# Patient Record
Sex: Female | Born: 1935 | Race: White | Hispanic: No | Marital: Married | Smoking: Never smoker
Health system: Southern US, Community
[De-identification: ages and names within clinical notes are randomized; demographics above are authoritative.]

## PROBLEM LIST (undated history)

## (undated) DIAGNOSIS — I340 Nonrheumatic mitral (valve) insufficiency: Secondary | ICD-10-CM

## (undated) DIAGNOSIS — I272 Pulmonary hypertension, unspecified: Secondary | ICD-10-CM

## (undated) DIAGNOSIS — Z9981 Dependence on supplemental oxygen: Secondary | ICD-10-CM

## (undated) DIAGNOSIS — K449 Diaphragmatic hernia without obstruction or gangrene: Secondary | ICD-10-CM

## (undated) DIAGNOSIS — K922 Gastrointestinal hemorrhage, unspecified: Secondary | ICD-10-CM

## (undated) DIAGNOSIS — I10 Essential (primary) hypertension: Secondary | ICD-10-CM

## (undated) DIAGNOSIS — B029 Zoster without complications: Secondary | ICD-10-CM

## (undated) DIAGNOSIS — J309 Allergic rhinitis, unspecified: Secondary | ICD-10-CM

## (undated) DIAGNOSIS — R011 Cardiac murmur, unspecified: Secondary | ICD-10-CM

## (undated) DIAGNOSIS — G8929 Other chronic pain: Secondary | ICD-10-CM

## (undated) DIAGNOSIS — M545 Low back pain, unspecified: Secondary | ICD-10-CM

## (undated) DIAGNOSIS — R809 Proteinuria, unspecified: Secondary | ICD-10-CM

## (undated) DIAGNOSIS — I739 Peripheral vascular disease, unspecified: Secondary | ICD-10-CM

## (undated) DIAGNOSIS — R200 Anesthesia of skin: Secondary | ICD-10-CM

## (undated) DIAGNOSIS — H04559 Acquired stenosis of unspecified nasolacrimal duct: Secondary | ICD-10-CM

## (undated) DIAGNOSIS — E785 Hyperlipidemia, unspecified: Secondary | ICD-10-CM

## (undated) DIAGNOSIS — M1990 Unspecified osteoarthritis, unspecified site: Secondary | ICD-10-CM

## (undated) DIAGNOSIS — I480 Paroxysmal atrial fibrillation: Secondary | ICD-10-CM

## (undated) DIAGNOSIS — Z8739 Personal history of other diseases of the musculoskeletal system and connective tissue: Secondary | ICD-10-CM

## (undated) DIAGNOSIS — R0602 Shortness of breath: Secondary | ICD-10-CM

## (undated) DIAGNOSIS — K529 Noninfective gastroenteritis and colitis, unspecified: Secondary | ICD-10-CM

## (undated) DIAGNOSIS — J189 Pneumonia, unspecified organism: Secondary | ICD-10-CM

## (undated) DIAGNOSIS — J449 Chronic obstructive pulmonary disease, unspecified: Secondary | ICD-10-CM

## (undated) DIAGNOSIS — I2510 Atherosclerotic heart disease of native coronary artery without angina pectoris: Secondary | ICD-10-CM

## (undated) DIAGNOSIS — Z8489 Family history of other specified conditions: Secondary | ICD-10-CM

## (undated) HISTORY — DX: Diaphragmatic hernia without obstruction or gangrene: K44.9

## (undated) HISTORY — DX: Anesthesia of skin: R20.0

## (undated) HISTORY — DX: Gastrointestinal hemorrhage, unspecified: K92.2

## (undated) HISTORY — PX: BACK SURGERY: SHX140

## (undated) HISTORY — PX: ABDOMINAL HYSTERECTOMY: SHX81

## (undated) HISTORY — DX: Nonrheumatic mitral (valve) insufficiency: I34.0

## (undated) HISTORY — DX: Paroxysmal atrial fibrillation: I48.0

## (undated) HISTORY — DX: Personal history of other diseases of the musculoskeletal system and connective tissue: Z87.39

## (undated) HISTORY — PX: TONSILLECTOMY AND ADENOIDECTOMY: SUR1326

## (undated) HISTORY — PX: EYE SURGERY: SHX253

## (undated) HISTORY — PX: CARDIAC CATHETERIZATION: SHX172

## (undated) HISTORY — DX: Zoster without complications: B02.9

## (undated) HISTORY — DX: Pulmonary hypertension, unspecified: I27.20

## (undated) HISTORY — PX: APPENDECTOMY: SHX54

## (undated) HISTORY — DX: Peripheral vascular disease, unspecified: I73.9

## (undated) HISTORY — DX: Allergic rhinitis, unspecified: J30.9

## (undated) HISTORY — DX: Proteinuria, unspecified: R80.9

## (undated) HISTORY — DX: Acquired stenosis of unspecified nasolacrimal duct: H04.559

## (undated) HISTORY — PX: LAPAROSCOPIC CHOLECYSTECTOMY: SUR755

## (undated) HISTORY — PX: LUMBAR DISC SURGERY: SHX700

## (undated) HISTORY — PX: CATARACT EXTRACTION, BILATERAL: SHX1313

---

## 1999-04-27 ENCOUNTER — Other Ambulatory Visit: Admission: RE | Admit: 1999-04-27 | Discharge: 1999-04-27 | Payer: Self-pay | Admitting: *Deleted

## 2000-06-03 ENCOUNTER — Other Ambulatory Visit: Admission: RE | Admit: 2000-06-03 | Discharge: 2000-06-03 | Payer: Self-pay | Admitting: *Deleted

## 2001-03-28 ENCOUNTER — Encounter: Admission: RE | Admit: 2001-03-28 | Discharge: 2001-03-28 | Payer: Self-pay | Admitting: Family Medicine

## 2001-03-28 ENCOUNTER — Encounter: Payer: Self-pay | Admitting: Family Medicine

## 2001-04-03 ENCOUNTER — Encounter: Payer: Self-pay | Admitting: Family Medicine

## 2001-04-03 ENCOUNTER — Encounter: Admission: RE | Admit: 2001-04-03 | Discharge: 2001-04-03 | Payer: Self-pay | Admitting: Family Medicine

## 2001-04-07 ENCOUNTER — Encounter: Admission: RE | Admit: 2001-04-07 | Discharge: 2001-04-07 | Payer: Self-pay | Admitting: Family Medicine

## 2001-04-07 ENCOUNTER — Encounter: Payer: Self-pay | Admitting: Family Medicine

## 2001-07-11 ENCOUNTER — Other Ambulatory Visit: Admission: RE | Admit: 2001-07-11 | Discharge: 2001-07-11 | Payer: Self-pay | Admitting: *Deleted

## 2001-11-01 ENCOUNTER — Encounter: Payer: Self-pay | Admitting: Family Medicine

## 2001-11-01 ENCOUNTER — Encounter: Admission: RE | Admit: 2001-11-01 | Discharge: 2001-11-01 | Payer: Self-pay | Admitting: Family Medicine

## 2001-11-15 ENCOUNTER — Encounter: Admission: RE | Admit: 2001-11-15 | Discharge: 2001-11-15 | Payer: Self-pay | Admitting: Family Medicine

## 2001-11-15 ENCOUNTER — Encounter: Payer: Self-pay | Admitting: Family Medicine

## 2002-07-31 ENCOUNTER — Encounter: Payer: Self-pay | Admitting: Family Medicine

## 2002-07-31 ENCOUNTER — Encounter: Admission: RE | Admit: 2002-07-31 | Discharge: 2002-07-31 | Payer: Self-pay | Admitting: Family Medicine

## 2003-04-24 ENCOUNTER — Ambulatory Visit (HOSPITAL_COMMUNITY): Admission: RE | Admit: 2003-04-24 | Discharge: 2003-04-24 | Payer: Self-pay | Admitting: Gastroenterology

## 2003-05-15 ENCOUNTER — Encounter: Payer: Self-pay | Admitting: Family Medicine

## 2003-05-15 ENCOUNTER — Encounter: Admission: RE | Admit: 2003-05-15 | Discharge: 2003-05-15 | Payer: Self-pay | Admitting: Family Medicine

## 2003-08-13 ENCOUNTER — Encounter: Payer: Self-pay | Admitting: Family Medicine

## 2003-08-13 ENCOUNTER — Encounter: Admission: RE | Admit: 2003-08-13 | Discharge: 2003-08-13 | Payer: Self-pay | Admitting: Family Medicine

## 2003-11-05 ENCOUNTER — Other Ambulatory Visit: Admission: RE | Admit: 2003-11-05 | Discharge: 2003-11-05 | Payer: Self-pay | Admitting: Obstetrics and Gynecology

## 2004-09-11 ENCOUNTER — Encounter: Admission: RE | Admit: 2004-09-11 | Discharge: 2004-09-11 | Payer: Self-pay | Admitting: Family Medicine

## 2005-04-29 ENCOUNTER — Encounter: Admission: RE | Admit: 2005-04-29 | Discharge: 2005-04-29 | Payer: Self-pay | Admitting: Family Medicine

## 2005-11-23 ENCOUNTER — Other Ambulatory Visit: Admission: RE | Admit: 2005-11-23 | Discharge: 2005-11-23 | Payer: Self-pay | Admitting: Obstetrics and Gynecology

## 2005-11-25 ENCOUNTER — Encounter: Admission: RE | Admit: 2005-11-25 | Discharge: 2005-11-25 | Payer: Self-pay | Admitting: Family Medicine

## 2006-05-05 ENCOUNTER — Encounter: Admission: RE | Admit: 2006-05-05 | Discharge: 2006-05-05 | Payer: Self-pay | Admitting: Family Medicine

## 2006-11-22 ENCOUNTER — Encounter: Admission: RE | Admit: 2006-11-22 | Discharge: 2006-11-22 | Payer: Self-pay | Admitting: Family Medicine

## 2006-12-01 ENCOUNTER — Encounter: Admission: RE | Admit: 2006-12-01 | Discharge: 2006-12-01 | Payer: Self-pay | Admitting: Obstetrics and Gynecology

## 2007-04-07 ENCOUNTER — Encounter: Admission: RE | Admit: 2007-04-07 | Discharge: 2007-04-07 | Payer: Self-pay | Admitting: Family Medicine

## 2008-01-04 ENCOUNTER — Encounter: Admission: RE | Admit: 2008-01-04 | Discharge: 2008-01-04 | Payer: Self-pay | Admitting: Obstetrics and Gynecology

## 2008-09-25 ENCOUNTER — Emergency Department (HOSPITAL_COMMUNITY): Admission: EM | Admit: 2008-09-25 | Discharge: 2008-09-25 | Payer: Self-pay | Admitting: Emergency Medicine

## 2008-09-30 ENCOUNTER — Encounter: Admission: RE | Admit: 2008-09-30 | Discharge: 2008-09-30 | Payer: Self-pay | Admitting: Family Medicine

## 2008-12-14 ENCOUNTER — Encounter: Admission: RE | Admit: 2008-12-14 | Discharge: 2008-12-14 | Payer: Self-pay | Admitting: Family Medicine

## 2009-01-19 ENCOUNTER — Inpatient Hospital Stay (HOSPITAL_COMMUNITY): Admission: EM | Admit: 2009-01-19 | Discharge: 2009-01-21 | Payer: Self-pay | Admitting: Emergency Medicine

## 2009-10-29 ENCOUNTER — Encounter: Admission: RE | Admit: 2009-10-29 | Discharge: 2009-10-29 | Payer: Self-pay | Admitting: Obstetrics and Gynecology

## 2010-12-27 ENCOUNTER — Encounter: Payer: Self-pay | Admitting: Obstetrics and Gynecology

## 2011-03-23 LAB — COMPREHENSIVE METABOLIC PANEL
ALT: 15 U/L (ref 0–35)
AST: 21 U/L (ref 0–37)
Albumin: 3.3 g/dL — ABNORMAL LOW (ref 3.5–5.2)
Alkaline Phosphatase: 57 U/L (ref 39–117)
BUN: 11 mg/dL (ref 6–23)
CO2: 27 mEq/L (ref 19–32)
Calcium: 8.8 mg/dL (ref 8.4–10.5)
Chloride: 104 mEq/L (ref 96–112)
Creatinine, Ser: 0.67 mg/dL (ref 0.4–1.2)
GFR calc Af Amer: >60 mL/min (ref >60)
GFR calc non Af Amer: >60 mL/min (ref >60)
Glucose, Bld: 100 mg/dL — ABNORMAL HIGH (ref 70–99)
Potassium: 3.8 mEq/L (ref 3.5–5.1)
Sodium: 139 mEq/L (ref 135–145)
Total Bilirubin: 0.9 mg/dL (ref 0.3–1.2)
Total Protein: 5.7 g/dL — ABNORMAL LOW (ref 6.0–8.3)

## 2011-03-23 LAB — URINALYSIS, ROUTINE W REFLEX MICROSCOPIC
Bilirubin Urine: NEGATIVE
Glucose, UA: NEGATIVE mg/dL
Hgb urine dipstick: NEGATIVE
Ketones, ur: NEGATIVE mg/dL
Nitrite: NEGATIVE
Protein, ur: NEGATIVE mg/dL
Specific Gravity, Urine: 1.013 (ref 1.005–1.030)
Urobilinogen, UA: 1 mg/dL (ref 0.0–1.0)
pH: 6.5 (ref 5.0–8.0)

## 2011-03-23 LAB — D-DIMER, QUANTITATIVE (NOT AT ARMC): D-Dimer, Quant: 0.3 ug/mL-FEU (ref 0.00–0.48)

## 2011-03-23 LAB — CBC
HCT: 39.8 % (ref 36.0–46.0)
Hemoglobin: 13.9 g/dL (ref 12.0–15.0)
MCHC: 34.9 g/dL (ref 30.0–36.0)
MCV: 97.1 fL (ref 78.0–100.0)
Platelets: 193 10*3/uL (ref 150–400)
RBC: 4.1 MIL/uL (ref 3.87–5.11)
RDW: 13.9 % (ref 11.5–15.5)
WBC: 10.3 10*3/uL (ref 4.0–10.5)

## 2011-03-23 LAB — DIFFERENTIAL
Basophils Absolute: 0 10*3/uL (ref 0.0–0.1)
Basophils Relative: 0 % (ref 0–1)
Eosinophils Absolute: 0.2 10*3/uL (ref 0.0–0.7)
Eosinophils Relative: 2 % (ref 0–5)
Lymphocytes Relative: 22 % (ref 12–46)
Lymphs Abs: 2.3 10*3/uL (ref 0.7–4.0)
Monocytes Absolute: 0.5 10*3/uL (ref 0.1–1.0)
Monocytes Relative: 5 % (ref 3–12)
Neutro Abs: 7.3 10*3/uL (ref 1.7–7.7)
Neutrophils Relative %: 71 % (ref 43–77)

## 2011-03-23 LAB — POCT I-STAT, CHEM 8
BUN: 21 mg/dL (ref 6–23)
Calcium, Ion: 1.13 mmol/L (ref 1.12–1.32)
Chloride: 104 mEq/L (ref 96–112)
Creatinine, Ser: 0.8 mg/dL (ref 0.4–1.2)
Glucose, Bld: 118 mg/dL — ABNORMAL HIGH (ref 70–99)
HCT: 43 % (ref 36.0–46.0)
Hemoglobin: 14.6 g/dL (ref 12.0–15.0)
Potassium: 3.8 mEq/L (ref 3.5–5.1)
Sodium: 138 mEq/L (ref 135–145)
TCO2: 27 mmol/L (ref 0–100)

## 2011-03-23 LAB — MAGNESIUM: Magnesium: 2 mg/dL (ref 1.5–2.5)

## 2011-03-23 LAB — BASIC METABOLIC PANEL
BUN: 16 mg/dL (ref 6–23)
CO2: 25 mEq/L (ref 19–32)
Calcium: 9.6 mg/dL (ref 8.4–10.5)
Chloride: 101 mEq/L (ref 96–112)
Creatinine, Ser: 0.7 mg/dL (ref 0.4–1.2)
GFR calc Af Amer: >60 mL/min (ref >60)
GFR calc non Af Amer: >60 mL/min (ref >60)
Glucose, Bld: 124 mg/dL — ABNORMAL HIGH (ref 70–99)
Potassium: 3.8 mEq/L (ref 3.5–5.1)
Sodium: 136 mEq/L (ref 135–145)

## 2011-03-23 LAB — CK TOTAL AND CKMB (NOT AT ARMC)
CK, MB: 1.1 ng/mL (ref 0.3–4.0)
Relative Index: INVALID (ref 0.0–2.5)
Total CK: 49 U/L (ref 7–177)

## 2011-03-23 LAB — CARDIAC PANEL(CRET KIN+CKTOT+MB+TROPI)
CK, MB: 1.1 ng/mL (ref 0.3–4.0)
CK, MB: 1.2 ng/mL (ref 0.3–4.0)
CK, MB: 1.1 ng/mL (ref 0.3–4.0)
Relative Index: INVALID (ref 0.0–2.5)
Relative Index: INVALID (ref 0.0–2.5)
Relative Index: INVALID (ref 0.0–2.5)
Total CK: 43 U/L (ref 7–177)
Total CK: 54 U/L (ref 7–177)
Total CK: 52 U/L (ref 7–177)
Troponin I: <0.01 ng/mL (ref 0.00–0.06)
Troponin I: <0.01 ng/mL (ref 0.00–0.06)
Troponin I: <0.01 ng/mL (ref 0.00–0.06)

## 2011-03-23 LAB — POCT CARDIAC MARKERS
CKMB, poc: <1 ng/mL — ABNORMAL LOW (ref 1.0–8.0)
CKMB, poc: <1 ng/mL — ABNORMAL LOW (ref 1.0–8.0)
Myoglobin, poc: 43.5 ng/mL (ref 12–200)
Myoglobin, poc: 45.4 ng/mL (ref 12–200)
Troponin i, poc: <0.05 ng/mL (ref 0.00–0.09)
Troponin i, poc: <0.05 ng/mL (ref 0.00–0.09)

## 2011-03-23 LAB — URINE CULTURE
Colony Count: 15000
Special Requests: NEGATIVE

## 2011-03-23 LAB — TROPONIN I: Troponin I: 0.01 ng/mL (ref 0.00–0.06)

## 2011-04-20 NOTE — H&P (Signed)
Sydney Gross, Sydney Gross             ACCOUNT NO.:  [id number]   MEDICAL RECORD NO.:  [id number]          PATIENT TYPE:  INP   LOCATION:  1823                         FACILITY:  [facility]   PHYSICIAN:  [name], M.D.DATE OF BIRTH:  24-Jul-1930   DATE OF ADMISSION:  01/19/2009  DATE OF DISCHARGE:                              HISTORY & PHYSICAL   PRIMARY CARE PHYSICIAN:  Dr. Donia Gross.   CHIEF COMPLAINT:  Chest pain and dizziness.   HISTORY OF PRESENT ILLNESS:  The patient is a 77 year old white female  with past medical history significant for hypertension, hyperlipidemia  who presents with above complaints.  She states that on the day prior to  admission she began having dizziness - described as a feeling of  unsteadiness that it is precipitated by turning her head.  She had  multiple episodes, about 10 episodes of nausea and vomiting, nonbloody,  throughout the day.  She had one episode of loose stool.  She denies  abdominal pain, dysuria, fevers, cough, melena and no hematochezia.  She  denies focal weakness, tinnitus, headaches, and no blurry vision.  She  also had palpitations, feeling like her heart was fluttering and  shortness of breath with exertion on the day prior to admission.  This  morning she states that she had three bites of oatmeal, and after  swallowing it felt like it just got stuck somewhere in her mid chest  area, and she had associated chest pain lasting about 15-20 minutes.  She was given nitroglycerin and following this the pain completely  resolved.  She also vomited following this episode.  She denies  radiation, and no paresthesias.  She states that she had a stress test  done likely by an [facility] cardiologist about 10-15 years ago and she was  told that it was normal.   Upon arrival in the ED she had an EKG which did not show any ischemic  changes, her point of care markers negative x1, and a chest x-ray was  done which shows no acute  cardiopulmonary disease, changes compatible  with COPD, and also scoliosis noted.  She is admitted for further  evaluation and management.   PAST MEDICAL HISTORY:  1. As above.  2. Arthritis.   MEDICATIONS:  1. Lipitor 40 mg p.o. daily.  2. Zetia 10 mg p.o. daily.  3. Metoprolol 25 mg daily.  4. Vitamin D 1000 units.  5. Skelaxin 800 mg t.i.d.  6. Welchol.   ALLERGIES:  CODEINE.   FAMILY HISTORY:  She denies tobacco, occasional cocktails.   FAMILY HISTORY:  Her mother had a stroke and her brother is deceased,  had liver and kidney cancer.  No family history of heart disease.   REVIEW OF SYSTEMS:  As per HPI, other review of systems negative.   PHYSICAL EXAM:  GENERAL:  The patient is a pleasant elderly white  female.  She is alert and oriented, in no apparent distress.  VITAL SIGNS: Her temperature is 98.3, blood pressure is 166/72,  initially 171/90, pulse is 73, respiratory rate is 22, O2 sat 99%.  HEENT: PERRL, EOMI, sclerae  anicteric, moist mucous membranes and no  oral exudates.  NECK:  Supple, no adenopathy and no thyromegaly, no JVD and no carotid  bruits appreciated.  LUNGS:  Clear to auscultation bilaterally.  No crackles or wheezes.  CARDIOVASCULAR: Regular rate and rhythm.  Normal S1-S2.  ABDOMEN:  Soft, bowel sounds present, nontender, nondistended.  No  organomegaly and no masses palpable.  EXTREMITIES:  No cyanosis and no edema.  NEURO:  She is alert and oriented x3.  Cranial nerves II-XII are grossly  intact.  Her strength is 4+/5 and symmetric, nonfocal exam.   LABORATORY DATA:  As per HPI.  Also her sodium is 138 with a potassium  of 3.8, chloride 104, BUN 21, creatinine 0.8, glucose is 118, ionized  calcium is 1.13. White cell count is 10.3 with a hemoglobin of 13.90,  hematocrit of 39.8.  The MCV is 97.1, platelet count 193, magnesium is  2.   ASSESSMENT AND PLAN:  1. Chest pain - as discussed above, gastrointestinal versus cardiac.      Will obtain  serial cardiac enzymes, also a barium swallow. We will      place on aspirin, nitroglycerin and a proton pump inhibitor.      Continue beta blocker.  Follow and consider cardiology consult      pending enzymes.  We will also monitor on telemetry for      arrhythmias.  2. ? Dysphagia/odynophagia - as discussed above.  Obtain a barium      swallow.  Consider a GI consult pending results.  3. Dizziness - described as unsteadiness with turning of her head.      Will obtain orthostatic vital signs, check CT scan of her head also      serial cardiac enzymes as above.  Antivert p.r.n.  For now, follow      and consult physical therapy for possible vestibular PT.  4. Hypertension - continue beta blocker.      [name], M.D.  Electronically Signed     ACV/MEDQ  D:  01/19/2009  T:  01/19/2009  Job:  [id number]   cc:   Sydney Gross, M.D.  Sydney Gross, M.D.

## 2011-04-23 NOTE — Op Note (Signed)
NAMECITLALLY, CAPTAIN                       ACCOUNT NO.:  [id number]   MEDICAL RECORD NO.:  [id number]                   PATIENT TYPE:  AMB   LOCATION:  ENDO                                 FACILITY:  [facility]   PHYSICIAN:  Anselmo Rod, M.D.               DATE OF BIRTH:  1930-03-26   DATE OF PROCEDURE:  04/24/2003  DATE OF DISCHARGE:                                 OPERATIVE REPORT   PROCEDURE PERFORMED:  Screening colonoscopy.   ENDOSCOPIST:  Anselmo Rod, M.D.   INSTRUMENT USED:  Pediatric adjustable Olympus colonoscope.   INDICATIONS FOR PROCEDURE:  A 77 year old white female undergoing screening  colonoscopy.  Rule out colonic polyps, masses, etc.   PREPROCEDURE PREPARATION:  Informed consent was procured from the patient.  The patient was fasted for eight hours prior to the procedure and prepped  with a bottle of MiraLax and Gatorade the night prior to the procedure.   PREPROCEDURE PHYSICAL:  VITAL SIGNS:  The patient had stable vital signs.  NECK:  Supple.  CHEST:  Clear to auscultation.  S1 and S2 regular.  ABDOMEN:  Soft with normal bowel sounds.   DESCRIPTION OF PROCEDURE:  The patient was placed in the left lateral  decubitus position and sedated with 80 mg of Demerol and 8 mg of Versed  intravenously.  Once the patient was adequately sedated and maintained on low flow oxygen  and continuous cardiac monitoring, the Olympus video colonoscope was  advanced from the rectum to the cecum without difficulty.  The entire  colonic mucosa appeared healthy with normal vascular pattern.  No masses,  polyps, erosions, ulcerations or diverticular were noted.  The appendiceal  orifice and ileocecal valve appeared healthy and were photographed.  The  terminal ileum was viewed and seemed normal.  Retroflexion in the rectum  revealed small nonbleeding internal hemorrhoids.   The patient dropped her oxygen saturation after the procedure was completed  and therefore her O2  was raised to 5 liters by nasal cannula and she was  given Romazicon 0.4 mg to reverse her sedation.  She responded well to the  Romazicon and was awakened appropriately responsive before she was brought  to the recovery area.  The patient tolerated the procedure well without  complications.   IMPRESSION:  Normal colonoscopy up to the terminal ileum except for small  nonbleeding internal hemorrhoids.   RECOMMENDATIONS:  1. Repeat colorectal cancer screening is recommended in next ten years     unless the patient develops any abnormal     symptoms in the interim.  2. High fiber diet with liberal fluid intake has been advised.  3. Outpatient follow up on a p.r.n. basis.  Anselmo Rod, M.D.    JNM/MEDQ  D:  04/24/2003  T:  04/24/2003  Job:  [id number]   cc:   Marcelino Duster L. Vincente Poli, M.D.  [address] [place]  [place] [address]  Fax: [phone]   Donia Guiles, M.D.  [address] [place]  [place] [address]  Fax: [phone]

## 2011-09-07 LAB — POCT I-STAT, CHEM 8
BUN: 9
Calcium, Ion: 1.19
Chloride: 103
Creatinine, Ser: 1
Glucose, Bld: 99
HCT: 39
Hemoglobin: 13.3
Potassium: 3.5
Sodium: 136
TCO2: 25

## 2011-09-07 LAB — URINALYSIS, ROUTINE W REFLEX MICROSCOPIC
Glucose, UA: NEGATIVE
Hgb urine dipstick: NEGATIVE
Ketones, ur: 40 — AB
Leukocytes, UA: NEGATIVE
Nitrite: NEGATIVE
Protein, ur: NEGATIVE
Specific Gravity, Urine: 1.029
Urobilinogen, UA: 0.2
pH: 6

## 2012-06-23 ENCOUNTER — Other Ambulatory Visit: Payer: Self-pay | Admitting: Family Medicine

## 2012-06-23 ENCOUNTER — Ambulatory Visit
Admission: RE | Admit: 2012-06-23 | Discharge: 2012-06-23 | Disposition: A | Discharge status: Home / Self Care | Payer: Medicare Other | Source: Ambulatory Visit | Attending: Family Medicine | Admitting: Family Medicine

## 2012-06-23 DIAGNOSIS — R519 Headache, unspecified: Secondary | ICD-10-CM

## 2012-06-23 DIAGNOSIS — M542 Cervicalgia: Secondary | ICD-10-CM

## 2012-06-23 MED ORDER — IOHEXOL 300 MG/ML  SOLN
75.0000 mL | Freq: Once | INTRAMUSCULAR | Status: AC | PRN
  Administered 2012-06-23: 75 mL via INTRAVENOUS

## 2012-09-13 ENCOUNTER — Other Ambulatory Visit (HOSPITAL_COMMUNITY): Payer: Self-pay | Admitting: Family Medicine

## 2012-09-13 DIAGNOSIS — R0602 Shortness of breath: Secondary | ICD-10-CM

## 2012-09-19 ENCOUNTER — Ambulatory Visit (HOSPITAL_COMMUNITY)
Admission: RE | Admit: 2012-09-19 | Discharge: 2012-09-19 | Disposition: A | Discharge status: Home / Self Care | Payer: Medicare Other | Source: Ambulatory Visit | Attending: Family Medicine | Admitting: Family Medicine

## 2012-09-19 DIAGNOSIS — R0602 Shortness of breath: Secondary | ICD-10-CM | POA: Insufficient documentation

## 2012-09-19 MED ORDER — ALBUTEROL SULFATE (5 MG/ML) 0.5% IN NEBU
2.5000 mg | INHALATION_SOLUTION | Freq: Once | RESPIRATORY_TRACT | Status: AC
  Administered 2012-09-19: 2.5 mg via RESPIRATORY_TRACT

## 2013-09-07 ENCOUNTER — Telehealth: Payer: Self-pay | Admitting: Interventional Cardiology

## 2013-09-07 DIAGNOSIS — E785 Hyperlipidemia, unspecified: Secondary | ICD-10-CM

## 2013-09-07 NOTE — Telephone Encounter (Signed)
New problem   Calling regarding lab test results.

## 2013-09-16 NOTE — Telephone Encounter (Signed)
LDL particle number >3500; small LDL particles >2300; particle size <20. Therefore recommended therapy with Crestor 5 mg daily. Repeat Lipomed analysis/Lipid NMR in 6-8weeks

## 2013-09-18 MED ORDER — ROSUVASTATIN CALCIUM 5 MG PO TABS: 5.0000 mg | ORAL_TABLET | Freq: Every day | ORAL | Status: DC

## 2013-09-18 NOTE — Telephone Encounter (Signed)
Pt given labs results and Dr.Smith instructions to start Crestor 5g by Orlene Plum, CMA. Pt sts that she is worried about leg cramps but she will try

## 2013-12-05 DIAGNOSIS — A0472 Enterocolitis due to Clostridium difficile, not specified as recurrent: Secondary | ICD-10-CM | POA: Insufficient documentation

## 2013-12-06 DIAGNOSIS — K529 Noninfective gastroenteritis and colitis, unspecified: Secondary | ICD-10-CM

## 2013-12-06 HISTORY — DX: Noninfective gastroenteritis and colitis, unspecified: K52.9

## 2013-12-09 ENCOUNTER — Encounter (HOSPITAL_COMMUNITY): Payer: Self-pay | Admitting: Emergency Medicine

## 2013-12-09 ENCOUNTER — Inpatient Hospital Stay (HOSPITAL_COMMUNITY)
Admission: EM | Admit: 2013-12-09 | Discharge: 2013-12-17 | DRG: 372 | Disposition: A | Discharge status: Home / Self Care | Payer: Medicare Other | Attending: Internal Medicine | Admitting: Internal Medicine

## 2013-12-09 ENCOUNTER — Emergency Department (HOSPITAL_COMMUNITY)
Admission: EM | Admit: 2013-12-09 | Discharge: 2013-12-09 | Disposition: A | Discharge status: Nursing Home (Includes ICF) | Payer: Medicare Other | Source: Home / Self Care | Attending: Family Medicine | Admitting: Family Medicine

## 2013-12-09 ENCOUNTER — Emergency Department (HOSPITAL_COMMUNITY): Payer: Medicare Other

## 2013-12-09 DIAGNOSIS — M5380 Other specified dorsopathies, site unspecified: Secondary | ICD-10-CM | POA: Diagnosis present

## 2013-12-09 DIAGNOSIS — K5289 Other specified noninfective gastroenteritis and colitis: Secondary | ICD-10-CM

## 2013-12-09 DIAGNOSIS — Z889 Allergy status to unspecified drugs, medicaments and biological substances status: Secondary | ICD-10-CM

## 2013-12-09 DIAGNOSIS — R059 Cough, unspecified: Secondary | ICD-10-CM

## 2013-12-09 DIAGNOSIS — M19019 Primary osteoarthritis, unspecified shoulder: Secondary | ICD-10-CM | POA: Diagnosis present

## 2013-12-09 DIAGNOSIS — Z7982 Long term (current) use of aspirin: Secondary | ICD-10-CM

## 2013-12-09 DIAGNOSIS — K529 Noninfective gastroenteritis and colitis, unspecified: Secondary | ICD-10-CM | POA: Diagnosis present

## 2013-12-09 DIAGNOSIS — Z79899 Other long term (current) drug therapy: Secondary | ICD-10-CM

## 2013-12-09 DIAGNOSIS — E8779 Other fluid overload: Secondary | ICD-10-CM | POA: Diagnosis not present

## 2013-12-09 DIAGNOSIS — R109 Unspecified abdominal pain: Secondary | ICD-10-CM

## 2013-12-09 DIAGNOSIS — R609 Edema, unspecified: Secondary | ICD-10-CM

## 2013-12-09 DIAGNOSIS — R600 Localized edema: Secondary | ICD-10-CM

## 2013-12-09 DIAGNOSIS — E876 Hypokalemia: Secondary | ICD-10-CM

## 2013-12-09 DIAGNOSIS — I10 Essential (primary) hypertension: Secondary | ICD-10-CM

## 2013-12-09 DIAGNOSIS — IMO0002 Reserved for concepts with insufficient information to code with codable children: Secondary | ICD-10-CM | POA: Diagnosis present

## 2013-12-09 DIAGNOSIS — E440 Moderate protein-calorie malnutrition: Secondary | ICD-10-CM

## 2013-12-09 DIAGNOSIS — R05 Cough: Secondary | ICD-10-CM

## 2013-12-09 DIAGNOSIS — B961 Klebsiella pneumoniae [K. pneumoniae] as the cause of diseases classified elsewhere: Secondary | ICD-10-CM | POA: Diagnosis present

## 2013-12-09 DIAGNOSIS — A0472 Enterocolitis due to Clostridium difficile, not specified as recurrent: Secondary | ICD-10-CM

## 2013-12-09 DIAGNOSIS — E785 Hyperlipidemia, unspecified: Secondary | ICD-10-CM

## 2013-12-09 DIAGNOSIS — N390 Urinary tract infection, site not specified: Secondary | ICD-10-CM

## 2013-12-09 DIAGNOSIS — R188 Other ascites: Secondary | ICD-10-CM | POA: Diagnosis not present

## 2013-12-09 HISTORY — DX: Hyperlipidemia, unspecified: E78.5

## 2013-12-09 HISTORY — DX: Shortness of breath: R06.02

## 2013-12-09 HISTORY — DX: Noninfective gastroenteritis and colitis, unspecified: K52.9

## 2013-12-09 HISTORY — DX: Unspecified osteoarthritis, unspecified site: M19.90

## 2013-12-09 HISTORY — DX: Family history of other specified conditions: Z84.89

## 2013-12-09 HISTORY — DX: Essential (primary) hypertension: I10

## 2013-12-09 HISTORY — DX: Chronic obstructive pulmonary disease, unspecified: J44.9

## 2013-12-09 LAB — CBC WITH DIFFERENTIAL/PLATELET
Basophils Absolute: 0 10*3/uL (ref 0.0–0.1)
Basophils Relative: 0 % (ref 0–1)
Eosinophils Absolute: 0.1 10*3/uL (ref 0.0–0.7)
Eosinophils Relative: 1 % (ref 0–5)
HCT: 43.3 % (ref 36.0–46.0)
Hemoglobin: 14.5 g/dL (ref 12.0–15.0)
Lymphocytes Relative: 15 % (ref 12–46)
Lymphs Abs: 2.7 10*3/uL (ref 0.7–4.0)
MCH: 32.4 pg (ref 26.0–34.0)
MCHC: 33.5 g/dL (ref 30.0–36.0)
MCV: 96.9 fL (ref 78.0–100.0)
Monocytes Absolute: 1.3 10*3/uL — ABNORMAL HIGH (ref 0.1–1.0)
Monocytes Relative: 7 % (ref 3–12)
Neutro Abs: 13.6 10*3/uL — ABNORMAL HIGH (ref 1.7–7.7)
Neutrophils Relative %: 77 % (ref 43–77)
Platelets: 313 10*3/uL (ref 150–400)
RBC: 4.47 MIL/uL (ref 3.87–5.11)
RDW: 12.8 % (ref 11.5–15.5)
WBC: 17.7 10*3/uL — ABNORMAL HIGH (ref 4.0–10.5)

## 2013-12-09 LAB — COMPREHENSIVE METABOLIC PANEL
ALT: 9 U/L (ref 0–35)
AST: 12 U/L (ref 0–37)
Albumin: 3.3 g/dL — ABNORMAL LOW (ref 3.5–5.2)
Alkaline Phosphatase: 88 U/L (ref 39–117)
BUN: 15 mg/dL (ref 6–23)
CO2: 26 mEq/L (ref 19–32)
Calcium: 9 mg/dL (ref 8.4–10.5)
Chloride: 96 mEq/L (ref 96–112)
Creatinine, Ser: 0.72 mg/dL (ref 0.50–1.10)
GFR calc Af Amer: >90 mL/min (ref >90)
GFR calc non Af Amer: 78 mL/min — ABNORMAL LOW (ref >90)
Glucose, Bld: 111 mg/dL — ABNORMAL HIGH (ref 70–99)
Potassium: 4.2 mEq/L (ref 3.7–5.3)
Sodium: 135 mEq/L — ABNORMAL LOW (ref 137–147)
Total Bilirubin: 0.3 mg/dL (ref 0.3–1.2)
Total Protein: 6.9 g/dL (ref 6.0–8.3)

## 2013-12-09 LAB — URINALYSIS, ROUTINE W REFLEX MICROSCOPIC
Glucose, UA: NEGATIVE mg/dL
Ketones, ur: 15 mg/dL — AB
Nitrite: POSITIVE — AB
Protein, ur: 30 mg/dL — AB
Specific Gravity, Urine: 1.029 (ref 1.005–1.030)
Urobilinogen, UA: 0.2 mg/dL (ref 0.0–1.0)
pH: 5.5 (ref 5.0–8.0)

## 2013-12-09 LAB — URINE MICROSCOPIC-ADD ON

## 2013-12-09 MED ORDER — IOHEXOL 300 MG/ML  SOLN
20.0000 mL | INTRAMUSCULAR | Status: AC
  Administered 2013-12-09: 25 mL via ORAL

## 2013-12-09 MED ORDER — MORPHINE SULFATE 2 MG/ML IJ SOLN
1.0000 mg | INTRAMUSCULAR | Status: DC | PRN
  Administered 2013-12-10 (×2): 1 mg via INTRAVENOUS
  Administered 2013-12-10 – 2013-12-13 (×8): 2 mg via INTRAVENOUS
  Administered 2013-12-14: 1 mg via INTRAVENOUS
  Administered 2013-12-14: 2 mg via INTRAVENOUS
  Administered 2013-12-15 (×2): 1 mg via INTRAVENOUS
  Administered 2013-12-15 – 2013-12-16 (×2): 2 mg via INTRAVENOUS
  Administered 2013-12-16: 1 mg via INTRAVENOUS
  Filled 2013-12-09 (×17): qty 1

## 2013-12-09 MED ORDER — CIPROFLOXACIN IN D5W 400 MG/200ML IV SOLN
400.0000 mg | Freq: Once | INTRAVENOUS | Status: AC
  Administered 2013-12-09: 400 mg via INTRAVENOUS
  Filled 2013-12-09: qty 200

## 2013-12-09 MED ORDER — SODIUM CHLORIDE 0.9 % IV BOLUS (SEPSIS)
1000.0000 mL | Freq: Once | INTRAVENOUS | Status: AC
  Administered 2013-12-09: 1000 mL via INTRAVENOUS

## 2013-12-09 MED ORDER — ONDANSETRON HCL 4 MG PO TABS: 4.0000 mg | ORAL_TABLET | Freq: Four times a day (QID) | ORAL | Status: DC | PRN

## 2013-12-09 MED ORDER — PANTOPRAZOLE SODIUM 40 MG IV SOLR
40.0000 mg | INTRAVENOUS | Status: DC
  Administered 2013-12-09 – 2013-12-10 (×2): 40 mg via INTRAVENOUS
  Filled 2013-12-09 (×4): qty 40

## 2013-12-09 MED ORDER — IOHEXOL 300 MG/ML  SOLN
100.0000 mL | Freq: Once | INTRAMUSCULAR | Status: AC | PRN
  Administered 2013-12-09: 100 mL via INTRAVENOUS

## 2013-12-09 MED ORDER — SODIUM CHLORIDE 0.9 % IV SOLN
INTRAVENOUS | Status: DC
Start: 2013-12-09 — End: 2013-12-10
  Administered 2013-12-09 – 2013-12-10 (×2): via INTRAVENOUS

## 2013-12-09 MED ORDER — DIPHENHYDRAMINE HCL 50 MG/ML IJ SOLN
25.0000 mg | Freq: Once | INTRAMUSCULAR | Status: AC
  Administered 2013-12-09: 25 mg via INTRAVENOUS
  Filled 2013-12-09: qty 1

## 2013-12-09 MED ORDER — METRONIDAZOLE IN NACL 5-0.79 MG/ML-% IV SOLN
500.0000 mg | Freq: Once | INTRAVENOUS | Status: AC
  Administered 2013-12-09: 500 mg via INTRAVENOUS
  Filled 2013-12-09: qty 100

## 2013-12-09 MED ORDER — ONDANSETRON HCL 4 MG/2ML IJ SOLN
4.0000 mg | Freq: Four times a day (QID) | INTRAMUSCULAR | Status: DC | PRN
  Administered 2013-12-10 – 2013-12-12 (×7): 4 mg via INTRAVENOUS
  Filled 2013-12-09 (×7): qty 2

## 2013-12-09 NOTE — ED Notes (Addendum)
Pt has redness localized to area above IV site. No infiltration noted, pt sts it is itchy as well. Not warm to touch. Dr. Ethelda ChickJacubowitz and pharmacy made aware. Per pharmacy slow rate after benadryl given. Rate changed to 12800ml/hr. Pt. Informed to let RN know if worsening symptoms.    Patient made aware of need for stool sample, importance of hand washing and Contact Precautions.

## 2013-12-09 NOTE — ED Notes (Signed)
Pt asked could obtain a stool sample. Pt stated that she couldn't at this time. Will follow up shortly.

## 2013-12-09 NOTE — ED Notes (Signed)
Harrie ForemanCheri George (daughter)  [phone]  Call with updates.

## 2013-12-09 NOTE — ED Provider Notes (Signed)
CSN: [id number]     Arrival date & time 12/09/13  1643 History   First MD Initiated Contact with Patient 12/09/13 1803     Chief Complaint  Patient presents with  . Abdominal Pain   (Consider location/radiation/quality/duration/timing/severity/associated sxs/prior Treatment) HPI Patient complains of abdominal pain gradual onset lower quadrants onset 1.5 weeks ago. She's had several episodes nonbloody diarrhea and dry heaves, treated with Pepto-Bismol and Imodium. [facility] to coming here. In here for further evaluation. Accompanies his lightheadedness and diminished appetite. No fever. She did have a cough and sore throat. approximately a which is steadily improved and I shortness of breath Past Medical History  Diagnosis Date  . Hyperlipidemia   . Hypertension    Past Surgical History  Procedure Laterality Date  . Cholecystectomy     cesarean section History reviewed. No pertinent family history. History  Substance Use Topics  . Smoking status: Never Smoker   . Smokeless tobacco: Not on file  . Alcohol Use: Yes     Comment: occasional   OB History   Grav Para Term Preterm Abortions TAB SAB Ect Mult Living                 Review of Systems  Constitutional: Positive for appetite change.  HENT: Positive for sore throat.   Respiratory: Positive for cough.        Almost gone  Gastrointestinal: Positive for nausea, vomiting, abdominal pain and diarrhea. Negative for blood in stool.  All other systems reviewed and are negative.    Allergies  Codeine  Home Medications   Current Outpatient Rx  Name  Route  Sig  Dispense  Refill  . aspirin EC 81 MG tablet   Oral   Take 81 mg by mouth daily.         . colesevelam (WELCHOL) 625 MG tablet   Oral   Take 1,875 mg by mouth 2 (two) times daily with a meal.         . ezetimibe (ZETIA) 10 MG tablet   Oral   Take 10 mg by mouth at bedtime.         [name]. lisinopril-hydrochlorothiazide  (PRINZIDE,ZESTORETIC) 20-25 MG per tablet   Oral   Take 1 tablet by mouth daily.         . rosuvastatin (CRESTOR) 5 MG tablet   Oral   Take 5 mg by mouth at bedtime.          BP 150/76  Pulse 93  Temp(Src) 97.9 F (36.6 C) (Oral)  Resp 16  SpO2 99% Physical Exam  Nursing note and vitals reviewed. Constitutional: She appears well-developed and well-nourished.  HENT:  Head: Normocephalic and atraumatic.  Eyes: Conjunctivae are normal. Pupils are equal, round, and reactive to light.  Neck: Neck supple. No tracheal deviation present. No thyromegaly present.  Cardiovascular: Normal rate and regular rhythm.   No murmur heard. Pulmonary/Chest: Effort normal and breath sounds normal.  Abdominal: Soft. Bowel sounds are normal. She exhibits distension. There is tenderness.  Mildly distended tender right lower quadrant and left lower quadrant  Musculoskeletal: Normal range of motion. She exhibits no edema and no tenderness.  Neurological: She is alert. Coordination normal.  Skin: Skin is warm and dry. No rash noted.  Psychiatric: She has a normal mood and affect.    ED Course  Procedures (including critical care time) Labs Review Labs Reviewed  CBC WITH DIFFERENTIAL - Abnormal; Notable for the following:  WBC 17.7 (*)    Neutro Abs 13.6 (*)    Monocytes Absolute 1.3 (*)    All other components within normal limits  COMPREHENSIVE METABOLIC PANEL - Abnormal; Notable for the following:    Sodium 135 (*)    Glucose, Bld 111 (*)    Albumin 3.3 (*)    GFR calc non Af Amer 78 (*)    All other components within normal limits  URINALYSIS, ROUTINE W REFLEX MICROSCOPIC   Imaging Review No results found.  EKG Interpretation   None       Results for orders placed during the hospital encounter of 12/09/13  CBC WITH DIFFERENTIAL      Result Value Range   WBC 17.7 (*) 4.0 - 10.5 K/uL   RBC 4.47  3.87 - 5.11 MIL/uL   Hemoglobin 14.5  12.0 - 15.0 g/dL   HCT 16.1  09.6 - 04.5  %   MCV 96.9  78.0 - 100.0 fL   MCH 32.4  26.0 - 34.0 pg   MCHC 33.5  30.0 - 36.0 g/dL   RDW 40.9  81.1 - 91.4 %   Platelets 313  150 - 400 K/uL   Neutrophils Relative % 77  43 - 77 %   Neutro Abs 13.6 (*) 1.7 - 7.7 K/uL   Lymphocytes Relative 15  12 - 46 %   Lymphs Abs 2.7  0.7 - 4.0 K/uL   Monocytes Relative 7  3 - 12 %   Monocytes Absolute 1.3 (*) 0.1 - 1.0 K/uL   Eosinophils Relative 1  0 - 5 %   Eosinophils Absolute 0.1  0.0 - 0.7 K/uL   Basophils Relative 0  0 - 1 %   Basophils Absolute 0.0  0.0 - 0.1 K/uL  COMPREHENSIVE METABOLIC PANEL      Result Value Range   Sodium 135 (*) 137 - 147 mEq/L   Potassium 4.2  3.7 - 5.3 mEq/L   Chloride 96  96 - 112 mEq/L   CO2 26  19 - 32 mEq/L   Glucose, Bld 111 (*) 70 - 99 mg/dL   BUN 15  6 - 23 mg/dL   Creatinine, Ser 7.82  0.50 - 1.10 mg/dL   Calcium 9.0  8.4 - 95.6 mg/dL   Total Protein 6.9  6.0 - 8.3 g/dL   Albumin 3.3 (*) 3.5 - 5.2 g/dL   AST 12  0 - 37 U/L   ALT 9  0 - 35 U/L   Alkaline Phosphatase 88  39 - 117 U/L   Total Bilirubin 0.3  0.3 - 1.2 mg/dL   GFR calc non Af Amer 78 (*) >90 mL/min   GFR calc Af Amer >90  >90 mL/min  URINALYSIS, ROUTINE W REFLEX MICROSCOPIC      Result Value Range   Color, Urine YELLOW  YELLOW   APPearance CLOUDY (*) CLEAR   Specific Gravity, Urine 1.029  1.005 - 1.030   pH 5.5  5.0 - 8.0   Glucose, UA NEGATIVE  NEGATIVE mg/dL   Hgb urine dipstick SMALL (*) NEGATIVE   Bilirubin Urine SMALL (*) NEGATIVE   Ketones, ur 15 (*) NEGATIVE mg/dL   Protein, ur 30 (*) NEGATIVE mg/dL   Urobilinogen, UA 0.2  0.0 - 1.0 mg/dL   Nitrite POSITIVE (*) NEGATIVE   Leukocytes, UA MODERATE (*) NEGATIVE  URINE MICROSCOPIC-ADD ON      Result Value Range   Squamous Epithelial / LPF RARE  RARE   WBC, UA  11-20  <3 WBC/hpf   RBC / HPF 3-6  <3 RBC/hpf   Bacteria, UA MANY (*) RARE   Ct Abdomen Pelvis W Contrast  12/09/2013   CLINICAL DATA:  Abdominal pain, nausea and vomiting for 8 days with diarrhea  EXAM: CT  ABDOMEN AND PELVIS WITH CONTRAST  TECHNIQUE: Multidetector CT imaging of the abdomen and pelvis was performed using the standard protocol following bolus administration of intravenous contrast.  CONTRAST:  OMNIPAQUE IOHEXOL 300 MG/ML  SOLN  COMPARISON:  None.  FINDINGS: Visualized lung bases clear.  Bone windows negative.  Liver normal. Gallbladder is surgically absent. Spleen, pancreas, and adrenal glands normal. 2 cm cyst lower pole right kidney. Mild to moderate left renal atrophy.  Calcification of the aorta with no dilatation. Bladder normal. Reproductive organs absent. Diffuse colon wall thickening. Moderate to severe wall thickening of the cecum and ascending colon. Moderate wall thickening of the transverse and proximal descending colon. Mild wall thickening of distal descending and sigmoid colon. Diverticulosis of the sigmoid colon seen incidentally. Small volume free fluid in the pelvis.  There is no evidence of pneumatosis. There is no free air or abscess.  IMPRESSION: Findings consistent with moderate to severe diffuse colitis. Although there is sigmoid colon diverticulosis, this is an incidental finding, and is not account for the diffuse colitis involving the entire large bowel.   Electronically Signed   By: Esperanza Heir M.D.   On: 12/09/2013 19:52      8:30 PM patient resting comfortably. Declined pain medicine.  9 PM patient developed slight redness and itching at IV site while intravenous Cipro infusing. I consult the pharmacist who suggested Benadryl 25 mg intravenously and infused Cipro at a slower rate. If she develops subsequent reactions , antibiotic will need to be changed. Results for orders placed during the hospital encounter of 12/09/13  CBC WITH DIFFERENTIAL      Result Value Range   WBC 17.7 (*) 4.0 - 10.5 K/uL   RBC 4.47  3.87 - 5.11 MIL/uL   Hemoglobin 14.5  12.0 - 15.0 g/dL   HCT 82.4  23.5 - 36.1 %   MCV 96.9  78.0 - 100.0 fL   MCH 32.4  26.0 - 34.0 pg   MCHC  33.5  30.0 - 36.0 g/dL   RDW 44.3  15.4 - 00.8 %   Platelets 313  150 - 400 K/uL   Neutrophils Relative % 77  43 - 77 %   Neutro Abs 13.6 (*) 1.7 - 7.7 K/uL   Lymphocytes Relative 15  12 - 46 %   Lymphs Abs 2.7  0.7 - 4.0 K/uL   Monocytes Relative 7  3 - 12 %   Monocytes Absolute 1.3 (*) 0.1 - 1.0 K/uL   Eosinophils Relative 1  0 - 5 %   Eosinophils Absolute 0.1  0.0 - 0.7 K/uL   Basophils Relative 0  0 - 1 %   Basophils Absolute 0.0  0.0 - 0.1 K/uL  COMPREHENSIVE METABOLIC PANEL      Result Value Range   Sodium 135 (*) 137 - 147 mEq/L   Potassium 4.2  3.7 - 5.3 mEq/L   Chloride 96  96 - 112 mEq/L   CO2 26  19 - 32 mEq/L   Glucose, Bld 111 (*) 70 - 99 mg/dL   BUN 15  6 - 23 mg/dL   Creatinine, Ser 6.76  0.50 - 1.10 mg/dL   Calcium 9.0  8.4 - 19.5 mg/dL  Total Protein 6.9  6.0 - 8.3 g/dL   Albumin 3.3 (*) 3.5 - 5.2 g/dL   AST 12  0 - 37 U/L   ALT 9  0 - 35 U/L   Alkaline Phosphatase 88  39 - 117 U/L   Total Bilirubin 0.3  0.3 - 1.2 mg/dL   GFR calc non Af Amer 78 (*) >90 mL/min   GFR calc Af Amer >90  >90 mL/min  URINALYSIS, ROUTINE W REFLEX MICROSCOPIC      Result Value Range   Color, Urine YELLOW  YELLOW   APPearance CLOUDY (*) CLEAR   Specific Gravity, Urine 1.029  1.005 - 1.030   pH 5.5  5.0 - 8.0   Glucose, UA NEGATIVE  NEGATIVE mg/dL   Hgb urine dipstick SMALL (*) NEGATIVE   Bilirubin Urine SMALL (*) NEGATIVE   Ketones, ur 15 (*) NEGATIVE mg/dL   Protein, ur 30 (*) NEGATIVE mg/dL   Urobilinogen, UA 0.2  0.0 - 1.0 mg/dL   Nitrite POSITIVE (*) NEGATIVE   Leukocytes, UA MODERATE (*) NEGATIVE  URINE MICROSCOPIC-ADD ON      Result Value Range   Squamous Epithelial / LPF RARE  RARE   WBC, UA 11-20  <3 WBC/hpf   RBC / HPF 3-6  <3 RBC/hpf   Bacteria, UA MANY (*) RARE   Ct Abdomen Pelvis W Contrast  12/09/2013   CLINICAL DATA:  Abdominal pain, nausea and vomiting for 8 days with diarrhea  EXAM: CT ABDOMEN AND PELVIS WITH CONTRAST  TECHNIQUE: Multidetector CT imaging  of the abdomen and pelvis was performed using the standard protocol following bolus administration of intravenous contrast.  CONTRAST:  OMNIPAQUE IOHEXOL 300 MG/ML  SOLN  COMPARISON:  None.  FINDINGS: Visualized lung bases clear.  Bone windows negative.  Liver normal. Gallbladder is surgically absent. Spleen, pancreas, and adrenal glands normal. 2 cm cyst lower pole right kidney. Mild to moderate left renal atrophy.  Calcification of the aorta with no dilatation. Bladder normal. Reproductive organs absent. Diffuse colon wall thickening. Moderate to severe wall thickening of the cecum and ascending colon. Moderate wall thickening of the transverse and proximal descending colon. Mild wall thickening of distal descending and sigmoid colon. Diverticulosis of the sigmoid colon seen incidentally. Small volume free fluid in the pelvis.  There is no evidence of pneumatosis. There is no free air or abscess.  IMPRESSION: Findings consistent with moderate to severe diffuse colitis. Although there is sigmoid colon diverticulosis, this is an incidental finding, and is not account for the diffuse colitis involving the entire large bowel.   Electronically Signed   By: Esperanza Heir M.D.   On: 12/09/2013 19:52    MDM  No diagnosis found. Case discussed with Dr. Allena Katz. Plan admit to medical surgical floor Plan intravenous antibiotics. Urine culture. Symptomatic control. Diagnosis #1 colitis #2 urinary tract infection   Doug Sou, MD 12/09/13 2112

## 2013-12-09 NOTE — ED Notes (Signed)
Pt c/o chills, nausea, diarrhea, and lower diffuse abdominal pain x8 days. Pt thought symptoms would go away but has been progressively getting worse. Pt has has increase in abdominal pain and sts last few days diarrhea has been "the color of mustard" pt has started vomiting yesterday and has had 6 episodes of dry heaves and has had small episodes of clear emesis.

## 2013-12-09 NOTE — ED Notes (Signed)
Pt back in room from CT. rehooked up to pulse ox and BP monitor.

## 2013-12-09 NOTE — ED Notes (Signed)
Assessment per Dr. Corey. 

## 2013-12-09 NOTE — ED Provider Notes (Signed)
Sydney Gross is a 80 y.o. female who presents to Urgent Care today for 8 days and significant nausea vomiting and diarrhea. Patient denies any blood in the diarrhea or vomiting. She additionally notes abdominal pain which has slowly been worsening. She reports multiple stools per day. She's tried Pepto-Bismol which has not helped. She denies any recent antibiotic exposure no medications.  She does note some weakness and dizziness. No chest pains palpitations or trouble breathing. No new medication changes.   No past medical history on file. History  Substance Use Topics  . Smoking status: Not on file  . Smokeless tobacco: Not on file  . Alcohol Use: Not on file   ROS as above Medications reviewed. No current facility-administered medications for this encounter.   Current Outpatient Prescriptions  Medication Sig Dispense Refill  . rosuvastatin (CRESTOR) 5 MG tablet Take 1 tablet (5 mg total) by mouth daily.        Exam:  BP 147/77  Pulse 100  Temp(Src) 99.3 F (37.4 C) (Oral)  Resp 18  SpO2 100% Gen: Ill-appearing HEENT: EOMI,  MMM Lungs: Normal work of breathing. CTABL Heart: RRR no MRG Abd: Hypoactive bowel sounds. bloated tender to palpation diffusely. Exts: Non edematous BL  LE, warm and well perfused.    Assessment and Plan: 80 y.o. female with gastroenteritis versus colitis. I am concerned for bacterial cause such as Clostridium difficile. Patient appears to be ill and I am worried about her ability to go home following workup and evaluation here in the urgent care.  I believe she needs an extensive workup IV fluids monitoring and possible admission. Plan this or the emergency room for evaluation and management via shuttle. Discussed warning signs or symptoms. Please see discharge instructions. Patient expresses understanding.      Rodolph BongEvan S Altamese Deguire, MD 12/09/13 [phone]

## 2013-12-09 NOTE — H&P (Signed)
[facility] History and Physical  Patient: Sydney Gross  LKG:[id number]  DOB: 1930-09-21  DOS: the patient was seen and examined on 12/09/2013 PCP: Lupita RaiderSHAW,KIMBERLEE, MD  Chief Complaint: Diarrhea vomiting and abdominal pain  HPI: Sydney Gross is a 80 y.o. female with Past medical history of hypertension and dyslipidemia. The patient is coming from home. The patient presents with complaints of abdominal pain that has been ongoing since last 10 days. She mentions that it started with complain of abdominal pain which was crampy in nature and later on started having episodes of diarrhea with 4-5 bowel movements initially and later on slowed down to 2-3 loose wall motions without any blood. She mentions that her abdominal pain with build up slowly and get better with a bowel movement. She has tried Pepto-Bismol but has not had. She denies any recent travel recent hospitalization recent antibiotic use or any other change in her medication. She mentions that she started having episode of flu around Christmas was resolving 3 days at that time she had sore throat fever chills cough. Yesterday she had vomiting which was bilious without any blood and today she had 4-5 episode of vomiting and dry heaving.Therefore she came to the ER   Review of Systems: as mentioned in the history of present illness.  A Comprehensive review of the other systems is negative.  Past Medical History  Diagnosis Date  . Hyperlipidemia   . Hypertension    Past Surgical History  Procedure Laterality Date  . Cholecystectomy     Social History:  reports that she has never smoked. She does not have any smokeless tobacco history on file. She reports that she drinks alcohol. She reports that she does not use illicit drugs. Independent for most of her  ADL.  Allergies  Allergen Reactions  . Codeine     GI upset    History reviewed. No pertinent family history.  Prior to Admission medications   Medication  Sig Start Date End Date Taking? Authorizing Provider  aspirin EC 81 MG tablet Take 81 mg by mouth daily.   Yes Historical Provider, MD  colesevelam (WELCHOL) 625 MG tablet Take 1,875 mg by mouth 2 (two) times daily with a meal.   Yes Historical Provider, MD  ezetimibe (ZETIA) 10 MG tablet Take 10 mg by mouth at bedtime.   Yes Historical Provider, MD  lisinopril-hydrochlorothiazide (PRINZIDE,ZESTORETIC) 20-25 MG per tablet Take 1 tablet by mouth daily.   Yes Historical Provider, MD  rosuvastatin (CRESTOR) 5 MG tablet Take 5 mg by mouth at bedtime.   Yes Historical Provider, MD    Physical Exam: Filed Vitals:   12/09/13 1947 12/09/13 2000 12/09/13 2031 12/09/13 2100  BP: 144/71 124/82 140/80 112/62  Pulse: 91 92 93 93  Temp:      TempSrc:      Resp: 16     SpO2: 99% 98% 100% 97%    General: Alert, Awake and Oriented to Time, Place and Person. Appear in moderate distress Eyes: PERRL ENT: Oral Mucosa clear dry. Neck: No JVD Cardiovascular: S1 and S2 Present, aortic systolic Murmur, Peripheral Pulses Present Respiratory: Bilateral Air entry equal and Decreased, Clear to Auscultation,  No Crackles, no wheezes Abdomen: Bowel Sound Present, Soft and diffusely tender, voluntry guarding no rigidity Skin: No Rash Extremities: No Pedal edema, no calf tenderness Neurologic: Grossly Unremarkable.  Labs on Admission:  CBC:  Recent Labs Lab 12/09/13 1653  WBC 17.7*  NEUTROABS 13.6*  HGB 14.5  HCT 43.3  MCV 96.9  PLT 313    CMP     Component Value Date/Time   NA 135* 12/09/2013 1653   K 4.2 12/09/2013 1653   CL 96 12/09/2013 1653   CO2 26 12/09/2013 1653   GLUCOSE 111* 12/09/2013 1653   BUN 15 12/09/2013 1653   CREATININE 0.72 12/09/2013 1653   CALCIUM 9.0 12/09/2013 1653   PROT 6.9 12/09/2013 1653   ALBUMIN 3.3* 12/09/2013 1653   AST 12 12/09/2013 1653   ALT 9 12/09/2013 1653   ALKPHOS 88 12/09/2013 1653   BILITOT 0.3 12/09/2013 1653   GFRNONAA 78* 12/09/2013 1653   GFRAA >90 12/09/2013 1653    No  results found for this basename: LIPASE, AMYLASE,  in the last 168 hours No results found for this basename: AMMONIA,  in the last 168 hours  No results found for this basename: CKTOTAL, CKMB, CKMBINDEX, TROPONINI,  in the last 168 hours BNP (last 3 results) No results found for this basename: PROBNP,  in the last 8760 hours  Radiological Exams on Admission: Ct Abdomen Pelvis W Contrast  12/09/2013   CLINICAL DATA:  Abdominal pain, nausea and vomiting for 8 days with diarrhea  EXAM: CT ABDOMEN AND PELVIS WITH CONTRAST  TECHNIQUE: Multidetector CT imaging of the abdomen and pelvis was performed using the standard protocol following bolus administration of intravenous contrast.  CONTRAST:  OMNIPAQUE IOHEXOL 300 MG/ML  SOLN  COMPARISON:  None.  FINDINGS: Visualized lung bases clear.  Bone windows negative.  Liver normal. Gallbladder is surgically absent. Spleen, pancreas, and adrenal glands normal. 2 cm cyst lower pole right kidney. Mild to moderate left renal atrophy.  Calcification of the aorta with no dilatation. Bladder normal. Reproductive organs absent. Diffuse colon wall thickening. Moderate to severe wall thickening of the cecum and ascending colon. Moderate wall thickening of the transverse and proximal descending colon. Mild wall thickening of distal descending and sigmoid colon. Diverticulosis of the sigmoid colon seen incidentally. Small volume free fluid in the pelvis.  There is no evidence of pneumatosis. There is no free air or abscess.  IMPRESSION: Findings consistent with moderate to severe diffuse colitis. Although there is sigmoid colon diverticulosis, this is an incidental finding, and is not account for the diffuse colitis involving the entire large bowel.   Electronically Signed   By: Esperanza Heir M.D.   On: 12/09/2013 19:52    Assessment/Plan Principal Problem:   Colitis Active Problems:   Hypertension   Dyslipidemia   Cough   1. Colitis The patient is presenting  with complaints of abdominal pain that has been ongoing since last 10 days worsening with diarrhea and vomiting. She denies any recent risk factors for C. Difficile. A CT scan of the abdomen is suggestive of extensive colitis without any diverticulitis. At present I will keep her n.p.o.. I would continue her on IV Cipro and Flagyl. I will check C. difficile and stool culture for further workup. There is a possibility that this could be a viral since she had flulike symptoms in the beginning. I will keep her on IV Zofran as needed, IV Protonix and IV morphine as needed for pain. No signs of obstruction or bowel ischemia noted on CT scan. Cipro) will also cover possible UTI I would continue her on gentle hydration with IV fluids  2. Hypertension At present I would hold her antihypertensive medications  3. Cough Her symptoms have resolved and lung examination is clear and she is not hypoxic I would continue  to monitor her  DVT Prophylaxis: subcutaneous Heparin Nutrition: N.p.o.  Code Status: Full  Family Communication: Husband was present at bedside, opportunity was given to ask question and all questions were answered satisfactorily at the time of interview. Disposition: Admitted t inpatient in med surge unit.  Author: Lynden Oxford, MD [facility] Pager: [phone] 12/09/2013, 9:39 PM    If 7PM-7AM, please contact night-coverage [email or web address] Password [email or web address]

## 2013-12-09 NOTE — ED Notes (Signed)
Redness at IV site has decreased but still some present. Pt denies itching sts feels "normal". Pt in NAD. Resting prior to RN arrival. Denies pain, SOB, n/v/d at present time.

## 2013-12-09 NOTE — ED Notes (Signed)
Pt unable to provide urine sample sts has not had very much to drink

## 2013-12-09 NOTE — ED Notes (Signed)
Pt sts no longer itching. Admitting dr. At bedside.

## 2013-12-09 NOTE — ED Notes (Signed)
Dr. Allena Katzpatel made aware of pt pain & request for PRN pain medications.

## 2013-12-09 NOTE — ED Notes (Signed)
Pt reports flu like symptoms for several days, started has sore throat, fever/chills, headache. And now also having abd pain, n/v/d. Mask on pt at triage.

## 2013-12-10 ENCOUNTER — Encounter (HOSPITAL_COMMUNITY): Payer: Self-pay | Admitting: General Practice

## 2013-12-10 ENCOUNTER — Inpatient Hospital Stay (HOSPITAL_COMMUNITY): Payer: Medicare Other

## 2013-12-10 DIAGNOSIS — N390 Urinary tract infection, site not specified: Secondary | ICD-10-CM

## 2013-12-10 DIAGNOSIS — E785 Hyperlipidemia, unspecified: Secondary | ICD-10-CM

## 2013-12-10 LAB — COMPREHENSIVE METABOLIC PANEL
ALT: 7 U/L (ref 0–35)
AST: 10 U/L (ref 0–37)
Albumin: 2.6 g/dL — ABNORMAL LOW (ref 3.5–5.2)
Alkaline Phosphatase: 69 U/L (ref 39–117)
BUN: 10 mg/dL (ref 6–23)
CO2: 25 mEq/L (ref 19–32)
Calcium: 7.9 mg/dL — ABNORMAL LOW (ref 8.4–10.5)
Chloride: 104 mEq/L (ref 96–112)
Creatinine, Ser: 0.66 mg/dL (ref 0.50–1.10)
GFR calc Af Amer: >90 mL/min (ref >90)
GFR calc non Af Amer: 81 mL/min — ABNORMAL LOW (ref >90)
Glucose, Bld: 103 mg/dL — ABNORMAL HIGH (ref 70–99)
Potassium: 3.9 mEq/L (ref 3.7–5.3)
Sodium: 139 mEq/L (ref 137–147)
Total Bilirubin: 0.3 mg/dL (ref 0.3–1.2)
Total Protein: 5.3 g/dL — ABNORMAL LOW (ref 6.0–8.3)

## 2013-12-10 LAB — PROTIME-INR
INR: 1 (ref 0.00–1.49)
Prothrombin Time: 13 seconds (ref 11.6–15.2)

## 2013-12-10 LAB — CBC
HCT: 36.8 % (ref 36.0–46.0)
Hemoglobin: 12.1 g/dL (ref 12.0–15.0)
MCH: 31.8 pg (ref 26.0–34.0)
MCHC: 32.9 g/dL (ref 30.0–36.0)
MCV: 96.8 fL (ref 78.0–100.0)
Platelets: 252 10*3/uL (ref 150–400)
RBC: 3.8 MIL/uL — ABNORMAL LOW (ref 3.87–5.11)
RDW: 12.9 % (ref 11.5–15.5)
WBC: 15.5 10*3/uL — ABNORMAL HIGH (ref 4.0–10.5)

## 2013-12-10 LAB — CLOSTRIDIUM DIFFICILE BY PCR: Toxigenic C. Difficile by PCR: POSITIVE — AB

## 2013-12-10 MED ORDER — ENOXAPARIN SODIUM 40 MG/0.4ML ~~LOC~~ SOLN
40.0000 mg | SUBCUTANEOUS | Status: DC
  Administered 2013-12-10 – 2013-12-17 (×7): 40 mg via SUBCUTANEOUS
  Filled 2013-12-10 (×11): qty 0.4

## 2013-12-10 MED ORDER — METRONIDAZOLE IN NACL 5-0.79 MG/ML-% IV SOLN
500.0000 mg | Freq: Three times a day (TID) | INTRAVENOUS | Status: DC
  Administered 2013-12-10 – 2013-12-14 (×12): 500 mg via INTRAVENOUS
  Filled 2013-12-10 (×15): qty 100

## 2013-12-10 MED ORDER — ASPIRIN EC 81 MG PO TBEC
81.0000 mg | DELAYED_RELEASE_TABLET | Freq: Every day | ORAL | Status: DC
  Administered 2013-12-10 – 2013-12-17 (×8): 81 mg via ORAL
  Filled 2013-12-10 (×9): qty 1

## 2013-12-10 MED ORDER — CIPROFLOXACIN IN D5W 400 MG/200ML IV SOLN
400.0000 mg | Freq: Two times a day (BID) | INTRAVENOUS | Status: DC
  Administered 2013-12-10 (×2): 400 mg via INTRAVENOUS
  Filled 2013-12-10 (×4): qty 200

## 2013-12-10 MED ORDER — POTASSIUM CHLORIDE IN NACL 20-0.9 MEQ/L-% IV SOLN
INTRAVENOUS | Status: DC
  Administered 2013-12-10 – 2013-12-11 (×2): via INTRAVENOUS
  Filled 2013-12-10 (×3): qty 1000

## 2013-12-10 MED ORDER — INFLUENZA VAC SPLIT QUAD 0.5 ML IM SUSP
0.5000 mL | INTRAMUSCULAR | Status: AC
  Administered 2013-12-11: 0.5 mL via INTRAMUSCULAR
  Filled 2013-12-10: qty 0.5

## 2013-12-10 NOTE — Progress Notes (Signed)
[facility] PROGRESS NOTE  Sydney Gross ZOX:[id number] DOB: 1930-06-29 DOA: 12/09/2013 PCP: Lupita Raider, MD  Assessment/Plan: 80 y.o. female with Past medical history of hypertension and dyslipidemia presents with complaints of abdominal pain that has been ongoing since last 10 days  1. Colitis r/o infectious enterocolitis; pend c diff; CT: Findings consistent with moderate to severe diffuse colitis -cont, IVF, antiemetics, IV atx; obtain GI panel;   2. UTI cont IV atx, pend c/s;  3. Leukocytosis likely UTI+GI related; cont IV atx;   4. HTN hold' home regimen; resume in AM  Code Status: full Family Communication: d/w husband, daughter at thebedside (indicate person spoken with, relationship, and if by phone, the number) Disposition Plan: home when improved    Consultants:  None   Procedures:  None   Antibiotics:  cipro 1/5<<<<  Flagyl 1/5<<<<   (indicate start date, and stop date if known)  HPI/Subjective: alert  Objective: Filed Vitals:   12/10/13 1000  BP: 115/62  Pulse: 80  Temp:   Resp:    No intake or output data in the 24 hours ending 12/10/13 1118 There were no vitals filed for this visit.  Exam:   General:  alert  Cardiovascular: s1,s2 rrr  Respiratory: CTA BL  Abdomen: soft, mild tender, no rebound   Musculoskeletal: no LE edema   Data Reviewed: Basic Metabolic Panel:  Recent Labs Lab 12/09/13 1653 12/10/13 0727  NA 135* 139  K 4.2 3.9  CL 96 104  CO2 26 25  GLUCOSE 111* 103*  BUN 15 10  CREATININE 0.72 0.66  CALCIUM 9.0 7.9*   Liver Function Tests:  Recent Labs Lab 12/09/13 1653 12/10/13 0727  AST 12 10  ALT 9 7  ALKPHOS 88 69  BILITOT 0.3 0.3  PROT 6.9 5.3*  ALBUMIN 3.3* 2.6*   No results found for this basename: LIPASE, AMYLASE,  in the last 168 hours No results found for this basename: AMMONIA,  in the last 168 hours CBC:  Recent Labs Lab 12/09/13 1653 12/10/13 0727  WBC 17.7* 15.5*   NEUTROABS 13.6*  --   HGB 14.5 12.1  HCT 43.3 36.8  MCV 96.9 96.8  PLT 313 252   Cardiac Enzymes: No results found for this basename: CKTOTAL, CKMB, CKMBINDEX, TROPONINI,  in the last 168 hours BNP (last 3 results) No results found for this basename: PROBNP,  in the last 8760 hours CBG: No results found for this basename: GLUCAP,  in the last 168 hours  No results found for this or any previous visit (from the past 240 hour(s)).   Studies: Ct Abdomen Pelvis W Contrast  12/09/2013   CLINICAL DATA:  Abdominal pain, nausea and vomiting for 8 days with diarrhea  EXAM: CT ABDOMEN AND PELVIS WITH CONTRAST  TECHNIQUE: Multidetector CT imaging of the abdomen and pelvis was performed using the standard protocol following bolus administration of intravenous contrast.  CONTRAST:  OMNIPAQUE IOHEXOL 300 MG/ML  SOLN  COMPARISON:  None.  FINDINGS: Visualized lung bases clear.  Bone windows negative.  Liver normal. Gallbladder is surgically absent. Spleen, pancreas, and adrenal glands normal. 2 cm cyst lower pole right kidney. Mild to moderate left renal atrophy.  Calcification of the aorta with no dilatation. Bladder normal. Reproductive organs absent. Diffuse colon wall thickening. Moderate to severe wall thickening of the cecum and ascending colon. Moderate wall thickening of the transverse and proximal descending colon. Mild wall thickening of distal descending and sigmoid colon. Diverticulosis of the sigmoid colon seen  incidentally. Small volume free fluid in the pelvis.  There is no evidence of pneumatosis. There is no free air or abscess.  IMPRESSION: Findings consistent with moderate to severe diffuse colitis. Although there is sigmoid colon diverticulosis, this is an incidental finding, and is not account for the diffuse colitis involving the entire large bowel.   Electronically Signed   By: Esperanza Heiraymond  Rubner M.D.   On: 12/09/2013 19:52    Scheduled Meds: . aspirin EC  81 mg Oral Daily  .  ciprofloxacin  400 mg Intravenous Q12H  . enoxaparin (LOVENOX) injection  40 mg Subcutaneous Q24H  . metronidazole  500 mg Intravenous Q8H  . pantoprazole (PROTONIX) IV  40 mg Intravenous Q24H   Continuous Infusions: . sodium chloride 75 mL/hr at 12/10/13 1106    Principal Problem:   Colitis Active Problems:   Hypertension   Dyslipidemia   Cough    Time spent: >35 minutes     Esperanza SheetsBURIEV, Santo Zahradnik N  [facility] Pager [phone]. If 7PM-7AM, please contact night-coverage at [email or web address], password [email or web address] 12/10/2013, 11:18 AM  LOS: 1 day

## 2013-12-10 NOTE — Progress Notes (Signed)
80yo female c/o flu-like Sx for a few days and now w/ abdominal pain and N/V/D, to begin IV ABX for colitis as seen on CT.  Will start Cipro 400mg  IV Q12H and monitor CBC and Cx.  Vernard GamblesVeronda Xavier Munger, PharmD, BCPS 12/10/2013 7:31 AM

## 2013-12-11 ENCOUNTER — Inpatient Hospital Stay (HOSPITAL_COMMUNITY): Payer: Medicare Other

## 2013-12-11 LAB — BASIC METABOLIC PANEL
BUN: 6 mg/dL (ref 6–23)
CO2: 24 mEq/L (ref 19–32)
Calcium: 8.3 mg/dL — ABNORMAL LOW (ref 8.4–10.5)
Chloride: 101 mEq/L (ref 96–112)
Creatinine, Ser: 0.62 mg/dL (ref 0.50–1.10)
GFR calc Af Amer: >90 mL/min (ref >90)
GFR calc non Af Amer: 82 mL/min — ABNORMAL LOW (ref >90)
Glucose, Bld: 94 mg/dL (ref 70–99)
Potassium: 3.9 mEq/L (ref 3.7–5.3)
Sodium: 136 mEq/L — ABNORMAL LOW (ref 137–147)

## 2013-12-11 LAB — GIARDIA/CRYPTOSPORIDIUM SCREEN(EIA)
Cryptosporidium Screen (EIA): NEGATIVE
Giardia Screen - EIA: NEGATIVE

## 2013-12-11 LAB — CBC
HCT: 37 % (ref 36.0–46.0)
Hemoglobin: 12.1 g/dL (ref 12.0–15.0)
MCH: 31.8 pg (ref 26.0–34.0)
MCHC: 32.7 g/dL (ref 30.0–36.0)
MCV: 97.1 fL (ref 78.0–100.0)
Platelets: 290 10*3/uL (ref 150–400)
RBC: 3.81 MIL/uL — ABNORMAL LOW (ref 3.87–5.11)
RDW: 13 % (ref 11.5–15.5)
WBC: 17 10*3/uL — ABNORMAL HIGH (ref 4.0–10.5)

## 2013-12-11 LAB — GI PATHOGEN PANEL BY PCR, STOOL
C difficile toxin A/B: NEGATIVE
Campylobacter by PCR: NEGATIVE
Cryptosporidium by PCR: NEGATIVE
E coli (ETEC) LT/ST: NEGATIVE
E coli (STEC): NEGATIVE
E coli 0157 by PCR: NEGATIVE
G lamblia by PCR: NEGATIVE
Norovirus GI/GII: NEGATIVE
Rotavirus A by PCR: NEGATIVE
Salmonella by PCR: NEGATIVE
Shigella by PCR: NEGATIVE

## 2013-12-11 LAB — URINE CULTURE: Colony Count: >=100000

## 2013-12-11 MED ORDER — KCL IN DEXTROSE-NACL 20-5-0.9 MEQ/L-%-% IV SOLN
INTRAVENOUS | Status: DC
  Administered 2013-12-11 – 2013-12-13 (×3): via INTRAVENOUS
  Administered 2013-12-13: 50 mL/h via INTRAVENOUS
  Administered 2013-12-15: 12:00:00 via INTRAVENOUS
  Filled 2013-12-11 (×7): qty 1000

## 2013-12-11 MED ORDER — VANCOMYCIN 50 MG/ML ORAL SOLUTION
125.0000 mg | Freq: Four times a day (QID) | ORAL | Status: DC
  Administered 2013-12-11 – 2013-12-17 (×26): 125 mg via ORAL
  Filled 2013-12-11 (×28): qty 2.5

## 2013-12-11 MED ORDER — PROMETHAZINE HCL 25 MG/ML IJ SOLN
12.5000 mg | Freq: Once | INTRAMUSCULAR | Status: AC
  Administered 2013-12-11: 12.5 mg via INTRAVENOUS
  Filled 2013-12-11: qty 1

## 2013-12-11 MED ORDER — CIPROFLOXACIN IN D5W 400 MG/200ML IV SOLN
400.0000 mg | Freq: Two times a day (BID) | INTRAVENOUS | Status: DC
  Administered 2013-12-11 – 2013-12-14 (×7): 400 mg via INTRAVENOUS
  Filled 2013-12-11 (×8): qty 200

## 2013-12-11 MED ORDER — BOOST / RESOURCE BREEZE PO LIQD
1.0000 | Freq: Three times a day (TID) | ORAL | Status: DC
  Administered 2013-12-11 – 2013-12-17 (×13): 1 via ORAL

## 2013-12-11 NOTE — Progress Notes (Addendum)
[facility] PROGRESS NOTE  Sydney Gross ZOX:[id number] DOB: 07/22/1930 DOA: 12/09/2013 PCP: Lupita Raider, MD  Assessment/Plan: 80 y.o. female with Past medical history of hypertension and dyslipidemia presents with complaints of abdominal pain that has been ongoing since last 10 days found to have c diff  1. C diff colitis; CT: Findings consistent with moderate to severe diffuse colitis; afebrile  -still diarrhea; cont flagyl IV; add PO vanc; cont, IVF, antiemetics, GI panel; d/c IV flagyl if able to take PO  2. UTI cont IV atx, pend c/s; blood c/s NGT  3. Leukocytosis likely UTI+GI related; cont IV atx;   4. HTN hold' home regimen; resume when stable   Code Status: full Family Communication: d/w husband, daughter at thebedside (indicate person spoken with, relationship, and if by phone, the number) Disposition Plan: home when improved    Consultants:  None   Procedures:  None   Antibiotics:  cipro 1/5<<<<  Flagyl 1/5<<<<   (indicate start date, and stop date if known)  HPI/Subjective: alert  Objective: Filed Vitals:   12/11/13 0646  BP: 113/56  Pulse: 85  Temp: 98.1 F (36.7 C)  Resp: 18    Intake/Output Summary (Last 24 hours) at 12/11/13 1034 Last data filed at 12/10/13 1847  Gross per 24 hour  Intake    790 ml  Output    200 ml  Net    590 ml   There were no vitals filed for this visit.  Exam:   General:  alert  Cardiovascular: s1,s2 rrr  Respiratory: CTA BL  Abdomen: soft, mild tender, no rebound   Musculoskeletal: no LE edema   Data Reviewed: Basic Metabolic Panel:  Recent Labs Lab 12/09/13 1653 12/10/13 0727 12/11/13 0545  NA 135* 139 136*  K 4.2 3.9 3.9  CL 96 104 101  CO2 26 25 24   GLUCOSE 111* 103* 94  BUN 15 10 6   CREATININE 0.72 0.66 0.62  CALCIUM 9.0 7.9* 8.3*   Liver Function Tests:  Recent Labs Lab 12/09/13 1653 12/10/13 0727  AST 12 10  ALT 9 7  ALKPHOS 88 69  BILITOT 0.3 0.3  PROT 6.9 5.3*   ALBUMIN 3.3* 2.6*   No results found for this basename: LIPASE, AMYLASE,  in the last 168 hours No results found for this basename: AMMONIA,  in the last 168 hours CBC:  Recent Labs Lab 12/09/13 1653 12/10/13 0727 12/11/13 0545  WBC 17.7* 15.5* 17.0*  NEUTROABS 13.6*  --   --   HGB 14.5 12.1 12.1  HCT 43.3 36.8 37.0  MCV 96.9 96.8 97.1  PLT 313 252 290   Cardiac Enzymes: No results found for this basename: CKTOTAL, CKMB, CKMBINDEX, TROPONINI,  in the last 168 hours BNP (last 3 results) No results found for this basename: PROBNP,  in the last 8760 hours CBG: No results found for this basename: GLUCAP,  in the last 168 hours  Recent Results (from the past 240 hour(s))  URINE CULTURE     Status: None   Collection Time    12/09/13  7:02 PM      Result Value Range Status   Specimen Description URINE, CLEAN CATCH   Final   Special Requests NONE   Final   Culture  Setup Time     Final   Value: 12/09/2013 19:41     Performed at [facility] Count     Final   Value: >=100,000 COLONIES/ML     Performed at  [facility]     Final   Value: GRAM NEGATIVE RODS     Performed at [facility]   Report Status PENDING   Incomplete  CLOSTRIDIUM DIFFICILE BY PCR     Status: Abnormal   Collection Time    12/09/13 11:58 PM      Result Value Range Status   C difficile by pcr POSITIVE (*) NEGATIVE Final   Comment: CRITICAL RESULT CALLED TO, READ BACK BY AND VERIFIED WITH:     BRANDON RN 11:55 12/10/13 ([email or web address])  STOOL CULTURE     Status: None   Collection Time    12/09/13 11:58 PM      Result Value Range Status   Specimen Description STOOL   Final   Special Requests NONE   Final   Culture     Final   Value: NO SUSPICIOUS COLONIES, CONTINUING TO HOLD     Note: REDUCED NORMAL FLORA PRESENT     Performed at [facility]   Report Status PENDING   Incomplete  CULTURE, BLOOD (ROUTINE X 2)     Status: None   Collection Time    12/10/13   1:10 PM      Result Value Range Status   Specimen Description BLOOD RIGHT HAND   Final   Special Requests BOTTLES DRAWN AEROBIC ONLY 5CC   Final   Culture  Setup Time     Final   Value: 12/10/2013 20:18     Performed at [facility]   Culture     Final   Value:        BLOOD CULTURE RECEIVED NO GROWTH TO DATE CULTURE WILL BE HELD FOR 5 DAYS BEFORE ISSUING A FINAL NEGATIVE REPORT     Performed at [facility]   Report Status PENDING   Incomplete  CULTURE, BLOOD (ROUTINE X 2)     Status: None   Collection Time    12/10/13  1:20 PM      Result Value Range Status   Specimen Description BLOOD LEFT ARM   Final   Special Requests BOTTLES DRAWN AEROBIC AND ANAEROBIC 10CC   Final   Culture  Setup Time     Final   Value: 12/10/2013 20:11     Performed at [facility]   Culture     Final   Value:        BLOOD CULTURE RECEIVED NO GROWTH TO DATE CULTURE WILL BE HELD FOR 5 DAYS BEFORE ISSUING A FINAL NEGATIVE REPORT     Performed at [facility]   Report Status PENDING   Incomplete     Studies: Ct Abdomen Pelvis W Contrast  12/09/2013   CLINICAL DATA:  Abdominal pain, nausea and vomiting for 8 days with diarrhea  EXAM: CT ABDOMEN AND PELVIS WITH CONTRAST  TECHNIQUE: Multidetector CT imaging of the abdomen and pelvis was performed using the standard protocol following bolus administration of intravenous contrast.  CONTRAST:  OMNIPAQUE IOHEXOL 300 MG/ML  SOLN  COMPARISON:  None.  FINDINGS: Visualized lung bases clear.  Bone windows negative.  Liver normal. Gallbladder is surgically absent. Spleen, pancreas, and adrenal glands normal. 2 cm cyst lower pole right kidney. Mild to moderate left renal atrophy.  Calcification of the aorta with no dilatation. Bladder normal. Reproductive organs absent. Diffuse colon wall thickening. Moderate to severe wall thickening of the cecum and ascending colon. Moderate wall thickening of the transverse and proximal descending colon.  Mild  wall thickening of distal descending and sigmoid colon. Diverticulosis of the sigmoid colon seen incidentally. Small volume free fluid in the pelvis.  There is no evidence of pneumatosis. There is no free air or abscess.  IMPRESSION: Findings consistent with moderate to severe diffuse colitis. Although there is sigmoid colon diverticulosis, this is an incidental finding, and is not account for the diffuse colitis involving the entire large bowel.   Electronically Signed   By: Esperanza Heiraymond  Rubner M.D.   On: 12/09/2013 19:52   Dg Chest Port 1 View  12/10/2013   CLINICAL DATA:  Cough and congestion.  EXAM: PORTABLE CHEST - 1 VIEW  COMPARISON:  09/13/2012.  FINDINGS: Scoliosis thoracic spine convex the left.  Heart size within normal limits.  Central pulmonary vascular prominence without pulmonary edema.  Scarring right lung base.  Apical pleural thickening without associated bony destruction is stable.  No segmental consolidation or pneumothorax.  Calcified mildly tortuous aorta.  IMPRESSION: Central pulmonary vascular prominence without pulmonary edema.  Scarring right lung base without segmental infiltrate.   Electronically Signed   By: Bridgett LarssonSteve  Olson M.D.   On: 12/10/2013 12:16    Scheduled Meds: . aspirin EC  81 mg Oral Daily  . enoxaparin (LOVENOX) injection  40 mg Subcutaneous Q24H  . metronidazole  500 mg Intravenous Q8H  . pantoprazole (PROTONIX) IV  40 mg Intravenous Q24H   Continuous Infusions: . 0.9 % NaCl with KCl 20 mEq / L 75 mL/hr at 12/11/13 [id number]    Principal Problem:   Colitis Active Problems:   Hypertension   Dyslipidemia   Cough    Time spent: >35 minutes     Esperanza SheetsBURIEV, Armine Rizzolo N  [facility] Pager [phone]. If 7PM-7AM, please contact night-coverage at [email or web address], password [email or web address] 12/11/2013, 10:34 AM  LOS: 2 days

## 2013-12-11 NOTE — Progress Notes (Signed)
INITIAL NUTRITION ASSESSMENT  DOCUMENTATION CODES Per approved criteria  -Non-severe (moderate) malnutrition in the context of acute illness or injury  Pt meets criteria for Moderate MALNUTRITION in the context of Acute Illness as evidenced by 4% weight loss in less than 2 weeks, estimated energy intake <75% of estimated energy needs for > 7 days and signs of mild fat and muscle wasting evidenced in physical exam.  INTERVENTION: Resource Breeze po TID until diet is advanced, each supplement provides 250 kcal and 9 grams of protein Diet advancement per MD discretion  NUTRITION DIAGNOSIS: Inadequate oral intake related to abdominal pain and decreased appetite as evidenced by pt's report of 4% weight loss in less than 2 weeks.   Goal: Pt to meet >/= 90% of their estimated nutrition needs   Monitor:  PO intake Diet advancement Weights Labs  Reason for Assessment: Malnutrition Screening Tool, score of 2  80 y.o. female  Admitting 27Dx: Colitis  ASSESSMENT: 80 y.o. female with Past medical history of hypertension and dyslipidemia presents with complaints of abdominal pain that has been ongoing since last 10 days found to have c diff.  Pt reports having abdominal pain and a poor appetite for 10 days PTA. She states she was eating either a few bites of rice or a half baked potato daily but, not much more, for the past 10 days. She reports weighing 125 lbs prior to getting sick and weighing 120 lbs just prior to admission. Pt had jello and a few sips of coffee for breakfast.   Nutrition Focused Physical Exam:  Subcutaneous Fat:  Orbital Region: wnl Upper Arm Region: mild wasting Thoracic and Lumbar Region: NA  Muscle:  Temple Region: mild wasting Clavicle Bone Region: mild wasting Clavicle and Acromion Bone Region: mild wasting Scapular Bone Region: NA Dorsal Hand: wnl Patellar Region: wnl Anterior Thigh Region: wnl Posterior Calf Region: wnl  Edema: none  Height: Ht  Readings from Last 1 Encounters:  No data found for Ht  Estimated height 60 inches  Weight: Wt Readings from Last 1 Encounters:  No data found for Wt  120 lbs per pt's report  Ideal Body Weight: 100 lbs  % Ideal Body Weight: 120% (based on pt report)  Wt Readings from Last 10 Encounters:  No data found for Wt    Usual Body Weight: 122-125 lbs  % Usual Body Weight: 96-98%  BMI:  There is no height or weight on file to calculate BMI.  Estimated Nutritional Needs: Kcal: 1300-1500 Protein: 60-70 grams Fluid: 1.5 L/day  Skin: WDL  Diet Order: Clear Liquid  EDUCATION NEEDS: -No education needs identified at this time   Intake/Output Summary (Last 24 hours) at 12/11/13 1256 Last data filed at 12/10/13 1847  Gross per 24 hour  Intake    790 ml  Output    200 ml  Net    590 ml    Last BM: 1/6   Labs:   Recent Labs Lab 12/09/13 1653 12/10/13 0727 12/11/13 0545  NA 135* 139 136*  K 4.2 3.9 3.9  CL 96 104 101  CO2 26 25 24   BUN 15 10 6   CREATININE 0.72 0.66 0.62  CALCIUM 9.0 7.9* 8.3*  GLUCOSE 111* 103* 94    CBG (last 3)  No results found for this basename: GLUCAP,  in the last 72 hours  Scheduled Meds: . aspirin EC  81 mg Oral Daily  . ciprofloxacin  400 mg Intravenous Q12H  . enoxaparin (LOVENOX) injection  40  mg Subcutaneous Q24H  . metronidazole  500 mg Intravenous Q8H    Continuous Infusions: . 0.9 % NaCl with KCl 20 mEq / L 75 mL/hr at 12/11/13 1610    Past Medical History  Diagnosis Date  . Hyperlipidemia   . Hypertension   . Colitis 12/2013    "never had this before"  . Family history of anesthesia complication     "mother; think they gave her too much; c/o pain after OR; gave her more RX; they had to bring her back real quick"  . COPD (chronic obstructive pulmonary disease)   . Exertional shortness of breath     "off and on" (12/10/2013)  . Arthritis     "maybe a little in my knees and back" (12/10/2013)    Past Surgical History   Procedure Laterality Date  . Tonsillectomy and adenoidectomy  ?1940's  . Cholecystectomy  ~ 2009  . Back surgery    . Lumbar disc surgery  ?2004  . Abdominal hysterectomy  1960's  . Cesarean section  1957; 1960    Ian Malkin RD, LDN Inpatient Clinical Dietitian Pager: [phone] After Hours Pager: [phone]

## 2013-12-11 NOTE — Progress Notes (Signed)
Pt. Not tolerating foods, complaining of pain in stomach which is causing nausea.  Pt. Complaining of distended abdomen.  MD notified.   Vanice Sarahhompson, Justise Ehmann L

## 2013-12-12 DIAGNOSIS — E440 Moderate protein-calorie malnutrition: Secondary | ICD-10-CM

## 2013-12-12 LAB — BASIC METABOLIC PANEL
BUN: 4 mg/dL — ABNORMAL LOW (ref 6–23)
CO2: 20 mEq/L (ref 19–32)
Calcium: 8.2 mg/dL — ABNORMAL LOW (ref 8.4–10.5)
Chloride: 104 mEq/L (ref 96–112)
Creatinine, Ser: 0.59 mg/dL (ref 0.50–1.10)
GFR calc Af Amer: >90 mL/min (ref >90)
GFR calc non Af Amer: 84 mL/min — ABNORMAL LOW (ref >90)
Glucose, Bld: 121 mg/dL — ABNORMAL HIGH (ref 70–99)
Potassium: 3.7 mEq/L (ref 3.7–5.3)
Sodium: 137 mEq/L (ref 137–147)

## 2013-12-12 LAB — CBC
HCT: 35.1 % — ABNORMAL LOW (ref 36.0–46.0)
Hemoglobin: 12.2 g/dL (ref 12.0–15.0)
MCH: 32.9 pg (ref 26.0–34.0)
MCHC: 34.8 g/dL (ref 30.0–36.0)
MCV: 94.6 fL (ref 78.0–100.0)
Platelets: 292 10*3/uL (ref 150–400)
RBC: 3.71 MIL/uL — ABNORMAL LOW (ref 3.87–5.11)
RDW: 12.8 % (ref 11.5–15.5)
WBC: 14.1 10*3/uL — ABNORMAL HIGH (ref 4.0–10.5)

## 2013-12-12 MED ORDER — ONDANSETRON HCL 4 MG PO TABS: 4.0000 mg | ORAL_TABLET | ORAL | Status: DC | PRN

## 2013-12-12 MED ORDER — HYDROCODONE-ACETAMINOPHEN 5-325 MG PO TABS: 1.0000 | ORAL_TABLET | ORAL | Status: DC | PRN

## 2013-12-12 MED ORDER — METHOCARBAMOL 500 MG PO TABS
500.0000 mg | ORAL_TABLET | Freq: Two times a day (BID) | ORAL | Status: DC | PRN
  Administered 2013-12-12 – 2013-12-16 (×5): 500 mg via ORAL
  Filled 2013-12-12 (×4): qty 1

## 2013-12-12 MED ORDER — ONDANSETRON HCL 4 MG/2ML IJ SOLN: 4.0000 mg | Freq: Four times a day (QID) | INTRAMUSCULAR | Status: DC | PRN

## 2013-12-12 MED ORDER — SACCHAROMYCES BOULARDII 250 MG PO CAPS
250.0000 mg | ORAL_CAPSULE | Freq: Two times a day (BID) | ORAL | Status: DC
  Administered 2013-12-12 – 2013-12-17 (×10): 250 mg via ORAL
  Filled 2013-12-12 (×11): qty 1

## 2013-12-12 NOTE — Progress Notes (Addendum)
[facility] PROGRESS NOTE  Sydney Gross ZOX:[id number] DOB: 02/08/1930 DOA: 12/09/2013 PCP: Lupita Raider, MD  Assessment/Plan: 80 y.o. female with Past medical history of hypertension and dyslipidemia presents with complaints of abdominal pain that has been ongoing since last 10 days found to have c diff  1. C diff colitis; CT: Findings consistent with moderate to severe diffuse colitis; afebrile  -Continue IV Flagyl and oral vancomycin -Abdominal x-rays from 1/6 with nonspecific gas pattern no evidence of ileus or obstruction or findings to suggest perforation. -Continue to monitor closely and if worsening distention of symptoms>> follow re image/reevaluate and further evaluate as appropriate. -She is tolerating sips of clears today, follow. 2 Kleb UTI- continue cipro  3. Leukocytosis likely UTI+GI related; cont IV atx;   4. HTN hold' home regimen; resume when stable   Code Status: full Family Communication: d/w husband, daughter at thebedside  Disposition Plan: home when improved    Consultants:  None   Procedures:  None   Antibiotics:  cipro 1/5<<<<  Flagyl 1/5<<<<   HPI/Subjective: Complaining of nausea, small amounts of mucousy diarrhea x3 so far today. C/o neck pain and states it feels like spasms  Objective: Filed Vitals:   12/12/13 1315  BP: 126/63  Pulse: 84  Temp: 98.9 F (37.2 C)  Resp: 19    Intake/Output Summary (Last 24 hours) at 12/12/13 1819 Last data filed at 12/12/13 8119  Gross per 24 hour  Intake   1100 ml  Output    500 ml  Net    600 ml   There were no vitals filed for this visit.  Exam:   General:  alert  Cardiovascular: s1,s2 rrr  Respiratory: CTA BL  Abdomen: soft, nontender, distended bowel sounds present, no rebound   Musculoskeletal: no LE edema   Data Reviewed: Basic Metabolic Panel:  Recent Labs Lab 12/09/13 1653 12/10/13 0727 12/11/13 0545 12/12/13 0530  NA 135* 139 136* 137  K 4.2 3.9 3.9  3.7  CL 96 104 101 104  CO2 26 25 24 20   GLUCOSE 111* 103* 94 121*  BUN 15 10 6  4*  CREATININE 0.72 0.66 0.62 0.59  CALCIUM 9.0 7.9* 8.3* 8.2*   Liver Function Tests:  Recent Labs Lab 12/09/13 1653 12/10/13 0727  AST 12 10  ALT 9 7  ALKPHOS 88 69  BILITOT 0.3 0.3  PROT 6.9 5.3*  ALBUMIN 3.3* 2.6*   No results found for this basename: LIPASE, AMYLASE,  in the last 168 hours No results found for this basename: AMMONIA,  in the last 168 hours CBC:  Recent Labs Lab 12/09/13 1653 12/10/13 0727 12/11/13 0545 12/12/13 0530  WBC 17.7* 15.5* 17.0* 14.1*  NEUTROABS 13.6*  --   --   --   HGB 14.5 12.1 12.1 12.2  HCT 43.3 36.8 37.0 35.1*  MCV 96.9 96.8 97.1 94.6  PLT 313 252 290 292   Cardiac Enzymes: No results found for this basename: CKTOTAL, CKMB, CKMBINDEX, TROPONINI,  in the last 168 hours BNP (last 3 results) No results found for this basename: PROBNP,  in the last 8760 hours CBG: No results found for this basename: GLUCAP,  in the last 168 hours  Recent Results (from the past 240 hour(s))  URINE CULTURE     Status: None   Collection Time    12/09/13  7:02 PM      Result Value Range Status   Specimen Description URINE, CLEAN CATCH   Final   Special Requests NONE  Final   Culture  Setup Time     Final   Value: 12/09/2013 19:41     Performed at [facility] Count     Final   Value: >=100,000 COLONIES/ML     Performed at [facility]   Culture     Final   Value: KLEBSIELLA PNEUMONIAE     Performed at [facility]   Report Status 12/11/2013 FINAL   Final   Organism ID, Bacteria KLEBSIELLA PNEUMONIAE   Final  CLOSTRIDIUM DIFFICILE BY PCR     Status: Abnormal   Collection Time    12/09/13 11:58 PM      Result Value Range Status   C difficile by pcr POSITIVE (*) NEGATIVE Final   Comment: CRITICAL RESULT CALLED TO, READ BACK BY AND VERIFIED WITH:     BRANDON RN 11:55 12/10/13 ([email or web address])  STOOL CULTURE     Status: None    Collection Time    12/09/13 11:58 PM      Result Value Range Status   Specimen Description STOOL   Final   Special Requests NONE   Final   Culture     Final   Value: NO SUSPICIOUS COLONIES, CONTINUING TO HOLD     Note: REDUCED NORMAL FLORA PRESENT     Performed at [facility]   Report Status PENDING   Incomplete  CULTURE, BLOOD (ROUTINE X 2)     Status: None   Collection Time    12/10/13  1:10 PM      Result Value Range Status   Specimen Description BLOOD RIGHT HAND   Final   Special Requests BOTTLES DRAWN AEROBIC ONLY 5CC   Final   Culture  Setup Time     Final   Value: 12/10/2013 20:18     Performed at [facility]   Culture     Final   Value:        BLOOD CULTURE RECEIVED NO GROWTH TO DATE CULTURE WILL BE HELD FOR 5 DAYS BEFORE ISSUING A FINAL NEGATIVE REPORT     Performed at [facility]   Report Status PENDING   Incomplete  CULTURE, BLOOD (ROUTINE X 2)     Status: None   Collection Time    12/10/13  1:20 PM      Result Value Range Status   Specimen Description BLOOD LEFT ARM   Final   Special Requests BOTTLES DRAWN AEROBIC AND ANAEROBIC 10CC   Final   Culture  Setup Time     Final   Value: 12/10/2013 20:11     Performed at [facility]   Culture     Final   Value:        BLOOD CULTURE RECEIVED NO GROWTH TO DATE CULTURE WILL BE HELD FOR 5 DAYS BEFORE ISSUING A FINAL NEGATIVE REPORT     Performed at [facility]   Report Status PENDING   Incomplete     Studies: Dg Abd 1 View  12/11/2013   CLINICAL DATA:  Ten day history of abdominal distention with diarrhea and nausea and vomiting  EXAM: ABDOMEN - 1 VIEW  COMPARISON:  CT scan of the abdomen and pelvis dated December 09, 2013  FINDINGS: The bowel gas pattern is nonspecific. There small amounts of small bowel gas throughout the abdomen and there is a small amount of gas and stool within the colon. There is gas in the rectum. No free extraluminal gas collections are  demonstrated. There is  curvature of the lumbar spine convex toward the right. There are surgical clips in the gallbladder fossa.  IMPRESSION: The bowel gas pattern is nonspecific. There is no evidence of ileus nor of obstruction nor findings to suggest perforation.   Electronically Signed   By: David  [place]   On: 12/11/2013 18:27    Scheduled Meds: . aspirin EC  81 mg Oral Daily  . ciprofloxacin  400 mg Intravenous Q12H  . enoxaparin (LOVENOX) injection  40 mg Subcutaneous Q24H  . feeding supplement (RESOURCE BREEZE)  1 Container Oral TID PC  . metronidazole  500 mg Intravenous Q8H  . vancomycin  125 mg Oral Q6H   Continuous Infusions: . dextrose 5 % and 0.9 % NaCl with KCl 20 mEq/L 75 mL/hr at 12/12/13 0607    Principal Problem:   Colitis Active Problems:   Hypertension   Dyslipidemia   Cough   Malnutrition of moderate degree    Time spent: 35 minutes     Shavon Zenz C  [facility] Pager [phone]. If 7PM-7AM, please contact night-coverage at [email or web address], password [facility] 12/12/2013, 6:19 PM  LOS: 3 days

## 2013-12-13 ENCOUNTER — Inpatient Hospital Stay (HOSPITAL_COMMUNITY): Payer: Medicare Other

## 2013-12-13 ENCOUNTER — Encounter (HOSPITAL_COMMUNITY): Payer: Self-pay | Admitting: Radiology

## 2013-12-13 LAB — URIC ACID: Uric Acid, Serum: 3.6 mg/dL (ref 2.4–7.0)

## 2013-12-13 MED ORDER — KETOROLAC TROMETHAMINE 30 MG/ML IJ SOLN
30.0000 mg | Freq: Once | INTRAMUSCULAR | Status: AC
  Administered 2013-12-13: 30 mg via INTRAVENOUS
  Filled 2013-12-13: qty 1

## 2013-12-13 MED ORDER — IOHEXOL 300 MG/ML  SOLN
100.0000 mL | Freq: Once | INTRAMUSCULAR | Status: AC | PRN
  Administered 2013-12-13: 100 mL via INTRAVENOUS

## 2013-12-13 MED ORDER — IOHEXOL 300 MG/ML  SOLN
25.0000 mL | INTRAMUSCULAR | Status: AC
  Administered 2013-12-13 (×2): 25 mL via ORAL

## 2013-12-13 NOTE — Progress Notes (Signed)
[facility] PROGRESS NOTE  Sydney Gross ZOX:[id number] DOB: [birth year] DOA: 12/09/2013 PCP: Lupita RaiderSHAW,KIMBERLEE, MD  Assessment/Plan: 80 y.o. female with Past medical history of hypertension and dyslipidemia presents with complaints of abdominal pain that has been ongoing since last 10 days found to have c diff  1. C diff colitis; CT: Findings consistent with moderate to severe diffuse colitis; afebrile  -Continue IV Flagyl and oral vancomycin -Abdominal x-rays from 1/6 with nonspecific gas pattern no evidence of ileus or obstruction or findings to suggest perforation. -Abdomen appears more distended today though she reports large amount of stool this am, and is only able to tolerate small amounts of clears -Will repeat CT scan of the abdomen to further evaluate>> follow and further manage accordingly  2 Kleb UTI- on cipro and given she already has C. difficile will DC Cipro after completing 5 days-1/9 and follow  3. Leukocytosis likely UTI+GI related; cont IV atx;  -Follow and recheck in a.m. 4. HTN hold' home regimen; resume when stable  5. Neck and left upper extremity pain -Obtain cervical MRI to further evaluate -Continue pain management -Left shoulder x-ray shows arthritis Code Status: full Family Communication: d/w daughter at thebedside  Disposition Plan: home when improved    Consultants:  None   Procedures:  None   Antibiotics:  cipro 1/5>>1/9  Oral vanc 1/6>>  Flagyl 1/5>>   HPI/Subjective: Complaining of nausea, small amounts of mucousy diarrhea x3 so far today. C/o neck pain(feels like spasms) and states going down to her left arm today. She denies paresthesias and also denies any arm weakness  Objective: Filed Vitals:   12/13/13 0606  BP: 132/68  Pulse: 85  Temp: 98.8 F (37.1 C)  Resp: 20    Intake/Output Summary (Last 24 hours) at 12/13/13 1601 Last data filed at 12/12/13 2202  Gross per 24 hour  Intake    650 ml  Output      0 ml  Net     650 ml   Filed Weights   12/13/13 1159  Weight: 63.3 kg (139 lb 8.8 oz)    Exam:   General:  alert and oriented x3  HEENT: Tender over cervical spine, range of movement limited by pain  Cardiovascular: s1,s2 RRR  Respiratory: CTA BL  Abdomen: soft, nontender, distended bowel sounds present, no rebound   Extremities: Tender over her left shoulder,LUE range of movement limited by pain. No focal weakness-no LE edema   Data Reviewed: Basic Metabolic Panel:  Recent Labs Lab 12/09/13 1653 12/10/13 0727 12/11/13 0545 12/12/13 0530  NA 135* 139 136* 137  K 4.2 3.9 3.9 3.7  CL 96 104 101 104  CO2 26 25 24 20   GLUCOSE 111* 103* 94 121*  BUN 15 10 6  4*  CREATININE 0.72 0.66 0.62 0.59  CALCIUM 9.0 7.9* 8.3* 8.2*   Liver Function Tests:  Recent Labs Lab 12/09/13 1653 12/10/13 0727  AST 12 10  ALT 9 7  ALKPHOS 88 69  BILITOT 0.3 0.3  PROT 6.9 5.3*  ALBUMIN 3.3* 2.6*   No results found for this basename: LIPASE, AMYLASE,  in the last 168 hours No results found for this basename: AMMONIA,  in the last 168 hours CBC:  Recent Labs Lab 12/09/13 1653 12/10/13 0727 12/11/13 0545 12/12/13 0530  WBC 17.7* 15.5* 17.0* 14.1*  NEUTROABS 13.6*  --   --   --   HGB 14.5 12.1 12.1 12.2  HCT 43.3 36.8 37.0 35.1*  MCV 96.9 96.8 97.1 94.6  PLT 313 252 290 292   Cardiac Enzymes: No results found for this basename: CKTOTAL, CKMB, CKMBINDEX, TROPONINI,  in the last 168 hours BNP (last 3 results) No results found for this basename: PROBNP,  in the last 8760 hours CBG: No results found for this basename: GLUCAP,  in the last 168 hours  Recent Results (from the past 240 hour(s))  URINE CULTURE     Status: None   Collection Time    12/09/13  7:02 PM      Result Value Range Status   Specimen Description URINE, CLEAN CATCH   Final   Special Requests NONE   Final   Culture  Setup Time     Final   Value: 12/09/2013 19:41     Performed at [facility]  Count     Final   Value: >=100,000 COLONIES/ML     Performed at [facility]   Culture     Final   Value: KLEBSIELLA PNEUMONIAE     Performed at [facility]   Report Status 12/11/2013 FINAL   Final   Organism ID, Bacteria KLEBSIELLA PNEUMONIAE   Final  CLOSTRIDIUM DIFFICILE BY PCR     Status: Abnormal   Collection Time    12/09/13 11:58 PM      Result Value Range Status   C difficile by pcr POSITIVE (*) NEGATIVE Final   Comment: CRITICAL RESULT CALLED TO, READ BACK BY AND VERIFIED WITH:     BRANDON RN 11:55 12/10/13 ([email or web address])  STOOL CULTURE     Status: None   Collection Time    12/09/13 11:58 PM      Result Value Range Status   Specimen Description STOOL   Final   Special Requests NONE   Final   Culture     Final   Value: NO SUSPICIOUS COLONIES, CONTINUING TO HOLD     Note: REDUCED NORMAL FLORA PRESENT     Performed at [facility]   Report Status PENDING   Incomplete  CULTURE, BLOOD (ROUTINE X 2)     Status: None   Collection Time    12/10/13  1:10 PM      Result Value Range Status   Specimen Description BLOOD RIGHT HAND   Final   Special Requests BOTTLES DRAWN AEROBIC ONLY 5CC   Final   Culture  Setup Time     Final   Value: 12/10/2013 20:18     Performed at [facility]   Culture     Final   Value:        BLOOD CULTURE RECEIVED NO GROWTH TO DATE CULTURE WILL BE HELD FOR 5 DAYS BEFORE ISSUING A FINAL NEGATIVE REPORT     Performed at [facility]   Report Status PENDING   Incomplete  CULTURE, BLOOD (ROUTINE X 2)     Status: None   Collection Time    12/10/13  1:20 PM      Result Value Range Status   Specimen Description BLOOD LEFT ARM   Final   Special Requests BOTTLES DRAWN AEROBIC AND ANAEROBIC 10CC   Final   Culture  Setup Time     Final   Value: 12/10/2013 20:11     Performed at [facility]   Culture     Final   Value:        BLOOD CULTURE RECEIVED NO GROWTH TO DATE CULTURE WILL BE HELD FOR 5 DAYS BEFORE ISSUING A  FINAL NEGATIVE REPORT     Performed at [facility]   Report Status PENDING   Incomplete     Studies: Dg Abd 1 View  12/11/2013   CLINICAL DATA:  Ten day history of abdominal distention with diarrhea and nausea and vomiting  EXAM: ABDOMEN - 1 VIEW  COMPARISON:  CT scan of the abdomen and pelvis dated December 09, 2013  FINDINGS: The bowel gas pattern is nonspecific. There small amounts of small bowel gas throughout the abdomen and there is a small amount of gas and stool within the colon. There is gas in the rectum. No free extraluminal gas collections are demonstrated. There is curvature of the lumbar spine convex toward the right. There are surgical clips in the gallbladder fossa.  IMPRESSION: The bowel gas pattern is nonspecific. There is no evidence of ileus nor of obstruction nor findings to suggest perforation.   Electronically Signed   By: David  [place]   On: 12/11/2013 18:27   Dg Shoulder Left Port  12/13/2013   CLINICAL DATA:  Left shoulder pain  EXAM: PORTABLE LEFT SHOULDER - 2+ VIEW  COMPARISON:  None.  FINDINGS: There is no evidence of fracture or dislocation. Mild osteoarthritis involves the AC joint. Soft tissues are unremarkable.  IMPRESSION: 1. No acute findings. 2. Mild AC joint osteoarthritis.   Electronically Signed   By: Signa Kell M.D.   On: 12/13/2013 11:04    Scheduled Meds: . aspirin EC  81 mg Oral Daily  . ciprofloxacin  400 mg Intravenous Q12H  . enoxaparin (LOVENOX) injection  40 mg Subcutaneous Q24H  . feeding supplement (RESOURCE BREEZE)  1 Container Oral TID PC  . metronidazole  500 mg Intravenous Q8H  . saccharomyces boulardii  250 mg Oral BID  . vancomycin  125 mg Oral Q6H   Continuous Infusions: . dextrose 5 % and 0.9 % NaCl with KCl 20 mEq/L 75 mL/hr at 12/13/13 0401    Principal Problem:   Colitis Active Problems:   Hypertension   Dyslipidemia   Cough   Malnutrition of moderate degree    Time spent: 35 minutes     Lielle Vandervort  C  [facility] Pager [phone]. If 7PM-7AM, please contact night-coverage at [email or web address], password [facility] 12/13/2013, 4:01 PM  LOS: 4 days

## 2013-12-13 NOTE — Progress Notes (Signed)
ANTIBIOTIC CONSULT NOTE - FOLLOW UP  Pharmacy Consult for Ciprofloxacin Indication: Klebsiella UTI  Allergies  Allergen Reactions  . Codeine     GI upset    Patient Measurements:    Vital Signs: Temp: 98.8 F (37.1 C) (01/08 0606) Temp src: Oral (01/08 0606) BP: 132/68 mmHg (01/08 0606) Pulse Rate: 85 (01/08 0606) Intake/Output from previous day: 01/07 0701 - 01/08 0700 In: 650 [I.V.:650] Out: -  Intake/Output from this shift:    Labs:  Recent Labs  12/11/13 0545 12/12/13 0530  WBC 17.0* 14.1*  HGB 12.1 12.2  PLT 290 292  CREATININE 0.62 0.59   CrCl is unknown because there is no height on file for the current visit. No results found for this basename: VANCOTROUGH, Leodis BinetVANCOPEAK, VANCORANDOM, GENTTROUGH, GENTPEAK, GENTRANDOM, TOBRATROUGH, TOBRAPEAK, TOBRARND, AMIKACINPEAK, AMIKACINTROU, AMIKACIN,  in the last 72 hours   Microbiology: Recent Results (from the past 720 hour(s))  URINE CULTURE     Status: None   Collection Time    12/09/13  7:02 PM      Result Value Range Status   Specimen Description URINE, CLEAN CATCH   Final   Special Requests NONE   Final   Culture  Setup Time     Final   Value: 12/09/2013 19:41     [facility]   Colony Count     Final   Value: >=100,000 COLONIES/ML     [facility]   Culture     Final   Value: KLEBSIELLA PNEUMONIAE     [facility]   Report Status 12/11/2013 FINAL   Final   Organism ID, Bacteria KLEBSIELLA PNEUMONIAE   Final  CLOSTRIDIUM DIFFICILE BY PCR     Status: Abnormal   Collection Time    12/09/13 11:58 PM      Result Value Range Status   C difficile by pcr POSITIVE (*) NEGATIVE Final   Comment: CRITICAL RESULT CALLED TO, READ BACK BY AND VERIFIED WITH:     BRANDON RN 11:55 12/10/13 ([email or web address])  STOOL CULTURE     Status: None   Collection Time    12/09/13 11:58 PM      Result Value Range Status   Specimen Description STOOL   Final   Special  Requests NONE   Final   Culture     Final   Value: NO SUSPICIOUS COLONIES, CONTINUING TO HOLD     Note: REDUCED NORMAL FLORA PRESENT     [facility]   Report Status PENDING   Incomplete  CULTURE, BLOOD (ROUTINE X 2)     Status: None   Collection Time    12/10/13  1:10 PM      Result Value Range Status   Specimen Description BLOOD RIGHT HAND   Final   Special Requests BOTTLES DRAWN AEROBIC ONLY 5CC   Final   Culture  Setup Time     Final   Value: 12/10/2013 20:18     [facility]   Culture     Final   Value:        BLOOD CULTURE RECEIVED NO GROWTH TO DATE CULTURE WILL BE HELD FOR 5 DAYS BEFORE ISSUING A FINAL NEGATIVE REPORT     [facility]   Report Status PENDING   Incomplete  CULTURE, BLOOD (ROUTINE X 2)     Status: None   Collection Time    12/10/13  1:20 PM  Result Value Range Status   Specimen Description BLOOD LEFT ARM   Final   Special Requests BOTTLES DRAWN AEROBIC AND ANAEROBIC 10CC   Final   Culture  Setup Time     Final   Value: 12/10/2013 20:11     Performed at [facility]   Culture     Final   Value:        BLOOD CULTURE RECEIVED NO GROWTH TO DATE CULTURE WILL BE HELD FOR 5 DAYS BEFORE ISSUING A FINAL NEGATIVE REPORT     Performed at [facility]   Report Status PENDING   Incomplete    Anti-infectives   Start     Dose/Rate Route Frequency Ordered Stop   12/11/13 1400  vancomycin (VANCOCIN) 50 mg/mL oral solution 125 mg     125 mg Oral 4 times per day 12/11/13 1336     12/11/13 1045  ciprofloxacin (CIPRO) IVPB 400 mg     400 mg 200 mL/hr over 60 Minutes Intravenous Every 12 hours 12/11/13 1036     12/10/13 1000  ciprofloxacin (CIPRO) IVPB 400 mg  Status:  Discontinued     400 mg 200 mL/hr over 60 Minutes Intravenous Every 12 hours 12/10/13 0729 12/11/13 1010   12/10/13 0652  metroNIDAZOLE (FLAGYL) IVPB 500 mg     500 mg 100 mL/hr over 60 Minutes Intravenous Every 8 hours  12/10/13 0652     12/09/13 2045  ciprofloxacin (CIPRO) IVPB 400 mg     400 mg 200 mL/hr over 60 Minutes Intravenous  Once 12/09/13 2031 12/09/13 2218   12/09/13 2045  metroNIDAZOLE (FLAGYL) IVPB 500 mg     500 mg 100 mL/hr over 60 Minutes Intravenous  Once 12/09/13 2031 12/10/13 0310      Assessment: 80 y.o. Female continues on Cipro (Day #4) for Klebsiella UTI. Pt also on po Vanc (Day #2) and Flagyl IV (Day #4) for cdiff. WBC 14.1- trending down. Afeb.   Goal of Therapy:  Eradication of infection  Plan:  1) Continue Ciprofloxacin 400mg  IV q12h 2) Consider stopping cipro (has had 4 days for K.pneumo UTI) to promote healing of CDiff colitis.   Christoper Fabian, PharmD, BCPS Clinical pharmacist, pager [phone] 12/13/2013,11:10 AM

## 2013-12-14 ENCOUNTER — Inpatient Hospital Stay (HOSPITAL_COMMUNITY): Payer: Medicare Other

## 2013-12-14 DIAGNOSIS — R609 Edema, unspecified: Secondary | ICD-10-CM

## 2013-12-14 LAB — HEPATIC FUNCTION PANEL
ALT: 9 U/L (ref 0–35)
AST: 19 U/L (ref 0–37)
Albumin: 2.1 g/dL — ABNORMAL LOW (ref 3.5–5.2)
Alkaline Phosphatase: 55 U/L (ref 39–117)
Bilirubin, Direct: <0.2 mg/dL (ref 0.0–0.3)
Total Bilirubin: 0.2 mg/dL — ABNORMAL LOW (ref 0.3–1.2)
Total Protein: 4.8 g/dL — ABNORMAL LOW (ref 6.0–8.3)

## 2013-12-14 LAB — BASIC METABOLIC PANEL
BUN: 4 mg/dL — ABNORMAL LOW (ref 6–23)
CO2: 23 mEq/L (ref 19–32)
Calcium: 7.9 mg/dL — ABNORMAL LOW (ref 8.4–10.5)
Chloride: 105 mEq/L (ref 96–112)
Creatinine, Ser: 0.58 mg/dL (ref 0.50–1.10)
GFR calc Af Amer: >90 mL/min (ref >90)
GFR calc non Af Amer: 84 mL/min — ABNORMAL LOW (ref >90)
Glucose, Bld: 112 mg/dL — ABNORMAL HIGH (ref 70–99)
Potassium: 3.5 mEq/L — ABNORMAL LOW (ref 3.7–5.3)
Sodium: 138 mEq/L (ref 137–147)

## 2013-12-14 LAB — CBC
HCT: 32.6 % — ABNORMAL LOW (ref 36.0–46.0)
Hemoglobin: 10.7 g/dL — ABNORMAL LOW (ref 12.0–15.0)
MCH: 31.6 pg (ref 26.0–34.0)
MCHC: 32.8 g/dL (ref 30.0–36.0)
MCV: 96.2 fL (ref 78.0–100.0)
Platelets: 285 10*3/uL (ref 150–400)
RBC: 3.39 MIL/uL — ABNORMAL LOW (ref 3.87–5.11)
RDW: 13.5 % (ref 11.5–15.5)
WBC: 14.4 10*3/uL — ABNORMAL HIGH (ref 4.0–10.5)

## 2013-12-14 LAB — STOOL CULTURE

## 2013-12-14 LAB — LACTATE DEHYDROGENASE: LDH: 171 U/L (ref 94–250)

## 2013-12-14 MED ORDER — IPRATROPIUM BROMIDE 0.02 % IN SOLN
RESPIRATORY_TRACT | Status: AC
  Administered 2013-12-14: 0.5 mg via RESPIRATORY_TRACT
  Filled 2013-12-14: qty 2.5

## 2013-12-14 MED ORDER — CIPROFLOXACIN HCL 500 MG PO TABS
500.0000 mg | ORAL_TABLET | Freq: Once | ORAL | Status: AC
  Administered 2013-12-14: 500 mg via ORAL
  Filled 2013-12-14: qty 1

## 2013-12-14 MED ORDER — LEVALBUTEROL HCL 0.63 MG/3ML IN NEBU
0.6300 mg | INHALATION_SOLUTION | RESPIRATORY_TRACT | Status: DC | PRN
  Filled 2013-12-14: qty 3

## 2013-12-14 MED ORDER — FUROSEMIDE 10 MG/ML IJ SOLN
40.0000 mg | Freq: Once | INTRAMUSCULAR | Status: DC
Start: 2013-12-14 — End: 2013-12-14
  Filled 2013-12-14: qty 4

## 2013-12-14 MED ORDER — IPRATROPIUM BROMIDE 0.02 % IN SOLN
0.5000 mg | RESPIRATORY_TRACT | Status: AC
  Administered 2013-12-14: 0.5 mg via RESPIRATORY_TRACT

## 2013-12-14 MED ORDER — KETOROLAC TROMETHAMINE 30 MG/ML IJ SOLN
30.0000 mg | Freq: Once | INTRAMUSCULAR | Status: AC
  Administered 2013-12-14: 30 mg via INTRAVENOUS
  Filled 2013-12-14: qty 1

## 2013-12-14 MED ORDER — FUROSEMIDE 10 MG/ML IJ SOLN
60.0000 mg | Freq: Once | INTRAMUSCULAR | Status: AC
  Administered 2013-12-14: 60 mg via INTRAVENOUS

## 2013-12-14 MED ORDER — TRAMADOL HCL 50 MG PO TABS
25.0000 mg | ORAL_TABLET | Freq: Four times a day (QID) | ORAL | Status: DC | PRN
  Administered 2013-12-15 – 2013-12-17 (×5): 50 mg via ORAL
  Filled 2013-12-14 (×5): qty 1

## 2013-12-14 MED ORDER — ALBUTEROL SULFATE (2.5 MG/3ML) 0.083% IN NEBU
2.5000 mg | INHALATION_SOLUTION | RESPIRATORY_TRACT | Status: AC
  Administered 2013-12-14: 2.5 mg via RESPIRATORY_TRACT

## 2013-12-14 MED ORDER — ALBUTEROL SULFATE (2.5 MG/3ML) 0.083% IN NEBU
INHALATION_SOLUTION | RESPIRATORY_TRACT | Status: AC
  Administered 2013-12-14: 2.5 mg via RESPIRATORY_TRACT
  Filled 2013-12-14: qty 3

## 2013-12-14 NOTE — Progress Notes (Addendum)
[facility] PROGRESS NOTE  Sydney Gross ZOX:[id number] DOB: Feb 04, 1930 DOA: 12/09/2013 PCP: Lupita Raider, MD  Assessment/Plan: 80 y.o. female with Past medical history of hypertension and dyslipidemia presents with complaints of abdominal pain that has been ongoing since last 10 days found to have c diff  1. C diff colitis; CT: Findings consistent with moderate to severe diffuse colitis; afebrile  -Continue IV Flagyl and oral vancomycin -Abdominal x-rays from 1/6 with nonspecific gas pattern no evidence of ileus or obstruction or findings to suggest perforation. -repeat CT scan of the abdomen with decreased colonic thickening particularly in transverse colon Though still thickening in the right colon -Continue clears, gradually improving>>follow 2 Kleb UTI- on cipro and given she already has C. difficile will DC Cipro after completing 5 days-1/9 and follow 3. Leukocytosis likely UTI+GI related; cont IV atx;  -Follow and recheck in a.m. 4. HTN holding home regimen; resume when stable  5. Neck and left upper extremity pain -MRI with Moderate multilevel degenerative disc  disease, worst at C5-6 where there is mild-to-moderate spinal canal  and neural foraminal stenosis. -Pain improving with when necessary Toradol, will start a trial of Ultram for pain management 6.? Ascites per CT -Ultrasound guided paracentesis per attempted but not enough fluid  7. Left greater than right lower extremity edema -Obtain Dopplers of lower extremities to evaluate for dvt 8. Episode of shortness of breath -Given peripheral edema and small amount of ascites>> this is likely secondary to volume overload -We'll decrease IV fluids given IV Lasix follow  Code Status: full Family Communication: d/w daughter at thebedside  Disposition Plan: home when improved    Consultants:  None   Procedures:  None   Antibiotics:  cipro 1/5>>1/9  Oral vanc 1/6>>  Flagyl  1/5>>   HPI/Subjective: Sitting up in chair, still with diarrhea x4 today so far. Denies nausea vomiting. States her neck better. Complaining of swelling in legs Objective: Filed Vitals:   12/14/13 1509  BP: 159/83  Pulse: 104  Temp:   Resp: 22    Intake/Output Summary (Last 24 hours) at 12/14/13 1857 Last data filed at 12/14/13 1519  Gross per 24 hour  Intake      0 ml  Output    200 ml  Net   -200 ml   Filed Weights   12/13/13 1159  Weight: 63.3 kg (139 lb 8.8 oz)    Exam:   General:  alert and oriented x3  HEENT: Tender over cervical spine, range of movement limited by pain  Cardiovascular: s1,s2 RRR  Respiratory: CTA BL  Abdomen: soft, nontender, distended bowel sounds present, no rebound   Extremities: +1 LE edema right greater than left  Data Reviewed: Basic Metabolic Panel:  Recent Labs Lab 12/09/13 1653 12/10/13 0727 12/11/13 0545 12/12/13 0530 12/14/13 0637  NA 135* 139 136* 137 138  K 4.2 3.9 3.9 3.7 3.5*  CL 96 104 101 104 105  CO2 26 25 24 20 23   GLUCOSE 111* 103* 94 121* 112*  BUN 15 10 6  4* 4*  CREATININE 0.72 0.66 0.62 0.59 0.58  CALCIUM 9.0 7.9* 8.3* 8.2* 7.9*   Liver Function Tests:  Recent Labs Lab 12/09/13 1653 12/10/13 0727 12/14/13 0637  AST 12 10 19   ALT 9 7 9   ALKPHOS 88 69 55  BILITOT 0.3 0.3 0.2*  PROT 6.9 5.3* 4.8*  ALBUMIN 3.3* 2.6* 2.1*   No results found for this basename: LIPASE, AMYLASE,  in the last 168 hours No results found  for this basename: AMMONIA,  in the last 168 hours CBC:  Recent Labs Lab 12/09/13 1653 12/10/13 0727 12/11/13 0545 12/12/13 0530 12/14/13 0637  WBC 17.7* 15.5* 17.0* 14.1* 14.4*  NEUTROABS 13.6*  --   --   --   --   HGB 14.5 12.1 12.1 12.2 10.7*  HCT 43.3 36.8 37.0 35.1* 32.6*  MCV 96.9 96.8 97.1 94.6 96.2  PLT 313 252 290 292 285   Cardiac Enzymes: No results found for this basename: CKTOTAL, CKMB, CKMBINDEX, TROPONINI,  in the last 168 hours BNP (last 3 results) No  results found for this basename: PROBNP,  in the last 8760 hours CBG: No results found for this basename: GLUCAP,  in the last 168 hours  Recent Results (from the past 240 hour(s))  URINE CULTURE     Status: None   Collection Time    12/09/13  7:02 PM      Result Value Range Status   Specimen Description URINE, CLEAN CATCH   Final   Special Requests NONE   Final   Culture  Setup Time     Final   Value: 12/09/2013 19:41     Performed at [facility] Count     Final   Value: >=100,000 COLONIES/ML     Performed at [facility]   Culture     Final   Value: KLEBSIELLA PNEUMONIAE     Performed at [facility]   Report Status 12/11/2013 FINAL   Final   Organism ID, Bacteria KLEBSIELLA PNEUMONIAE   Final  CLOSTRIDIUM DIFFICILE BY PCR     Status: Abnormal   Collection Time    12/09/13 11:58 PM      Result Value Range Status   C difficile by pcr POSITIVE (*) NEGATIVE Final   Comment: CRITICAL RESULT CALLED TO, READ BACK BY AND VERIFIED WITH:     BRANDON RN 11:55 12/10/13 ([email or web address])  STOOL CULTURE     Status: None   Collection Time    12/09/13 11:58 PM      Result Value Range Status   Specimen Description STOOL   Final   Special Requests NONE   Final   Culture     Final   Value: NO SALMONELLA, SHIGELLA, CAMPYLOBACTER, YERSINIA, OR E.COLI 0157:H7 ISOLATED     Note: REDUCED NORMAL FLORA PRESENT     Performed at [facility]   Report Status 12/14/2013 FINAL   Final  CULTURE, BLOOD (ROUTINE X 2)     Status: None   Collection Time    12/10/13  1:10 PM      Result Value Range Status   Specimen Description BLOOD RIGHT HAND   Final   Special Requests BOTTLES DRAWN AEROBIC ONLY 5CC   Final   Culture  Setup Time     Final   Value: 12/10/2013 20:18     Performed at [facility]   Culture     Final   Value:        BLOOD CULTURE RECEIVED NO GROWTH TO DATE CULTURE WILL BE HELD FOR 5 DAYS BEFORE ISSUING A FINAL NEGATIVE REPORT     Performed at  [facility]   Report Status PENDING   Incomplete  CULTURE, BLOOD (ROUTINE X 2)     Status: None   Collection Time    12/10/13  1:20 PM      Result Value Range Status   Specimen Description BLOOD LEFT ARM  Final   Special Requests BOTTLES DRAWN AEROBIC AND ANAEROBIC 10CC   Final   Culture  Setup Time     Final   Value: 12/10/2013 20:11     Performed at [facility]   Culture     Final   Value:        BLOOD CULTURE RECEIVED NO GROWTH TO DATE CULTURE WILL BE HELD FOR 5 DAYS BEFORE ISSUING A FINAL NEGATIVE REPORT     Performed at [facility]   Report Status PENDING   Incomplete     Studies: Mr Cervical Spine Wo Contrast  12/13/2013   CLINICAL DATA:  Neck and left upper extremity pain.  EXAM: MRI CERVICAL SPINE WITHOUT CONTRAST  TECHNIQUE: Multiplanar, multisequence MR imaging was performed. No intravenous contrast was administered.  COMPARISON:  Cervical spine radiographs 06/1912  FINDINGS: Images are moderately degraded by motion artifact, particularly axial images.  There is is grade 1 retrolisthesis of C4 on C5. There is grade 1 anterolisthesis of C6 on C7. Moderate multilevel disc space narrowing is present. Multilevel degenerative endplate marrow changes are present, most prominently superiorly at C4. Vertebral body heights are preserved. Craniocervical junction is unremarkable. The spinal cord is normal in caliber without definite signal abnormality identified. The visualized paraspinal soft tissues are unremarkable.  C2-3:  Negative.  C3-4: Mild broad-based posterior disc osteophyte complex and uncovertebral hypertrophy result in mild left-greater-than-right neural foraminal stenosis. The right vertebral artery demonstrates a tortuous course at this level where it extends well into the neural foramen. No spinal canal stenosis.  C4-5: Disc osteophyte complex and uncovertebral hypertrophy result mild right neural foraminal stenosis and mild spinal canal narrowing.  C5-6:  Disc osteophyte complex and uncovertebral hypertrophy result in mild to moderate bilateral neural foraminal stenosis and mild-to-moderate spinal canal stenosis.  C6-7: Mild disc osteophyte complex without spinal canal or neural foraminal stenosis.  C7-T1:  Negative.  IMPRESSION: Moderate motion artifact. Moderate multilevel degenerative disc disease, worst at C5-6 where there is mild-to-moderate spinal canal and neural foraminal stenosis.   Electronically Signed   By: Sebastian Ache   On: 12/13/2013 19:22   Ct Abdomen Pelvis W Contrast  12/13/2013   CLINICAL DATA:  Abdominal distension with diarrhea.  EXAM: CT ABDOMEN AND PELVIS WITH CONTRAST  TECHNIQUE: Multidetector CT imaging of the abdomen and pelvis was performed using the standard protocol following bolus administration of intravenous contrast.  CONTRAST:  OMNIPAQUE IOHEXOL 300 MG/ML  SOLN  COMPARISON:  12/09/2013  FINDINGS: There has been development of a small right pleural effusion and there is increased fluid around the liver. There is no evidence for free air. Stable appearance of the liver and the portal venous system is patent. The gallbladder has been removed. There is no gross abnormality to the pancreas, spleen and adrenal glands. Again noted is a low-density cyst along the right kidney lower pole measuring up to 2.7 cm. This cyst may have a septation based on the coronal reformats. Left kidney is slightly atrophic and there is a 0.7 cm low-density structure in the posterior mid pole with fat attenuation. This could represent a small angiomyolipoma. There is rightward scoliosis of the lumbar spine. There is increased fluid in the abdomen but the amount of fluid in the pelvis has slightly decreased.  Again noted is wall thickening in the transverse colon but this has decreased compared to the prior examination. There is still significant wall thickening in the right colon and hepatic flexure. There is oral contrast  throughout the colon. No  evidence for an obstruction. There is increased subcutaneous edema. Again noted is soft tissue in the right lower abdomen probably associated with the right adnexa and poorly characterized. Uterus has been removed. No evidence for small bowel dilatation. Stable area of sclerosis involving the right iliac bone on sequence 2, image 51 is nonspecific.  IMPRESSION: The degree of colonic wall thickening has decreased, particularly in the transverse colon. There is still significant wall thickening along the right colon. Findings remain compatible with colitis.  There is increased ascites and development of a small right pleural effusion and subcutaneous edema.  Right renal cyst. Question a sub cm angiomyolipoma in the left kidney.   Electronically Signed   By: Richarda OverlieAdam  Henn M.D.   On: 12/13/2013 18:22   [place] Abdomen Limited  12/14/2013   CLINICAL DATA:  Ascites search  EXAM: LIMITED ABDOMEN ULTRASOUND FOR ASCITES  TECHNIQUE: Limited ultrasound survey for ascites was performed in all four abdominal quadrants.  COMPARISON:  None.  FINDINGS: There is no significant amounts of abdominal or pelvic free fluid to perform a paracentesis.  IMPRESSION: No significant amount of abdominal or pelvic free fluid to perform a paracentesis.   Electronically Signed   By: Elige KoHetal  Patel   On: 12/14/2013 12:40   Dg Shoulder Left Port  12/13/2013   CLINICAL DATA:  Left shoulder pain  EXAM: PORTABLE LEFT SHOULDER - 2+ VIEW  COMPARISON:  None.  FINDINGS: There is no evidence of fracture or dislocation. Mild osteoarthritis involves the AC joint. Soft tissues are unremarkable.  IMPRESSION: 1. No acute findings. 2. Mild AC joint osteoarthritis.   Electronically Signed   By: Signa Kellaylor  Stroud M.D.   On: 12/13/2013 11:04    Scheduled Meds: . aspirin EC  81 mg Oral Daily  . ciprofloxacin  400 mg Intravenous Q12H  . enoxaparin (LOVENOX) injection  40 mg Subcutaneous Q24H  . feeding supplement (RESOURCE BREEZE)  1 Container Oral TID PC  .  saccharomyces boulardii  250 mg Oral BID  . vancomycin  125 mg Oral Q6H   Continuous Infusions: . dextrose 5 % and 0.9 % NaCl with KCl 20 mEq/L 10 mL/hr at 12/14/13 1305    Principal Problem:   Colitis Active Problems:   Hypertension   Dyslipidemia   Cough   Malnutrition of moderate degree    Time spent: 35 minutes     Journi Moffa C  [facility] Pager [phone]. If 7PM-7AM, please contact night-coverage at [email or web address], password [email or web address] 12/14/2013, 6:57 PM  LOS: 5 days

## 2013-12-14 NOTE — Progress Notes (Signed)
Patient was scheduled today for a diagnostic and therapeutic US guided paracentesis. Upon reviewing images there is minimal amount of ascites present which is not safely approachable to drain percutaneously, the procedure was cancelled and Dr. Suanne MarkerViyuoh was contacted.   Pattricia BossKoreen Hubert Raatz PA-C Interventional Radiology  12/14/13  12:05 PM

## 2013-12-15 DIAGNOSIS — E876 Hypokalemia: Secondary | ICD-10-CM

## 2013-12-15 DIAGNOSIS — R609 Edema, unspecified: Secondary | ICD-10-CM

## 2013-12-15 LAB — CBC
HCT: 32.2 % — ABNORMAL LOW (ref 36.0–46.0)
Hemoglobin: 10.7 g/dL — ABNORMAL LOW (ref 12.0–15.0)
MCH: 31.4 pg (ref 26.0–34.0)
MCHC: 33.2 g/dL (ref 30.0–36.0)
MCV: 94.4 fL (ref 78.0–100.0)
Platelets: 286 10*3/uL (ref 150–400)
RBC: 3.41 MIL/uL — ABNORMAL LOW (ref 3.87–5.11)
RDW: 13.4 % (ref 11.5–15.5)
WBC: 11.4 10*3/uL — ABNORMAL HIGH (ref 4.0–10.5)

## 2013-12-15 LAB — BASIC METABOLIC PANEL
BUN: 6 mg/dL (ref 6–23)
CO2: 24 mEq/L (ref 19–32)
Calcium: 8.3 mg/dL — ABNORMAL LOW (ref 8.4–10.5)
Chloride: 103 mEq/L (ref 96–112)
Creatinine, Ser: 0.58 mg/dL (ref 0.50–1.10)
GFR calc Af Amer: >90 mL/min (ref >90)
GFR calc non Af Amer: 84 mL/min — ABNORMAL LOW (ref >90)
Glucose, Bld: 106 mg/dL — ABNORMAL HIGH (ref 70–99)
Potassium: 3.2 mEq/L — ABNORMAL LOW (ref 3.7–5.3)
Sodium: 139 mEq/L (ref 137–147)

## 2013-12-15 MED ORDER — FUROSEMIDE 10 MG/ML IJ SOLN
40.0000 mg | Freq: Every day | INTRAMUSCULAR | Status: DC
  Administered 2013-12-15 – 2013-12-16 (×2): 40 mg via INTRAVENOUS
  Filled 2013-12-15 (×2): qty 4

## 2013-12-15 MED ORDER — POTASSIUM CHLORIDE CRYS ER 20 MEQ PO TBCR
40.0000 meq | EXTENDED_RELEASE_TABLET | ORAL | Status: AC
  Administered 2013-12-15 (×2): 40 meq via ORAL
  Filled 2013-12-15 (×2): qty 2

## 2013-12-15 MED ORDER — FUROSEMIDE 40 MG PO TABS: 40.0000 mg | ORAL_TABLET | Freq: Every day | ORAL | Status: DC

## 2013-12-15 NOTE — Progress Notes (Signed)
VASCULAR LAB PRELIMINARY  PRELIMINARY  PRELIMINARY  PRELIMINARY  Bilateral lower extremity venous Dopplers completed.    Preliminary report:  There is no DVT or SVT noted in the bilateral lower extremities.  Jaelah Hauth, RVT 12/15/2013, 4:03 PM

## 2013-12-15 NOTE — Progress Notes (Addendum)
[facility] PROGRESS NOTE  Sydney Gross ZOX:[id number] DOB: 07-05-1930 DOA: 12/09/2013 PCP: Lupita RaiderSHAW,KIMBERLEE, MD  Assessment/Plan: 80 y.o. female with Past medical history of hypertension and dyslipidemia presents with complaints of abdominal pain that has been ongoing since last 10 days found to have c diff  1. C diff colitis; CT: Findings consistent with moderate to severe diffuse colitis; afebrile  -Continue IV Flagyl and oral vancomycin -Abdominal x-rays from 1/6 with nonspecific gas pattern no evidence of ileus or obstruction or findings to suggest perforation. -repeat CT scan of the abdomen with decreased colonic thickening particularly in transverse colon Though still thickening in the right colon -improving clinically, will advance diet 2 Kleb UTI- on cipro and given she already has C. difficile will DC Cipro after completing 5 days-1/9 and follow 3. Leukocytosis likely UTI+GI related; cont IV atx;  -Follow and recheck in a.m. 4. HTN holding home regimen; resume when stable  5. Neck and left upper extremity pain -MRI with Moderate multilevel degenerative disc  disease, worst at C5-6 where there is mild-to-moderate spinal canal  and neural foraminal stenosis. -Pain improving with when necessary Toradol, will start a trial of Ultram for pain management 6.? Ascites per CT -Ultrasound guided paracentesis per attempted but not enough fluid  7. Left greater than right lower extremity edema -Dopplers of lower extremities pending>> call ed and they'll be done today>>follow 8. Episode of shortness of breath -Given peripheral edema and small amount of ascites>> this is likely secondary to volume overload -resolved. -d/c IV fluids continue gently diuresising with IV Lasix follow 9.Hypokalemia -replace k  Code Status: full Family Communication: d/w daughter at the bedside  Disposition Plan: home when medically ready   Consultants:  None   Procedures:  None    Antibiotics:  cipro 1/5>>1/9  Oral vanc 1/6>>  Flagyl 1/5>>   HPI/Subjective: Better today, no further diarrhea. Better po intake and less leg swelling. Objective: Filed Vitals:   12/15/13 1455  BP: 134/84  Pulse: 87  Temp:   Resp: 20    Intake/Output Summary (Last 24 hours) at 12/15/13 1503 Last data filed at 12/14/13 2100  Gross per 24 hour  Intake      0 ml  Output    200 ml  Net   -200 ml   Filed Weights   12/13/13 1159  Weight: 63.3 kg (139 lb 8.8 oz)    Exam:   General:  alert and oriented x3  HEENT: Tender over cervical spine, range of movement limited by pain  Cardiovascular: s1,s2 RRR  Respiratory: CTA BL  Abdomen: soft, nontender, distended bowel sounds present, no rebound   Extremities: decreased edema  Data Reviewed: Basic Metabolic Panel:  Recent Labs Lab 12/10/13 0727 12/11/13 0545 12/12/13 0530 12/14/13 0637 12/15/13 0430  NA 139 136* 137 138 139  K 3.9 3.9 3.7 3.5* 3.2*  CL 104 101 104 105 103  CO2 25 24 20 23 24   GLUCOSE 103* 94 121* 112* 106*  BUN 10 6 4* 4* 6  CREATININE 0.66 0.62 0.59 0.58 0.58  CALCIUM 7.9* 8.3* 8.2* 7.9* 8.3*   Liver Function Tests:  Recent Labs Lab 12/09/13 1653 12/10/13 0727 12/14/13 0637  AST 12 10 19   ALT 9 7 9   ALKPHOS 88 69 55  BILITOT 0.3 0.3 0.2*  PROT 6.9 5.3* 4.8*  ALBUMIN 3.3* 2.6* 2.1*   No results found for this basename: LIPASE, AMYLASE,  in the last 168 hours No results found for this basename: AMMONIA,  in the last 168 hours CBC:  Recent Labs Lab 12/09/13 1653 12/10/13 0727 12/11/13 0545 12/12/13 0530 12/14/13 0637 12/15/13 0430  WBC 17.7* 15.5* 17.0* 14.1* 14.4* 11.4*  NEUTROABS 13.6*  --   --   --   --   --   HGB 14.5 12.1 12.1 12.2 10.7* 10.7*  HCT 43.3 36.8 37.0 35.1* 32.6* 32.2*  MCV 96.9 96.8 97.1 94.6 96.2 94.4  PLT 313 252 290 292 285 286   Cardiac Enzymes: No results found for this basename: CKTOTAL, CKMB, CKMBINDEX, TROPONINI,  in the last 168  hours BNP (last 3 results) No results found for this basename: PROBNP,  in the last 8760 hours CBG: No results found for this basename: GLUCAP,  in the last 168 hours  Recent Results (from the past 240 hour(s))  URINE CULTURE     Status: None   Collection Time    12/09/13  7:02 PM      Result Value Range Status   Specimen Description URINE, CLEAN CATCH   Final   Special Requests NONE   Final   Culture  Setup Time     Final   Value: 12/09/2013 19:41     Performed at [facility] Count     Final   Value: >=100,000 COLONIES/ML     Performed at [facility]   Culture     Final   Value: KLEBSIELLA PNEUMONIAE     Performed at [facility]   Report Status 12/11/2013 FINAL   Final   Organism ID, Bacteria KLEBSIELLA PNEUMONIAE   Final  CLOSTRIDIUM DIFFICILE BY PCR     Status: Abnormal   Collection Time    12/09/13 11:58 PM      Result Value Range Status   C difficile by pcr POSITIVE (*) NEGATIVE Final   Comment: CRITICAL RESULT CALLED TO, READ BACK BY AND VERIFIED WITH:     BRANDON RN 11:55 12/10/13 ([email or web address])  STOOL CULTURE     Status: None   Collection Time    12/09/13 11:58 PM      Result Value Range Status   Specimen Description STOOL   Final   Special Requests NONE   Final   Culture     Final   Value: NO SALMONELLA, SHIGELLA, CAMPYLOBACTER, YERSINIA, OR E.COLI 0157:H7 ISOLATED     Note: REDUCED NORMAL FLORA PRESENT     Performed at [facility]   Report Status 12/14/2013 FINAL   Final  CULTURE, BLOOD (ROUTINE X 2)     Status: None   Collection Time    12/10/13  1:10 PM      Result Value Range Status   Specimen Description BLOOD RIGHT HAND   Final   Special Requests BOTTLES DRAWN AEROBIC ONLY 5CC   Final   Culture  Setup Time     Final   Value: 12/10/2013 20:18     Performed at [facility]   Culture     Final   Value:        BLOOD CULTURE RECEIVED NO GROWTH TO DATE CULTURE WILL BE HELD FOR 5 DAYS BEFORE ISSUING A FINAL  NEGATIVE REPORT     Performed at [facility]   Report Status PENDING   Incomplete  CULTURE, BLOOD (ROUTINE X 2)     Status: None   Collection Time    12/10/13  1:20 PM      Result Value Range Status   Specimen Description  BLOOD LEFT ARM   Final   Special Requests BOTTLES DRAWN AEROBIC AND ANAEROBIC 10CC   Final   Culture  Setup Time     Final   Value: 12/10/2013 20:11     Performed at [facility]   Culture     Final   Value:        BLOOD CULTURE RECEIVED NO GROWTH TO DATE CULTURE WILL BE HELD FOR 5 DAYS BEFORE ISSUING A FINAL NEGATIVE REPORT     Performed at [facility]   Report Status PENDING   Incomplete     Studies: Mr Cervical Spine Wo Contrast  12/13/2013   CLINICAL DATA:  Neck and left upper extremity pain.  EXAM: MRI CERVICAL SPINE WITHOUT CONTRAST  TECHNIQUE: Multiplanar, multisequence MR imaging was performed. No intravenous contrast was administered.  COMPARISON:  Cervical spine radiographs 06/1912  FINDINGS: Images are moderately degraded by motion artifact, particularly axial images.  There is is grade 1 retrolisthesis of C4 on C5. There is grade 1 anterolisthesis of C6 on C7. Moderate multilevel disc space narrowing is present. Multilevel degenerative endplate marrow changes are present, most prominently superiorly at C4. Vertebral body heights are preserved. Craniocervical junction is unremarkable. The spinal cord is normal in caliber without definite signal abnormality identified. The visualized paraspinal soft tissues are unremarkable.  C2-3:  Negative.  C3-4: Mild broad-based posterior disc osteophyte complex and uncovertebral hypertrophy result in mild left-greater-than-right neural foraminal stenosis. The right vertebral artery demonstrates a tortuous course at this level where it extends well into the neural foramen. No spinal canal stenosis.  C4-5: Disc osteophyte complex and uncovertebral hypertrophy result mild right neural foraminal stenosis and  mild spinal canal narrowing.  C5-6: Disc osteophyte complex and uncovertebral hypertrophy result in mild to moderate bilateral neural foraminal stenosis and mild-to-moderate spinal canal stenosis.  C6-7: Mild disc osteophyte complex without spinal canal or neural foraminal stenosis.  C7-T1:  Negative.  IMPRESSION: Moderate motion artifact. Moderate multilevel degenerative disc disease, worst at C5-6 where there is mild-to-moderate spinal canal and neural foraminal stenosis.   Electronically Signed   By: Sebastian Ache   On: 12/13/2013 19:22   Ct Abdomen Pelvis W Contrast  12/13/2013   CLINICAL DATA:  Abdominal distension with diarrhea.  EXAM: CT ABDOMEN AND PELVIS WITH CONTRAST  TECHNIQUE: Multidetector CT imaging of the abdomen and pelvis was performed using the standard protocol following bolus administration of intravenous contrast.  CONTRAST:  OMNIPAQUE IOHEXOL 300 MG/ML  SOLN  COMPARISON:  12/09/2013  FINDINGS: There has been development of a small right pleural effusion and there is increased fluid around the liver. There is no evidence for free air. Stable appearance of the liver and the portal venous system is patent. The gallbladder has been removed. There is no gross abnormality to the pancreas, spleen and adrenal glands. Again noted is a low-density cyst along the right kidney lower pole measuring up to 2.7 cm. This cyst may have a septation based on the coronal reformats. Left kidney is slightly atrophic and there is a 0.7 cm low-density structure in the posterior mid pole with fat attenuation. This could represent a small angiomyolipoma. There is rightward scoliosis of the lumbar spine. There is increased fluid in the abdomen but the amount of fluid in the pelvis has slightly decreased.  Again noted is wall thickening in the transverse colon but this has decreased compared to the prior examination. There is still significant wall thickening in the right colon and hepatic  flexure. There is oral  contrast throughout the colon. No evidence for an obstruction. There is increased subcutaneous edema. Again noted is soft tissue in the right lower abdomen probably associated with the right adnexa and poorly characterized. Uterus has been removed. No evidence for small bowel dilatation. Stable area of sclerosis involving the right iliac bone on sequence 2, image 51 is nonspecific.  IMPRESSION: The degree of colonic wall thickening has decreased, particularly in the transverse colon. There is still significant wall thickening along the right colon. Findings remain compatible with colitis.  There is increased ascites and development of a small right pleural effusion and subcutaneous edema.  Right renal cyst. Question a sub cm angiomyolipoma in the left kidney.   Electronically Signed   By: Richarda Overlie M.D.   On: 12/13/2013 18:22   US Abdomen Limited  12/14/2013   CLINICAL DATA:  Ascites search  EXAM: LIMITED ABDOMEN ULTRASOUND FOR ASCITES  TECHNIQUE: Limited ultrasound survey for ascites was performed in all four abdominal quadrants.  COMPARISON:  None.  FINDINGS: There is no significant amounts of abdominal or pelvic free fluid to perform a paracentesis.  IMPRESSION: No significant amount of abdominal or pelvic free fluid to perform a paracentesis.   Electronically Signed   By: Elige Ko   On: 12/14/2013 12:40    Scheduled Meds: . aspirin EC  81 mg Oral Daily  . enoxaparin (LOVENOX) injection  40 mg Subcutaneous Q24H  . feeding supplement (RESOURCE BREEZE)  1 Container Oral TID PC  . furosemide  40 mg Intravenous Daily  . potassium chloride  40 mEq Oral Q4H  . saccharomyces boulardii  250 mg Oral BID  . vancomycin  125 mg Oral Q6H   Continuous Infusions: . dextrose 5 % and 0.9 % NaCl with KCl 20 mEq/L 20 mL/hr at 12/15/13 1203    Principal Problem:   Colitis Active Problems:   Hypertension   Dyslipidemia   Cough   Malnutrition of moderate degree    Time spent: 35 minutes      Aritzel Krusemark C  [facility] Pager [phone]. If 7PM-7AM, please contact night-coverage at [email or web address], password [facility] 12/15/2013, 3:03 PM  LOS: 6 days

## 2013-12-16 DIAGNOSIS — A0472 Enterocolitis due to Clostridium difficile, not specified as recurrent: Principal | ICD-10-CM

## 2013-12-16 LAB — CULTURE, BLOOD (ROUTINE X 2)
Culture: NO GROWTH
Culture: NO GROWTH

## 2013-12-16 LAB — CBC
HCT: 31 % — ABNORMAL LOW (ref 36.0–46.0)
Hemoglobin: 10.5 g/dL — ABNORMAL LOW (ref 12.0–15.0)
MCH: 32.3 pg (ref 26.0–34.0)
MCHC: 33.9 g/dL (ref 30.0–36.0)
MCV: 95.4 fL (ref 78.0–100.0)
Platelets: 323 10*3/uL (ref 150–400)
RBC: 3.25 MIL/uL — ABNORMAL LOW (ref 3.87–5.11)
RDW: 13.5 % (ref 11.5–15.5)
WBC: 9.6 10*3/uL (ref 4.0–10.5)

## 2013-12-16 LAB — BASIC METABOLIC PANEL
BUN: 6 mg/dL (ref 6–23)
CO2: 29 mEq/L (ref 19–32)
Calcium: 8.7 mg/dL (ref 8.4–10.5)
Chloride: 100 mEq/L (ref 96–112)
Creatinine, Ser: 0.52 mg/dL (ref 0.50–1.10)
GFR calc Af Amer: >90 mL/min (ref >90)
GFR calc non Af Amer: 87 mL/min — ABNORMAL LOW (ref >90)
Glucose, Bld: 105 mg/dL — ABNORMAL HIGH (ref 70–99)
Potassium: 3.9 mEq/L (ref 3.7–5.3)
Sodium: 140 mEq/L (ref 137–147)

## 2013-12-16 MED ORDER — NAPROXEN 500 MG PO TABS
500.0000 mg | ORAL_TABLET | Freq: Two times a day (BID) | ORAL | Status: DC
  Administered 2013-12-16 – 2013-12-17 (×2): 500 mg via ORAL
  Filled 2013-12-16 (×4): qty 1

## 2013-12-16 MED ORDER — KETOROLAC TROMETHAMINE 30 MG/ML IJ SOLN
INTRAMUSCULAR | Status: AC
  Filled 2013-12-16: qty 1

## 2013-12-16 MED ORDER — METHOCARBAMOL 500 MG PO TABS
500.0000 mg | ORAL_TABLET | Freq: Three times a day (TID) | ORAL | Status: DC | PRN
  Administered 2013-12-16 – 2013-12-17 (×2): 500 mg via ORAL
  Filled 2013-12-16 (×2): qty 1

## 2013-12-16 MED ORDER — KETOROLAC TROMETHAMINE 30 MG/ML IJ SOLN
30.0000 mg | Freq: Once | INTRAMUSCULAR | Status: AC
  Administered 2013-12-16: 30 mg via INTRAVENOUS
  Filled 2013-12-16: qty 1

## 2013-12-16 NOTE — Progress Notes (Signed)
[facility] PROGRESS NOTE  Sydney Gross [id number] DOB: 29-Dec-1929 DOA: 12/09/2013 PCP: Sydney Gross,KIMBERLEE, MD  Assessment/Plan: 80 y.o. female with Past medical history of hypertension and dyslipidemia presents with complaints of abdominal pain that has been ongoing since last 10 days found to have c diff  1. C diff colitis; CT: Findings consistent with moderate to severe diffuse colitis; afebrile  -Continue oral vancomycin complete 14 days -Abdominal x-rays from 1/6 with nonspecific gas pattern no evidence of ileus or obstruction or findings to suggest perforation. -repeat CT scan of the abdomen with decreased colonic thickening particularly in transverse colon Though still thickening in the right colon -improving clinically, tolerating solids 2 Kleb UTI- on cipro and given she already has C. difficile will DC Cipro after completing 5 days-1/9 and follow 3. Leukocytosis likely UTI+GI related; cont IV atx;  -Resolved 4. HTN holding home regimen; resume on discharge 5. Neck and left upper extremity pain -MRI with Moderate multilevel degenerative disc  disease, worst at C5-6 where there is mild-to-moderate spinal canal  and neural foraminal stenosis. -Pain improving with when necessary Toradol, will start a trial of Ultram for pain management 6.? Ascites per CT -Ultrasound guided paracentesis attempted per IR but not enough fluid  7. Left greater than right lower extremity edema -Dopplers of lower extremities neg -Resolved with diuresis 8. Episode of shortness of breath -Given peripheral edema and small amount of ascites>> this is likely secondary to volume overload -resolved with when necessary lasix, will DC Lasix 9.Hypokalemia -Resolved  Code Status: full Family Communication: d/w daughter at the bedside  Disposition Plan: home when medically ready   Consultants:  None   Procedures:  None   Antibiotics:  cipro 1/5>>1/9  Oral vanc 1/6>>  Flagyl  1/5>>   HPI/Subjective: no further diarrhea. Complaining of some right shoulder pain. States no more leg swelling Objective: Filed Vitals:   12/16/13 1409  BP: 120/65  Pulse: 86  Temp: 98.2 F (36.8 C)  Resp: 17    Intake/Output Summary (Last 24 hours) at 12/16/13 1412 Last data filed at 12/16/13 0537  Gross per 24 hour  Intake      0 ml  Output    750 ml  Net   -750 ml   Filed Weights   12/13/13 1159  Weight: 63.3 kg (139 lb 8.8 oz)    Exam:   General:  alert and oriented x3  HEENT: Tender over cervical spine, range of movement limited by pain  Cardiovascular: s1,s2 RRR  Respiratory: CTA BL  Abdomen: soft, nontender, distended bowel sounds present, no rebound   Extremities: decreased edema  Data Reviewed: Basic Metabolic Panel:  Recent Labs Lab 12/11/13 0545 12/12/13 0530 12/14/13 0637 12/15/13 0430 12/16/13 0535  NA 136* 137 138 139 140  K 3.9 3.7 3.5* 3.2* 3.9  CL 101 104 105 103 100  CO2 24 20 23 24 29   GLUCOSE 94 121* 112* 106* 105*  BUN 6 4* 4* 6 6  CREATININE 0.62 0.59 0.58 0.58 0.52  CALCIUM 8.3* 8.2* 7.9* 8.3* 8.7   Liver Function Tests:  Recent Labs Lab 12/09/13 1653 12/10/13 0727 12/14/13 0637  AST 12 10 19   ALT 9 7 9   ALKPHOS 88 69 55  BILITOT 0.3 0.3 0.2*  PROT 6.9 5.3* 4.8*  ALBUMIN 3.3* 2.6* 2.1*   No results found for this basename: LIPASE, AMYLASE,  in the last 168 hours No results found for this basename: AMMONIA,  in the last 168 hours CBC:  Recent  Labs Lab 12/09/13 1653  12/11/13 0545 12/12/13 0530 12/14/13 0637 12/15/13 0430 12/16/13 0535  WBC 17.7*  < > 17.0* 14.1* 14.4* 11.4* 9.6  NEUTROABS 13.6*  --   --   --   --   --   --   HGB 14.5  < > 12.1 12.2 10.7* 10.7* 10.5*  HCT 43.3  < > 37.0 35.1* 32.6* 32.2* 31.0*  MCV 96.9  < > 97.1 94.6 96.2 94.4 95.4  PLT 313  < > 290 292 285 286 323  < > = values in this interval not displayed. Cardiac Enzymes: No results found for this basename: CKTOTAL, CKMB,  CKMBINDEX, TROPONINI,  in the last 168 hours BNP (last 3 results) No results found for this basename: PROBNP,  in the last 8760 hours CBG: No results found for this basename: GLUCAP,  in the last 168 hours  Recent Results (from the past 240 hour(s))  URINE CULTURE     Status: None   Collection Time    12/09/13  7:02 PM      Result Value Range Status   Specimen Description URINE, CLEAN CATCH   Final   Special Requests NONE   Final   Culture  Setup Time     Final   Value: 12/09/2013 19:41     Performed at [facility] Count     Final   Value: >=100,000 COLONIES/ML     Performed at [facility]   Culture     Final   Value: KLEBSIELLA PNEUMONIAE     Performed at [facility]   Report Status 12/11/2013 FINAL   Final   Organism ID, Bacteria KLEBSIELLA PNEUMONIAE   Final  CLOSTRIDIUM DIFFICILE BY PCR     Status: Abnormal   Collection Time    12/09/13 11:58 PM      Result Value Range Status   C difficile by pcr POSITIVE (*) NEGATIVE Final   Comment: CRITICAL RESULT CALLED TO, READ BACK BY AND VERIFIED WITH:     BRANDON RN 11:55 12/10/13 ([email or web address])  STOOL CULTURE     Status: None   Collection Time    12/09/13 11:58 PM      Result Value Range Status   Specimen Description STOOL   Final   Special Requests NONE   Final   Culture     Final   Value: NO SALMONELLA, SHIGELLA, CAMPYLOBACTER, YERSINIA, OR E.COLI 0157:H7 ISOLATED     Note: REDUCED NORMAL FLORA PRESENT     Performed at [facility]   Report Status 12/14/2013 FINAL   Final  CULTURE, BLOOD (ROUTINE X 2)     Status: None   Collection Time    12/10/13  1:10 PM      Result Value Range Status   Specimen Description BLOOD RIGHT HAND   Final   Special Requests BOTTLES DRAWN AEROBIC ONLY 5CC   Final   Culture  Setup Time     Final   Value: 12/10/2013 20:18     Performed at [facility]   Culture     Final   Value: NO GROWTH 5 DAYS     Performed at [facility]   Report  Status 12/16/2013 FINAL   Final  CULTURE, BLOOD (ROUTINE X 2)     Status: None   Collection Time    12/10/13  1:20 PM      Result Value Range Status   Specimen Description BLOOD LEFT ARM  Final   Special Requests BOTTLES DRAWN AEROBIC AND ANAEROBIC 10CC   Final   Culture  Setup Time     Final   Value: 12/10/2013 20:11     Performed at [facility]   Culture     Final   Value: NO GROWTH 5 DAYS     Performed at [facility]   Report Status 12/16/2013 FINAL   Final     Studies: No results found.  Scheduled Meds: . aspirin EC  81 mg Oral Daily  . enoxaparin (LOVENOX) injection  40 mg Subcutaneous Q24H  . feeding supplement (RESOURCE BREEZE)  1 Container Oral TID PC  . ketorolac  30 mg Intravenous Once  . naproxen  500 mg Oral BID WC  . saccharomyces boulardii  250 mg Oral BID  . vancomycin  125 mg Oral Q6H   Continuous Infusions:    Principal Problem:   Colitis Active Problems:   Hypertension   Dyslipidemia   Cough   Malnutrition of moderate degree    Time spent: 25 minutes     Jullisa Grigoryan C  [facility] Pager [phone]. If 7PM-7AM, please contact night-coverage at [email or web address], password [facility] 12/16/2013, 2:12 PM  LOS: 7 days

## 2013-12-17 LAB — BASIC METABOLIC PANEL
BUN: 12 mg/dL (ref 6–23)
CO2: 34 mEq/L — ABNORMAL HIGH (ref 19–32)
Calcium: 8.9 mg/dL (ref 8.4–10.5)
Chloride: 97 mEq/L (ref 96–112)
Creatinine, Ser: 0.57 mg/dL (ref 0.50–1.10)
GFR calc Af Amer: >90 mL/min (ref >90)
GFR calc non Af Amer: 85 mL/min — ABNORMAL LOW (ref >90)
Glucose, Bld: 123 mg/dL — ABNORMAL HIGH (ref 70–99)
Potassium: 4.2 mEq/L (ref 3.7–5.3)
Sodium: 139 mEq/L (ref 137–147)

## 2013-12-17 MED ORDER — METHOCARBAMOL 500 MG PO TABS: 500.0000 mg | ORAL_TABLET | Freq: Three times a day (TID) | ORAL | Status: DC | PRN

## 2013-12-17 MED ORDER — SACCHAROMYCES BOULARDII 250 MG PO CAPS: 250.0000 mg | ORAL_CAPSULE | Freq: Two times a day (BID) | ORAL | Status: DC

## 2013-12-17 MED ORDER — TRAMADOL HCL 50 MG PO TABS: 25.0000 mg | ORAL_TABLET | Freq: Four times a day (QID) | ORAL | Status: DC | PRN

## 2013-12-17 MED ORDER — KETOROLAC TROMETHAMINE 30 MG/ML IJ SOLN
30.0000 mg | Freq: Once | INTRAMUSCULAR | Status: AC
  Administered 2013-12-17: 30 mg via INTRAVENOUS
  Filled 2013-12-17: qty 1

## 2013-12-17 MED ORDER — VANCOMYCIN 50 MG/ML ORAL SOLUTION: 125.0000 mg | Freq: Four times a day (QID) | ORAL | Status: DC

## 2013-12-17 NOTE — Discharge Summary (Signed)
Physician Discharge Summary  CAIT LOCUST JXB:[id number] DOB: Dec 04, 1930 DOA: 12/09/2013  PCP: Lupita Raider, MD  Admit date: 12/09/2013 Discharge date: 12/17/2013  Time spent: >30 minutes  Recommendations for Outpatient Follow-up:  Follow-up Information   Follow up with Lupita Raider, MD. (in 1-2wks, call for appt upon discharge)    Specialty:  Family Medicine   Contact information:   [address] [place] [address] [phone]        Discharge Diagnoses:  Principal Problem:   Colitis Active Problems:   Hypertension   Dyslipidemia   Cough   Malnutrition of moderate degree   Discharge Condition: Improved/stable  Diet recommendation: Low sodium heart healthy  Filed Weights   12/13/13 1159  Weight: 63.3 kg (139 lb 8.8 oz)    History of present illness:  GARA KINCADE is a 80 y.o. female with Past medical history of hypertension and dyslipidemia.  The patient is coming from home. The patient presents with complaints of abdominal pain that has been ongoing since last 10 days. She mentions that it started with complain of abdominal pain which was crampy in nature and later on started having episodes of diarrhea with 4-5 bowel movements initially and later on slowed down to 2-3 loose wall motions without any blood. She mentions that her abdominal pain with build up slowly and get better with a bowel movement. She has tried Pepto-Bismol but has not had.  She denies any recent travel recent hospitalization recent antibiotic use or any other change in her medication.  She mentions that she started having episode of flu around Christmas was resolving 3 days at that time she had sore throat fever chills cough.  On the day prior to admission she had vomiting which was bilious without any blood and on presentation she had 4-5 episode of vomiting and dry heaving.Therefore she came to the ER. She was admitted for further evaluation and  management.   Hospital Course:  1. C diff colitis;  As discussed above upon admission patient had a CT of the abdomen and pelvis which showed Findings consistent with moderate to severe diffuse colitis -Stool studies were obtained- and she was started on empiric abx. The C. difficile came back positive and she was maintained on Flagyl -florastor was also added -On followup on 1/6 patient had developed some abdominal distention and abdomen x-ray on 1/6 showed nonspecific gas pattern no evidence of ileus or obstruction or findings to suggest perforation -Oral vancomycin was then added to her treatment regimen - on follow up on 1/8 distension was persisting even though she continued to have diarrhea >> a repeat CT scan of the abdomen was done and showed decreased colonic thickening particularly in transverse colon Though still thickening in the right colon,also showed some ascites. She was sent for ultrasound guided paracentesis but not enough fluid to perform a paracentesis. -pt improved clinically and was able to tolerate by mouth's better and so was maintained on the oral Vanc and the flagyl was dc'ed -As she continued to improvedher diet was advanced to solids and the diarrhea has slowed down to about 1BM/day. -This is her first episode of C. difficile , she is to Continue oral vancomycin complete 14 days  -She has remained afebrile hemodynamically stable and her leukocytosis has resolved. 2 Kleb UTI- urine cultures grew Klebsiella and she was treated with a five-day course of antibiotics. 3. Leukocytosis likely UTI+GI related; cont IV atx;  -Resolved  4. HTN holding home regimen; -to continue her  outpatient medications on discharge 5. Neck and left upper extremity pain  -MRI with Moderate multilevel degenerative disc  disease, worst at C5-6 where there is mild-to-moderate spinal canal  and neural foraminal stenosis.  -Pain improving with when necessary Toradol, started on a trial of Ultram  for pain management which provided her some relief. Her Naprosyn which she was on outpatient has been resumed and she is to continue her outpt med son d/c an follow up with her PCP. 6.? Ascites per CT  -Ultrasound guided paracentesis attempted per IR but not enough fluid  7. Left greater than right lower extremity edema  -Dopplers of lower extremities neg. Following neg doppler she was diurseed with lasix as needed.  -Resolved with diuresis  8. Episode of shortness of breath  -Given peripheral edema and small amount of ascites>> this is likely secondary to volume overload d/t IVF she was receiving since admission (since she was having diarrhea). -resolved with when necessary lasix,  9.Hypokalemia  -d/t GI losses, her potassium was replaced in the hospital the      Procedures:  lower extremity Doppler ultrasound-negative for DVT -Paracentesis was attempted on 1/8 with no significant amount of fluid present to perform a  Consultations:  none  Discharge Exam: Filed Vitals:   12/17/13 0503  BP: 136/67  Pulse: 73  Temp: 98 F (36.7 C)  Resp: 20   Exam:  General: alert and oriented x3  HEENT: Tender over cervical spine, range of movement limited by pain  Cardiovascular: s1,s2 RRR  Respiratory: CTA BL  Abdomen: soft, nontender, distended bowel sounds present, no rebound  Extremities: decreased edema    Discharge Instructions  Discharge Orders   Future Orders Complete By Expires   Diet - low sodium heart healthy  As directed    Increase activity slowly  As directed        Medication List         aspirin EC 81 MG tablet  Take 81 mg by mouth daily.     colesevelam 625 MG tablet  Commonly known as:  WELCHOL  Take 1,875 mg by mouth 2 (two) times daily with a meal.     ezetimibe 10 MG tablet  Commonly known as:  ZETIA  Take 10 mg by mouth at bedtime.     lisinopril-hydrochlorothiazide 20-25 MG per tablet  Commonly known as:  PRINZIDE,ZESTORETIC  Take 1 tablet by  mouth daily.     methocarbamol 500 MG tablet  Commonly known as:  ROBAXIN  Take 1 tablet (500 mg total) by mouth 3 (three) times daily as needed for muscle spasms.     rosuvastatin 5 MG tablet  Commonly known as:  CRESTOR  Take 5 mg by mouth at bedtime.     saccharomyces boulardii 250 MG capsule  Commonly known as:  FLORASTOR  Take 1 capsule (250 mg total) by mouth 2 (two) times daily.     traMADol 50 MG tablet  Commonly known as:  ULTRAM  Take 0.5-1 tablets (25-50 mg total) by mouth every 6 (six) hours as needed for moderate pain.     vancomycin 50 mg/mL oral solution  Commonly known as:  VANCOCIN  Take 2.5 mLs (125 mg total) by mouth every 6 (six) hours.       Allergies  Allergen Reactions  . Codeine     GI upset      The results of significant diagnostics from this hospitalization (including imaging, microbiology, ancillary and laboratory) are listed below for reference.  Significant Diagnostic Studies: Dg Abd 1 View  12/11/2013   CLINICAL DATA:  Ten day history of abdominal distention with diarrhea and nausea and vomiting  EXAM: ABDOMEN - 1 VIEW  COMPARISON:  CT scan of the abdomen and pelvis dated December 09, 2013  FINDINGS: The bowel gas pattern is nonspecific. There small amounts of small bowel gas throughout the abdomen and there is a small amount of gas and stool within the colon. There is gas in the rectum. No free extraluminal gas collections are demonstrated. There is curvature of the lumbar spine convex toward the right. There are surgical clips in the gallbladder fossa.  IMPRESSION: The bowel gas pattern is nonspecific. There is no evidence of ileus nor of obstruction nor findings to suggest perforation.   Electronically Signed   By: David  SwazilandJordan   On: 12/11/2013 18:27   Mr Cervical Spine Wo Contrast  12/13/2013   CLINICAL DATA:  Neck and left upper extremity pain.  EXAM: MRI CERVICAL SPINE WITHOUT CONTRAST  TECHNIQUE: Multiplanar, multisequence MR imaging was  performed. No intravenous contrast was administered.  COMPARISON:  Cervical spine radiographs 06/1912  FINDINGS: Images are moderately degraded by motion artifact, particularly axial images.  There is is grade 1 retrolisthesis of C4 on C5. There is grade 1 anterolisthesis of C6 on C7. Moderate multilevel disc space narrowing is present. Multilevel degenerative endplate marrow changes are present, most prominently superiorly at C4. Vertebral body heights are preserved. Craniocervical junction is unremarkable. The spinal cord is normal in caliber without definite signal abnormality identified. The visualized paraspinal soft tissues are unremarkable.  C2-3:  Negative.  C3-4: Mild broad-based posterior disc osteophyte complex and uncovertebral hypertrophy result in mild left-greater-than-right neural foraminal stenosis. The right vertebral artery demonstrates a tortuous course at this level where it extends well into the neural foramen. No spinal canal stenosis.  C4-5: Disc osteophyte complex and uncovertebral hypertrophy result mild right neural foraminal stenosis and mild spinal canal narrowing.  C5-6: Disc osteophyte complex and uncovertebral hypertrophy result in mild to moderate bilateral neural foraminal stenosis and mild-to-moderate spinal canal stenosis.  C6-7: Mild disc osteophyte complex without spinal canal or neural foraminal stenosis.  C7-T1:  Negative.  IMPRESSION: Moderate motion artifact. Moderate multilevel degenerative disc disease, worst at C5-6 where there is mild-to-moderate spinal canal and neural foraminal stenosis.   Electronically Signed   By: Sebastian AcheAllen  Grady   On: 12/13/2013 19:22   Ct Abdomen Pelvis W Contrast  12/13/2013   CLINICAL DATA:  Abdominal distension with diarrhea.  EXAM: CT ABDOMEN AND PELVIS WITH CONTRAST  TECHNIQUE: Multidetector CT imaging of the abdomen and pelvis was performed using the standard protocol following bolus administration of intravenous contrast.  CONTRAST:  100mL  OMNIPAQUE IOHEXOL 300 MG/ML  SOLN  COMPARISON:  12/09/2013  FINDINGS: There has been development of a small right pleural effusion and there is increased fluid around the liver. There is no evidence for free air. Stable appearance of the liver and the portal venous system is patent. The gallbladder has been removed. There is no gross abnormality to the pancreas, spleen and adrenal glands. Again noted is a low-density cyst along the right kidney lower pole measuring up to 2.7 cm. This cyst may have a septation based on the coronal reformats. Left kidney is slightly atrophic and there is a 0.7 cm low-density structure in the posterior mid pole with fat attenuation. This could represent a small angiomyolipoma. There is rightward scoliosis of the lumbar spine. There is increased fluid  in the abdomen but the amount of fluid in the pelvis has slightly decreased.  Again noted is wall thickening in the transverse colon but this has decreased compared to the prior examination. There is still significant wall thickening in the right colon and hepatic flexure. There is oral contrast throughout the colon. No evidence for an obstruction. There is increased subcutaneous edema. Again noted is soft tissue in the right lower abdomen probably associated with the right adnexa and poorly characterized. Uterus has been removed. No evidence for small bowel dilatation. Stable area of sclerosis involving the right iliac bone on sequence 2, image 51 is nonspecific.  IMPRESSION: The degree of colonic wall thickening has decreased, particularly in the transverse colon. There is still significant wall thickening along the right colon. Findings remain compatible with colitis.  There is increased ascites and development of a small right pleural effusion and subcutaneous edema.  Right renal cyst. Question a sub cm angiomyolipoma in the left kidney.   Electronically Signed   By: Richarda Overlie M.D.   On: 12/13/2013 18:22   Ct Abdomen Pelvis W  Contrast  12/09/2013   CLINICAL DATA:  Abdominal pain, nausea and vomiting for 8 days with diarrhea  EXAM: CT ABDOMEN AND PELVIS WITH CONTRAST  TECHNIQUE: Multidetector CT imaging of the abdomen and pelvis was performed using the standard protocol following bolus administration of intravenous contrast.  CONTRAST:  OMNIPAQUE IOHEXOL 300 MG/ML  SOLN  COMPARISON:  None.  FINDINGS: Visualized lung bases clear.  Bone windows negative.  Liver normal. Gallbladder is surgically absent. Spleen, pancreas, and adrenal glands normal. 2 cm cyst lower pole right kidney. Mild to moderate left renal atrophy.  Calcification of the aorta with no dilatation. Bladder normal. Reproductive organs absent. Diffuse colon wall thickening. Moderate to severe wall thickening of the cecum and ascending colon. Moderate wall thickening of the transverse and proximal descending colon. Mild wall thickening of distal descending and sigmoid colon. Diverticulosis of the sigmoid colon seen incidentally. Small volume free fluid in the pelvis.  There is no evidence of pneumatosis. There is no free air or abscess.  IMPRESSION: Findings consistent with moderate to severe diffuse colitis. Although there is sigmoid colon diverticulosis, this is an incidental finding, and is not account for the diffuse colitis involving the entire large bowel.   Electronically Signed   By: Esperanza Heir M.D.   On: 12/09/2013 19:52   US Abdomen Limited  12/14/2013   CLINICAL DATA:  Ascites search  EXAM: LIMITED ABDOMEN ULTRASOUND FOR ASCITES  TECHNIQUE: Limited ultrasound survey for ascites was performed in all four abdominal quadrants.  COMPARISON:  None.  FINDINGS: There is no significant amounts of abdominal or pelvic free fluid to perform a paracentesis.  IMPRESSION: No significant amount of abdominal or pelvic free fluid to perform a paracentesis.   Electronically Signed   By: Elige Ko   On: 12/14/2013 12:40   Dg Chest Port 1 View  12/10/2013   CLINICAL  DATA:  Cough and congestion.  EXAM: PORTABLE CHEST - 1 VIEW  COMPARISON:  09/13/2012.  FINDINGS: Scoliosis thoracic spine convex the left.  Heart size within normal limits.  Central pulmonary vascular prominence without pulmonary edema.  Scarring right lung base.  Apical pleural thickening without associated bony destruction is stable.  No segmental consolidation or pneumothorax.  Calcified mildly tortuous aorta.  IMPRESSION: Central pulmonary vascular prominence without pulmonary edema.  Scarring right lung base without segmental infiltrate.   Electronically Signed   By:  [name] M.D.   On: 12/10/2013 12:16   Dg Shoulder Left Port  12/13/2013   CLINICAL DATA:  Left shoulder pain  EXAM: PORTABLE LEFT SHOULDER - 2+ VIEW  COMPARISON:  None.  FINDINGS: There is no evidence of fracture or dislocation. Mild osteoarthritis involves the AC joint. Soft tissues are unremarkable.  IMPRESSION: 1. No acute findings. 2. Mild AC joint osteoarthritis.   Electronically Signed   By: Signa Kell M.D.   On: 12/13/2013 11:04    Microbiology: Recent Results (from the past 240 hour(s))  URINE CULTURE     Status: None   Collection Time    12/09/13  7:02 PM      Result Value Range Status   Specimen Description URINE, CLEAN CATCH   Final   Special Requests NONE   Final   Culture  Setup Time     Final   Value: 12/09/2013 19:41     Performed at [facility] Count     Final   Value: >=100,000 COLONIES/ML     Performed at [facility]   Culture     Final   Value: KLEBSIELLA PNEUMONIAE     Performed at [facility]   Report Status 12/11/2013 FINAL   Final   Organism ID, Bacteria KLEBSIELLA PNEUMONIAE   Final  CLOSTRIDIUM DIFFICILE BY PCR     Status: Abnormal   Collection Time    12/09/13 11:58 PM      Result Value Range Status   C difficile by pcr POSITIVE (*) NEGATIVE Final   Comment: CRITICAL RESULT CALLED TO, READ BACK BY AND VERIFIED WITH:     BRANDON RN 11:55 12/10/13  ([email or web address])  STOOL CULTURE     Status: None   Collection Time    12/09/13 11:58 PM      Result Value Range Status   Specimen Description STOOL   Final   Special Requests NONE   Final   Culture     Final   Value: NO SALMONELLA, SHIGELLA, CAMPYLOBACTER, YERSINIA, OR E.COLI 0157:H7 ISOLATED     Note: REDUCED NORMAL FLORA PRESENT     Performed at [facility]   Report Status 12/14/2013 FINAL   Final  CULTURE, BLOOD (ROUTINE X 2)     Status: None   Collection Time    12/10/13  1:10 PM      Result Value Range Status   Specimen Description BLOOD RIGHT HAND   Final   Special Requests BOTTLES DRAWN AEROBIC ONLY 5CC   Final   Culture  Setup Time     Final   Value: 12/10/2013 20:18     Performed at [facility]   Culture     Final   Value: NO GROWTH 5 DAYS     Performed at [facility]   Report Status 12/16/2013 FINAL   Final  CULTURE, BLOOD (ROUTINE X 2)     Status: None   Collection Time    12/10/13  1:20 PM      Result Value Range Status   Specimen Description BLOOD LEFT ARM   Final   Special Requests BOTTLES DRAWN AEROBIC AND ANAEROBIC 10CC   Final   Culture  Setup Time     Final   Value: 12/10/2013 20:11     Performed at [facility]   Culture     Final   Value: NO GROWTH 5 DAYS     Performed at [facility]   Report Status 12/16/2013 FINAL   Final     Labs: Basic Metabolic Panel:  Recent Labs Lab 12/12/13 0530 12/14/13 0637 12/15/13 0430 12/16/13 0535 12/17/13 0709  NA 137 138 139 140 139  K 3.7 3.5* 3.2* 3.9 4.2  CL 104 105 103 100 97  CO2 20 23 24 29  34*  GLUCOSE 121* 112* 106* 105* 123*  BUN 4* 4* 6 6 12   CREATININE 0.59 0.58 0.58 0.52 0.57  CALCIUM 8.2* 7.9* 8.3* 8.7 8.9   Liver Function Tests:  Recent Labs Lab 12/14/13 0637  AST 19  ALT 9  ALKPHOS 55  BILITOT 0.2*  PROT 4.8*  ALBUMIN 2.1*   No results found for this basename: LIPASE, AMYLASE,  in the last 168 hours No results found for this basename:  AMMONIA,  in the last 168 hours CBC:  Recent Labs Lab 12/11/13 0545 12/12/13 0530 12/14/13 0637 12/15/13 0430 12/16/13 0535  WBC 17.0* 14.1* 14.4* 11.4* 9.6  HGB 12.1 12.2 10.7* 10.7* 10.5*  HCT 37.0 35.1* 32.6* 32.2* 31.0*  MCV 97.1 94.6 96.2 94.4 95.4  PLT 290 292 285 286 323   Cardiac Enzymes: No results found for this basename: CKTOTAL, CKMB, CKMBINDEX, TROPONINI,  in the last 168 hours BNP: BNP (last 3 results) No results found for this basename: PROBNP,  in the last 8760 hours CBG: No results found for this basename: GLUCAP,  in the last 168 hours     Signed:  [name]  [facility] 12/17/2013, 3:31 PM

## 2013-12-17 NOTE — Progress Notes (Signed)
Discharge teaching was done with patient. She was given prescriptions for tramadol, vancomycin, florastor, and robaxin to take to her pharmacy. Patient stated that she did not have any questions. Staff assisted patient and her husband to their transportation.

## 2013-12-25 ENCOUNTER — Encounter: Payer: Self-pay | Admitting: Infectious Diseases

## 2013-12-25 ENCOUNTER — Ambulatory Visit (INDEPENDENT_AMBULATORY_CARE_PROVIDER_SITE_OTHER): Payer: Medicare Other | Admitting: Infectious Diseases

## 2013-12-25 VITALS — BP 184/98 | HR 85 | Temp 97.6°F | Ht 60.0 in | Wt 124.0 lb

## 2013-12-25 DIAGNOSIS — A0472 Enterocolitis due to Clostridium difficile, not specified as recurrent: Secondary | ICD-10-CM

## 2013-12-25 DIAGNOSIS — I10 Essential (primary) hypertension: Secondary | ICD-10-CM

## 2013-12-25 NOTE — Assessment & Plan Note (Signed)
She will call her PCP and resume her anti-htn rx. I asked her to refrain from exercise til this is addressed.

## 2013-12-25 NOTE — Progress Notes (Signed)
   Subjective:    Patient ID: Sydney Gross, female    DOB: 04/28/1930, 80 y.o.   MRN: [id number]  HPI 80 yo F with hx of HTN, increased lipids, comes to hospital 1-4 to 12-17-13 with first episode of C diff. She had sx 1 week prior, she thought she had tht flu. She was initially started on flagyl however vancomycin PO added after she had abd distension. and she was ultimately d/c home on this for 2 weeks. She had fewer BM in hospital. She was also started on florastor. Had 5 BM yesterday, only 1 so far today. No f/c. No nausea, has been eating well. No dizziness, lightheadedness. Feels weak. 5# wt loss this week.  She denies previous anbx, or hospital visits.  Has been off anti-htn and lipid rx.   Review of Systems  Constitutional: Positive for unexpected weight change. Negative for fever, chills and appetite change.  Respiratory: Positive for shortness of breath.   Cardiovascular: Positive for leg swelling. Negative for chest pain.  Gastrointestinal: Positive for diarrhea. Negative for nausea, vomiting, abdominal pain, constipation and abdominal distention.  Genitourinary: Negative for difficulty urinating.  Neurological: Negative for headaches.       Objective:   Physical Exam  Constitutional: She appears well-developed and well-nourished.  HENT:  Mouth/Throat: No oropharyngeal exudate.  Eyes: EOM are normal. Pupils are equal, round, and reactive to light.  Neck: Neck supple.  Cardiovascular: Normal rate and regular rhythm.   Murmur heard. Pulmonary/Chest: Effort normal and breath sounds normal.  Abdominal: Soft. Bowel sounds are normal. She exhibits distension. There is no tenderness. There is no rebound.  Musculoskeletal: She exhibits edema.  2-3+ edema at lower calf.   Lymphadenopathy:    She has no cervical adenopathy.          Assessment & Plan:

## 2013-12-25 NOTE — Assessment & Plan Note (Addendum)
She appears to be improving. She will complete her current rx for c diff. I asked her to call if she has any evidence of worsening- n/v, fever, increasing loose BM, abd distention. If she does have worsening of sx, will start her on 6 week taper of po vanco (via [facility]). She is not a fecal transplant candidate at this point, would wait til she is at least on her 3rd recurrence. I suggested that she can stop the florastor, that the medical literature for this is very soft at best. She does not need to be isolated but suggested instead that everyone around her practices good handwashing with soap and water.  Will see her back prn.

## 2014-07-26 ENCOUNTER — Other Ambulatory Visit: Payer: Self-pay | Admitting: Family Medicine

## 2014-07-26 DIAGNOSIS — I739 Peripheral vascular disease, unspecified: Secondary | ICD-10-CM

## 2014-07-30 ENCOUNTER — Ambulatory Visit
Admission: RE | Admit: 2014-07-30 | Discharge: 2014-07-30 | Disposition: A | Discharge status: Home / Self Care | Payer: Medicare Other | Source: Ambulatory Visit | Attending: Family Medicine | Admitting: Family Medicine

## 2014-07-30 DIAGNOSIS — I739 Peripheral vascular disease, unspecified: Secondary | ICD-10-CM

## 2014-08-13 ENCOUNTER — Other Ambulatory Visit: Payer: Self-pay | Admitting: *Deleted

## 2014-08-13 ENCOUNTER — Encounter: Payer: Self-pay | Admitting: Vascular Surgery

## 2014-08-13 DIAGNOSIS — I70219 Atherosclerosis of native arteries of extremities with intermittent claudication, unspecified extremity: Secondary | ICD-10-CM

## 2014-08-13 DIAGNOSIS — I739 Peripheral vascular disease, unspecified: Secondary | ICD-10-CM

## 2014-08-29 ENCOUNTER — Encounter: Payer: Self-pay | Admitting: Vascular Surgery

## 2014-08-30 ENCOUNTER — Ambulatory Visit (INDEPENDENT_AMBULATORY_CARE_PROVIDER_SITE_OTHER): Payer: Medicare Other | Admitting: Vascular Surgery

## 2014-08-30 ENCOUNTER — Ambulatory Visit (HOSPITAL_COMMUNITY)
Admission: RE | Admit: 2014-08-30 | Discharge: 2014-08-30 | Disposition: A | Discharge status: Home / Self Care | Payer: Medicare Other | Source: Ambulatory Visit | Attending: Vascular Surgery | Admitting: Vascular Surgery

## 2014-08-30 ENCOUNTER — Encounter: Payer: Self-pay | Admitting: Vascular Surgery

## 2014-08-30 VITALS — BP 155/89 | HR 82 | Resp 16 | Ht 59.0 in | Wt 126.0 lb

## 2014-08-30 DIAGNOSIS — I739 Peripheral vascular disease, unspecified: Secondary | ICD-10-CM

## 2014-08-30 DIAGNOSIS — I70219 Atherosclerosis of native arteries of extremities with intermittent claudication, unspecified extremity: Secondary | ICD-10-CM | POA: Diagnosis not present

## 2014-08-30 DIAGNOSIS — M79604 Pain in right leg: Secondary | ICD-10-CM | POA: Insufficient documentation

## 2014-08-30 DIAGNOSIS — M79605 Pain in left leg: Secondary | ICD-10-CM | POA: Insufficient documentation

## 2014-08-30 NOTE — Addendum Note (Signed)
Addended by: Sharee Pimple on: 08/30/2014 05:14 PM   Modules accepted: Orders

## 2014-08-30 NOTE — Progress Notes (Signed)
Referred by:  Lupita Raider, MD [address] [place], [place] [address]  Reason for referral: bilateral leg pain  History of Present Illness  Sydney Gross is a 80 y.o. ([birth year]) female who presents with chief complaint: bilateral leg pain.  Onset of symptom occurred one year ago, consistent with intermittent claudication.  Pain is described as cramping in feet and calf, severity 3-6/10, and associated with exercise.  She has also had increased SOB with exertion recently.  She has not been able to complete her exercise class over the last month.  She also has intermittent waking up at night with cramping in feet.  Patient has attempted to treat this pain with rest.  The patient has no rest pain symptoms also and no leg wounds/ulcers.  Atherosclerotic risk factors include: HTN, HLD, prior smoking history.  Past Medical History  Diagnosis Date  . Hyperlipidemia   . Hypertension   . Colitis 12/2013    "never had this before"  . Family history of anesthesia complication     "mother; think they gave her too much; c/o pain after OR; gave her more RX; they had to bring her back real quick"  . COPD (chronic obstructive pulmonary disease)   . Exertional shortness of breath     "off and on" (12/10/2013)  . Arthritis     "maybe a little in my knees and back" (12/10/2013)    Past Surgical History  Procedure Laterality Date  . Tonsillectomy and adenoidectomy  ?1940's  . Cholecystectomy  ~ 2009  . Back surgery    . Lumbar disc surgery  ?2004  . Abdominal hysterectomy  1960's  . Cesarean section  6045; 1960    History   Social History  . Marital Status: Married    Spouse Name: N/A    Number of Children: N/A  . Years of Education: N/A   Occupational History  . Not on file.   Social History Main Topics  . Smoking status: Never Smoker   . Smokeless tobacco: Never Used  . Alcohol Use: 2.4 oz/week    4 Glasses of wine per week  . Drug Use: No  . Sexual Activity:  No   Other Topics Concern  . Not on file   Social History Narrative  . No narrative on file    Family History  Problem Relation Age of Onset  . Emphysema Father 16  . CVA Mother   . Cancer Brother     ? type    Current Outpatient Prescriptions on File Prior to Visit  Medication Sig Dispense Refill  . aspirin EC 81 MG tablet Take 81 mg by mouth daily.      . colesevelam (WELCHOL) 625 MG tablet Take 1,875 mg by mouth 2 (two) times daily with a meal.      . ezetimibe (ZETIA) 10 MG tablet Take 10 mg by mouth at bedtime.      [name] lisinopril-hydrochlorothiazide (PRINZIDE,ZESTORETIC) 20-25 MG per tablet Take 1 tablet by mouth daily.      . methocarbamol (ROBAXIN) 500 MG tablet Take 1 tablet (500 mg total) by mouth 3 (three) times daily as needed for muscle spasms.  30 tablet  0  . rosuvastatin (CRESTOR) 5 MG tablet Take 5 mg by mouth at bedtime.      . saccharomyces boulardii (FLORASTOR) 250 MG capsule Take 1 capsule (250 mg total) by mouth 2 (two) times daily.  30 capsule  0  . vancomycin (VANCOCIN) 50  mg/mL oral solution Take 2.5 mLs (125 mg total) by mouth every 6 (six) hours.  100 mL  0   No current facility-administered medications on file prior to visit.    Allergies  Allergen Reactions  . Codeine     GI upset    REVIEW OF SYSTEMS:  (Positives checked otherwise negative)  CARDIOVASCULAR:   chest pain,  chest pressure,  palpitations,  shortness of breath when laying flat,  shortness of breath with exertion,    pain in feet when walking,  pain in feet when laying flat,  history of blood clot in veins (DVT),  history of phlebitis,  swelling in legs,  varicose veins  PULMONARY:   productive cough,  asthma,  wheezing  NEUROLOGIC:   weakness in arms or legs,  numbness in arms or legs,  difficulty speaking or slurred speech,  temporary loss of vision in one eye,  dizziness  HEMATOLOGIC:   bleeding problems,  problems  with blood clotting too easily  MUSCULOSKEL:   joint pain,  joint swelling  GASTROINTEST:    Vomiting blood,   Blood in stool     GENITOURINARY:    Burning with urination,   Blood in urine  PSYCHIATRIC:   history of major depression  INTEGUMENTARY:   rashes,  ulcers  CONSTITUTIONAL:   fever,  chills  For VQI Use Only  PRE-ADM LIVING: Home  AMB STATUS: Ambulatory  CAD Sx: None  PRIOR CHF: None  STRESS TEST:  No,  Normal,  + ischemia,  + MI,  Both   Physical Examination Filed Vitals:   08/30/14 1229  BP: 155/89  Pulse: 82  Resp: 16  Height:  (1.499 m)  Weight: 126 lb (57.153 kg)   Body mass index is 25.44 kg/(m^2).  General: A&O x 3, WDWN  Head: [place]/AT  Ear/Nose/Throat: Hearing grossly intact, nares w/o erythema or drainage, oropharynx w/o Erythema/Exudate  Eyes: PERRLA, EOMI  Neck: Supple, no nuchal rigidity, no palpable LAD  Pulmonary: Sym exp, good air movt, CTAB, no rales, rhonchi, & wheezing  Cardiac: RRR, Nl S1, S2, no Murmurs, rubs or gallops  Vascular: Vessel Right Left  Radial Palpable Palpable  Brachial  Palpable Palpable  Carotid Palpable, without bruit Palpable, without bruit  Aorta Not palpable N/A  Femoral Palpable Palpable  Popliteal Not palpable Not palpable  PT Not Palpable Not Palpable  DP Not Palpable Faintly Palpable   Gastrointestinal: soft, NTND, -G/R, - HSM, - masses, - CVAT B  Musculoskeletal: M/S 5/5 throughout , Extremities without ischemic changes , cyanotic feet, spider veins and varicosities B, mild LDS  Neurologic: CN 2-12 intact , Pain and light touch intact in extremities , Motor exam as listed above  Psychiatric: Judgment intact, Mood & affect appropriatefor pt's clinical situation  Dermatologic: See M/S exam for extremity exam, no rashes otherwise noted  Lymph : No Cervical, Axillary, or Inguinal lymphadenopathy   Non-Invasive Vascular Imaging  Outside ABI  (Date: 07/30/14)  R: 0.7, DP: mono, PT: mono, TBI: 0.45  L: 0.78, DP: tri, PT: tri, TBI: 0.55  BLE arterial duplex (08/30/2014)  RLE: diffusely diseased SFA, with monophasic flow throughout tibials and popliteal artery  LLE: diffusely diseased SFA, with mixed monophasic and biphasic flow throughout tibials and popliteal artery  Outside Studies/Documentation 10 pages of  outside documents were reviewed including: outside ABI and outpatient chart.  Medical Decision Making  Sydney Gross is a 80 y.o. female who presents with: BLE intermittent claudication (L>R)   I discussed with the patient the natural history of intermittent claudication: 75% of patients have stable or improved symptoms in a year an only 2% require amputation. Eventually 20% may require intervention in a year.  I discussed in depth with the patient the nature of atherosclerosis, and emphasized the importance of maximal medical management including strict control of blood pressure, blood glucose, and lipid levels, antiplatelet agent, obtaining regular exercise, and cessation of smoking.    The patient is aware that without maximal medical management the underlying atherosclerotic disease process will progress, limiting the benefit of any interventions.  I discussed in depth with the patient a walking plan and how to execute such. The patient is currently not on a statin rather on Wellchol.  Patient isn't interest in switching. The patient is currently on an anti-platelet: ASA. The patient will follow up in 3 months for continued surveillance and re-evaluation. We discussed the signs of critical limb ischemia and she will follow up with [name] if she develops any of these problems.  Thank you for allowing [name] to participate in this patient's care.  Leonides Sake, MD [facility] Office: [phone] Pager: [phone]  08/30/2014, 1:20 PM

## 2014-11-15 ENCOUNTER — Ambulatory Visit: Payer: Medicare Other | Admitting: Interventional Cardiology

## 2014-12-13 ENCOUNTER — Ambulatory Visit: Payer: Medicare Other | Admitting: Vascular Surgery

## 2014-12-13 ENCOUNTER — Encounter (HOSPITAL_COMMUNITY): Payer: Medicare Other

## 2014-12-16 ENCOUNTER — Encounter: Payer: Self-pay | Admitting: Interventional Cardiology

## 2014-12-16 ENCOUNTER — Ambulatory Visit (INDEPENDENT_AMBULATORY_CARE_PROVIDER_SITE_OTHER): Payer: Medicare Other | Admitting: Interventional Cardiology

## 2014-12-16 ENCOUNTER — Other Ambulatory Visit: Payer: Self-pay | Admitting: *Deleted

## 2014-12-16 VITALS — BP 120/90 | HR 74 | Ht 59.0 in | Wt 124.0 lb

## 2014-12-16 DIAGNOSIS — Q253 Supravalvular aortic stenosis: Secondary | ICD-10-CM | POA: Insufficient documentation

## 2014-12-16 DIAGNOSIS — I10 Essential (primary) hypertension: Secondary | ICD-10-CM

## 2014-12-16 DIAGNOSIS — R0600 Dyspnea, unspecified: Secondary | ICD-10-CM

## 2014-12-16 DIAGNOSIS — R0609 Other forms of dyspnea: Secondary | ICD-10-CM

## 2014-12-16 DIAGNOSIS — I70219 Atherosclerosis of native arteries of extremities with intermittent claudication, unspecified extremity: Secondary | ICD-10-CM

## 2014-12-16 DIAGNOSIS — R011 Cardiac murmur, unspecified: Secondary | ICD-10-CM

## 2014-12-16 DIAGNOSIS — E785 Hyperlipidemia, unspecified: Secondary | ICD-10-CM

## 2014-12-16 NOTE — Patient Instructions (Signed)
Your physician recommends that you continue on your current medications as directed. Please refer to the Current Medication list given to you today.  Your physician has requested that you have an echocardiogram. Echocardiography is a painless test that uses sound waves to create images of your heart. It provides your doctor with information about the size and shape of your heart and how well your heart's chambers and valves are working. This procedure takes approximately one hour. There are no restrictions for this procedure.   Your physician has requested that you have a lexiscan myoview. For further information please visit [email or web address]. Please follow instruction sheet, as given.  Follow up pending results

## 2014-12-16 NOTE — Progress Notes (Signed)
Patient ID: Sydney Gross, female   DOB: 1930/01/06, 81 y.o.   MRN: [id number]   Date: 12/16/2014 ID: Sydney Gross, DOB [birth year], MRN [id number] PCP: Lupita Raider, MD  Reason: Dyspnea  ASSESSMENT;  1. Exertional dyspnea of uncertain etiology 2. COPD 3. Peripheral arterial disease with claudication 4. Apical systolic murmur 5. Hyperlipidemia with LDL cholesterols in the 200 range on multiple blood tests done in the past between 2000 and 2013. Statin intolerances been noted.  PLAN:  1. 2-D Doppler echocardiogram to assess the apical systolic murmur and rule out aortic stenosis, hypertrophic cardiomyopathy, and mitral regurgitation 2. Myocardial perfusion imaging with pharmacologic stress   SUBJECTIVE: Sydney Gross is a 81 y.o. female who is referred for exertional dyspnea. She denies chest discomfort. A diagnosis of COPD has been made but there is no prior history of smoking. She does have peripheral arterial disease documented by Doppler imaging within the past 6 months. Claudication has improved on Pletal/cilostazol. She denies orthopnea. Is no peripheral edema. No history of heart failure or rheumatic fever as a child. She has never had any pathologic arrhythmia, i.e. atrial fib or PAT. He is accompanied by her husband who has a history of CAD and prior LAD stent. Her family history is significant for lung cancer, stroke, and hypertension.   Allergies  Allergen Reactions  . Codeine     GI upset  . Crestor [Rosuvastatin] Other (See Comments)    MYALGIAS  . Darvon [Propoxyphene] Nausea Only  . Lipitor [Atorvastatin] Other (See Comments)    MYALGIAS  . Zocor [Simvastatin] Other (See Comments)    MYALGIAS    Current Outpatient Prescriptions on File Prior to Visit  Medication Sig Dispense Refill  . aspirin EC 81 MG tablet Take 81 mg by mouth daily.    [name] lisinopril-hydrochlorothiazide (PRINZIDE,ZESTORETIC) 20-25 MG per tablet Take 1 tablet by mouth daily.     No  current facility-administered medications on file prior to visit.    Past Medical History  Diagnosis Date  . Hyperlipidemia   . Hypertension   . Colitis 12/2013    "never had this before"  . Family history of anesthesia complication     "mother; think they gave her too much; c/o pain after OR; gave her more RX; they had to bring her back real quick"  . COPD (chronic obstructive pulmonary disease)   . Exertional shortness of breath     "off and on" (12/10/2013)  . Arthritis     "maybe a little in my knees and back" (12/10/2013)  . Hiatal hernia   . Stenosis of tear duct   . Shingles   . H/O calcium pyrophosphate deposition disease (CPPD)   . DOE (dyspnea on exertion)   . PAD (peripheral artery disease)   . Allergic rhinitis   . Proteinuria     Past Surgical History  Procedure Laterality Date  . Tonsillectomy and adenoidectomy  ?1940's  . Cholecystectomy  ~ 2009  . Back surgery    . Lumbar disc surgery  ?2004  . Abdominal hysterectomy  1960's  . Cesarean section  7829; 1960    History   Social History  . Marital Status: Married    Spouse Name: N/A    Number of Children: N/A  . Years of Education: N/A   Occupational History  . Not on file.   Social History Main Topics  . Smoking status: Never Smoker   . Smokeless tobacco: Never Used  . Alcohol Use: 2.4 oz/week  4 Glasses of wine per week  . Drug Use: No  . Sexual Activity: No   Other Topics Concern  . Not on file   Social History Narrative    Family History  Problem Relation Age of Onset  . Emphysema Father 6969  . CVA Mother   . Cancer Brother     ? type    ROS: Stable appetite. Denies weight loss. No history of stroke. No difficulty with lower extremity ulcers or nonhealing skin lesions. Denies chest pain.. Other systems negative for complaints.  OBJECTIVE: BP 120/90 mmHg  Pulse 74  Ht 4\' 11"  (1.499 m)  Wt 124 lb (56.246 kg)  BMI 25.03 kg/m2,  General: No acute distress, elderly but appearing  younger than stated age HEENT: normal without jaundice or pallor Neck: JVD flat. Carotids absent Chest: Clear Cardiac: Murmur: 2/6 apical systolic murmur. Gallop: S4 gallop. Rhythm: Normal. Other: Normal Abdomen: Bruit: Absent. Pulsation: Absent Extremities: Edema: None. Pulses: 2+ and symmetric in upper extremities. Pedal pulses are trace to absent Neuro: Normal Psych: Normal  ECG: Normal sinus rhythm

## 2014-12-25 ENCOUNTER — Ambulatory Visit (HOSPITAL_BASED_OUTPATIENT_CLINIC_OR_DEPARTMENT_OTHER): Payer: Medicare Other | Admitting: Radiology

## 2014-12-25 ENCOUNTER — Encounter: Payer: Self-pay | Admitting: Interventional Cardiology

## 2014-12-25 ENCOUNTER — Ambulatory Visit (HOSPITAL_COMMUNITY): Payer: Medicare Other | Attending: Family Medicine | Admitting: Radiology

## 2014-12-25 DIAGNOSIS — E785 Hyperlipidemia, unspecified: Secondary | ICD-10-CM | POA: Insufficient documentation

## 2014-12-25 DIAGNOSIS — R0600 Dyspnea, unspecified: Secondary | ICD-10-CM

## 2014-12-25 DIAGNOSIS — J449 Chronic obstructive pulmonary disease, unspecified: Secondary | ICD-10-CM | POA: Insufficient documentation

## 2014-12-25 DIAGNOSIS — I10 Essential (primary) hypertension: Secondary | ICD-10-CM | POA: Diagnosis not present

## 2014-12-25 DIAGNOSIS — R0609 Other forms of dyspnea: Secondary | ICD-10-CM | POA: Diagnosis not present

## 2014-12-25 DIAGNOSIS — I70219 Atherosclerosis of native arteries of extremities with intermittent claudication, unspecified extremity: Secondary | ICD-10-CM

## 2014-12-25 DIAGNOSIS — R011 Cardiac murmur, unspecified: Secondary | ICD-10-CM

## 2014-12-25 MED ORDER — TECHNETIUM TC 99M SESTAMIBI GENERIC - CARDIOLITE
11.0000 | Freq: Once | INTRAVENOUS | Status: AC | PRN
  Administered 2014-12-25: 11 via INTRAVENOUS

## 2014-12-25 MED ORDER — REGADENOSON 0.4 MG/5ML IV SOLN
0.4000 mg | Freq: Once | INTRAVENOUS | Status: AC
  Administered 2014-12-25: 0.4 mg via INTRAVENOUS

## 2014-12-25 MED ORDER — TECHNETIUM TC 99M SESTAMIBI GENERIC - CARDIOLITE
33.0000 | Freq: Once | INTRAVENOUS | Status: AC | PRN
Start: 2014-12-25 — End: 2014-12-25
  Administered 2014-12-25: 33 via INTRAVENOUS

## 2014-12-25 NOTE — Progress Notes (Signed)
[facility] SITE 3 NUCLEAR MED [address] [place], [place] [address] [phone]    Cardiology Nuclear Med Study  Sydney Gross is a 81 y.o. female     MRN : [id number]     DOB: February 02, 1930  Procedure Date: 12/25/2014  Nuclear Med Background Indication for Stress Test:  Evaluation for Ischemia History:  COPD and n/a Cardiac Risk Factors: Hypertension and Lipids  Symptoms:  DOE   Nuclear Pre-Procedure Caffeine/Decaff Intake:  None> 12 hrs NPO After: 8:00pm   Lungs:  clear O2 Sat: 97% on room air. IV 0.9% NS with Angio Cath:  22g  IV Site: R Antecubital x 1, tolerated well IV Started by:  Irean HongPatsy Edwards, RN  Chest Size (in):  34 Cup Size: C  Height: 4\' 11"  (1.499 m)  Weight:  122 lb (55.339 kg)  BMI:  Body mass index is 24.63 kg/(m^2). Tech Comments:  N/A    Nuclear Med Study 1 or 2 day study: 1 day  Stress Test Type:  Lexiscan  Reading MD: N/A  Order Authorizing Provider:  Verdis PrimeHenry Smith, III, MD  Resting Radionuclide: Technetium 7142m Sestamibi  Resting Radionuclide Dose: 11.0 mCi   Stress Radionuclide:  Technetium 7842m Sestamibi  Stress Radionuclide Dose: 33.0 mCi           Stress Protocol Rest HR: 86 Stress HR: 111  Rest BP: 155/77 Stress BP: 154/77  Exercise Time (min): n/a METS: n/a   Predicted Max HR: 138 bpm % Max HR: 80.43 bpm Rate Pressure Product: [id number]   Dose of Adenosine (mg):  n/a Dose of Lexiscan: 0.4 mg  Dose of Atropine (mg): n/a Dose of Dobutamine: n/a mcg/kg/min (at max HR)  Stress Test Technologist: Milana NaSabrina Williams, EMT-P  Nuclear Technologist:  Kerby NoraElzbieta Kubak, CNMT     Rest Procedure:  Myocardial perfusion imaging was performed at rest 45 minutes following the intravenous administration of Technetium 5342m Sestamibi. Rest ECG: NSR - Normal EKG  Stress Procedure:  The patient received IV Lexiscan 0.4 mg over 15-seconds.  Technetium 7442m Sestamibi injected at 30-seconds. This patient had nausea and abdominal burning with the Lexiscan  injection. Quantitative spect images were obtained after a 45 minute delay. Stress ECG: No significant change from baseline ECG  QPS Raw Data Images:  Normal; no motion artifact; normal heart/lung ratio. Stress Images:  Normal homogeneous uptake in all areas of the myocardium. Rest Images:  Normal homogeneous uptake in all areas of the myocardium. Subtraction (SDS):  There is no evidence of scar or ischemia. Transient Ischemic Dilatation (Normal <1.22):  0.97 Lung/Heart Ratio (Normal <0.45):  0.26  Quantitative Gated Spect Images QGS EDV:  47 ml QGS ESV:  9 ml  Impression Exercise Capacity:  Lexiscan with no exercise. BP Response:  Hypotensive blood pressure response. Clinical Symptoms:  Nausea, abdominal discomfort.  ECG Impression:  No significant ST segment change suggestive of ischemia. Rare PVCs.  Comparison with Prior Nuclear Study: No images to compare  Overall Impression:  Normal stress nuclear study.  LV Ejection Fraction: 81%.  LV Wall Motion:  NL LV Function; NL Wall Motion   Marca AnconaDalton Rhetta Cleek 12/25/2014

## 2014-12-25 NOTE — Progress Notes (Signed)
Echocardiogram performed.  

## 2014-12-30 ENCOUNTER — Telehealth: Payer: Self-pay | Admitting: Interventional Cardiology

## 2014-12-30 MED ORDER — METOPROLOL TARTRATE 25 MG PO TABS: 12.5000 mg | ORAL_TABLET | Freq: Two times a day (BID) | ORAL | Status: DC

## 2014-12-30 NOTE — Telephone Encounter (Signed)
Pt aware of echos ad myoview results. The nuclear study is normal Overall, based on echo results, dyspnea is partially related to the heart murmur and increased muscle thickness. Start metoprolol tartrate 12.5 mg twice a day. If dyspnea gets worse, stop the medication. If not already arranged, she needs an office visit in approximately one month  Rx sent to pt pharmacy. F/u appt schedule on 2/22 @ 10:45am with Dr.Smith

## 2014-12-30 NOTE — Telephone Encounter (Signed)
New message ° ° ° ° °Want test results °

## 2015-01-02 ENCOUNTER — Encounter: Payer: Self-pay | Admitting: Vascular Surgery

## 2015-01-03 ENCOUNTER — Ambulatory Visit (HOSPITAL_COMMUNITY)
Admission: RE | Admit: 2015-01-03 | Discharge: 2015-01-03 | Disposition: A | Discharge status: Home / Self Care | Payer: Medicare Other | Source: Ambulatory Visit | Attending: Vascular Surgery | Admitting: Vascular Surgery

## 2015-01-03 ENCOUNTER — Ambulatory Visit (INDEPENDENT_AMBULATORY_CARE_PROVIDER_SITE_OTHER): Payer: Medicare Other | Admitting: Vascular Surgery

## 2015-01-03 ENCOUNTER — Encounter: Payer: Self-pay | Admitting: Vascular Surgery

## 2015-01-03 VITALS — BP 160/85 | HR 78 | Ht 59.0 in | Wt 125.9 lb

## 2015-01-03 DIAGNOSIS — I70219 Atherosclerosis of native arteries of extremities with intermittent claudication, unspecified extremity: Secondary | ICD-10-CM

## 2015-01-03 NOTE — Addendum Note (Signed)
Addended by: Sharee PimpleMCCHESNEY, MARILYN K on: 01/03/2015 04:51 PM   Modules accepted: Orders

## 2015-01-03 NOTE — Progress Notes (Signed)
    Established Intermittent Claudication  History of Present Illness  Sydney Gross is a 81 y.o. (06/07/1930) female who presents with chief complaint: continued calf cramping.  This patient continues with her extensive exercise regimen with stable calf claudication unchanged.  The patient's treatment regimen currently included: maximal medical management and walking plan.  The patient's PMH, PSH, SH, FamHx, Med, and Allergies are unchanged from 08/30/14.  On ROS today: no rest pain, stable intermittent claudication, no wounds  Physical Examination  Filed Vitals:   01/03/15 1520  BP: 160/85  Pulse: 78  Height: 4\' 11"  (1.499 m)  Weight: 125 lb 14.4 oz (57.108 kg)  SpO2: 99%   Body mass index is 25.42 kg/(m^2).  General: A&O x 3, WDWN  Pulmonary: Sym exp, good air movt, CTAB, no rales, rhonchi, & wheezing  Cardiac: RRR, Nl S1, S2, no Murmurs, rubs or gallops  Vascular: Vessel Right Left  Radial Palpable Palpable  Brachial Palpable Palpable  Carotid Palpable, without bruit Palpable, without bruit  Aorta Not palpable N/A  Femoral Palpable Palpable  Popliteal Not palpable Not palpable  PT Palpable Palpable  DP Palpable Palpable   Gastrointestinal: soft, NTND, -G/R, - HSM, - masses, - CVAT B  Musculoskeletal: M/S 5/5 throughout , Extremities without ischemic changes   Neurologic: Pain and light touch intact in extremities , Motor exam as listed above  Non-Invasive Vascular Imaging ABI (Date: 01/03/2015)  R: 0.77 (0.70), DP: mono, PT: mono, TBI: 0.45  L: 0.84 (0.78), DP: bi, PT: bi, TBI: 0.57  Medical Decision Making  Sydney Gross is a 81 y.o. female who presents with:  right leg intermittent claudication without evidence of critical limb ischemia.  Based on the patient's vascular studies and examination, I have offered the patient: q6 month ABI.  I discussed in depth with the patient the nature of atherosclerosis, and emphasized the importance of maximal  medical management including strict control of blood pressure, blood glucose, and lipid levels, antiplatelet agents, obtaining regular exercise, and cessation of smoking.    The patient is aware that without maximal medical management the underlying atherosclerotic disease process will progress, limiting the benefit of any interventions. The patient is currently not on a statin as pt on Wellchol. The patient is currently on an anti-platelet: ASA. Patient is also on Pletal.  Thank you for allowing us to participate in this patient's care.  Leonides SakeBrian Nikko Quast, MD [facility] Office: [phone] Pager: [phone]  01/03/2015, 3:41 PM

## 2015-01-27 ENCOUNTER — Encounter: Payer: Self-pay | Admitting: Interventional Cardiology

## 2015-01-27 ENCOUNTER — Ambulatory Visit (INDEPENDENT_AMBULATORY_CARE_PROVIDER_SITE_OTHER): Payer: Medicare Other | Admitting: Interventional Cardiology

## 2015-01-27 VITALS — BP 150/86 | HR 60 | Ht 59.0 in | Wt 125.0 lb

## 2015-01-27 DIAGNOSIS — I10 Essential (primary) hypertension: Secondary | ICD-10-CM

## 2015-01-27 DIAGNOSIS — I7789 Other specified disorders of arteries and arterioles: Secondary | ICD-10-CM

## 2015-01-27 DIAGNOSIS — I70219 Atherosclerosis of native arteries of extremities with intermittent claudication, unspecified extremity: Secondary | ICD-10-CM

## 2015-01-27 DIAGNOSIS — I77810 Thoracic aortic ectasia: Secondary | ICD-10-CM

## 2015-01-27 DIAGNOSIS — I77819 Aortic ectasia, unspecified site: Secondary | ICD-10-CM

## 2015-01-27 DIAGNOSIS — Q253 Supravalvular aortic stenosis: Secondary | ICD-10-CM

## 2015-01-27 NOTE — Patient Instructions (Addendum)
Your physician recommends that you continue on your current medications as directed. Please refer to the Current Medication list given to you today.   Your physician has requested that you have an echocardiogram. Echocardiography is a painless test that uses sound waves to create images of your heart. It provides your doctor with information about the size and shape of your heart and how well your heart's chambers and valves are working. This procedure takes approximately one hour. There are no restrictions for this procedure.( TO BE SCHEDULED IN JAN 2017)   Your physician wants you to follow-up in: 1 year with Dr.Smith You will receive a reminder letter in the mail two months in advance. If you don't receive a letter, please call our office to schedule the follow-up appointment.

## 2015-01-27 NOTE — Progress Notes (Signed)
Cardiology Office Note   Date:  01/27/2015   ID:  Sydney Gross, DOB 02/20/1930, MRN [id number]  PCP:  Lupita Raider, MD  Cardiologist:   Lesleigh Noe, MD   No chief complaint on file.     History of Present Illness: Sydney Gross is a 81 y.o. female who presents for dyspnea. Recent workup was negative for CAD and left ventricular systolic dysfunction by nuclear and echocardiography. Coincidentally, we identified aortic root enlargement at 3.9 cm. Beta blocker therapy was started. She tolerated 25 mg twice a day without difficulty. The dose that was prescribed however was 12.5 mg twice a day. Beta blocker therapy did not help the dyspnea. He did not make dyspnea worse. She does have COPD.    Past Medical History  Diagnosis Date  . Hyperlipidemia   . Hypertension   . Colitis 12/2013    "never had this before"  . Family history of anesthesia complication     "mother; think they gave her too much; c/o pain after OR; gave her more RX; they had to bring her back real quick"  . COPD (chronic obstructive pulmonary disease)   . Exertional shortness of breath     "off and on" (12/10/2013)  . Arthritis     "maybe a little in my knees and back" (12/10/2013)  . Hiatal hernia   . Stenosis of tear duct   . Shingles   . H/O calcium pyrophosphate deposition disease (CPPD)   . DOE (dyspnea on exertion)   . PAD (peripheral artery disease)   . Allergic rhinitis   . Proteinuria     Past Surgical History  Procedure Laterality Date  . Tonsillectomy and adenoidectomy  ?1940's  . Cholecystectomy  ~ 2009  . Back surgery    . Lumbar disc surgery  ?2004  . Abdominal hysterectomy  1960's  . Cesarean section  [id number]; 1960     Current Outpatient Prescriptions  Medication Sig Dispense Refill  . aspirin EC 81 MG tablet Take 81 mg by mouth daily.    . budesonide-formoterol (SYMBICORT) 160-4.5 MCG/ACT inhaler Inhale 2 puffs into the lungs 2 (two) times daily.    . cilostazol (PLETAL)  50 MG tablet Take 50 mg by mouth 2 (two) times daily.   6  . diphenhydramine-acetaminophen (TYLENOL PM) 25-500 MG TABS Take 1 tablet by mouth at bedtime and may repeat dose one time if needed.    [name] lisinopril-hydrochlorothiazide (PRINZIDE,ZESTORETIC) 20-25 MG per tablet Take 1 tablet by mouth daily.    . metoprolol tartrate (LOPRESSOR) 25 MG tablet Take 0.5 tablets (12.5 mg total) by mouth 2 (two) times daily. 30 tablet 5  . naproxen (NAPROSYN) 500 MG tablet Take 500 mg by mouth as needed.     No current facility-administered medications for this visit.    Allergies:   Codeine; Crestor; Darvon; Lipitor; and Zocor    Social History:  The patient  reports that she has never smoked. She has never used smokeless tobacco. She reports that she drinks about 2.4 oz of alcohol per week. She reports that she does not use illicit drugs.   Family History:  The patient's family history includes CVA in her mother; Cancer in her brother; Emphysema (age of onset: 68) in her father.    ROS:  Please see the history of present illness.   Otherwise, review of systems are positive for none.   All other systems are reviewed and negative.    PHYSICAL EXAM: VS:  BP  150/86 mmHg  Pulse 60  Ht 4\' 11"  (1.499 m)  Wt 125 lb (56.7 kg)  BMI 25.23 kg/m2 , BMI Body mass index is 25.23 kg/(m^2). 138/78 GEN: Well nourished, well developed, in no acute distress HEENT: normal Neck: no JVD, carotid bruits, or masses Cardiac: RRR; no murmurs, rubs, or gallops,no edema  Respiratory:  clear to auscultation bilaterally, normal work of breathing GI: soft, nontender, nondistended, + BS MS: no deformity or atrophy Skin: warm and dry, no rash Neuro:  Strength and sensation are intact Psych: euthymic mood, full affect   EKG:  EKG is not ordered today.    Recent Labs: No results found for requested labs within last 365 days.    Lipid Panel No results found for: CHOL, TRIG, HDL, CHOLHDL, VLDL, LDLCALC, LDLDIRECT     Wt Readings from Last 3 Encounters:  01/27/15 125 lb (56.7 kg)  01/03/15 125 lb 14.4 oz (57.108 kg)  12/25/14 122 lb (55.339 kg)      Other studies Reviewed: Additional studies/ records that were reviewed today include: . Review of the above records demonstrates: Aortic root enlargement   ASSESSMENT AND PLAN:  1. Ascending aortic enlargement and evidence of mild supravalvular aortic stenosis. Low-dose beta blocker therapy started to prevent progressive expansion of the aorta. 2. Dyspnea of undetermined cause. Probably related to COPD 3. Hypertension, with poor systolic control. Will need tighter blood pressure control 4. Supravalvular aortic stenosis, mild by recent echo   Current medicines are reviewed at length with the patient today.  The patient does not have concerns regarding medicines.  The following changes have been made:  no change  Labs/ tests ordered today include: Echocardiogram on return in one year   Orders Placed This Encounter  Procedures  . 2D Echocardiogram without contrast     Disposition:   FU with Mendel RyderH. Shonita Rinck in 12 months   Signed, Lesleigh NoeSMITH III,Navy Rothschild W, MD  01/27/2015 11:35 AM    [facility] [address], [place], [place]  [address] Phone: [phone]) [phone]; Fax: [phone]

## 2015-05-29 NOTE — Addendum Note (Signed)
Addended by: Reesa Chew on: 05/29/2015 10:28 AM   Modules accepted: Orders

## 2015-06-15 ENCOUNTER — Other Ambulatory Visit: Payer: Self-pay | Admitting: Interventional Cardiology

## 2015-06-16 ENCOUNTER — Other Ambulatory Visit: Payer: Self-pay

## 2015-06-16 MED ORDER — METOPROLOL TARTRATE 25 MG PO TABS: 12.5000 mg | ORAL_TABLET | Freq: Two times a day (BID) | ORAL | Status: DC

## 2015-07-04 ENCOUNTER — Ambulatory Visit: Payer: Medicare Other | Admitting: Family

## 2015-07-04 ENCOUNTER — Encounter (HOSPITAL_COMMUNITY): Payer: Medicare Other

## 2015-07-25 ENCOUNTER — Ambulatory Visit: Payer: Medicare Other | Admitting: Family

## 2015-07-25 ENCOUNTER — Encounter (HOSPITAL_COMMUNITY): Payer: Medicare Other

## 2015-07-28 ENCOUNTER — Encounter: Payer: Self-pay | Admitting: Family

## 2015-07-29 ENCOUNTER — Ambulatory Visit (INDEPENDENT_AMBULATORY_CARE_PROVIDER_SITE_OTHER): Payer: Medicare Other | Admitting: Family

## 2015-07-29 ENCOUNTER — Ambulatory Visit (HOSPITAL_COMMUNITY)
Admission: RE | Admit: 2015-07-29 | Discharge: 2015-07-29 | Disposition: A | Discharge status: Home / Self Care | Payer: Medicare Other | Source: Ambulatory Visit | Attending: Vascular Surgery | Admitting: Vascular Surgery

## 2015-07-29 ENCOUNTER — Encounter: Payer: Self-pay | Admitting: Family

## 2015-07-29 VITALS — BP 131/77 | HR 58 | Temp 98.2°F | Resp 14 | Ht 59.0 in | Wt 127.0 lb

## 2015-07-29 DIAGNOSIS — Z8349 Family history of other endocrine, nutritional and metabolic diseases: Secondary | ICD-10-CM | POA: Diagnosis not present

## 2015-07-29 DIAGNOSIS — Z83438 Family history of other disorder of lipoprotein metabolism and other lipidemia: Secondary | ICD-10-CM

## 2015-07-29 DIAGNOSIS — I70219 Atherosclerosis of native arteries of extremities with intermittent claudication, unspecified extremity: Secondary | ICD-10-CM

## 2015-07-29 NOTE — Progress Notes (Signed)
[facility] [place] HISTORY AND PHYSICAL -PAD  History of Present Illness Sydney Gross is a 81 y.o. female patient of Dr. Imogene Burn who presents with chief complaint: continued calf cramping. This patient continues with her extensive exercise regimen with stable calf claudication unchanged. The patient's treatment regimen currently included: maximal medical management and walking plan. After walking about 10 minutes she develops bilateral calf pain which resolves with rest. She walks about 15 minutes most days of the week on a track, also works out in a gym. She denies non healing wounds. Pt denies any history of stroke or TIA, denies any cardiac problems or MI.  Pt has not had previous peripheral vascular intervention .  The patient denies New Medical or Surgical History.  Pt Diabetic: No Pt smoker: non-smoker  Pt meds include: Statin :No, statins cause myalgias Betablocker: Yes ASA: Yes Other anticoagulants/antiplatelets: Pletal  Past Medical History  Diagnosis Date  . Hyperlipidemia   . Hypertension   . Colitis 12/2013    "never had this before"  . Family history of anesthesia complication     "mother; think they gave her too much; c/o pain after OR; gave her more RX; they had to bring her back real quick"  . COPD (chronic obstructive pulmonary disease)   . Exertional shortness of breath     "off and on" (12/10/2013)  . Arthritis     "maybe a little in my knees and back" (12/10/2013)  . Hiatal hernia   . Stenosis of tear duct   . Shingles   . H/O calcium pyrophosphate deposition disease (CPPD)   . DOE (dyspnea on exertion)   . PAD (peripheral artery disease)   . Allergic rhinitis   . Proteinuria     Social History Social History  Substance Use Topics  . Smoking status: Never Smoker   . Smokeless tobacco: Never Used  . Alcohol Use: 2.4 oz/week    4 Glasses of wine per week    Family History Family History  Problem Relation Age of Onset  .  Emphysema Father 65  . CVA Mother   . Cancer Brother     ? type    Past Surgical History  Procedure Laterality Date  . Tonsillectomy and adenoidectomy  ?1940's  . Cholecystectomy  ~ 2009  . Back surgery    . Lumbar disc surgery  ?2004  . Abdominal hysterectomy  1960's  . Cesarean section  6213; 1960    Allergies  Allergen Reactions  . Codeine     GI upset  . Crestor [Rosuvastatin] Other (See Comments)    MYALGIAS  . Darvon [Propoxyphene] Nausea Only  . Lipitor [Atorvastatin] Other (See Comments)    MYALGIAS  . Zocor [Simvastatin] Other (See Comments)    MYALGIAS    Current Outpatient Prescriptions  Medication Sig Dispense Refill  . aspirin EC 81 MG tablet Take 81 mg by mouth daily.    . budesonide-formoterol (SYMBICORT) 160-4.5 MCG/ACT inhaler Inhale 2 puffs into the lungs 2 (two) times daily.    . cilostazol (PLETAL) 50 MG tablet Take 50 mg by mouth 2 (two) times daily.   6  . diphenhydramine-acetaminophen (TYLENOL PM) 25-500 MG TABS Take 1 tablet by mouth at bedtime and may repeat dose one time if needed.    [name] lisinopril-hydrochlorothiazide (PRINZIDE,ZESTORETIC) 20-25 MG per tablet Take 1 tablet by mouth daily.    . metoprolol tartrate (LOPRESSOR) 25 MG tablet Take 0.5 tablets (12.5 mg total) by mouth 2 (two)  times daily. 30 tablet 5  . naproxen (NAPROSYN) 500 MG tablet Take 500 mg by mouth as needed.     No current facility-administered medications for this visit.    ROS: See HPI for pertinent positives and negatives.   Physical Examination  Filed Vitals:   07/29/15 1056  BP: 131/77  Pulse: 58  Temp: 98.2 F (36.8 C)  TempSrc: Oral  Resp: 14  Height: 4\' 11"  (1.499 m)  Weight: 127 lb (57.607 kg)  SpO2: 100%   Body mass index is 25.64 kg/(m^2).  General: A&O x 3, WDWN. Gait: normal Eyes: PERRLA. Pulmonary: CTAB, without wheezes , rales or rhonchi. Cardiac: regular Rhythm, rare premature and missed contractions, no detected murmur.         Carotid  Bruits Right Left   Negative Negative  Aorta is not palpable. Radial pulses: 2+ palpable and =                           VASCULAR EXAM: Extremities without ischemic changes , without Gangrene; without open wounds.                                                                                                          LE Pulses Right Left       FEMORAL  2+ palpable  2+ palpable        POPLITEAL  not palpable   not palpable       POSTERIOR TIBIAL  biphasic by Doppler and not palpable   biphasic by Doppler and not palpable        DORSALIS PEDIS      ANTERIOR TIBIAL monophasic by Doppler and not palpable  triphasic by Doppler and not palpable    Abdomen: soft, NT, no palpable masses. Skin: no rashes, no ulcers. Musculoskeletal: no muscle wasting or atrophy.  Neurologic: A&O X 3; Appropriate Affect; MOTOR FUNCTION:  moving all extremities equally, motor strength 5/5 throughout. Speech is fluent/normal. CN 2-12 intact.    Non-Invasive Vascular Imaging: DATE: 07/29/2015 ABI: RIGHT: 0.84 (0.77, 01/03/15), Waveforms: bi and monophasic, TBI: 0.75;  LEFT: 0.88 (0.84), Waveforms: bi and triphasic, TBI: 0.64   ASSESSMENT: Sydney Gross is a 81 y.o. female who presents with improving mild intermittent claudication of both calves, no signs of ischemia in her lower extremities. Today's ABI's are slightly improved from 6 months prior. She is continuing her walking and exercising. Her atherosclerotic risk factors include dyslipidemia, statin intolerance, and extensive family history of dyslipidemia. Fortunately she does not have DM and never used tobacco.    PLAN:  Continue graduated walking program.  Based on the patient's vascular studies and examination, pt will return to clinic in 1 year with ABI's.  I discussed in depth with the patient the nature of atherosclerosis, and emphasized the importance of maximal medical management including strict control of blood pressure, blood  glucose, and lipid levels, obtaining regular exercise, and continued cessation of smoking.  The patient is aware that without maximal medical management the underlying atherosclerotic  disease process will progress, limiting the benefit of any interventions.  The patient was given information about PAD including signs, symptoms, treatment, what symptoms should prompt the patient to seek immediate medical care, and risk reduction measures to take.  Charisse March, RN, MSN, FNP-C [facility] Phone: [phone]  Clinic MD: Hart Rochester  07/29/2015 11:11 AM

## 2015-07-29 NOTE — Addendum Note (Signed)
Addended by: Adria Dill L on: 07/29/2015 01:43 PM   Modules accepted: Orders

## 2015-07-29 NOTE — Patient Instructions (Signed)
Intermittent Claudication Blockage of leg arteries results from poor circulation of blood in the leg arteries. This produces an aching, tired, and sometimes burning pain in the legs that is brought on by exercise and made better by rest. Claudication refers to the limping that happens from leg cramps. It is also referred to as Vaso-occlusive disease of the legs, arterial insufficiency of the legs, recurrent leg pain, recurrent leg cramping and calf pain with exercise.  CAUSES  This condition is due to narrowing or blockage of the arteries (muscular vessels which carry blood away from the heart and around the body). Blockage of arteries can occur anywhere in the body. If they occur in the heart, a person may experience angina (chest pain) or even a heart attack. If they occur in the neck or the brain, a person may have a stroke. Intermittent claudication is when the blockage occurs in the legs, most commonly in the calf or the foot.  Atherosclerosis, or blockage of arteries, can occur for many reasons. Some of these are smoking, diabetes, and high cholesterol. SYMPTOMS  Intermittent claudication may occur in both legs, and it often continues to get worse over time. However, some people complain only of weakness in the legs when walking, or a feeling of "tiredness" in the buttocks. Impotence (not able to have an erection) is an occasional complaint in men. Pain while resting is uncommon.  WHAT TO EXPECT AT YOUR HEALTH CARE PROVIDER'S OFFICE: Your medical history will be asked for and a physical examination will be performed. Medical history questions documenting claudication in detail may include:   Time pattern  Do you have leg cramps at night (nocturnal cramps)?  How often does leg pain with cramping occur?  Is it getting worse?  What is the quality of the pain?  Is the pain sharp?  Is there an aching pain with the cramps?  Aggravating factors  Is it worse after you exercise?  Is it  worse after you are standing for a while?  Do you smoke? How much?  Do you drink alcohol? How much?  Are you diabetic? How well is your blood sugar controlled?  Other  What other symptoms are also present?  Has there been impotence (men)?  Is there pain in the back?  Is there a darkening of the skin of the legs, feet or toes?  Is there weakness or paralysis of the legs? The physical examination may include evaluation of the femoral pulse (in the groin) and the other areas where the pulse can be felt in the legs. DIAGNOSIS  Diagnostic tests that may be performed include:  Blood pressure measured in arms and legs for comparison.  Doppler ultrasonography on the legs and the heart.  Duplex Doppler/ultrasound exam of extremity to visualize arterial blood flow.  ECG- to evaluate the activity of your heart.  Aortography- to visualize blockages in your arteries. TREATMENT Surgical treatment may be suggested if claudication interferes with the patient's activities or work, and if the diseased arteries do not seem to be improving after treatment. Be aware that this condition can worsen over time and you should carefully monitor your condition. HOME CARE INSTRUCTIONS  Talk to your caregiver about the cause of your leg cramping and about what to do at home to relieve it.  A healthy diet is important to lessen the likeliness of atherosclerosis.  A program of daily walking for short periods, and stopping for pain or cramping, may help improve function.  It is important to   stop smoking.  Avoid putting hot or cold items on legs.  Avoid tight shoes. SEEK MEDICAL CARE IF: There are many other causes of leg pain such as arthritis or low blood potassium. However, some causes of leg pain may be life threatening such as a blood clot in the legs. Seek medical attention if you have:  Leg pain that does not go away.  Legs that may be red, hot or swollen.  Ulcers or sores appear on your  ankle or foot.  Any chest pain or shortness of breath accompanying leg pain.  Diabetes.  You are pregnant. SEEK IMMEDIATE MEDICAL CARE IF:   Your leg pain becomes severe or will not go away.  Your foot turns blue or a dark color.  Your leg becomes red, hot or swollen or you develop a fever over 102F.  Any chest pain or shortness of breath accompanying leg pain. MAKE SURE YOU:   Understand these instructions.  Will watch your condition.  Will get help right away if you are not doing well or get worse. Document Released: 09/24/2004 Document Revised: 02/14/2012 Document Reviewed: 02/28/2014 Exit[employer] Patient Information 2015 [employer]. This information is not intended to replace advice given to you by your health care provider. Make sure you discuss any questions you have with your health care provider.  

## 2015-10-27 ENCOUNTER — Emergency Department (HOSPITAL_COMMUNITY)
Admission: EM | Admit: 2015-10-27 | Discharge: 2015-10-27 | Disposition: A | Discharge status: Home / Self Care | Payer: Medicare Other | Attending: Emergency Medicine | Admitting: Emergency Medicine

## 2015-10-27 ENCOUNTER — Emergency Department (HOSPITAL_COMMUNITY): Payer: Medicare Other

## 2015-10-27 ENCOUNTER — Encounter (HOSPITAL_COMMUNITY): Payer: Self-pay

## 2015-10-27 DIAGNOSIS — J449 Chronic obstructive pulmonary disease, unspecified: Secondary | ICD-10-CM | POA: Diagnosis not present

## 2015-10-27 DIAGNOSIS — Z9049 Acquired absence of other specified parts of digestive tract: Secondary | ICD-10-CM | POA: Insufficient documentation

## 2015-10-27 DIAGNOSIS — Z8619 Personal history of other infectious and parasitic diseases: Secondary | ICD-10-CM | POA: Insufficient documentation

## 2015-10-27 DIAGNOSIS — N390 Urinary tract infection, site not specified: Secondary | ICD-10-CM

## 2015-10-27 DIAGNOSIS — R1032 Left lower quadrant pain: Secondary | ICD-10-CM

## 2015-10-27 DIAGNOSIS — E785 Hyperlipidemia, unspecified: Secondary | ICD-10-CM | POA: Insufficient documentation

## 2015-10-27 DIAGNOSIS — Z8719 Personal history of other diseases of the digestive system: Secondary | ICD-10-CM | POA: Diagnosis not present

## 2015-10-27 DIAGNOSIS — Z90710 Acquired absence of both cervix and uterus: Secondary | ICD-10-CM | POA: Insufficient documentation

## 2015-10-27 DIAGNOSIS — Z79899 Other long term (current) drug therapy: Secondary | ICD-10-CM | POA: Diagnosis not present

## 2015-10-27 DIAGNOSIS — M1990 Unspecified osteoarthritis, unspecified site: Secondary | ICD-10-CM | POA: Insufficient documentation

## 2015-10-27 DIAGNOSIS — Z8669 Personal history of other diseases of the nervous system and sense organs: Secondary | ICD-10-CM | POA: Diagnosis not present

## 2015-10-27 DIAGNOSIS — I10 Essential (primary) hypertension: Secondary | ICD-10-CM | POA: Insufficient documentation

## 2015-10-27 DIAGNOSIS — Z7982 Long term (current) use of aspirin: Secondary | ICD-10-CM | POA: Diagnosis not present

## 2015-10-27 LAB — CBC WITH DIFFERENTIAL/PLATELET
Basophils Absolute: 0 10*3/uL (ref 0.0–0.1)
Basophils Relative: 0 %
Eosinophils Absolute: 0.2 10*3/uL (ref 0.0–0.7)
Eosinophils Relative: 2 %
HCT: 39.7 % (ref 36.0–46.0)
Hemoglobin: 12.8 g/dL (ref 12.0–15.0)
Lymphocytes Relative: 29 %
Lymphs Abs: 2.4 10*3/uL (ref 0.7–4.0)
MCH: 32.1 pg (ref 26.0–34.0)
MCHC: 32.2 g/dL (ref 30.0–36.0)
MCV: 99.5 fL (ref 78.0–100.0)
Monocytes Absolute: 0.6 10*3/uL (ref 0.1–1.0)
Monocytes Relative: 7 %
Neutro Abs: 5 10*3/uL (ref 1.7–7.7)
Neutrophils Relative %: 62 %
Platelets: 229 10*3/uL (ref 150–400)
RBC: 3.99 MIL/uL (ref 3.87–5.11)
RDW: 13.2 % (ref 11.5–15.5)
WBC: 8.3 10*3/uL (ref 4.0–10.5)

## 2015-10-27 LAB — URINALYSIS, ROUTINE W REFLEX MICROSCOPIC
Bilirubin Urine: NEGATIVE
Glucose, UA: NEGATIVE mg/dL
Hgb urine dipstick: NEGATIVE
Ketones, ur: NEGATIVE mg/dL
Nitrite: POSITIVE — AB
Protein, ur: NEGATIVE mg/dL
Specific Gravity, Urine: 1.02 (ref 1.005–1.030)
pH: 6 (ref 5.0–8.0)

## 2015-10-27 LAB — COMPREHENSIVE METABOLIC PANEL
ALT: 13 U/L — ABNORMAL LOW (ref 14–54)
AST: 19 U/L (ref 15–41)
Albumin: 4.3 g/dL (ref 3.5–5.0)
Alkaline Phosphatase: 66 U/L (ref 38–126)
Anion gap: 7 (ref 5–15)
BUN: 22 mg/dL — ABNORMAL HIGH (ref 6–20)
CO2: 28 mmol/L (ref 22–32)
Calcium: 9.6 mg/dL (ref 8.9–10.3)
Chloride: 104 mmol/L (ref 101–111)
Creatinine, Ser: 0.84 mg/dL (ref 0.44–1.00)
GFR calc Af Amer: >60 mL/min (ref >60)
GFR calc non Af Amer: >60 mL/min (ref >60)
Glucose, Bld: 99 mg/dL (ref 65–99)
Potassium: 4.1 mmol/L (ref 3.5–5.1)
Sodium: 139 mmol/L (ref 135–145)
Total Bilirubin: 0.7 mg/dL (ref 0.3–1.2)
Total Protein: 6.9 g/dL (ref 6.5–8.1)

## 2015-10-27 LAB — URINE MICROSCOPIC-ADD ON

## 2015-10-27 LAB — LIPASE, BLOOD: Lipase: 39 U/L (ref 11–51)

## 2015-10-27 MED ORDER — METRONIDAZOLE 500 MG PO TABS: 500.0000 mg | ORAL_TABLET | Freq: Three times a day (TID) | ORAL | Status: DC

## 2015-10-27 MED ORDER — MORPHINE SULFATE (PF) 4 MG/ML IV SOLN: 4.0000 mg | INTRAVENOUS | Status: DC | PRN

## 2015-10-27 MED ORDER — ONDANSETRON HCL 4 MG/2ML IJ SOLN: 4.0000 mg | Freq: Three times a day (TID) | INTRAMUSCULAR | Status: DC | PRN

## 2015-10-27 MED ORDER — HYDROCODONE-ACETAMINOPHEN 5-325 MG PO TABS
1.0000 | ORAL_TABLET | Freq: Once | ORAL | Status: AC
  Administered 2015-10-27: 1 via ORAL
  Filled 2015-10-27: qty 1

## 2015-10-27 MED ORDER — CIPROFLOXACIN HCL 500 MG PO TABS: 500.0000 mg | ORAL_TABLET | Freq: Two times a day (BID) | ORAL | Status: DC

## 2015-10-27 MED ORDER — HYDROCODONE-ACETAMINOPHEN 5-325 MG PO TABS: 1.0000 | ORAL_TABLET | ORAL | Status: DC | PRN

## 2015-10-27 MED ORDER — IOHEXOL 300 MG/ML  SOLN
100.0000 mL | Freq: Once | INTRAMUSCULAR | Status: AC | PRN
  Administered 2015-10-27: 100 mL via INTRAVENOUS

## 2015-10-27 NOTE — Discharge Instructions (Signed)
Take your medications as prescribed. Follow-up with your primary care provider in 3-4 days. Return to the emergency department if symptoms worsen or new onset of fever, vomiting, diarrhea, blood in stool.

## 2015-10-27 NOTE — ED Provider Notes (Signed)
CSN: [id number]     Arrival date & time 10/27/15  7829 History   First MD Initiated Contact with Patient 10/27/15 [phone]     Chief Complaint  Patient presents with  . Abdominal Pain  . Back Pain     (Consider location/radiation/quality/duration/timing/severity/associated sxs/prior Treatment) HPI   Patient is a 81 year old female with past medical history of COPD who presents to the ED with complaint of abdominal pain, onset 2 days. Patient reports when she was getting out of bed 2 days ago she began having sharp left lower quadrant abdominal pain radiating to her left lower back. She states the pain as a dull ache or almost completely relieved when laying still but reports having a sharp pain with sitting up or walking. Patient reports taking naproxen without relief. Denies fever, chills, SOB, CP, N/V/D, urinary sxs, constipation, numbness, tingling, weakness. Pt denies saddle anesthesia, loss of bowel or bladder, weakness, IVDU, cancer or recent spinal manipulation. Abdominal surgical history includes cholecystectomy. She notes her last colonoscopy was 4 years ago, unremarkable.  Past Medical History  Diagnosis Date  . Hyperlipidemia   . Hypertension   . Colitis 12/2013    "never had this before"  . Family history of anesthesia complication     "mother; think they gave her too much; c/o pain after OR; gave her more RX; they had to bring her back real quick"  . COPD (chronic obstructive pulmonary disease) (HCC)   . Exertional shortness of breath     "off and on" (12/10/2013)  . Arthritis     "maybe a little in my knees and back" (12/10/2013)  . Hiatal hernia   . Stenosis of tear duct   . Shingles   . H/O calcium pyrophosphate deposition disease (CPPD)   . DOE (dyspnea on exertion)   . PAD (peripheral artery disease) (HCC)   . Allergic rhinitis   . Proteinuria    Past Surgical History  Procedure Laterality Date  . Tonsillectomy and adenoidectomy  ?1940's  . Cholecystectomy  ~ 2009   . Back surgery    . Lumbar disc surgery  ?2004  . Abdominal hysterectomy  1960's  . Cesarean section  5; 1960   Family History  Problem Relation Age of Onset  . Emphysema Father 72  . CVA Mother   . Cancer Brother     ? type   Social History  Substance Use Topics  . Smoking status: Never Smoker   . Smokeless tobacco: Never Used  . Alcohol Use: 2.4 oz/week    4 Glasses of wine per week   OB History    No data available     Review of Systems  Gastrointestinal: Positive for abdominal pain.  Musculoskeletal: Positive for back pain.  All other systems reviewed and are negative.     Allergies  Codeine; Crestor; Darvon; Lipitor; and Zocor  Home Medications   Prior to Admission medications   Medication Sig Start Date End Date Taking? Authorizing Provider  acetaminophen (TYLENOL) 500 MG tablet Take 500 mg by mouth every 6 (six) hours as needed for moderate pain.   Yes Historical Provider, MD  Ascorbic Acid (VITAMIN C PO) Take 1 tablet by mouth daily.   Yes Historical Provider, MD  aspirin EC 81 MG tablet Take 81 mg by mouth every evening.    Yes Historical Provider, MD  budesonide-formoterol (SYMBICORT) 160-4.5 MCG/ACT inhaler Inhale 2 puffs into the lungs 2 (two) times daily.   Yes Historical Provider, MD  Cholecalciferol (VITAMIN  D PO) Take 1 capsule by mouth every evening.   Yes Historical Provider, MD  cilostazol (PLETAL) 50 MG tablet Take 50 mg by mouth 2 (two) times daily.  11/30/14  Yes Historical Provider, MD  diphenhydramine-acetaminophen (TYLENOL PM) 25-500 MG TABS Take 1 tablet by mouth at bedtime as needed (pain/sleep).    Yes Historical Provider, MD  ezetimibe (ZETIA) 10 MG tablet Take 10 mg by mouth every evening.   Yes Historical Provider, MD  lisinopril-hydrochlorothiazide (PRINZIDE,ZESTORETIC) 20-25 MG per tablet Take 1 tablet by mouth every evening.    Yes Historical Provider, MD  metoprolol tartrate (LOPRESSOR) 25 MG tablet Take 0.5 tablets (12.5 mg  total) by mouth 2 (two) times daily. 06/16/15  Yes Lyn RecordsHenry W Smith, MD  naproxen (NAPROSYN) 500 MG tablet Take 500 mg by mouth as needed for moderate pain.    Yes Historical Provider, MD  ciprofloxacin (CIPRO) 500 MG tablet Take 1 tablet (500 mg total) by mouth 2 (two) times daily. 10/27/15   Barrett HenleNicole Elizabeth Torben Soloway, PA-C  HYDROcodone-acetaminophen (NORCO/VICODIN) 5-325 MG tablet Take 1 tablet by mouth every 4 (four) hours as needed. 10/27/15   Barrett HenleNicole Elizabeth Ernesto Zukowski, PA-C  metroNIDAZOLE (FLAGYL) 500 MG tablet Take 1 tablet (500 mg total) by mouth 3 (three) times daily. 10/27/15   Satira SarkNicole Elizabeth Catelynn Sparger, PA-C   BP 182/81 mmHg  Pulse 66  Temp(Src) 98.3 F (36.8 C)  Resp 20  SpO2 99% Physical Exam  Constitutional: She is oriented to person, place, and time. She appears well-developed and well-nourished. No distress.  HENT:  Head: Normocephalic and atraumatic.  Mouth/Throat: Oropharynx is clear and moist. No oropharyngeal exudate.  Eyes: Conjunctivae and EOM are normal. Right eye exhibits no discharge. Left eye exhibits no discharge. No scleral icterus.  Neck: Normal range of motion. Neck supple.  Cardiovascular: Normal rate, regular rhythm, normal heart sounds and intact distal pulses.   Pulmonary/Chest: Effort normal and breath sounds normal. No respiratory distress. She has no wheezes. She has no rales. She exhibits no tenderness.  Abdominal: Soft. Bowel sounds are normal. She exhibits no distension and no mass. There is tenderness (mildly tender at LLQ). There is no rigidity, no rebound, no guarding and no CVA tenderness.  Musculoskeletal: Normal range of motion. She exhibits no edema.       Lumbar back: She exhibits tenderness (left paraspinal muscles). She exhibits normal range of motion, no bony tenderness, no swelling, no edema, no deformity, no laceration, no pain and no spasm.  Lymphadenopathy:    She has no cervical adenopathy.  Neurological: She is alert and oriented to person,  place, and time.  Skin: Skin is warm and dry. She is not diaphoretic.  Nursing note and vitals reviewed.   ED Course  Procedures (including critical care time) Labs Review Labs Reviewed  COMPREHENSIVE METABOLIC PANEL - Abnormal; Notable for the following:    BUN 22 (*)    ALT 13 (*)    All other components within normal limits  URINALYSIS, ROUTINE W REFLEX MICROSCOPIC (NOT [facility]) - Abnormal; Notable for the following:    APPearance CLOUDY (*)    Nitrite POSITIVE (*)    Leukocytes, UA SMALL (*)    All other components within normal limits  URINE MICROSCOPIC-ADD ON - Abnormal; Notable for the following:    Squamous Epithelial / LPF 6-30 (*)    Bacteria, UA MANY (*)    All other components within normal limits  CBC WITH DIFFERENTIAL/PLATELET  LIPASE, BLOOD    Imaging Review  Ct Abdomen Pelvis W Contrast  10/27/2015  CLINICAL DATA:  Left lower quadrant pain for 2 days. History of C difficile colitis. EXAM: CT ABDOMEN AND PELVIS WITH CONTRAST TECHNIQUE: Multidetector CT imaging of the abdomen and pelvis was performed using the standard protocol following bolus administration of intravenous contrast. CONTRAST:  OMNIPAQUE IOHEXOL 300 MG/ML  SOLN COMPARISON:  12/13/2013 FINDINGS: There is mild cardiomegaly.  Lung bases are clear.  No effusions. Prior cholecystectomy. Liver, spleen, pancreas, adrenals are unremarkable. Left kidney is mildly atrophic. 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 Benign appearing cyst off the lower pole the right kidney. No hydronephrosis. There is sigmoid diverticulosis. No active diverticulitis. Stomach and small bowel are decompressed and unremarkable. Prior hysterectomy. No adnexal masses. Urinary bladder is unremarkable. No free fluid, free air or adenopathy. Small umbilical hernia containing fat. No acute bony abnormality or focal bone lesion. Scoliosis and  degenerative changes in the lumbar spine. IMPRESSION: Mild cardiomegaly. Sigmoid diverticulosis.  No active diverticulitis. No acute findings in the abdomen or pelvis. Electronically Signed   By: Charlett Nose M.D.   On: 10/27/2015 12:08   I have personally reviewed and evaluated these images and lab results as part of my medical decision-making.  Filed Vitals:   10/27/15 0930 10/27/15 1213  BP: 179/89 182/81  Pulse: 74 66  Temp: 98.1 F (36.7 C) 98.3 F (36.8 C)  Resp: 19 20     MDM   Final diagnoses:  Left lower quadrant pain  UTI (lower urinary tract infection)    Patient presents with left lower quadrant pain for the past 2 days. Denies fever, vomiting, diarrhea. No relief with naproxen at home. VSS. Exam revealed mild tenderness in left lower quadrant and left lumbar paraspinal muscles. No peritoneal signs. No neuro deficits.  Patient given pain meds in the ED. UA revealed UTI. Labs unremarkable. CT showed sigmoid diverticulosis. Due to patient's pain coinciding with new diverticulosis and UTI will plan to discharge patient home with antibiotics and pain meds. Advised patient to follow up with her primary care provider this week.  Evaluation does not show pathology requring ongoing emergent intervention or admission. Pt is hemodynamically stable and mentating appropriately. Discussed findings/results and plan with patient/guardian, who agrees with plan. All questions answered. Return precautions discussed and outpatient follow up given.      Satira Sark [place], [place] 10/27/15 1325  Azalia Bilis, MD 10/28/15 [phone]

## 2015-10-27 NOTE — ED Notes (Signed)
Patient denies needing pain or nausea med at this time.

## 2015-10-27 NOTE — ED Notes (Signed)
Pt made aware that urine sample is needed.

## 2015-10-27 NOTE — ED Notes (Signed)
Patient transported to CT 

## 2015-10-27 NOTE — ED Notes (Signed)
Pt aware of urine sample info me she still does not have to use restroom. Will info staff

## 2015-10-27 NOTE — ED Notes (Signed)
Pt c/o LLQ pain radiating into L lower back x 2 days.  Pain score 10/10 w/ ambulation.  Pt reports pain started when she got out of bed x 2 days ago.  Denies GU complaints.  Denies n/v/d.

## 2015-10-28 ENCOUNTER — Emergency Department (HOSPITAL_COMMUNITY)
Admission: EM | Admit: 2015-10-28 | Discharge: 2015-10-28 | Disposition: A | Discharge status: Home / Self Care | Payer: Medicare Other | Attending: Physician Assistant | Admitting: Physician Assistant

## 2015-10-28 ENCOUNTER — Encounter (HOSPITAL_COMMUNITY): Payer: Self-pay

## 2015-10-28 DIAGNOSIS — R39198 Other difficulties with micturition: Secondary | ICD-10-CM | POA: Diagnosis not present

## 2015-10-28 DIAGNOSIS — M545 Low back pain, unspecified: Secondary | ICD-10-CM

## 2015-10-28 DIAGNOSIS — M549 Dorsalgia, unspecified: Secondary | ICD-10-CM | POA: Diagnosis present

## 2015-10-28 DIAGNOSIS — Z7982 Long term (current) use of aspirin: Secondary | ICD-10-CM | POA: Diagnosis not present

## 2015-10-28 DIAGNOSIS — Z79899 Other long term (current) drug therapy: Secondary | ICD-10-CM | POA: Insufficient documentation

## 2015-10-28 DIAGNOSIS — M1990 Unspecified osteoarthritis, unspecified site: Secondary | ICD-10-CM | POA: Insufficient documentation

## 2015-10-28 DIAGNOSIS — Z8719 Personal history of other diseases of the digestive system: Secondary | ICD-10-CM | POA: Diagnosis not present

## 2015-10-28 DIAGNOSIS — R200 Anesthesia of skin: Secondary | ICD-10-CM | POA: Diagnosis not present

## 2015-10-28 DIAGNOSIS — R55 Syncope and collapse: Secondary | ICD-10-CM | POA: Diagnosis not present

## 2015-10-28 DIAGNOSIS — Z8619 Personal history of other infectious and parasitic diseases: Secondary | ICD-10-CM | POA: Insufficient documentation

## 2015-10-28 DIAGNOSIS — Z8669 Personal history of other diseases of the nervous system and sense organs: Secondary | ICD-10-CM | POA: Diagnosis not present

## 2015-10-28 DIAGNOSIS — E785 Hyperlipidemia, unspecified: Secondary | ICD-10-CM | POA: Diagnosis not present

## 2015-10-28 DIAGNOSIS — Z7951 Long term (current) use of inhaled steroids: Secondary | ICD-10-CM | POA: Insufficient documentation

## 2015-10-28 DIAGNOSIS — R110 Nausea: Secondary | ICD-10-CM | POA: Diagnosis not present

## 2015-10-28 DIAGNOSIS — I10 Essential (primary) hypertension: Secondary | ICD-10-CM | POA: Insufficient documentation

## 2015-10-28 DIAGNOSIS — J449 Chronic obstructive pulmonary disease, unspecified: Secondary | ICD-10-CM | POA: Diagnosis not present

## 2015-10-28 LAB — CBC WITH DIFFERENTIAL/PLATELET
Basophils Absolute: 0 10*3/uL (ref 0.0–0.1)
Basophils Relative: 0 %
Eosinophils Absolute: 0.2 10*3/uL (ref 0.0–0.7)
Eosinophils Relative: 2 %
HCT: 39.4 % (ref 36.0–46.0)
Hemoglobin: 12.9 g/dL (ref 12.0–15.0)
Lymphocytes Relative: 21 %
Lymphs Abs: 2.1 10*3/uL (ref 0.7–4.0)
MCH: 32.1 pg (ref 26.0–34.0)
MCHC: 32.7 g/dL (ref 30.0–36.0)
MCV: 98 fL (ref 78.0–100.0)
Monocytes Absolute: 0.5 10*3/uL (ref 0.1–1.0)
Monocytes Relative: 6 %
Neutro Abs: 6.8 10*3/uL (ref 1.7–7.7)
Neutrophils Relative %: 71 %
Platelets: 231 10*3/uL (ref 150–400)
RBC: 4.02 MIL/uL (ref 3.87–5.11)
RDW: 13.1 % (ref 11.5–15.5)
WBC: 9.6 10*3/uL (ref 4.0–10.5)

## 2015-10-28 LAB — COMPREHENSIVE METABOLIC PANEL
ALT: 13 U/L — ABNORMAL LOW (ref 14–54)
AST: 15 U/L (ref 15–41)
Albumin: 4.1 g/dL (ref 3.5–5.0)
Alkaline Phosphatase: 66 U/L (ref 38–126)
Anion gap: 10 (ref 5–15)
BUN: 27 mg/dL — ABNORMAL HIGH (ref 6–20)
CO2: 27 mmol/L (ref 22–32)
Calcium: 9.7 mg/dL (ref 8.9–10.3)
Chloride: 103 mmol/L (ref 101–111)
Creatinine, Ser: 0.97 mg/dL (ref 0.44–1.00)
GFR calc Af Amer: >60 mL/min (ref >60)
GFR calc non Af Amer: 53 mL/min — ABNORMAL LOW (ref >60)
Glucose, Bld: 105 mg/dL — ABNORMAL HIGH (ref 65–99)
Potassium: 4.5 mmol/L (ref 3.5–5.1)
Sodium: 140 mmol/L (ref 135–145)
Total Bilirubin: 0.9 mg/dL (ref 0.3–1.2)
Total Protein: 6.7 g/dL (ref 6.5–8.1)

## 2015-10-28 MED ORDER — DIAZEPAM 2 MG PO TABS
2.0000 mg | ORAL_TABLET | Freq: Once | ORAL | Status: AC
  Administered 2015-10-28: 2 mg via ORAL
  Filled 2015-10-28: qty 1

## 2015-10-28 MED ORDER — DIAZEPAM 5 MG PO TABS: 2.5000 mg | ORAL_TABLET | Freq: Four times a day (QID) | ORAL | Status: DC | PRN

## 2015-10-28 MED ORDER — IBUPROFEN 800 MG PO TABS: 800.0000 mg | ORAL_TABLET | Freq: Three times a day (TID) | ORAL | Status: DC

## 2015-10-28 MED ORDER — IBUPROFEN 800 MG PO TABS
800.0000 mg | ORAL_TABLET | Freq: Once | ORAL | Status: AC
  Administered 2015-10-28: 800 mg via ORAL
  Filled 2015-10-28: qty 1

## 2015-10-28 MED ORDER — ONDANSETRON HCL 4 MG PO TABS: 4.0000 mg | ORAL_TABLET | Freq: Three times a day (TID) | ORAL | Status: DC | PRN

## 2015-10-28 MED ORDER — ONDANSETRON HCL 4 MG PO TABS
4.0000 mg | ORAL_TABLET | Freq: Once | ORAL | Status: AC
  Administered 2015-10-28: 4 mg via ORAL
  Filled 2015-10-28: qty 1

## 2015-10-28 NOTE — ED Notes (Signed)
Patient ambulated to the bathroom without difficulty or assistance. Patient states she feels much better.

## 2015-10-28 NOTE — ED Notes (Signed)
Per EMS- Patient was in the ED yesterday for similar complaints of back pain, but was diagnosed with diverticulosis. Patient started vomiting after taking po pain meds at home. Patient continues to c/o back pain.

## 2015-10-28 NOTE — Discharge Instructions (Signed)
You were seen today for back pain. We think this has to do with the muscles in your back. Please take Valium and ibuprofen as needed. Please return immediately if you have any trouble urinating, fevers, weakness in your legs.   Back Pain, Adult Back pain is very common in adults.The cause of back pain is rarely dangerous and the pain often gets better over time.The cause of your back pain may not be known. Some common causes of back pain include: 1. Strain of the muscles or ligaments supporting the spine. 2. Wear and tear (degeneration) of the spinal disks. 3. Arthritis. 4. Direct injury to the back. For many people, back pain may return. Since back pain is rarely dangerous, most people can learn to manage this condition on their own. HOME CARE INSTRUCTIONS Watch your back pain for any changes. The following actions may help to lessen any discomfort you are feeling: 1. Remain active. It is stressful on your back to sit or stand in one place for long periods of time. Do not sit, drive, or stand in one place for more than 30 minutes at a time. Take short walks on even surfaces as soon as you are able.Try to increase the length of time you walk each day. 2. Exercise regularly as directed by your health care provider. Exercise helps your back heal faster. It also helps avoid future injury by keeping your muscles strong and flexible. 3. Do not stay in bed.Resting more than 1-2 days can delay your recovery. 4. Pay attention to your body when you bend and lift. The most comfortable positions are those that put less stress on your recovering back. Always use proper lifting techniques, including: 1. Bending your knees. 2. Keeping the load close to your body. 3. Avoiding twisting. 5. Find a comfortable position to sleep. Use a firm mattress and lie on your side with your knees slightly bent. If you lie on your back, put a pillow under your knees. 6. Avoid feeling anxious or stressed.Stress increases  muscle tension and can worsen back pain.It is important to recognize when you are anxious or stressed and learn ways to manage it, such as with exercise. 7. Take medicines only as directed by your health care provider. Over-the-counter medicines to reduce pain and inflammation are often the most helpful.Your health care provider may prescribe muscle relaxant drugs.These medicines help dull your pain so you can more quickly return to your normal activities and healthy exercise. 8. Apply ice to the injured area: 1. Put ice in a plastic bag. 2. Place a towel between your skin and the bag. 3. Leave the ice on for 20 minutes, 2-3 times a day for the first 2-3 days. After that, ice and heat may be alternated to reduce pain and spasms. 9. Maintain a healthy weight. Excess weight puts extra stress on your back and makes it difficult to maintain good posture. SEEK MEDICAL CARE IF: 1. You have pain that is not relieved with rest or medicine. 2. You have increasing pain going down into the legs or buttocks. 3. You have pain that does not improve in one week. 4. You have night pain. 5. You lose weight. 6. You have a fever or chills. SEEK IMMEDIATE MEDICAL CARE IF:  1. You develop new bowel or bladder control problems. 2. You have unusual weakness or numbness in your arms or legs. 3. You develop nausea or vomiting. 4. You develop abdominal pain. 5. You feel faint.   This information is not  intended to replace advice given to you by your health care provider. Make sure you discuss any questions you have with your health care provider.   Document Released: 11/22/2005 Document Revised: 12/13/2014 Document Reviewed: 03/26/2014 Elsevier Interactive Patient Education 2016 [employer]  Back Exercises If you have pain in your back, do these exercises 2-3 times each day or as told by your doctor. When the pain goes away, do the exercises once each day, but repeat the steps more times for each exercise (do  more repetitions). If you do not have pain in your back, do these exercises once each day or as told by your doctor. EXERCISES Single Knee to Chest Do these steps 3-5 times in a row for each leg: 5. Lie on your back on a firm bed or the floor with your legs stretched out. 6. Bring one knee to your chest. 7. Hold your knee to your chest by grabbing your knee or thigh. 8. Pull on your knee until you feel a gentle stretch in your lower back. 9. Keep doing the stretch for 10-30 seconds. 10. Slowly let go of your leg and straighten it. Pelvic Tilt Do these steps 5-10 times in a row: 10. Lie on your back on a firm bed or the floor with your legs stretched out. 11. Bend your knees so they point up to the ceiling. Your feet should be flat on the floor. 12. Tighten your lower belly (abdomen) muscles to press your lower back against the floor. This will make your tailbone point up to the ceiling instead of pointing down to your feet or the floor. 13. Stay in this position for 5-10 seconds while you gently tighten your muscles and breathe evenly. Cat-Cow Do these steps until your lower back bends more easily: 7. Get on your hands and knees on a firm surface. Keep your hands under your shoulders, and keep your knees under your hips. You may put padding under your knees. 8. Let your head hang down, and make your tailbone point down to the floor so your lower back is round like the back of a cat. 9. Stay in this position for 5 seconds. 10. Slowly lift your head and make your tailbone point up to the ceiling so your back hangs low (sags) like the back of a cow. 11. Stay in this position for 5 seconds. Press-Ups Do these steps 5-10 times in a row: 6. Lie on your belly (face-down) on the floor. 7. Place your hands near your head, about shoulder-width apart. 8. While you keep your back relaxed and keep your hips on the floor, slowly straighten your arms to raise the top half of your body and lift your  shoulders. Do not use your back muscles. To make yourself more comfortable, you may change where you place your hands. 9. Stay in this position for 5 seconds. 10. Slowly return to lying flat on the floor. Bridges Do these steps 10 times in a row: 1. Lie on your back on a firm surface. 2. Bend your knees so they point up to the ceiling. Your feet should be flat on the floor. 3. Tighten your butt muscles and lift your butt off of the floor until your waist is almost as high as your knees. If you do not feel the muscles working in your butt and the back of your thighs, slide your feet 1-2 inches farther away from your butt. 4. Stay in this position for 3-5 seconds. 5. Slowly lower your  butt to the floor, and let your butt muscles relax. If this exercise is too easy, try doing it with your arms crossed over your chest. Belly Crunches Do these steps 5-10 times in a row: 1. Lie on your back on a firm bed or the floor with your legs stretched out. 2. Bend your knees so they point up to the ceiling. Your feet should be flat on the floor. 3. Cross your arms over your chest. 4. Tip your chin a little bit toward your chest but do not bend your neck. 5. Tighten your belly muscles and slowly raise your chest just enough to lift your shoulder blades a tiny bit off of the floor. 6. Slowly lower your chest and your head to the floor. Back Lifts Do these steps 5-10 times in a row: 1. Lie on your belly (face-down) with your arms at your sides, and rest your forehead on the floor. 2. Tighten the muscles in your legs and your butt. 3. Slowly lift your chest off of the floor while you keep your hips on the floor. Keep the back of your head in line with the curve in your back. Look at the floor while you do this. 4. Stay in this position for 3-5 seconds. 5. Slowly lower your chest and your face to the floor. GET HELP IF:  Your back pain gets a lot worse when you do an exercise.  Your back pain does not  lessen 2 hours after you exercise. If you have any of these problems, stop doing the exercises. Do not do them again unless your doctor says it is okay. GET HELP RIGHT AWAY IF:  You have sudden, very bad back pain. If this happens, stop doing the exercises. Do not do them again unless your doctor says it is okay.   This information is not intended to replace advice given to you by your health care provider. Make sure you discuss any questions you have with your health care provider.   Document Released: 12/25/2010 Document Revised: 08/13/2015 Document Reviewed: 01/16/2015 Elsevier Interactive Patient Education [employer]

## 2015-10-28 NOTE — ED Notes (Signed)
Bed: WA02 Expected date:  Expected time:  Means of arrival:  Comments: Ems- back pain/ elderly

## 2015-10-28 NOTE — ED Provider Notes (Addendum)
CSN: [id number]     Arrival date & time 10/28/15  4098 History   First MD Initiated Contact with Patient 10/28/15 0744     Chief Complaint  Patient presents with  . Back Pain     (Consider location/radiation/quality/duration/timing/severity/associated sxs/prior Treatment) HPI  Patient is a 81 year old female with past medical history of COPD presenting to the ED for the second time in 2 days. Yesterday she was seen with abdominal pain made her back. Patient can't have a UTI and diverticulosis on CAT scan. Patient started on antibiotics.  Patient has been unable tolerate the pain at home. She says the pain is mostly located in the back now there is no abdominal component. She reports that she just has trouble getting up and out of bed moving due to the pain. She's got no numbness no tingling no weakness. She's had no recent falls or spinal manipulation. She had no loss of bowel or bladder.  Patient went home yesterday but continued to have back pain.    Past Medical History  Diagnosis Date  . Hyperlipidemia   . Hypertension   . Colitis 12/2013    "never had this before"  . Family history of anesthesia complication     "mother; think they gave her too much; c/o pain after OR; gave her more RX; they had to bring her back real quick"  . COPD (chronic obstructive pulmonary disease) (HCC)   . Exertional shortness of breath     "off and on" (12/10/2013)  . Arthritis     "maybe a little in my knees and back" (12/10/2013)  . Hiatal hernia   . Stenosis of tear duct   . Shingles   . H/O calcium pyrophosphate deposition disease (CPPD)   . DOE (dyspnea on exertion)   . PAD (peripheral artery disease) (HCC)   . Allergic rhinitis   . Proteinuria    Past Surgical History  Procedure Laterality Date  . Tonsillectomy and adenoidectomy  ?1940's  . Cholecystectomy  ~ 2009  . Back surgery    . Lumbar disc surgery  ?2004  . Abdominal hysterectomy  1960's  . Cesarean section  34; 1960    Family History  Problem Relation Age of Onset  . Emphysema Father 55  . CVA Mother   . Cancer Brother     ? type   Social History  Substance Use Topics  . Smoking status: Never Smoker   . Smokeless tobacco: Never Used  . Alcohol Use: 2.4 oz/week    4 Glasses of wine per week   OB History    No data available     Review of Systems  Constitutional: Negative for fever, chills, activity change and fatigue.  HENT: Negative for ear pain.   Respiratory: Negative for shortness of breath.   Cardiovascular: Negative for chest pain.  Gastrointestinal: Positive for nausea. Negative for abdominal pain and diarrhea.  Genitourinary: Positive for difficulty urinating.  Musculoskeletal: Positive for back pain. Negative for arthralgias.  Neurological: Positive for syncope and numbness.  All other systems reviewed and are negative.     Allergies  Codeine; Crestor; Darvon; Lipitor; and Zocor  Home Medications   Prior to Admission medications   Medication Sig Start Date End Date Taking? Authorizing Provider  acetaminophen (TYLENOL) 500 MG tablet Take 500 mg by mouth every 6 (six) hours as needed for moderate pain.   Yes Historical Provider, MD  Ascorbic Acid (VITAMIN C PO) Take 1 tablet by mouth daily.   Yes  Historical Provider, MD  aspirin EC 81 MG tablet Take 81 mg by mouth every evening.    Yes Historical Provider, MD  budesonide-formoterol (SYMBICORT) 160-4.5 MCG/ACT inhaler Inhale 2 puffs into the lungs 2 (two) times daily.   Yes Historical Provider, MD  Cholecalciferol (VITAMIN D PO) Take 1 capsule by mouth every evening.   Yes Historical Provider, MD  cilostazol (PLETAL) 50 MG tablet Take 50 mg by mouth 2 (two) times daily.  11/30/14  Yes Historical Provider, MD  ciprofloxacin (CIPRO) 500 MG tablet Take 1 tablet (500 mg total) by mouth 2 (two) times daily. Patient taking differently: Take 500 mg by mouth 2 (two) times daily. For 10 days 11-21 to 12-1 10/27/15  Yes Barrett Henle, PA-C  diphenhydramine-acetaminophen (TYLENOL PM) 25-500 MG TABS Take 1 tablet by mouth at bedtime as needed (pain/sleep).    Yes Historical Provider, MD  ezetimibe (ZETIA) 10 MG tablet Take 10 mg by mouth every evening.   Yes Historical Provider, MD  lisinopril-hydrochlorothiazide (PRINZIDE,ZESTORETIC) 20-25 MG per tablet Take 1 tablet by mouth every evening.    Yes Historical Provider, MD  metoprolol tartrate (LOPRESSOR) 25 MG tablet Take 0.5 tablets (12.5 mg total) by mouth 2 (two) times daily. 06/16/15  Yes Lyn Records, MD  metroNIDAZOLE (FLAGYL) 500 MG tablet Take 1 tablet (500 mg total) by mouth 3 (three) times daily. Patient taking differently: Take 500 mg by mouth 3 (three) times daily. For 10 days 11-21 to 12-1 10/27/15  Yes Barrett Henle, PA-C  naproxen (NAPROSYN) 500 MG tablet Take 500 mg by mouth as needed for moderate pain.    Yes Historical Provider, MD  HYDROcodone-acetaminophen (NORCO/VICODIN) 5-325 MG tablet Take 1 tablet by mouth every 4 (four) hours as needed. Patient not taking: Reported on 10/28/2015 10/27/15   Satira Sark Nadeau, PA-C   BP 172/80 mmHg  Pulse 77  Temp(Src) 98.3 F (36.8 C) (Oral)  Resp 16  SpO2 97% Physical Exam  Constitutional: She is oriented to person, place, and time. She appears well-developed and well-nourished.  HENT:  Head: Normocephalic and atraumatic.  Eyes: Conjunctivae are normal. Right eye exhibits no discharge.  Neck: Neck supple.  Cardiovascular: Normal rate, regular rhythm and normal heart sounds.   No murmur heard. Pulmonary/Chest: Effort normal and breath sounds normal. She has no wheezes. She has no rales.  Abdominal: Soft. She exhibits no distension. There is no tenderness.  Musculoskeletal: Normal range of motion. She exhibits no edema.  Straight leg test positive on the left.  Neurological: She is oriented to person, place, and time. No cranial nerve deficit.  Skin: Skin is warm and dry. No rash noted.  She is not diaphoretic.  Psychiatric: She has a normal mood and affect. Her behavior is normal.  Nursing note and vitals reviewed.   ED Course  Procedures (including critical care time) Labs Review Labs Reviewed  COMPREHENSIVE METABOLIC PANEL  CBC WITH DIFFERENTIAL/PLATELET    Imaging Review Ct Abdomen Pelvis W Contrast  10/27/2015  CLINICAL DATA:  Left lower quadrant pain for 2 days. History of C difficile colitis. EXAM: CT ABDOMEN AND PELVIS WITH CONTRAST TECHNIQUE: Multidetector CT imaging of the abdomen and pelvis was performed using the standard protocol following bolus administration of intravenous contrast. CONTRAST:  OMNIPAQUE IOHEXOL 300 MG/ML  SOLN COMPARISON:  12/13/2013 FINDINGS: There is mild cardiomegaly.  Lung bases are clear.  No effusions. Prior cholecystectomy. Liver, spleen, pancreas, adrenals are unremarkable. Left kidney is mildly atrophic. 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 Benign appearing cyst off the lower pole the right kidney. No hydronephrosis. There is sigmoid diverticulosis. No active diverticulitis. Stomach and small bowel are decompressed and unremarkable. Prior hysterectomy. No adnexal masses. Urinary bladder is unremarkable. No free fluid, free air or adenopathy. Small umbilical hernia containing fat. No acute bony abnormality or focal bone lesion. Scoliosis and degenerative changes in the lumbar spine. IMPRESSION: Mild cardiomegaly. Sigmoid diverticulosis.  No active diverticulitis. No acute findings in the abdomen or pelvis. Electronically Signed   By: Charlett NoseKevin  Dover M.D.   On: 10/27/2015 12:08   I have personally reviewed and evaluated these images and lab results as part of my medical decision-making.   EKG Interpretation None      MDM   Final diagnoses:  None   patient is an incredibly sweet 81 year old female presenting today with back pain.  Patient seen yesterday for similar complaint. Patient had normal CT and labs at that time. Patient discharged on medications for urinary tract infection. Patient has not had any fever. She had had some nausea overnight but mostly complains of back pain.  Patient did not take any pain medications given to her because she did not like the way that they make her feel. She did take her antibiotics. She tolerated them well. Patient thinks that she may benefit from a muscle relaxant. Patient has difficulty with her range of motion of her back but with no bony tenderness. Patient has normal strength bilateral lower extremities as well as sensation. Patient has no red flags that would require advanced imaging such as MRI. She had normal CAT scan I reviewed the bony window did not see any metastases or lesions that could be exacerbating her pain. Did not see any evidence of aortic dissection either.   We will try a different approach to symptomatically care today.   9:34 AM Patient feels much better after Valium and ibuprofen. She is able to get up and walk to the bathroom. She states she feels "like a new woman". Will give patient Valium and Diprivan for the next couple days. We warned her not to take ibuprofen for longer than that. We also warned her to be careful when taking Valium because it can make people of her age dizzy and unsteady. Both she and husband agree to be careful with the medication.  Arrie Zuercher Randall AnLyn Anel Purohit, MD 10/28/15 [id number]  Duffy Dantonio Randall AnLyn Tatyanna Cronk, MD 10/28/15 1028

## 2015-10-29 ENCOUNTER — Encounter (HOSPITAL_COMMUNITY): Payer: Self-pay | Admitting: Emergency Medicine

## 2015-10-29 ENCOUNTER — Observation Stay (HOSPITAL_COMMUNITY): Payer: Medicare Other

## 2015-10-29 ENCOUNTER — Observation Stay (HOSPITAL_COMMUNITY)
Admission: EM | Admit: 2015-10-29 | Discharge: 2015-10-31 | Disposition: A | Discharge status: Home / Self Care | Payer: Medicare Other | Attending: Family Medicine | Admitting: Family Medicine

## 2015-10-29 DIAGNOSIS — I739 Peripheral vascular disease, unspecified: Secondary | ICD-10-CM | POA: Diagnosis not present

## 2015-10-29 DIAGNOSIS — R197 Diarrhea, unspecified: Secondary | ICD-10-CM | POA: Diagnosis not present

## 2015-10-29 DIAGNOSIS — N390 Urinary tract infection, site not specified: Secondary | ICD-10-CM | POA: Diagnosis not present

## 2015-10-29 DIAGNOSIS — M1990 Unspecified osteoarthritis, unspecified site: Secondary | ICD-10-CM | POA: Diagnosis not present

## 2015-10-29 DIAGNOSIS — I10 Essential (primary) hypertension: Secondary | ICD-10-CM | POA: Insufficient documentation

## 2015-10-29 DIAGNOSIS — R05 Cough: Secondary | ICD-10-CM | POA: Diagnosis not present

## 2015-10-29 DIAGNOSIS — N1831 Chronic kidney disease, stage 3a: Secondary | ICD-10-CM | POA: Diagnosis present

## 2015-10-29 DIAGNOSIS — Z791 Long term (current) use of non-steroidal anti-inflammatories (NSAID): Secondary | ICD-10-CM | POA: Insufficient documentation

## 2015-10-29 DIAGNOSIS — E785 Hyperlipidemia, unspecified: Secondary | ICD-10-CM | POA: Insufficient documentation

## 2015-10-29 DIAGNOSIS — J449 Chronic obstructive pulmonary disease, unspecified: Secondary | ICD-10-CM | POA: Diagnosis not present

## 2015-10-29 DIAGNOSIS — K449 Diaphragmatic hernia without obstruction or gangrene: Secondary | ICD-10-CM | POA: Insufficient documentation

## 2015-10-29 DIAGNOSIS — Z7982 Long term (current) use of aspirin: Secondary | ICD-10-CM | POA: Diagnosis not present

## 2015-10-29 DIAGNOSIS — Z7951 Long term (current) use of inhaled steroids: Secondary | ICD-10-CM | POA: Diagnosis not present

## 2015-10-29 DIAGNOSIS — R001 Bradycardia, unspecified: Secondary | ICD-10-CM | POA: Diagnosis not present

## 2015-10-29 DIAGNOSIS — Z79899 Other long term (current) drug therapy: Secondary | ICD-10-CM | POA: Diagnosis not present

## 2015-10-29 DIAGNOSIS — E860 Dehydration: Secondary | ICD-10-CM | POA: Insufficient documentation

## 2015-10-29 DIAGNOSIS — N179 Acute kidney failure, unspecified: Principal | ICD-10-CM | POA: Insufficient documentation

## 2015-10-29 DIAGNOSIS — R112 Nausea with vomiting, unspecified: Secondary | ICD-10-CM | POA: Insufficient documentation

## 2015-10-29 DIAGNOSIS — R059 Cough, unspecified: Secondary | ICD-10-CM

## 2015-10-29 LAB — URINALYSIS, ROUTINE W REFLEX MICROSCOPIC
Bilirubin Urine: NEGATIVE
Glucose, UA: NEGATIVE mg/dL
Hgb urine dipstick: NEGATIVE
Ketones, ur: NEGATIVE mg/dL
Nitrite: NEGATIVE
Protein, ur: 30 mg/dL — AB
Specific Gravity, Urine: 1.012 (ref 1.005–1.030)
pH: 6 (ref 5.0–8.0)

## 2015-10-29 LAB — BASIC METABOLIC PANEL
Anion gap: 11 (ref 5–15)
BUN: 33 mg/dL — ABNORMAL HIGH (ref 6–20)
CO2: 23 mmol/L (ref 22–32)
Calcium: 10.1 mg/dL (ref 8.9–10.3)
Chloride: 106 mmol/L (ref 101–111)
Creatinine, Ser: 1.63 mg/dL — ABNORMAL HIGH (ref 0.44–1.00)
GFR calc Af Amer: 33 mL/min — ABNORMAL LOW (ref >60)
GFR calc non Af Amer: 28 mL/min — ABNORMAL LOW (ref >60)
Glucose, Bld: 99 mg/dL (ref 65–99)
Potassium: 4.1 mmol/L (ref 3.5–5.1)
Sodium: 140 mmol/L (ref 135–145)

## 2015-10-29 LAB — CBC WITH DIFFERENTIAL/PLATELET
Basophils Absolute: 0 10*3/uL (ref 0.0–0.1)
Basophils Relative: 0 %
Eosinophils Absolute: 0.3 10*3/uL (ref 0.0–0.7)
Eosinophils Relative: 2 %
HCT: 39.1 % (ref 36.0–46.0)
Hemoglobin: 12.8 g/dL (ref 12.0–15.0)
Lymphocytes Relative: 22 %
Lymphs Abs: 2.5 10*3/uL (ref 0.7–4.0)
MCH: 32.3 pg (ref 26.0–34.0)
MCHC: 32.7 g/dL (ref 30.0–36.0)
MCV: 98.7 fL (ref 78.0–100.0)
Monocytes Absolute: 0.8 10*3/uL (ref 0.1–1.0)
Monocytes Relative: 7 %
Neutro Abs: 7.9 10*3/uL — ABNORMAL HIGH (ref 1.7–7.7)
Neutrophils Relative %: 69 %
Platelets: 234 10*3/uL (ref 150–400)
RBC: 3.96 MIL/uL (ref 3.87–5.11)
RDW: 13.4 % (ref 11.5–15.5)
WBC: 11.6 10*3/uL — ABNORMAL HIGH (ref 4.0–10.5)

## 2015-10-29 LAB — URINE MICROSCOPIC-ADD ON

## 2015-10-29 MED ORDER — HYDROCODONE-ACETAMINOPHEN 5-325 MG PO TABS
1.0000 | ORAL_TABLET | ORAL | Status: DC | PRN
  Filled 2015-10-29: qty 2

## 2015-10-29 MED ORDER — DIAZEPAM 5 MG PO TABS
2.5000 mg | ORAL_TABLET | Freq: Four times a day (QID) | ORAL | Status: DC | PRN
  Administered 2015-10-30 – 2015-10-31 (×3): 2.5 mg via ORAL
  Filled 2015-10-29 (×3): qty 1

## 2015-10-29 MED ORDER — BUDESONIDE-FORMOTEROL FUMARATE 160-4.5 MCG/ACT IN AERO
2.0000 | INHALATION_SPRAY | Freq: Two times a day (BID) | RESPIRATORY_TRACT | Status: DC
  Administered 2015-10-29 – 2015-10-31 (×4): 2 via RESPIRATORY_TRACT
  Filled 2015-10-29: qty 6

## 2015-10-29 MED ORDER — ONDANSETRON HCL 4 MG/2ML IJ SOLN
4.0000 mg | Freq: Four times a day (QID) | INTRAMUSCULAR | Status: DC | PRN
  Administered 2015-10-29: 4 mg via INTRAVENOUS
  Filled 2015-10-29: qty 2

## 2015-10-29 MED ORDER — ASPIRIN EC 81 MG PO TBEC
81.0000 mg | DELAYED_RELEASE_TABLET | Freq: Every evening | ORAL | Status: DC
  Administered 2015-10-29 – 2015-10-30 (×2): 81 mg via ORAL
  Filled 2015-10-29 (×3): qty 1

## 2015-10-29 MED ORDER — ENOXAPARIN SODIUM 30 MG/0.3ML ~~LOC~~ SOLN
30.0000 mg | Freq: Every day | SUBCUTANEOUS | Status: DC
  Administered 2015-10-30 (×2): 30 mg via SUBCUTANEOUS
  Filled 2015-10-29 (×3): qty 0.3

## 2015-10-29 MED ORDER — SODIUM CHLORIDE 0.9 % IV SOLN: INTRAVENOUS | Status: DC

## 2015-10-29 MED ORDER — ALBUTEROL SULFATE (2.5 MG/3ML) 0.083% IN NEBU: 2.5000 mg | INHALATION_SOLUTION | RESPIRATORY_TRACT | Status: DC | PRN

## 2015-10-29 MED ORDER — ONDANSETRON HCL 4 MG/2ML IJ SOLN: 4.0000 mg | Freq: Three times a day (TID) | INTRAMUSCULAR | Status: DC | PRN

## 2015-10-29 MED ORDER — LOPERAMIDE HCL 2 MG PO CAPS
4.0000 mg | ORAL_CAPSULE | Freq: Once | ORAL | Status: AC
  Administered 2015-10-29: 4 mg via ORAL
  Filled 2015-10-29: qty 2

## 2015-10-29 MED ORDER — ACETAMINOPHEN 650 MG RE SUPP: 650.0000 mg | Freq: Four times a day (QID) | RECTAL | Status: DC | PRN

## 2015-10-29 MED ORDER — CILOSTAZOL 50 MG PO TABS
50.0000 mg | ORAL_TABLET | Freq: Two times a day (BID) | ORAL | Status: DC
  Administered 2015-10-29 – 2015-10-31 (×4): 50 mg via ORAL
  Filled 2015-10-29 (×6): qty 1

## 2015-10-29 MED ORDER — ONDANSETRON HCL 4 MG PO TABS: 4.0000 mg | ORAL_TABLET | Freq: Four times a day (QID) | ORAL | Status: DC | PRN

## 2015-10-29 MED ORDER — EZETIMIBE 10 MG PO TABS
10.0000 mg | ORAL_TABLET | Freq: Every evening | ORAL | Status: DC
  Administered 2015-10-29 – 2015-10-30 (×2): 10 mg via ORAL
  Filled 2015-10-29 (×3): qty 1

## 2015-10-29 MED ORDER — ACETAMINOPHEN 325 MG PO TABS: 650.0000 mg | ORAL_TABLET | Freq: Four times a day (QID) | ORAL | Status: DC | PRN

## 2015-10-29 MED ORDER — SODIUM CHLORIDE 0.9 % IV SOLN
1000.0000 mL | Freq: Once | INTRAVENOUS | Status: AC
  Administered 2015-10-29: 1000 mL via INTRAVENOUS

## 2015-10-29 MED ORDER — SODIUM CHLORIDE 0.9 % IJ SOLN
3.0000 mL | Freq: Two times a day (BID) | INTRAMUSCULAR | Status: DC
  Administered 2015-10-30: 3 mL via INTRAVENOUS

## 2015-10-29 MED ORDER — DIPHENHYDRAMINE HCL 25 MG PO CAPS
25.0000 mg | ORAL_CAPSULE | Freq: Every evening | ORAL | Status: DC | PRN
  Administered 2015-10-30 (×2): 25 mg via ORAL
  Filled 2015-10-29 (×2): qty 1

## 2015-10-29 MED ORDER — SODIUM CHLORIDE 0.9 % IV SOLN
1000.0000 mL | INTRAVENOUS | Status: DC
  Administered 2015-10-29: 1000 mL via INTRAVENOUS

## 2015-10-29 MED ORDER — SODIUM CHLORIDE 0.9 % IV SOLN
INTRAVENOUS | Status: AC
  Administered 2015-10-29: 75 mL via INTRAVENOUS

## 2015-10-29 NOTE — ED Notes (Signed)
Pt states was recently seen here for low back pain. States she was told if she began having diarrhea to report back to the ED immediately. Pt began having a lot of watery stool around 17:30 this afternoon, states it comes very suddenly without warning. Denies abdominal pain, nausea, vomiting.

## 2015-10-29 NOTE — ED Notes (Signed)
Nurse drawing labs. 

## 2015-10-29 NOTE — ED Provider Notes (Signed)
CSN: [id number]     Arrival date & time 10/29/15  1821 History   First MD Initiated Contact with Patient 10/29/15 1850     Chief Complaint  Patient presents with  . Diarrhea     (Consider location/radiation/quality/duration/timing/severity/associated sxs/prior Treatment) Patient is a 81 y.o. female presenting with diarrhea. The history is provided by the patient.  Diarrhea She presented with sudden onset of diarrhea tonight. Should been seen in emergency department yesterday for back pain and 2 days ago for urinary tract infection. She was told to return to the ED if she develops vomiting or diarrhea. She had been prescribed ciprofloxacin and metronidazole when she was in the ED 2 days ago. Yesterday, she presented with back pain which was felt to be due to muscle spasm. She was prescribed diazepam and hydrocodone-acetaminophen. She states she has not taken hydrocodone-acetaminophen but has taken the diazepam. Her initial presentation was for left side pain which has improved. She denies fever, chills, sweats. She denies any dysuria or urinary frequency. She denies any weakness, numbness, tingling.  Past Medical History  Diagnosis Date  . Hyperlipidemia   . Hypertension   . Colitis 12/2013    "never had this before"  . Family history of anesthesia complication     "mother; think they gave her too much; c/o pain after OR; gave her more RX; they had to bring her back real quick"  . COPD (chronic obstructive pulmonary disease) (HCC)   . Exertional shortness of breath     "off and on" (12/10/2013)  . Arthritis     "maybe a little in my knees and back" (12/10/2013)  . Hiatal hernia   . Stenosis of tear duct   . Shingles   . H/O calcium pyrophosphate deposition disease (CPPD)   . DOE (dyspnea on exertion)   . PAD (peripheral artery disease) (HCC)   . Allergic rhinitis   . Proteinuria    Past Surgical History  Procedure Laterality Date  . Tonsillectomy and adenoidectomy  ?1940's  .  Cholecystectomy  ~ 2009  . Back surgery    . Lumbar disc surgery  ?2004  . Abdominal hysterectomy  1960's  . Cesarean section  89; 1960   Family History  Problem Relation Age of Onset  . Emphysema Father 82  . CVA Mother   . Cancer Brother     ? type   Social History  Substance Use Topics  . Smoking status: Never Smoker   . Smokeless tobacco: Never Used  . Alcohol Use: 2.4 oz/week    4 Glasses of wine per week   OB History    No data available     Review of Systems  Gastrointestinal: Positive for diarrhea.  All other systems reviewed and are negative.     Allergies  Codeine; Crestor; Darvon; Lipitor; and Zocor  Home Medications   Prior to Admission medications   Medication Sig Start Date End Date Taking? Authorizing Provider  acetaminophen (TYLENOL) 500 MG tablet Take 500 mg by mouth every 6 (six) hours as needed for moderate pain.    Historical Provider, MD  Ascorbic Acid (VITAMIN C PO) Take 1 tablet by mouth daily.    Historical Provider, MD  aspirin EC 81 MG tablet Take 81 mg by mouth every evening.     Historical Provider, MD  budesonide-formoterol (SYMBICORT) 160-4.5 MCG/ACT inhaler Inhale 2 puffs into the lungs 2 (two) times daily.    Historical Provider, MD  Cholecalciferol (VITAMIN D PO) Take 1 capsule  by mouth every evening.    Historical Provider, MD  cilostazol (PLETAL) 50 MG tablet Take 50 mg by mouth 2 (two) times daily.  11/30/14   Historical Provider, MD  ciprofloxacin (CIPRO) 500 MG tablet Take 1 tablet (500 mg total) by mouth 2 (two) times daily. Patient taking differently: Take 500 mg by mouth 2 (two) times daily. For 10 days 11-21 to 12-1 10/27/15   Barrett Henle, PA-C  diazepam (VALIUM) 5 MG tablet Take 0.5 tablets (2.5 mg total) by mouth every 6 (six) hours as needed for muscle spasms. 10/28/15   Courteney Lyn Mackuen, MD  diphenhydramine-acetaminophen (TYLENOL PM) 25-500 MG TABS Take 1 tablet by mouth at bedtime as needed (pain/sleep).      Historical Provider, MD  ezetimibe (ZETIA) 10 MG tablet Take 10 mg by mouth every evening.    Historical Provider, MD  HYDROcodone-acetaminophen (NORCO/VICODIN) 5-325 MG tablet Take 1 tablet by mouth every 4 (four) hours as needed. Patient not taking: Reported on 10/28/2015 10/27/15   Barrett Henle, PA-C  ibuprofen (ADVIL,MOTRIN) 800 MG tablet Take 1 tablet (800 mg total) by mouth 3 (three) times daily. 10/28/15   Courteney Lyn Mackuen, MD  lisinopril-hydrochlorothiazide (PRINZIDE,ZESTORETIC) 20-25 MG per tablet Take 1 tablet by mouth every evening.     Historical Provider, MD  metoprolol tartrate (LOPRESSOR) 25 MG tablet Take 0.5 tablets (12.5 mg total) by mouth 2 (two) times daily. 06/16/15   Lyn Records, MD  metroNIDAZOLE (FLAGYL) 500 MG tablet Take 1 tablet (500 mg total) by mouth 3 (three) times daily. Patient taking differently: Take 500 mg by mouth 3 (three) times daily. For 10 days 11-21 to 12-1 10/27/15   Barrett Henle, PA-C  naproxen (NAPROSYN) 500 MG tablet Take 500 mg by mouth as needed for moderate pain.     Historical Provider, MD  ondansetron (ZOFRAN) 4 MG tablet Take 1 tablet (4 mg total) by mouth every 8 (eight) hours as needed for nausea or vomiting. 10/28/15   Courteney Lyn Mackuen, MD   BP 178/56 mmHg  Pulse 48  Temp(Src) 97.6 F (36.4 C) (Oral)  Resp 18  SpO2 98% Physical Exam  Nursing note and vitals reviewed.  81 year old female, resting comfortably and in no acute distress. Vital signs are significant for bradycardia and hypertension. Oxygen saturation is 98%, which is normal. Head is normocephalic and atraumatic. PERRLA, EOMI. Oropharynx is clear. Neck is nontender and supple without adenopathy or JVD. Back is nontender and there is no CVA tenderness. Lungs are clear without rales, wheezes, or rhonchi. Chest is nontender. Heart has regular rate and rhythm without murmur. Abdomen is soft, flat, nontender without masses or hepatosplenomegaly  and peristalsis is normoactive. Extremities have no cyanosis or edema, full range of motion is present. Skin is warm and dry without rash. Neurologic: Mental status is normal, cranial nerves are intact, there are no motor or sensory deficits.  ED Course  Procedures (including critical care time) Labs Review Results for orders placed or performed during the hospital encounter of 10/29/15  CBC with Differential  Result Value Ref Range   WBC 11.6 (H) 4.0 - 10.5 K/uL   RBC 3.96 3.87 - 5.11 MIL/uL   Hemoglobin 12.8 12.0 - 15.0 g/dL   HCT 96.0 45.4 - 09.8 %   MCV 98.7 78.0 - 100.0 fL   MCH 32.3 26.0 - 34.0 pg   MCHC 32.7 30.0 - 36.0 g/dL   RDW 11.9 14.7 - 82.9 %   Platelets  234 150 - 400 K/uL   Neutrophils Relative % 69 %   Neutro Abs 7.9 (H) 1.7 - 7.7 K/uL   Lymphocytes Relative 22 %   Lymphs Abs 2.5 0.7 - 4.0 K/uL   Monocytes Relative 7 %   Monocytes Absolute 0.8 0.1 - 1.0 K/uL   Eosinophils Relative 2 %   Eosinophils Absolute 0.3 0.0 - 0.7 K/uL   Basophils Relative 0 %   Basophils Absolute 0.0 0.0 - 0.1 K/uL  Basic metabolic panel  Result Value Ref Range   Sodium 140 135 - 145 mmol/L   Potassium 4.1 3.5 - 5.1 mmol/L   Chloride 106 101 - 111 mmol/L   CO2 23 22 - 32 mmol/L   Glucose, Bld 99 65 - 99 mg/dL   BUN 33 (H) 6 - 20 mg/dL   Creatinine, Ser 4.541.63 (H) 0.44 - 1.00 mg/dL   Calcium 09.810.1 8.9 - 11.910.3 mg/dL   GFR calc non Af Amer 28 (L) >60 mL/min   GFR calc Af Amer 33 (L) >60 mL/min   Anion gap 11 5 - 15  Urinalysis, Routine w reflex microscopic  Result Value Ref Range   Color, Urine ORANGE (A) YELLOW   APPearance CLOUDY (A) CLEAR   Specific Gravity, Urine 1.012 1.005 - 1.030   pH 6.0 5.0 - 8.0   Glucose, UA NEGATIVE NEGATIVE mg/dL   Hgb urine dipstick NEGATIVE NEGATIVE   Bilirubin Urine NEGATIVE NEGATIVE   Ketones, ur NEGATIVE NEGATIVE mg/dL   Protein, ur 30 (A) NEGATIVE mg/dL   Nitrite NEGATIVE NEGATIVE   Leukocytes, UA TRACE (A) NEGATIVE  Urine microscopic-add on   Result Value Ref Range   Squamous Epithelial / LPF TOO NUMEROUS TO COUNT (A) NONE SEEN   WBC, UA 0-5 0 - 5 WBC/hpf   RBC / HPF 0-5 0 - 5 RBC/hpf   Bacteria, UA FEW (A) NONE SEEN   I have personally reviewed and evaluated these lab results as part of my medical decision-making.   MDM   Final diagnoses:  Acute kidney injury (nontraumatic) (HCC)  Diarrhea, unspecified type    Diarrhea in patient who has been on antibiotics for 2 days. Old records reviewed and a urinalysis did show positive nitrite 1 check 2 days ago. Unfortunately, urine culture was not sent. She states she has a history of Clostridium difficile, but diarrhea today was not significantly malodorous. I will check her urinalysis today. If it looks clean, will try her off of antibiotics. She is given loperamide for diarrhea.  Urinalysis shows no evidence of ongoing infection, but creatinine has shown a significant increase over a 24-hour period. Therefore, she will need to be admitted for ongoing hydration and rechecking metabolic panel. Case is discussed with Dr. Adela Glimpseoutova of [facility] who agrees to admit the patient under observation status.  Dione Boozeavid Dontavius Keim, MD 10/29/15 2120

## 2015-10-29 NOTE — H&P (Addendum)
PCP:  Lupita Raider, MD    Referring provider Preston Fleeting   Chief Complaint:  Back pain HPI: Sydney Gross is a 81 y.o. female   has a past medical history of Hyperlipidemia; Hypertension; Colitis (12/2013); Family history of anesthesia complication; COPD (chronic obstructive pulmonary disease) (HCC); Exertional shortness of breath; Arthritis; Hiatal hernia; Stenosis of tear duct; Shingles; H/O calcium pyrophosphate deposition disease (CPPD); DOE (dyspnea on exertion); PAD (peripheral artery disease) (HCC); Allergic rhinitis; and Proteinuria.   Presented with diarrhea 3 BM since this afternoon. Patient has hx of c.diff 2 years ago and was concerned so today she presented to emergency department.  She was seen just 2 days ago in ER with back and Left lower quadrant pain was diagnosed with UTI and treated with Cipro Ct abd/pelvis with consraat was done showing no diverticulitis but given her symptoms Flagyl was added for empiric coverage.  Toady in ER Cr ws noted to be up to 1.68 from baseline of 0.97. Patient makes urine  She deneis any fever no chills. NO nausea, no vomiting she is not lightheaded with sitting. Her back pain and abdominal pain has resolved now. Patient endorses some abdominal distention but otherwise unremarkable. Of note repeat UA shown no evidence of UTI. She didn't endorse having mild URI symptoms for the past 1 week with occasional coughing and mild wheezing. She never smoked has no history of COPD or asthma In emergency department patient transiently was prominent episode of bradycardia down to 48 but currently has improved  Hospitalist was called for admission for acute renal failure  Review of Systems:    Pertinent positives include: abdominal pain back pain  Constitutional:  No weight loss, night sweats, Fevers, chills, fatigue, weight loss  HEENT:  No headaches, Difficulty swallowing,Tooth/dental problems,Sore throat,  No sneezing, itching, ear ache, nasal  congestion, post nasal drip,  Cardio-vascular:  No chest pain, Orthopnea, PND, anasarca, dizziness, palpitations.no Bilateral lower extremity swelling  GI:  No heartburn, indigestion,  nausea, vomiting, diarrhea, change in bowel habits, loss of appetite, melena, blood in stool, hematemesis Resp:  no shortness of breath at rest. No dyspnea on exertion, No excess mucus, no productive cough, No non-productive cough, No coughing up of blood.No change in color of mucus.No wheezing. Skin:  no rash or lesions. No jaundice GU:  no dysuria, change in color of urine, no urgency or frequency. No straining to urinate.  No flank pain.  Musculoskeletal:  No joint pain or no joint swelling. No decreased range of motion. No back pain.  Psych:  No change in mood or affect. No depression or anxiety. No memory loss.  Neuro: no localizing neurological complaints, no tingling, no weakness, no double vision, no gait abnormality, no slurred speech, no confusion  Otherwise ROS are negative except for above, 10 systems were reviewed  Past Medical History: Past Medical History  Diagnosis Date  . Hyperlipidemia   . Hypertension   . Colitis 12/2013    "never had this before"  . Family history of anesthesia complication     "mother; think they gave her too much; c/o pain after OR; gave her more RX; they had to bring her back real quick"  . COPD (chronic obstructive pulmonary disease) (HCC)   . Exertional shortness of breath     "off and on" (12/10/2013)  . Arthritis     "maybe a little in my knees and back" (12/10/2013)  . Hiatal hernia   . Stenosis of tear duct   . Shingles   .  H/O calcium pyrophosphate deposition disease (CPPD)   . DOE (dyspnea on exertion)   . PAD (peripheral artery disease) (HCC)   . Allergic rhinitis   . Proteinuria    Past Surgical History  Procedure Laterality Date  . Tonsillectomy and adenoidectomy  ?1940's  . Cholecystectomy  ~ 2009  . Back surgery    . Lumbar disc surgery   ?2004  . Abdominal hysterectomy  1960's  . Cesarean section  1957; 1960     Medications: Prior to Admission medications   Medication Sig Start Date End Date Taking? Authorizing Provider  acetaminophen (TYLENOL) 500 MG tablet Take 500 mg by mouth every 6 (six) hours as needed for moderate pain.   Yes Historical Provider, MD  Ascorbic Acid (VITAMIN C PO) Take 1 tablet by mouth daily.   Yes Historical Provider, MD  aspirin EC 81 MG tablet Take 81 mg by mouth every evening.    Yes Historical Provider, MD  budesonide-formoterol (SYMBICORT) 160-4.5 MCG/ACT inhaler Inhale 2 puffs into the lungs 2 (two) times daily.   Yes Historical Provider, MD  Cholecalciferol (VITAMIN D PO) Take 1 capsule by mouth every evening.   Yes Historical Provider, MD  cilostazol (PLETAL) 50 MG tablet Take 50 mg by mouth 2 (two) times daily.  11/30/14  Yes Historical Provider, MD  ciprofloxacin (CIPRO) 500 MG tablet Take 1 tablet (500 mg total) by mouth 2 (two) times daily. Patient taking differently: Take 500 mg by mouth 2 (two) times daily. For 10 days 11-21 to 12-1 10/27/15  Yes Barrett Henle, PA-C  diazepam (VALIUM) 5 MG tablet Take 0.5 tablets (2.5 mg total) by mouth every 6 (six) hours as needed for muscle spasms. 10/28/15  Yes Courteney Lyn Mackuen, MD  diphenhydramine-acetaminophen (TYLENOL PM) 25-500 MG TABS Take 1 tablet by mouth at bedtime as needed (pain/sleep).    Yes Historical Provider, MD  ezetimibe (ZETIA) 10 MG tablet Take 10 mg by mouth every evening.   Yes Historical Provider, MD  ibuprofen (ADVIL,MOTRIN) 800 MG tablet Take 1 tablet (800 mg total) by mouth 3 (three) times daily. 10/28/15  Yes Courteney Lyn Mackuen, MD  lisinopril-hydrochlorothiazide (PRINZIDE,ZESTORETIC) 20-25 MG per tablet Take 1 tablet by mouth every evening.    Yes Historical Provider, MD  metoprolol tartrate (LOPRESSOR) 25 MG tablet Take 0.5 tablets (12.5 mg total) by mouth 2 (two) times daily. 06/16/15  Yes Lyn Records, MD    metroNIDAZOLE (FLAGYL) 500 MG tablet Take 1 tablet (500 mg total) by mouth 3 (three) times daily. Patient taking differently: Take 500 mg by mouth 3 (three) times daily. For 10 days 11-21 to 12-1 10/27/15  Yes Barrett Henle, PA-C  naproxen (NAPROSYN) 500 MG tablet Take 500 mg by mouth as needed for moderate pain.    Yes Historical Provider, MD  HYDROcodone-acetaminophen (NORCO/VICODIN) 5-325 MG tablet Take 1 tablet by mouth every 4 (four) hours as needed. Patient not taking: Reported on 10/28/2015 10/27/15   Barrett Henle, PA-C  ondansetron (ZOFRAN) 4 MG tablet Take 1 tablet (4 mg total) by mouth every 8 (eight) hours as needed for nausea or vomiting. 10/28/15   Courteney Lyn Mackuen, MD    Allergies:   Allergies  Allergen Reactions  . Codeine     GI upset  . Crestor [Rosuvastatin] Other (See Comments)    MYALGIAS  . Darvon [Propoxyphene] Nausea Only  . Lipitor [Atorvastatin] Other (See Comments)    MYALGIAS  . Zocor [Simvastatin] Other (See Comments)  MYALGIAS    Social History:  Ambulatory  walker  Since back pain symptoms for 3 days ususaly independent Lives at home With family     reports that she has never smoked. She has never used smokeless tobacco. She reports that she drinks about 2.4 oz of alcohol per week. She reports that she does not use illicit drugs.    Family History: family history includes CVA in her mother; Cancer in her brother; Emphysema (age of onset: 3369) in her father.    Physical Exam: Patient Vitals for the past 24 hrs:  BP Temp Temp src Pulse Resp SpO2  10/29/15 1830 178/56 mmHg 97.6 F (36.4 C) Oral (!) 48 18 98 %    1. General:  in No Acute distress 2. Psychological: Alert and  Oriented 3. Head/ENT:     Dry Mucous Membranes                          Head Non traumatic, neck supple                          Normal   Dentition 4. SKIN:   decreased Skin turgor,  Skin clean Dry and intact no rash 5. Heart: Regular rate and  rhythm no Murmur, Rub or gallop 6. Lungs: mild occasional wheezes some crackles   7. Abdomen: Soft, non-tender, slightly distended 8. Lower extremities: no clubbing, cyanosis, or edema 9. Neurologically Grossly intact, moving all 4 extremities equally 10. MSK: Normal range of motion  body mass index is unknown because there is no weight on file.   Labs on Admission:   Results for orders placed or performed during the hospital encounter of 10/29/15 (from the past 24 hour(s))  CBC with Differential     Status: Abnormal   Collection Time: 10/29/15  7:36 PM  Result Value Ref Range   WBC 11.6 (H) 4.0 - 10.5 K/uL   RBC 3.96 3.87 - 5.11 MIL/uL   Hemoglobin 12.8 12.0 - 15.0 g/dL   HCT 40.939.1 81.136.0 - 91.446.0 %   MCV 98.7 78.0 - 100.0 fL   MCH 32.3 26.0 - 34.0 pg   MCHC 32.7 30.0 - 36.0 g/dL   RDW 78.213.4 95.611.5 - 21.315.5 %   Platelets 234 150 - 400 K/uL   Neutrophils Relative % 69 %   Neutro Abs 7.9 (H) 1.7 - 7.7 K/uL   Lymphocytes Relative 22 %   Lymphs Abs 2.5 0.7 - 4.0 K/uL   Monocytes Relative 7 %   Monocytes Absolute 0.8 0.1 - 1.0 K/uL   Eosinophils Relative 2 %   Eosinophils Absolute 0.3 0.0 - 0.7 K/uL   Basophils Relative 0 %   Basophils Absolute 0.0 0.0 - 0.1 K/uL  Basic metabolic panel     Status: Abnormal   Collection Time: 10/29/15  7:36 PM  Result Value Ref Range   Sodium 140 135 - 145 mmol/L   Potassium 4.1 3.5 - 5.1 mmol/L   Chloride 106 101 - 111 mmol/L   CO2 23 22 - 32 mmol/L   Glucose, Bld 99 65 - 99 mg/dL   BUN 33 (H) 6 - 20 mg/dL   Creatinine, Ser 0.861.63 (H) 0.44 - 1.00 mg/dL   Calcium 57.810.1 8.9 - 46.910.3 mg/dL   GFR calc non Af Amer 28 (L) >60 mL/min   GFR calc Af Amer 33 (L) >60 mL/min   Anion gap 11 5 - 15  Urinalysis, Routine w reflex microscopic     Status: Abnormal   Collection Time: 10/29/15  7:55 PM  Result Value Ref Range   Color, Urine ORANGE (A) YELLOW   APPearance CLOUDY (A) CLEAR   Specific Gravity, Urine 1.012 1.005 - 1.030   pH 6.0 5.0 - 8.0   Glucose, UA  NEGATIVE NEGATIVE mg/dL   Hgb urine dipstick NEGATIVE NEGATIVE   Bilirubin Urine NEGATIVE NEGATIVE   Ketones, ur NEGATIVE NEGATIVE mg/dL   Protein, ur 30 (A) NEGATIVE mg/dL   Nitrite NEGATIVE NEGATIVE   Leukocytes, UA TRACE (A) NEGATIVE  Urine microscopic-add on     Status: Abnormal   Collection Time: 10/29/15  7:55 PM  Result Value Ref Range   Squamous Epithelial / LPF TOO NUMEROUS TO COUNT (A) NONE SEEN   WBC, UA 0-5 0 - 5 WBC/hpf   RBC / HPF 0-5 0 - 5 RBC/hpf   Bacteria, UA FEW (A) NONE SEEN    UA too numerous   No results found for: HGBA1C  CrCl cannot be calculated (Unknown ideal weight.).  BNP (last 3 results) No results for input(s): PROBNP in the last 8760 hours.  Other results:  I have pearsonaly reviewed this: ECG REPORT Not obtained  There were no vitals filed for this visit.   Cultures:    Component Value Date/Time   SDES BLOOD LEFT ARM 12/10/2013 1320   SPECREQUEST BOTTLES DRAWN AEROBIC AND ANAEROBIC 10CC 12/10/2013 1320   CULT  12/10/2013 1320    NO GROWTH 5 DAYS Performed at [facility]   REPTSTATUS 12/16/2013 FINAL 12/10/2013 1320     Radiological Exams on Admission: No results found.  Chart has been reviewed  Family  at  Bedside  plan of care was discussed with   Husband Meyah Corle,  [phone]  Assessment/Plan  81 year old female with history of hypertension presents with diarrhea was found to have acute renal failure evidence of dehydration in a setting of recent contrasted CT study, and NSAID use, being on lisinopril.   Present on Admission:  . Hypertension for tonight tonight hold  toprolol given bradycardia. Hold lisinopril given acute renal failure . Acute renal failure (HCC) - likely secondary to dehydration, check FeNA and if not improved with IVF   . Diarrhea obtain stool culture and check for C. difficile given prior history and antibiotic exposure   history of UTI for now will discontinue Cipro and Flagyl given  diarrhea no evidence of UTI currently no urine culture has been obtained in the past patient is asymptomatic  . Dehydration will rehydrate Bradycardia transient hold Toprol for now resume metoprolol once able check TSH Cough - given abnormal lung exam recent history of cough and URI symptoms will obtain chest x-ray and administer albuterol when necessary and monitor closely Prophylaxis: SCD  CODE STATUS: FULL CODE  as per patient    Disposition:  To home once workup is complete and patient is stable  Other plan as per orders.  I have spent a total of 55 min on this admission  Shamar Engelmann 10/29/2015, 9:03 PM  [facility]  Pager [phone]   after 2 AM please page floor coverage PA If 7AM-7PM, please contact the day team taking care of the patient  [email or web address]  Password [email or web address]

## 2015-10-30 DIAGNOSIS — R112 Nausea with vomiting, unspecified: Secondary | ICD-10-CM | POA: Diagnosis not present

## 2015-10-30 DIAGNOSIS — N390 Urinary tract infection, site not specified: Secondary | ICD-10-CM | POA: Diagnosis not present

## 2015-10-30 DIAGNOSIS — N179 Acute kidney failure, unspecified: Secondary | ICD-10-CM | POA: Diagnosis not present

## 2015-10-30 DIAGNOSIS — E860 Dehydration: Secondary | ICD-10-CM | POA: Diagnosis not present

## 2015-10-30 DIAGNOSIS — R197 Diarrhea, unspecified: Secondary | ICD-10-CM | POA: Diagnosis not present

## 2015-10-30 DIAGNOSIS — I10 Essential (primary) hypertension: Secondary | ICD-10-CM | POA: Diagnosis not present

## 2015-10-30 DIAGNOSIS — R001 Bradycardia, unspecified: Secondary | ICD-10-CM | POA: Diagnosis not present

## 2015-10-30 LAB — COMPREHENSIVE METABOLIC PANEL
ALT: 12 U/L — ABNORMAL LOW (ref 14–54)
AST: 15 U/L (ref 15–41)
Albumin: 3.5 g/dL (ref 3.5–5.0)
Alkaline Phosphatase: 51 U/L (ref 38–126)
Anion gap: 7 (ref 5–15)
BUN: 29 mg/dL — ABNORMAL HIGH (ref 6–20)
CO2: 23 mmol/L (ref 22–32)
Calcium: 9.1 mg/dL (ref 8.9–10.3)
Chloride: 113 mmol/L — ABNORMAL HIGH (ref 101–111)
Creatinine, Ser: 1.42 mg/dL — ABNORMAL HIGH (ref 0.44–1.00)
GFR calc Af Amer: 38 mL/min — ABNORMAL LOW (ref >60)
GFR calc non Af Amer: 33 mL/min — ABNORMAL LOW (ref >60)
Glucose, Bld: 94 mg/dL (ref 65–99)
Potassium: 3.9 mmol/L (ref 3.5–5.1)
Sodium: 143 mmol/L (ref 135–145)
Total Bilirubin: 0.8 mg/dL (ref 0.3–1.2)
Total Protein: 5.6 g/dL — ABNORMAL LOW (ref 6.5–8.1)

## 2015-10-30 LAB — C DIFFICILE QUICK SCREEN W PCR REFLEX
C Diff antigen: NEGATIVE
C Diff interpretation: NEGATIVE
C Diff toxin: NEGATIVE

## 2015-10-30 LAB — CBC
HCT: 35.1 % — ABNORMAL LOW (ref 36.0–46.0)
Hemoglobin: 11.4 g/dL — ABNORMAL LOW (ref 12.0–15.0)
MCH: 32 pg (ref 26.0–34.0)
MCHC: 32.5 g/dL (ref 30.0–36.0)
MCV: 98.6 fL (ref 78.0–100.0)
Platelets: 201 10*3/uL (ref 150–400)
RBC: 3.56 MIL/uL — ABNORMAL LOW (ref 3.87–5.11)
RDW: 13.2 % (ref 11.5–15.5)
WBC: 8.3 10*3/uL (ref 4.0–10.5)

## 2015-10-30 LAB — PHOSPHORUS: Phosphorus: 4.3 mg/dL (ref 2.5–4.6)

## 2015-10-30 LAB — SODIUM, URINE, RANDOM: Sodium, Ur: 64 mmol/L

## 2015-10-30 LAB — TSH: TSH: 1.371 u[IU]/mL (ref 0.350–4.500)

## 2015-10-30 LAB — MAGNESIUM: Magnesium: 2 mg/dL (ref 1.7–2.4)

## 2015-10-30 LAB — CREATININE, URINE, RANDOM: Creatinine, Urine: 61.91 mg/dL

## 2015-10-30 NOTE — Progress Notes (Signed)
[facility] PROGRESS NOTE  Sydney Gross:[id number] DOB: Sep 25, 1930 DOA: 10/29/2015 PCP: Lupita Raider, MD  Assessment/Plan: Active Problems:   Hypertension - Currently holding antihypertensive regimen secondary to bradycardia and a KI.    Acute renal failure (HCC) - In context of patient on ibuprofen and ACE inhibitor at home. Improving off of nephrotoxic agents.    Diarrhea - Resolved    Dehydration -Resolving after improved oral intake and initial IV fluids.    Bradycardia - We'll continue to hold metoprolol and reassess next a.m.  Code Status: Full code Family Communication: Discussed with patient and family member/spouse Disposition Plan: Pending improvement in condition and resolution of bradycardia   Consultants:  None  Procedures:  None  Antibiotics:  None  HPI/Subjective: Pt has no new complaints. No acute issues overnight.  Objective: Filed Vitals:   10/30/15 0500 10/30/15 1354  BP: 125/86 117/37  Pulse: 32 87  Temp: 98.2 F (36.8 C) 98.9 F (37.2 C)  Resp: 18 18    Intake/Output Summary (Last 24 hours) at 10/30/15 1527 Last data filed at 10/30/15 0715  Gross per 24 hour  Intake      0 ml  Output    430 ml  Net   -430 ml   Filed Weights   10/29/15 2322  Weight: 57.6 kg (126 lb 15.8 oz)    Exam:   General:  Pt in nad, alert and awake  Cardiovascular: rrr, no mrg  Respiratory: cta bl, no wheezes  Abdomen: soft, NT, ND  Musculoskeletal: no cyanosis or clubbing   Data Reviewed: Basic Metabolic Panel:  Recent Labs Lab 10/27/15 1013 10/28/15 0842 10/29/15 1936 10/30/15 0525  NA 139 140 140 143  K 4.1 4.5 4.1 3.9  CL 104 103 106 113*  CO2 GLUCOSE 99 105* 99 94  BUN 22* 27* 33* 29*  CREATININE 0.84 0.97 1.63* 1.42*  CALCIUM 9.6 9.7 10.1 9.1  MG  --   --   --  2.0  PHOS  --   --   --  4.3   Liver Function Tests:  Recent Labs Lab 10/27/15 1013 10/28/15 0842 10/30/15 0525  AST ALT 13* 13* 12*  ALKPHOS 66 66 51  BILITOT 0.7 0.9 0.8  PROT 6.9 6.7 5.6*  ALBUMIN 4.3 4.1 3.5    Recent Labs Lab 10/27/15 1013  LIPASE 39   No results for input(s): AMMONIA in the last 168 hours. CBC:  Recent Labs Lab 10/27/15 1013 10/28/15 0842 10/29/15 1936 10/30/15 0525  WBC 8.3 9.6 11.6* 8.3  NEUTROABS 5.0 6.8 7.9*  --   HGB 12.8 12.9 12.8 11.4*  HCT 39.7 39.4 39.1 35.1*  MCV 99.5 98.0 98.7 98.6  PLT 229 231 234 201   Cardiac Enzymes: No results for input(s): CKTOTAL, CKMB, CKMBINDEX, TROPONINI in the last 168 hours. BNP (last 3 results) No results for input(s): BNP in the last 8760 hours.  ProBNP (last 3 results) No results for input(s): PROBNP in the last 8760 hours.  CBG: No results for input(s): GLUCAP in the last 168 hours.  No results found for this or any previous visit (from the past 240 hour(s)).   Studies: Dg Chest 2 View  10/29/2015  CLINICAL DATA:  Dry cough for 2 weeks.  COPD. EXAM: CHEST  2 VIEW COMPARISON:  12/10/2013 FINDINGS: Levoconvex thoracic scoliosis. Mild enlargement of the cardiopericardial silhouette with faint Kerley B-lines bilaterally. No airspace opacity observed. No pleural effusion  identified. Flattened hemidiaphragms. Bony demineralization. IMPRESSION: 1. Mild cardiomegaly with faint Kerley B-lines suggesting subtle interstitial edema. No airspace opacity identified. 2. Levoconvex thoracic scoliosis. 3. Bony demineralization. Electronically Signed   By: Gaylyn RongWalter  Liebkemann M.D.   On: 10/29/2015 22:48    Scheduled Meds: . aspirin EC  81 mg Oral QPM  . budesonide-formoterol  2 puff Inhalation BID  . cilostazol  50 mg Oral BID  . enoxaparin (LOVENOX) injection  30 mg Subcutaneous QHS  . ezetimibe  10 mg Oral QPM  . sodium chloride  3 mL Intravenous Q12H   Continuous Infusions:   Time spent: > 35 minutes    Penny PiaVEGA, Karlos Scadden  [facility] Pager [phone]. If 7PM-7AM, please contact night-coverage at [email or web address],  password [email or web address] 10/30/2015, 3:27 PM

## 2015-10-31 DIAGNOSIS — R001 Bradycardia, unspecified: Secondary | ICD-10-CM | POA: Diagnosis not present

## 2015-10-31 DIAGNOSIS — N179 Acute kidney failure, unspecified: Secondary | ICD-10-CM | POA: Diagnosis not present

## 2015-10-31 MED ORDER — AMLODIPINE BESYLATE 5 MG PO TABS: 5.0000 mg | ORAL_TABLET | Freq: Every day | ORAL | Status: DC

## 2015-10-31 MED ORDER — CEPHALEXIN 250 MG PO CAPS: 250.0000 mg | ORAL_CAPSULE | Freq: Two times a day (BID) | ORAL | Status: DC

## 2015-10-31 MED ORDER — AMLODIPINE BESYLATE 5 MG PO TABS
5.0000 mg | ORAL_TABLET | Freq: Every day | ORAL | Status: DC
  Administered 2015-10-31: 5 mg via ORAL
  Filled 2015-10-31: qty 1

## 2015-10-31 NOTE — Discharge Summary (Addendum)
Physician Discharge Summary  Sydney Gross ZOX:[id number] DOB: [birth year] DOA: 10/29/2015  PCP: Lupita Raider, MD  Admit date: 10/29/2015 Discharge date: 10/31/2015  Time spent: > 35 minutes  Recommendations for Outpatient Follow-up:  1. Pt has no new complaints. No acute issues overnight. 2. Will d/c on Keflex for recent UTI   Discharge Diagnoses:  Active Problems:   Acute renal failure (HCC)   Hypertension   Bradycardia   Diarrhea   Acute kidney injury (nontraumatic) (HCC)   Dehydration   Discharge Condition: stable  Diet recommendation: Heart healthy/Low sodium  Filed Weights   10/29/15 2322  Weight: 57.6 kg (126 lb 15.8 oz)    History of present illness:  From original HPI: 81 y.o. female   has a past medical history of Hyperlipidemia; Hypertension; Colitis (12/2013); Family history of anesthesia complication; COPD (chronic obstructive pulmonary disease) (HCC); Exertional shortness of breath; Arthritis; Hiatal hernia; Stenosis of tear duct; Shingles; H/O calcium pyrophosphate deposition disease (CPPD); DOE (dyspnea on exertion); PAD (peripheral artery disease) (HCC); Allergic rhinitis; and Proteinuria.   Presented with diarrhea 3 BM since this afternoon. Patient has hx of c.diff 2 years ago and was concerned so today she presented to emergency department.  She was seen just 2 days ago in ER with back and Left lower quadrant pain was diagnosed with UTI and treated with Cipro Ct abd/pelvis with consraat was done showing no diverticulitis but given her symptoms Flagyl was added for empiric coverage.   Patient was admitted with dehydration and acute renal injury.  Hospital Course:  Acute renal injury - Resolved after holding nephrotoxic agents NSAIDs and lisinopril. - Also resolved with improvement in oral intake and IV fluids initially.  UTI -Will discharge on low-dose Keflex and treat for 3 days  Nausea vomiting and diarrhea - Improving currently no  reports of emesis. - Suspect viral gastroenteritis  Addendum: Bradycardia  -most likely side effect of low-dose beta blocker. Will discontinue on discharge  Procedures:  None  Consultations:  None  Discharge Exam: Filed Vitals:   10/31/15 0611 10/31/15 1033  BP: 179/63 171/69  Pulse: 89 87  Temp: 98.4 F (36.9 C)   Resp: 18 18    General: Patient in no acute distress, alert and awake Cardiovascular: Regular rate and rhythm, no murmurs or rubs Respiratory: Clear to auscultation bilaterally, no wheezes  Discharge Instructions    Current Discharge Medication List    START taking these medications   Details  amLODipine (NORVASC) 5 MG tablet Take 1 tablet (5 mg total) by mouth daily. Qty: 30 tablet, Refills: 0    cephALEXin (KEFLEX) 250 MG capsule Take 1 capsule (250 mg total) by mouth 2 (two) times daily. Qty: 6 capsule, Refills: 0      CONTINUE these medications which have NOT CHANGED   Details  acetaminophen (TYLENOL) 500 MG tablet Take 500 mg by mouth every 6 (six) hours as needed for moderate pain.    Ascorbic Acid (VITAMIN C PO) Take 1 tablet by mouth daily.    aspirin EC 81 MG tablet Take 81 mg by mouth every evening.     budesonide-formoterol (SYMBICORT) 160-4.5 MCG/ACT inhaler Inhale 2 puffs into the lungs 2 (two) times daily.    Cholecalciferol (VITAMIN D PO) Take 1 capsule by mouth every evening.    cilostazol (PLETAL) 50 MG tablet Take 50 mg by mouth 2 (two) times daily.  Refills: 6    diazepam (VALIUM) 5 MG tablet Take 0.5 tablets (2.5 mg total) by mouth every  6 (six) hours as needed for muscle spasms. Qty: 10 tablet, Refills: 0    diphenhydramine-acetaminophen (TYLENOL PM) 25-500 MG TABS Take 1 tablet by mouth at bedtime as needed (pain/sleep).     ezetimibe (ZETIA) 10 MG tablet Take 10 mg by mouth every evening.    HYDROcodone-acetaminophen (NORCO/VICODIN) 5-325 MG tablet Take 1 tablet by mouth every 4 (four) hours as needed. Qty: 10  tablet, Refills: 0    ondansetron (ZOFRAN) 4 MG tablet Take 1 tablet (4 mg total) by mouth every 8 (eight) hours as needed for nausea or vomiting. Qty: 11 tablet, Refills: 0      STOP taking these medications     ciprofloxacin (CIPRO) 500 MG tablet      ibuprofen (ADVIL,MOTRIN) 800 MG tablet      lisinopril-hydrochlorothiazide (PRINZIDE,ZESTORETIC) 20-25 MG per tablet      metoprolol tartrate (LOPRESSOR) 25 MG tablet      metroNIDAZOLE (FLAGYL) 500 MG tablet      naproxen (NAPROSYN) 500 MG tablet        Allergies  Allergen Reactions  . Codeine     GI upset  . Crestor [Rosuvastatin] Other (See Comments)    MYALGIAS  . Darvon [Propoxyphene] Nausea Only  . Lipitor [Atorvastatin] Other (See Comments)    MYALGIAS  . Zocor [Simvastatin] Other (See Comments)    MYALGIAS      The results of significant diagnostics from this hospitalization (including imaging, microbiology, ancillary and laboratory) are listed below for reference.    Significant Diagnostic Studies: Dg Chest 2 View  10/29/2015  CLINICAL DATA:  Dry cough for 2 weeks.  COPD. EXAM: CHEST  2 VIEW COMPARISON:  12/10/2013 FINDINGS: Levoconvex thoracic scoliosis. Mild enlargement of the cardiopericardial silhouette with faint Kerley B-lines bilaterally. No airspace opacity observed. No pleural effusion identified. Flattened hemidiaphragms. Bony demineralization. IMPRESSION: 1. Mild cardiomegaly with faint Kerley B-lines suggesting subtle interstitial edema. No airspace opacity identified. 2. Levoconvex thoracic scoliosis. 3. Bony demineralization. Electronically Signed   By: Gaylyn RongWalter  Liebkemann M.D.   On: 10/29/2015 22:48   Ct Abdomen Pelvis W Contrast  10/27/2015  CLINICAL DATA:  Left lower quadrant pain for 2 days. History of C difficile colitis. EXAM: CT ABDOMEN AND PELVIS WITH CONTRAST TECHNIQUE: Multidetector CT imaging of the abdomen and pelvis was performed using the standard protocol following bolus administration  of intravenous contrast. CONTRAST:  100mL OMNIPAQUE IOHEXOL 300 MG/ML  SOLN COMPARISON:  12/13/2013 FINDINGS: There is mild cardiomegaly.  Lung bases are clear.  No effusions. Prior cholecystectomy. Liver, spleen, pancreas, adrenals are unremarkable. Left kidney is mildly atrophic. 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 Benign appearing cyst off the lower pole the right kidney. No hydronephrosis. There is sigmoid diverticulosis. No active diverticulitis. Stomach and small bowel are decompressed and unremarkable. Prior hysterectomy. No adnexal masses. Urinary bladder is unremarkable. No free fluid, free air or adenopathy. Small umbilical hernia containing fat. No acute bony abnormality or focal bone lesion. Scoliosis and degenerative changes in the lumbar spine. IMPRESSION: Mild cardiomegaly. Sigmoid diverticulosis.  No active diverticulitis. No acute findings in the abdomen or pelvis. Electronically Signed   By: Charlett NoseKevin  Dover M.D.   On: 10/27/2015 12:08    Microbiology: Recent Results (from the past 240 hour(s))  C difficile quick scan w PCR reflex     Status: None   Collection Time: 10/30/15  8:18 PM  Result Value Ref Range Status  C Diff antigen NEGATIVE NEGATIVE Final   C Diff toxin NEGATIVE NEGATIVE Final   C Diff interpretation Negative for toxigenic C. difficile  Final     Labs: Basic Metabolic Panel:  Recent Labs Lab 10/27/15 1013 10/28/15 0842 10/29/15 1936 10/30/15 0525  NA 139 140 140 143  K 4.1 4.5 4.1 3.9  CL 104 103 106 113*  CO2 GLUCOSE 99 105* 99 94  BUN 22* 27* 33* 29*  CREATININE 0.84 0.97 1.63* 1.42*  CALCIUM 9.6 9.7 10.1 9.1  MG  --   --   --  2.0  PHOS  --   --   --  4.3   Liver Function Tests:  Recent Labs Lab 10/27/15 1013 10/28/15 0842 10/30/15 0525  AST ALT 13* 13* 12*  ALKPHOS 66 66 51  BILITOT 0.7 0.9 0.8  PROT 6.9 6.7 5.6*   ALBUMIN 4.3 4.1 3.5    Recent Labs Lab 10/27/15 1013  LIPASE 39   No results for input(s): AMMONIA in the last 168 hours. CBC:  Recent Labs Lab 10/27/15 1013 10/28/15 0842 10/29/15 1936 10/30/15 0525  WBC 8.3 9.6 11.6* 8.3  NEUTROABS 5.0 6.8 7.9*  --   HGB 12.8 12.9 12.8 11.4*  HCT 39.7 39.4 39.1 35.1*  MCV 99.5 98.0 98.7 98.6  PLT 229 231 234 201   Cardiac Enzymes: No results for input(s): CKTOTAL, CKMB, CKMBINDEX, TROPONINI in the last 168 hours. BNP: BNP (last 3 results) No results for input(s): BNP in the last 8760 hours.  ProBNP (last 3 results) No results for input(s): PROBNP in the last 8760 hours.  CBG: No results for input(s): GLUCAP in the last 168 hours.     Signed:  Penny Pia  [facility] 10/31/2015, 11:57 AM

## 2015-10-31 NOTE — Care Management Note (Signed)
Case Management Note  Patient Details  Name: Theresia Loatricia D Briere MRN: [id number] Date of Birth: 11-Sep-1930  Subjective/Objective:  81 y/o f admitted w/HTN. From home.                  Action/Plan:d/c home no needs or orders.   Expected Discharge Date:   (unknown)               Expected Discharge Plan:  Home/Self Care  In-House Referral:     Discharge planning Services  CM Consult  Post Acute Care Choice:    Choice offered to:     DME Arranged:    DME Agency:     HH Arranged:    HH Agency:     Status of Service:  Completed, signed off  Medicare Important Message Given:    Date Medicare IM Given:    Medicare IM give by:    Date Additional Medicare IM Given:    Additional Medicare Important Message give by:     If discussed at Long Length of Stay Meetings, dates discussed:    Additional Comments:  Lanier ClamMahabir, Brayant Dorr, RN 10/31/2015, 12:19 PM

## 2015-10-31 NOTE — Progress Notes (Signed)
Discharge instructions given to pt, verbalized understanding. Left the unit in stable condition. 

## 2015-11-01 LAB — FECAL LACTOFERRIN, QUANT: Fecal Lactoferrin: NEGATIVE

## 2015-11-03 ENCOUNTER — Other Ambulatory Visit: Payer: Self-pay | Admitting: Family Medicine

## 2015-11-03 ENCOUNTER — Ambulatory Visit
Admission: RE | Admit: 2015-11-03 | Discharge: 2015-11-03 | Disposition: A | Discharge status: Home / Self Care | Payer: Medicare Other | Source: Ambulatory Visit | Attending: Family Medicine | Admitting: Family Medicine

## 2015-11-03 DIAGNOSIS — R0600 Dyspnea, unspecified: Secondary | ICD-10-CM

## 2015-11-03 LAB — STOOL CULTURE

## 2015-11-04 ENCOUNTER — Emergency Department (EMERGENCY_DEPARTMENT_HOSPITAL): Payer: Medicare Other

## 2015-11-04 ENCOUNTER — Encounter (HOSPITAL_COMMUNITY): Payer: Self-pay | Admitting: Emergency Medicine

## 2015-11-04 ENCOUNTER — Emergency Department (HOSPITAL_COMMUNITY): Payer: Medicare Other

## 2015-11-04 ENCOUNTER — Emergency Department (HOSPITAL_COMMUNITY)
Admission: EM | Admit: 2015-11-04 | Discharge: 2015-11-04 | Disposition: A | Discharge status: Home / Self Care | Payer: Medicare Other | Attending: Emergency Medicine | Admitting: Emergency Medicine

## 2015-11-04 DIAGNOSIS — Z8619 Personal history of other infectious and parasitic diseases: Secondary | ICD-10-CM | POA: Diagnosis not present

## 2015-11-04 DIAGNOSIS — L03113 Cellulitis of right upper limb: Secondary | ICD-10-CM | POA: Insufficient documentation

## 2015-11-04 DIAGNOSIS — M1990 Unspecified osteoarthritis, unspecified site: Secondary | ICD-10-CM | POA: Insufficient documentation

## 2015-11-04 DIAGNOSIS — Z792 Long term (current) use of antibiotics: Secondary | ICD-10-CM | POA: Diagnosis not present

## 2015-11-04 DIAGNOSIS — I739 Peripheral vascular disease, unspecified: Secondary | ICD-10-CM | POA: Insufficient documentation

## 2015-11-04 DIAGNOSIS — Z7982 Long term (current) use of aspirin: Secondary | ICD-10-CM | POA: Insufficient documentation

## 2015-11-04 DIAGNOSIS — E785 Hyperlipidemia, unspecified: Secondary | ICD-10-CM | POA: Diagnosis not present

## 2015-11-04 DIAGNOSIS — Z8719 Personal history of other diseases of the digestive system: Secondary | ICD-10-CM | POA: Insufficient documentation

## 2015-11-04 DIAGNOSIS — M79643 Pain in unspecified hand: Secondary | ICD-10-CM

## 2015-11-04 DIAGNOSIS — M7989 Other specified soft tissue disorders: Secondary | ICD-10-CM | POA: Diagnosis present

## 2015-11-04 DIAGNOSIS — J449 Chronic obstructive pulmonary disease, unspecified: Secondary | ICD-10-CM | POA: Insufficient documentation

## 2015-11-04 DIAGNOSIS — I10 Essential (primary) hypertension: Secondary | ICD-10-CM | POA: Insufficient documentation

## 2015-11-04 DIAGNOSIS — Z79899 Other long term (current) drug therapy: Secondary | ICD-10-CM | POA: Diagnosis not present

## 2015-11-04 LAB — COMPREHENSIVE METABOLIC PANEL
ALT: 11 U/L — ABNORMAL LOW (ref 14–54)
AST: 14 U/L — ABNORMAL LOW (ref 15–41)
Albumin: 3.6 g/dL (ref 3.5–5.0)
Alkaline Phosphatase: 57 U/L (ref 38–126)
Anion gap: 6 (ref 5–15)
BUN: 21 mg/dL — ABNORMAL HIGH (ref 6–20)
CO2: 29 mmol/L (ref 22–32)
Calcium: 8.8 mg/dL — ABNORMAL LOW (ref 8.9–10.3)
Chloride: 105 mmol/L (ref 101–111)
Creatinine, Ser: 0.74 mg/dL (ref 0.44–1.00)
GFR calc Af Amer: >60 mL/min (ref >60)
GFR calc non Af Amer: >60 mL/min (ref >60)
Glucose, Bld: 105 mg/dL — ABNORMAL HIGH (ref 65–99)
Potassium: 3.4 mmol/L — ABNORMAL LOW (ref 3.5–5.1)
Sodium: 140 mmol/L (ref 135–145)
Total Bilirubin: 0.8 mg/dL (ref 0.3–1.2)
Total Protein: 6.4 g/dL — ABNORMAL LOW (ref 6.5–8.1)

## 2015-11-04 LAB — CBC WITH DIFFERENTIAL/PLATELET
Basophils Absolute: 0 10*3/uL (ref 0.0–0.1)
Basophils Relative: 0 %
Eosinophils Absolute: 0.2 10*3/uL (ref 0.0–0.7)
Eosinophils Relative: 2 %
HCT: 34.3 % — ABNORMAL LOW (ref 36.0–46.0)
Hemoglobin: 11.3 g/dL — ABNORMAL LOW (ref 12.0–15.0)
Lymphocytes Relative: 19 %
Lymphs Abs: 2.2 10*3/uL (ref 0.7–4.0)
MCH: 32.3 pg (ref 26.0–34.0)
MCHC: 32.9 g/dL (ref 30.0–36.0)
MCV: 98 fL (ref 78.0–100.0)
Monocytes Absolute: 1 10*3/uL (ref 0.1–1.0)
Monocytes Relative: 9 %
Neutro Abs: 8 10*3/uL — ABNORMAL HIGH (ref 1.7–7.7)
Neutrophils Relative %: 70 %
Platelets: 211 10*3/uL (ref 150–400)
RBC: 3.5 MIL/uL — ABNORMAL LOW (ref 3.87–5.11)
RDW: 13.1 % (ref 11.5–15.5)
WBC: 11.4 10*3/uL — ABNORMAL HIGH (ref 4.0–10.5)

## 2015-11-04 LAB — I-STAT TROPONIN, ED: Troponin i, poc: 0 ng/mL (ref 0.00–0.08)

## 2015-11-04 LAB — BRAIN NATRIURETIC PEPTIDE: B Natriuretic Peptide: 307.7 pg/mL — ABNORMAL HIGH (ref 0.0–100.0)

## 2015-11-04 MED ORDER — SULFAMETHOXAZOLE-TRIMETHOPRIM 800-160 MG PO TABS: 1.0000 | ORAL_TABLET | Freq: Two times a day (BID) | ORAL | Status: AC

## 2015-11-04 MED ORDER — VANCOMYCIN HCL IN DEXTROSE 1-5 GM/200ML-% IV SOLN: 1000.0000 mg | INTRAVENOUS | Status: DC

## 2015-11-04 MED ORDER — VANCOMYCIN HCL IN DEXTROSE 1-5 GM/200ML-% IV SOLN
1000.0000 mg | INTRAVENOUS | Status: AC
  Administered 2015-11-04: 1000 mg via INTRAVENOUS
  Filled 2015-11-04 (×2): qty 200

## 2015-11-04 NOTE — Progress Notes (Signed)
VASCULAR LAB PRELIMINARY  PRELIMINARY  PRELIMINARY  PRELIMINARY  Right upper extremity venous duplex completed.    Preliminary report:  Right:  No evidence of DVT or superficial thrombosis.    Sydney Gross, RVT 11/04/2015, 8:58 PM

## 2015-11-04 NOTE — Progress Notes (Signed)
ANTIBIOTIC CONSULT NOTE - INITIAL  Pharmacy Consult for Vancomycin Indication: Cellulitis  Allergies  Allergen Reactions  . Codeine     GI upset  . Crestor [Rosuvastatin] Other (See Comments)    MYALGIAS  . Darvon [Propoxyphene] Nausea Only  . Lipitor [Atorvastatin] Other (See Comments)    MYALGIAS  . Zocor [Simvastatin] Other (See Comments)    MYALGIAS    Patient Measurements:     Vital Signs: Temp: 98.1 F (36.7 C) (11/29 1741) Temp Source: Oral (11/29 1741) BP: 125/56 mmHg (11/29 1741) Pulse Rate: 99 (11/29 1741) Intake/Output from previous day:   Intake/Output from this shift:    Labs:  Recent Labs  11/04/15 1943  WBC 11.4*  HGB 11.3*  PLT 211  CREATININE 0.74   Estimated Creatinine Clearance: 46 mL/min (by C-G formula based on Cr of 0.74). No results for input(s): VANCOTROUGH, VANCOPEAK, VANCORANDOM, GENTTROUGH, GENTPEAK, GENTRANDOM, TOBRATROUGH, TOBRAPEAK, TOBRARND, AMIKACINPEAK, AMIKACINTROU, AMIKACIN in the last 72 hours.   Microbiology: Recent Results (from the past 720 hour(s))  C difficile quick scan w PCR reflex     Status: None   Collection Time: 10/30/15  8:18 PM  Result Value Ref Range Status   C Diff antigen NEGATIVE NEGATIVE Final   C Diff toxin NEGATIVE NEGATIVE Final   C Diff interpretation Negative for toxigenic C. difficile  Final  Stool culture     Status: None   Collection Time: 10/31/15  7:20 AM  Result Value Ref Range Status   Specimen Description STOOL  Final   Special Requests NONE  Final   Culture   Final    NO SALMONELLA, SHIGELLA, CAMPYLOBACTER, YERSINIA, OR E.COLI [id number] ISOLATED Note: REDUCED NORMAL FLORA PRESENT Performed at [facility]    Report Status 11/03/2015 FINAL  Final    Medical History: Past Medical History  Diagnosis Date  . Hyperlipidemia   . Hypertension   . Colitis 12/2013    "never had this before"  . Family history of anesthesia complication     "mother; think they gave her too much;  c/o pain after OR; gave her more RX; they had to bring her back real quick"  . COPD (chronic obstructive pulmonary disease) (HCC)   . Exertional shortness of breath     "off and on" (12/10/2013)  . Arthritis     "maybe a little in my knees and back" (12/10/2013)  . Hiatal hernia   . Stenosis of tear duct   . Shingles   . H/O calcium pyrophosphate deposition disease (CPPD)   . DOE (dyspnea on exertion)   . PAD (peripheral artery disease) (HCC)   . Allergic rhinitis   . Proteinuria     Medications:  Scheduled:   Infusions:  . vancomycin     Assessment:  81 yr female with c/o right hand swelling.  Received a shot for gout yesterday for swelling in knees  Pharmacy consulted to dose Vancomycin for cellulitis  CrCl ~ 46 ml/min  Goal of Therapy:  Vancomycin trough level 10-15 mcg/ml  Plan:  Measure antibiotic drug levels at steady state  Vancomycin 1gm IV q24h  Sheetal Lyall, Joselyn GlassmanLeann Trefz, PharmD 11/04/2015,8:37 PM

## 2015-11-04 NOTE — ED Notes (Signed)
Per pt/family-right hand swelling since yesterday-radiating up arm states she had swelling in both knees as well-was giving shot for possible gout yesterday

## 2015-11-04 NOTE — ED Provider Notes (Signed)
CSN: [id number]     Arrival date & time 11/04/15  1729 History  By signing my name below, I, Gonzella LexKimberly Bianca Gray, attest that this documentation has been prepared under the direction and in the presence of Roxy Horsemanobert Joachim Carton, PA-C. Electronically Signed: Gonzella LexKimberly Bianca Gray, Scribe. 11/04/2015. 7:23 PM.    Chief Complaint  Patient presents with  . hand swollen     The history is provided by the patient. No language interpreter was used.    HPI Comments: Sydney Gross is a 81 y.o. female who presents to the Emergency Department complaining of edema of her right hand with radiation into her right shoulder, legs, and abdomen onset yesterday. Pt was in the ER 8 days ago with back muscle spasms but was just treated with antibiotics for her UTI. Pt reports that she was unable to ambulate the next day due to her muscle spasms so returned to the ER where she was then administered valium in the ER. She reports the valium was helping until she then began suffering from chronic diarrhea. She then came back to the ER and was admitted and treated for C. Diff until four days go. Pt reports she has been taking half a tablet of valium during the day and a whole tablet at night to help her sleep.  Past Medical History  Diagnosis Date  . Hyperlipidemia   . Hypertension   . Colitis 12/2013    "never had this before"  . Family history of anesthesia complication     "mother; think they gave her too much; c/o pain after OR; gave her more RX; they had to bring her back real quick"  . COPD (chronic obstructive pulmonary disease) (HCC)   . Exertional shortness of breath     "off and on" (12/10/2013)  . Arthritis     "maybe a little in my knees and back" (12/10/2013)  . Hiatal hernia   . Stenosis of tear duct   . Shingles   . H/O calcium pyrophosphate deposition disease (CPPD)   . DOE (dyspnea on exertion)   . PAD (peripheral artery disease) (HCC)   . Allergic rhinitis   . Proteinuria    Past Surgical  History  Procedure Laterality Date  . Tonsillectomy and adenoidectomy  ?1940's  . Cholecystectomy  ~ 2009  . Back surgery    . Lumbar disc surgery  ?2004  . Abdominal hysterectomy  1960's  . Cesarean section  [id number]; 1960   Family History  Problem Relation Age of Onset  . Emphysema Father 2069  . CVA Mother   . Cancer Brother     ? type   Social History  Substance Use Topics  . Smoking status: Never Smoker   . Smokeless tobacco: Never Used  . Alcohol Use: 2.4 oz/week    4 Glasses of wine per week     Comment: glass of wine every other night   OB History    No data available     Review of Systems  Constitutional: Negative for fever and chills.  Respiratory: Negative for shortness of breath.   Cardiovascular: Negative for chest pain.  Gastrointestinal: Negative for nausea, vomiting, diarrhea and constipation.  Genitourinary: Negative for dysuria.  Musculoskeletal: Positive for joint swelling and arthralgias.  All other systems reviewed and are negative.     Allergies  Codeine; Crestor; Darvon; Lipitor; and Zocor  Home Medications   Prior to Admission medications   Medication Sig Start Date End Date Taking? Authorizing Provider  acetaminophen (TYLENOL) 500 MG tablet Take 500 mg by mouth every 6 (six) hours as needed for moderate pain.    Historical Provider, MD  amLODipine (NORVASC) 5 MG tablet Take 1 tablet (5 mg total) by mouth daily. 10/31/15   Penny Pia, MD  Ascorbic Acid (VITAMIN C PO) Take 1 tablet by mouth daily.    Historical Provider, MD  aspirin EC 81 MG tablet Take 81 mg by mouth every evening.     Historical Provider, MD  budesonide-formoterol (SYMBICORT) 160-4.5 MCG/ACT inhaler Inhale 2 puffs into the lungs 2 (two) times daily.    Historical Provider, MD  cephALEXin (KEFLEX) 250 MG capsule Take 1 capsule (250 mg total) by mouth 2 (two) times daily. 10/31/15   Penny Pia, MD  Cholecalciferol (VITAMIN D PO) Take 1 capsule by mouth every evening.     Historical Provider, MD  cilostazol (PLETAL) 50 MG tablet Take 50 mg by mouth 2 (two) times daily.  11/30/14   Historical Provider, MD  diazepam (VALIUM) 5 MG tablet Take 0.5 tablets (2.5 mg total) by mouth every 6 (six) hours as needed for muscle spasms. 10/28/15   Courteney Lyn Mackuen, MD  diphenhydramine-acetaminophen (TYLENOL PM) 25-500 MG TABS Take 1 tablet by mouth at bedtime as needed (pain/sleep).     Historical Provider, MD  ezetimibe (ZETIA) 10 MG tablet Take 10 mg by mouth every evening.    Historical Provider, MD  HYDROcodone-acetaminophen (NORCO/VICODIN) 5-325 MG tablet Take 1 tablet by mouth every 4 (four) hours as needed. Patient not taking: Reported on 10/28/2015 10/27/15   Barrett Henle, PA-C  ondansetron (ZOFRAN) 4 MG tablet Take 1 tablet (4 mg total) by mouth every 8 (eight) hours as needed for nausea or vomiting. 10/28/15   Courteney Lyn Mackuen, MD   BP 125/56 mmHg  Pulse 99  Temp(Src) 98.1 F (36.7 C) (Oral)  Resp 16  SpO2 94% Physical Exam  Constitutional: She is oriented to person, place, and time. She appears well-developed and well-nourished. No distress.  HENT:  Head: Normocephalic and atraumatic.  Eyes: Conjunctivae and EOM are normal. Pupils are equal, round, and reactive to light.  Neck: Normal range of motion. Neck supple.  Cardiovascular: Normal rate, regular rhythm and intact distal pulses.  Exam reveals no gallop and no friction rub.   No murmur heard. Intact distal pulses with brisk cap refill  Pulmonary/Chest: Effort normal and breath sounds normal. No respiratory distress. She has no wheezes. She has no rales. She exhibits no tenderness.  Abdominal: Soft. Bowel sounds are normal. She exhibits no distension and no mass. There is no tenderness. There is no rebound and no guarding.  Musculoskeletal: Normal range of motion. She exhibits no edema or tenderness.  Moderate swelling or right hand, no bony abnormality or deformity, ROM and strength  intact, doubt septic arthritis  Neurological: She is alert and oriented to person, place, and time.  Sensation intact  Skin: Skin is warm and dry.  Mildly erythematous, possible cellulitis, no abscess  Psychiatric: She has a normal mood and affect. Her behavior is normal. Judgment and thought content normal.  Nursing note and vitals reviewed.   ED Course  Procedures  DIAGNOSTIC STUDIES:    Oxygen Saturation is 94% on RA, adequate by my interpretation.   COORDINATION OF CARE:      Labs Review Labs Reviewed - No data to display  Imaging Review Dg Chest 2 View  11/03/2015  CLINICAL DATA:  Shortness of breath. EXAM: CHEST  2 VIEW  COMPARISON:  10/29/2015 .  12/10/2013.  New FINDINGS: In Hilar structures are normal. Right base subsegmental atelectasis and/or pleural parenchymal scarring. Left base subsegmental atelectasis and/or pleural parenchymal scarring. Cardiomegaly with normal pulmonary vascularity. Thoracic spine scoliosis. IMPRESSION: Bibasilar subsegmental atelectasis and/or pleural parenchymal scarring. No acute infiltrate. Electronically Signed   By: Maisie Fus  Register   On: 11/03/2015 15:06   Dg Hand Complete Right  11/04/2015  CLINICAL DATA:  Right hand swelling for 1 day EXAM: RIGHT HAND - COMPLETE 3+ VIEW COMPARISON:  None. FINDINGS: No acute fracture or dislocation. Chronic bony density dorsal to the DIP joint of the index finger. Moderate degenerative changes at the base of the first metacarpal. Lytic lesion at the base of the third metacarpal has a sclerotic border. This has a nonaggressive appearance and is likely a benign entity. Mild osteopenia. IMPRESSION: No acute bony pathology. There is a lytic lesion at the base of the third metacarpal which has a nonaggressive appearance. If this is in the location of the patient's pain, bone scan can be performed to further characterize. Electronically Signed   By: Jolaine Click M.D.   On: 11/04/2015 18:19   I have personally  reviewed and evaluated these images and lab results as part of my medical decision-making.   EKG Interpretation None      MDM   Final diagnoses:  Hand pain  Cellulitis of right upper extremity    Patient with swelling and pain of right hand.  No moi.  Patient seen by PCP today, but had worsening of symptoms after leaving and came to the ED.  Will check labs.  Recent admission for AKI and diarrhea.  Possible renal insufficiency vs cellulitis vs DVT.  Patient seen by and discussed with Dr. Clayborne Dana.  Recommends treating for cellulitis with vanc.  Recommends checking DVT study if possible.    Patient signed out to Manus Rudd, NP.  Plan:  Follow-up on labs and DVT study.  If labs are normal and DVT study unable to be obtained give lovenox and get DVT study in AM.  DC with abx.  I personally performed the services described in this documentation, which was scribed in my presence. The recorded information has been reviewed and is accurate.      Roxy Horseman, PA-C 11/05/15 1114  Marily Memos, MD 11/05/15 1230

## 2015-11-04 NOTE — Discharge Instructions (Signed)
Cellulitis Cellulitis is an infection of the skin and the tissue under the skin. The infected area is usually red and tender. This happens most often in the arms and lower legs. HOME CARE   Take your antibiotic medicine as told. Finish the medicine even if you start to feel better.  Keep the infected arm or leg raised (elevated).  Put a warm cloth on the area up to 4 times per day.  Only take medicines as told by your doctor.  Keep all doctor visits as told. GET HELP IF:  You see red streaks on the skin coming from the infected area.  Your red area gets bigger or turns a dark color.  Your bone or joint under the infected area is painful after the skin heals.  Your infection comes back in the same area or different area.  You have a puffy (swollen) bump in the infected area.  You have new symptoms.  You have a fever. GET HELP RIGHT AWAY IF:   You feel very sleepy.  You throw up (vomit) or have watery poop (diarrhea).  You feel sick and have muscle aches and pains.   This information is not intended to replace advice given to you by your health care provider. Make sure you discuss any questions you have with your health care provider.   Document Released: 05/10/2008 Document Revised: 08/13/2015 Document Reviewed: 02/07/2012 Elsevier Interactive Patient Education [employer] Your ultrasound tonight is negative for a blood clot You are being treated for a skin infection Please continue taking the Keflex and I have added Bactrim  Please take this ans well until all tablets have been taken  Please call your PCP and set an appointment in the next 1-2 days for reevaluation.  If the swelling and redness get worse please return

## 2015-11-04 NOTE — ED Provider Notes (Signed)
Ultrasound reviewed negative for DVT  Vancomycin infusion completed and will DC home with Rx for Bactrim as well as Keflex  To make appointment with PCP to be reevaluated in next 1-2 days given return parameters   Earley FavorGail Jakaila Norment, NP 11/04/15 2209  Marily MemosJason Mesner, MD 11/04/15 2355

## 2015-11-04 NOTE — ED Provider Notes (Signed)
Medical screening examination/treatment/procedure(s) were conducted as a shared visit with non-physician practitioner(s) and myself.  I personally evaluated the patient during the encounter.  Erythema and swelling over her right wrist. No pain with range of motion of wrist. She has normal pulses. Edema in her dorsal hand as well. Plan is to treat for cellulitis and get an ultrasound. Ultrasound then we will also treat for DVT and get an ultrasound in the morning. If ultrasound is negative she will likely need a prescription for Bactrim in addition to her Keflex. She will need to follow with her primary doctor closely to ensure improvement.   Marily MemosJason Mayara Paulson, MD 11/04/15 [phone]

## 2015-11-25 ENCOUNTER — Ambulatory Visit
Admission: RE | Admit: 2015-11-25 | Discharge: 2015-11-25 | Disposition: A | Discharge status: Home / Self Care | Payer: Medicare Other | Source: Ambulatory Visit | Attending: Family Medicine | Admitting: Family Medicine

## 2015-11-25 ENCOUNTER — Other Ambulatory Visit: Payer: Self-pay | Admitting: Family Medicine

## 2015-11-25 DIAGNOSIS — M25531 Pain in right wrist: Secondary | ICD-10-CM

## 2015-11-30 ENCOUNTER — Other Ambulatory Visit: Payer: Self-pay | Admitting: Interventional Cardiology

## 2015-12-10 ENCOUNTER — Telehealth: Payer: Self-pay | Admitting: Interventional Cardiology

## 2015-12-10 NOTE — Telephone Encounter (Signed)
Called pt to verify that she d/c Metoprolol, lmtcb

## 2015-12-10 NOTE — Telephone Encounter (Signed)
After receiving a refill for Metoprolol called pt to verify if she is still taking. Pt was instructed to d/c Metoprolol because of bradycardia when she was d/c from [facility] 10/31/15. Pt sts that she resumed Metoprolol after her discharge from the hospital and has been taking Metoprolol 25mg  bid. Pt sts that she feel ok, has no complaints. Pt is unable to provide any VS. She has not measured her bp or heartrate since returning home. Pt sts that her supply is down to 5 days. Adv her that I will need to fwd an update to Dr.Smith for his approval to renew the Rx. Adv pt I will call back with Dr.Smith's response. Pt verbalized understanding.

## 2015-12-10 NOTE — Telephone Encounter (Signed)
Pt calling requesting a refill on Metoprolol 25 mg tablet. Medication is not on pt's med list. Medication was D/C on 10/31/15. Please advise

## 2015-12-15 MED ORDER — METOPROLOL TARTRATE 25 MG PO TABS: 12.5000 mg | ORAL_TABLET | Freq: Two times a day (BID) | ORAL | Status: DC

## 2015-12-15 NOTE — Telephone Encounter (Signed)
Yes,  refill 

## 2015-12-15 NOTE — Telephone Encounter (Signed)
Pt aware of Dr.Smith's response for her to continue Metoprolol 12.5mg  bid. Rx sent to pt pharmacy. F/u appt with Dr.Smith scheduled for 2/8 @10 :15am. Pt voiced appreciation and verbalized understanding.

## 2015-12-15 NOTE — Addendum Note (Signed)
Addended by: Jarvis NewcomerPARRIS-GODLEY, LISA S on: 12/15/2015 11:35 AM   Modules accepted: Orders

## 2016-01-06 ENCOUNTER — Other Ambulatory Visit: Payer: Self-pay | Admitting: Interventional Cardiology

## 2016-01-06 ENCOUNTER — Ambulatory Visit (HOSPITAL_COMMUNITY): Payer: Medicare Other | Attending: Cardiology

## 2016-01-06 ENCOUNTER — Other Ambulatory Visit: Payer: Self-pay

## 2016-01-06 DIAGNOSIS — I358 Other nonrheumatic aortic valve disorders: Secondary | ICD-10-CM | POA: Insufficient documentation

## 2016-01-06 DIAGNOSIS — I10 Essential (primary) hypertension: Secondary | ICD-10-CM | POA: Insufficient documentation

## 2016-01-06 DIAGNOSIS — I517 Cardiomegaly: Secondary | ICD-10-CM | POA: Insufficient documentation

## 2016-01-06 DIAGNOSIS — I7789 Other specified disorders of arteries and arterioles: Secondary | ICD-10-CM | POA: Diagnosis not present

## 2016-01-06 DIAGNOSIS — I340 Nonrheumatic mitral (valve) insufficiency: Secondary | ICD-10-CM | POA: Insufficient documentation

## 2016-01-06 DIAGNOSIS — I272 Other secondary pulmonary hypertension: Secondary | ICD-10-CM | POA: Diagnosis not present

## 2016-01-06 DIAGNOSIS — I5189 Other ill-defined heart diseases: Secondary | ICD-10-CM | POA: Diagnosis not present

## 2016-01-06 DIAGNOSIS — I351 Nonrheumatic aortic (valve) insufficiency: Secondary | ICD-10-CM | POA: Insufficient documentation

## 2016-01-06 DIAGNOSIS — E785 Hyperlipidemia, unspecified: Secondary | ICD-10-CM | POA: Insufficient documentation

## 2016-01-13 DIAGNOSIS — J342 Deviated nasal septum: Secondary | ICD-10-CM | POA: Diagnosis not present

## 2016-01-13 DIAGNOSIS — Z961 Presence of intraocular lens: Secondary | ICD-10-CM | POA: Diagnosis not present

## 2016-01-13 DIAGNOSIS — Z9841 Cataract extraction status, right eye: Secondary | ICD-10-CM | POA: Diagnosis not present

## 2016-01-13 DIAGNOSIS — H0289 Other specified disorders of eyelid: Secondary | ICD-10-CM | POA: Diagnosis not present

## 2016-01-13 DIAGNOSIS — Z885 Allergy status to narcotic agent status: Secondary | ICD-10-CM | POA: Diagnosis not present

## 2016-01-13 DIAGNOSIS — Z9842 Cataract extraction status, left eye: Secondary | ICD-10-CM | POA: Diagnosis not present

## 2016-01-13 DIAGNOSIS — H04223 Epiphora due to insufficient drainage, bilateral lacrimal glands: Secondary | ICD-10-CM | POA: Diagnosis not present

## 2016-01-13 DIAGNOSIS — H04543 Stenosis of bilateral lacrimal canaliculi: Secondary | ICD-10-CM | POA: Diagnosis not present

## 2016-01-13 DIAGNOSIS — Z9889 Other specified postprocedural states: Secondary | ICD-10-CM | POA: Diagnosis not present

## 2016-01-14 ENCOUNTER — Encounter: Payer: Self-pay | Admitting: Interventional Cardiology

## 2016-01-14 ENCOUNTER — Ambulatory Visit (INDEPENDENT_AMBULATORY_CARE_PROVIDER_SITE_OTHER): Payer: Medicare Other | Admitting: Interventional Cardiology

## 2016-01-14 VITALS — BP 146/88 | HR 62 | Ht 59.0 in | Wt 123.2 lb

## 2016-01-14 DIAGNOSIS — R0609 Other forms of dyspnea: Secondary | ICD-10-CM | POA: Diagnosis not present

## 2016-01-14 DIAGNOSIS — I7789 Other specified disorders of arteries and arterioles: Secondary | ICD-10-CM | POA: Diagnosis not present

## 2016-01-14 DIAGNOSIS — R0600 Dyspnea, unspecified: Secondary | ICD-10-CM

## 2016-01-14 DIAGNOSIS — I10 Essential (primary) hypertension: Secondary | ICD-10-CM

## 2016-01-14 DIAGNOSIS — Q253 Supravalvular aortic stenosis: Secondary | ICD-10-CM

## 2016-01-14 NOTE — Progress Notes (Signed)
Cardiology Office Note   Date:  01/14/2016   ID:  Sydney Gross, DOB [birth year], MRN [id number]  PCP:  Lupita Raider, MD  Cardiologist:  Lesleigh Noe, MD   Chief Complaint  Patient presents with  . Shortness of Breath      History of Present Illness: Sydney Gross is a 82 y.o. female who presents for  Supravalvular aortic stenosis, hypertension, diastolic heart failure (stable) , an chronic dyspnea.   Overall doing well. Still has dyspnea on exertion. Is worse now than it was a year ago although better now than 6 months ago. She denies chest discomfort. She has not had syncope. Overall she feels things are stable.    Past Medical History  Diagnosis Date  . Hyperlipidemia   . Hypertension   . Colitis 12/2013    "never had this before"  . Family history of anesthesia complication     "mother; think they gave her too much; c/o pain after OR; gave her more RX; they had to bring her back real quick"  . COPD (chronic obstructive pulmonary disease) (HCC)   . Exertional shortness of breath     "off and on" (12/10/2013)  . Arthritis     "maybe a little in my knees and back" (12/10/2013)  . Hiatal hernia   . Stenosis of tear duct   . Shingles   . H/O calcium pyrophosphate deposition disease (CPPD)   . DOE (dyspnea on exertion)   . PAD (peripheral artery disease) (HCC)   . Allergic rhinitis   . Proteinuria     Past Surgical History  Procedure Laterality Date  . Tonsillectomy and adenoidectomy  ?1940's  . Cholecystectomy  ~ 2009  . Back surgery    . Lumbar disc surgery  ?2004  . Abdominal hysterectomy  1960's  . Cesarean section  1324; 1960     Current Outpatient Prescriptions  Medication Sig Dispense Refill  . acetaminophen (TYLENOL) 500 MG tablet Take 500 mg by mouth every 6 (six) hours as needed for moderate pain.    [name] amLODipine (NORVASC) 5 MG tablet Take 1 tablet (5 mg total) by mouth daily. 30 tablet 0  . aspirin EC 81 MG tablet Take 81 mg by mouth  every evening.     . budesonide-formoterol (SYMBICORT) 160-4.5 MCG/ACT inhaler Inhale 2 puffs into the lungs 2 (two) times daily.    . Cholecalciferol (VITAMIN D PO) Take 1 capsule by mouth every evening.    . cilostazol (PLETAL) 50 MG tablet Take 50 mg by mouth 2 (two) times daily.   6  . diazepam (VALIUM) 5 MG tablet Take 0.5 tablets (2.5 mg total) by mouth every 6 (six) hours as needed for muscle spasms. 10 tablet 0  . diphenhydramine-acetaminophen (TYLENOL PM) 25-500 MG TABS Take 1 tablet by mouth at bedtime as needed (pain/sleep).     [name] lisinopril-hydrochlorothiazide (PRINZIDE,ZESTORETIC) 20-25 MG tablet Take half (1/2) tablet by mouth daily every evening.    . metoprolol tartrate (LOPRESSOR) 25 MG tablet Take 0.5 tablets (12.5 mg total) by mouth 2 (two) times daily. 30 tablet 5  . ondansetron (ZOFRAN) 4 MG tablet Take 1 tablet (4 mg total) by mouth every 8 (eight) hours as needed for nausea or vomiting. 11 tablet 0   No current facility-administered medications for this visit.    Allergies:   Codeine; Crestor; Darvon; Lipitor; and Zocor    Social History:  The patient  reports that she has never smoked. She has never  used smokeless tobacco. She reports that she drinks about 2.4 oz of alcohol per week. She reports that she does not use illicit drugs.   Family History:  The patient's family history includes CVA in her mother; Cancer in her brother; Emphysema (age of onset: 60) in her father.    ROS:  Please see the history of present illness.   Otherwise, review of systems are positive for  Upcoming ophthalmologic surgery,, vision disturbance, and back discomfort. .   All other systems are reviewed and negative.    PHYSICAL EXAM: VS:  BP 146/88 mmHg  Pulse 62  Ht  (1.499 m)  Wt 123 lb 3.2 oz (55.883 kg)  BMI 24.87 kg/m2 , BMI Body mass index is 24.87 kg/(m^2). GEN: Well nourished, well developed, in no acute distress HEENT: normal Neck: no JVD, carotid bruits, or  masses Cardiac: R RR.  There  is no murmur, rub, or gallop. There is  no edema. Respiratory:  clear to auscultation bilaterally, normal work of breathing. GI: soft, nontender, nondistended, + BS MS: no deformity or atrophy Skin: warm and dry, no rash Neuro:  Strength and sensation are intact Psych: euthymic mood, full affect   EKG:  EKG  He has not ordered today.    Recent Labs: 10/30/2015: Magnesium 2.0; TSH 1.371 11/04/2015: ALT 11*; B Natriuretic Peptide 307.7*; BUN 21*; Creatinine, Ser 0.74; Hemoglobin 11.3*; Platelets 211; Potassium 3.4*; Sodium 140    Lipid Panel No results found for: CHOL, TRIG, HDL, CHOLHDL, VLDL, LDLCALC, LDLDIRECT    Wt Readings from Last 3 Encounters:  01/14/16 123 lb 3.2 oz (55.883 kg)  10/29/15 126 lb 15.8 oz (57.6 kg)  07/29/15 127 lb (57.607 kg)      Other studies Reviewed: Additional studies/ records that were reviewed today include:  Recent hospitalization for acute kidney injury and UTI.[name] The findings include  reviewed and updated echocardiogram.    ASSESSMENT AND PLAN:  1. Aortic stenosis, supravalvular  mild supravalvular aortic stenosis without significant gradient. Aortic root size is normal beyond the region of obstruction  2. Aortic root enlargement (HCC)  minimal measuring 3.7 cm  3. Essential hypertension  controlled  4. Dyspnea on exertion  stable   5. Bradycardia  6. Pre-operative CV evaluation for Ophthalmologic surgery Clare Charon, MD  Current medicines are reviewed at length with the patient today.  The patient has the following concerns regarding medicines:  none.  The following changes/actions have been instituted:     cleared for upcoming ophthalmologic surgery   We'll follow aortic root size but not very dilated at this point  Labs/ tests ordered today include:  No orders of the defined types were placed in this encounter.     Disposition:   FU with HS in 1 year  Signed, Lesleigh Noe, MD   01/14/2016 10:19 AM    [facility] [address] [place], [place], [place]  [address] Phone: [phone]; Fax: [phone]

## 2016-01-14 NOTE — Patient Instructions (Addendum)
Medication Instructions:  Your physician recommends that you continue on your current medications as directed. Please refer to the Current Medication list given to you today.   Labwork: none  Testing/Procedures: none  Follow-Up: Your physician wants you to follow-up in: 12 months with Dr. Katrinka Blazing. You will receive a reminder letter in the mail two months in advance. If you don't receive a letter, please call our office to schedule the follow-up appointment.   Any Other Special Instructions Will Be Listed Below (If Applicable).  Dr. Katrinka Blazing recommends that you increase your activity to [facility] conditioning.    If you need a refill on your cardiac medications before your next appointment, please call your pharmacy.

## 2016-02-12 DIAGNOSIS — I10 Essential (primary) hypertension: Secondary | ICD-10-CM | POA: Diagnosis not present

## 2016-02-12 DIAGNOSIS — Z7982 Long term (current) use of aspirin: Secondary | ICD-10-CM | POA: Diagnosis not present

## 2016-02-12 DIAGNOSIS — Z888 Allergy status to other drugs, medicaments and biological substances status: Secondary | ICD-10-CM | POA: Diagnosis not present

## 2016-02-12 DIAGNOSIS — H04203 Unspecified epiphora, bilateral lacrimal glands: Secondary | ICD-10-CM | POA: Diagnosis not present

## 2016-02-12 DIAGNOSIS — J343 Hypertrophy of nasal turbinates: Secondary | ICD-10-CM | POA: Diagnosis not present

## 2016-02-12 DIAGNOSIS — H04223 Epiphora due to insufficient drainage, bilateral lacrimal glands: Secondary | ICD-10-CM | POA: Diagnosis not present

## 2016-02-12 DIAGNOSIS — Z9889 Other specified postprocedural states: Secondary | ICD-10-CM | POA: Diagnosis not present

## 2016-02-12 DIAGNOSIS — J342 Deviated nasal septum: Secondary | ICD-10-CM | POA: Diagnosis not present

## 2016-02-12 DIAGNOSIS — H04543 Stenosis of bilateral lacrimal canaliculi: Secondary | ICD-10-CM | POA: Diagnosis not present

## 2016-02-12 DIAGNOSIS — H169 Unspecified keratitis: Secondary | ICD-10-CM | POA: Diagnosis not present

## 2016-02-12 DIAGNOSIS — J449 Chronic obstructive pulmonary disease, unspecified: Secondary | ICD-10-CM | POA: Diagnosis not present

## 2016-02-12 DIAGNOSIS — Z885 Allergy status to narcotic agent status: Secondary | ICD-10-CM | POA: Diagnosis not present

## 2016-02-12 DIAGNOSIS — Z7951 Long term (current) use of inhaled steroids: Secondary | ICD-10-CM | POA: Diagnosis not present

## 2016-02-12 DIAGNOSIS — Z79899 Other long term (current) drug therapy: Secondary | ICD-10-CM | POA: Diagnosis not present

## 2016-02-24 DIAGNOSIS — H04223 Epiphora due to insufficient drainage, bilateral lacrimal glands: Secondary | ICD-10-CM | POA: Diagnosis not present

## 2016-02-24 DIAGNOSIS — Z7982 Long term (current) use of aspirin: Secondary | ICD-10-CM | POA: Diagnosis not present

## 2016-02-24 DIAGNOSIS — H10013 Acute follicular conjunctivitis, bilateral: Secondary | ICD-10-CM | POA: Diagnosis not present

## 2016-02-24 DIAGNOSIS — J449 Chronic obstructive pulmonary disease, unspecified: Secondary | ICD-10-CM | POA: Diagnosis not present

## 2016-03-01 ENCOUNTER — Other Ambulatory Visit: Payer: Self-pay | Admitting: Family Medicine

## 2016-03-01 ENCOUNTER — Ambulatory Visit
Admission: RE | Admit: 2016-03-01 | Discharge: 2016-03-01 | Disposition: A | Discharge status: Home / Self Care | Payer: Medicare Other | Source: Ambulatory Visit | Attending: Family Medicine | Admitting: Family Medicine

## 2016-03-01 DIAGNOSIS — J441 Chronic obstructive pulmonary disease with (acute) exacerbation: Secondary | ICD-10-CM | POA: Diagnosis not present

## 2016-03-01 DIAGNOSIS — R059 Cough, unspecified: Secondary | ICD-10-CM

## 2016-03-01 DIAGNOSIS — R05 Cough: Secondary | ICD-10-CM

## 2016-03-01 DIAGNOSIS — R197 Diarrhea, unspecified: Secondary | ICD-10-CM | POA: Diagnosis not present

## 2016-03-01 DIAGNOSIS — R0602 Shortness of breath: Secondary | ICD-10-CM | POA: Diagnosis not present

## 2016-03-10 DIAGNOSIS — R5383 Other fatigue: Secondary | ICD-10-CM | POA: Diagnosis not present

## 2016-03-10 DIAGNOSIS — R197 Diarrhea, unspecified: Secondary | ICD-10-CM | POA: Diagnosis not present

## 2016-05-14 DIAGNOSIS — I77819 Aortic ectasia, unspecified site: Secondary | ICD-10-CM | POA: Diagnosis not present

## 2016-05-14 DIAGNOSIS — J449 Chronic obstructive pulmonary disease, unspecified: Secondary | ICD-10-CM | POA: Diagnosis not present

## 2016-05-14 DIAGNOSIS — H04543 Stenosis of bilateral lacrimal canaliculi: Secondary | ICD-10-CM | POA: Diagnosis not present

## 2016-05-14 DIAGNOSIS — I272 Other secondary pulmonary hypertension: Secondary | ICD-10-CM | POA: Diagnosis not present

## 2016-05-14 DIAGNOSIS — I2510 Atherosclerotic heart disease of native coronary artery without angina pectoris: Secondary | ICD-10-CM | POA: Diagnosis not present

## 2016-05-14 DIAGNOSIS — I350 Nonrheumatic aortic (valve) stenosis: Secondary | ICD-10-CM | POA: Diagnosis not present

## 2016-05-14 DIAGNOSIS — H04209 Unspecified epiphora, unspecified lacrimal gland: Secondary | ICD-10-CM | POA: Diagnosis not present

## 2016-05-14 DIAGNOSIS — J343 Hypertrophy of nasal turbinates: Secondary | ICD-10-CM | POA: Diagnosis not present

## 2016-05-14 DIAGNOSIS — I493 Ventricular premature depolarization: Secondary | ICD-10-CM | POA: Diagnosis not present

## 2016-05-14 DIAGNOSIS — J342 Deviated nasal septum: Secondary | ICD-10-CM | POA: Diagnosis not present

## 2016-05-14 DIAGNOSIS — I10 Essential (primary) hypertension: Secondary | ICD-10-CM | POA: Diagnosis not present

## 2016-05-20 DIAGNOSIS — Z79899 Other long term (current) drug therapy: Secondary | ICD-10-CM | POA: Diagnosis not present

## 2016-05-20 DIAGNOSIS — Z7951 Long term (current) use of inhaled steroids: Secondary | ICD-10-CM | POA: Diagnosis not present

## 2016-05-20 DIAGNOSIS — Z48810 Encounter for surgical aftercare following surgery on the sense organs: Secondary | ICD-10-CM | POA: Diagnosis not present

## 2016-05-20 DIAGNOSIS — Z7982 Long term (current) use of aspirin: Secondary | ICD-10-CM | POA: Diagnosis not present

## 2016-05-24 DIAGNOSIS — J449 Chronic obstructive pulmonary disease, unspecified: Secondary | ICD-10-CM | POA: Diagnosis not present

## 2016-05-24 DIAGNOSIS — I10 Essential (primary) hypertension: Secondary | ICD-10-CM | POA: Diagnosis not present

## 2016-05-24 DIAGNOSIS — Z885 Allergy status to narcotic agent status: Secondary | ICD-10-CM | POA: Diagnosis not present

## 2016-05-24 DIAGNOSIS — Z9889 Other specified postprocedural states: Secondary | ICD-10-CM | POA: Diagnosis not present

## 2016-05-24 DIAGNOSIS — Z79899 Other long term (current) drug therapy: Secondary | ICD-10-CM | POA: Diagnosis not present

## 2016-05-24 DIAGNOSIS — Z9841 Cataract extraction status, right eye: Secondary | ICD-10-CM | POA: Diagnosis not present

## 2016-05-24 DIAGNOSIS — Z888 Allergy status to other drugs, medicaments and biological substances status: Secondary | ICD-10-CM | POA: Diagnosis not present

## 2016-05-24 DIAGNOSIS — Z9842 Cataract extraction status, left eye: Secondary | ICD-10-CM | POA: Diagnosis not present

## 2016-05-24 DIAGNOSIS — Z48810 Encounter for surgical aftercare following surgery on the sense organs: Secondary | ICD-10-CM | POA: Diagnosis not present

## 2016-06-03 DIAGNOSIS — Z79899 Other long term (current) drug therapy: Secondary | ICD-10-CM | POA: Diagnosis not present

## 2016-06-03 DIAGNOSIS — Z7982 Long term (current) use of aspirin: Secondary | ICD-10-CM | POA: Diagnosis not present

## 2016-06-03 DIAGNOSIS — Z48810 Encounter for surgical aftercare following surgery on the sense organs: Secondary | ICD-10-CM | POA: Diagnosis not present

## 2016-06-10 ENCOUNTER — Other Ambulatory Visit: Payer: Self-pay | Admitting: Interventional Cardiology

## 2016-06-14 DIAGNOSIS — K219 Gastro-esophageal reflux disease without esophagitis: Secondary | ICD-10-CM | POA: Diagnosis not present

## 2016-06-14 DIAGNOSIS — J441 Chronic obstructive pulmonary disease with (acute) exacerbation: Secondary | ICD-10-CM | POA: Diagnosis not present

## 2016-06-14 DIAGNOSIS — Z0000 Encounter for general adult medical examination without abnormal findings: Secondary | ICD-10-CM | POA: Diagnosis not present

## 2016-06-14 DIAGNOSIS — E201 Pseudohypoparathyroidism: Secondary | ICD-10-CM | POA: Diagnosis not present

## 2016-06-14 DIAGNOSIS — J449 Chronic obstructive pulmonary disease, unspecified: Secondary | ICD-10-CM | POA: Diagnosis not present

## 2016-06-14 DIAGNOSIS — E559 Vitamin D deficiency, unspecified: Secondary | ICD-10-CM | POA: Diagnosis not present

## 2016-06-14 DIAGNOSIS — I10 Essential (primary) hypertension: Secondary | ICD-10-CM | POA: Diagnosis not present

## 2016-06-14 DIAGNOSIS — I739 Peripheral vascular disease, unspecified: Secondary | ICD-10-CM | POA: Diagnosis not present

## 2016-06-14 DIAGNOSIS — E782 Mixed hyperlipidemia: Secondary | ICD-10-CM | POA: Diagnosis not present

## 2016-06-14 DIAGNOSIS — I517 Cardiomegaly: Secondary | ICD-10-CM | POA: Diagnosis not present

## 2016-07-29 ENCOUNTER — Encounter (HOSPITAL_COMMUNITY): Payer: Medicare Other

## 2016-07-29 ENCOUNTER — Ambulatory Visit: Payer: Medicare Other | Admitting: Family

## 2016-08-05 ENCOUNTER — Encounter: Payer: Self-pay | Admitting: Family

## 2016-08-06 ENCOUNTER — Ambulatory Visit (HOSPITAL_COMMUNITY)
Admission: RE | Admit: 2016-08-06 | Discharge: 2016-08-06 | Disposition: A | Discharge status: Home / Self Care | Payer: Medicare Other | Source: Ambulatory Visit | Attending: Family | Admitting: Family

## 2016-08-06 ENCOUNTER — Other Ambulatory Visit: Payer: Self-pay | Admitting: Family

## 2016-08-06 ENCOUNTER — Encounter: Payer: Self-pay | Admitting: Family

## 2016-08-06 ENCOUNTER — Ambulatory Visit (INDEPENDENT_AMBULATORY_CARE_PROVIDER_SITE_OTHER): Payer: Medicare Other | Admitting: Family

## 2016-08-06 VITALS — Temp 97.3°F | Resp 16 | Ht 59.0 in | Wt 123.0 lb

## 2016-08-06 DIAGNOSIS — I77810 Thoracic aortic ectasia: Secondary | ICD-10-CM

## 2016-08-06 DIAGNOSIS — I10 Essential (primary) hypertension: Secondary | ICD-10-CM | POA: Diagnosis not present

## 2016-08-06 DIAGNOSIS — Z83438 Family history of other disorder of lipoprotein metabolism and other lipidemia: Secondary | ICD-10-CM

## 2016-08-06 DIAGNOSIS — I70213 Atherosclerosis of native arteries of extremities with intermittent claudication, bilateral legs: Secondary | ICD-10-CM

## 2016-08-06 DIAGNOSIS — I350 Nonrheumatic aortic (valve) stenosis: Secondary | ICD-10-CM

## 2016-08-06 DIAGNOSIS — Z8349 Family history of other endocrine, nutritional and metabolic diseases: Secondary | ICD-10-CM | POA: Diagnosis not present

## 2016-08-06 DIAGNOSIS — I70219 Atherosclerosis of native arteries of extremities with intermittent claudication, unspecified extremity: Secondary | ICD-10-CM | POA: Insufficient documentation

## 2016-08-06 NOTE — Progress Notes (Signed)
[facility] [place]   CC: Follow up peripheral artery occlusive disease  History of Present Illness Sydney Gross is a 82 y.o. female patient of Dr. Imogene Burn who presents with chief complaint: continued intermittent claudication. This patient continues with her extensive exercise regimen with stable calf claudication unchanged. The patient's treatment regimen currently included: maximal medical management and walking plan. After walking about 10 minutes she develops bilateral calf pain which resolves with rest. She walks about 15 minutes most days of the week on a track, also works out in a gym. She denies non healing wounds. Pt denies any history of stroke or TIA, denies any cardiac problems or MI.  Pt has not had previous peripheral vascular intervention .  The patient denies New Medical or Surgical History.  Pt Diabetic: No Pt smoker: non-smoker  Pt meds include: Statin :No, statins cause myalgias Betablocker: Yes ASA: Yes Other anticoagulants/antiplatelets: Pletal    Past Medical History:  Diagnosis Date  . Allergic rhinitis   . Arthritis    "maybe a little in my knees and back" (12/10/2013)  . Colitis 12/2013   "never had this before"  . COPD (chronic obstructive pulmonary disease) (HCC)   . DOE (dyspnea on exertion)   . Exertional shortness of breath    "off and on" (12/10/2013)  . Family history of anesthesia complication    "mother; think they gave her too much; c/o pain after OR; gave her more RX; they had to bring her back real quick"  . H/O calcium pyrophosphate deposition disease (CPPD)   . Hiatal hernia   . Hyperlipidemia   . Hypertension   . PAD (peripheral artery disease) (HCC)   . Proteinuria   . Shingles   . Stenosis of tear duct     Social History Social History  Substance Use Topics  . Smoking status: Never Smoker  . Smokeless tobacco: Never Used  . Alcohol use 2.4 oz/week    4 Glasses of wine per week     Comment:  glass of wine every other night    Family History Family History  Problem Relation Age of Onset  . Emphysema Father 62  . CVA Mother   . Cancer Brother     ? type    Past Surgical History:  Procedure Laterality Date  . ABDOMINAL HYSTERECTOMY  1960's  . BACK SURGERY    . CESAREAN SECTION  1957; 1960  . CHOLECYSTECTOMY  ~ 2009  . LUMBAR DISC SURGERY  ?2004  . TONSILLECTOMY AND ADENOIDECTOMY  ?1940's    Allergies  Allergen Reactions  . Codeine     GI upset  . Crestor [Rosuvastatin] Other (See Comments)    MYALGIAS  . Darvon [Propoxyphene] Nausea Only  . Lipitor [Atorvastatin] Other (See Comments)    MYALGIAS  . Zocor [Simvastatin] Other (See Comments)    MYALGIAS    Current Outpatient Prescriptions  Medication Sig Dispense Refill  . acetaminophen (TYLENOL) 500 MG tablet Take 500 mg by mouth every 6 (six) hours as needed for moderate pain.    [name] amLODipine (NORVASC) 5 MG tablet Take 1 tablet (5 mg total) by mouth daily. 30 tablet 0  . aspirin EC 81 MG tablet Take 81 mg by mouth every evening.     . budesonide-formoterol (SYMBICORT) 160-4.5 MCG/ACT inhaler Inhale 2 puffs into the lungs 2 (two) times daily.    . Cholecalciferol (VITAMIN D PO) Take 1 capsule by mouth every evening.    . cilostazol (PLETAL)  50 MG tablet Take 50 mg by mouth 2 (two) times daily.   6  . diazepam (VALIUM) 5 MG tablet Take 0.5 tablets (2.5 mg total) by mouth every 6 (six) hours as needed for muscle spasms. 10 tablet 0  . diphenhydramine-acetaminophen (TYLENOL PM) 25-500 MG TABS Take 1 tablet by mouth at bedtime as needed (pain/sleep).     [name] lisinopril-hydrochlorothiazide (PRINZIDE,ZESTORETIC) 20-25 MG tablet Take half (1/2) tablet by mouth daily every evening.    . metoprolol tartrate (LOPRESSOR) 25 MG tablet TAKE ONE-HALF TABLET BY MOUTH TWICE DAILY 30 tablet 6  . ondansetron (ZOFRAN) 4 MG tablet Take 1 tablet (4 mg total) by mouth every 8 (eight) hours as needed for nausea or vomiting. 11 tablet 0    No current facility-administered medications for this visit.     ROS: See HPI for pertinent positives and negatives.   Physical Examination  Vitals:   08/06/16 1336  Resp: 16  Temp: 97.3 F (36.3 C)  TempSrc: Oral  SpO2: 99%  Weight: 123 lb (55.8 kg)  Height: 4\' 11"  (1.499 m)   Body mass index is 24.84 kg/m.  General: A&O x 3, WDWN. Gait: normal Eyes: PERRLA. Pulmonary: Respirations are non labored, CTAB, good air movement Cardiac: regular rhythm, rare premature and missed contractions, no detected murmur.         Carotid Bruits Right Left   Negative Negative  Aorta is not palpable. Radial pulses: 2+ palpable and =                           VASCULAR EXAM: Extremities without ischemic changes , without Gangrene; without open wounds.                                                                                                                                                                                                         LE Pulses Right Left       FEMORAL  2+ palpable  2+ palpable       POPLITEAL  not palpable  not palpable       POSTERIOR TIBIAL  biphasic by Doppler and not palpable  biphasic by Doppler and not palpable       DORSALIS PEDIS      ANTERIOR TIBIAL monophasic by Doppler and not palpable monophasic by Doppler and not palpable   Abdomen: soft, NT, no palpable masses. Skin: no rashes, no ulcers. Musculoskeletal: no muscle wasting or atrophy.  Neurologic: A&O X 3; Appropriate Affect; MOTOR FUNCTION:  moving all extremities equally, motor strength 5/5 throughout. Speech is fluent/normal. CN 2-12 intact.    ASSESSMENT: Sydney Gross is a 82 y.o. female who presents with stable mild intermittent claudication of both calves, no signs of ischemia in her lower extremities.  Today's ABI's are stable compared to a year ago. She is continuing her walking and exercising.  Her atherosclerotic risk factors include dyslipidemia,  statin intolerance, and extensive family history of dyslipidemia. Fortunately she does not have DM and never used tobacco.   PLAN:  Continue graduated walking program.   Based on the patient's vascular studies and examination, pt will return to clinic in 1 year with ABI's.  I advised her to notify us if she develops concerns re the circulation in her legs.  I discussed in depth with the patient the nature of atherosclerosis, and emphasized the importance of maximal medical management including strict control of blood pressure, blood glucose, and lipid levels, obtaining regular exercise, and continued cessation of smoking.  The patient is aware that without maximal medical management the underlying atherosclerotic disease process will progress, limiting the benefit of any interventions.  The patient was given information about PAD including signs, symptoms, treatment, what symptoms should prompt the patient to seek immediate medical care, and risk reduction measures to take.  Charisse MarchSuzanne Debbrah Sampedro, RN, MSN, FNP-C [facility] [place] Office Phone: [phone]  Clinic MD: Randie HeinzCain  08/06/16 2:04 PM

## 2016-08-06 NOTE — Patient Instructions (Addendum)
Intermittent Claudication Intermittent claudication is pain in your leg that occurs when you walk or exercise and goes away when you rest. The pain can occur in one or both legs. CAUSES Intermittent claudication is caused by the buildup of plaque within the major arteries in the body (atherosclerosis). The plaque, which makes arteries stiff and narrow, prevents enough blood from reaching your leg muscles. The pain occurs when you walk or exercise because your muscles need more blood when you are moving and exercising. RISK FACTORS Risk factors include:  A family history of atherosclerosis.  A personal history of stroke or heart disease.  Older age.  Being inactive or overweight.  Smoking cigarettes.  Having another health condition such as:  Diabetes.  High blood pressure.  High cholesterol. SIGNS AND SYMPTOMS  Your hip or leg may:   Ache.  Cramp.  Feel tight.  Feel weak.  Feel heavy. Over time, you may feel pain in your calf, thigh, or hip. DIAGNOSIS  Your health care provider may diagnose intermittent claudication based on your symptoms and medical history. Your health care provider may also do tests to learn more about your condition. These may include:  Blood tests.  An ultrasound.  Imaging tests such as angiography, magnetic resonance angiography (MRA), and computed tomography angiography (CTA). TREATMENT You may be treated for problems such as:  High blood pressure.  High cholesterol.  Diabetes. Other treatments may include:  Lifestyle changes such as:  Starting an exercise program.  Losing weight.  Quitting smoking.  Medicines to help restore blood flow through your legs.  Blood vessel surgery (angioplasty) to restore blood flow if your intermittent claudication is caused by severe peripheral artery disease. HOME CARE INSTRUCTIONS  Manage any other health conditions you have.  Eat a diet low in saturated fats and calories to maintain a  healthy weight.  Quit smoking, if you smoke.  Take medicines only as directed by your health care provider.  If your health care provider recommended an exercise program for you, follow it as directed. Your exercise program may involve:  Walking three or more times a week.  Walking until you have certain symptoms of intermittent claudication.  Resting until symptoms go away.  Gradually increasing walking time to about 50 minutes a day. SEEK MEDICAL CARE IF: Your condition is not getting better or is getting worse. SEEK IMMEDIATE MEDICAL CARE IF:   You have chest pain.  You have difficulty breathing.  You develop arm weakness.  You have trouble speaking.  Your face begins to droop. MAKE SURE YOU:  Understand these instructions.  Will watch your condition.  Will get help if you are not doing well or get worse.   This information is not intended to replace advice given to you by your health care provider. Make sure you discuss any questions you have with your health care provider.   Document Released: 09/24/2004 Document Revised: 12/13/2014 Document Reviewed: 02/28/2014 Elsevier Interactive Patient Education 2016 [employer]  

## 2016-09-20 DIAGNOSIS — H9201 Otalgia, right ear: Secondary | ICD-10-CM | POA: Diagnosis not present

## 2016-11-17 ENCOUNTER — Emergency Department (HOSPITAL_COMMUNITY): Payer: Medicare Other

## 2016-11-17 ENCOUNTER — Inpatient Hospital Stay (HOSPITAL_COMMUNITY)
Admission: EM | Admit: 2016-11-17 | Discharge: 2016-11-21 | DRG: 190 | Disposition: A | Discharge status: Home / Self Care | Payer: Medicare Other | Attending: Family Medicine | Admitting: Family Medicine

## 2016-11-17 ENCOUNTER — Encounter (HOSPITAL_COMMUNITY): Payer: Self-pay | Admitting: *Deleted

## 2016-11-17 DIAGNOSIS — Z7982 Long term (current) use of aspirin: Secondary | ICD-10-CM

## 2016-11-17 DIAGNOSIS — J9601 Acute respiratory failure with hypoxia: Secondary | ICD-10-CM | POA: Diagnosis present

## 2016-11-17 DIAGNOSIS — I350 Nonrheumatic aortic (valve) stenosis: Secondary | ICD-10-CM | POA: Diagnosis not present

## 2016-11-17 DIAGNOSIS — R001 Bradycardia, unspecified: Secondary | ICD-10-CM | POA: Diagnosis present

## 2016-11-17 DIAGNOSIS — J111 Influenza due to unidentified influenza virus with other respiratory manifestations: Secondary | ICD-10-CM | POA: Diagnosis not present

## 2016-11-17 DIAGNOSIS — F419 Anxiety disorder, unspecified: Secondary | ICD-10-CM | POA: Diagnosis not present

## 2016-11-17 DIAGNOSIS — Z66 Do not resuscitate: Secondary | ICD-10-CM | POA: Diagnosis present

## 2016-11-17 DIAGNOSIS — I10 Essential (primary) hypertension: Secondary | ICD-10-CM | POA: Diagnosis not present

## 2016-11-17 DIAGNOSIS — R0602 Shortness of breath: Secondary | ICD-10-CM | POA: Diagnosis not present

## 2016-11-17 DIAGNOSIS — I739 Peripheral vascular disease, unspecified: Secondary | ICD-10-CM | POA: Diagnosis not present

## 2016-11-17 DIAGNOSIS — R008 Other abnormalities of heart beat: Secondary | ICD-10-CM | POA: Diagnosis present

## 2016-11-17 DIAGNOSIS — J9600 Acute respiratory failure, unspecified whether with hypoxia or hypercapnia: Secondary | ICD-10-CM | POA: Diagnosis not present

## 2016-11-17 DIAGNOSIS — R0902 Hypoxemia: Secondary | ICD-10-CM | POA: Diagnosis not present

## 2016-11-17 DIAGNOSIS — R069 Unspecified abnormalities of breathing: Secondary | ICD-10-CM | POA: Diagnosis not present

## 2016-11-17 DIAGNOSIS — E785 Hyperlipidemia, unspecified: Secondary | ICD-10-CM | POA: Diagnosis not present

## 2016-11-17 DIAGNOSIS — J449 Chronic obstructive pulmonary disease, unspecified: Secondary | ICD-10-CM | POA: Diagnosis present

## 2016-11-17 DIAGNOSIS — J441 Chronic obstructive pulmonary disease with (acute) exacerbation: Secondary | ICD-10-CM | POA: Diagnosis not present

## 2016-11-17 DIAGNOSIS — Z79899 Other long term (current) drug therapy: Secondary | ICD-10-CM

## 2016-11-17 LAB — CBC WITH DIFFERENTIAL/PLATELET
Basophils Absolute: 0 10*3/uL (ref 0.0–0.1)
Basophils Relative: 0 %
Eosinophils Absolute: 0 10*3/uL (ref 0.0–0.7)
Eosinophils Relative: 1 %
HCT: 35.8 % — ABNORMAL LOW (ref 36.0–46.0)
Hemoglobin: 12 g/dL (ref 12.0–15.0)
Lymphocytes Relative: 8 %
Lymphs Abs: 0.5 10*3/uL — ABNORMAL LOW (ref 0.7–4.0)
MCH: 32.6 pg (ref 26.0–34.0)
MCHC: 33.5 g/dL (ref 30.0–36.0)
MCV: 97.3 fL (ref 78.0–100.0)
Monocytes Absolute: 0.5 10*3/uL (ref 0.1–1.0)
Monocytes Relative: 8 %
Neutro Abs: 5.3 10*3/uL (ref 1.7–7.7)
Neutrophils Relative %: 83 %
Platelets: 169 10*3/uL (ref 150–400)
RBC: 3.68 MIL/uL — ABNORMAL LOW (ref 3.87–5.11)
RDW: 13.5 % (ref 11.5–15.5)
WBC: 6.5 10*3/uL (ref 4.0–10.5)

## 2016-11-17 LAB — COMPREHENSIVE METABOLIC PANEL
ALT: 16 U/L (ref 14–54)
AST: 23 U/L (ref 15–41)
Albumin: 4.3 g/dL (ref 3.5–5.0)
Alkaline Phosphatase: 58 U/L (ref 38–126)
Anion gap: 8 (ref 5–15)
BUN: 23 mg/dL — ABNORMAL HIGH (ref 6–20)
CO2: 25 mmol/L (ref 22–32)
Calcium: 9.6 mg/dL (ref 8.9–10.3)
Chloride: 104 mmol/L (ref 101–111)
Creatinine, Ser: 0.83 mg/dL (ref 0.44–1.00)
GFR calc Af Amer: >60 mL/min (ref >60)
GFR calc non Af Amer: >60 mL/min (ref >60)
Glucose, Bld: 138 mg/dL — ABNORMAL HIGH (ref 65–99)
Potassium: 3.7 mmol/L (ref 3.5–5.1)
Sodium: 137 mmol/L (ref 135–145)
Total Bilirubin: 0.9 mg/dL (ref 0.3–1.2)
Total Protein: 7 g/dL (ref 6.5–8.1)

## 2016-11-17 LAB — I-STAT TROPONIN, ED: Troponin i, poc: 0.03 ng/mL (ref 0.00–0.08)

## 2016-11-17 MED ORDER — IPRATROPIUM-ALBUTEROL 0.5-2.5 (3) MG/3ML IN SOLN
3.0000 mL | Freq: Once | RESPIRATORY_TRACT | Status: AC
  Administered 2016-11-18: 3 mL via RESPIRATORY_TRACT
  Filled 2016-11-17: qty 3

## 2016-11-17 MED ORDER — AZITHROMYCIN 250 MG PO TABS
500.0000 mg | ORAL_TABLET | Freq: Once | ORAL | Status: AC
  Administered 2016-11-18: 500 mg via ORAL
  Filled 2016-11-17: qty 2

## 2016-11-17 MED ORDER — PREDNISONE 20 MG PO TABS
60.0000 mg | ORAL_TABLET | Freq: Once | ORAL | Status: AC
  Administered 2016-11-18: 60 mg via ORAL
  Filled 2016-11-17: qty 3

## 2016-11-17 NOTE — ED Provider Notes (Signed)
[facility]-EMERGENCY DEPT Provider Note   CSN: [id number] Arrival date & time: 11/17/16  2127     History   Chief Complaint Chief Complaint  Patient presents with  . Shortness of Breath    HPI Sydney Gross is a 82 y.o. female.  HPI History of COPD, no home oxygen, HTN, HLD, and PAD. 2 days of cough, minimal sputum, with low grade fever 83F. One day of severe DOE, with daily activity and walking from room to room at home, which is abnormal. Seen at [facility] and had abnormal CXR and sent to ED. She had hypoxia 86% and placed on [place]. Sent via EMS. No chest pain, LE edema. Had orthopnea last night. No abdominal pain, n/v/d. No sick contacts.  Past Medical History:  Diagnosis Date  . Allergic rhinitis   . Arthritis    "maybe a little in my knees and back" (12/10/2013)  . Colitis 12/2013   "never had this before"  . COPD (chronic obstructive pulmonary disease) (HCC)   . DOE (dyspnea on exertion)   . Exertional shortness of breath    "off and on" (12/10/2013)  . Family history of anesthesia complication    "mother; think they gave her too much; c/o pain after OR; gave her more RX; they had to bring her back real quick"  . H/O calcium pyrophosphate deposition disease (CPPD)   . Hiatal hernia   . Hyperlipidemia   . Hypertension   . PAD (peripheral artery disease) (HCC)   . Proteinuria   . Shingles   . Stenosis of tear duct     Patient Active Problem List   Diagnosis Date Noted  . COPD exacerbation (HCC) 11/18/2016  . Acute renal failure (HCC) 10/29/2015  . Diarrhea 10/29/2015  . Acute kidney injury (nontraumatic) (HCC) 10/29/2015  . Dehydration 10/29/2015  . Bradycardia   . Dilated aortic root (HCC) 01/27/2015  . Dyspnea on exertion 12/16/2014  . Aortic stenosis, supravalvular 12/16/2014  . Bilateral leg pain 08/30/2014  . Atherosclerosis of native arteries of extremity with intermittent claudication (HCC) 08/30/2014  . Malnutrition of moderate degree (HCC) 12/12/2013  .  Hypertension 12/09/2013  . Dyslipidemia 12/09/2013  . Cough 12/09/2013  . Enteritis due to Clostridium difficile 12/05/2013    Past Surgical History:  Procedure Laterality Date  . ABDOMINAL HYSTERECTOMY  1960's  . BACK SURGERY    . CESAREAN SECTION  1957; 1960  . CHOLECYSTECTOMY  ~ 2009  . LUMBAR DISC SURGERY  ?2004  . TONSILLECTOMY AND ADENOIDECTOMY  ?1940's    OB History    No data available       Home Medications    Prior to Admission medications   Medication Sig Start Date End Date Taking? Authorizing Provider  acetaminophen (TYLENOL) 500 MG tablet Take 500 mg by mouth every 6 (six) hours as needed for moderate pain.   Yes Historical Provider, MD  amLODipine (NORVASC) 5 MG tablet Take 1 tablet (5 mg total) by mouth daily. 10/31/15  Yes Penny Pia, MD  aspirin EC 81 MG tablet Take 81 mg by mouth every evening.    Yes Historical Provider, MD  budesonide-formoterol (SYMBICORT) 160-4.5 MCG/ACT inhaler Inhale 2 puffs into the lungs 2 (two) times daily.   Yes Historical Provider, MD  Cholecalciferol (VITAMIN D PO) Take 1 capsule by mouth every evening.   Yes Historical Provider, MD  cilostazol (PLETAL) 50 MG tablet Take 50 mg by mouth 2 (two) times daily.  11/30/14  Yes Historical Provider, MD  diazepam (VALIUM) 5 MG tablet Take 0.5 tablets (2.5 mg total) by mouth every 6 (six) hours as needed for muscle spasms. 10/28/15  Yes Courteney Lyn Mackuen, MD  diphenhydramine-acetaminophen (TYLENOL PM) 25-500 MG TABS Take 1 tablet by mouth at bedtime as needed (pain/sleep).    Yes Historical Provider, MD  lisinopril-hydrochlorothiazide (PRINZIDE,ZESTORETIC) 20-25 MG tablet Take half (1/2) tablet by mouth daily every evening.   Yes Historical Provider, MD  metoprolol tartrate (LOPRESSOR) 25 MG tablet TAKE ONE-HALF TABLET BY MOUTH TWICE DAILY 06/10/16  Yes Lyn RecordsHenry W Smith, MD  ondansetron (ZOFRAN) 4 MG tablet Take 1 tablet (4 mg total) by mouth every 8 (eight) hours as needed for nausea or  vomiting. Patient not taking: Reported on 11/17/2016 10/28/15   Courteney Lyn Mackuen, MD    Family History Family History  Problem Relation Age of Onset  . Emphysema Father [phone]  . CVA Mother   . Cancer Brother     ? type    Social History Social History  Substance Use Topics  . Smoking status: Never Smoker  . Smokeless tobacco: Never Used  . Alcohol use 2.4 oz/week    4 Glasses of wine per week     Comment: glass of wine every other night     Allergies   Codeine; Crestor [rosuvastatin]; Darvon [propoxyphene]; Lipitor [atorvastatin]; and Zocor [simvastatin]   Review of Systems Review of Systems 10/14 systems reviewed and are negative other than those stated in the HPI   Physical Exam Updated Vital Signs BP 134/75 (BP Location: Right Arm)   Pulse 116   Temp 98.5 F (36.9 C) (Oral)   Resp 23   Ht 4\' 11"  (1.499 m)   Wt 120 lb (54.4 kg)   SpO2 96%   BMI 24.24 kg/m   Physical Exam Physical Exam  Nursing note and vitals reviewed. Constitutional: Well developed, well nourished, non-toxic, and in no acute distress Head: Normocephalic and atraumatic.  Mouth/Throat: Oropharynx is clear and moist.  Neck: Normal range of motion. Neck supple.  Cardiovascular: Tachycardia rate and regular rhythm.  No edema Pulmonary/Chest: Effort normal. Speaks in full sentences. Wheezing in the right lower lung. Abdominal: Soft. There is no tenderness. There is no rebound and no guarding.  Musculoskeletal: Normal range of motion.  Neurological: Alert, no facial droop, fluent speech, moves all extremities symmetrically Skin: Skin is warm and dry.  Psychiatric: Cooperative   ED Treatments / Results  Labs (all labs ordered are listed, but only abnormal results are displayed) Labs Reviewed  CBC WITH DIFFERENTIAL/PLATELET - Abnormal; Notable for the following:       Result Value   RBC 3.68 (*)    HCT 35.8 (*)    Lymphs Abs 0.5 (*)    All other components within normal limits    COMPREHENSIVE METABOLIC PANEL - Abnormal; Notable for the following:    Glucose, Bld 138 (*)    BUN 23 (*)    All other components within normal limits  I-STAT TROPOININ, ED    EKG  EKG Interpretation  Date/Time:  Wednesday November 17 2016 23:13:06 EST Ventricular Rate:  101 PR Interval:    QRS Duration: 76 QT Interval:  354 QTC Calculation: 424 R Axis:   52 Text Interpretation:  Sinus tachycardia Ventricular bigeminy Probable left atrial enlargement Confirmed by Ethelda ChickJACUBOWITZ  MD, SAM [phone]([id number]) on 11/17/2016 11:17:16 PM       Radiology Dg Chest 2 View  Result Date: 11/17/2016 CLINICAL DATA:  Shortness of breath for 1 day  EXAM: CHEST  2 VIEW COMPARISON:  11/17/2016, 03/01/2016 FINDINGS: The lungs are slightly hyperinflated. Probable nipple shadow at the left base. No acute consolidation or effusion. Stable borderline to mild cardiomegaly without overt failure. No pneumothorax. Bones appear osteopenic. Surgical clips in the upper abdomen. IMPRESSION: Hyperinflation without focal infiltrate. Stable borderline to mild cardiomegaly Electronically Signed   By: Jasmine PangKim  Fujinaga M.D.   On: 11/17/2016 23:19    Procedures Procedures (including critical care time)  Medications Ordered in ED Medications  ipratropium-albuterol (DUONEB) 0.5-2.5 (3) MG/3ML nebulizer solution 3 mL (3 mLs Nebulization Given 11/18/16 0019)  predniSONE (DELTASONE) tablet 60 mg (60 mg Oral Given 11/18/16 0018)  azithromycin (ZITHROMAX) tablet 500 mg (500 mg Oral Given 11/18/16 0018)  ipratropium-albuterol (DUONEB) 0.5-2.5 (3) MG/3ML nebulizer solution 3 mL (3 mLs Nebulization Given 11/18/16 0039)     Initial Impression / Assessment and Plan / ED Course  I have reviewed the triage vital signs and the nursing notes.  Pertinent labs & imaging results that were available during my care of the patient were reviewed by me and considered in my medical decision making (see chart for details).  Clinical Course      Presenting with shortness of breath and cough. He is nontoxic in no acute distress. Normal work of breathing and normal oxygenation on 2 L nasal cannula. With expiratory wheezing noted on exam. Presentation consistent with adequate COPD exacerbation. Chest x-ray obtained, visualized, and shows no acute cardiopulmonary processes including pneumonia or edema. She is given additional breathing treatments with steroids and azithromycin. With persistent poor air movement and wheezing on exam despite additional breathing treatments. I discussed patient with Dr. Clyde LundborgNiu who will admit her for observation for ongoing COPD treatment.  Final Clinical Impressions(s) / ED Diagnoses   Final diagnoses:  COPD exacerbation (HCC)    New Prescriptions New Prescriptions   No medications on file     Lavera Guiseana Duo Yoona Ishii, MD 11/18/16 0151

## 2016-11-17 NOTE — ED Notes (Signed)
Bed: WA20 Expected date:  Expected time:  Means of arrival:  Comments: 82 yo F/ Difficulty breathing

## 2016-11-17 NOTE — ED Triage Notes (Signed)
Pt arrives from Clinic (pt lives at home) via EMS. Has been having SOB/cold symptoms since yesterday. At PCP, xray was completed and saw mass on the xray, sent here for further evaluation. Room air saturations 86% on room air, breathing treatment x 1 administered. Pt in NAD on arrival to treatment room

## 2016-11-18 DIAGNOSIS — I10 Essential (primary) hypertension: Secondary | ICD-10-CM | POA: Diagnosis not present

## 2016-11-18 DIAGNOSIS — J9601 Acute respiratory failure with hypoxia: Secondary | ICD-10-CM

## 2016-11-18 DIAGNOSIS — J449 Chronic obstructive pulmonary disease, unspecified: Secondary | ICD-10-CM | POA: Diagnosis present

## 2016-11-18 DIAGNOSIS — I739 Peripheral vascular disease, unspecified: Secondary | ICD-10-CM

## 2016-11-18 DIAGNOSIS — J441 Chronic obstructive pulmonary disease with (acute) exacerbation: Secondary | ICD-10-CM | POA: Diagnosis not present

## 2016-11-18 LAB — RESPIRATORY PANEL BY PCR
Adenovirus: NOT DETECTED
Bordetella pertussis: NOT DETECTED
Chlamydophila pneumoniae: NOT DETECTED
Coronavirus 229E: NOT DETECTED
Coronavirus HKU1: NOT DETECTED
Coronavirus NL63: NOT DETECTED
Coronavirus OC43: NOT DETECTED
Influenza A H3: DETECTED — AB
Influenza B: NOT DETECTED
Metapneumovirus: NOT DETECTED
Mycoplasma pneumoniae: NOT DETECTED
Parainfluenza Virus 1: NOT DETECTED
Parainfluenza Virus 2: NOT DETECTED
Parainfluenza Virus 3: NOT DETECTED
Parainfluenza Virus 4: NOT DETECTED
Respiratory Syncytial Virus: NOT DETECTED
Rhinovirus / Enterovirus: NOT DETECTED

## 2016-11-18 LAB — STREP PNEUMONIAE URINARY ANTIGEN: Strep Pneumo Urinary Antigen: NEGATIVE

## 2016-11-18 LAB — INFLUENZA PANEL BY PCR (TYPE A & B)
Influenza A By PCR: POSITIVE — AB
Influenza B By PCR: NEGATIVE

## 2016-11-18 MED ORDER — ACETAMINOPHEN 500 MG PO TABS: 500.0000 mg | ORAL_TABLET | Freq: Four times a day (QID) | ORAL | Status: DC | PRN

## 2016-11-18 MED ORDER — DIPHENHYDRAMINE-APAP (SLEEP) 25-500 MG PO TABS: 1.0000 | ORAL_TABLET | Freq: Every evening | ORAL | Status: DC | PRN

## 2016-11-18 MED ORDER — LISINOPRIL 10 MG PO TABS
10.0000 mg | ORAL_TABLET | Freq: Every day | ORAL | Status: DC
  Administered 2016-11-18 – 2016-11-20 (×3): 10 mg via ORAL
  Filled 2016-11-18 (×3): qty 1

## 2016-11-18 MED ORDER — VITAMIN D3 25 MCG (1000 UNIT) PO TABS
1000.0000 [IU] | ORAL_TABLET | Freq: Every evening | ORAL | Status: DC
  Administered 2016-11-18 – 2016-11-20 (×3): 1000 [IU] via ORAL
  Filled 2016-11-18 (×3): qty 1

## 2016-11-18 MED ORDER — METHYLPREDNISOLONE SODIUM SUCC 125 MG IJ SOLR
60.0000 mg | Freq: Two times a day (BID) | INTRAMUSCULAR | Status: DC
  Administered 2016-11-18 – 2016-11-19 (×3): 60 mg via INTRAVENOUS
  Filled 2016-11-18 (×3): qty 2

## 2016-11-18 MED ORDER — IPRATROPIUM-ALBUTEROL 0.5-2.5 (3) MG/3ML IN SOLN
3.0000 mL | Freq: Once | RESPIRATORY_TRACT | Status: AC
  Administered 2016-11-18: 3 mL via RESPIRATORY_TRACT
  Filled 2016-11-18: qty 3

## 2016-11-18 MED ORDER — AMLODIPINE BESYLATE 5 MG PO TABS
5.0000 mg | ORAL_TABLET | Freq: Every day | ORAL | Status: DC
  Administered 2016-11-18 – 2016-11-21 (×4): 5 mg via ORAL
  Filled 2016-11-18 (×5): qty 1

## 2016-11-18 MED ORDER — IPRATROPIUM-ALBUTEROL 0.5-2.5 (3) MG/3ML IN SOLN
3.0000 mL | Freq: Four times a day (QID) | RESPIRATORY_TRACT | Status: DC
  Administered 2016-11-18 – 2016-11-19 (×6): 3 mL via RESPIRATORY_TRACT
  Filled 2016-11-18 (×6): qty 3

## 2016-11-18 MED ORDER — BOOST PLUS PO LIQD
237.0000 mL | ORAL | Status: DC
  Filled 2016-11-18 (×3): qty 237

## 2016-11-18 MED ORDER — IPRATROPIUM-ALBUTEROL 0.5-2.5 (3) MG/3ML IN SOLN
3.0000 mL | RESPIRATORY_TRACT | Status: DC
Start: 2016-11-18 — End: 2016-11-18
  Administered 2016-11-18: 3 mL via RESPIRATORY_TRACT
  Filled 2016-11-18: qty 3

## 2016-11-18 MED ORDER — INFLUENZA VAC SPLIT QUAD 0.5 ML IM SUSY
0.5000 mL | PREFILLED_SYRINGE | INTRAMUSCULAR | Status: DC
  Filled 2016-11-18: qty 0.5

## 2016-11-18 MED ORDER — DIAZEPAM 5 MG PO TABS: 2.5000 mg | ORAL_TABLET | Freq: Four times a day (QID) | ORAL | Status: DC | PRN

## 2016-11-18 MED ORDER — ACETAMINOPHEN 500 MG PO TABS: 500.0000 mg | ORAL_TABLET | Freq: Every evening | ORAL | Status: DC | PRN

## 2016-11-18 MED ORDER — HYDRALAZINE HCL 20 MG/ML IJ SOLN: 5.0000 mg | INTRAMUSCULAR | Status: DC | PRN

## 2016-11-18 MED ORDER — OSELTAMIVIR PHOSPHATE 30 MG PO CAPS
30.0000 mg | ORAL_CAPSULE | Freq: Two times a day (BID) | ORAL | Status: DC
  Administered 2016-11-18 – 2016-11-21 (×7): 30 mg via ORAL
  Filled 2016-11-18 (×7): qty 1

## 2016-11-18 MED ORDER — HYDROCHLOROTHIAZIDE 12.5 MG PO CAPS
12.5000 mg | ORAL_CAPSULE | Freq: Every day | ORAL | Status: DC
  Administered 2016-11-18 – 2016-11-20 (×3): 12.5 mg via ORAL
  Filled 2016-11-18 (×3): qty 1

## 2016-11-18 MED ORDER — DM-GUAIFENESIN ER 30-600 MG PO TB12
1.0000 | ORAL_TABLET | Freq: Two times a day (BID) | ORAL | Status: DC
  Administered 2016-11-18 – 2016-11-21 (×8): 1 via ORAL
  Filled 2016-11-18 (×8): qty 1

## 2016-11-18 MED ORDER — LISINOPRIL-HYDROCHLOROTHIAZIDE 20-25 MG PO TABS: 1.0000 | ORAL_TABLET | Freq: Every day | ORAL | Status: DC

## 2016-11-18 MED ORDER — ENOXAPARIN SODIUM 40 MG/0.4ML ~~LOC~~ SOLN
40.0000 mg | SUBCUTANEOUS | Status: DC
  Administered 2016-11-18 – 2016-11-20 (×3): 40 mg via SUBCUTANEOUS
  Filled 2016-11-18 (×3): qty 0.4

## 2016-11-18 MED ORDER — DIPHENHYDRAMINE HCL 25 MG PO CAPS: 25.0000 mg | ORAL_CAPSULE | Freq: Every evening | ORAL | Status: DC | PRN

## 2016-11-18 MED ORDER — AZITHROMYCIN 250 MG PO TABS
250.0000 mg | ORAL_TABLET | Freq: Every day | ORAL | Status: DC
  Administered 2016-11-18 – 2016-11-21 (×4): 250 mg via ORAL
  Filled 2016-11-18 (×4): qty 1

## 2016-11-18 MED ORDER — ASPIRIN EC 81 MG PO TBEC
81.0000 mg | DELAYED_RELEASE_TABLET | Freq: Every evening | ORAL | Status: DC
  Administered 2016-11-18 – 2016-11-20 (×3): 81 mg via ORAL
  Filled 2016-11-18 (×3): qty 1

## 2016-11-18 MED ORDER — ALBUTEROL SULFATE (2.5 MG/3ML) 0.083% IN NEBU
2.5000 mg | INHALATION_SOLUTION | RESPIRATORY_TRACT | Status: DC | PRN
Start: 2016-11-18 — End: 2016-11-21
  Administered 2016-11-20: 2.5 mg via RESPIRATORY_TRACT
  Filled 2016-11-18: qty 3

## 2016-11-18 MED ORDER — METOPROLOL TARTRATE 25 MG PO TABS
12.5000 mg | ORAL_TABLET | Freq: Two times a day (BID) | ORAL | Status: DC
  Administered 2016-11-18 – 2016-11-20 (×5): 12.5 mg via ORAL
  Filled 2016-11-18 (×8): qty 1

## 2016-11-18 MED ORDER — ZOLPIDEM TARTRATE 5 MG PO TABS
5.0000 mg | ORAL_TABLET | Freq: Every evening | ORAL | Status: DC | PRN
  Administered 2016-11-18 – 2016-11-20 (×3): 5 mg via ORAL
  Filled 2016-11-18 (×3): qty 1

## 2016-11-18 MED ORDER — CILOSTAZOL 50 MG PO TABS
50.0000 mg | ORAL_TABLET | Freq: Two times a day (BID) | ORAL | Status: DC
  Administered 2016-11-18 – 2016-11-21 (×7): 50 mg via ORAL
  Filled 2016-11-18 (×9): qty 1

## 2016-11-18 NOTE — ED Notes (Signed)
20 min timer started now

## 2016-11-18 NOTE — Care Management Note (Signed)
Case Management Note  Patient Details  Name: Sydney Gross MRN: [id number] Date of Birth: [birth year]  Subjective/Objective:  82 y/o f admitted w/COPD. From home. PT-no f/u. Referral for COPD screen-Has pcp. No needs identified.                  Action/Plan:d/c plan home.   Expected Discharge Date:                 Expected Discharge Plan:  Home/Self Care  In-House Referral:     Discharge planning Services  CM Consult  Post Acute Care Choice:    Choice offered to:     DME Arranged:    DME Agency:     HH Arranged:    HH Agency:     Status of Service:  In process, will continue to follow  If discussed at Long Length of Stay Meetings, dates discussed:    Additional Comments:  Lanier ClamMahabir, Sydney Afonso, RN 11/18/2016, 1:07 PM

## 2016-11-18 NOTE — H&P (Signed)
History and Physical    Sydney Gross ZOX:[id number] DOB: 17-Dec-1929 DOA: 11/17/2016  Referring MD/NP/PA:   PCP: Lupita RaiderSHAW,KIMBERLEE, MD   Patient coming from:  The patient is coming from home.  At baseline, pt is independent for most of ADL.   Chief Complaint: Cough, shortness of breath  HPI: Sydney Gross is a [age] y.o. female with medical history significant of COPD, hypertension, hyperlipidemia, anxiety, PVD, CPPD, colitis, presents with cough and shortness breath.  Patient states that she has been having cold-like symptoms in the past 2 days. She has runny nose, no sore throat. She has cough with yellow colored sputum production. No chest pain. She has difficult breathing, but can speak in full sentences. No fever or chills. Patient has nausea, no vomiting, abdominal pain, diarrhea, symptoms of UTI. No unilateral weakness. Patient was seen by her PCP, and had abnormal x-ray for possible mass and therefore sent her to ED.  ED Course: pt was found to have WBC 6.5, negative troponin, electrolytes and renal function okay, temperature normal, bradycardia, saturation 97% on room air, chest x-ray is negative for infiltration, but showed possible nipple shadow at left base per radiologist.  Review of Systems:   General: no fevers, chills, no changes in body weight, has poor appetite, has fatigue HEENT: no blurry vision, hearing changes or sore throat Respiratory: has dyspnea, coughing, wheezing CV: no chest pain, no palpitations GI: has nausea, no vomiting, abdominal pain, diarrhea, constipation GU: no dysuria, burning on urination, increased urinary frequency, hematuria  Ext: no leg edema Neuro: no unilateral weakness, numbness, or tingling, no vision change or hearing loss Skin: no rash, no skin tear. MSK: No muscle spasm, no deformity, no limitation of range of movement in spin Heme: No easy bruising.  Travel history: No recent long distant travel.  Allergy:  Allergies  Allergen  Reactions  . Codeine     GI upset  . Crestor [Rosuvastatin] Other (See Comments)    MYALGIAS  . Darvon [Propoxyphene] Nausea Only  . Lipitor [Atorvastatin] Other (See Comments)    MYALGIAS  . Zocor [Simvastatin] Other (See Comments)    MYALGIAS    Past Medical History:  Diagnosis Date  . Allergic rhinitis   . Arthritis    "maybe a little in my knees and back" (12/10/2013)  . Colitis 12/2013   "never had this before"  . COPD (chronic obstructive pulmonary disease) (HCC)   . DOE (dyspnea on exertion)   . Exertional shortness of breath    "off and on" (12/10/2013)  . Family history of anesthesia complication    "mother; think they gave her too much; c/o pain after OR; gave her more RX; they had to bring her back real quick"  . H/O calcium pyrophosphate deposition disease (CPPD)   . Hiatal hernia   . Hyperlipidemia   . Hypertension   . PAD (peripheral artery disease) (HCC)   . Proteinuria   . Shingles   . Stenosis of tear duct     Past Surgical History:  Procedure Laterality Date  . ABDOMINAL HYSTERECTOMY  1960's  . BACK SURGERY    . CESAREAN SECTION  1957; 1960  . CHOLECYSTECTOMY  ~ 2009  . LUMBAR DISC SURGERY  ?2004  . TONSILLECTOMY AND ADENOIDECTOMY  ?1940's    Social History:  reports that she has never smoked. She has never used smokeless tobacco. She reports that she drinks about 2.4 oz of alcohol per week . She reports that she does not use drugs.  Family History:  Family History  Problem Relation Age of Onset  . Emphysema Father [phone]  . CVA Mother   . Cancer Brother     ? type     Prior to Admission medications   Medication Sig Start Date End Date Taking? Authorizing Provider  acetaminophen (TYLENOL) 500 MG tablet Take 500 mg by mouth every 6 (six) hours as needed for moderate pain.   Yes Historical Provider, MD  amLODipine (NORVASC) 5 MG tablet Take 1 tablet (5 mg total) by mouth daily. 10/31/15  Yes Penny Piarlando Vega, MD  aspirin EC 81 MG tablet Take 81 mg by  mouth every evening.    Yes Historical Provider, MD  budesonide-formoterol (SYMBICORT) 160-4.5 MCG/ACT inhaler Inhale 2 puffs into the lungs 2 (two) times daily.   Yes Historical Provider, MD  Cholecalciferol (VITAMIN D PO) Take 1 capsule by mouth every evening.   Yes Historical Provider, MD  cilostazol (PLETAL) 50 MG tablet Take 50 mg by mouth 2 (two) times daily.  11/30/14  Yes Historical Provider, MD  diazepam (VALIUM) 5 MG tablet Take 0.5 tablets (2.5 mg total) by mouth every 6 (six) hours as needed for muscle spasms. 10/28/15  Yes Courteney Lyn Mackuen, MD  diphenhydramine-acetaminophen (TYLENOL PM) 25-500 MG TABS Take 1 tablet by mouth at bedtime as needed (pain/sleep).    Yes Historical Provider, MD  lisinopril-hydrochlorothiazide (PRINZIDE,ZESTORETIC) 20-25 MG tablet Take half (1/2) tablet by mouth daily every evening.   Yes Historical Provider, MD  metoprolol tartrate (LOPRESSOR) 25 MG tablet TAKE ONE-HALF TABLET BY MOUTH TWICE DAILY 06/10/16  Yes Lyn RecordsHenry W Smith, MD  ondansetron (ZOFRAN) 4 MG tablet Take 1 tablet (4 mg total) by mouth every 8 (eight) hours as needed for nausea or vomiting. Patient not taking: Reported on 11/17/2016 10/28/15   Courteney Lyn Mackuen, MD    Physical Exam: Vitals:   11/17/16 2145 11/17/16 2234 11/17/16 2346 11/18/16 0147  BP: 182/79 162/69 158/74 134/75  Pulse: 95 97 (!) 46 116  Resp: 22 16 23 23   Temp:      TempSrc:      SpO2: 99% 97% 97% 96%  Weight:      Height:       General: Not in acute distress HEENT:       Eyes: PERRL, EOMI, no scleral icterus.       ENT: No discharge from the ears and nose, no pharynx injection, no tonsillar enlargement.        Neck: No JVD, no bruit, no mass felt. Heme: No neck lymph node enlargement. Cardiac: S1/S2, RRR, No murmurs, No gallops or rubs. Respiratory: Decreased air movement bilaterally. His wheezing bilaterally. No rales or rubs. GI: Soft, nondistended, nontender, no rebound pain, no organomegaly, BS  present. GU: No hematuria Ext: No pitting leg edema bilaterally. 2+DP/PT pulse bilaterally. Musculoskeletal: No joint deformities, No joint redness or warmth, no limitation of ROM in spin. Skin: No rashes.  Neuro: Alert, oriented X3, cranial nerves II-XII grossly intact, moves all extremities normally. Psych: Patient is not psychotic, no suicidal or hemocidal ideation.  Labs on Admission: I have personally reviewed following labs and imaging studies  CBC:  Recent Labs Lab 11/17/16 2309  WBC 6.5  NEUTROABS 5.3  HGB 12.0  HCT 35.8*  MCV 97.3  PLT 169   Basic Metabolic Panel:  Recent Labs Lab 11/17/16 2309  NA 137  K 3.7  CL 104  CO2 25  GLUCOSE 138*  BUN 23*  CREATININE 0.83  CALCIUM 9.6  GFR: Estimated Creatinine Clearance: 38 mL/min (by C-G formula based on SCr of 0.83 mg/dL). Liver Function Tests:  Recent Labs Lab 11/17/16 2309  AST 23  ALT 16  ALKPHOS 58  BILITOT 0.9  PROT 7.0  ALBUMIN 4.3   No results for input(s): LIPASE, AMYLASE in the last 168 hours. No results for input(s): AMMONIA in the last 168 hours. Coagulation Profile: No results for input(s): INR, PROTIME in the last 168 hours. Cardiac Enzymes: No results for input(s): CKTOTAL, CKMB, CKMBINDEX, TROPONINI in the last 168 hours. BNP (last 3 results) No results for input(s): PROBNP in the last 8760 hours. HbA1C: No results for input(s): HGBA1C in the last 72 hours. CBG: No results for input(s): GLUCAP in the last 168 hours. Lipid Profile: No results for input(s): CHOL, HDL, LDLCALC, TRIG, CHOLHDL, LDLDIRECT in the last 72 hours. Thyroid Function Tests: No results for input(s): TSH, T4TOTAL, FREET4, T3FREE, THYROIDAB in the last 72 hours. Anemia Panel: No results for input(s): VITAMINB12, FOLATE, FERRITIN, TIBC, IRON, RETICCTPCT in the last 72 hours. Urine analysis:    Component Value Date/Time   COLORURINE ORANGE (A) 10/29/2015 1955   APPEARANCEUR CLOUDY (A) 10/29/2015 1955    LABSPEC 1.012 10/29/2015 1955   PHURINE 6.0 10/29/2015 1955   GLUCOSEU NEGATIVE 10/29/2015 1955   HGBUR NEGATIVE 10/29/2015 1955   BILIRUBINUR NEGATIVE 10/29/2015 1955   KETONESUR NEGATIVE 10/29/2015 1955   PROTEINUR 30 (A) 10/29/2015 1955   UROBILINOGEN 0.2 12/09/2013 1902   NITRITE NEGATIVE 10/29/2015 1955   LEUKOCYTESUR TRACE (A) 10/29/2015 1955   Sepsis Labs: @LABRCNTIP (procalcitonin:4,lacticidven:4) )No results found for this or any previous visit (from the past 240 hour(s)).   Radiological Exams on Admission: Dg Chest 2 View  Result Date: 11/17/2016 CLINICAL DATA:  Shortness of breath for 1 day EXAM: CHEST  2 VIEW COMPARISON:  11/17/2016, 03/01/2016 FINDINGS: The lungs are slightly hyperinflated. Probable nipple shadow at the left base. No acute consolidation or effusion. Stable borderline to mild cardiomegaly without overt failure. No pneumothorax. Bones appear osteopenic. Surgical clips in the upper abdomen. IMPRESSION: Hyperinflation without focal infiltrate. Stable borderline to mild cardiomegaly Electronically Signed   By: Jasmine Pang M.D.   On: 11/17/2016 23:19     EKG: Independently reviewed.  Sinus rhythm, QTC 444, LAE.     Assessment/Plan Principal Problem:   COPD exacerbation (HCC) Active Problems:   Hypertension   Acute respiratory failure with hypoxia (HCC)   PAD (peripheral artery disease) (HCC)   Anxiety   Acute respiratory failure with hypoxia due to COPD exacerbation: Patient has productive cough, shortness of breath and wheezing on auscultation, consistent with COPD exacerbation. X-ray in PCP's office showed possible mass, which is not seen on the repeated CXR in ED, but showed possible nipple shadow at left base per radiologist. This need to be followed up with PCP and probably needed to have a chest x-ray repeated later on.  -will place on telemetry bed for obs -Nebulizers: scheduled Duoneb and prn albuterol -Solu-Medrol 60 mg IV bid -Oral  azithromycin for 5 days.  -Mucinex for cough  -Urine S. pneumococcal antigen -Follow up sputum culture, respiratory virus panel, Flu pcr  Hypertension: Bp 182/79-->158/74 -continue metoprolol, amlodipine, Prinzide -Hydralazine IV when necessary  Hx of PAD (peripheral artery disease) -continue ASA and cilostazole   DVT ppx: SQ Lovenox Code Status: Full code Family Communication: None at bed side.   Disposition Plan:  Anticipate discharge back to previous home environment Consults called:  none Admission status: Obs / tele  Date of Service 11/18/2016    Lorretta Harp [facility] Pager [phone]  If 7PM-7AM, please contact night-coverage [email or web address] Password [email or web address] 11/18/2016, 2:13 AM

## 2016-11-18 NOTE — Evaluation (Signed)
Occupational Therapy Evaluation Patient Details Name: Sydney Gross MRN: [id number] DOB: 1930/01/13 Today's Date: 11/18/2016    History of Present Illness 82 yo female admitted with COPD exac. Hx of COPD with DOE, HTN, PAD, shingles, OA   Clinical Impression   Pt is safe and independent with ADLs and ADL mobility. No OT indicated at this time    Follow Up Recommendations  No OT follow up    Equipment Recommendations  None recommended by OT    Recommendations for Other Services       Precautions / Restrictions Precautions Precautions: None Precaution Comments: monitor O2 sats Restrictions Weight Bearing Restrictions: No      Mobility Bed Mobility Overal bed mobility: Independent                Transfers Overall transfer level: Independent                    Balance Overall balance assessment: No apparent balance deficits (not formally assessed)                                          ADL Overall ADL's : At baseline;Independent                                             Vision Vision Assessment?: No apparent visual deficits              Pertinent Vitals/Pain Pain Assessment: No/denies pain     Hand Dominance Right   Extremity/Trunk Assessment Upper Extremity Assessment Upper Extremity Assessment: Overall WFL for tasks assessed   Lower Extremity Assessment Lower Extremity Assessment: Defer to PT evaluation   Cervical / Trunk Assessment Cervical / Trunk Assessment: Normal   Communication Communication Communication: No difficulties   Cognition Arousal/Alertness: Awake/alert Behavior During Therapy: WFL for tasks assessed/performed Overall Cognitive Status: Within Functional Limits for tasks assessed                     General Comments   pt very pleasant and cooperative                 Home Living Family/patient expects to be discharged to:: Private residence Living  Arrangements: Spouse/significant other Available Help at Discharge: Family Type of Home: House Home Access: Stairs to enter   Entrance Stairs-Rails: Right Home Layout: One level     Bathroom Shower/Tub: Tub/shower unit;Walk-in shower   Bathroom Toilet: Standard     Home Equipment: None          Prior Functioning/Environment Level of Independence: Independent        Comments: exercises 3x/week        OT Problem List: Decreased activity tolerance   OT Treatment/Interventions:      OT Goals(Current goals can be found in the care plan section) Acute Rehab OT Goals Patient Stated Goal: home. get back to working out.  OT Goal Formulation: With patient  OT Frequency:     Barriers to D/C: Decreased caregiver support                        End of Session Equipment Utilized During Treatment: Oxygen  Activity Tolerance: Patient tolerated treatment well Patient left: in chair;with  call bell/phone within reach;with chair alarm set   Time: 1131-1144 OT Time Calculation (min): 13 min Charges:    G-Codes: OT G-codes **NOT FOR INPATIENT CLASS** Functional Assessment Tool Used: clinical judgement Functional Limitation: Self care Self Care Current Status (W0981(G8987): At least 1 percent but less than 20 percent impaired, limited or restricted Self Care Goal Status ([id number]([id number]): At least 1 percent but less than 20 percent impaired, limited or restricted Self Care Discharge Status [phone]([id number]): At least 1 percent but less than 20 percent impaired, limited or restricted  [name], [name] 11/18/2016, 1:16 PM

## 2016-11-18 NOTE — Care Management Obs Status (Signed)
MEDICARE OBSERVATION STATUS NOTIFICATION   Patient Details  Name: Sydney Gross MRN: [id number] Date of Birth: [birth year]   Medicare Observation Status Notification Given:  Yes    MahabirOlegario Messier, Inocencio Roy, RN 11/18/2016, 1:08 PM

## 2016-11-18 NOTE — Progress Notes (Signed)
CSW received consult for COPD Gold Protocol, though patient does not meet qualifications (only 1 admission within the past 6 months).   CSW signing off.   Novalee Horsfall, LCSW [facility] Clinical Social Worker cell #: [phone]    

## 2016-11-18 NOTE — Evaluation (Signed)
Physical Therapy Evaluation-1x Patient Details Name: Sydney Gross MRN: [id number] DOB: 1930-01-19 Today's Date: 11/18/2016   History of Present Illness  82 yo female admitted with COPD exac. Hx of COPD with DOE, HTN, PAD, shingles, OA  Clinical Impression  On eval, pt was supervision-mod ind level of mobility. She walked ~175 feet. O2 sats dropped to 83% on RA during ambulation. Sats rebounded to 91% on RA with seated rest break and cues for pursed lip/deep breathing. Pt does not currently have any PT needs-will sign off. Recommend daily ambulation in hallway. Recommend nursing reassess O2 sat levels prior to d/c.     Follow Up Recommendations No PT follow up    Equipment Recommendations  None recommended by PT    Recommendations for Other Services       Precautions / Restrictions Precautions Precautions: None Precaution Comments: monitor O2 sats Restrictions Weight Bearing Restrictions: No      Mobility  Bed Mobility Overal bed mobility: Independent                Transfers Overall transfer level: Independent                  Ambulation/Gait Ambulation/Gait assistance: Supervision Ambulation Distance (Feet): 175 Feet Assistive device: None Gait Pattern/deviations: WFL(Within Functional Limits)     General Gait Details: No LOB. Mildly unsteady intermittently but no LOB. O2 sats dropped to 83% on RA, dyspnea 2/4 with audible wheezing  Stairs            Wheelchair Mobility    Modified Rankin (Stroke Patients Only)       Balance Overall balance assessment: No apparent balance deficits (not formally assessed)                                           Pertinent Vitals/Pain Pain Assessment: No/denies pain    Home Living Family/patient expects to be discharged to:: Private residence Living Arrangements: Spouse/significant other   Type of Home: House       Home Layout: One level Home Equipment: None      Prior  Function Level of Independence: Independent         Comments: exercises 3x/week     Hand Dominance        Extremity/Trunk Assessment   Upper Extremity Assessment Upper Extremity Assessment: Overall WFL for tasks assessed    Lower Extremity Assessment Lower Extremity Assessment: Overall WFL for tasks assessed    Cervical / Trunk Assessment Cervical / Trunk Assessment: Normal  Communication   Communication: No difficulties  Cognition Arousal/Alertness: Awake/alert Behavior During Therapy: WFL for tasks assessed/performed Overall Cognitive Status: Within Functional Limits for tasks assessed                      General Comments      Exercises     Assessment/Plan    PT Assessment Patent does not need any further PT services (Recommend daily ambulation in hallways )  PT Problem List            PT Treatment Interventions      PT Goals (Current goals can be found in the Care Plan section)  Acute Rehab PT Goals Patient Stated Goal: home. get back to working out.  PT Goal Formulation: All assessment and education complete, DC therapy    Frequency     Barriers to  discharge        Co-evaluation               End of Session Equipment Utilized During Treatment: Gait belt Activity Tolerance: Patient tolerated treatment well Patient left: in chair;with call bell/phone within reach;with chair alarm set      Functional Assessment Tool Used: clinical judgement Functional Limitation: Mobility: Walking and moving around Mobility: Walking and Moving Around Current Status ([id number](G8978): At least 1 percent but less than 20 percent impaired, limited or restricted Mobility: Walking and Moving Around Goal Status [phone](G8979): At least 1 percent but less than 20 percent impaired, limited or restricted Mobility: Walking and Moving Around Discharge Status [phone](G8980): At least 1 percent but less than 20 percent impaired, limited or restricted    Time: [id number] PT Time  Calculation (min) (ACUTE ONLY): 15 min   Charges:   PT Evaluation $PT Eval Low Complexity: 1 Procedure     PT G Codes:   PT G-Codes **NOT FOR INPATIENT CLASS** Functional Assessment Tool Used: clinical judgement Functional Limitation: Mobility: Walking and moving around Mobility: Walking and Moving Around Current Status ([id number](G8978): At least 1 percent but less than 20 percent impaired, limited or restricted Mobility: Walking and Moving Around Goal Status [phone](G8979): At least 1 percent but less than 20 percent impaired, limited or restricted Mobility: Walking and Moving Around Discharge Status [phone](G8980): At least 1 percent but less than 20 percent impaired, limited or restricted    Rebeca AlertJannie Daymion Nazaire, MPT Pager: [phone]

## 2016-11-18 NOTE — Progress Notes (Signed)
Initial Nutrition Assessment  DOCUMENTATION CODES:   Not applicable  INTERVENTION:  - Will order Boost Plus once/day, this supplement provides 360 kcal and 14 grams of protein.  - Continue to encourage PO intakes of meals and supplement. - RD will continue to monitor for additional nutrition-related needs.   NUTRITION DIAGNOSIS:   Increased nutrient needs related to chronic illness (COPD) as evidenced by estimated needs (for protein).  GOAL:   Patient will meet greater than or equal to 90% of their needs  MONITOR:   PO intake, Weight trends, Labs, I & O's  REASON FOR ASSESSMENT:   Consult Assessment of nutrition requirement/status  ASSESSMENT:   82 y.o. female with medical history significant of COPD, hypertension, hyperlipidemia, anxiety, PVD, CPPD, colitis, presents with cough and shortness breath. Patient states that she has been having cold-like symptoms in the past 2 days. She has runny nose, no sore throat. She has cough with yellow colored sputum production. No chest pain. She has difficulty breathing, but can speak in full sentences. No fever or chills. Patient has nausea, no vomiting, abdominal pain, diarrhea, symptoms of UTI. No unilateral weakness. Patient was seen by her PCP, and had abnormal x-ray for possible mass and therefore sent her to ED.  Pt seen for consult. BMI indicates normal weight. No documented intakes since admission. Pt sleeping soundly (snoring) at time of visit and no family/visitors present at that time; did not want to awake pt. Spoke with RN who states that pt did eat lunch but she is unsure of how much of the meal pt consumed. For lunch pt received: grilled cheese sandwich on white bread, side salad with thousand island dressing, pineapple pieces, bag of baked potato chips, and a cup of sweet tea.  Unable to perform physical assessment at this time but will do so at follow-up. No visible muscle or fat wasting to upper body. Per chart review, pt has  lost 3 lbs (2.4% body weight) in the past 3 months which is not significant for time frame.  Will order Boost Plus once/day to supplement protein needs. Will continue to monitor for additional needs during admission. H&P indicates pt with hx of colitis. Will discuss this with pt at follow-up to determine if this is ongoing and, if so, any food irritants that she avoids.   Medications reviewed; 1000 units oral vitamin D/day, 60 mg IV Solu-medrol BID, 60 mg Deltasone x1 dose yesterday. Labs reviewed; BUN: 23 mg/dL.    Diet Order:  Diet Heart Room service appropriate? Yes; Fluid consistency: Thin  Skin:  Reviewed, no issues  Last BM:  12/13  Height:   Ht Readings from Last 1 Encounters:  11/18/16 4\' 11"  (1.499 m)    Weight:   Wt Readings from Last 1 Encounters:  11/18/16 120 lb 6.4 oz (54.6 kg)    Ideal Body Weight:  42.91 kg  BMI:  Body mass index is 24.32 kg/m.  Estimated Nutritional Needs:   Kcal:  1260-1475 (23-27 kcal/kg)  Protein:  60-70 grams (1.1-1.3 gram/kg)  Fluid:  >/= 1.4 L/day  EDUCATION NEEDS:   No education needs identified at this time    Trenton GammonJessica Adra Shepler, MS, RD, LDN, CNSC Inpatient Clinical Dietitian Pager # [phone] After hours/weekend pager # [phone]

## 2016-11-18 NOTE — ED Notes (Signed)
Will ambulate pt after breathing treatment.

## 2016-11-18 NOTE — Progress Notes (Signed)
PROGRESS NOTE  Sydney Gross  ZOX:[id number] DOB: 02-Apr-1930 DOA: 11/17/2016 PCP: Lupita RaiderSHAW,KIMBERLEE, MD  Outpatient Specialists: Rosine DoorJohn Clinger, ENT Lona Kettleobert Yeatts, othphalmology Verdis PrimeHenry Smith, cardiology Charisse MarchSuzanne Nickel, NP, vascular surgery   Brief Narrative: Sydney Loatricia D Ailes is a 82 y.o. female with medical history significant of COPD, hypertension, hyperlipidemia, anxiety, PVD, CPPD, colitis, presented with cough and dyspnea in setting of URI symptoms. She presented to PCP for evaluation of cough productive of yellow sputum without fever and had abnormal x-ray for possible mass and therefore sent her to ED. On arrival she was dyspneic, saturating in 80%'s on room air. Afebrile with wheezes, speaking full sentences. WBC 6.5, negative troponin, electrolytes and renal function okay, no infiltrate on CXR, suspected nipple shadow at left base per radiologist. She was brought in for IV solumedrol, azithromycin, breathing treatments, and oxygen supplementation and shown some improvement overnight.  Assessment & Plan: Principal Problem:   COPD exacerbation (HCC) Active Problems:   Hypertension   Acute respiratory failure with hypoxia (HCC)   PAD (peripheral artery disease) (HCC)  Acute hypoxemic respiratory failure due to COPD exacerbation: Patient has productive cough, shortness of breath and wheezing on auscultation, consistent with COPD exacerbation sparked by viral URI.No h/o tobacco use and reports COPD was diagnosed by PFTs remotely. No pulmonologist. - Continue scheduled Duoneb and prn albuterol - Continue solu-medrol 60 mg IV BID, plan to transition to po prednisone 12/15 if improvement continues - Continue azithromycin for 5 days (12/13 > 12/17)  - Mucinex prn cough  - Urine S. pneumococcal antigen pending - Follow up sputum culture, respiratory virus panel, Flu pcr (needs collected) - Consider outpatient PFT's and/orpulmonology referral  Hypertension: Chronic, stable. - Continue  metoprolol, amlodipine, Prinzide -Hydralazine IV when necessary  Hx of PAD (peripheral artery disease): no signs of ischemia in her lower extremities. Recent ABI's are stable compared to a year ago. She is continuing her walking and exercising. - Continue ASA and cilostazol - Continue graduated walking program.   Supravalvular aortic stenosis with subacute/chronic history of dyspnea on exertion followed by Dr. Katrinka BlazingSmith. Most recent echo shows valvular sclerosis without stenosis.  - No intervention planned.  Ventricular bigeminy: Suspect related to acute illness. No sustained ventricular beats.  - Monitor on telemetry   Abnormal CXR: X-ray in PCP's office showed possible mass, which is not seen on the repeated CXR in ED, but showed possible nipple shadow at left base per radiologist.  - This needs to be followed up with PCP and probably needed to have a chest x-ray repeated later on.  DVT prophylaxis: Lovenox Code Status: Full Family Communication: Husband and daughter at bedside Disposition Plan: Continue tele/obs, still with oxygen requirement.   Consultants:   None  Procedures:   None  Antimicrobials:  Azithromycin 12/13 >>    Subjective: Pt reports improved breathing from admission, still wheezing improved with breathing treatments. Winded with minimal exertion, normally participates in exercise classes three times weekly.   Objective: Vitals:   11/17/16 2346 11/18/16 0147 11/18/16 0311 11/18/16 0816  BP: 158/74 134/75 127/85   Pulse: (!) 46 116 (!) 104   Resp: 23 23 (!) 22   Temp:   98.8 F (37.1 C)   TempSrc:   Oral   SpO2: 97% 96% 98% 97%  Weight:   54.6 kg (120 lb 6.4 oz)   Height:   4\' 11"  (1.499 m)    No intake or output data in the 24 hours ending 11/18/16 0845 Filed Weights   11/17/16 2130  11/18/16 0311  Weight: 54.4 kg (120 lb) 54.6 kg (120 lb 6.4 oz)    Examination: General exam: Pleasant, elderly female in no distress  Respiratory system:  Non-labored breathing 2L by [place]. Diffuse rhonchi and end-expiratory wheezing 15 minutes after breathing treatment.  Cardiovascular system: Regular with intermittent bigeminy with rate ~100bpm. No murmur, rub, or gallop. No JVD, and no pedal edema. Gastrointestinal system: Abdomen soft, non-tender, non-distended, with normoactive bowel sounds. No organomegaly or masses felt. Central nervous system: Alert and oriented. No focal neurological deficits. Extremities: Warm, no deformities Skin: No rashes, lesions no ulcers Psychiatry: Judgement and insight appear normal. Mood & affect appropriate.   Data Reviewed: I have personally reviewed following labs and imaging studies  CBC:  Recent Labs Lab 11/17/16 2309  WBC 6.5  NEUTROABS 5.3  HGB 12.0  HCT 35.8*  MCV 97.3  PLT 169   Radiology Studies: Dg Chest 2 View  Result Date: 11/17/2016 CLINICAL DATA:  Shortness of breath for 1 day EXAM: CHEST  2 VIEW COMPARISON:  11/17/2016, 03/01/2016 FINDINGS: The lungs are slightly hyperinflated. Probable nipple shadow at the left base. No acute consolidation or effusion. Stable borderline to mild cardiomegaly without overt failure. No pneumothorax. Bones appear osteopenic. Surgical clips in the upper abdomen. IMPRESSION: Hyperinflation without focal infiltrate. Stable borderline to mild cardiomegaly Electronically Signed   By: Jasmine PangKim  Fujinaga M.D.   On: 11/17/2016 23:19    Hazeline Junkeryan Rhys Anchondo, MD [facility] Pager [phone]  If 7PM-7AM, please contact night-coverage [email or web address] Password [email or web address] 11/18/2016, 8:45 AM

## 2016-11-19 DIAGNOSIS — J441 Chronic obstructive pulmonary disease with (acute) exacerbation: Secondary | ICD-10-CM | POA: Diagnosis not present

## 2016-11-19 DIAGNOSIS — J9600 Acute respiratory failure, unspecified whether with hypoxia or hypercapnia: Secondary | ICD-10-CM | POA: Diagnosis present

## 2016-11-19 DIAGNOSIS — J111 Influenza due to unidentified influenza virus with other respiratory manifestations: Secondary | ICD-10-CM | POA: Diagnosis present

## 2016-11-19 LAB — TROPONIN I: Troponin I: <0.03 ng/mL (ref <0.03)

## 2016-11-19 MED ORDER — PREDNISONE 20 MG PO TABS: 40.0000 mg | ORAL_TABLET | Freq: Every day | ORAL | Status: DC

## 2016-11-19 MED ORDER — IPRATROPIUM-ALBUTEROL 0.5-2.5 (3) MG/3ML IN SOLN
3.0000 mL | Freq: Three times a day (TID) | RESPIRATORY_TRACT | Status: DC
  Administered 2016-11-20 – 2016-11-21 (×4): 3 mL via RESPIRATORY_TRACT
  Filled 2016-11-19 (×4): qty 3

## 2016-11-19 NOTE — Progress Notes (Addendum)
PROGRESS NOTE  Sydney Gross  ZOX:[id number] DOB: [birth year] DOA: 11/17/2016 PCP: Lupita RaiderSHAW,KIMBERLEE, MD  Outpatient Specialists: Rosine DoorJohn Clinger, ENT Lona Kettleobert Yeatts, othphalmology Verdis PrimeHenry Smith, cardiology Charisse MarchSuzanne Nickel, NP, vascular surgery  Brief Narrative: Sydney Gross is a 82 y.o. female with medical history significant of COPD, hypertension, hyperlipidemia, anxiety, PVD, CPPD, colitis, presented with cough and dyspnea in setting of URI symptoms. She presented to PCP for evaluation of cough productive of yellow sputum without fever and had abnormal x-ray for possible mass and therefore sent her to ED. On arrival she was dyspneic, saturating in 80%'s on room air. Afebrile with wheezes, speaking full sentences. WBC 6.5, negative troponin, electrolytes and renal function okay, no infiltrate on CXR, suspected nipple shadow at left base per radiologist. She was brought in for IV solumedrol, azithromycin, breathing treatments, and oxygen supplementation. Improvement has been slow, still having oxygen requirement. Influenza PCR positive.   Assessment & Plan: Principal Problem:   COPD exacerbation (HCC) Active Problems:   Hypertension   Acute respiratory failure with hypoxia (HCC)   PAD (peripheral artery disease) (HCC)  Acute hypoxemic respiratory failure due to COPD exacerbation due to influenza: Patient has productive cough, shortness of breath and wheezing on auscultation, consistent with COPD exacerbation sparked by flu.No h/o tobacco use and reports COPD was diagnosed by PFTs remotely. No pulmonologist. Flu PCR+.  - Continue scheduled Duoneb and prn albuterol - Transition to po prednisone 12/16, wheezing improving - Continue azithromycin for 5 days (12/13 > 12/17)  - Continue tamiflu 75 mg BID x5 days. (12/14 > 12/18) - Follow up sputum culture - Consider outpatient PFT's and/or pulmonology referral - Checked troponin x2 due to pleuritic chest pain with cough, both negative. No ACS.     Hypertension: Chronic, stable. - Continue metoprolol, amlodipine, Prinzide - Hydralazine IV when necessary  Hx of PAD (peripheral artery disease): no signs of ischemia in her lower extremities. Recent ABI's are stable compared to a year ago. She is continuing her walking and exercising. - Continue ASA and cilostazol - Continue graduated walking program.   Supravalvular aortic stenosis with subacute/chronic history of dyspnea on exertion followed by Dr. Katrinka BlazingSmith. Most recent echo shows valvular sclerosis without stenosis.  - No intervention planned.   Ventricular bigeminy: Suspect related to acute illness, asymptomatic. No sustained ventricular beats or pauses.  - Monitor on telemetry   Abnormal CXR: X-ray in PCP's office showed possible mass, which is not seen on the repeated CXR in ED, but showed possible nipple shadow at left base per radiologist.  - Recommend repeat CXR as outpatient following acute illness  DVT prophylaxis: Lovenox Code Status: Full Family Communication: None at bedside Disposition Plan: Continue telemetry, will require inpatient for acute respiratory failure refractory due to flu. Would be discharged home where there are multiple sick contacts and limited support, which would put at risk for reinfection/readmission.   Consultants:   None  Procedures:   None  Antimicrobials:  Azithromycin 12/13 >>   Tamiflu 12/14 >>   Subjective: Still requiring oxygen for dyspnea on exertion. Both husband and daughter "caught it too." and are at home with flu-like symptoms. Has chest pain with deep inspiration.  Objective: Vitals:   11/18/16 1829 11/18/16 2215 11/19/16 0157 11/19/16 0825  BP:  (!) 128/50    Pulse: 85 (!) 45    Resp: 18 16    Temp:  99.5 F (37.5 C)    TempSrc:  Oral    SpO2: 98% 97% 94% 90%  Weight:  Height:       No intake or output data in the 24 hours ending 11/19/16 0940 Filed Weights   11/17/16 2130 11/18/16 0311  Weight: 54.4  kg (120 lb) 54.6 kg (120 lb 6.4 oz)    Examination: General exam: Pleasant, elderly female in no distress, ambulating from bathroom without assistance. Respiratory system: Non-labored breathing 1L by [place]. Diffusely decreased with improved wheezing. Cardiovascular system: Regular with intermittent bigeminy with rate ~80bpm. II/VI early systolic murmur which was present on prior exams, no rub, or gallop. No JVD, and no pedal edema. Gastrointestinal system: Abdomen soft, non-tender, non-distended, with normoactive bowel sounds. No organomegaly or masses felt. Central nervous system: Alert and oriented. No focal neurological deficits. Extremities: Warm, no deformities Skin: No rashes, lesions no ulcers Psychiatry: Judgement and insight appear normal. Mood & affect appropriate.   Data Reviewed: I have personally reviewed following labs and imaging studies  CBC:  Recent Labs Lab 11/17/16 2309  WBC 6.5  NEUTROABS 5.3  HGB 12.0  HCT 35.8*  MCV 97.3  PLT 169   Radiology Studies: Dg Chest 2 View  Result Date: 11/17/2016 CLINICAL DATA:  Shortness of breath for 1 day EXAM: CHEST  2 VIEW COMPARISON:  11/17/2016, 03/01/2016 FINDINGS: The lungs are slightly hyperinflated. Probable nipple shadow at the left base. No acute consolidation or effusion. Stable borderline to mild cardiomegaly without overt failure. No pneumothorax. Bones appear osteopenic. Surgical clips in the upper abdomen. IMPRESSION: Hyperinflation without focal infiltrate. Stable borderline to mild cardiomegaly Electronically Signed   By: Jasmine PangKim  Fujinaga M.D.   On: 11/17/2016 23:19   Time spent: 25 minutes  Hazeline Junkeryan Roby Donaway, MD [facility] Pager [phone]  If 7PM-7AM, please contact night-coverage [email or web address] Password [email or web address] 11/19/2016, 9:40 AM

## 2016-11-20 ENCOUNTER — Inpatient Hospital Stay (HOSPITAL_COMMUNITY): Payer: Medicare Other

## 2016-11-20 DIAGNOSIS — I10 Essential (primary) hypertension: Secondary | ICD-10-CM | POA: Diagnosis present

## 2016-11-20 DIAGNOSIS — Z7982 Long term (current) use of aspirin: Secondary | ICD-10-CM | POA: Diagnosis not present

## 2016-11-20 DIAGNOSIS — J9600 Acute respiratory failure, unspecified whether with hypoxia or hypercapnia: Secondary | ICD-10-CM | POA: Diagnosis not present

## 2016-11-20 DIAGNOSIS — I350 Nonrheumatic aortic (valve) stenosis: Secondary | ICD-10-CM | POA: Diagnosis present

## 2016-11-20 DIAGNOSIS — E785 Hyperlipidemia, unspecified: Secondary | ICD-10-CM | POA: Diagnosis present

## 2016-11-20 DIAGNOSIS — R008 Other abnormalities of heart beat: Secondary | ICD-10-CM | POA: Diagnosis present

## 2016-11-20 DIAGNOSIS — J9601 Acute respiratory failure with hypoxia: Secondary | ICD-10-CM | POA: Diagnosis present

## 2016-11-20 DIAGNOSIS — Z79899 Other long term (current) drug therapy: Secondary | ICD-10-CM | POA: Diagnosis not present

## 2016-11-20 DIAGNOSIS — J111 Influenza due to unidentified influenza virus with other respiratory manifestations: Secondary | ICD-10-CM | POA: Diagnosis present

## 2016-11-20 DIAGNOSIS — F419 Anxiety disorder, unspecified: Secondary | ICD-10-CM | POA: Diagnosis present

## 2016-11-20 DIAGNOSIS — I739 Peripheral vascular disease, unspecified: Secondary | ICD-10-CM | POA: Diagnosis present

## 2016-11-20 DIAGNOSIS — R001 Bradycardia, unspecified: Secondary | ICD-10-CM | POA: Diagnosis present

## 2016-11-20 DIAGNOSIS — Z66 Do not resuscitate: Secondary | ICD-10-CM | POA: Diagnosis present

## 2016-11-20 DIAGNOSIS — J441 Chronic obstructive pulmonary disease with (acute) exacerbation: Secondary | ICD-10-CM | POA: Diagnosis present

## 2016-11-20 MED ORDER — METHYLPREDNISOLONE SODIUM SUCC 125 MG IJ SOLR
125.0000 mg | Freq: Two times a day (BID) | INTRAMUSCULAR | Status: DC
  Administered 2016-11-20 – 2016-11-21 (×3): 125 mg via INTRAVENOUS
  Filled 2016-11-20 (×3): qty 2

## 2016-11-20 NOTE — Progress Notes (Signed)
PROGRESS NOTE  Sydney Gross Soth  ZOX:[id number] DOB: 03/11/1930 DOA: 11/17/2016 PCP: Lupita RaiderSHAW,KIMBERLEE, MD  Outpatient Specialists: Rosine DoorJohn Clinger, ENT Lona Kettleobert Yeatts, othphalmology Verdis PrimeHenry Smith, cardiology Charisse MarchSuzanne Nickel, NP, vascular surgery  Brief Narrative: Sydney Gross Stoke is a [age] y.o. female with medical history significant of COPD, hypertension, hyperlipidemia, anxiety, PVD, CPPD, colitis, presented with cough and dyspnea in setting of URI symptoms. She presented to PCP for evaluation of cough productive of yellow sputum without fever and had abnormal x-ray for possible mass and therefore sent her to ED. On arrival she was dyspneic, saturating in 80%'s on room air. Afebrile with wheezes, speaking full sentences. WBC 6.5, negative troponin, electrolytes and renal function okay, no infiltrate on CXR, suspected nipple shadow at left base per radiologist. She was brought in for IV solumedrol, azithromycin, breathing treatments, and oxygen supplementation. Improvement has been slow, still having oxygen requirement. Influenza PCR positive. Attempt to wean to po steroids and wean oxygen has been unsuccessful with worsening of condition overnight 12/15. IV steroids were reordered and repeat CXR is performed.   Assessment & Plan: Principal Problem:   COPD exacerbation (HCC) Active Problems:   Hypertension   Acute respiratory failure with hypoxia (HCC)   PAD (peripheral artery disease) (HCC)   Influenza   Acute respiratory failure (HCC)  Acute hypoxemic respiratory failure due to COPD exacerbation due to influenza: Patient has productive cough, shortness of breath and wheezing on auscultation, consistent with COPD exacerbation sparked by flu.No h/o tobacco use and reports COPD was diagnosed by PFTs remotely. No pulmonologist. Flu PCR+.  - Continue scheduled Duoneb and prn albuterol - Transition to po prednisone 12/15 due to wheezing improving >> worsening overnight >> back to solumedrol OV  12/16. - Continue azithromycin for 5 days (12/13 > 12/17)  - Continue tamiflu 75 mg BID x5 days. (12/14 > 12/18) - Follow up sputum culture - Consider outpatient PFT's and/or pulmonology referral - Checked troponin x2 due to pleuritic chest pain with cough, both negative. No ACS.  - Repeat CXR  Hypertension: Chronic, stable. - Continue metoprolol, amlodipine, Prinzide - Hydralazine IV when necessary  Hx of PAD (peripheral artery disease): no signs of ischemia in her lower extremities. Recent ABI's are stable compared to a year ago. She is continuing her walking and exercising. - Continue ASA and cilostazol - Continue graduated walking program.   Supravalvular aortic stenosis with subacute/chronic history of dyspnea on exertion followed by Dr. Katrinka BlazingSmith. Most recent echo shows valvular sclerosis without stenosis.  - No intervention planned.   Ventricular bigeminy: Suspect related to acute illness, asymptomatic. No sustained ventricular beats or pauses.  - Monitor on telemetry   Abnormal CXR: X-ray in PCP's office showed possible mass, which is not seen on the repeated CXR in ED, but showed possible nipple shadow at left base per radiologist.  - Recommend repeat CXR as outpatient following acute illness  DVT prophylaxis: Lovenox Code Status: Full Family Communication: None at bedside Disposition Plan: Continue telemetry, will require inpatient care for acute respiratory failure refractory/COPD exacerbation due to flu requiring IV steroids. Would be discharged home where there are multiple sick contacts and limited support, which would put at risk for reinfection/readmission.   Consultants:   None  Procedures:   None  Antimicrobials:  Azithromycin 12/13 >>   Tamiflu 12/14 >>   Subjective: Worsened overnight with significant wheezing, hacking cough and dyspnea worsening. Has stable chest pain with cough, none with exertion. Husband's birthday today, he is home with nausea,  vomiting, diarrhea,  productive cough and fever.   Objective: Vitals:   11/19/16 2109 11/20/16 0514 11/20/16 0532 11/20/16 0738  BP: 132/73 (!) 141/74    Pulse: 62 92    Resp: 18 20    Temp: 98.6 F (37 C) 98.7 F (37.1 C)    TempSrc: Oral Oral    SpO2: 99% 92% 93% 100%  Weight:      Height:        Intake/Output Summary (Last 24 hours) at 11/20/16 0852 Last data filed at 11/20/16 0514  Gross per 24 hour  Intake              480 ml  Output                0 ml  Net              480 ml   Filed Weights   11/17/16 2130 11/18/16 0311  Weight: 54.4 kg (120 lb) 54.6 kg (120 lb 6.4 oz)    Examination: General exam: Pleasant, elderly female in no distress, ambulating from bathroom without assistance. Respiratory system: Non-labored breathing 1L by [place]. Diffusely decreased and L>R pan-expiratory wheezing with prolonged expiration. Some rhonchi that clear with cough. Cardiovascular system: Regular with intermittent bigeminy with rate ~80bpm. II/VI early systolic murmur stable from prior exams, no rub, or gallop. No JVD, and no pedal edema. Gastrointestinal system: Abdomen soft, non-tender, non-distended, with normoactive bowel sounds. No organomegaly or masses felt. Central nervous system: Alert and oriented. No focal neurological deficits. Extremities: Warm, no deformities Skin: No rashes, lesions no ulcers Psychiatry: Judgement and insight appear normal. Mood & affect appropriate.   Data Reviewed: I have personally reviewed following labs and imaging studies  CBC:  Recent Labs Lab 11/17/16 2309  WBC 6.5  NEUTROABS 5.3  HGB 12.0  HCT 35.8*  MCV 97.3  PLT 169   Radiology Studies: No results found. Time spent: 25 minutes  Hazeline Junkeryan Latorsha Curling, MD [facility] Pager [phone]  If 7PM-7AM, please contact night-coverage [email or web address] Password [email or web address] 11/20/2016, 8:52 AM

## 2016-11-21 LAB — CBC
HCT: 34 % — ABNORMAL LOW (ref 36.0–46.0)
Hemoglobin: 11.2 g/dL — ABNORMAL LOW (ref 12.0–15.0)
MCH: 32.5 pg (ref 26.0–34.0)
MCHC: 32.9 g/dL (ref 30.0–36.0)
MCV: 98.6 fL (ref 78.0–100.0)
Platelets: 222 10*3/uL (ref 150–400)
RBC: 3.45 MIL/uL — ABNORMAL LOW (ref 3.87–5.11)
RDW: 13.3 % (ref 11.5–15.5)
WBC: 10.9 10*3/uL — ABNORMAL HIGH (ref 4.0–10.5)

## 2016-11-21 LAB — BASIC METABOLIC PANEL WITH GFR
Anion gap: 7 (ref 5–15)
BUN: 33 mg/dL — ABNORMAL HIGH (ref 6–20)
CO2: 26 mmol/L (ref 22–32)
Calcium: 9.1 mg/dL (ref 8.9–10.3)
Chloride: 106 mmol/L (ref 101–111)
Creatinine, Ser: 0.77 mg/dL (ref 0.44–1.00)
GFR calc Af Amer: >60 mL/min
GFR calc non Af Amer: >60 mL/min
Glucose, Bld: 139 mg/dL — ABNORMAL HIGH (ref 65–99)
Potassium: 4.4 mmol/L (ref 3.5–5.1)
Sodium: 139 mmol/L (ref 135–145)

## 2016-11-21 MED ORDER — PREDNISONE 20 MG PO TABS: 40.0000 mg | ORAL_TABLET | Freq: Every day | ORAL | 0 refills | Status: AC

## 2016-11-21 MED ORDER — OSELTAMIVIR PHOSPHATE 75 MG PO CAPS: 75.0000 mg | ORAL_CAPSULE | Freq: Two times a day (BID) | ORAL | 0 refills | Status: DC

## 2016-11-21 MED ORDER — ALBUTEROL SULFATE (2.5 MG/3ML) 0.083% IN NEBU: 2.5000 mg | INHALATION_SOLUTION | RESPIRATORY_TRACT | 0 refills | Status: DC | PRN

## 2016-11-21 MED ORDER — OSELTAMIVIR PHOSPHATE 30 MG PO CAPS: 30.0000 mg | ORAL_CAPSULE | Freq: Two times a day (BID) | ORAL | 0 refills | Status: DC

## 2016-11-21 NOTE — Discharge Summary (Signed)
Physician Discharge Summary  Sydney Gross ZJQ:[id number] DOB: 1930-11-23 DOA: 11/17/2016  PCP: Lupita Raider, MD  Admit date: 11/17/2016 Discharge date: 11/21/2016  Admitted From: Home Disposition: Home   Recommendations for Outpatient Follow-up:  1. Follow up with PCP in 1 week 2. Consider repeat CXR to evaluate possible mass seen on CXR PTA, though this was not seen on subsequent chest radiography.  3. Consider referral to cardiology if ventricular bigeminy continues outside scope of current illness.  4. Consider formal PFTs if these have not been performed recently.   Home Health: None Equipment/Devices: Nebulizer, did not require or qualify for home oxygen.   Discharge Condition: Stable  CODE STATUS: DNR Diet recommendation: Heart healthy  Brief/Interim Summary: Rosine Door, ENT Lona Kettle, othphalmology [name], cardiology Charisse March, NP, vascular surgery  Brief Narrative: Alessa Mazur Gusleris a 82 y.o.femalewith medical history significant of COPD, hypertension, hyperlipidemia, anxiety, PVD, CPPD, colitis, presented with cough and dyspnea in setting of URI symptoms. She presented to PCP for evaluation of cough productive of yellow sputum without fever and had abnormal x-ray for possible mass and therefore sent her to ED. On arrival she was dyspneic, saturating in 80%'s on room air. Afebrile with wheezes, speaking full sentences. WBC 6.5, negative troponin, electrolytes and renal function okay, no infiltrate on CXR, suspected nipple shadow at left base per radiologist. She was brought in for IV solumedrol, azithromycin, breathing treatments, and oxygen supplementation. Improvement has been slow, still having oxygen requirement. Influenza PCR positive. Attempt to wean to po steroids and wean oxygen was unsuccessful with worsening of condition overnight 12/15. IV steroids were reordered and repeat CXR was stable. Her condition improved significantly 12/17 and she  is discharged to continue 5 further days of prednisone and to administer nebulizer treatments at home (arranged by care management at discharge).   Discharge Diagnoses:  Principal Problem:   COPD exacerbation (HCC) Active Problems:   Hypertension   Acute respiratory failure with hypoxia (HCC)   PAD (peripheral artery disease) (HCC)   Influenza   Acute respiratory failure (HCC)  Acute hypoxemic respiratory failure due to COPD exacerbation due to influenza:Patient has productive cough, shortness of breath and wheezing on auscultation, consistent with COPD exacerbation sparked by flu.No h/o tobacco use and reports COPD was diagnosed by PFTs remotely. No pulmonologist. Flu PCR+.  - Continue scheduled Duoneb and prn albuterol - Complete 10 days of steroids at discharge. - Continue azithromycin for 5 days (12/13 > 12/17)  - Continue tamiflu 75 mg BID x5 days. (12/14 > 12/18) - Consider outpatient PFT's and/or pulmonology referral - Checked troponin x2 due to pleuritic chest pain with cough, both negative. No ACS.   Hypertension:Chronic, stable. - Continue metoprolol, amlodipine, Prinzide - Hydralazine IV when necessary  Hx ofPAD (peripheral artery disease): no signs of ischemia in her lower extremities. Recent ABI's are stable compared to a year ago. She is continuing her walking and exercising. - Continue ASA and cilostazol - Continue graduated walking program.   Supravalvular aortic stenosis with subacute/chronic history of dyspnea on exertion followed by Dr. Katrinka Blazing. Most recent echo shows valvular sclerosis without stenosis.  - No intervention planned.   Ventricular bigeminy: Suspect related to acute illness, asymptomatic. No sustained ventricular beats or pauses.  - Monitor on telemetry  - Consider cardiology evaluation  Abnormal CXR: X-ray in PCP's office showed possible mass, which is not seen on the repeated CXR in ED, but showed possible nipple shadow at left base per  radiologist.  - Recommend repeat  CXR as outpatient following acute illness  Discharge Instructions Discharge Instructions    Call MD for:  difficulty breathing, headache or visual disturbances    Complete by:  As directed    Discharge instructions    Complete by:  As directed    You were admitted with a COPD exacerbation due to the flu. You were treated for the flu with tamiflu and will continue this for 1 more day, and treated for the COPD with azithromycin, which you have completed and do not need to take any more, and steroids which will still help you and will be prescribed for 4 more days.  - Take tamiflu starting this evening twice daily x 3 pills - Take a taper of prednisone as follows: 40mg  (2 tablets) every morning for 2 days starting tomorrow morning, then take 20mg  (1 tablet) every morning for 2 days.  - Use the nebulizer machine to take albuterol every 6 hours for the next 2-3 days. You can also take it as needed in addition to this every 3 hours.  - You are not contagious at this point, but should continue universal precautions such as washing your hands, avoiding immunocompromised contacts or very young babies, and covering cough.  - Please schedule a follow up appointment with your doctor sometime this week for reevaluation, or return for care earlier if symptoms return or worsen.   It was a pleasure getting to know you. Take care! - Dr. Jarvis Newcomer   Increase activity slowly    Complete by:  As directed      Allergies as of 11/21/2016      Reactions   Codeine    GI upset   Crestor [rosuvastatin] Other (See Comments)   MYALGIAS   Darvon [propoxyphene] Nausea Only   Lipitor [atorvastatin] Other (See Comments)   MYALGIAS   Zocor [simvastatin] Other (See Comments)   MYALGIAS      Medication List    TAKE these medications   acetaminophen 500 MG tablet Commonly known as:  TYLENOL Take 500 mg by mouth every 6 (six) hours as needed for moderate pain.   albuterol (2.5  MG/3ML) 0.083% nebulizer solution Commonly known as:  PROVENTIL Take 3 mLs (2.5 mg total) by nebulization every 4 (four) hours as needed for wheezing or shortness of breath.   amLODipine 5 MG tablet Commonly known as:  NORVASC Take 1 tablet (5 mg total) by mouth daily.   aspirin EC 81 MG tablet Take 81 mg by mouth every evening.   cilostazol 50 MG tablet Commonly known as:  PLETAL Take 50 mg by mouth 2 (two) times daily.   diazepam 5 MG tablet Commonly known as:  VALIUM Take 0.5 tablets (2.5 mg total) by mouth every 6 (six) hours as needed for muscle spasms.   diphenhydramine-acetaminophen 25-500 MG Tabs tablet Commonly known as:  TYLENOL PM Take 1 tablet by mouth at bedtime as needed (pain/sleep).   lisinopril-hydrochlorothiazide 20-25 MG tablet Commonly known as:  PRINZIDE,ZESTORETIC Take half (1/2) tablet by mouth daily every evening.   metoprolol tartrate 25 MG tablet Commonly known as:  LOPRESSOR TAKE ONE-HALF TABLET BY MOUTH TWICE DAILY   ondansetron 4 MG tablet Commonly known as:  ZOFRAN Take 1 tablet (4 mg total) by mouth every 8 (eight) hours as needed for nausea or vomiting.   oseltamivir 30 MG capsule Commonly known as:  TAMIFLU Take 1 capsule (30 mg total) by mouth 2 (two) times daily.   predniSONE 20 MG tablet Commonly known as:  DELTASONE Take 2 tablets (40 mg total) by mouth daily with breakfast.   SYMBICORT 160-4.5 MCG/ACT inhaler Generic drug:  budesonide-formoterol Inhale 2 puffs into the lungs 2 (two) times daily.   VITAMIN D PO Take 1 capsule by mouth every evening.            Durable Medical Equipment        Start     Ordered   11/21/16 [phone]  For home use only DME Nebulizer machine  Once    Question:  Patient needs a nebulizer to treat with the following condition  Answer:  COPD (chronic obstructive pulmonary disease) (HCC)   11/21/16 0906      Allergies  Allergen Reactions  . Codeine     GI upset  . Crestor [Rosuvastatin]  Other (See Comments)    MYALGIAS  . Darvon [Propoxyphene] Nausea Only  . Lipitor [Atorvastatin] Other (See Comments)    MYALGIAS  . Zocor [Simvastatin] Other (See Comments)    MYALGIAS    Consultations:  None  Procedures/Studies: Dg Chest 2 View  Result Date: 11/20/2016 CLINICAL DATA:  Acute respiratory failure, COPD EXAM: CHEST  2 VIEW COMPARISON:  11/17/2016 FINDINGS: There is hyperinflation of the lungs compatible with COPD. Mild cardiomegaly. No confluent airspace opacities or effusions. IMPRESSION: COPD.  Cardiomegaly.  No active disease. Electronically Signed   By: Charlett NoseKevin  Dover M.D.   On: 11/20/2016 09:47   Dg Chest 2 View  Result Date: 11/17/2016 CLINICAL DATA:  Shortness of breath for 1 day EXAM: CHEST  2 VIEW COMPARISON:  11/17/2016, 03/01/2016 FINDINGS: The lungs are slightly hyperinflated. Probable nipple shadow at the left base. No acute consolidation or effusion. Stable borderline to mild cardiomegaly without overt failure. No pneumothorax. Bones appear osteopenic. Surgical clips in the upper abdomen. IMPRESSION: Hyperinflation without focal infiltrate. Stable borderline to mild cardiomegaly Electronically Signed   By: Jasmine PangKim  Fujinaga M.D.   On: 11/17/2016 23:19     Subjective: Pt reports she feels much better. Ambulated in the halls without oxygen or dyspnea. Cough improving. Still wheezing intermittently, resolved after breathing treatments. Wants to go home. No chest pain.   Discharge Exam: Vitals:   11/20/16 2125 11/21/16 0625  BP: (!) 145/64 132/66  Pulse: (!) 50 (!) 47  Resp: 18 18  Temp: 98.8 F (37.1 C) 98.9 F (37.2 C)   Vitals:   11/20/16 1434 11/20/16 2125 11/21/16 0625 11/21/16 0747  BP:  (!) 145/64 132/66   Pulse:  (!) 50 (!) 47   Resp:  18 18   Temp:  98.8 F (37.1 C) 98.9 F (37.2 C)   TempSrc:  Oral Oral   SpO2: 98% 98% 99% 98%  Weight:      Height:       General: Pt is alert, awake, not in acute distress Cardiovascular: RRR, S1/S2 +, no  rubs, no gallops Respiratory: Nonlaobred on room air with diffuse end-expiratory wheezing without prolongation. Good air movement throughout.  Abdominal: Soft, NT, ND, bowel sounds + Extremities: no edema, no cyanosis  The results of significant diagnostics from this hospitalization (including imaging, microbiology, ancillary and laboratory) are listed below for reference.    Microbiology: Recent Results (from the past 240 hour(s))  Respiratory Panel by PCR     Status: Abnormal   Collection Time: 11/18/16 10:20 AM  Result Value Ref Range Status   Adenovirus NOT DETECTED NOT DETECTED Final   Coronavirus 229E NOT DETECTED NOT DETECTED Final   Coronavirus HKU1 NOT DETECTED NOT DETECTED  Final   Coronavirus NL63 NOT DETECTED NOT DETECTED Final   Coronavirus OC43 NOT DETECTED NOT DETECTED Final   Metapneumovirus NOT DETECTED NOT DETECTED Final   Rhinovirus / Enterovirus NOT DETECTED NOT DETECTED Final   Influenza A H3 DETECTED (A) NOT DETECTED Final   Influenza B NOT DETECTED NOT DETECTED Final   Parainfluenza Virus 1 NOT DETECTED NOT DETECTED Final   Parainfluenza Virus 2 NOT DETECTED NOT DETECTED Final   Parainfluenza Virus 3 NOT DETECTED NOT DETECTED Final   Parainfluenza Virus 4 NOT DETECTED NOT DETECTED Final   Respiratory Syncytial Virus NOT DETECTED NOT DETECTED Final   Bordetella pertussis NOT DETECTED NOT DETECTED Final   Chlamydophila pneumoniae NOT DETECTED NOT DETECTED Final   Mycoplasma pneumoniae NOT DETECTED NOT DETECTED Final    Comment: Performed at [facility]     Labs: BNP (last 3 results) No results for input(s): BNP in the last 8760 hours. Basic Metabolic Panel:  Recent Labs Lab 11/17/16 2309 11/21/16 0517  NA 137 139  K 3.7 4.4  CL 104 106  CO2 25 26  GLUCOSE 138* 139*  BUN 23* 33*  CREATININE 0.83 0.77  CALCIUM 9.6 9.1   Liver Function Tests:  Recent Labs Lab 11/17/16 2309  AST 23  ALT 16  ALKPHOS 58  BILITOT 0.9  PROT 7.0  ALBUMIN  4.3   No results for input(s): LIPASE, AMYLASE in the last 168 hours. No results for input(s): AMMONIA in the last 168 hours. CBC:  Recent Labs Lab 11/17/16 2309 11/21/16 0517  WBC 6.5 10.9*  NEUTROABS 5.3  --   HGB 12.0 11.2*  HCT 35.8* 34.0*  MCV 97.3 98.6  PLT 169 222   Cardiac Enzymes:  Recent Labs Lab 11/19/16 1003  TROPONINI <0.03   BNP: Invalid input(s): POCBNP CBG: No results for input(s): GLUCAP in the last 168 hours. D-Dimer No results for input(s): DDIMER in the last 72 hours. Hgb A1c No results for input(s): HGBA1C in the last 72 hours. Lipid Profile No results for input(s): CHOL, HDL, LDLCALC, TRIG, CHOLHDL, LDLDIRECT in the last 72 hours. Thyroid function studies No results for input(s): TSH, T4TOTAL, T3FREE, THYROIDAB in the last 72 hours.  Invalid input(s): FREET3 Anemia work up No results for input(s): VITAMINB12, FOLATE, FERRITIN, TIBC, IRON, RETICCTPCT in the last 72 hours. Urinalysis    Component Value Date/Time   COLORURINE ORANGE (A) 10/29/2015 1955   APPEARANCEUR CLOUDY (A) 10/29/2015 1955   LABSPEC 1.012 10/29/2015 1955   PHURINE 6.0 10/29/2015 1955   GLUCOSEU NEGATIVE 10/29/2015 1955   HGBUR NEGATIVE 10/29/2015 1955   BILIRUBINUR NEGATIVE 10/29/2015 1955   KETONESUR NEGATIVE 10/29/2015 1955   PROTEINUR 30 (A) 10/29/2015 1955   UROBILINOGEN 0.2 12/09/2013 1902   NITRITE NEGATIVE 10/29/2015 1955   LEUKOCYTESUR TRACE (A) 10/29/2015 1955   Sepsis Labs Invalid input(s): PROCALCITONIN,  WBC,  LACTICIDVEN Microbiology Recent Results (from the past 240 hour(s))  Respiratory Panel by PCR     Status: Abnormal   Collection Time: 11/18/16 10:20 AM  Result Value Ref Range Status   Adenovirus NOT DETECTED NOT DETECTED Final   Coronavirus 229E NOT DETECTED NOT DETECTED Final   Coronavirus HKU1 NOT DETECTED NOT DETECTED Final   Coronavirus NL63 NOT DETECTED NOT DETECTED Final   Coronavirus OC43 NOT DETECTED NOT DETECTED Final    Metapneumovirus NOT DETECTED NOT DETECTED Final   Rhinovirus / Enterovirus NOT DETECTED NOT DETECTED Final   Influenza A H3 DETECTED (A) NOT DETECTED Final  Influenza B NOT DETECTED NOT DETECTED Final   Parainfluenza Virus 1 NOT DETECTED NOT DETECTED Final   Parainfluenza Virus 2 NOT DETECTED NOT DETECTED Final   Parainfluenza Virus 3 NOT DETECTED NOT DETECTED Final   Parainfluenza Virus 4 NOT DETECTED NOT DETECTED Final   Respiratory Syncytial Virus NOT DETECTED NOT DETECTED Final   Bordetella pertussis NOT DETECTED NOT DETECTED Final   Chlamydophila pneumoniae NOT DETECTED NOT DETECTED Final   Mycoplasma pneumoniae NOT DETECTED NOT DETECTED Final    Comment[facility]    Time coordinating discharge: Over 30 minutes  Hazeline Junkeryan Constanza Mincy, MD  [facility] 11/21/2016, 9:17 AM Pager [phone]  If 7PM-7AM, please contact night-coverage [email or web address] Password [email or web address]

## 2016-11-21 NOTE — Progress Notes (Signed)
SATURATION QUALIFICATIONS: (This note is used to comply with regulatory documentation for home oxygen)  Patient Saturations on Room Air at Rest = 98%  Patient Saturations on Room Air while Ambulating = 90-93%  Please briefly explain why patient needs home oxygen: pt does not need oxygen at home

## 2016-11-21 NOTE — Progress Notes (Signed)
Reviewed discharge information with patient. Answered all questions. Patient able to teach back medications and reasons to contact MD/911. Patient verbalizes importance of PCP follow up appointment.  Arien Benincasa M. Rusty Glodowski, RN   

## 2016-11-24 DIAGNOSIS — J440 Chronic obstructive pulmonary disease with acute lower respiratory infection: Secondary | ICD-10-CM | POA: Diagnosis not present

## 2016-12-07 DIAGNOSIS — I10 Essential (primary) hypertension: Secondary | ICD-10-CM | POA: Diagnosis not present

## 2016-12-07 DIAGNOSIS — Z23 Encounter for immunization: Secondary | ICD-10-CM | POA: Diagnosis not present

## 2016-12-07 DIAGNOSIS — J111 Influenza due to unidentified influenza virus with other respiratory manifestations: Secondary | ICD-10-CM | POA: Diagnosis not present

## 2016-12-07 DIAGNOSIS — J9691 Respiratory failure, unspecified with hypoxia: Secondary | ICD-10-CM | POA: Diagnosis not present

## 2016-12-07 DIAGNOSIS — E782 Mixed hyperlipidemia: Secondary | ICD-10-CM | POA: Diagnosis not present

## 2016-12-07 DIAGNOSIS — I739 Peripheral vascular disease, unspecified: Secondary | ICD-10-CM | POA: Diagnosis not present

## 2016-12-07 DIAGNOSIS — J441 Chronic obstructive pulmonary disease with (acute) exacerbation: Secondary | ICD-10-CM | POA: Diagnosis not present

## 2016-12-07 DIAGNOSIS — J449 Chronic obstructive pulmonary disease, unspecified: Secondary | ICD-10-CM | POA: Diagnosis not present

## 2017-01-10 ENCOUNTER — Other Ambulatory Visit: Payer: Self-pay | Admitting: Interventional Cardiology

## 2017-01-11 DIAGNOSIS — I10 Essential (primary) hypertension: Secondary | ICD-10-CM | POA: Diagnosis not present

## 2017-01-11 DIAGNOSIS — Z9841 Cataract extraction status, right eye: Secondary | ICD-10-CM | POA: Diagnosis not present

## 2017-01-11 DIAGNOSIS — Z9842 Cataract extraction status, left eye: Secondary | ICD-10-CM | POA: Diagnosis not present

## 2017-01-11 DIAGNOSIS — Z888 Allergy status to other drugs, medicaments and biological substances status: Secondary | ICD-10-CM | POA: Diagnosis not present

## 2017-01-11 DIAGNOSIS — H04129 Dry eye syndrome of unspecified lacrimal gland: Secondary | ICD-10-CM | POA: Diagnosis not present

## 2017-01-11 DIAGNOSIS — J449 Chronic obstructive pulmonary disease, unspecified: Secondary | ICD-10-CM | POA: Diagnosis not present

## 2017-01-11 DIAGNOSIS — Z885 Allergy status to narcotic agent status: Secondary | ICD-10-CM | POA: Diagnosis not present

## 2017-01-11 DIAGNOSIS — Z79899 Other long term (current) drug therapy: Secondary | ICD-10-CM | POA: Diagnosis not present

## 2017-01-11 DIAGNOSIS — H04223 Epiphora due to insufficient drainage, bilateral lacrimal glands: Secondary | ICD-10-CM | POA: Diagnosis not present

## 2017-01-11 DIAGNOSIS — H04543 Stenosis of bilateral lacrimal canaliculi: Secondary | ICD-10-CM | POA: Diagnosis not present

## 2017-02-07 DIAGNOSIS — H539 Unspecified visual disturbance: Secondary | ICD-10-CM | POA: Diagnosis not present

## 2017-02-07 DIAGNOSIS — H04223 Epiphora due to insufficient drainage, bilateral lacrimal glands: Secondary | ICD-10-CM | POA: Diagnosis not present

## 2017-02-07 DIAGNOSIS — Z961 Presence of intraocular lens: Secondary | ICD-10-CM | POA: Diagnosis not present

## 2017-02-07 DIAGNOSIS — H02423 Myogenic ptosis of bilateral eyelids: Secondary | ICD-10-CM | POA: Diagnosis not present

## 2017-02-12 ENCOUNTER — Other Ambulatory Visit: Payer: Self-pay | Admitting: Interventional Cardiology

## 2017-02-16 ENCOUNTER — Other Ambulatory Visit: Payer: Self-pay | Admitting: Interventional Cardiology

## 2017-02-17 NOTE — Telephone Encounter (Signed)
Medication Detail    Disp Refills Start End   metoprolol tartrate (LOPRESSOR) 25 MG tablet 15 tablet 0 02/15/2017    Sig: TAKE ONE-HALF TABLET BY MOUTH TWICE DAILY   Notes to Pharmacy: PLEASE ASK PATIENT TO SCHEDULE AN APPT W/DR Katrinka BlazingSMITH [phone]   E-Prescribing Status: Receipt confirmed by pharmacy (02/15/2017 1:39 PM EDT)   [id number] [email or web address]

## 2017-02-18 ENCOUNTER — Telehealth: Payer: Self-pay | Admitting: Interventional Cardiology

## 2017-02-18 NOTE — Telephone Encounter (Signed)
Spoke with Lissa HoardSonia and made her aware that we did not receive form.  Verified fax number.  She will send again.

## 2017-02-18 NOTE — Telephone Encounter (Signed)
Follow Up  Following up on fax sent last week requesting to stop ASA 1 week prior to surgery and to stop Pletal 5 days prior to surgery. States surgery is scheduled for 02/28/17. Please call.

## 2017-02-21 ENCOUNTER — Telehealth: Payer: Self-pay | Admitting: Interventional Cardiology

## 2017-02-21 NOTE — Telephone Encounter (Signed)
Follow Up:   She wants to know if her fax was received that she faxed on 02-18-17 at 3:30? She needs this back asap,surgery is scheduled for Friday,She needs to know if pt can stop his aspirin Pletal today? She needs the fax back today asap please.

## 2017-02-21 NOTE — Telephone Encounter (Signed)
I placed call to call back number and received message call would not go through. I called number listed on fax [phone]) and was transferred to [employer] Corporation[employer]ona's voicemail.  I left message that fax was sent earlier and that I had refaxed it to [phone].  Left message to call office if not received.  I did receive confirmation that fax was received.

## 2017-02-28 ENCOUNTER — Other Ambulatory Visit: Payer: Self-pay | Admitting: Interventional Cardiology

## 2017-03-10 DIAGNOSIS — H02423 Myogenic ptosis of bilateral eyelids: Secondary | ICD-10-CM | POA: Diagnosis not present

## 2017-03-10 DIAGNOSIS — Z961 Presence of intraocular lens: Secondary | ICD-10-CM | POA: Diagnosis not present

## 2017-03-10 DIAGNOSIS — H52203 Unspecified astigmatism, bilateral: Secondary | ICD-10-CM | POA: Diagnosis not present

## 2017-03-10 DIAGNOSIS — H04223 Epiphora due to insufficient drainage, bilateral lacrimal glands: Secondary | ICD-10-CM | POA: Diagnosis not present

## 2017-04-07 DIAGNOSIS — J449 Chronic obstructive pulmonary disease, unspecified: Secondary | ICD-10-CM | POA: Diagnosis not present

## 2017-04-19 NOTE — Progress Notes (Deleted)
Cardiology Office Note    Date:  04/19/2017   ID:  Sydney Gross, DOB 1929-12-28, MRN [id number]  PCP:  Sydney Raider, MD  Cardiologist: Lesleigh Noe, MD   No chief complaint on file.   History of Present Illness:  Sydney Gross is a 83 y.o. female  who presents for  Supravalvular aortic stenosis, hypertension, diastolic heart failure (stable) , an chronic dyspnea.     Past Medical History:  Diagnosis Date  . Allergic rhinitis   . Arthritis    "maybe a little in my knees and back" (12/10/2013)  . Colitis 12/2013   "never had this before"  . COPD (chronic obstructive pulmonary disease) (HCC)   . DOE (dyspnea on exertion)   . Exertional shortness of breath    "off and on" (12/10/2013)  . Family history of anesthesia complication    "mother; think they gave her too much; c/o pain after OR; gave her more RX; they had to bring her back real quick"  . H/O calcium pyrophosphate deposition disease (CPPD)   . Hiatal hernia   . Hyperlipidemia   . Hypertension   . PAD (peripheral artery disease) (HCC)   . Proteinuria   . Shingles   . Stenosis of tear duct     Past Surgical History:  Procedure Laterality Date  . ABDOMINAL HYSTERECTOMY  1960's  . BACK SURGERY    . CESAREAN SECTION  1957; 1960  . CHOLECYSTECTOMY  ~ 2009  . LUMBAR DISC SURGERY  ?2004  . TONSILLECTOMY AND ADENOIDECTOMY  ?1940's    Current Medications: Outpatient Medications Prior to Visit  Medication Sig Dispense Refill  . acetaminophen (TYLENOL) 500 MG tablet Take 500 mg by mouth every 6 (six) hours as needed for moderate pain.    [name] albuterol (PROVENTIL) (2.5 MG/3ML) 0.083% nebulizer solution Take 3 mLs (2.5 mg total) by nebulization every 4 (four) hours as needed for wheezing or shortness of breath. 75 mL 0  . amLODipine (NORVASC) 5 MG tablet Take 1 tablet (5 mg total) by mouth daily. 30 tablet 0  . aspirin EC 81 MG tablet Take 81 mg by mouth every evening.     . budesonide-formoterol  (SYMBICORT) 160-4.5 MCG/ACT inhaler Inhale 2 puffs into the lungs 2 (two) times daily.    . Cholecalciferol (VITAMIN D PO) Take 1 capsule by mouth every evening.    . cilostazol (PLETAL) 50 MG tablet Take 50 mg by mouth 2 (two) times daily.   6  . diazepam (VALIUM) 5 MG tablet Take 0.5 tablets (2.5 mg total) by mouth every 6 (six) hours as needed for muscle spasms. 10 tablet 0  . diphenhydramine-acetaminophen (TYLENOL PM) 25-500 MG TABS Take 1 tablet by mouth at bedtime as needed (pain/sleep).     [name] lisinopril-hydrochlorothiazide (PRINZIDE,ZESTORETIC) 20-25 MG tablet Take half (1/2) tablet by mouth daily every evening.    . metoprolol tartrate (LOPRESSOR) 25 MG tablet Take 0.5 tablets (12.5 mg total) by mouth 2 (two) times daily. *Please keep May appointment for further refills* 30 tablet 1  . ondansetron (ZOFRAN) 4 MG tablet Take 1 tablet (4 mg total) by mouth every 8 (eight) hours as needed for nausea or vomiting. (Patient not taking: Reported on 11/17/2016) 11 tablet 0  . oseltamivir (TAMIFLU) 75 MG capsule Take 1 capsule (75 mg total) by mouth 2 (two) times daily. 2 capsule 0   No facility-administered medications prior to visit.      Allergies:   Codeine; Crestor [rosuvastatin];  Darvon [propoxyphene]; Lipitor [atorvastatin]; and Zocor [simvastatin]   Social History   Social History  . Marital status: Married    Spouse name: N/A  . Number of children: N/A  . Years of education: N/A   Social History Main Topics  . Smoking status: Never Smoker  . Smokeless tobacco: Never Used  . Alcohol use 2.4 oz/week    4 Glasses of wine per week     Comment: glass of wine every other night  . Drug use: No  . Sexual activity: No   Other Topics Concern  . Not on file   Social History Narrative  . No narrative on file     Family History:  The patient's ***family history includes CVA in her mother; Cancer in her brother; Emphysema (age of onset: 5369) in her father.   ROS:   Please see the  history of present illness.    ***  All other systems reviewed and are negative.   PHYSICAL EXAM:   VS:  There were no vitals taken for this visit.   GEN: Well nourished, well developed, in no acute distress  HEENT: normal  Neck: no JVD, carotid bruits, or masses Cardiac: ***RRR; no murmurs, rubs, or gallops,no edema  Respiratory:  clear to auscultation bilaterally, normal work of breathing GI: soft, nontender, nondistended, + BS MS: no deformity or atrophy  Skin: warm and dry, no rash Neuro:  Alert and Oriented x 3, Strength and sensation are intact Psych: euthymic mood, full affect  Wt Readings from Last 3 Encounters:  11/18/16 120 lb 6.4 oz (54.6 kg)  08/06/16 123 lb (55.8 kg)  01/14/16 123 lb 3.2 oz (55.9 kg)      Studies/Labs Reviewed:   EKG:  EKG  ***  Recent Labs: 11/17/2016: ALT 16 11/21/2016: BUN 33; Creatinine, Ser 0.77; Hemoglobin 11.2; Platelets 222; Potassium 4.4; Sodium 139   Lipid Panel No results found for: CHOL, TRIG, HDL, CHOLHDL, VLDL, LDLCALC, LDLDIRECT  Additional studies/ records that were reviewed today include:  ***    ASSESSMENT:    1. Aortic stenosis, supravalvular   2. Dilated aortic root (HCC)   3. Essential hypertension   4. PAD (peripheral artery disease) (HCC)   5. Atherosclerosis of native artery of right lower extremity with intermittent claudication (HCC)   6. COPD exacerbation (HCC)      PLAN:  In order of problems listed above:  1. ***    Medication Adjustments/Labs and Tests Ordered: Current medicines are reviewed at length with the patient today.  Concerns regarding medicines are outlined above.  Medication changes, Labs and Tests ordered today are listed in the Patient Instructions below. There are no Patient Instructions on file for this visit.   Signed, Lesleigh NoeHenry W Albaro Deviney III, MD  04/19/2017 7:04 PM    [facility] [address], [place], [place]  [address] Phone: [phone]; Fax: [phone]  [phone]

## 2017-04-20 ENCOUNTER — Ambulatory Visit: Payer: Medicare Other | Admitting: Interventional Cardiology

## 2017-04-26 ENCOUNTER — Telehealth: Payer: Self-pay | Admitting: Interventional Cardiology

## 2017-04-26 ENCOUNTER — Ambulatory Visit (INDEPENDENT_AMBULATORY_CARE_PROVIDER_SITE_OTHER): Payer: Medicare Other | Admitting: Nurse Practitioner

## 2017-04-26 ENCOUNTER — Encounter: Payer: Self-pay | Admitting: Nurse Practitioner

## 2017-04-26 VITALS — BP 136/80 | HR 68 | Ht <= 58 in | Wt 120.8 lb

## 2017-04-26 DIAGNOSIS — Q253 Supravalvular aortic stenosis: Secondary | ICD-10-CM

## 2017-04-26 NOTE — Telephone Encounter (Signed)
New message      Request for surgical clearance:  1. What type of surgery is being performed?  Eye lid surgery  When is this surgery scheduled?  05-11-17 Are there any medications that need to be held prior to surgery and how long? Hold aspirin 1 week and pletal 5 days prior 2. Name of physician performing surgery?  Dr Carilyn GoodpastureYeatts  What is your office phone and fax number?  Fax [phone]

## 2017-04-26 NOTE — Patient Instructions (Addendum)
We will be checking the following labs today - NONE   Medication Instructions:    Continue with your current medicines.     Testing/Procedures To Be Arranged:  Echocardiogram  Follow-Up:   See Dr. Katrinka BlazingSmith in one year    Other Special Instructions:   N/A    If you need a refill on your cardiac medications before your next appointment, please call your pharmacy.   Call the [facility] office at [phone] if you have any questions, problems or concerns.

## 2017-04-26 NOTE — Progress Notes (Signed)
CARDIOLOGY OFFICE NOTE  Date:  04/26/2017    Sydney Gross Date of Birth: 27-Mar-1930 Medical Record #[id number]  PCP:  Lupita RaiderShaw, Kimberlee, MD  Cardiologist:  Kyra Manges[facility]hardt & Smith   Chief Complaint  Patient presents with  . Cardiac Valve Problem    Follow up visit - seen for Dr. Katrinka BlazingSmith    History of Present Illness: Sydney Gross is a 83 y.o. female who presents today for a follow up visit. Seen for Dr. Katrinka BlazingSmith.   She has a history of supravalvular aortic stenosis, hypertension, diastolic heart failure (stable), and chronic dyspnea.  Last seen by Dr. Katrinka BlazingSmith back in February of 2017 - some shortness of breath noted but overall felt to be stable.   Comes in today. Here with her husband Sydney Gross. She is doing well. No chest pain. Does have shortness of breath if she hurries or walks very fast - it is unchanged - not getting any worse.  No syncope. She feels like she is doing well and is happy with how she is doing. Just celebrated [age] year anniversary. [facility]. She has no real concerns today. They both go and exercise 3 times a week without any issue.   Past Medical History:  Diagnosis Date  . Allergic rhinitis   . Arthritis    "maybe a little in my knees and back" (12/10/2013)  . Colitis 12/2013   "never had this before"  . COPD (chronic obstructive pulmonary disease) (HCC)   . DOE (dyspnea on exertion)   . Exertional shortness of breath    "off and on" (12/10/2013)  . Family history of anesthesia complication    "mother; think they gave her too much; c/o pain after OR; gave her more RX; they had to bring her back real quick"  . H/O calcium pyrophosphate deposition disease (CPPD)   . Hiatal hernia   . Hyperlipidemia   . Hypertension   . PAD (peripheral artery disease) (HCC)   . Proteinuria   . Shingles   . Stenosis of tear duct     Past Surgical History:  Procedure Laterality Date  . ABDOMINAL HYSTERECTOMY  1960's  . BACK  SURGERY    . CESAREAN SECTION  1957; 1960  . CHOLECYSTECTOMY  ~ 2009  . LUMBAR DISC SURGERY  ?2004  . TONSILLECTOMY AND ADENOIDECTOMY  ?1940's     Medications: Current Outpatient Prescriptions  Medication Sig Dispense Refill  . acetaminophen (TYLENOL) 500 MG tablet Take 500 mg by mouth every 6 (six) hours as needed for moderate pain.    [name]. albuterol (PROVENTIL) (2.5 MG/3ML) 0.083% nebulizer solution Take 3 mLs (2.5 mg total) by nebulization every 4 (four) hours as needed for wheezing or shortness of breath. 75 mL 0  . aspirin EC 81 MG tablet Take 81 mg by mouth every evening.     . budesonide-formoterol (SYMBICORT) 160-4.5 MCG/ACT inhaler Inhale 2 puffs into the lungs 2 (two) times daily.    . Cholecalciferol (VITAMIN D PO) Take 1 capsule by mouth every evening.    . cilostazol (PLETAL) 50 MG tablet Take 50 mg by mouth 2 (two) times daily.   6  . diphenhydramine-acetaminophen (TYLENOL PM) 25-500 MG TABS Take 1 tablet by mouth at bedtime as needed (pain/sleep).     [name]. lisinopril-hydrochlorothiazide (PRINZIDE,ZESTORETIC) 20-25 MG tablet Take 1 tablet by mouth daily. Take half (1/2) tablet by mouth daily every evening.    . metoprolol tartrate (LOPRESSOR) 25  MG tablet Take 25 mg by mouth 2 (two) times daily.    . naproxen (NAPROSYN) 500 MG tablet Take 500 mg by mouth as needed for moderate pain.     No current facility-administered medications for this visit.     Allergies: Allergies  Allergen Reactions  . Codeine     GI upset  . Crestor [Rosuvastatin] Other (See Comments)    MYALGIAS  . Darvon [Propoxyphene] Nausea Only  . Lipitor [Atorvastatin] Other (See Comments)    MYALGIAS  . Zocor [Simvastatin] Other (See Comments)    MYALGIAS    Social History: The patient  reports that she has never smoked. She has never used smokeless tobacco. She reports that she drinks about 2.4 oz of alcohol per week . She reports that she does not use drugs.   Family History: The patient's family  history includes CVA in her mother; Cancer in her brother; Emphysema (age of onset: 76) in her father.   Review of Systems: Please see the history of present illness.   Otherwise, the review of systems is positive for none.   All other systems are reviewed and negative.   Physical Exam: VS:  BP 136/80 (BP Location: Left Arm, Patient Position: Sitting, Cuff Size: Normal)   Pulse 68   Ht 4\' 9"  (1.448 m)   Wt 120 lb 12.8 oz (54.8 kg)   BMI 26.14 kg/m  .  BMI Body mass index is 26.14 kg/m.  Wt Readings from Last 3 Encounters:  04/26/17 120 lb 12.8 oz (54.8 kg)  11/18/16 120 lb 6.4 oz (54.6 kg)  08/06/16 123 lb (55.8 kg)    General: Pleasant. She looks younger than her stated age. She is alert and in no acute distress.   HEENT: Normal.  Neck: Supple, no JVD, carotid bruits, or masses noted.  Cardiac: Regular rate and rhythm. Soft murmur noted. No edema.  Respiratory:  Lungs are clear to auscultation bilaterally with normal work of breathing.  GI: Soft and nontender.  MS: No deformity or atrophy. Gait and ROM intact.  Skin: Warm and dry. Color is normal.  Neuro:  Strength and sensation are intact and no gross focal deficits noted.  Psych: Alert, appropriate and with normal affect.   LABORATORY DATA:  EKG:  EKG is not ordered today.   Lab Results  Component Value Date   WBC 10.9 (H) 11/21/2016   HGB 11.2 (L) 11/21/2016   HCT 34.0 (L) 11/21/2016   PLT 222 11/21/2016   GLUCOSE 139 (H) 11/21/2016   ALT 16 11/17/2016   AST 23 11/17/2016   NA 139 11/21/2016   K 4.4 11/21/2016   CL 106 11/21/2016   CREATININE 0.77 11/21/2016   BUN 33 (H) 11/21/2016   CO2 26 11/21/2016   TSH 1.371 10/30/2015   INR 1.00 12/10/2013    BNP (last 3 results) No results for input(s): BNP in the last 8760 hours.  ProBNP (last 3 results) No results for input(s): PROBNP in the last 8760 hours.   Other Studies Reviewed Today:  Echo Study Conclusions from 12/2015  - Left ventricle: The cavity  size was normal. There was mild focal   basal hypertrophy of the septum. Systolic function was normal.   The estimated ejection fraction was in the range of 60% to 65%.   Wall motion was normal; there were no regional wall motion   abnormalities. Features are consistent with a pseudonormal left   ventricular filling pattern, with concomitant abnormal relaxation   and  increased filling pressure (grade 2 diastolic dysfunction). - Aortic valve: Sclerosis without stenosis. There was trivial   regurgitation. Mean gradient of 11 mmHg across LV outflow may be   due to mild degree of supravalvular aortic stenosis. - Aorta: Normal aortic root dimension. The ascending aorta just   beyond the root narrows to about 13 mm. Aortic root dimension: 28   mm (ED). Ascending aortic diameter: 37 mm (S) beyond   supravalvular stenosis. - Mitral valve: Mildly calcified annulus. There was trivial   regurgitation. - Left atrium: The atrium was moderately dilated. - Right ventricle: The cavity size was normal. Systolic function   was normal. - Tricuspid valve: Peak RV-RA gradient (S): 38 mm Hg. - Pulmonary arteries: PA peak pressure: 46 mm Hg (S). - Systemic veins: IVC measured 2.3 cm with > 50% respirophasic   variation, suggesting RA pressure 8 mmHg.  Impressions:  - Normal LV size with mild focal basal septal hypertrophy. EF   60-65%. Moderate diastolic dysfunction. Aortic valve sclerosis   without significant stenosis. Significant narrowing at the   sinotubular junction may represent a degree of supravalvular   aortic stenosis and likely explains the elevated mean gradient of   11 mmHg across the LV ouflow. Normal RV size and systolic   function. Mild pulmonary hypertension.   Assessment/Plan:  1. Aortic stenosis, supravalvular  mild supravalvular aortic stenosis without significant gradient. Aortic root size is normal beyond the region of obstruction. No cardinal symptoms. Will get her echo  updated. She is doing well clinically. Her labs are followed by her PCP.   2. Aortic root enlargement (HCC)  minimal measuring 3.7 cm  3. Essential hypertension  BP looks great on her current regimen. No changes made today.  4. Dyspnea on exertion  This is stable and not progressing. Will continue to follow.  5. Bradycardia - HR is fine here today and on exam.    Current medicines are reviewed with the patient today.  The patient does not have concerns regarding medicines other than what has been noted above.  The following changes have been made:  See above.  Labs/ tests ordered today include:    Orders Placed This Encounter  Procedures  . ECHOCARDIOGRAM COMPLETE     Disposition:   FU with Dr. Katrinka Blazing in 6 months.   Patient is agreeable to this plan and will call if any problems develop in the interim.   SignedNorma Fredrickson, NP  04/26/2017 11:58 AM  [facility] [address] [place], [place]  [address] Phone: [phone] Fax: [phone]

## 2017-04-26 NOTE — Telephone Encounter (Signed)
Consider holding aspirin

## 2017-04-27 ENCOUNTER — Other Ambulatory Visit: Payer: Self-pay

## 2017-04-27 ENCOUNTER — Encounter: Payer: Self-pay | Admitting: Interventional Cardiology

## 2017-04-27 ENCOUNTER — Ambulatory Visit (HOSPITAL_COMMUNITY): Payer: Medicare Other | Attending: Cardiovascular Disease

## 2017-04-27 DIAGNOSIS — Q253 Supravalvular aortic stenosis: Secondary | ICD-10-CM

## 2017-04-27 NOTE — Telephone Encounter (Signed)
Clarified with Dr. Katrinka BlazingSmith- consider holding ASA.  Pletal not prescribed by Dr. Katrinka BlazingSmith.  Holding instructions for Pletal will need to come from provider who prescribed this medication.

## 2017-04-27 NOTE — Telephone Encounter (Signed)
Faxed to requesting office. 

## 2017-05-01 ENCOUNTER — Other Ambulatory Visit: Payer: Self-pay | Admitting: Interventional Cardiology

## 2017-05-06 HISTORY — PX: BLEPHAROPLASTY: SUR158

## 2017-05-06 HISTORY — PX: DACROCYSTORHINOSTOMY W/ JONES TUBE: SHX1436

## 2017-05-11 DIAGNOSIS — I350 Nonrheumatic aortic (valve) stenosis: Secondary | ICD-10-CM | POA: Diagnosis not present

## 2017-05-11 DIAGNOSIS — I272 Pulmonary hypertension, unspecified: Secondary | ICD-10-CM | POA: Diagnosis not present

## 2017-05-11 DIAGNOSIS — I10 Essential (primary) hypertension: Secondary | ICD-10-CM | POA: Diagnosis not present

## 2017-05-11 DIAGNOSIS — H0231 Blepharochalasis right upper eyelid: Secondary | ICD-10-CM | POA: Diagnosis not present

## 2017-05-11 DIAGNOSIS — I70209 Unspecified atherosclerosis of native arteries of extremities, unspecified extremity: Secondary | ICD-10-CM | POA: Diagnosis not present

## 2017-05-11 DIAGNOSIS — E785 Hyperlipidemia, unspecified: Secondary | ICD-10-CM | POA: Diagnosis not present

## 2017-05-11 DIAGNOSIS — H02423 Myogenic ptosis of bilateral eyelids: Secondary | ICD-10-CM | POA: Diagnosis not present

## 2017-05-11 DIAGNOSIS — Z9841 Cataract extraction status, right eye: Secondary | ICD-10-CM | POA: Diagnosis not present

## 2017-05-11 DIAGNOSIS — H04223 Epiphora due to insufficient drainage, bilateral lacrimal glands: Secondary | ICD-10-CM | POA: Diagnosis not present

## 2017-05-11 DIAGNOSIS — J449 Chronic obstructive pulmonary disease, unspecified: Secondary | ICD-10-CM | POA: Diagnosis not present

## 2017-05-11 DIAGNOSIS — M1990 Unspecified osteoarthritis, unspecified site: Secondary | ICD-10-CM | POA: Diagnosis not present

## 2017-05-11 DIAGNOSIS — H0234 Blepharochalasis left upper eyelid: Secondary | ICD-10-CM | POA: Diagnosis not present

## 2017-07-08 DIAGNOSIS — J449 Chronic obstructive pulmonary disease, unspecified: Secondary | ICD-10-CM | POA: Diagnosis not present

## 2017-07-08 DIAGNOSIS — I739 Peripheral vascular disease, unspecified: Secondary | ICD-10-CM | POA: Diagnosis not present

## 2017-07-08 DIAGNOSIS — E201 Pseudohypoparathyroidism: Secondary | ICD-10-CM | POA: Diagnosis not present

## 2017-07-08 DIAGNOSIS — Z0000 Encounter for general adult medical examination without abnormal findings: Secondary | ICD-10-CM | POA: Diagnosis not present

## 2017-07-22 DIAGNOSIS — J069 Acute upper respiratory infection, unspecified: Secondary | ICD-10-CM | POA: Diagnosis not present

## 2017-07-22 DIAGNOSIS — J449 Chronic obstructive pulmonary disease, unspecified: Secondary | ICD-10-CM | POA: Diagnosis not present

## 2017-08-12 ENCOUNTER — Ambulatory Visit: Payer: Medicare Other | Admitting: Family

## 2017-08-12 ENCOUNTER — Encounter (HOSPITAL_COMMUNITY): Payer: Medicare Other

## 2017-08-25 ENCOUNTER — Encounter: Payer: Self-pay | Admitting: Family

## 2017-08-25 ENCOUNTER — Ambulatory Visit (HOSPITAL_COMMUNITY)
Admission: RE | Admit: 2017-08-25 | Discharge: 2017-08-25 | Disposition: A | Discharge status: Home / Self Care | Payer: Medicare Other | Source: Ambulatory Visit | Attending: Family | Admitting: Family

## 2017-08-25 ENCOUNTER — Ambulatory Visit (INDEPENDENT_AMBULATORY_CARE_PROVIDER_SITE_OTHER): Payer: Medicare Other | Admitting: Family

## 2017-08-25 VITALS — BP 132/79 | HR 65 | Temp 97.3°F | Resp 18 | Ht 59.5 in | Wt 123.5 lb

## 2017-08-25 DIAGNOSIS — I70213 Atherosclerosis of native arteries of extremities with intermittent claudication, bilateral legs: Secondary | ICD-10-CM

## 2017-08-25 DIAGNOSIS — I77810 Thoracic aortic ectasia: Secondary | ICD-10-CM | POA: Insufficient documentation

## 2017-08-25 DIAGNOSIS — R9439 Abnormal result of other cardiovascular function study: Secondary | ICD-10-CM | POA: Diagnosis not present

## 2017-08-25 DIAGNOSIS — I350 Nonrheumatic aortic (valve) stenosis: Secondary | ICD-10-CM | POA: Insufficient documentation

## 2017-08-25 NOTE — Patient Instructions (Signed)
Intermittent Claudication Intermittent claudication is pain in your leg that occurs when you walk or exercise and goes away when you rest. The pain can occur in one or both legs. What are the causes? Intermittent claudication is caused by the buildup of plaque within the major arteries in the body (atherosclerosis). The plaque, which makes arteries stiff and narrow, prevents enough blood from reaching your leg muscles. The pain occurs when you walk or exercise because your muscles need more blood when you are moving and exercising. What increases the risk? Risk factors include:  A family history of atherosclerosis.  A personal history of stroke or heart disease.  Older age.  Being inactive or overweight.  Smoking cigarettes.  Having another health condition such as: ? Diabetes. ? High blood pressure. ? High cholesterol.  What are the signs or symptoms? Your hip or leg may:  Ache.  Cramp.  Feel tight.  Feel weak.  Feel heavy.  Over time, you may feel pain in your calf, thigh, or hip. How is this diagnosed? Your health care provider may diagnose intermittent claudication based on your symptoms and medical history. Your health care provider may also do tests to learn more about your condition. These may include:  Blood tests.  An ultrasound.  Imaging tests such as angiography, magnetic resonance angiography (MRA), and computed tomography angiography (CTA).  How is this treated? You may be treated for problems such as:  High blood pressure.  High cholesterol.  Diabetes.  Other treatments may include:  Lifestyle changes such as: ? Starting an exercise program. ? Losing weight. ? Quitting smoking.  Medicines to help restore blood flow through your legs.  Blood vessel surgery (angioplasty) to restore blood flow if your intermittent claudication is caused by severe peripheral artery disease.  Follow these instructions at home:  Manage any other health  conditions you have.  Eat a diet low in saturated fats and calories to maintain a healthy weight.  Quit smoking, if you smoke.  Take medicines only as directed by your health care provider.  If your health care provider recommended an exercise program for you, follow it as directed. Your exercise program may involve: ? Walking three or more times a week. ? Walking until you have certain symptoms of intermittent claudication. ? Resting until symptoms go away. ? Gradually increasing walking time to about 50 minutes a day. Contact a health care provider if: Your condition is not getting better or is getting worse. Get help right away if:  You have chest pain.  You have difficulty breathing.  You develop arm weakness.  You have trouble speaking.  Your face begins to droop. This information is not intended to replace advice given to you by your health care provider. Make sure you discuss any questions you have with your health care provider. Document Released: 09/24/2004 Document Revised: 04/29/2016 Document Reviewed: 02/28/2014 Elsevier Interactive Patient Education  2017 [employer]  

## 2017-08-25 NOTE — Progress Notes (Signed)
CC: Follow up Intermittent Claudication  History of Present Illness  Sydney Gross is a 83 y.o. (January 18, 1930) female patient of Dr. Imogene Burn who presents with chief complaint: continued intermittent claudication. This patient continues with her extensive exercise regimen with stable calf claudication unchanged. The patient's treatment regimen currently included: maximal medical management and walking plan. After walking about 1/2 mile she develops bilateral calf pain which resolves with rest. She continues to take cilostizol 100 mg bid to help with claudication.  She exercises about 45 minutes most days of the week in a gym, walks about 5 minutes on a track. She denies non healing wounds.  She states her daughters have Raynaud's Syndrome in hands and feet.  Pt denies any history of stroke or TIA, denies any cardiac problems or MI.  Pt has not had previous peripheral vascular intervention.  Pt Diabetic: No Pt smoker: non-smoker  Pt meds include: Statin :No, statins cause myalgias. Pt states she and her doctor are considering starting her on an injectable medication for her cholesterol.  Betablocker: Yes ASA: Yes Other anticoagulants/antiplatelets: no   Past Medical History:  Diagnosis Date  . Allergic rhinitis   . Arthritis    "maybe a little in my knees and back" (12/10/2013)  . Colitis 12/2013   "never had this before"  . COPD (chronic obstructive pulmonary disease) (HCC)   . DOE (dyspnea on exertion)   . Exertional shortness of breath    "off and on" (12/10/2013)  . Family history of anesthesia complication    "mother; think they gave her too much; c/o pain after OR; gave her more RX; they had to bring her back real quick"  . H/O calcium pyrophosphate deposition disease (CPPD)   . Hiatal hernia   . Hyperlipidemia   . Hypertension   . Numbness in feet   . PAD (peripheral artery disease) (HCC)   . Proteinuria   . Shingles   . Stenosis of tear duct     Social  History Social History  Substance Use Topics  . Smoking status: Never Smoker  . Smokeless tobacco: Never Used  . Alcohol use 2.4 oz/week    4 Glasses of wine per week     Comment: glass of wine every other night    Family History Family History  Problem Relation Age of Onset  . Emphysema Father 69  . CVA Mother   . Cancer Brother        ? type    Surgical History Past Surgical History:  Procedure Laterality Date  . ABDOMINAL HYSTERECTOMY  1960's  . BACK SURGERY    . CESAREAN SECTION  1957; 1960  . CHOLECYSTECTOMY  ~ 2009  . DACROCYSTORHINOSTOMY W/ JONES TUBE Bilateral 05/2017  . eyelid lift Bilateral 05/2017  . LUMBAR DISC SURGERY  ?2004  . TONSILLECTOMY AND ADENOIDECTOMY  ?1940's    Allergies  Allergen Reactions  . Codeine     GI upset  . Crestor [Rosuvastatin] Other (See Comments)    MYALGIAS  . Darvon [Propoxyphene] Nausea Only  . Lipitor [Atorvastatin] Other (See Comments)    MYALGIAS  . Zocor [Simvastatin] Other (See Comments)    MYALGIAS    Current Outpatient Prescriptions  Medication Sig Dispense Refill  . acetaminophen (TYLENOL) 500 MG tablet Take 500 mg by mouth every 6 (six) hours as needed for moderate pain.    [name] albuterol (PROVENTIL) (2.5 MG/3ML) 0.083% nebulizer solution Take 3 mLs (2.5 mg total) by nebulization every 4 (four) hours  as needed for wheezing or shortness of breath. 75 mL 0  . aspirin EC 81 MG tablet Take 81 mg by mouth every evening.     . budesonide-formoterol (SYMBICORT) 160-4.5 MCG/ACT inhaler Inhale 2 puffs into the lungs 2 (two) times daily.    . Cholecalciferol (VITAMIN D PO) Take 1 capsule by mouth every evening.    . cilostazol (PLETAL) 50 MG tablet Take 50 mg by mouth 2 (two) times daily.   6  . diphenhydramine-acetaminophen (TYLENOL PM) 25-500 MG TABS Take 1 tablet by mouth at bedtime as needed (pain/sleep).     [name] lisinopril-hydrochlorothiazide (PRINZIDE,ZESTORETIC) 20-25 MG tablet Take 1 tablet by mouth daily. Take half (1/2)  tablet by mouth daily every evening.    . metoprolol tartrate (LOPRESSOR) 25 MG tablet Take 0.5 tablets (12.5 mg total) by mouth 2 (two) times daily. 90 tablet 3  . naproxen (NAPROSYN) 500 MG tablet Take 500 mg by mouth as needed for moderate pain.     No current facility-administered medications for this visit.     REVIEW OF SYSTEMS: see HPI for pertinent positives and negatives    Physical Examination  Vitals:   08/25/17 1538  BP: 132/79  Pulse: 65  Resp: 18  Temp: (!) 97.3 F (36.3 C)  TempSrc: Oral  SpO2: 93%  Weight: 123 lb 8 oz (56 kg)  Height: 4' 11.5" (1.511 m)   Body mass index is 24.53 kg/m.  General: A&O x 3, WDWN. Gait: normal Eyes: PERRLA. Pulmonary: Respirations are non labored, CTAB, good air movement Cardiac: regular rhythm, rare premature and missed contractions, no detected murmur.    Carotid Bruits Right Left   Negative Negative   Abdominal aortic pulse is not palpable. Radial pulses: 2+ palpable and =   VASCULAR EXAM: Extremitieswithoutischemic changes, withoutGangrene; withoutopen wounds. Dorsal aspects of both great toes, and to a lesser extent the remaining toes, are cyanotic when dependent, are pink with elevation of feet.   LE Pulses Right Left  FEMORAL 2+ palpable 2+ palpable  POPLITEAL not palpable not palpable  POSTERIOR TIBIAL  not palpable  not palpable  DORSALIS PEDIS ANTERIOR TIBIAL  not palpable  not palpable   Abdomen: soft, NT, no palpable masses. Skin: no rashes, no ulcers. Musculoskeletal: no muscle wasting or atrophy. Neurologic: A&O X 3; Appropriate Affect; MOTOR FUNCTION: moving all extremities equally, motor strength 5/5 throughout. Speech is fluent/normal. CN 2-12 intact    Medical Decision Making  Sydney Gross is a 83 y.o. female who presents with stable mild intermittent claudication of both calves, no signs of  ischemia in her lower extremities.  She is continuing her walking and exercising, but has not been as active in the last few months as she had been. Mild decline in ABI and TBI reflect this.   Her atherosclerotic risk factors include dyslipidemia, statin intolerance, and extensive family history of dyslipidemia. Fortunately she does not have DM and never used tobacco.  DATA  ABI (Date: 08/25/2017):  R:   ABI: 0.77 (was 0.86 on 08-06-16),   PT: bi  DP: mono  TBI:  0.29 (was 0.71)  L:   ABI: 0.76 (was 0.82),   PT: bi  DP: bi  TBI: 0.39 (was 0.61) Decline in bilateral ABI and TBI.   PLAN:  Continue graduated walking program.   Based on the patient's vascular studies and examination, pt will return to clinic in 1 year with ABI's.   I advised her to notify us if she develops concerns  re the circulation in her legs.    I discussed in depth with the patient the nature of atherosclerosis, and emphasized the importance of maximal medical management including strict control of blood pressure, blood glucose, and lipid levels, antiplatelet agents, obtaining regular exercise, and cessation of smoking.         I discussed with the patient the natural history of intermittent claudication: 75% of patients have stable or improved symptoms in a year an only 2% require       amputation. Eventually 20% may require intervention in a year.  The patient is aware that without maximal medical management the underlying atherosclerotic disease process will progress, limiting the benefit of any interventions.   Thank you for allowing [name] to participate in this patient's care.  NICKEL, Carma Lair, RN, MSN, FNP-C [facility] Office: [phone]  08/25/2017, 3:49 PM  Clinic MD: Darrick Penna

## 2017-08-30 DIAGNOSIS — M810 Age-related osteoporosis without current pathological fracture: Secondary | ICD-10-CM | POA: Diagnosis not present

## 2017-08-30 DIAGNOSIS — Z780 Asymptomatic menopausal state: Secondary | ICD-10-CM | POA: Diagnosis not present

## 2017-08-30 DIAGNOSIS — Z23 Encounter for immunization: Secondary | ICD-10-CM | POA: Diagnosis not present

## 2017-09-02 NOTE — Addendum Note (Signed)
Addended by: Burton Apley A on: 09/02/2017 12:18 PM   Modules accepted: Orders

## 2017-12-19 DIAGNOSIS — M810 Age-related osteoporosis without current pathological fracture: Secondary | ICD-10-CM | POA: Diagnosis not present

## 2018-01-02 ENCOUNTER — Emergency Department (HOSPITAL_COMMUNITY)
Admission: EM | Admit: 2018-01-02 | Discharge: 2018-01-02 | Discharge status: Absent without leave | Payer: Medicare Other

## 2018-01-02 DIAGNOSIS — R1032 Left lower quadrant pain: Secondary | ICD-10-CM | POA: Diagnosis not present

## 2018-01-02 DIAGNOSIS — N390 Urinary tract infection, site not specified: Secondary | ICD-10-CM | POA: Diagnosis not present

## 2018-01-03 DIAGNOSIS — K5792 Diverticulitis of intestine, part unspecified, without perforation or abscess without bleeding: Secondary | ICD-10-CM | POA: Diagnosis not present

## 2018-01-26 DIAGNOSIS — D692 Other nonthrombocytopenic purpura: Secondary | ICD-10-CM | POA: Diagnosis not present

## 2018-01-26 DIAGNOSIS — L570 Actinic keratosis: Secondary | ICD-10-CM | POA: Diagnosis not present

## 2018-01-26 DIAGNOSIS — L720 Epidermal cyst: Secondary | ICD-10-CM | POA: Diagnosis not present

## 2018-03-01 ENCOUNTER — Other Ambulatory Visit: Payer: Self-pay | Admitting: Family Medicine

## 2018-03-01 DIAGNOSIS — R1032 Left lower quadrant pain: Secondary | ICD-10-CM

## 2018-03-06 ENCOUNTER — Ambulatory Visit
Admission: RE | Admit: 2018-03-06 | Discharge: 2018-03-06 | Disposition: A | Discharge status: Home / Self Care | Payer: Medicare Other | Source: Ambulatory Visit | Attending: Family Medicine | Admitting: Family Medicine

## 2018-03-06 DIAGNOSIS — R1032 Left lower quadrant pain: Secondary | ICD-10-CM

## 2018-03-06 DIAGNOSIS — K5730 Diverticulosis of large intestine without perforation or abscess without bleeding: Secondary | ICD-10-CM | POA: Diagnosis not present

## 2018-03-06 MED ORDER — IOPAMIDOL (ISOVUE-300) INJECTION 61%
100.0000 mL | Freq: Once | INTRAVENOUS | Status: AC | PRN
  Administered 2018-03-06: 100 mL via INTRAVENOUS

## 2018-05-11 DIAGNOSIS — R300 Dysuria: Secondary | ICD-10-CM | POA: Diagnosis not present

## 2018-05-14 ENCOUNTER — Other Ambulatory Visit: Payer: Self-pay | Admitting: Interventional Cardiology

## 2018-05-28 NOTE — Progress Notes (Signed)
Cardiology Office Note    Date:  05/29/2018   ID:  Sydney Gross, DOB 05/20/1930, MRN [id number]  PCP:  Sydney Raider, MD  Cardiologist: Sydney Noe, MD   Chief Complaint  Patient presents with  . Coronary Artery Disease  . Claudication    History of Present Illness:  Sydney Gross is a 84 y.o. female who presents for  Supravalvular aortic stenosis, hypertension, diastolic heart failure (stable) , an chronic dyspnea.  Since last being seen several things are happening with the patient.  She notices increasing limitation due to bilateral discomfort in her calves.  This is impairing her quality of life.  She is known to have mild to moderate PAD based on prior screening studies.  She also notices some increasing shortness of breath with activity.  She is known to have "supravalvular aortic stenosis".  She denies orthopnea and PND.  Shortness of breath is with moderate activity.  She does state that she feels not as well conditioned as previous because lower extremity pain prevents ambulation and activity completion.   Past Medical History:  Diagnosis Date  . Allergic rhinitis   . Arthritis    "maybe a little in my knees and back" (12/10/2013)  . Colitis 12/2013   "never had this before"  . COPD (chronic obstructive pulmonary disease) (HCC)   . DOE (dyspnea on exertion)   . Exertional shortness of breath    "off and on" (12/10/2013)  . Family history of anesthesia complication    "mother; think they gave her too much; c/o pain after OR; gave her more RX; they had to bring her back real quick"  . H/O calcium pyrophosphate deposition disease (CPPD)   . Hiatal hernia   . Hyperlipidemia   . Hypertension   . Numbness in feet   . PAD (peripheral artery disease) (HCC)   . Proteinuria   . Shingles   . Stenosis of tear duct     Past Surgical History:  Procedure Laterality Date  . ABDOMINAL HYSTERECTOMY  1960's  . BACK SURGERY    . CESAREAN SECTION  1957; 1960  .  CHOLECYSTECTOMY  ~ 2009  . DACROCYSTORHINOSTOMY W/ JONES TUBE Bilateral 05/2017  . eyelid lift Bilateral 05/2017  . LUMBAR DISC SURGERY  ?2004  . TONSILLECTOMY AND ADENOIDECTOMY  ?1940's    Current Medications: Outpatient Medications Prior to Visit  Medication Sig Dispense Refill  . acetaminophen (TYLENOL) 500 MG tablet Take 500 mg by mouth every 6 (six) hours as needed for moderate pain.    [name] albuterol (PROVENTIL) (2.5 MG/3ML) 0.083% nebulizer solution Take 3 mLs (2.5 mg total) by nebulization every 4 (four) hours as needed for wheezing or shortness of breath. 75 mL 0  . aspirin EC 81 MG tablet Take 81 mg by mouth every evening.     . budesonide-formoterol (SYMBICORT) 160-4.5 MCG/ACT inhaler Inhale 2 puffs into the lungs 2 (two) times daily.    . Cholecalciferol (VITAMIN D PO) Take 1 capsule by mouth every evening.    . cilostazol (PLETAL) 50 MG tablet Take 50 mg by mouth 2 (two) times daily.   6  . Denosumab (PROLIA [place]) Use as directed.    . diphenhydramine-acetaminophen (TYLENOL PM) 25-500 MG TABS Take 1 tablet by mouth at bedtime as needed (pain/sleep).     . Evolocumab (REPATHA) 140 MG/ML SOSY Inject 140 mg into the skin every 14 (fourteen) days.    [name] lisinopril-hydrochlorothiazide (PRINZIDE,ZESTORETIC) 20-25 MG tablet Take 1 tablet  by mouth daily. Take half (1/2) tablet by mouth daily every evening.    . metoprolol tartrate (LOPRESSOR) 25 MG tablet Take 0.5 tablets (12.5 mg total) by mouth 2 (two) times daily. Please keep upcoming appt with Dr. Katrinka Blazing in June for future refills.Thank you 90 tablet 0  . naproxen (NAPROSYN) 500 MG tablet Take 500 mg by mouth as needed for moderate pain.     No facility-administered medications prior to visit.      Allergies:   Codeine; Crestor [rosuvastatin]; Darvon [propoxyphene]; Lipitor [atorvastatin]; and Zocor [simvastatin]   Social History   Socioeconomic History  . Marital status: Married    Spouse name: Not on file  . Number of children:  Not on file  . Years of education: Not on file  . Highest education level: Not on file  Occupational History  . Not on file  Social Needs  . Financial resource strain: Not on file  . Food insecurity:    Worry: Not on file    Inability: Not on file  . Transportation needs:    Medical: Not on file    Non-medical: Not on file  Tobacco Use  . Smoking status: Never Smoker  . Smokeless tobacco: Never Used  Substance and Sexual Activity  . Alcohol use: Yes    Alcohol/week: 2.4 oz    Types: 4 Glasses of wine per week    Comment: glass of wine every other night  . Drug use: No  . Sexual activity: Never  Lifestyle  . Physical activity:    Days per week: Not on file    Minutes per session: Not on file  . Stress: Not on file  Relationships  . Social connections:    Talks on phone: Not on file    Gets together: Not on file    Attends religious service: Not on file    Active member of club or organization: Not on file    Attends meetings of clubs or organizations: Not on file    Relationship status: Not on file  Other Topics Concern  . Not on file  Social History Narrative  . Not on file     Family History:  The patient's family history includes CVA in her mother; Cancer in her brother; Emphysema (age of onset: 40) in her father.   ROS:   Please see the history of present illness.    Occasional wheezing, shortness of breath with activity, back pain, muscle pain, and easy bruising. All other systems reviewed and are negative.   PHYSICAL EXAM:   VS:  BP 116/70   Pulse 64   Ht 4\' 11"  (1.499 m)   Wt 119 lb 6.4 oz (54.2 kg)   BMI 24.12 kg/m    GEN: Well nourished, well developed, in no acute distress  HEENT: normal  Neck: no JVD, carotid bruits, or masses Cardiac: RRR; no murmurs, rubs, or gallops,no edema  Respiratory:  clear to auscultation bilaterally, normal work of breathing GI: soft, nontender, nondistended, + BS MS: no deformity or atrophy  Skin: warm and dry, no  rash Neuro:  Alert and Oriented x 3, Strength and sensation are intact Psych: euthymic mood, full affect  Wt Readings from Last 3 Encounters:  05/29/18 119 lb 6.4 oz (54.2 kg)  08/25/17 123 lb 8 oz (56 kg)  04/26/17 120 lb 12.8 oz (54.8 kg)      Studies/Labs Reviewed:   EKG:  EKG normal sinus rhythm with normal appearance.  No change compared to  prior.  Recent Labs: No results found for requested labs within last 8760 hours.   Lipid Panel No results found for: CHOL, TRIG, HDL, CHOLHDL, VLDL, LDLCALC, LDLDIRECT  Additional studies/ records that were reviewed today include:  2D Doppler echocardiogram May 2018:  ------------------------------------------------------------------- Study Conclusions   - Left ventricle: The cavity size was normal. Wall thickness was   increased in a pattern of mild LVH. Systolic function was   vigorous. The estimated ejection fraction was in the range of 65%   to 70%. Wall motion was normal; there were no regional wall   motion abnormalities. Features are consistent with a pseudonormal   left ventricular filling pattern, with concomitant abnormal   relaxation and increased filling pressure (grade 2 diastolic   dysfunction). - Left atrium: The atrium was moderately dilated. - Tricuspid valve: There was moderate regurgitation. - Pulmonary arteries: Systolic pressure was moderately increased.   PA peak pressure: 41 mm Hg (S).   ASSESSMENT:    1. Aortic stenosis, supravalvular   2. Atherosclerosis of native artery of right lower extremity with intermittent claudication (HCC)   3. Bradycardia   4. COPD exacerbation (HCC)   5. Dilated aortic root (HCC)   6. Dyslipidemia   7. Claudication (HCC)   8. SOB (shortness of breath)      PLAN:  In order of problems listed above:  1. No change in clinical exam.  Need to determine if shortness of breath is cardiac related.  Most recent echocardiogram performed a year ago as noted above.   Unfortunately, the aortic valve and LV outflow interrogation was not highlighted but appeared to demonstrate overall relatively low gradient.. 2. Lifestyle limiting claudication.  Repeat lower extremity Doppler study and refer to Dr. Allyson Sabal or Kirke Corin. 3. Not clinically significant currently 4. Shortness of breath could be related to lung disease. 5. Aortic size was felt to be normal on last evaluation in 2018. 6. 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  LDL target should be as low as possible..  Currently using Repatha as she has statin intolerance.  Most recent LDL is unknown and is being followed by Dr. Humberto Leep at [facility].  LDL was 410 in August 2018.  Total cholesterol 512.  If not already enrolled in a lipid clinic, she should be.  Consider adding EPA (icosapent) since triglycerides are mildly elevated and therapy has been noted to decrease ischemic events. 7. Check BNP to exclude pulmonary congestion as etiology for shortness of breath..  If BNP is not elevated, will need to reassess for evidence of myocardial ischemia given extremely elevated lipids.  We will need clinical follow-up in 1 year.  Claudication is becoming lifestyle disruptive and therefore will refer to 1 of my PV colleagues.  Medication Adjustments/Labs and Tests Ordered: Current medicines are reviewed at length with the patient today.  Concerns regarding medicines are outlined above.  Medication changes, Labs and Tests ordered today are listed in the Patient Instructions below. Patient Instructions  Medication Instructions:  Your physician recommends that you continue on your current medications as directed. Please refer to the Current Medication list given to you today.  Labwork: Pro BNP today  Testing/Procedures: Your physician has requested that you have a lower extremity arterial duplex. This test is an ultrasound of the arteries in the legs. It looks at arterial blood flow in the legs. Allow one hour for Lower  Arterial scans. There are no restrictions or special instructions   Follow-Up: You have been referred to Dr. Kirke Corin for Peripheral  Artery Disease.  Would like for you to see him sometime in the next month or so.  Your physician wants you to follow-up in: 1 year with Dr. Katrinka BlazingSmith.  You will receive a reminder letter in the mail two months in advance. If you don't receive a letter, please call our office to schedule the follow-up appointment.   Any Other Special Instructions Will Be Listed Below (If Applicable).     If you need a refill on your cardiac medications before your next appointment, please call your pharmacy.      Signed, Sydney NoeHenry W Loys Shugars III, MD  05/29/2018 5:53 PM    [facility] [address], [place], [place]  [address] Phone: [phone]; Fax: [phone]

## 2018-05-29 ENCOUNTER — Ambulatory Visit: Payer: Medicare Other | Admitting: Interventional Cardiology

## 2018-05-29 ENCOUNTER — Encounter: Payer: Self-pay | Admitting: Interventional Cardiology

## 2018-05-29 VITALS — BP 116/70 | HR 64 | Ht 59.0 in | Wt 119.4 lb

## 2018-05-29 DIAGNOSIS — E785 Hyperlipidemia, unspecified: Secondary | ICD-10-CM | POA: Diagnosis not present

## 2018-05-29 DIAGNOSIS — R001 Bradycardia, unspecified: Secondary | ICD-10-CM | POA: Diagnosis not present

## 2018-05-29 DIAGNOSIS — I739 Peripheral vascular disease, unspecified: Secondary | ICD-10-CM | POA: Diagnosis not present

## 2018-05-29 DIAGNOSIS — I77810 Thoracic aortic ectasia: Secondary | ICD-10-CM

## 2018-05-29 DIAGNOSIS — R0602 Shortness of breath: Secondary | ICD-10-CM | POA: Diagnosis not present

## 2018-05-29 DIAGNOSIS — J441 Chronic obstructive pulmonary disease with (acute) exacerbation: Secondary | ICD-10-CM

## 2018-05-29 DIAGNOSIS — Q253 Supravalvular aortic stenosis: Secondary | ICD-10-CM

## 2018-05-29 NOTE — Patient Instructions (Signed)
Medication Instructions:  Your physician recommends that you continue on your current medications as directed. Please refer to the Current Medication list given to you today.  Labwork: Pro BNP today  Testing/Procedures: Your physician has requested that you have a lower extremity arterial duplex. This test is an ultrasound of the arteries in the legs. It looks at arterial blood flow in the legs. Allow one hour for Lower Arterial scans. There are no restrictions or special instructions   Follow-Up: You have been referred to Dr. Kirke CorinArida for Peripheral Artery Disease.  Would like for you to see him sometime in the next month or so.  Your physician wants you to follow-up in: 1 year with Dr. Katrinka BlazingSmith.  You will receive a reminder letter in the mail two months in advance. If you don't receive a letter, please call our office to schedule the follow-up appointment.   Any Other Special Instructions Will Be Listed Below (If Applicable).     If you need a refill on your cardiac medications before your next appointment, please call your pharmacy.

## 2018-05-30 ENCOUNTER — Other Ambulatory Visit: Payer: Self-pay | Admitting: Interventional Cardiology

## 2018-05-30 DIAGNOSIS — I739 Peripheral vascular disease, unspecified: Secondary | ICD-10-CM

## 2018-05-30 LAB — PRO B NATRIURETIC PEPTIDE: NT-Pro BNP: 495 pg/mL (ref 0–738)

## 2018-05-31 ENCOUNTER — Telehealth: Payer: Self-pay | Admitting: *Deleted

## 2018-05-31 DIAGNOSIS — R0602 Shortness of breath: Secondary | ICD-10-CM

## 2018-05-31 DIAGNOSIS — Q253 Supravalvular aortic stenosis: Secondary | ICD-10-CM

## 2018-05-31 MED ORDER — SPIRONOLACTONE 25 MG PO TABS: 12.5000 mg | ORAL_TABLET | Freq: Every day | ORAL | 3 refills | Status: DC

## 2018-05-31 NOTE — Telephone Encounter (Signed)
Spoke with pt and went over results and recommendations per Dr. Katrinka BlazingSmith.  Pt agreeable to plan.  Advised I would send in prescription and would order test.  Pt verbalized understanding and was in agreement with this plan.

## 2018-05-31 NOTE — Telephone Encounter (Signed)
-----   Message from Lyn RecordsHenry W Smith, MD sent at 05/31/2018 12:43 PM EDT ----- Let the patient know she has mild fluid buildup.  Start Aldactone 12.5 mg/day.  Also needs to be set up for a Lexiscan Myoview myocardial perfusion study to rule out progression of CAD as a cause for shortness of breath. A copy will be sent to Lupita RaiderShaw, Kimberlee, MD

## 2018-06-02 ENCOUNTER — Telehealth: Payer: Self-pay | Admitting: Interventional Cardiology

## 2018-06-02 ENCOUNTER — Ambulatory Visit (HOSPITAL_COMMUNITY)
Admission: RE | Admit: 2018-06-02 | Discharge: 2018-06-02 | Disposition: A | Discharge status: Home / Self Care | Payer: Medicare Other | Source: Ambulatory Visit | Attending: Cardiovascular Disease | Admitting: Cardiovascular Disease

## 2018-06-02 ENCOUNTER — Other Ambulatory Visit: Payer: Medicare Other

## 2018-06-02 DIAGNOSIS — I771 Stricture of artery: Secondary | ICD-10-CM | POA: Diagnosis not present

## 2018-06-02 DIAGNOSIS — I739 Peripheral vascular disease, unspecified: Secondary | ICD-10-CM | POA: Insufficient documentation

## 2018-06-02 DIAGNOSIS — I10 Essential (primary) hypertension: Secondary | ICD-10-CM | POA: Diagnosis not present

## 2018-06-02 DIAGNOSIS — E785 Hyperlipidemia, unspecified: Secondary | ICD-10-CM | POA: Diagnosis not present

## 2018-06-02 DIAGNOSIS — R9389 Abnormal findings on diagnostic imaging of other specified body structures: Secondary | ICD-10-CM | POA: Diagnosis not present

## 2018-06-02 NOTE — Telephone Encounter (Signed)
New Message:       Pt c/o medication issue:  1. Name of Medication: spironolactone (ALDACTONE) 25 MG tablet  lisinopril-hydrochlorothiazide (PRINZIDE,ZESTORETIC) 20-25 MG tablet  2. How are you currently taking this medication (dosage and times per day)?   3. Are you having a reaction (difficulty breathing--STAT)?   4. What is your medication issue? Pt is unsure if she is supposed to take both of these medications or stop one and continue with another

## 2018-06-02 NOTE — Telephone Encounter (Signed)
Spoke with pharmacy and made them aware that we plan to check labs on pt to monitor K+.  Advised ok to fill both medications.

## 2018-06-05 NOTE — Telephone Encounter (Signed)
Spoke with pt and scheduled her to come in for labs on 7/8 as she did not start Aldactone until 6/30.  Pt in agreement with plan.

## 2018-06-12 ENCOUNTER — Other Ambulatory Visit: Payer: Medicare Other | Admitting: *Deleted

## 2018-06-12 DIAGNOSIS — R0602 Shortness of breath: Secondary | ICD-10-CM | POA: Diagnosis not present

## 2018-06-12 LAB — BASIC METABOLIC PANEL
BUN/Creatinine Ratio: 19 (ref 12–28)
BUN: 24 mg/dL (ref 8–27)
CO2: 21 mmol/L (ref 20–29)
Calcium: 10.3 mg/dL (ref 8.7–10.3)
Chloride: 106 mmol/L (ref 96–106)
Creatinine, Ser: 1.24 mg/dL — ABNORMAL HIGH (ref 0.57–1.00)
GFR calc Af Amer: 46 mL/min/{1.73_m2} — ABNORMAL LOW (ref >59)
GFR calc non Af Amer: 40 mL/min/{1.73_m2} — ABNORMAL LOW (ref >59)
Glucose: 103 mg/dL — ABNORMAL HIGH (ref 65–99)
Potassium: 4.4 mmol/L (ref 3.5–5.2)
Sodium: 142 mmol/L (ref 134–144)

## 2018-06-19 ENCOUNTER — Telehealth (HOSPITAL_COMMUNITY): Payer: Self-pay | Admitting: *Deleted

## 2018-06-19 DIAGNOSIS — M810 Age-related osteoporosis without current pathological fracture: Secondary | ICD-10-CM | POA: Diagnosis not present

## 2018-06-19 NOTE — Telephone Encounter (Signed)
Left message on voicemail in reference to upcoming appointment scheduled for 06/21/18. Phone number given for a call back so details instructions can be given.  Sydney Gross, [name] [name]

## 2018-06-20 ENCOUNTER — Encounter: Payer: Self-pay | Admitting: Cardiovascular Disease

## 2018-06-20 ENCOUNTER — Encounter (HOSPITAL_COMMUNITY): Payer: Medicare Other

## 2018-06-20 ENCOUNTER — Ambulatory Visit: Payer: Medicare Other | Admitting: Cardiovascular Disease

## 2018-06-20 VITALS — BP 117/75 | HR 63 | Ht 60.0 in | Wt 118.0 lb

## 2018-06-20 DIAGNOSIS — E785 Hyperlipidemia, unspecified: Secondary | ICD-10-CM

## 2018-06-20 DIAGNOSIS — I10 Essential (primary) hypertension: Secondary | ICD-10-CM | POA: Diagnosis not present

## 2018-06-20 DIAGNOSIS — I739 Peripheral vascular disease, unspecified: Secondary | ICD-10-CM | POA: Diagnosis not present

## 2018-06-20 MED ORDER — CILOSTAZOL 100 MG PO TABS: 100.0000 mg | ORAL_TABLET | Freq: Two times a day (BID) | ORAL | 1 refills | Status: DC

## 2018-06-20 NOTE — Progress Notes (Signed)
Cardiology Office Note   Date:  06/20/2018   ID:  Sydney Gross, DOB [birth year], MRN [id number]  PCP:  Lupita Raider, MD  Cardiologist:   Dr. Katrinka Blazing  No chief complaint on file.     History of Present Illness: Sydney Gross is a 84 y.o. female who was referred by Dr. Katrinka Blazing for evaluation and management of peripheral arterial disease.  She has known history of supravalvular aortic stenosis, hypertension and chronic diastolic heart failure. She is known to have history of peripheral arterial disease.  She had an ABI done in 2015 which was in the 0.7 range bilaterally.  She has been followed by Dr. Imogene Burn at [facility] for this and has been treated conservatively with cilostazol.  She reports some worsening of bilateral calf discomfort which is equal at both sides.  The pain happens after walking 1 block and forces her to stop for a few minutes before you she can resume.  No rest pain or lower extremity ulceration.  She is not diabetic and does not smoke.  She reports stable exertional dyspnea with no chest pain. She underwent repeat vascular studies in our office in June which showed an ABI of 0.83 on the right and 0.80 on the left. Duplex showed occluded right SFA and occluded left SFA and above-the-knee popliteal artery.  Past Medical History:  Diagnosis Date  . Allergic rhinitis   . Arthritis    "maybe a little in my knees and back" (12/10/2013)  . Colitis 12/2013   "never had this before"  . COPD (chronic obstructive pulmonary disease) (HCC)   . DOE (dyspnea on exertion)   . Exertional shortness of breath    "off and on" (12/10/2013)  . Family history of anesthesia complication    "mother; think they gave her too much; c/o pain after OR; gave her more RX; they had to bring her back real quick"  . H/O calcium pyrophosphate deposition disease (CPPD)   . Hiatal hernia   . Hyperlipidemia   . Hypertension   . Numbness in feet   . PAD (peripheral artery disease) (HCC)   . Proteinuria    . Shingles   . Stenosis of tear duct     Past Surgical History:  Procedure Laterality Date  . ABDOMINAL HYSTERECTOMY  1960's  . BACK SURGERY    . CESAREAN SECTION  1957; 1960  . CHOLECYSTECTOMY  ~ 2009  . DACROCYSTORHINOSTOMY W/ JONES TUBE Bilateral 05/2017  . eyelid lift Bilateral 05/2017  . LUMBAR DISC SURGERY  ?2004  . TONSILLECTOMY AND ADENOIDECTOMY  ?1940's     Current Outpatient Medications  Medication Sig Dispense Refill  . acetaminophen (TYLENOL) 500 MG tablet Take 500 mg by mouth every 6 (six) hours as needed for moderate pain.    [name] albuterol (PROVENTIL) (2.5 MG/3ML) 0.083% nebulizer solution Take 3 mLs (2.5 mg total) by nebulization every 4 (four) hours as needed for wheezing or shortness of breath. 75 mL 0  . aspirin EC 81 MG tablet Take 81 mg by mouth every evening.     . budesonide-formoterol (SYMBICORT) 160-4.5 MCG/ACT inhaler Inhale 2 puffs into the lungs 2 (two) times daily.    . Cholecalciferol (VITAMIN D PO) Take 1 capsule by mouth every evening.    . cilostazol (PLETAL) 100 MG tablet Take 1 tablet (100 mg total) by mouth 2 (two) times daily. 180 tablet 1  . Denosumab (PROLIA [place]) Use as directed.    . diphenhydramine-acetaminophen (TYLENOL PM) 25-500 MG  TABS Take 1 tablet by mouth at bedtime as needed (pain/sleep).     . Evolocumab (REPATHA) 140 MG/ML SOSY Inject 140 mg into the skin every 14 (fourteen) days.    [name]. lisinopril-hydrochlorothiazide (PRINZIDE,ZESTORETIC) 20-25 MG tablet Take 1 tablet by mouth daily. Take half (1/2) tablet by mouth daily every evening.    . metoprolol tartrate (LOPRESSOR) 25 MG tablet Take 0.5 tablets (12.5 mg total) by mouth 2 (two) times daily. Please keep upcoming appt with Dr. Katrinka BlazingSmith in June for future refills.Thank you 90 tablet 0  . naproxen (NAPROSYN) 500 MG tablet Take 500 mg by mouth as needed for moderate pain.    [name]. spironolactone (ALDACTONE) 25 MG tablet Take 0.5 tablets (12.5 mg total) by mouth daily. 45 tablet 3   No  current facility-administered medications for this visit.     Allergies:   Codeine; Crestor [rosuvastatin]; Darvon [propoxyphene]; Lipitor [atorvastatin]; and Zocor [simvastatin]    Social History:  The patient  reports that she has never smoked. She has never used smokeless tobacco. She reports that she drinks about 2.4 oz of alcohol per week. She reports that she does not use drugs.   Family History:  The patient's family history includes CVA in her mother; Cancer in her brother; Emphysema (age of onset: 3369) in her father.    ROS:  Please see the history of present illness.   Otherwise, review of systems are positive for none.   All other systems are reviewed and negative.    PHYSICAL EXAM: VS:  BP 117/75 (BP Location: Right Arm)   Pulse 63   Ht 5' (1.524 m)   Wt 118 lb (53.5 kg)   BMI 23.05 kg/m  , BMI Body mass index is 23.05 kg/m. GEN: Well nourished, well developed, in no acute distress  HEENT: normal  Neck: no JVD, carotid bruits, or masses Cardiac: RRR; no  rubs, or gallops,no edema .  1 out of 6 systolic murmur in the aortic area Respiratory:  clear to auscultation bilaterally, normal work of breathing GI: soft, nontender, nondistended, + BS MS: no deformity or atrophy  Skin: warm and dry, no rash Neuro:  Strength and sensation are intact Psych: euthymic mood, full affect Vascular: Femoral pulses normal bilaterally.  Distal pulses are not palpable.   EKG:  EKG is not ordered today.    Recent Labs: 05/29/2018: NT-Pro BNP 495 06/12/2018: BUN 24; Creatinine, Ser 1.24; Potassium 4.4; Sodium 142    Lipid Panel No results found for: CHOL, TRIG, HDL, CHOLHDL, VLDL, LDLCALC, LDLDIRECT    Wt Readings from Last 3 Encounters:  06/20/18 118 lb (53.5 kg)  05/29/18 119 lb 6.4 oz (54.2 kg)  08/25/17 123 lb 8 oz (56 kg)      No flowsheet data found.    ASSESSMENT AND PLAN:  1.  Peripheral arterial disease with moderate bilateral calf claudication due to known  chronically occluded SFA: Her ABIs have been stable over the last 4 to 5 years.  I discussed with her the natural history and management of intermittent claudication.  Given chronicity and long occlusion, the chance of endovascular success with good long-term patency is overall low and this should be reserved for critical limb ischemia.  Surgical revascularization is also not warranted. Due to that, I recommend continuing medical therapy, treatment with cilostazol and a walking program.  I discussed with her the importance of a regular walking regimen at least 30 to 45 minutes daily. I elected to increase cilostazol to 100 mg  twice daily. Given that she is not diabetic and does not smoke, the chance of progression is overall low.  2.  Essential hypertension: Blood pressures controlled on current medications.  3.  Hyperlipidemia: Intolerant to statins and currently on Evolocumab.    Disposition:   FU with me in 6 months  Signed,  Lorine Bears, MD  06/20/2018 9:17 AM    [facility]

## 2018-06-20 NOTE — Patient Instructions (Signed)
Medication Instructions: INCREASE the Cilostazol (Pletal) to 100 mg twice daily  If you need a refill on your cardiac medications before your next appointment, please call your pharmacy.   Follow-Up: Your physician wants you to follow-up in 6 months with Dr. Kirke CorinArida.   Thank you for choosing [facility] at [facility]!!

## 2018-06-21 ENCOUNTER — Ambulatory Visit (HOSPITAL_COMMUNITY): Payer: Medicare Other | Attending: Cardiology

## 2018-06-21 DIAGNOSIS — R9439 Abnormal result of other cardiovascular function study: Secondary | ICD-10-CM | POA: Insufficient documentation

## 2018-06-21 DIAGNOSIS — Q253 Supravalvular aortic stenosis: Secondary | ICD-10-CM

## 2018-06-21 DIAGNOSIS — R0602 Shortness of breath: Secondary | ICD-10-CM

## 2018-06-21 LAB — MYOCARDIAL PERFUSION IMAGING
LV dias vol: 52 mL (ref 46–106)
LV sys vol: 12 mL
Peak HR: 88 {beats}/min
RATE: 0.24
Rest HR: 61 {beats}/min
SDS: 11
SRS: 4
SSS: 15
TID: 1.06

## 2018-06-21 MED ORDER — TECHNETIUM TC 99M TETROFOSMIN IV KIT
33.0000 | PACK | Freq: Once | INTRAVENOUS | Status: AC | PRN
  Administered 2018-06-21: 33 via INTRAVENOUS
  Filled 2018-06-21: qty 33

## 2018-06-21 MED ORDER — REGADENOSON 0.4 MG/5ML IV SOLN
0.4000 mg | Freq: Once | INTRAVENOUS | Status: AC
  Administered 2018-06-21: 0.4 mg via INTRAVENOUS

## 2018-06-21 MED ORDER — TECHNETIUM TC 99M TETROFOSMIN IV KIT
10.0000 | PACK | Freq: Once | INTRAVENOUS | Status: AC | PRN
  Administered 2018-06-21: 10 via INTRAVENOUS
  Filled 2018-06-21: qty 10

## 2018-07-05 ENCOUNTER — Encounter: Payer: Self-pay | Admitting: Interventional Cardiology

## 2018-07-05 ENCOUNTER — Ambulatory Visit: Payer: Medicare Other | Admitting: Interventional Cardiology

## 2018-07-05 VITALS — BP 130/68 | HR 64 | Ht 59.0 in | Wt 120.0 lb

## 2018-07-05 DIAGNOSIS — R9439 Abnormal result of other cardiovascular function study: Secondary | ICD-10-CM

## 2018-07-05 DIAGNOSIS — I350 Nonrheumatic aortic (valve) stenosis: Secondary | ICD-10-CM | POA: Diagnosis not present

## 2018-07-05 DIAGNOSIS — I739 Peripheral vascular disease, unspecified: Secondary | ICD-10-CM

## 2018-07-05 DIAGNOSIS — R0609 Other forms of dyspnea: Secondary | ICD-10-CM

## 2018-07-05 DIAGNOSIS — J441 Chronic obstructive pulmonary disease with (acute) exacerbation: Secondary | ICD-10-CM

## 2018-07-05 DIAGNOSIS — I10 Essential (primary) hypertension: Secondary | ICD-10-CM

## 2018-07-05 DIAGNOSIS — R0600 Dyspnea, unspecified: Secondary | ICD-10-CM

## 2018-07-05 NOTE — Patient Instructions (Addendum)
Medication Instructions:  Your physician recommends that you continue on your current medications as directed. Please refer to the Current Medication list given to you today.  Labwork: BMET and CBC today  Testing/Procedures: Your physician has requested that you have a cardiac catheterization. Cardiac catheterization is used to diagnose and/or treat various heart conditions. Doctors may recommend this procedure for a number of different reasons. The most common reason is to evaluate chest pain. Chest pain can be a symptom of coronary artery disease (CAD), and cardiac catheterization can show whether plaque is narrowing or blocking your heart's arteries. This procedure is also used to evaluate the valves, as well as measure the blood flow and oxygen levels in different parts of your heart. For further information please visit [email or web address]. Please follow instruction sheet, as given.   Follow-Up: Your physician recommends that you schedule a follow-up appointment in: 2-3 weeks after heart catheterization with PA or NP. (Cath scheduled for 8/12)   Any Other Special Instructions Will Be Listed Below (If Applicable).     [facility] [address], [address] [place] [address] Dept: [phone] Loc: [phone]  Sydney Gross  07/05/2018  You are scheduled for a Cardiac Catheterization on Monday, August 12 with Dr. Verdis PrimeHenry Gross.  1. [facility] at [facility]: [address] [place], [place] [address] at 10:00 AM (This time is two hours before your procedure to ensure your preparation). Free valet parking service is available.   Special note: Every effort is made to have your procedure done on time. Please understand that emergencies sometimes delay scheduled procedures.  2. Diet: Do not eat solid foods after midnight.  The patient may have clear  liquids until 5am upon the day of the procedure.  3. Labs: You will have labs drawn today  4. Medication instructions in preparation for your procedure:   Contrast Allergy: No   Hold your Lisinopril/HCTZ, Spironolactone and Naproxen the morning of your procedure.   On the morning of your procedure, take your Aspirin and any morning medicines NOT listed above.  You may use sips of water.  5. Plan for one night stay--bring personal belongings. 6. Bring a current list of your medications and current insurance cards. 7. You MUST have a responsible person to drive you home. 8. Someone MUST be with you the first 24 hours after you arrive home or your discharge will be delayed. 9. Please wear clothes that are easy to get on and off and wear slip-on shoes.  Thank you for allowing us to care for you!   -- [facility]    If you need a refill on your cardiac medications before your next appointment, please call your pharmacy.

## 2018-07-05 NOTE — Progress Notes (Signed)
Cardiology Office Note:    Date:  07/05/2018   ID:  LARIN DEPAOLI, DOB 07-Apr-1930, MRN [id number]  PCP:  Lupita Raider, MD  Cardiologist:  No primary care provider on file.   Referring MD: Lupita Raider, MD   Chief Complaint  Patient presents with  . Shortness of Breath  . Chest Pain    History of Present Illness:    Sydney Gross is a 84 y.o. female with a hx of Supravalvular aortic stenosis, familial hypercholesterolemia with LDL cholesterol greater than 400, PAD, hypertension, diastolic heart failure (stable) , and chronic dyspnea.  She was in mid June and the major complaint dyspnea on exertion impacting quality of life.  She also has limiting claudication in both counts.  She is seeing Dr. Renato Gails for this.  Evaluation was performed including a BNP which was elevated at 495.  A myocardial perfusion study was done and demonstrated inferobasal defect possibly suggestive of ischemic heart disease.  The study was interpreted as intermediate risk.  Recently she had sent a staircase that had 30 stairs.  At the top of the staircase she states that was aching in her chest that lasted less than 5 minutes.  Shortness of breath was significant.  Dyspnea has been described as possibly related to COPD, but only smoke exposure has been to secondary smoke from her follow-up with me to be a years ago.   Past Medical History:  Diagnosis Date  . Allergic rhinitis   . Arthritis    "maybe a little in my knees and back" (12/10/2013)  . Colitis 12/2013   "never had this before"  . COPD (chronic obstructive pulmonary disease) (HCC)   . DOE (dyspnea on exertion)   . Exertional shortness of breath    "off and on" (12/10/2013)  . Family history of anesthesia complication    "mother; think they gave her too much; c/o pain after OR; gave her more RX; they had to bring her back real quick"  . H/O calcium pyrophosphate deposition disease (CPPD)   . Hiatal hernia   . Hyperlipidemia   .  Hypertension   . Numbness in feet   . PAD (peripheral artery disease) (HCC)   . Proteinuria   . Shingles   . Stenosis of tear duct     Past Surgical History:  Procedure Laterality Date  . ABDOMINAL HYSTERECTOMY  1960's  . BACK SURGERY    . CESAREAN SECTION  1957; 1960  . CHOLECYSTECTOMY  ~ 2009  . DACROCYSTORHINOSTOMY W/ JONES TUBE Bilateral 05/2017  . eyelid lift Bilateral 05/2017  . LUMBAR DISC SURGERY  ?2004  . TONSILLECTOMY AND ADENOIDECTOMY  ?1940's    Current Medications: Current Meds  Medication Sig  . acetaminophen (TYLENOL) 500 MG tablet Take 500 mg by mouth every 6 (six) hours as needed for moderate pain.  [name] albuterol (PROVENTIL) (2.5 MG/3ML) 0.083% nebulizer solution Take 3 mLs (2.5 mg total) by nebulization every 4 (four) hours as needed for wheezing or shortness of breath.  [name] aspirin EC 81 MG tablet Take 81 mg by mouth every evening.   . budesonide-formoterol (SYMBICORT) 160-4.5 MCG/ACT inhaler Inhale 2 puffs into the lungs 2 (two) times daily.  . Cholecalciferol (VITAMIN D PO) Take 1 capsule by mouth every evening.  . cilostazol (PLETAL) 100 MG tablet Take 1 tablet (100 mg total) by mouth 2 (two) times daily.  . Denosumab (PROLIA ) Use as directed.  . diphenhydramine-acetaminophen (TYLENOL PM) 25-500 MG TABS Take 1 tablet by mouth  at bedtime as needed (pain/sleep).   . Evolocumab (REPATHA) 140 MG/ML SOSY Inject 140 mg into the skin every 14 (fourteen) days.  [name]. lisinopril-hydrochlorothiazide (PRINZIDE,ZESTORETIC) 20-25 MG tablet Take 1 tablet by mouth daily. Take half (1/2) tablet by mouth daily every evening.  . metoprolol tartrate (LOPRESSOR) 25 MG tablet Take 0.5 tablets (12.5 mg total) by mouth 2 (two) times daily. Please keep upcoming appt with Dr. Katrinka BlazingSmith in June for future refills.Thank you  . naproxen (NAPROSYN) 500 MG tablet Take 500 mg by mouth as needed for moderate pain.  [name]. spironolactone (ALDACTONE) 25 MG tablet Take 0.5 tablets (12.5 mg total) by mouth  daily.     Allergies:   Codeine; Crestor [rosuvastatin]; Darvon [propoxyphene]; Lipitor [atorvastatin]; and Zocor [simvastatin]   Social History   Socioeconomic History  . Marital status: Married    Spouse name: Not on file  . Number of children: Not on file  . Years of education: Not on file  . Highest education level: Not on file  Occupational History  . Not on file  Social Needs  . Financial resource strain: Not on file  . Food insecurity:    Worry: Not on file    Inability: Not on file  . Transportation needs:    Medical: Not on file    Non-medical: Not on file  Tobacco Use  . Smoking status: Never Smoker  . Smokeless tobacco: Never Used  Substance and Sexual Activity  . Alcohol use: Yes    Alcohol/week: 2.4 oz    Types: 4 Glasses of wine per week    Comment: glass of wine every other night  . Drug use: No  . Sexual activity: Never  Lifestyle  . Physical activity:    Days per week: Not on file    Minutes per session: Not on file  . Stress: Not on file  Relationships  . Social connections:    Talks on phone: Not on file    Gets together: Not on file    Attends religious service: Not on file    Active member of club or organization: Not on file    Attends meetings of clubs or organizations: Not on file    Relationship status: Not on file  Other Topics Concern  . Not on file  Social History Narrative  . Not on file     Family History: The patient's family history includes CVA in her mother; Cancer in her brother; Emphysema (age of onset: 4069) in her father.  ROS:   Please see the history of present illness.    Muscle pain, and shortness of breath or any major complaints.  All other systems reviewed and are negative.  EKGs/Labs/Other Studies Reviewed:    The following studies were reviewed today: Myocardial perfusion study 17th 2019: Study Highlights      Nuclear stress EF: 76% with basal septal hypokinesis.  There was no ST segment deviation noted  during stress.  Defect 1: There is a medium defect of moderate severity present in the basal inferoseptal location. This defect was reversible and may be consistent with ischemia.  This is an intermediate risk study. Basal inferoseptal wall ischemia identified.   2D Doppler echocardiogram May 2018: Study Conclusions  - Left ventricle: The cavity size was normal. Wall thickness was   increased in a pattern of mild LVH. Systolic function was   vigorous. The estimated ejection fraction was in the range of 65%   to 70%. Wall motion was normal; there were  no regional wall   motion abnormalities. Features are consistent with a pseudonormal   left ventricular filling pattern, with concomitant abnormal   relaxation and increased filling pressure (grade 2 diastolic   dysfunction). - Left atrium: The atrium was moderately dilated. - Tricuspid valve: There was moderate regurgitation. - Pulmonary arteries: Systolic pressure was moderately increased.   PA peak pressure: 41 mm Hg (S).  BNP 495 May 29, 2018  Lower extremity vascular Doppler study 2019: Final Interpretation: Right: Occlusion of the superficial femoral artery. Reconstituted flow via collaterals in the distal superficial femoral artery.  Left: Occlusion of the superfical femoral artery. There appears to be brief reconstitution of flow in the distal superficial femoral artery via collateral, with a possible second short-segment occlusion at the above-knee popliteal artery (reconstituted  flow at the mid/distal popliteal).   See table(s) above for measurements and observations. See arterial doppler report.    Vascular consult recommended. Patient is scheduled to see Dr. Kirke Corin on 06/20/2018. Electronically signed by Lance Muss MD on 06/03/2018 at 11:07:04 AM.  EKG:  EKG is  ordered today.  Recent Labs: 05/29/2018: NT-Pro BNP 495 06/12/2018: BUN 24; Creatinine, Ser 1.24; Potassium 4.4; Sodium 142  Recent Lipid Panel No  results found for: CHOL, TRIG, HDL, CHOLHDL, VLDL, LDLCALC, LDLDIRECT  Physical Exam:    VS:  BP 130/68   Pulse 64   Ht 4\' 11"  (1.499 m)   Wt 120 lb (54.4 kg)   BMI 24.24 kg/m     Wt Readings from Last 3 Encounters:  07/05/18 120 lb (54.4 kg)  06/21/18 119 lb (54 kg)  06/20/18 118 lb (53.5 kg)     GEN: Appears younger than stated age.  Well nourished, well developed in no acute distress HEENT: Normal NECK: No JVD. LYMPHATICS: No lymphadenopathy CARDIAC: RRR, 2/6 crescendo decrescendo right upper sternal border systolic murmur, no gallop, no edema. VASCULAR: Diminished pedal  pulses.  No bruits. RESPIRATORY:  Clear to auscultation without rales, wheezing or rhonchi  ABDOMEN: Soft, non-tender, non-distended, No pulsatile mass, MUSCULOSKELETAL: No deformity  SKIN: Warm and dry NEUROLOGIC:  Alert and oriented x 3 PSYCHIATRIC:  Normal affect   ASSESSMENT:    1. Dyspnea on exertion   2. Abnormal nuclear stress test   3. Nonrheumatic aortic valve stenosis   4. Essential hypertension   5. COPD exacerbation (HCC)   6. PAD (peripheral artery disease) (HCC)    PLAN:    In order of problems listed above:  1. To further evaluate dyspnea, chest aching with exertion, and abnormal nuclear perfusion study, I have recommended left and right heart catheterization with coronary angiography.  Procedure and risks including stroke, death, myocardial infarction.  Were discussed in detail.  Risk of bleeding also discussed with patient.  Small stature may require that femoral approach be attempted but PVD we will hold for up avoiding femoral access. 2. Nuclear study suggests a defect. 3. Heart cath will help assess significance of aortic outflow obstruction. 4. Blood pressure is adequately controlled.  Target less than 140/90 mmHg. 5. Clinical diagnosis but I am uncertain if there is any objective evidence for COPD.  Multiple prior x-ray interpretations raise the question of COPD. 6. She is  limited by exertional claudication.  She is seeing Dr. Kirke Corin and a walking program has been prescribed.  She has been unable to make any progress because of the severe nature of claudication.  The patient was counseled to undergo left heart catheterization, coronary angiography, and possible percutaneous coronary  intervention with stent implantation. The procedural risks and benefits were discussed in detail. The risks discussed included death, stroke, myocardial infarction, life-threatening bleeding, limb ischemia, kidney injury, allergy, and possible emergency cardiac surgery. The risk of these significant complications were estimated to occur less than 1% of the time. After discussion, the patient has agreed to proceed.   Medication Adjustments/Labs and Tests Ordered: Current medicines are reviewed at length with the patient today.  Concerns regarding medicines are outlined above.  Orders Placed This Encounter  Procedures  . Basic metabolic panel  . CBC   No orders of the defined types were placed in this encounter.   Patient Instructions  Medication Instructions:  Your physician recommends that you continue on your current medications as directed. Please refer to the Current Medication list given to you today.  Labwork: BMET and CBC today  Testing/Procedures: Your physician has requested that you have a cardiac catheterization. Cardiac catheterization is used to diagnose and/or treat various heart conditions. Doctors may recommend this procedure for a number of different reasons. The most common reason is to evaluate chest pain. Chest pain can be a symptom of coronary artery disease (CAD), and cardiac catheterization can show whether plaque is narrowing or blocking your heart's arteries. This procedure is also used to evaluate the valves, as well as measure the blood flow and oxygen levels in different parts of your heart. For further information please visit [email or web address] Please  follow instruction sheet, as given.   Follow-Up: Your physician recommends that you schedule a follow-up appointment in: 2-3 weeks after heart catheterization with PA or NP. (Cath scheduled for 8/12)   Any Other Special Instructions Will Be Listed Below (If Applicable).     [facility] CARDIOVASCULAR DIVISION [facility] ST OFFICE [address] [place] [address] Dept: [phone] Loc: [phone]  BELMA DYCHES  07/05/2018  You are scheduled for a Cardiac Catheterization on Monday, August 12 with Dr. [name].  1. Please arrive at the [facility] (Main Entrance A) at [facility]: [address] [place], [place] [address] at 10:00 AM (This time is two hours before your procedure to ensure your preparation). Free valet parking service is available.   Special note: Every effort is made to have your procedure done on time. Please understand that emergencies sometimes delay scheduled procedures.  2. Diet: Do not eat solid foods after midnight.  The patient may have clear liquids until 5am upon the day of the procedure.  3. Labs: You will have labs drawn today  4. Medication instructions in preparation for your procedure:   Contrast Allergy: No   Hold your Lisinopril/HCTZ, Spironolactone and Naproxen the morning of your procedure.   On the morning of your procedure, take your Aspirin and any morning medicines NOT listed above.  You may use sips of water.  5. Plan for one night stay--bring personal belongings. 6. Bring a current list of your medications and current insurance cards. 7. You MUST have a responsible person to drive you home. 8. Someone MUST be with you the first 24 hours after you arrive home or your discharge will be delayed. 9. Please wear clothes that are easy to get on and off and wear slip-on shoes.  Thank you for allowing [name] to care for you!   -- [facility]  [facility]    If you need a refill on your cardiac medications before your next appointment, please call your pharmacy.  Signed, Lesleigh Noe, MD  07/05/2018 5:36 PM    [facility]

## 2018-07-05 NOTE — H&P (View-Only) (Signed)
Cardiology Office Note:    Date:  07/05/2018   ID:  Sydney Gross, DOB 1930/02/01, MRN [id number]  PCP:  Lupita Raider, MD  Cardiologist:  No primary care provider on file.   Referring MD: Lupita Raider, MD   Chief Complaint  Patient presents with  . Shortness of Breath  . Chest Pain    History of Present Illness:    Sydney Gross is a 84 y.o. female with a hx of Supravalvular aortic stenosis, familial hypercholesterolemia with LDL cholesterol greater than 400, PAD, hypertension, diastolic heart failure (stable) , and chronic dyspnea.  She was in mid June and the major complaint dyspnea on exertion impacting quality of life.  She also has limiting claudication in both counts.  She is seeing Dr. Renato Gails for this.  Evaluation was performed including a BNP which was elevated at 495.  A myocardial perfusion study was done and demonstrated inferobasal defect possibly suggestive of ischemic heart disease.  The study was interpreted as intermediate risk.  Recently she had sent a staircase that had 30 stairs.  At the top of the staircase she states that was aching in her chest that lasted less than 5 minutes.  Shortness of breath was significant.  Dyspnea has been described as possibly related to COPD, but only smoke exposure has been to secondary smoke from her follow-up with me to be a years ago.   Past Medical History:  Diagnosis Date  . Allergic rhinitis   . Arthritis    "maybe a little in my knees and back" (12/10/2013)  . Colitis 12/2013   "never had this before"  . COPD (chronic obstructive pulmonary disease) (HCC)   . DOE (dyspnea on exertion)   . Exertional shortness of breath    "off and on" (12/10/2013)  . Family history of anesthesia complication    "mother; think they gave her too much; c/o pain after OR; gave her more RX; they had to bring her back real quick"  . H/O calcium pyrophosphate deposition disease (CPPD)   . Hiatal hernia   . Hyperlipidemia   .  Hypertension   . Numbness in feet   . PAD (peripheral artery disease) (HCC)   . Proteinuria   . Shingles   . Stenosis of tear duct     Past Surgical History:  Procedure Laterality Date  . ABDOMINAL HYSTERECTOMY  1960's  . BACK SURGERY    . CESAREAN SECTION  1957; 1960  . CHOLECYSTECTOMY  ~ 2009  . DACROCYSTORHINOSTOMY W/ JONES TUBE Bilateral 05/2017  . eyelid lift Bilateral 05/2017  . LUMBAR DISC SURGERY  ?2004  . TONSILLECTOMY AND ADENOIDECTOMY  ?1940's    Current Medications: Current Meds  Medication Sig  . acetaminophen (TYLENOL) 500 MG tablet Take 500 mg by mouth every 6 (six) hours as needed for moderate pain.  [name] albuterol (PROVENTIL) (2.5 MG/3ML) 0.083% nebulizer solution Take 3 mLs (2.5 mg total) by nebulization every 4 (four) hours as needed for wheezing or shortness of breath.  [name] aspirin EC 81 MG tablet Take 81 mg by mouth every evening.   . budesonide-formoterol (SYMBICORT) 160-4.5 MCG/ACT inhaler Inhale 2 puffs into the lungs 2 (two) times daily.  . Cholecalciferol (VITAMIN D PO) Take 1 capsule by mouth every evening.  . cilostazol (PLETAL) 100 MG tablet Take 1 tablet (100 mg total) by mouth 2 (two) times daily.  . Denosumab (PROLIA [place]) Use as directed.  . diphenhydramine-acetaminophen (TYLENOL PM) 25-500 MG TABS Take 1 tablet by mouth  at bedtime as needed (pain/sleep).   . Evolocumab (REPATHA) 140 MG/ML SOSY Inject 140 mg into the skin every 14 (fourteen) days.  [name]. lisinopril-hydrochlorothiazide (PRINZIDE,ZESTORETIC) 20-25 MG tablet Take 1 tablet by mouth daily. Take half (1/2) tablet by mouth daily every evening.  . metoprolol tartrate (LOPRESSOR) 25 MG tablet Take 0.5 tablets (12.5 mg total) by mouth 2 (two) times daily. Please keep upcoming appt with Dr. Katrinka BlazingSmith in June for future refills.Thank you  . naproxen (NAPROSYN) 500 MG tablet Take 500 mg by mouth as needed for moderate pain.  [name]. spironolactone (ALDACTONE) 25 MG tablet Take 0.5 tablets (12.5 mg total) by mouth  daily.     Allergies:   Codeine; Crestor [rosuvastatin]; Darvon [propoxyphene]; Lipitor [atorvastatin]; and Zocor [simvastatin]   Social History   Socioeconomic History  . Marital status: Married    Spouse name: Not on file  . Number of children: Not on file  . Years of education: Not on file  . Highest education level: Not on file  Occupational History  . Not on file  Social Needs  . Financial resource strain: Not on file  . Food insecurity:    Worry: Not on file    Inability: Not on file  . Transportation needs:    Medical: Not on file    Non-medical: Not on file  Tobacco Use  . Smoking status: Never Smoker  . Smokeless tobacco: Never Used  Substance and Sexual Activity  . Alcohol use: Yes    Alcohol/week: 2.4 oz    Types: 4 Glasses of wine per week    Comment: glass of wine every other night  . Drug use: No  . Sexual activity: Never  Lifestyle  . Physical activity:    Days per week: Not on file    Minutes per session: Not on file  . Stress: Not on file  Relationships  . Social connections:    Talks on phone: Not on file    Gets together: Not on file    Attends religious service: Not on file    Active member of club or organization: Not on file    Attends meetings of clubs or organizations: Not on file    Relationship status: Not on file  Other Topics Concern  . Not on file  Social History Narrative  . Not on file     Family History: The patient's family history includes CVA in her mother; Cancer in her brother; Emphysema (age of onset: 4069) in her father.  ROS:   Please see the history of present illness.    Muscle pain, and shortness of breath or any major complaints.  All other systems reviewed and are negative.  EKGs/Labs/Other Studies Reviewed:    The following studies were reviewed today: Myocardial perfusion study 17th 2019: Study Highlights      Nuclear stress EF: 76% with basal septal hypokinesis.  There was no ST segment deviation noted  during stress.  Defect 1: There is a medium defect of moderate severity present in the basal inferoseptal location. This defect was reversible and may be consistent with ischemia.  This is an intermediate risk study. Basal inferoseptal wall ischemia identified.   2D Doppler echocardiogram May 2018: Study Conclusions  - Left ventricle: The cavity size was normal. Wall thickness was   increased in a pattern of mild LVH. Systolic function was   vigorous. The estimated ejection fraction was in the range of 65%   to 70%. Wall motion was normal; there were  no regional wall   motion abnormalities. Features are consistent with a pseudonormal   left ventricular filling pattern, with concomitant abnormal   relaxation and increased filling pressure (grade 2 diastolic   dysfunction). - Left atrium: The atrium was moderately dilated. - Tricuspid valve: There was moderate regurgitation. - Pulmonary arteries: Systolic pressure was moderately increased.   PA peak pressure: 41 mm Hg (S).  BNP 495 May 29, 2018  Lower extremity vascular Doppler study 2019: Final Interpretation: Right: Occlusion of the superficial femoral artery. Reconstituted flow via collaterals in the distal superficial femoral artery.  Left: Occlusion of the superfical femoral artery. There appears to be brief reconstitution of flow in the distal superficial femoral artery via collateral, with a possible second short-segment occlusion at the above-knee popliteal artery (reconstituted  flow at the mid/distal popliteal).   See table(s) above for measurements and observations. See arterial doppler report.    Vascular consult recommended. Patient is scheduled to see Dr. Kirke Corin on 06/20/2018. Electronically signed by Lance Muss MD on 06/03/2018 at 11:07:04 AM.  EKG:  EKG is  ordered today.  Recent Labs: 05/29/2018: NT-Pro BNP 495 06/12/2018: BUN 24; Creatinine, Ser 1.24; Potassium 4.4; Sodium 142  Recent Lipid Panel No  results found for: CHOL, TRIG, HDL, CHOLHDL, VLDL, LDLCALC, LDLDIRECT  Physical Exam:    VS:  BP 130/68   Pulse 64   Ht 4\' 11"  (1.499 m)   Wt 120 lb (54.4 kg)   BMI 24.24 kg/m     Wt Readings from Last 3 Encounters:  07/05/18 120 lb (54.4 kg)  06/21/18 119 lb (54 kg)  06/20/18 118 lb (53.5 kg)     GEN: Appears younger than stated age.  Well nourished, well developed in no acute distress HEENT: Normal NECK: No JVD. LYMPHATICS: No lymphadenopathy CARDIAC: RRR, 2/6 crescendo decrescendo right upper sternal border systolic murmur, no gallop, no edema. VASCULAR: Diminished pedal  pulses.  No bruits. RESPIRATORY:  Clear to auscultation without rales, wheezing or rhonchi  ABDOMEN: Soft, non-tender, non-distended, No pulsatile mass, MUSCULOSKELETAL: No deformity  SKIN: Warm and dry NEUROLOGIC:  Alert and oriented x 3 PSYCHIATRIC:  Normal affect   ASSESSMENT:    1. Dyspnea on exertion   2. Abnormal nuclear stress test   3. Nonrheumatic aortic valve stenosis   4. Essential hypertension   5. COPD exacerbation (HCC)   6. PAD (peripheral artery disease) (HCC)    PLAN:    In order of problems listed above:  1. To further evaluate dyspnea, chest aching with exertion, and abnormal nuclear perfusion study, I have recommended left and right heart catheterization with coronary angiography.  Procedure and risks including stroke, death, myocardial infarction.  Were discussed in detail.  Risk of bleeding also discussed with patient.  Small stature may require that femoral approach be attempted but PVD we will hold for up avoiding femoral access. 2. Nuclear study suggests a defect. 3. Heart cath will help assess significance of aortic outflow obstruction. 4. Blood pressure is adequately controlled.  Target less than 140/90 mmHg. 5. Clinical diagnosis but I am uncertain if there is any objective evidence for COPD.  Multiple prior x-ray interpretations raise the question of COPD. 6. She is  limited by exertional claudication.  She is seeing Dr. Kirke Corin and a walking program has been prescribed.  She has been unable to make any progress because of the severe nature of claudication.  The patient was counseled to undergo left heart catheterization, coronary angiography, and possible percutaneous coronary  intervention with stent implantation. The procedural risks and benefits were discussed in detail. The risks discussed included death, stroke, myocardial infarction, life-threatening bleeding, limb ischemia, kidney injury, allergy, and possible emergency cardiac surgery. The risk of these significant complications were estimated to occur less than 1% of the time. After discussion, the patient has agreed to proceed.   Medication Adjustments/Labs and Tests Ordered: Current medicines are reviewed at length with the patient today.  Concerns regarding medicines are outlined above.  Orders Placed This Encounter  Procedures  . Basic metabolic panel  . CBC   No orders of the defined types were placed in this encounter.   Patient Instructions  Medication Instructions:  Your physician recommends that you continue on your current medications as directed. Please refer to the Current Medication list given to you today.  Labwork: BMET and CBC today  Testing/Procedures: Your physician has requested that you have a cardiac catheterization. Cardiac catheterization is used to diagnose and/or treat various heart conditions. Doctors may recommend this procedure for a number of different reasons. The most common reason is to evaluate chest pain. Chest pain can be a symptom of coronary artery disease (CAD), and cardiac catheterization can show whether plaque is narrowing or blocking your heart's arteries. This procedure is also used to evaluate the valves, as well as measure the blood flow and oxygen levels in different parts of your heart. For further information please visit [email or web address] Please  follow instruction sheet, as given.   Follow-Up: Your physician recommends that you schedule a follow-up appointment in: 2-3 weeks after heart catheterization with PA or NP. (Cath scheduled for 8/12)   Any Other Special Instructions Will Be Listed Below (If Applicable).     [facility] CARDIOVASCULAR DIVISION [facility] ST OFFICE [address], [address] [place] [address] Dept: [phone] Loc: [phone]  Sydney Gross  07/05/2018  You are scheduled for a Cardiac Catheterization on Monday, August 12 with Dr. [name].  1. Please arrive at the [facility] (Main Entrance A) at [facility]: [address] [place], [place] [address] at 10:00 AM (This time is two hours before your procedure to ensure your preparation). Free valet parking service is available.   Special note: Every effort is made to have your procedure done on time. Please understand that emergencies sometimes delay scheduled procedures.  2. Diet: Do not eat solid foods after midnight.  The patient may have clear liquids until 5am upon the day of the procedure.  3. Labs: You will have labs drawn today  4. Medication instructions in preparation for your procedure:   Contrast Allergy: No   Hold your Lisinopril/HCTZ, Spironolactone and Naproxen the morning of your procedure.   On the morning of your procedure, take your Aspirin and any morning medicines NOT listed above.  You may use sips of water.  5. Plan for one night stay--bring personal belongings. 6. Bring a current list of your medications and current insurance cards. 7. You MUST have a responsible person to drive you home. 8. Someone MUST be with you the first 24 hours after you arrive home or your discharge will be delayed. 9. Please wear clothes that are easy to get on and off and wear slip-on shoes.  Thank you for allowing [name] to care for you!   -- [facility]  [facility]    If you need a refill on your cardiac medications before your next appointment, please call your pharmacy.  Signed, Lesleigh Noe, MD  07/05/2018 5:36 PM    [facility]

## 2018-07-06 LAB — BASIC METABOLIC PANEL
BUN/Creatinine Ratio: 34 — ABNORMAL HIGH (ref 12–28)
BUN: 29 mg/dL — ABNORMAL HIGH (ref 8–27)
CO2: 23 mmol/L (ref 20–29)
Calcium: 9.8 mg/dL (ref 8.7–10.3)
Chloride: 105 mmol/L (ref 96–106)
Creatinine, Ser: 0.85 mg/dL (ref 0.57–1.00)
GFR calc Af Amer: 72 mL/min/{1.73_m2} (ref >59)
GFR calc non Af Amer: 63 mL/min/{1.73_m2} (ref >59)
Glucose: 96 mg/dL (ref 65–99)
Potassium: 5 mmol/L (ref 3.5–5.2)
Sodium: 142 mmol/L (ref 134–144)

## 2018-07-06 LAB — CBC
Hematocrit: 35.3 % (ref 34.0–46.6)
Hemoglobin: 11.6 g/dL (ref 11.1–15.9)
MCH: 31.6 pg (ref 26.6–33.0)
MCHC: 32.9 g/dL (ref 31.5–35.7)
MCV: 96 fL (ref 79–97)
Platelets: 237 10*3/uL (ref 150–450)
RBC: 3.67 x10E6/uL — ABNORMAL LOW (ref 3.77–5.28)
RDW: 14.2 % (ref 12.3–15.4)
WBC: 9.6 10*3/uL (ref 3.4–10.8)

## 2018-07-13 ENCOUNTER — Telehealth: Payer: Self-pay | Admitting: *Deleted

## 2018-07-13 NOTE — Telephone Encounter (Signed)
Pt contacted [facility] for: Monday July 13, 2018 12 noon Ver[facility] and [facility] at: 10 AM  No solid food after midnight prior to cath, clear liquids until 5 AM day of procedure. Verified allergies in Epic   Hold: Spironolactone -PM prior to procedure. Lisinopril-HCTZ -PM prior to procedure  Except hold medications AM meds can be  taken pre-cath with sip of water including: ASA 81 mg  Confirmed patient has responsible person to drive home post procedure and for 24 hours after you arrive home.: yes

## 2018-07-17 ENCOUNTER — Other Ambulatory Visit: Payer: Self-pay

## 2018-07-17 ENCOUNTER — Encounter (HOSPITAL_COMMUNITY)
Admission: RE | Disposition: A | Discharge status: Home / Self Care | Payer: Self-pay | Source: Ambulatory Visit | Attending: Interventional Cardiology

## 2018-07-17 ENCOUNTER — Ambulatory Visit (HOSPITAL_COMMUNITY)
Admission: RE | Admit: 2018-07-17 | Discharge: 2018-07-18 | Disposition: A | Discharge status: Home / Self Care | Payer: Medicare Other | Source: Ambulatory Visit | Attending: Interventional Cardiology | Admitting: Interventional Cardiology

## 2018-07-17 ENCOUNTER — Encounter (HOSPITAL_COMMUNITY): Payer: Self-pay | Admitting: Interventional Cardiology

## 2018-07-17 DIAGNOSIS — Z885 Allergy status to narcotic agent status: Secondary | ICD-10-CM | POA: Diagnosis not present

## 2018-07-17 DIAGNOSIS — I350 Nonrheumatic aortic (valve) stenosis: Secondary | ICD-10-CM | POA: Diagnosis not present

## 2018-07-17 DIAGNOSIS — I25118 Atherosclerotic heart disease of native coronary artery with other forms of angina pectoris: Secondary | ICD-10-CM | POA: Diagnosis not present

## 2018-07-17 DIAGNOSIS — I110 Hypertensive heart disease with heart failure: Secondary | ICD-10-CM | POA: Diagnosis not present

## 2018-07-17 DIAGNOSIS — M1990 Unspecified osteoarthritis, unspecified site: Secondary | ICD-10-CM | POA: Diagnosis not present

## 2018-07-17 DIAGNOSIS — Z7982 Long term (current) use of aspirin: Secondary | ICD-10-CM | POA: Insufficient documentation

## 2018-07-17 DIAGNOSIS — I5032 Chronic diastolic (congestive) heart failure: Secondary | ICD-10-CM | POA: Diagnosis not present

## 2018-07-17 DIAGNOSIS — J441 Chronic obstructive pulmonary disease with (acute) exacerbation: Secondary | ICD-10-CM | POA: Diagnosis not present

## 2018-07-17 DIAGNOSIS — Z955 Presence of coronary angioplasty implant and graft: Secondary | ICD-10-CM

## 2018-07-17 DIAGNOSIS — J449 Chronic obstructive pulmonary disease, unspecified: Secondary | ICD-10-CM | POA: Diagnosis present

## 2018-07-17 DIAGNOSIS — I9581 Postprocedural hypotension: Secondary | ICD-10-CM

## 2018-07-17 DIAGNOSIS — R0609 Other forms of dyspnea: Secondary | ICD-10-CM | POA: Diagnosis present

## 2018-07-17 DIAGNOSIS — Z7951 Long term (current) use of inhaled steroids: Secondary | ICD-10-CM | POA: Insufficient documentation

## 2018-07-17 DIAGNOSIS — R0600 Dyspnea, unspecified: Secondary | ICD-10-CM | POA: Diagnosis present

## 2018-07-17 DIAGNOSIS — I77810 Thoracic aortic ectasia: Secondary | ICD-10-CM | POA: Diagnosis present

## 2018-07-17 DIAGNOSIS — E7801 Familial hypercholesterolemia: Secondary | ICD-10-CM | POA: Diagnosis not present

## 2018-07-17 DIAGNOSIS — I70219 Atherosclerosis of native arteries of extremities with intermittent claudication, unspecified extremity: Secondary | ICD-10-CM | POA: Diagnosis present

## 2018-07-17 DIAGNOSIS — I2510 Atherosclerotic heart disease of native coronary artery without angina pectoris: Secondary | ICD-10-CM | POA: Diagnosis present

## 2018-07-17 DIAGNOSIS — Q253 Supravalvular aortic stenosis: Secondary | ICD-10-CM

## 2018-07-17 HISTORY — DX: Pneumonia, unspecified organism: J18.9

## 2018-07-17 HISTORY — DX: Dependence on supplemental oxygen: Z99.81

## 2018-07-17 HISTORY — DX: Low back pain, unspecified: M54.50

## 2018-07-17 HISTORY — DX: Cardiac murmur, unspecified: R01.1

## 2018-07-17 HISTORY — DX: Low back pain: M54.5

## 2018-07-17 HISTORY — DX: Other chronic pain: G89.29

## 2018-07-17 HISTORY — PX: RIGHT/LEFT HEART CATH AND CORONARY ANGIOGRAPHY: CATH118266

## 2018-07-17 HISTORY — PX: CORONARY STENT INTERVENTION: CATH118234

## 2018-07-17 LAB — POCT ACTIVATED CLOTTING TIME
Activated Clotting Time: 252 seconds
Activated Clotting Time: 263 seconds
Activated Clotting Time: 356 seconds
Activated Clotting Time: 263 seconds

## 2018-07-17 LAB — POCT I-STAT 3, ART BLOOD GAS (G3+)
Acid-base deficit: 4 mmol/L — ABNORMAL HIGH (ref 0.0–2.0)
Bicarbonate: 21.9 mmol/L (ref 20.0–28.0)
O2 Saturation: 100 %
TCO2: 23 mmol/L (ref 22–32)
pCO2 arterial: 42.1 mmHg (ref 32.0–48.0)
pH, Arterial: 7.324 — ABNORMAL LOW (ref 7.350–7.450)
pO2, Arterial: 225 mmHg — ABNORMAL HIGH (ref 83.0–108.0)

## 2018-07-17 LAB — POCT I-STAT 3, VENOUS BLOOD GAS (G3P V)
Acid-base deficit: 5 mmol/L — ABNORMAL HIGH (ref 0.0–2.0)
Acid-base deficit: 5 mmol/L — ABNORMAL HIGH (ref 0.0–2.0)
Bicarbonate: 21.2 mmol/L (ref 20.0–28.0)
Bicarbonate: 22 mmol/L (ref 20.0–28.0)
O2 Saturation: 76 %
O2 Saturation: 77 %
TCO2: 22 mmol/L (ref 22–32)
TCO2: 23 mmol/L (ref 22–32)
pCO2, Ven: 42.7 mmHg — ABNORMAL LOW (ref 44.0–60.0)
pCO2, Ven: 46.4 mmHg (ref 44.0–60.0)
pH, Ven: 7.284 (ref 7.250–7.430)
pH, Ven: 7.304 (ref 7.250–7.430)
pO2, Ven: 46 mmHg — ABNORMAL HIGH (ref 32.0–45.0)
pO2, Ven: 46 mmHg — ABNORMAL HIGH (ref 32.0–45.0)

## 2018-07-17 LAB — CBC
HCT: 36.4 % (ref 36.0–46.0)
Hemoglobin: 11.4 g/dL — ABNORMAL LOW (ref 12.0–15.0)
MCH: 31.5 pg (ref 26.0–34.0)
MCHC: 31.3 g/dL (ref 30.0–36.0)
MCV: 100.6 fL — ABNORMAL HIGH (ref 78.0–100.0)
Platelets: 188 10*3/uL (ref 150–400)
RBC: 3.62 MIL/uL — ABNORMAL LOW (ref 3.87–5.11)
RDW: 13 % (ref 11.5–15.5)
WBC: 8.7 10*3/uL (ref 4.0–10.5)

## 2018-07-17 LAB — GLUCOSE, CAPILLARY: Glucose-Capillary: 73 mg/dL (ref 70–99)

## 2018-07-17 LAB — CREATININE, SERUM
Creatinine, Ser: 0.88 mg/dL (ref 0.44–1.00)
GFR calc Af Amer: >60 mL/min (ref >60)
GFR calc non Af Amer: 58 mL/min — ABNORMAL LOW (ref >60)

## 2018-07-17 SURGERY — RIGHT/LEFT HEART CATH AND CORONARY ANGIOGRAPHY
Anesthesia: LOCAL

## 2018-07-17 MED ORDER — HEPARIN (PORCINE) IN NACL 1000-0.9 UT/500ML-% IV SOLN
INTRAVENOUS | Status: DC | PRN
  Administered 2018-07-17 (×2): 500 mL

## 2018-07-17 MED ORDER — SODIUM CHLORIDE 0.9% FLUSH: 3.0000 mL | Freq: Two times a day (BID) | INTRAVENOUS | Status: DC

## 2018-07-17 MED ORDER — FENTANYL CITRATE (PF) 100 MCG/2ML IJ SOLN
INTRAMUSCULAR | Status: AC
  Filled 2018-07-17: qty 2

## 2018-07-17 MED ORDER — SODIUM CHLORIDE 0.9 % IV SOLN: 250.0000 mL | INTRAVENOUS | Status: DC | PRN

## 2018-07-17 MED ORDER — METOPROLOL TARTRATE 12.5 MG HALF TABLET
12.5000 mg | ORAL_TABLET | Freq: Two times a day (BID) | ORAL | Status: DC
  Administered 2018-07-17 – 2018-07-18 (×2): 12.5 mg via ORAL
  Filled 2018-07-17 (×3): qty 1

## 2018-07-17 MED ORDER — SODIUM CHLORIDE 0.9 % IV SOLN
INTRAVENOUS | Status: AC | PRN
  Administered 2018-07-17: 250 mL via INTRAVENOUS

## 2018-07-17 MED ORDER — ANGIOPLASTY BOOK
Freq: Once | Status: AC
  Administered 2018-07-17: 22:00:00 1
  Filled 2018-07-17: qty 1

## 2018-07-17 MED ORDER — HEPARIN (PORCINE) IN NACL 2-0.9 UNITS/ML
INTRAMUSCULAR | Status: DC | PRN
  Administered 2018-07-17: 10 mL via INTRA_ARTERIAL

## 2018-07-17 MED ORDER — SODIUM CHLORIDE 0.9% FLUSH: 3.0000 mL | INTRAVENOUS | Status: DC | PRN

## 2018-07-17 MED ORDER — HEPARIN (PORCINE) IN NACL 1000-0.9 UT/500ML-% IV SOLN
INTRAVENOUS | Status: AC
  Filled 2018-07-17: qty 1000

## 2018-07-17 MED ORDER — ACETAMINOPHEN 500 MG PO TABS: 500.0000 mg | ORAL_TABLET | Freq: Two times a day (BID) | ORAL | Status: DC | PRN

## 2018-07-17 MED ORDER — ENSURE ENLIVE PO LIQD
237.0000 mL | Freq: Two times a day (BID) | ORAL | Status: DC
  Administered 2018-07-18: 10:00:00 237 mL via ORAL
  Filled 2018-07-17 (×2): qty 237

## 2018-07-17 MED ORDER — NITROGLYCERIN 1 MG/10 ML FOR IR/CATH LAB
INTRA_ARTERIAL | Status: DC | PRN
  Administered 2018-07-17: 100 ug via INTRACORONARY

## 2018-07-17 MED ORDER — HYDROCHLOROTHIAZIDE 25 MG PO TABS
25.0000 mg | ORAL_TABLET | Freq: Every day | ORAL | Status: DC
  Filled 2018-07-17: qty 1

## 2018-07-17 MED ORDER — VERAPAMIL HCL 2.5 MG/ML IV SOLN
INTRAVENOUS | Status: AC
  Filled 2018-07-17: qty 2

## 2018-07-17 MED ORDER — CILOSTAZOL 100 MG PO TABS
100.0000 mg | ORAL_TABLET | Freq: Two times a day (BID) | ORAL | Status: DC
  Administered 2018-07-17 – 2018-07-18 (×2): 100 mg via ORAL
  Filled 2018-07-17 (×3): qty 1

## 2018-07-17 MED ORDER — HEPARIN SODIUM (PORCINE) 1000 UNIT/ML IJ SOLN
INTRAMUSCULAR | Status: AC
  Filled 2018-07-17: qty 1

## 2018-07-17 MED ORDER — IOPAMIDOL (ISOVUE-370) INJECTION 76%
INTRAVENOUS | Status: DC | PRN
  Administered 2018-07-17: 280 mL via INTRA_ARTERIAL

## 2018-07-17 MED ORDER — TICAGRELOR 90 MG PO TABS
90.0000 mg | ORAL_TABLET | Freq: Two times a day (BID) | ORAL | Status: DC
  Administered 2018-07-18 (×2): 90 mg via ORAL
  Filled 2018-07-17 (×2): qty 1

## 2018-07-17 MED ORDER — TICAGRELOR 90 MG PO TABS
ORAL_TABLET | ORAL | Status: DC | PRN
  Administered 2018-07-17: 180 mg via ORAL

## 2018-07-17 MED ORDER — ONDANSETRON HCL 4 MG/2ML IJ SOLN
4.0000 mg | Freq: Four times a day (QID) | INTRAMUSCULAR | Status: DC | PRN
  Administered 2018-07-17: 18:00:00 4 mg via INTRAVENOUS
  Filled 2018-07-17: qty 2

## 2018-07-17 MED ORDER — IOPAMIDOL (ISOVUE-370) INJECTION 76%
INTRAVENOUS | Status: AC
  Filled 2018-07-17: qty 100

## 2018-07-17 MED ORDER — MIDAZOLAM HCL 2 MG/2ML IJ SOLN
INTRAMUSCULAR | Status: AC
  Filled 2018-07-17: qty 2

## 2018-07-17 MED ORDER — ASPIRIN 81 MG PO CHEW: 81.0000 mg | CHEWABLE_TABLET | ORAL | Status: DC

## 2018-07-17 MED ORDER — SODIUM CHLORIDE 0.9 % WEIGHT BASED INFUSION
1.9000 mL/kg/h | INTRAVENOUS | Status: AC
  Administered 2018-07-17: 1.9 mL/kg/h via INTRAVENOUS

## 2018-07-17 MED ORDER — HYDRALAZINE HCL 20 MG/ML IJ SOLN
5.0000 mg | INTRAMUSCULAR | Status: AC | PRN
  Filled 2018-07-17: qty 1

## 2018-07-17 MED ORDER — HEPARIN SODIUM (PORCINE) 1000 UNIT/ML IJ SOLN
INTRAMUSCULAR | Status: DC | PRN
  Administered 2018-07-17: 2500 [IU] via INTRAVENOUS
  Administered 2018-07-17: 2000 [IU] via INTRAVENOUS
  Administered 2018-07-17: 1500 [IU] via INTRAVENOUS
  Administered 2018-07-17: 3000 [IU] via INTRAVENOUS
  Administered 2018-07-17: 6000 [IU] via INTRAVENOUS

## 2018-07-17 MED ORDER — ASPIRIN 81 MG PO CHEW
81.0000 mg | CHEWABLE_TABLET | Freq: Every day | ORAL | Status: DC
  Administered 2018-07-18: 09:00:00 81 mg via ORAL
  Filled 2018-07-17: qty 1

## 2018-07-17 MED ORDER — SODIUM CHLORIDE 0.9 % WEIGHT BASED INFUSION: 1.0000 mL/kg/h | INTRAVENOUS | Status: DC

## 2018-07-17 MED ORDER — MIDAZOLAM HCL 2 MG/2ML IJ SOLN
INTRAMUSCULAR | Status: DC | PRN
  Administered 2018-07-17 (×4): 0.5 mg via INTRAVENOUS

## 2018-07-17 MED ORDER — LABETALOL HCL 5 MG/ML IV SOLN
10.0000 mg | INTRAVENOUS | Status: AC | PRN
  Administered 2018-07-17: 10 mg via INTRAVENOUS
  Filled 2018-07-17: qty 4

## 2018-07-17 MED ORDER — MORPHINE SULFATE (PF) 2 MG/ML IV SOLN
2.0000 mg | INTRAVENOUS | Status: DC | PRN
  Administered 2018-07-17 (×2): 2 mg via INTRAVENOUS
  Filled 2018-07-17 (×2): qty 1

## 2018-07-17 MED ORDER — SODIUM CHLORIDE 0.9 % WEIGHT BASED INFUSION
3.0000 mL/kg/h | INTRAVENOUS | Status: DC
  Administered 2018-07-17: 3 mL/kg/h via INTRAVENOUS

## 2018-07-17 MED ORDER — EVOLOCUMAB 140 MG/ML ~~LOC~~ SOSY: 140.0000 mg | PREFILLED_SYRINGE | SUBCUTANEOUS | Status: DC

## 2018-07-17 MED ORDER — LISINOPRIL 10 MG PO TABS
20.0000 mg | ORAL_TABLET | Freq: Every day | ORAL | Status: DC
  Filled 2018-07-17: qty 1

## 2018-07-17 MED ORDER — FENTANYL CITRATE (PF) 100 MCG/2ML IJ SOLN
INTRAMUSCULAR | Status: DC | PRN
  Administered 2018-07-17 (×5): 25 ug via INTRAVENOUS

## 2018-07-17 MED ORDER — ACETAMINOPHEN 325 MG PO TABS: 650.0000 mg | ORAL_TABLET | ORAL | Status: DC | PRN

## 2018-07-17 MED ORDER — HEPARIN (PORCINE) IN NACL 2000-0.9 UNIT/L-% IV SOLN: INTRAVENOUS | Status: DC | PRN

## 2018-07-17 MED ORDER — LIDOCAINE HCL (PF) 1 % IJ SOLN
INTRAMUSCULAR | Status: AC
  Filled 2018-07-17: qty 30

## 2018-07-17 MED ORDER — LISINOPRIL-HYDROCHLOROTHIAZIDE 20-25 MG PO TABS: 1.0000 | ORAL_TABLET | Freq: Every day | ORAL | Status: DC

## 2018-07-17 MED ORDER — LIDOCAINE HCL (PF) 1 % IJ SOLN
INTRAMUSCULAR | Status: DC | PRN
  Administered 2018-07-17: 5 mL

## 2018-07-17 MED ORDER — HEPARIN SODIUM (PORCINE) 5000 UNIT/ML IJ SOLN
5000.0000 [IU] | Freq: Three times a day (TID) | INTRAMUSCULAR | Status: DC
  Administered 2018-07-18: 07:00:00 5000 [IU] via SUBCUTANEOUS
  Filled 2018-07-17: qty 1

## 2018-07-17 MED ORDER — DIPHENHYDRAMINE HCL 25 MG PO CAPS
25.0000 mg | ORAL_CAPSULE | Freq: Every evening | ORAL | Status: DC | PRN
  Administered 2018-07-17: 22:00:00 25 mg via ORAL
  Filled 2018-07-17: qty 1

## 2018-07-17 MED ORDER — TICAGRELOR 90 MG PO TABS
ORAL_TABLET | ORAL | Status: AC
  Filled 2018-07-17: qty 2

## 2018-07-17 SURGICAL SUPPLY — 22 items
BALLN SAPPHIRE 2.5X12 (BALLOONS) ×2
BALLN SAPPHIRE ~~LOC~~ 2.75X15 (BALLOONS) ×2 IMPLANT
BALLOON SAPPHIRE 2.5X12 (BALLOONS) ×1 IMPLANT
CATH BALLN WEDGE 5F 110CM (CATHETERS) ×2 IMPLANT
CATH INFINITI 5 FR JL3.5 (CATHETERS) ×2 IMPLANT
CATH INFINITI JR4 5F (CATHETERS) ×2 IMPLANT
CATH VISTA GUIDE 6FR XBLAD3.0 (CATHETERS) ×2 IMPLANT
DEVICE RAD COMP TR BAND LRG (VASCULAR PRODUCTS) ×2 IMPLANT
GLIDESHEATH SLEND A-KIT 6F 22G (SHEATH) ×2 IMPLANT
GUIDEWIRE INQWIRE 1.5J.035X260 (WIRE) ×1 IMPLANT
INQWIRE 1.5J .035X260CM (WIRE) ×2
KIT ENCORE 26 ADVANTAGE (KITS) ×2 IMPLANT
KIT HEART LEFT (KITS) ×2 IMPLANT
KIT HEMO VALVE WATCHDOG (MISCELLANEOUS) ×2 IMPLANT
PACK CARDIAC CATHETERIZATION (CUSTOM PROCEDURE TRAY) ×2 IMPLANT
SHEATH GLIDE SLENDER 4/5FR (SHEATH) ×2 IMPLANT
SHEATH PROBE COVER 6X72 (BAG) ×2 IMPLANT
STENT SYNERGY DES 2.25X20 (Permanent Stent) ×2 IMPLANT
STENT SYNERGY DES 2.5X28 (Permanent Stent) ×2 IMPLANT
TRANSDUCER W/STOPCOCK (MISCELLANEOUS) ×2 IMPLANT
TUBING CIL FLEX 10 FLL-RA (TUBING) ×2 IMPLANT
WIRE ASAHI PROWATER 180CM (WIRE) ×6 IMPLANT

## 2018-07-17 NOTE — Progress Notes (Addendum)
INTERVENTIONAL CARDIOLOGY   The team is had difficulty all afternoon achieving hemostasis in the right radial puncture site.  The pneumatic cuff is still on now 5 hours post procedure.  Approximately 1.5 hours ago the patient was up to the bathroom, developed nausea, and significant pain in the right forearm and antecubital area.  Blood pressure dropped to 89/49 mmHg.  Hypotension was treated with recumbency and IV fluid bolus.  Clinical observation demonstrated a "tennis ball "sized hematoma in the upper medial forearm. This apparently developed relatively acutely.  Patient did complain upon leaving the Cath Lab of upper right forearm throbbing discomfort near the elbow.  No evidence of hematoma was noted at that time.  Compressive therapy using a blood pressure cuff over the hematoma for approximately 40 minutes at 50 mmHg.  This measures related to softening of the forearm.  Right index index finger oximetry was noted to be pulsatile during the entire duration of inflation.  Fingers are pink but there is also evidence of hematoma in the dorsum of the right arm below the wrist band.  This area is also soft.  Cuff removed after 40 minutes. Forearm is softer, and no evidence of recurrent bleeding after 30 minutes of observation.  Encouraged the team to continue working on the wristband and get it off within the next 30 to 60 minutes if possible.  40 minutes spent evaluating the arm and managing care.

## 2018-07-17 NOTE — Interval H&P Note (Signed)
Cath Lab Visit (complete for each Cath Lab visit)  Clinical Evaluation Leading to the Procedure:   ACS: No.  Non-ACS:    Anginal Classification: CCS III  Anti-ischemic medical therapy: Maximal Therapy (2 or more classes of medications)  Non-Invasive Test Results: No non-invasive testing performed  Prior CABG: No previous CABG      History and Physical Interval Note:  07/17/2018 11:36 AM  Sydney Gross  has presented today for surgery, with the diagnosis of abnormal nuc  The various methods of treatment have been discussed with the patient and family. After consideration of risks, benefits and other options for treatment, the patient has consented to  Procedure(s): RIGHT/LEFT HEART CATH AND CORONARY ANGIOGRAPHY (N/A) as a surgical intervention .  The patient's history has been reviewed, patient examined, no change in status, stable for surgery.  I have reviewed the patient's chart and labs.  Questions were answered to the patient's satisfaction.     Lyn RecordsHenry W Smith III

## 2018-07-17 NOTE — Progress Notes (Signed)
  #   8.  S/W  KATHLEEN @ [facility] # [id number]    BRILINTA  90 MG BID  COVER- YES  CO-PAY- $ 370.67  TIER-0 3 DRUG  PRIOR APPROVAL- NO   NO DEDUCTIBLE  PATIENT IN INITIAL COVERAGE GAP   PREFERRED PHARMACY : YES   [facility]

## 2018-07-17 NOTE — Progress Notes (Signed)
Dr Katrinka BlazingSmith is here at this time assessing right arm and speaking with patient.

## 2018-07-17 NOTE — Progress Notes (Signed)
Manual Blood Pressure cuff applied to the right arm hematoma site, covering forearm and medial antecubital hematomas. Cuff inflated to 50mmHg. With waveform showing on the pleth (finger pleth put on the right thumb. Dr Katrinka BlazingSmith notified at this time.

## 2018-07-17 NOTE — Progress Notes (Signed)
Dr Katrinka BlazingSmith at bedside assessing the right arm. Blood Pressure cuff applied back to right arm at 40-7050mmHg (with a waveforem on the O2 pleth on the right thumb). Keep this on for 30 minutes per Dr. Katrinka BlazingSmith.

## 2018-07-17 NOTE — Research (Signed)
CADFEM Informed Consent   Subject Name: Sydney Gross  Subject met inclusion and exclusion criteria.  The informed consent form, study requirements and expectations were reviewed with the subject and questions and concerns were addressed prior to the signing of the consent form.  The subject verbalized understanding of the trail requirements.  The subject agreed to participate in the CADFEM trial and signed the informed consent.  The informed consent was obtained prior to performance of any protocol-specific procedures for the subject.  A copy of the signed informed consent was given to the subject and a copy was placed in the subject's medical record.  [name] 07/17/2018, 11:23 AM

## 2018-07-17 NOTE — Progress Notes (Addendum)
Site area: right brachial  Site Prior to Removal:  Level 1  Pressure Applied For 15  MINUTES    Minutes Beginning at 1745  Manual:   Yes.    Patient Status During Pull:  stable  Post Pull Brachial Site:  Level 2  Post Pull Instructions Given:  Yes.    Post Pull Pulses Present:  Yes.    Dressing Applied:  Yes.    Comments:

## 2018-07-17 NOTE — Progress Notes (Signed)
Blood Pressure cuff removed to assess the hematoma site. Hematoma is softer and reduced in size. Forearm is still firm but smaller and softer in size than prior to cuff placement.

## 2018-07-17 NOTE — Progress Notes (Signed)
TR BAND REMOVAL  LOCATION:    right radial  DEFLATED PER PROTOCOL:    Yes.    TIME BAND OFF / DRESSING APPLIED:    21:45   SITE UPON ARRIVAL:    Level 1   SITE AFTER BAND REMOVAL:    Level 1  CIRCULATION SENSATION AND MOVEMENT:    Within Normal Limits   Yes.    COMMENTS:    Post TR band instructions given. Pt tolerated well.

## 2018-07-17 NOTE — Interval H&P Note (Signed)
Cath Lab Visit (complete for each Cath Lab visit)  Clinical Evaluation Leading to the Procedure:   ACS: No.  Non-ACS:    Anginal Classification: CCS Gross  Anti-ischemic medical therapy: Maximal Therapy (2 or more classes of medications)  Non-Invasive Test Results: No non-invasive testing performed  Prior CABG: No previous CABG      History and Physical Interval Note:  07/17/2018 12:06 PM  Sydney Gross  has presented today for surgery, with the diagnosis of abnormal nuc  The various methods of treatment have been discussed with the patient and family. After consideration of risks, benefits and other options for treatment, the patient has consented to  Procedure(s): RIGHT/LEFT HEART CATH AND CORONARY ANGIOGRAPHY (N/A) as a surgical intervention .  The patient's history has been reviewed, patient examined, no change in status, stable for surgery.  I have reviewed the patient's chart and labs.  Questions were answered to the patient's satisfaction.     Sydney Gross

## 2018-07-17 NOTE — Care Management (Signed)
07-17-18  BENEFITS CHECK :    #  8.   S/W   KATHLEEN @ [facility] # [id number]   BRILINTA   90 MG BID COVER- YES CO-PAY- $ 370.67 TIER-0 3 DRUG PRIOR APPROVAL- NO  NO DEDUCTIBLE PATIENT IN INITIAL COVERAGE GAP  PREFERRED  PHARMACY :  YES      [facility]

## 2018-07-17 NOTE — Progress Notes (Addendum)
Patient arrived on unit from cath lab at 1445.  Right brachial sheath sutured in place, level 0.  Right radial site level 1 with bruising.  Right hand engorged.  Radial site had several episodes of bleeding, air added to band per protocol.  Band with 22cc at 1810, and has since had 6 cc removed without bleeding.  Next deflation scheduled for 1930.  Hematoma developed in forearm proximal to brachial sheath at 1700.  Manual pressure applied for 15 minutes X 4.  Hematoma then reappeared and at 1850  cath lab personnel Justice Rocher(Alicia Stewart) placed blood pressure cuff on right forearm at 40-60 psi for 30 minutes per Dr. Katrinka BlazingSmith.  Cath lab team on the unit since 1800 to help manage both sites.Dr. Katrinka BlazingSmith examined patient at 1845 and again at [phone].

## 2018-07-18 ENCOUNTER — Telehealth: Payer: Self-pay | Admitting: Physician Assistant

## 2018-07-18 DIAGNOSIS — E7801 Familial hypercholesterolemia: Secondary | ICD-10-CM | POA: Diagnosis not present

## 2018-07-18 DIAGNOSIS — J441 Chronic obstructive pulmonary disease with (acute) exacerbation: Secondary | ICD-10-CM | POA: Diagnosis not present

## 2018-07-18 DIAGNOSIS — M1990 Unspecified osteoarthritis, unspecified site: Secondary | ICD-10-CM | POA: Diagnosis not present

## 2018-07-18 DIAGNOSIS — I25118 Atherosclerotic heart disease of native coronary artery with other forms of angina pectoris: Secondary | ICD-10-CM | POA: Diagnosis not present

## 2018-07-18 DIAGNOSIS — I350 Nonrheumatic aortic (valve) stenosis: Secondary | ICD-10-CM | POA: Diagnosis not present

## 2018-07-18 DIAGNOSIS — R0609 Other forms of dyspnea: Secondary | ICD-10-CM | POA: Diagnosis not present

## 2018-07-18 DIAGNOSIS — I70219 Atherosclerosis of native arteries of extremities with intermittent claudication, unspecified extremity: Secondary | ICD-10-CM | POA: Diagnosis not present

## 2018-07-18 DIAGNOSIS — Z7982 Long term (current) use of aspirin: Secondary | ICD-10-CM | POA: Diagnosis not present

## 2018-07-18 DIAGNOSIS — I110 Hypertensive heart disease with heart failure: Secondary | ICD-10-CM | POA: Diagnosis not present

## 2018-07-18 DIAGNOSIS — Z7951 Long term (current) use of inhaled steroids: Secondary | ICD-10-CM | POA: Diagnosis not present

## 2018-07-18 DIAGNOSIS — Z885 Allergy status to narcotic agent status: Secondary | ICD-10-CM | POA: Diagnosis not present

## 2018-07-18 DIAGNOSIS — I5032 Chronic diastolic (congestive) heart failure: Secondary | ICD-10-CM | POA: Diagnosis not present

## 2018-07-18 LAB — CBC
HCT: 32.1 % — ABNORMAL LOW (ref 36.0–46.0)
Hemoglobin: 10.2 g/dL — ABNORMAL LOW (ref 12.0–15.0)
MCH: 32 pg (ref 26.0–34.0)
MCHC: 31.8 g/dL (ref 30.0–36.0)
MCV: 100.6 fL — ABNORMAL HIGH (ref 78.0–100.0)
Platelets: 188 10*3/uL (ref 150–400)
RBC: 3.19 MIL/uL — ABNORMAL LOW (ref 3.87–5.11)
RDW: 13.2 % (ref 11.5–15.5)
WBC: 11.7 10*3/uL — ABNORMAL HIGH (ref 4.0–10.5)

## 2018-07-18 LAB — BASIC METABOLIC PANEL
Anion gap: 10 (ref 5–15)
BUN: 14 mg/dL (ref 8–23)
CO2: 21 mmol/L — ABNORMAL LOW (ref 22–32)
Calcium: 8.6 mg/dL — ABNORMAL LOW (ref 8.9–10.3)
Chloride: 108 mmol/L (ref 98–111)
Creatinine, Ser: 0.86 mg/dL (ref 0.44–1.00)
GFR calc Af Amer: >60 mL/min (ref >60)
GFR calc non Af Amer: 60 mL/min — ABNORMAL LOW (ref >60)
Glucose, Bld: 118 mg/dL — ABNORMAL HIGH (ref 70–99)
Potassium: 3.9 mmol/L (ref 3.5–5.1)
Sodium: 139 mmol/L (ref 135–145)

## 2018-07-18 MED ORDER — ASPIRIN 81 MG PO CHEW: 81.0000 mg | CHEWABLE_TABLET | Freq: Every day | ORAL | 3 refills | Status: DC

## 2018-07-18 MED ORDER — NITROGLYCERIN 0.4 MG SL SUBL: 0.4000 mg | SUBLINGUAL_TABLET | SUBLINGUAL | 3 refills | Status: DC | PRN

## 2018-07-18 MED ORDER — TICAGRELOR 90 MG PO TABS: 90.0000 mg | ORAL_TABLET | Freq: Two times a day (BID) | ORAL | 0 refills | Status: DC

## 2018-07-18 NOTE — Discharge Instructions (Signed)
PLEASE REMEMBER TO BRING ALL OF YOUR MEDICATIONS TO EACH OF YOUR FOLLOW-UP OFFICE VISITS.  PLEASE ATTEND ALL SCHEDULED FOLLOW-UP APPOINTMENTS.   Activity: Increase activity slowly as tolerated. You may shower, but no soaking baths (or swimming) for 1 week. No driving for 24 hours. No lifting over 5 lbs for 1 week. No sexual activity for 1 week.   You May Return to Work: in 1 week (if applicable)  Wound Care: You may wash cath site gently with soap and water. Keep cath site clean and dry. If you notice pain, swelling, bleeding or pus at your cath site, please call [phone].  Please do not take Pletal anymore.   It is important that you take your Brilinta 2 times per day and aspirin daily for the next month without missing doses. These medication will keep your stent open. Missing doses can put you at risk of forming blockages in your stent and could result in a heart attack. You will transition to aspirin and plavix after 1 month. You will need to get a prescription for plavix at your next follow-up visit.

## 2018-07-18 NOTE — Telephone Encounter (Signed)
The pt is being discharged today. Will call her tomorrow.

## 2018-07-18 NOTE — Telephone Encounter (Signed)
New Message     TOC on 08/08/2018 @ 9:00 with Sydney CarsonMichelle Lenze per Dot LanesKrista

## 2018-07-18 NOTE — Care Management Note (Addendum)
`  Case Management Note  Patient Details  Name: Theresia Loatricia D Schlender MRN: [id number] Date of Birth: 1930-06-04  Subjective/Objective:  From home, s/p stent intervention, will be on brilinta, per benefit check co pay of 370.67 ( in coverage gap), will give patient the 30 day free coupon, MD will discuss with patient at follow up apt about changing medications to something affordable.  Patient's pharmacy,- [facility] on [address] has the brilinta in stock.                Action/Plan: DC home when ready.  Expected Discharge Date:                  Expected Discharge Plan:  Home/Self Care  In-House Referral:     Discharge planning Services  CM Consult, Medication Assistance  Post Acute Care Choice:    Choice offered to:     DME Arranged:    DME Agency:     HH Arranged:    HH Agency:     Status of Service:  Completed, signed off  If discussed at [employer] Length of Stay Meetings, dates discussed:    Additional Comments:  Leone Havenaylor, Kaliann Coryell Clinton, RN 07/18/2018, 7:36 AM

## 2018-07-18 NOTE — Progress Notes (Signed)
CARDIAC REHAB PHASE I   PRE:  Rate/Rhythm: 88 SR    BP: sitting 127/45    SaO2: 97 RA  MODE:  Ambulation: 220 ft   POST:  Rate/Rhythm: 104 ST    BP: sitting 136/48     SaO2: 98 RA  Pt moving well, steady. She did begin to get SOB and did not want to increase her distance. When asked if her SOB was decreased from PTA, she sts "maybe a little bit". Ed completed including Brilinta/ASA. Encouraged to increase walking/exercise daily. She normally rides bike at the gym and does a balance class. To wait on balance class until she see MD. Will refer to [place] [facility].  [id number]   Sydney MassonRandi Kristan Charlisha Gross CES, ACSM 07/18/2018 9:11 AM

## 2018-07-18 NOTE — Discharge Summary (Signed)
Discharge Summary    Patient ID: CORNELIOUS DIVEN,  MRN: [id number], DOB/AGE: Aug 30, 1930 84 y.o.  Admit date: 07/17/2018 Discharge date: 07/18/2018  Primary Care Provider: Mayra Neer Primary Cardiologist: Sinclair Grooms, MD  Discharge Diagnoses    Principal Problem:   Dyspnea on exertion Active Problems:   Atherosclerosis of native arteries of extremity with intermittent claudication [facility])   Aortic stenosis, supravalvular   Dilated aortic root (HCC)   COPD exacerbation ([place])   Coronary artery disease of native artery of native heart with stable angina pectoris ([place])   CAD (coronary artery disease), native coronary artery   Allergies Allergies  Allergen Reactions  . Codeine     GI upset  . Crestor [Rosuvastatin] Other (See Comments)    MYALGIAS  . Darvon [Propoxyphene] Nausea Only  . Lipitor [Atorvastatin] Other (See Comments)    MYALGIAS  . Zocor [Simvastatin] Other (See Comments)    MYALGIAS    Diagnostic Studies/Procedures    Right/Left heart catheterization 07/17/18:  Coronary artery disease with high-grade obstruction in the mid LAD and proximal to mid first diagonal.  Normal left main.  Normal circumflex coronary artery.  Dominant RCA with luminal irregularities up to 40% distally.  Right heart pressures  Ventricular systolic function and LVEDP (14 mmHg).  Successful PCI and stent implantation in both the mid LAD and proximal to mid first diagonal.    The LAD 90% stenosis was reduced to 0% with TIMI grade III flow using a 2.5 x 28 Synergy postdilated to 2.75 mm in diameter.  TIMI grade III flow pre-and post stenting.  The first diagonal 90% stenosis was reduced to 0% using by 2.25 mm Synergy deployed at 14 atm which is not effective stent size of 2.32 mm.  TIMI grade III flow noted pre-and post PCI.  RECOMMENDATIONS:   Continue aggressive risk factor modification: PCSK9 therapy (Repatha), moderate aerobic physical activity, blood  pressure control less than 140/90 mmHg, and surveillance of glycemic control.  Aspirin and Brilinta x1 month.  Switch to aspirin and Plavix thereafter.  Total duration of dual antiplatelet therapy 6 months at which time Plavix can be dropped.  Discharge in a.m.  Recommend uninterrupted dual antiplatelet therapy with Aspirin 7m daily and Ticagrelor 961mtwice daily for a minimum of 6 months (stable ischemic heart disease - Class I recommendation). _____________   History of Present Illness     [age].o. female with a hx of Supravalvular aortic stenosis, familial hypercholesterolemia with LDL cholesterol greater than 400, PAD, hypertension, diastolic heart failure (stable) , and chronic dyspnea.  She was seen outpatient in mid June and the major complaint was dyspnea on exertion impacting quality of life.  She also had limiting claudication in both counts for which she is seeing Dr. ReMariea Clonts Evaluation was performed including a BNP which was elevated at 495.  A myocardial perfusion study was done and demonstrated inferobasal defect possibly suggestive of ischemic heart disease.  The study was interpreted as intermediate risk.  Recently she climbed a staircase that had 30 stairs and at the top of the staircase she stated that there was aching in her chest that lasted less than 5 minutes.  Shortness of breath was significant. Given abnormal NST and complaints of CP and DOE, she was recommended for outpatient R/LHC.    Hospital Course     Consultants: None   1. CAD: [facility] and complaints of CP and DOE. She was  found to have 90% stenosis of the LAD and proximal/mid first diagonal, both managed with PCI; also with 40% distal RCA disease. She was recommended to continue ASA and brilinta x1 month and transition to ASA and plavix thereafter for a minimum of 6 months (change in therapy due to cost of brilinta). Post-cath course was complicated by hematoma and  hypotension, both improved in the morning following cardiac cath. H/H stable post cath.  - Continue ASA and brilinta x1 month - Will likely need plavix load (676m) at time of transition to ASA and plavix therapy.   2. Chronic diastolic CHF: [facility] 662[facility]; right heart pressures consistent with pulmonary hypertension.  Appeared euvolemic at the time of discharge - Continue metoprolol, lisinopril-HCTZ  3. HTN: BP stable - Continue metoprolol, lisinopril-HCTZ  4. PAD: Followed by Dr. RMariea Clonts Pletal discontinued given need for ASA and brilinta>plavix - Continue DAPT and statin  5. 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    _____________  Discharge Vitals Blood pressure (!) 119/58, pulse 79, temperature 98.2 F (36.8 C), temperature source Oral, resp. rate 20, height '4\' 11"'  (1.499 m), weight 52 kg, SpO2 99 %.  Filed Weights   07/17/18 1011 07/18/18 0607  Weight: 52.6 kg 52 kg   Physical exam on the day of discharge:  GMBT:DHRCBUin bed in no acute distress.   Neck:No JVD, no carotid bruits Cardiac: RRR, +murmur, no rubs or gallops. Right radial cath site with significant ecchymosis and mild swelling but hematoma has resolved; ecchymosis and mild swelling in the right antecubital area as well.  Respiratory:Clear to auscultation bilaterally, no wheezes/ rales/ rhonchi GLA:GTXM Soft, nontender, non-distended  MS:No edema; No deformity. Neuro:Nonfocal, moving all extremities spontaneously Psych: Normal affect     Labs & Radiologic Studies    CBC Recent Labs    07/17/18 1509 07/18/18 0218  WBC 8.7 11.7*  HGB 11.4* 10.2*  HCT 36.4 32.1*  MCV 100.6* 100.6*  PLT 188 1468  Basic Metabolic Panel Recent Labs    07/17/18 1509 07/18/18 0218  NA  --  139  K  --  3.9  CL  --  108  CO2  --  21*  GLUCOSE  --  118*  BUN  --  14  CREATININE 0.88 0.86  CALCIUM  --  8.6*   Liver Function Tests No results for  input(s): AST, ALT, ALKPHOS, BILITOT, PROT, ALBUMIN in the last 72 hours. No results for input(s): LIPASE, AMYLASE in the last 72 hours. Cardiac Enzymes No results for input(s): CKTOTAL, CKMB, CKMBINDEX, TROPONINI in the last 72 hours. BNP Invalid input(s): POCBNP D-Dimer No results for input(s): DDIMER in the last 72 hours. Hemoglobin A1C No results for input(s): HGBA1C in the last 72 hours. Fasting Lipid Panel No results for input(s): CHOL, HDL, LDLCALC, TRIG, CHOLHDL, LDLDIRECT in the last 72 hours. Thyroid Function Tests No results for input(s): TSH, T4TOTAL, T3FREE, THYROIDAB in the last 72 hours.  Invalid input(s): FREET3 _____________  No results found. Disposition   Patient was seen and examined by Dr. CSallyanne Kusterwho deemed patient as stable for discharge. Follow-up has been arranged. Discharge medications as listed below.   Follow-up Plans & Appointments    Follow-up Information    LImogene Burn PA-C Follow up on 08/08/2018.   Specialty:  Cardiology Why:  Please arrive 15 minutes early for your 9:00am appointment.  Contact information: [phone]  Discharge Medications   Allergies as of 07/18/2018      Reactions   Codeine    GI upset   Crestor [rosuvastatin] Other (See Comments)   MYALGIAS   Darvon [propoxyphene] Nausea Only   Lipitor [atorvastatin] Other (See Comments)   MYALGIAS   Zocor [simvastatin] Other (See Comments)   MYALGIAS      Medication List    STOP taking these medications   cilostazol 100 MG tablet Commonly known as:  PLETAL   naproxen 500 MG tablet Commonly known as:  NAPROSYN     TAKE these medications   acetaminophen 500 MG tablet Commonly known as:  TYLENOL Take 500 mg by mouth 2 (two) times daily as needed for moderate pain or headache.   albuterol (2.5 MG/3ML) 0.083% nebulizer solution Commonly known as:  PROVENTIL Take 3 mLs (2.5 mg total) by  nebulization every 4 (four) hours as needed for wheezing or shortness of breath.   aspirin 81 MG chewable tablet Chew 1 tablet (81 mg total) by mouth daily.   cholecalciferol 1000 units tablet Commonly known as:  VITAMIN D Take 1,000 Units by mouth at bedtime.   diphenhydramine-acetaminophen 25-500 MG Tabs tablet Commonly known as:  TYLENOL PM Take 1 tablet by mouth at bedtime as needed (pain/sleep).   lisinopril-hydrochlorothiazide 20-25 MG tablet Commonly known as:  PRINZIDE,ZESTORETIC Take 1 tablet by mouth at bedtime.   metoprolol tartrate 25 MG tablet Commonly known as:  LOPRESSOR Take 0.5 tablets (12.5 mg total) by mouth 2 (two) times daily. Please keep upcoming appt with Dr. Tamala Julian in June for future refills.Thank you   nitroGLYCERIN 0.4 MG SL tablet Commonly known as:  NITROSTAT Place 1 tablet (0.4 mg total) under the tongue every 5 (five) minutes as needed for chest pain.   PROLIA [place] Inject 1 Dose as directed every 6 (six) months. Use as directed.   REPATHA 140 MG/ML [place] Generic drug:  Evolocumab Inject 140 mg into the skin every 14 (fourteen) days.   SYMBICORT 160-4.5 MCG/ACT inhaler Generic drug:  budesonide-formoterol Inhale 2 puffs into the lungs 2 (two) times daily.   ticagrelor 90 MG Tabs tablet Commonly known as:  BRILINTA Take 1 tablet (90 mg total) by mouth 2 (two) times daily.        Aspirin prescribed at discharge?  Yes High Intensity Statin Prescribed? (Lipitor 40-28m or Crestor 20-42m: No: Intolerant to statins; on repatha Beta Blocker Prescribed? Yes For EF <40%, was ACEI/ARB Prescribed? Yes ADP Receptor Inhibitor Prescribed? (i.e. Plavix etc.-Includes Medically Managed Patients): Yes  For EF <40%, Aldosterone Inhibitor Prescribed? No: EF >40% Was EF assessed during THIS hospitalization? Yes Was Cardiac Rehab II ordered? (Included Medically managed Patients): Yes   Outstanding Labs/Studies   Plan to continue ASA and brilinta x1 month,  then transition to ASA and plavix. Will need plavix load at the time of transition.   Duration of Discharge Encounter   Greater than 30 minutes including physician time.  Signed, KrAbigail ButtsA-C 07/18/2018, 8:34 AM   I have seen and examined the patient along with KrAbigail ButtsA-C.  I have reviewed the chart, notes and new data.  I agree with PA/NP's note.  Key new complaints: tender R wrist, no chest symptoms Key examination changes: large ecchymosis, but no protruding hematoma, soft forearm/wrist, good pulse and capillary refill R hand   PLAN: Discussed mandatory DAPT. Keep R hand elevated. Report worsening R hand symptoms promptly. DC today.  MiSanda KleinMD, FAHatfield  [phone] 07/18/2018, 10:18 AM

## 2018-07-19 NOTE — Telephone Encounter (Signed)
**Note De-Identified [name] Obfuscation** [facility].  Patient understands to follow up with provider Jacolyn ReedyMichele Lenze, PA-c on 08/08/18 at 9 am [address] [place]. Patient understands discharge instructions? Yes Patient understands medications and regiment? Yes Patient understands to bring all medications to this visit? Yes

## 2018-07-21 ENCOUNTER — Telehealth (HOSPITAL_COMMUNITY): Payer: Self-pay

## 2018-07-21 NOTE — Telephone Encounter (Signed)
Pt insurance is active and benefits verified through [place]. Co-pay $0.00, DED $0.00/$0.00 met, out of pocket $5,800.00/$1,014.82 met, co-insurance 0%. No pre-authorization. Gen/[employer], 07/21/18 @ 2:29PM, KCL#[id number]

## 2018-07-21 NOTE — Telephone Encounter (Signed)
Called patient to see if she was interested in participating in the Cardiac Rehab Program. Patient stated yes. Patient will come in for orientation on 08/29/18 @ 8:30AM and will attend the 9:45AM exercise class.  Mailed homework package.  went over insurance, patient verbalized understanding.

## 2018-08-02 DIAGNOSIS — I5032 Chronic diastolic (congestive) heart failure: Secondary | ICD-10-CM | POA: Insufficient documentation

## 2018-08-02 NOTE — Progress Notes (Signed)
Cardiology Office Note    Date:  08/08/2018   ID:  Sydney Gross, DOB 1930-04-17, MRN [id number]  PCP:  Mayra Neer, MD  Cardiologist: Sinclair Grooms, MD  No chief complaint on file.   History of Present Illness:  Sydney Gross is a 84 y.o. female with history of supravalvular aortic stenosis, familial hypercholesterolemia with LDL greater than 665, PAD, diastolic CHF, hypertension.  She had worsening symptoms of dyspnea on exertion and chest tightness with abnormal nuclear stress test for inferior basal defect consistent with ischemia.  Patient underwent right and left heart cath 07/17/2018 and underwent DES to the LAD and first diagonal with residual 40% RCA.  Plan for Brilinta plus aspirin for 1 month then transition to aspirin and Plavix for a minimum of 6 months.  Will need Plavix load 600 mg at time of transition to aspirin and Plavix.  Post cath was complicated by hematoma and hypotension.  LVEF 65% right heart pressures consistent with pulmonary hypertension.  She was euvolemic at discharge.  Also has PAD followed by Dr. Mariea Clonts but Pletal was discontinued given need for aspirin and Brilinta.  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  Patient comes in today accompanied by her husband.  She complains of dyspnea on exertion with little activity.  She did have a good day yesterday and was able to go shopping.  She denies chest pain.  She is not taking Brilinta with food or caffeine.  Past Medical History:  Diagnosis Date  . Allergic rhinitis   . Arthritis    "maybe a little in my knees and back" (07/17/2018)  . Chronic lower back pain   . Colitis 12/2013   "never had this before"  . COPD (chronic obstructive pulmonary disease) ([place])    "questionable/Dr. Tamala Julian" (07/17/2018)  . Exertional shortness of breath    "off and on" (07/17/2018)  . Family history of anesthesia complication    "mother; think they gave her too much; c/o pain after OR; gave her more  RX; they had to bring her back real quick"  . H/O calcium pyrophosphate deposition disease (CPPD)   . Heart murmur   . Hiatal hernia   . Hyperlipidemia   . Hypertension   . Numbness in feet   . On home oxygen therapy    "I've used it 2 times in 2 yrs" (07/17/2018)  . PAD (peripheral artery disease) ([place])   . Pneumonia    "1st grade & 4th grade" (07/17/2018)  . Proteinuria   . Shingles   . Stenosis of tear duct     Past Surgical History:  Procedure Laterality Date  . ABDOMINAL HYSTERECTOMY  1960's  . APPENDECTOMY    . BACK SURGERY    . BLEPHAROPLASTY Bilateral 05/2017  . CATARACT EXTRACTION, BILATERAL Bilateral   . CESAREAN SECTION  1957; 1960  . CORONARY STENT INTERVENTION N/A 07/17/2018   Procedure: CORONARY STENT INTERVENTION;  Surgeon: Belva Crome, MD;  Location: [facility];  Service: Cardiovascular;  Laterality: N/A;  . DACROCYSTORHINOSTOMY W/ JONES TUBE Bilateral 05/2017   "put tear duct in my eyes"  . EYE SURGERY    . LAPAROSCOPIC CHOLECYSTECTOMY  ~ 2009  . LUMBAR DISC SURGERY  ?2004  . RIGHT/LEFT HEART CATH AND CORONARY ANGIOGRAPHY N/A 07/17/2018   Procedure: RIGHT/LEFT HEART CATH AND CORONARY ANGIOGRAPHY;  Surgeon: Belva Crome, MD;  Location: [facility];  Service: Cardiovascular;  Laterality: N/A;  . TONSILLECTOMY  AND ADENOIDECTOMY  ?1940's    Current Medications: Current Meds  Medication Sig  . acetaminophen (TYLENOL) 500 MG tablet Take 500 mg by mouth 2 (two) times daily as needed for moderate pain or headache.   . albuterol (PROVENTIL) (2.5 MG/3ML) 0.083% nebulizer solution Take 3 mLs (2.5 mg total) by nebulization every 4 (four) hours as needed for wheezing or shortness of breath.  [name] aspirin 81 MG chewable tablet Chew 1 tablet (81 mg total) by mouth daily.  . budesonide-formoterol (SYMBICORT) 160-4.5 MCG/ACT inhaler Inhale 2 puffs into the lungs 2 (two) times daily.  . cholecalciferol (VITAMIN D) 1000 units tablet Take 1,000 Units by mouth at  bedtime.  . Denosumab (PROLIA [place]) Inject 1 Dose as directed every 6 (six) months. Use as directed.   . diphenhydramine-acetaminophen (TYLENOL PM) 25-500 MG TABS Take 1 tablet by mouth at bedtime as needed (pain/sleep).   . Evolocumab (REPATHA) 140 MG/ML SOSY Inject 140 mg into the skin every 14 (fourteen) days.  [name] lisinopril-hydrochlorothiazide (PRINZIDE,ZESTORETIC) 20-25 MG tablet Take 1 tablet by mouth at bedtime.   . metoprolol tartrate (LOPRESSOR) 25 MG tablet Take 0.5 tablets (12.5 mg total) by mouth 2 (two) times daily. Please keep upcoming appt with Dr. Tamala Julian in June for future refills.Thank you  . nitroGLYCERIN (NITROSTAT) 0.4 MG SL tablet Place 1 tablet (0.4 mg total) under the tongue every 5 (five) minutes as needed for chest pain.  . [DISCONTINUED] ticagrelor (BRILINTA) 90 MG TABS tablet Take 1 tablet (90 mg total) by mouth 2 (two) times daily.     Allergies:   Codeine; Crestor [rosuvastatin]; Darvon [propoxyphene]; Lipitor [atorvastatin]; and Zocor [simvastatin]   Social History   Socioeconomic History  . Marital status: Married    Spouse name: Not on file  . Number of children: Not on file  . Years of education: Not on file  . Highest education level: Not on file  Occupational History  . Not on file  Social Needs  . Financial resource strain: Not on file  . Food insecurity:    Worry: Not on file    Inability: Not on file  . Transportation needs:    Medical: Not on file    Non-medical: Not on file  Tobacco Use  . Smoking status: Never Smoker  . Smokeless tobacco: Never Used  Substance and Sexual Activity  . Alcohol use: Yes    Alcohol/week: 4.0 standard drinks    Types: 4 Glasses of wine per week    Comment: glass of wine every other night  . Drug use: Never  . Sexual activity: Not Currently  Lifestyle  . Physical activity:    Days per week: Not on file    Minutes per session: Not on file  . Stress: Not on file  Relationships  . Social connections:    Talks  on phone: Not on file    Gets together: Not on file    Attends religious service: Not on file    Active member of club or organization: Not on file    Attends meetings of clubs or organizations: Not on file    Relationship status: Not on file  Other Topics Concern  . Not on file  Social History Narrative  . Not on file     Family History:  The patient's family history includes CVA in her mother; Cancer in her brother; Emphysema (age of onset: 58) in her father.   ROS:   Please see the history of present illness.  Review of Systems  Constitution: Negative.  HENT: Negative.   Eyes: Negative.   Cardiovascular: Negative.   Respiratory: Positive for shortness of breath.   Hematologic/Lymphatic: Negative.   Musculoskeletal: Positive for muscle weakness and myalgias. Negative for joint pain.  Gastrointestinal: Negative.   Genitourinary: Negative.   Neurological: Negative.    All other systems reviewed and are negative.   PHYSICAL EXAM:   VS:  BP 126/68   Pulse (!) 53   Ht _0  (1.499 m)   Wt 115 lb 12.8 oz (52.5 kg)   BMI 23.39 kg/m   Physical Exam  GEN: Well nourished, well developed, younger than stated age in no acute distress  Neck: no JVD, carotid bruits, or masses Cardiac:RRR; 1/6 systolic murmur at the left sternal border Respiratory:  clear to auscultation bilaterally, normal work of breathing GI: soft, nontender, nondistended, + BS Ext: Right arm without hematoma or hemorrhage at cath site, good radial brachial pulses, lower extremities without cyanosis, clubbing, or edema, Good distal pulses bilaterally Neuro:  Alert and Oriented x 3 Psych: euthymic mood, full affect  Wt Readings from Last 3 Encounters:  08/08/18 115 lb 12.8 oz (52.5 kg)  07/18/18 114 lb 10.2 oz (52 kg)  07/05/18 120 lb (54.4 kg)      Studies/Labs Reviewed:   EKG:  EKG is  ordered today.  The ekg ordered today demonstrates sinus bradycardia at 53 bpm nonspecific ST-T wave  changes  Recent Labs: 05/29/2018: NT-Pro BNP 495 07/18/2018: BUN 14; Creatinine, Ser 0.86; Hemoglobin 10.2; Platelets 188; Potassium 3.9; Sodium 139   Lipid Panel No results found for: CHOL, TRIG, HDL, CHOLHDL, VLDL, LDLCALC, LDLDIRECT  Additional studies/ records that were reviewed today include:   Right and left cardiac catheterization 07/2018  Coronary artery disease with high-grade obstruction in the mid LAD and proximal to mid first diagonal.  Normal left main.  Normal circumflex coronary artery.  Dominant RCA with luminal irregularities up to 40% distally.  Right heart pressures  Ventricular systolic function and LVEDP (14 mmHg).  Successful PCI and stent implantation in both the mid LAD and proximal to mid first diagonal.    The LAD 90% stenosis was reduced to 0% with TIMI grade III flow using a 2.5 x 28 Synergy postdilated to 2.75 mm in diameter.  TIMI grade III flow pre-and post stenting.  The first diagonal 90% stenosis was reduced to 0% using by 2.25 mm Synergy deployed at 14 atm which is not effective stent size of 2.32 mm.  TIMI grade III flow noted pre-and post PCI.   RECOMMENDATIONS:    Continue aggressive risk factor modification: PCSK9 therapy (Repatha), moderate aerobic physical activity, blood pressure control less than 140/90 mmHg, and surveillance of glycemic control.  Aspirin and Brilinta x1 month.  Switch to aspirin and Plavix thereafter.  Total duration of dual antiplatelet therapy 6 months at which time Plavix can be dropped.  Discharge in a.m.   Recommend uninterrupted dual antiplatelet therapy with Aspirin 69m daily and Ticagrelor 927mtwice daily for a minimum of 6 months (stable ischemic heart disease - Class I recommendation).   Left Ventricle The left ventricular size is normal. The left ventricular systolic function is normal. LV end diastolic pressure is normal. The left ventricular ejection fraction is greater than 65% by visual estimate. No  regional wall motion abnormalities.  Aortic Valve The aortic valve is calcified.  Aorta Aortic Root: "Porcelain aorta". Heavy calcification is noted within the sinuses of Valsalva and ascending aorta  Right Heart Pressures Hemodynamic findings consistent with pulmonary hypertension. LV EDP is normal.   2D echo 04/2017 Study Conclusions   - Left ventricle: The cavity size was normal. Wall thickness was   increased in a pattern of mild LVH. Systolic function was   vigorous. The estimated ejection fraction was in the range of 65%   to 70%. Wall motion was normal; there were no regional wall   motion abnormalities. Features are consistent with a pseudonormal   left ventricular filling pattern, with concomitant abnormal   relaxation and increased filling pressure (grade 2 diastolic   dysfunction). - Left atrium: The atrium was moderately dilated. - Tricuspid valve: There was moderate regurgitation. - Pulmonary arteries: Systolic pressure was moderately increased.   PA peak pressure: 41 mm Hg (S).      ASSESSMENT:    1. Coronary artery disease involving native coronary artery of native heart without angina pectoris   2. Aortic stenosis, supravalvular   3. Essential hypertension   4. PAD (peripheral artery disease) ([place])   5. Dyslipidemia   6. Chronic diastolic CHF (congestive heart failure) ([facility])      PLAN:  In order of problems listed above:  CAD status post DES to the LAD and first diagonal 03/2352 complicated by hematoma and hypotension.  Plan is for aspirin and Brilinta x1 month then transition to aspirin and Plavix for a minimum of 6 months.  Will check with Dr. Tamala Julian whether or not she needs a Plavix load.  Patient is having shortness of breath since she has been on the Brilinta.  I have asked her to take it with food and caffeine to see if this helps.  No heart failure on exam.  She will only be on it for 10 more days.  Follow-up with Dr. Tamala Julian in 2 months.  Aortic stenosis  with porcelain valve on  heart cath  Chronic diastolic CHF with pulmonary hypertension and right heart cath 07/17/2018 LVEF 65% on lisinopril HCTZ-no evidence of CHF on exam.  PAD followed by Dr. Wyatt Portela stopped because of DAPT  Dyslipidemia on Repatha managed by PCP.  She will call for refills as she gets her last dose this week.  Medication Adjustments/Labs and Tests Ordered: Current medicines are reviewed at length with the patient today.  Concerns regarding medicines are outlined above.  Medication changes, Labs and Tests ordered today are listed in the Patient Instructions below. Patient Instructions  Medication Instructions:  Your physician has recommended you make the following change in your medication:   1. On 08/18/18:   -STOP:    Labwork: None ordered  Testing/Procedures: None ordered  Follow-Up: Your physician recommends that you schedule a follow-up appointment in: 2 months with Dr. Tamala Julian. \   Any Other Special Instructions Will Be Listed Below (If Applicable).     If you need a refill on your cardiac medications before your next appointment, please call your pharmacy.      Sumner Boast, PA-C  08/08/2018 9:28 AM    [facility] [place], [place], [place]  [address] Phone: [phone]; Fax: [phone]

## 2018-08-04 ENCOUNTER — Encounter: Payer: Self-pay | Admitting: Physician Assistant

## 2018-08-08 ENCOUNTER — Ambulatory Visit (INDEPENDENT_AMBULATORY_CARE_PROVIDER_SITE_OTHER): Payer: Medicare Other | Admitting: Physician Assistant

## 2018-08-08 ENCOUNTER — Encounter: Payer: Self-pay | Admitting: Physician Assistant

## 2018-08-08 VITALS — BP 126/68 | HR 53 | Ht 59.0 in | Wt 115.8 lb

## 2018-08-08 DIAGNOSIS — I5032 Chronic diastolic (congestive) heart failure: Secondary | ICD-10-CM

## 2018-08-08 DIAGNOSIS — E785 Hyperlipidemia, unspecified: Secondary | ICD-10-CM

## 2018-08-08 DIAGNOSIS — I739 Peripheral vascular disease, unspecified: Secondary | ICD-10-CM

## 2018-08-08 DIAGNOSIS — I10 Essential (primary) hypertension: Secondary | ICD-10-CM

## 2018-08-08 DIAGNOSIS — Q253 Supravalvular aortic stenosis: Secondary | ICD-10-CM

## 2018-08-08 DIAGNOSIS — I2510 Atherosclerotic heart disease of native coronary artery without angina pectoris: Secondary | ICD-10-CM

## 2018-08-08 MED ORDER — CLOPIDOGREL BISULFATE 75 MG PO TABS: 75.0000 mg | ORAL_TABLET | Freq: Every day | ORAL | 3 refills | Status: DC

## 2018-08-08 NOTE — Patient Instructions (Addendum)
Medication Instructions:  Your physician has recommended you make the following change in your medication:   1. On 08/18/18:   -STOP: ticagrelor (Brilinta)  -START: clopidogrel (plavix) 75 mg tablet: Take 1 tablet by mouth once a day  (we will call you if Dr. Katrinka Blazing wants you to load your plavix)  You should take your brilinta with food and caffeine to help with your shortness of breath  Labwork: None ordered  Testing/Procedures: None ordered  Follow-Up: Your physician recommends that you schedule a follow-up appointment in: 2 months with Dr. Katrinka Blazing.    Any Other Special Instructions Will Be Listed Below (If Applicable).     If you need a refill on your cardiac medications before your next appointment, please call your pharmacy.

## 2018-08-10 ENCOUNTER — Telehealth: Payer: Self-pay

## 2018-08-10 NOTE — Telephone Encounter (Signed)
Called patient and gave her instructions for transitioning her Brilinta to Plavix. Patient understands to:  9/13- Take 1 of the 90 mg tablets of the Brilinta in the morning only (patient will not take anymore brilinta after morning dose)  9/14- Take 4 of the 75 mg tablets of plavix (300 mg total) once  9/15- Take 1 of the 75 mg tablets of plavix once daily and everyday thereafter   Patient understands to continue taking ASA 81 mg QD and to take her medicine with food. Instructed patient to call back with any questions. Patient verbalized understanding and thanked me for the call.

## 2018-08-10 NOTE — Telephone Encounter (Signed)
-----   Message from Dyann Kief, PA-C sent at 08/10/2018  8:01 AM EDT ----- Lowanda Foster, Can you let this patient know that Dr. Katrinka Blazing wants her to take her last dose of Brillinta the am of 08/18/18. Don't take the evening dose. On 08/19/18 She should take Plavix 300mg (Plavix load) then on 9/15 start Plavix 75 mg daily. Continue ASA 81 mg through all of this. Make sure she takes these meds with food. Thanks, Jacolyn Reedy ----- Message ----- From: Lyn Records, MD Sent: 08/09/2018  11:26 PM EDT To: Dyann Kief, PA-C  Load with 300 mg then 75 mg daily. ----- Message ----- From: Dyann Kief, PA-C Sent: 08/08/2018   9:31 AM EDT To: Lyn Records, MD  Dr. Katrinka Blazing, do you want this patient to have a Plavix load when transitioning her from Brilinta to Plavix?  If so we do want 300 mg?  Thanks [employer]

## 2018-08-18 ENCOUNTER — Other Ambulatory Visit: Payer: Self-pay | Admitting: Interventional Cardiology

## 2018-08-23 ENCOUNTER — Telehealth (HOSPITAL_COMMUNITY): Payer: Self-pay

## 2018-08-23 NOTE — Telephone Encounter (Signed)
Cardiac Rehab Medication Review by a Pharmacist  Does the patient feel that his/her medications are working for him/her?  Yes - she did state that she hoped Plavix would help her to breathe a bit better. I asked her if she knew what she was taking this medication for and she appropriately stated that it is for her stents. Educated her that this medication may not help her shortness of breath too much. She reports that she stopped taking Symbicort about 5 days ago because she ran out and it is too expensive for her to refill (~$300). She said while she was on Symbicort it sometimes helped with her breathing. She said she was getting samples from her doctor but they ran out of samples. I encouraged her to speak with the prescribing doctor about finding a cheaper alternative.  Has the patient been experiencing any side effects to the medications prescribed?  no  Does the patient measure his/her own blood pressure or blood glucose at home?  no   Does the patient have any problems obtaining medications due to transportation or finances?   No except her Symbicort is too expensive for her (~$300).  Understanding of regimen: good Understanding of indications: good Potential of compliance: good    Pharmacist comments: N/A   Sydney MarketMelissa Gross, PharmD PGY1 Pharmacy Resident Phone [phone] 08/23/2018     4:31 PM

## 2018-08-25 NOTE — Progress Notes (Signed)
Sydney Gross 84 y.o. female DOB 1930-01-29 MRN [id number]       Nutrition  No diagnosis found. Past Medical History:  Diagnosis Date  . Allergic rhinitis   . Arthritis    "maybe a little in my knees and back" (07/17/2018)  . Chronic lower back pain   . Colitis 12/2013   "never had this before"  . COPD (chronic obstructive pulmonary disease) ([facility])    "questionable/Dr. Katrinka BlazingSmith" (07/17/2018)  . Exertional shortness of breath    "off and on" (07/17/2018)  . Family history of anesthesia complication    "mother; think they gave her too much; c/o pain after OR; gave her more RX; they had to bring her back real quick"  . H/O calcium pyrophosphate deposition disease (CPPD)   . Heart murmur   . Hiatal hernia   . Hyperlipidemia   . Hypertension   . Numbness in feet   . On home oxygen therapy    "I've used it 2 times in 2 yrs" (07/17/2018)  . PAD (peripheral artery disease) ([facility])   . Pneumonia    "1st grade & 4th grade" (07/17/2018)  . Proteinuria   . Shingles   . Stenosis of tear duct    Meds reviewed.    Current Outpatient Medications (Endocrine & Metabolic):  .  Denosumab (PROLIA [place]), Inject 1 Dose as directed every 6 (six) months. Use as directed.   Current Outpatient Medications (Cardiovascular):  [name].  Evolocumab (REPATHA) 140 MG/ML SOSY, Inject 140 mg into the skin every 14 (fourteen) days. [name].  lisinopril-hydrochlorothiazide (PRINZIDE,ZESTORETIC) 20-25 MG tablet, Take 1 tablet by mouth at bedtime.  .  metoprolol tartrate (LOPRESSOR) 25 MG tablet, Take 0.5 tablets (12.5 mg total) by mouth 2 (two) times daily. .  nitroGLYCERIN (NITROSTAT) 0.4 MG SL tablet, Place 1 tablet (0.4 mg total) under the tongue every 5 (five) minutes as needed for chest pain.  Current Outpatient Medications (Respiratory):  .  albuterol (PROVENTIL) (2.5 MG/3ML) 0.083% nebulizer solution, Take 3 mLs (2.5 mg total) by nebulization every 4 (four) hours as needed for wheezing or shortness of breath. .   budesonide-formoterol (SYMBICORT) 160-4.5 MCG/ACT inhaler, Inhale 2 puffs into the lungs 2 (two) times daily.  Current Outpatient Medications (Analgesics):  .  acetaminophen (TYLENOL) 500 MG tablet, Take 500 mg by mouth 2 (two) times daily as needed for moderate pain or headache.  [name].  aspirin 81 MG chewable tablet, Chew 1 tablet (81 mg total) by mouth daily.  Current Outpatient Medications (Hematological):  .  clopidogrel (PLAVIX) 75 MG tablet, Take 1 tablet (75 mg total) by mouth daily.  Current Outpatient Medications (Other):  .  cholecalciferol (VITAMIN D) 1000 units tablet, Take 1,000 Units by mouth at bedtime. .  diphenhydramine-acetaminophen (TYLENOL PM) 25-500 MG TABS, Take 1 tablet by mouth at bedtime as needed (pain/sleep).    HT: Ht Readings from Last 1 Encounters:  08/08/18 4\' 11"  (1.499 m)    WT: Wt Readings from Last 5 Encounters:  08/08/18 115 lb 12.8 oz (52.5 kg)  07/18/18 114 lb 10.2 oz (52 kg)  07/05/18 120 lb (54.4 kg)  06/21/18 119 lb (54 kg)  06/20/18 118 lb (53.5 kg)     BMI 23.39  Current tobacco use? No       Labs:  Lipid Panel  No results found for: CHOL, TRIG, HDL, CHOLHDL, VLDL, LDLCALC, LDLDIRECT  No results found for: HGBA1C CBG (last 3)  No results for input(s): GLUCAP in the last 72 hours.  Nutrition Diagnosis ? Food-and nutrition-related knowledge deficit related to lack of exposure to information as related to diagnosis of: ? CVD ? CHF  Nutrition Goal(s):   ? Pt to identify and limit food sources of saturated fat, trans fat, and sodium ? Pt to describe the potential benefits of adopting Therapeutic Lifestyle Changes   Plan:  Pt to attend nutrition classes ? Nutrition I ? Nutrition II ? Portion Distortion  Will provide client-centered nutrition education as part of interdisciplinary care.   Monitor and evaluate progress toward nutrition goal with team.  Ross Marcus, MS, RD, LDN 08/25/2018 1:34 PM

## 2018-08-29 ENCOUNTER — Encounter (HOSPITAL_COMMUNITY)
Admission: RE | Admit: 2018-08-29 | Discharge: 2018-08-29 | Disposition: A | Discharge status: Home / Self Care | Payer: Medicare Other | Source: Ambulatory Visit | Attending: Interventional Cardiology | Admitting: Interventional Cardiology

## 2018-08-29 ENCOUNTER — Encounter (HOSPITAL_COMMUNITY): Payer: Self-pay

## 2018-08-29 VITALS — BP 112/79 | HR 53 | Ht 59.0 in | Wt 117.7 lb

## 2018-08-29 DIAGNOSIS — Z955 Presence of coronary angioplasty implant and graft: Secondary | ICD-10-CM

## 2018-08-29 HISTORY — DX: Atherosclerotic heart disease of native coronary artery without angina pectoris: I25.10

## 2018-08-29 NOTE — Progress Notes (Signed)
Sydney Gross 84 y.o. female DOB: 12-06-1930 MRN: [id number]      Nutrition Note  1. 07/17/18 DES x2 LAD & D1    Past Medical History:  Diagnosis Date  . Allergic rhinitis   . Arthritis    "maybe a little in my knees and back" (07/17/2018)  . Chronic lower back pain   . Colitis 12/2013   "never had this before"  . COPD (chronic obstructive pulmonary disease) (HCC)    "questionable/Dr. Katrinka BlazingSmith" (07/17/2018)  . Exertional shortness of breath    "off and on" (07/17/2018)  . Family history of anesthesia complication    "mother; think they gave her too much; c/o pain after OR; gave her more RX; they had to bring her back real quick"  . H/O calcium pyrophosphate deposition disease (CPPD)   . Heart murmur   . Hiatal hernia   . Hyperlipidemia   . Hypertension   . Numbness in feet   . On home oxygen therapy    "I've used it 2 times in 2 yrs" (07/17/2018)  . PAD (peripheral artery disease) (HCC)   . Pneumonia    "1st grade & 4th grade" (07/17/2018)  . Proteinuria   . Shingles   . Stenosis of tear duct    Meds reviewed.   Current Outpatient Medications (Endocrine & Metabolic):  .  Denosumab (PROLIA [place]), Inject 1 Dose as directed every 6 (six) months. Use as directed.   Current Outpatient Medications (Cardiovascular):  [name].  Evolocumab (REPATHA) 140 MG/ML SOSY, Inject 140 mg into the skin every 14 (fourteen) days. [name].  lisinopril-hydrochlorothiazide (PRINZIDE,ZESTORETIC) 20-25 MG tablet, Take 1 tablet by mouth at bedtime.  .  metoprolol tartrate (LOPRESSOR) 25 MG tablet, Take 0.5 tablets (12.5 mg total) by mouth 2 (two) times daily. .  nitroGLYCERIN (NITROSTAT) 0.4 MG SL tablet, Place 1 tablet (0.4 mg total) under the tongue every 5 (five) minutes as needed for chest pain.  Current Outpatient Medications (Respiratory):  .  albuterol (PROVENTIL) (2.5 MG/3ML) 0.083% nebulizer solution, Take 3 mLs (2.5 mg total) by nebulization every 4 (four) hours as needed for wheezing or shortness of  breath. .  budesonide-formoterol (SYMBICORT) 160-4.5 MCG/ACT inhaler, Inhale 2 puffs into the lungs 2 (two) times daily.  Current Outpatient Medications (Analgesics):  .  acetaminophen (TYLENOL) 500 MG tablet, Take 500 mg by mouth 2 (two) times daily as needed for moderate pain or headache.  [name].  aspirin 81 MG chewable tablet, Chew 1 tablet (81 mg total) by mouth daily.  Current Outpatient Medications (Hematological):  .  clopidogrel (PLAVIX) 75 MG tablet, Take 1 tablet (75 mg total) by mouth daily.  Current Outpatient Medications (Other):  .  cholecalciferol (VITAMIN D) 1000 units tablet, Take 1,000 Units by mouth at bedtime. .  diphenhydramine-acetaminophen (TYLENOL PM) 25-500 MG TABS, Take 1 tablet by mouth at bedtime as needed (pain/sleep).    HT: Ht Readings from Last 1 Encounters:  08/29/18 4' 11.25" (1.505 m)    WT: Wt Readings from Last 5 Encounters:  08/29/18 117 lb 11.6 oz (53.4 kg)  08/08/18 115 lb 12.8 oz (52.5 kg)  07/18/18 114 lb 10.2 oz (52 kg)  07/05/18 120 lb (54.4 kg)  06/21/18 119 lb (54 kg)     Body mass index is 23.58 kg/m.   Current tobacco use? No  Labs:  Lipid Panel  No results found for: CHOL, TRIG, HDL, CHOLHDL, VLDL, LDLCALC, LDLDIRECT  No results found for: HGBA1C CBG (last 3)  No results for  input(s): GLUCAP in the last 72 hours.  Nutrition Note Spoke with pt. Nutrition plan and goals reviewed with pt. Pt is following Step 2 of the Therapeutic Lifestyle Changes diet. Pt wants to maintain weight or gain ~5 lbs. Pt shared across the last year she has lost ~5 lbs, reports she has had a decreased appetite over the last few years. Pt reports that she eats ~2 meals a day. Wt maintenance/ gain tips reviewed (how to build a healthy plate, portion sizes, eating frequently across the day). Recommended pt increase number of meals and snacks daily, pt agreed to start eating breakfast, and to add in a snack daily. Per discussion, pt does not not use  canned/convenience foods often. Pt does not add salt to food. Pt does eat out frequently with her husband. Discussed limiting meals eaten away from home to help manage saturated fat, trans fat, and sodium. Pt expressed understanding of the information reviewed. Pt aware of nutrition education classes offered and would like to attend nutrition classes.  Nutrition Diagnosis ? Food-and nutrition-related knowledge deficit related to lack of exposure to information as related to diagnosis of: ? CVD    Nutrition Intervention ? Pt's individual nutrition plan and goals reviewed with pt. ? Pt given handouts for: ? Nutrition I class ? Nutrition II class   Nutrition Goal(s):   ? Pt to identify and limit food sources of saturated fat, trans fat, refined carbohydrates and sodium ? Pt to identify food quantities necessary to achieve weight maintenance or weight gain of 4-5 lbs lbs. at graduation from cardiac rehab.    Plan:  ? Pt to attend nutrition classes ? Nutrition I ? Nutrition II ? Portion Distortion  ? Diabetes Blitz ? Diabetes Q & Ae determined ? Will provide client-centered nutrition education as part of interdisciplinary care ? Monitor and evaluate progress toward nutrition goal with team.   Ross Marcus, MS, RD, LDN 08/29/2018 10:01 AM

## 2018-08-29 NOTE — Progress Notes (Signed)
Cardiac Individual Treatment Plan  Patient Details  Name: Sydney Gross MRN: [id number] Date of Birth: 30-May-1930 Referring Provider:     CARDIAC REHAB PHASE II ORIENTATION from 08/29/2018 in [facility] CARDIAC REHAB  Referring Provider  [name], MD      Initial Encounter Date:    CARDIAC REHAB PHASE II ORIENTATION from 08/29/2018 in [facility] CARDIAC REHAB  Date  08/29/18      Visit Diagnosis: 07/17/18 DES x2 LAD & D1  Patient's Home Medications on Admission:  Current Outpatient Medications:  .  acetaminophen (TYLENOL) 500 MG tablet, Take 500 mg by mouth 2 (two) times daily as needed for moderate pain or headache. , Disp: , Rfl:  .  albuterol (PROVENTIL) (2.5 MG/3ML) 0.083% nebulizer solution, Take 3 mLs (2.5 mg total) by nebulization every 4 (four) hours as needed for wheezing or shortness of breath., Disp: 75 mL, Rfl: 0 .  aspirin 81 MG chewable tablet, Chew 1 tablet (81 mg total) by mouth daily., Disp: 90 tablet, Rfl: 3 .  budesonide-formoterol (SYMBICORT) 160-4.5 MCG/ACT inhaler, Inhale 2 puffs into the lungs 2 (two) times daily., Disp: , Rfl:  .  cholecalciferol (VITAMIN D) 1000 units tablet, Take 1,000 Units by mouth at bedtime., Disp: , Rfl:  .  clopidogrel (PLAVIX) 75 MG tablet, Take 1 tablet (75 mg total) by mouth daily., Disp: 90 tablet, Rfl: 3 .  Denosumab (PROLIA [place]), Inject 1 Dose as directed every 6 (six) months. Use as directed. , Disp: , Rfl:  .  diphenhydramine-acetaminophen (TYLENOL PM) 25-500 MG TABS, Take 1 tablet by mouth at bedtime as needed (pain/sleep). , Disp: , Rfl:  .  Evolocumab (REPATHA) 140 MG/ML SOSY, Inject 140 mg into the skin every 14 (fourteen) days., Disp: , Rfl:  .  lisinopril-hydrochlorothiazide (PRINZIDE,ZESTORETIC) 20-25 MG tablet, Take 1 tablet by mouth at bedtime. , Disp: , Rfl:  .  metoprolol tartrate (LOPRESSOR) 25 MG tablet, Take 0.5 tablets (12.5 mg total) by mouth 2 (two) times daily., Disp: 90  tablet, Rfl: 3 .  nitroGLYCERIN (NITROSTAT) 0.4 MG SL tablet, Place 1 tablet (0.4 mg total) under the tongue every 5 (five) minutes as needed for chest pain., Disp: 25 tablet, Rfl: 3  Past Medical History: Past Medical History:  Diagnosis Date  . Allergic rhinitis   . Arthritis    "maybe a little in my knees and back" (07/17/2018)  . Chronic lower back pain   . Colitis 12/2013   "never had this before"  . COPD (chronic obstructive pulmonary disease) (HCC)    "questionable/Dr. Katrinka Blazing" (07/17/2018)  . Coronary artery disease   . Exertional shortness of breath    "off and on" (07/17/2018)  . Family history of anesthesia complication    "mother; think they gave her too much; c/o pain after OR; gave her more RX; they had to bring her back real quick"  . H/O calcium pyrophosphate deposition disease (CPPD)   . Heart murmur   . Hiatal hernia   . Hyperlipidemia   . Hypertension   . Numbness in feet   . On home oxygen therapy    "I've used it 2 times in 2 yrs" (07/17/2018)  . PAD (peripheral artery disease) (HCC)   . Pneumonia    "1st grade & 4th grade" (07/17/2018)  . Proteinuria   . Shingles   . Stenosis of tear duct     Tobacco Use: Social History   Tobacco Use  Smoking Status Never Smoker  Smokeless Tobacco Never Used    Labs: Recent Review Flowsheet Data    Labs for ITP Cardiac and Pulmonary Rehab Latest Ref Rng & Units 09/25/2008 01/19/2009 07/17/2018 07/17/2018 07/17/2018   PHART 7.350 - 7.450 - - - - 7.324(L)   PCO2ART 32.0 - 48.0 mmHg - - - - 42.1   HCO3 20.0 - 28.0 mmol/L - - 21.2 22.0 21.9   TCO2 22 - 32 mmol/L 25 27 22 23 23    ACIDBASEDEF 0.0 - 2.0 mmol/L - - 5.0(H) 5.0(H) 4.0(H)   O2SAT % - - 77.0 76.0 100.0      Capillary Blood Glucose: Lab Results  Component Value Date   GLUCAP 73 07/17/2018     Exercise Target Goals: Exercise Program Goal: Individual exercise prescription set using results from initial 6 min walk test and THRR while considering  patient's  activity barriers and safety.   Exercise Prescription Goal: Initial exercise prescription builds to 30-45 minutes a day of aerobic activity, 2-3 days per week.  Home exercise guidelines will be given to patient during program as part of exercise prescription that the participant will acknowledge.  Activity Barriers & Risk Stratification: Activity Barriers & Cardiac Risk Stratification - 08/29/18 0903      Activity Barriers & Cardiac Risk Stratification   Activity Barriers  Arthritis;Back Problems;Other (comment)    Comments  Claudication pain from PAD-bilateral. Prior back surgery-back pain.    Cardiac Risk Stratification  High       6 Minute Walk: 6 Minute Walk    Row Name 08/29/18 0856         6 Minute Walk   Phase  Initial     Distance  1000 feet     Walk Time  6 minutes     # of Rest Breaks  0     MPH  1.89     METS  1.25     RPE  15     Perceived Dyspnea   0     VO2 Peak  4.36     Symptoms  Yes (comment)     Comments  Patient c/o bilateral claudication pain, which she rated a "10/10" on the pain scale. Pt given cart at 3 minutes.     Resting HR  53 bpm     Resting BP  112/79     Resting Oxygen Saturation   98 %     Exercise Oxygen Saturation  during 6 min walk  99 %     Max Ex. HR  78 bpm     Max Ex. BP  120/80     2 Minute Post BP  104/62        Oxygen Initial Assessment:   Oxygen Re-Evaluation:   Oxygen Discharge (Final Oxygen Re-Evaluation):   Initial Exercise Prescription: Initial Exercise Prescription - 08/29/18 1200      Date of Initial Exercise RX and Referring Provider   Date  08/29/18    Referring Provider  [name], MD    Expected Discharge Date  12/04/18      Treadmill   MPH  1.5    Grade  0    Minutes  10    METs  2.15      Bike   Level  0.4    Minutes  10    METs  2.44      NuStep   Level  2    SPM  85    Minutes  10  METs  2.1      Prescription Details   Frequency (times per week)  3    Duration  Progress to 30  minutes of continuous aerobic without signs/symptoms of physical distress      Intensity   THRR 40-80% of Max Heartrate  54-107    Ratings of Perceived Exertion  11-13    Perceived Dyspnea  0-4      Progression   Progression  Continue to progress workloads to maintain intensity without signs/symptoms of physical distress.      Resistance Training   Training Prescription  Yes    Weight  2lbs    Reps  10-15       Perform Capillary Blood Glucose checks as needed.  Exercise Prescription Changes:   Exercise Comments:   Exercise Goals and Review:  Exercise Goals    Row Name 08/29/18 0903             Exercise Goals   Increase Physical Activity  Yes       Intervention  Provide advice, education, support and counseling about physical activity/exercise needs.;Develop an individualized exercise prescription for aerobic and resistive training based on initial evaluation findings, risk stratification, comorbidities and participant's personal goals.       Expected Outcomes  Short Term: Attend rehab on a regular basis to increase amount of physical activity.;Long Term: Exercising regularly at least 3-5 days a week.;Long Term: Add in home exercise to make exercise part of routine and to increase amount of physical activity.       Increase Strength and Stamina  Yes       Intervention  Provide advice, education, support and counseling about physical activity/exercise needs.;Develop an individualized exercise prescription for aerobic and resistive training based on initial evaluation findings, risk stratification, comorbidities and participant's personal goals.       Expected Outcomes  Short Term: Increase workloads from initial exercise prescription for resistance, speed, and METs.;Short Term: Perform resistance training exercises routinely during rehab and add in resistance training at home;Long Term: Improve cardiorespiratory fitness, muscular endurance and strength as measured by increased  METs and functional capacity ( )       Able to understand and use rate of perceived exertion (RPE) scale  Yes       Intervention  Provide education and explanation on how to use RPE scale       Expected Outcomes  Short Term: Able to use RPE daily in rehab to express subjective intensity level;Long Term:  Able to use RPE to guide intensity level when exercising independently       Knowledge and understanding of Target Heart Rate Range (THRR)  Yes       Intervention  Provide education and explanation of THRR including how the numbers were predicted and where they are located for reference       Expected Outcomes  Short Term: Able to state/look up THRR;Long Term: Able to use THRR to govern intensity when exercising independently;Short Term: Able to use daily as guideline for intensity in rehab       Able to check pulse independently  Yes       Intervention  Provide education and demonstration on how to check pulse in carotid and radial arteries.;Review the importance of being able to check your own pulse for safety during independent exercise       Expected Outcomes  Short Term: Able to explain why pulse checking is important during independent exercise;Long Term: Able to check pulse independently  and accurately       Understanding of Exercise Prescription  Yes       Intervention  Provide education, explanation, and written materials on patient's individual exercise prescription       Expected Outcomes  Short Term: Able to explain program exercise prescription;Long Term: Able to explain home exercise prescription to exercise independently       Improve claudication pain toleration; Improve walking ability  Yes       Intervention  Attend education sessions to aid in risk factor modification and understanding of disease process       Expected Outcomes  Short Term: Improve walking distance/time to onset of claudication pain;Long Term: Improve walking ability and toleration to claudication           Exercise Goals Re-Evaluation :   Discharge Exercise Prescription (Final Exercise Prescription Changes):   Nutrition:  Target Goals: Understanding of nutrition guidelines, daily intake of sodium 1500mg , cholesterol 200mg , calories 30% from fat and 7% or less from saturated fats, daily to have 5 or more servings of fruits and vegetables.  Biometrics: Pre Biometrics - 08/29/18 1249      Pre Biometrics   Height  4\' 11"  (1.499 m)    Weight  53.4 kg    Waist Circumference  31 inches    Hip Circumference  37.25 inches    Waist to Hip Ratio  0.83 %    BMI (Calculated)  23.77    Triceps Skinfold  23.5 mm    % Body Fat  36.5 %    Grip Strength  13.5 kg    Flexibility  11.25 in    Single Leg Stand  18.28 seconds        Nutrition Therapy Plan and Nutrition Goals: Nutrition Therapy & Goals - 08/29/18 1001      Nutrition Therapy   Diet  heart healthy      Personal Nutrition Goals   Nutrition Goal  Pt to identify food quantities necessary to achieve weight maintenance or weight gain of 4-5 lbs lbs. at graduation from cardiac rehab.     Personal Goal #2  Pt to identify and limit food sources of saturated fat, trans fat, refined carbohydrates and sodium      Intervention Plan   Intervention  Prescribe, educate and counsel regarding individualized specific dietary modifications aiming towards targeted core components such as weight, hypertension, lipid management, diabetes, heart failure and other comorbidities.    Expected Outcomes  Short Term Goal: Understand basic principles of dietary content, such as calories, fat, sodium, cholesterol and nutrients.;Long Term Goal: Adherence to prescribed nutrition plan.       Nutrition Assessments: Nutrition Assessments - 08/29/18 1002      MEDFICTS Scores   Pre Score  21       Nutrition Goals Re-Evaluation: Nutrition Goals Re-Evaluation    Row Name 08/29/18 1001             Goals   Current Weight  117 lb 11.6 oz (53.4 kg)           Nutrition Goals Re-Evaluation: Nutrition Goals Re-Evaluation    Row Name 08/29/18 1001             Goals   Current Weight  117 lb 11.6 oz (53.4 kg)          Nutrition Goals Discharge (Final Nutrition Goals Re-Evaluation): Nutrition Goals Re-Evaluation - 08/29/18 1001      Goals   Current Weight  117 lb 11.6  oz (53.4 kg)       Psychosocial: Target Goals: Acknowledge presence or absence of significant depression and/or stress, maximize coping skills, provide positive support system. Participant is able to verbalize types and ability to use techniques and skills needed for reducing stress and depression.  Initial Review & Psychosocial Screening: Initial Psych Review & Screening - 08/29/18 1026      Initial Review   Current issues with  None Identified      Family Dynamics   Good Support System?  Yes   husband and Dennard NipCheri, daughter      Barriers   Psychosocial barriers to participate in program  There are no identifiable barriers or psychosocial needs.      Screening Interventions   Interventions  Encouraged to exercise       Quality of Life Scores: Quality of Life - 08/29/18 1027      Quality of Life   Select  Quality of Life      Quality of Life Scores   Health/Function Pre  23.13 %    Socioeconomic Pre  27.75 %    Psych/Spiritual Pre  26.57 %    Family Pre  30 %    GLOBAL Pre  25.96 %      Scores of 19 and below usually indicate a poorer quality of life in these areas.  A difference of  2-3 points is a clinically meaningful difference.  A difference of 2-3 points in the total score of the Quality of Life Index has been associated with significant improvement in overall quality of life, self-image, physical symptoms, and general health in studies assessing change in quality of life.  PHQ-9: Recent Review Flowsheet Data    Depression screen [facility] 12/25/2013   Decreased Interest 0   Down, Depressed, Hopeless 0   PHQ - 2 Score 0     Interpretation  of Total Score  Total Score Depression Severity:  1-4 = Minimal depression, 5-9 = Mild depression, 10-14 = Moderate depression, 15-19 = Moderately severe depression, 20-27 = Severe depression   Psychosocial Evaluation and Intervention:   Psychosocial Re-Evaluation:   Psychosocial Discharge (Final Psychosocial Re-Evaluation):   Vocational Rehabilitation: Provide vocational rehab assistance to qualifying candidates.   Vocational Rehab Evaluation & Intervention: Vocational Rehab - 08/29/18 1027      Initial Vocational Rehab Evaluation & Intervention   Assessment shows need for Vocational Rehabilitation  No   retired       Education: Education Goals: Education classes will be provided on a weekly basis, covering required topics. Participant will state understanding/return demonstration of topics presented.  Learning Barriers/Preferences: Learning Barriers/Preferences - 08/29/18 1216      Learning Barriers/Preferences   Learning Barriers  Sight    Learning Preferences  Written Material;Video       Education Topics: Count Your Pulse:  -Group instruction provided by verbal instruction, demonstration, patient participation and written materials to support subject.  Instructors address importance of being able to find your pulse and how to count your pulse when at home without a heart monitor.  Patients get hands on experience counting their pulse with staff help and individually.   Heart Attack, Angina, and Risk Factor Modification:  -Group instruction provided by verbal instruction, video, and written materials to support subject.  Instructors address signs and symptoms of angina and heart attacks.    Also discuss risk factors for heart disease and how to make changes to improve heart health risk factors.   Functional Fitness:  -  Group instruction provided by verbal instruction, demonstration, patient participation, and written materials to support subject.  Instructors address  safety measures for doing things around the house.  Discuss how to get up and down off the floor, how to pick things up properly, how to safely get out of a chair without assistance, and balance training.   Meditation and Mindfulness:  -Group instruction provided by verbal instruction, patient participation, and written materials to support subject.  Instructor addresses importance of mindfulness and meditation practice to help reduce stress and improve awareness.  Instructor also leads participants through a meditation exercise.    Stretching for Flexibility and Mobility:  -Group instruction provided by verbal instruction, patient participation, and written materials to support subject.  Instructors lead participants through series of stretches that are designed to increase flexibility thus improving mobility.  These stretches are additional exercise for major muscle groups that are typically performed during regular warm up and cool down.   Hands Only CPR:  -Group verbal, video, and participation provides a basic overview of AHA guidelines for community CPR. Role-play of emergencies allow participants the opportunity to practice calling for help and chest compression technique with discussion of AED use.   Hypertension: -Group verbal and written instruction that provides a basic overview of hypertension including the most recent diagnostic guidelines, risk factor reduction with self-care instructions and medication management.    Nutrition I class: Heart Healthy Eating:  -Group instruction provided by PowerPoint slides, verbal discussion, and written materials to support subject matter. The instructor gives an explanation and review of the Therapeutic Lifestyle Changes diet recommendations, which includes a discussion on lipid goals, dietary fat, sodium, fiber, plant stanol/sterol esters, sugar, and the components of a well-balanced, healthy diet.   Nutrition II class: Lifestyle Skills:   -Group instruction provided by PowerPoint slides, verbal discussion, and written materials to support subject matter. The instructor gives an explanation and review of label reading, grocery shopping for heart health, heart healthy recipe modifications, and ways to make healthier choices when eating out.   Diabetes Question & Answer:  -Group instruction provided by PowerPoint slides, verbal discussion, and written materials to support subject matter. The instructor gives an explanation and review of diabetes co-morbidities, pre- and post-prandial blood glucose goals, pre-exercise blood glucose goals, signs, symptoms, and treatment of hypoglycemia and hyperglycemia, and foot care basics.   Diabetes Blitz:  -Group instruction provided by PowerPoint slides, verbal discussion, and written materials to support subject matter. The instructor gives an explanation and review of the physiology behind type 1 and type 2 diabetes, diabetes medications and rational behind using different medications, pre- and post-prandial blood glucose recommendations and Hemoglobin A1c goals, diabetes diet, and exercise including blood glucose guidelines for exercising safely.    Portion Distortion:  -Group instruction provided by PowerPoint slides, verbal discussion, written materials, and food models to support subject matter. The instructor gives an explanation of serving size versus portion size, changes in portions sizes over the last 20 years, and what consists of a serving from each food group.   Stress Management:  -Group instruction provided by verbal instruction, video, and written materials to support subject matter.  Instructors review role of stress in heart disease and how to cope with stress positively.     Exercising on Your Own:  -Group instruction provided by verbal instruction, power point, and written materials to support subject.  Instructors discuss benefits of exercise, components of exercise,  frequency and intensity of exercise, and end points for exercise.  Also discuss use of nitroglycerin and activating EMS.  Review options of places to exercise outside of rehab.  Review guidelines for sex with heart disease.   Cardiac Drugs I:  -Group instruction provided by verbal instruction and written materials to support subject.  Instructor reviews cardiac drug classes: antiplatelets, anticoagulants, beta blockers, and statins.  Instructor discusses reasons, side effects, and lifestyle considerations for each drug class.   Cardiac Drugs II:  -Group instruction provided by verbal instruction and written materials to support subject.  Instructor reviews cardiac drug classes: angiotensin converting enzyme inhibitors (ACE-I), angiotensin II receptor blockers (ARBs), nitrates, and calcium channel blockers.  Instructor discusses reasons, side effects, and lifestyle considerations for each drug class.   Anatomy and Physiology of the Circulatory System:  Group verbal and written instruction and models provide basic cardiac anatomy and physiology, with the coronary electrical and arterial systems. Review of: AMI, Angina, Valve disease, Heart Failure, Peripheral Artery Disease, Cardiac Arrhythmia, Pacemakers, and the ICD.   Other Education:  -Group or individual verbal, written, or video instructions that support the educational goals of the cardiac rehab program.   Holiday Eating Survival Tips:  -Group instruction provided by PowerPoint slides, verbal discussion, and written materials to support subject matter. The instructor gives patients tips, tricks, and techniques to help them not only survive but enjoy the holidays despite the onslaught of food that accompanies the holidays.   Knowledge Questionnaire Score: Knowledge Questionnaire Score - 08/29/18 1027      Knowledge Questionnaire Score   Pre Score  18/24       Core Components/Risk Factors/Patient Goals at Admission: Personal Goals  and Risk Factors at Admission - 08/29/18 1214      Core Components/Risk Factors/Patient Goals on Admission   Hypertension  Yes    Intervention  Provide education on lifestyle modifcations including regular physical activity/exercise, weight management, moderate sodium restriction and increased consumption of fresh fruit, vegetables, and low fat dairy, alcohol moderation, and smoking cessation.;Monitor prescription use compliance.    Expected Outcomes  Short Term: Continued assessment and intervention until BP is < 140/42mm HG in hypertensive participants. < 130/38mm HG in hypertensive participants with diabetes, heart failure or chronic kidney disease.;Long Term: Maintenance of blood pressure at goal levels.    Lipids  Yes    Intervention  Provide education and support for participant on nutrition & aerobic/resistive exercise along with prescribed medications to achieve LDL 70mg , HDL >40mg .    Expected Outcomes  Short Term: Participant states understanding of desired cholesterol values and is compliant with medications prescribed. Participant is following exercise prescription and nutrition guidelines.;Long Term: Cholesterol controlled with medications as prescribed, with individualized exercise RX and with personalized nutrition plan. Value goals: LDL < 70mg , HDL > 40 mg.       Core Components/Risk Factors/Patient Goals Review:    Core Components/Risk Factors/Patient Goals at Discharge (Final Review):    ITP Comments: ITP Comments    Row Name 08/29/18 0909           ITP Comments  Dr. Armanda Magic, Medical Director           Comments: Sydney Gross attended orientation from 0830 to 1009 to review rules and guidelines for program. Completed 6 minute walk test, Intitial ITP, and exercise prescription.  VSS. Telemetry-Sinus rhythm, Sinus brady with a tall t wave this has been previously documented. Sydney Gross's heart rate was noted at 48 briefly nonsustained. Sydney Gross's resting heart rate was noted in the  50's. Will fax exercise flow sheets  to Dr. Michaelle Copas  office for review with today's ECG's tracings. Sydney Gross did report experiencing claudication 3 minutes into her walk test. Sydney Gross was given a wheel chair to use for the remainder of her walk test. Sydney Gross's symptoms went away after rest.Maria Harlon Flor, RN,BSN 08/29/2018 12:56 PM

## 2018-09-01 ENCOUNTER — Telehealth: Payer: Self-pay | Admitting: Interventional Cardiology

## 2018-09-01 MED ORDER — METOPROLOL SUCCINATE ER 25 MG PO TB24: 12.5000 mg | ORAL_TABLET | Freq: Every day | ORAL | 3 refills | Status: DC

## 2018-09-01 NOTE — Telephone Encounter (Signed)
Tracings received from Cardiac Rehab.  Reviewed by Dr. Katrinka Blazing and he recommends d/c'ing Metoprolol Tartrate and starting Metoprolol Succinate 12.5mg  QD. Spoke with pt and went over recommendations per Dr. Katrinka Blazing. Pt verbalized understanding and was in agreement with this plan.  Medication sent to preferred pharmacy.  Pt appreciative for call.

## 2018-09-06 ENCOUNTER — Encounter (HOSPITAL_COMMUNITY): Payer: Medicare Other

## 2018-09-06 ENCOUNTER — Ambulatory Visit (HOSPITAL_COMMUNITY): Payer: Medicare Other

## 2018-09-06 ENCOUNTER — Encounter (HOSPITAL_COMMUNITY)
Admission: RE | Admit: 2018-09-06 | Discharge: 2018-09-06 | Disposition: A | Discharge status: Home / Self Care | Payer: Medicare Other | Source: Ambulatory Visit | Attending: Interventional Cardiology | Admitting: Interventional Cardiology

## 2018-09-06 DIAGNOSIS — I2510 Atherosclerotic heart disease of native coronary artery without angina pectoris: Secondary | ICD-10-CM | POA: Diagnosis not present

## 2018-09-06 DIAGNOSIS — I10 Essential (primary) hypertension: Secondary | ICD-10-CM | POA: Insufficient documentation

## 2018-09-06 DIAGNOSIS — J449 Chronic obstructive pulmonary disease, unspecified: Secondary | ICD-10-CM | POA: Insufficient documentation

## 2018-09-06 DIAGNOSIS — Z955 Presence of coronary angioplasty implant and graft: Secondary | ICD-10-CM | POA: Diagnosis not present

## 2018-09-06 NOTE — Progress Notes (Signed)
Cardiac Individual Treatment Plan  Patient Details  Name: Sydney Gross MRN: [id number] Date of Birth: 12/23/1929 Referring Provider:     CARDIAC REHAB PHASE II ORIENTATION from 08/29/2018 in [facility] CARDIAC REHAB  Referring Provider  [name], MD      Initial Encounter Date:    CARDIAC REHAB PHASE II ORIENTATION from 08/29/2018 in [facility] CARDIAC REHAB  Date  08/29/18      Visit Diagnosis: 07/17/18 DES x2 LAD & D1  Patient's Home Medications on Admission:  Current Outpatient Medications:  .  acetaminophen (TYLENOL) 500 MG tablet, Take 500 mg by mouth 2 (two) times daily as needed for moderate pain or headache. , Disp: , Rfl:  .  albuterol (PROVENTIL) (2.5 MG/3ML) 0.083% nebulizer solution, Take 3 mLs (2.5 mg total) by nebulization every 4 (four) hours as needed for wheezing or shortness of breath., Disp: 75 mL, Rfl: 0 .  aspirin 81 MG chewable tablet, Chew 1 tablet (81 mg total) by mouth daily., Disp: 90 tablet, Rfl: 3 .  budesonide-formoterol (SYMBICORT) 160-4.5 MCG/ACT inhaler, Inhale 2 puffs into the lungs 2 (two) times daily., Disp: , Rfl:  .  cholecalciferol (VITAMIN D) 1000 units tablet, Take 1,000 Units by mouth at bedtime., Disp: , Rfl:  .  clopidogrel (PLAVIX) 75 MG tablet, Take 1 tablet (75 mg total) by mouth daily., Disp: 90 tablet, Rfl: 3 .  Denosumab (PROLIA [place]), Inject 1 Dose as directed every 6 (six) months. Use as directed. , Disp: , Rfl:  .  diphenhydramine-acetaminophen (TYLENOL PM) 25-500 MG TABS, Take 1 tablet by mouth at bedtime as needed (pain/sleep). , Disp: , Rfl:  .  Evolocumab (REPATHA) 140 MG/ML SOSY, Inject 140 mg into the skin every 14 (fourteen) days., Disp: , Rfl:  .  lisinopril-hydrochlorothiazide (PRINZIDE,ZESTORETIC) 20-25 MG tablet, Take 1 tablet by mouth at bedtime. , Disp: , Rfl:  .  metoprolol succinate (TOPROL-XL) 25 MG 24 hr tablet, Take 0.5 tablets (12.5 mg total) by mouth daily., Disp: 45 tablet,  Rfl: 3 .  nitroGLYCERIN (NITROSTAT) 0.4 MG SL tablet, Place 1 tablet (0.4 mg total) under the tongue every 5 (five) minutes as needed for chest pain., Disp: 25 tablet, Rfl: 3  Past Medical History: Past Medical History:  Diagnosis Date  . Allergic rhinitis   . Arthritis    "maybe a little in my knees and back" (07/17/2018)  . Chronic lower back pain   . Colitis 12/2013   "never had this before"  . COPD (chronic obstructive pulmonary disease) (HCC)    "questionable/Dr. Katrinka Blazing" (07/17/2018)  . Coronary artery disease   . Exertional shortness of breath    "off and on" (07/17/2018)  . Family history of anesthesia complication    "mother; think they gave her too much; c/o pain after OR; gave her more RX; they had to bring her back real quick"  . H/O calcium pyrophosphate deposition disease (CPPD)   . Heart murmur   . Hiatal hernia   . Hyperlipidemia   . Hypertension   . Numbness in feet   . On home oxygen therapy    "I've used it 2 times in 2 yrs" (07/17/2018)  . PAD (peripheral artery disease) (HCC)   . Pneumonia    "1st grade & 4th grade" (07/17/2018)  . Proteinuria   . Shingles   . Stenosis of tear duct     Tobacco Use: Social History   Tobacco Use  Smoking Status Never Smoker  Smokeless  Tobacco Never Used    Labs: Recent Review Flowsheet Data    Labs for ITP Cardiac and Pulmonary Rehab Latest Ref Rng & Units 09/25/2008 01/19/2009 07/17/2018 07/17/2018 07/17/2018   PHART 7.350 - 7.450 - - - - 7.324(L)   PCO2ART 32.0 - 48.0 mmHg - - - - 42.1   HCO3 20.0 - 28.0 mmol/L - - 21.2 22.0 21.9   TCO2 22 - 32 mmol/L 25 27 22 23 23    ACIDBASEDEF 0.0 - 2.0 mmol/L - - 5.0(H) 5.0(H) 4.0(H)   O2SAT % - - 77.0 76.0 100.0      Capillary Blood Glucose: Lab Results  Component Value Date   GLUCAP 73 07/17/2018     Exercise Target Goals: Exercise Program Goal: Individual exercise prescription set using results from initial 6 min walk test and THRR while considering  patient's activity  barriers and safety.   Exercise Prescription Goal: Initial exercise prescription builds to 30-45 minutes a day of aerobic activity, 2-3 days per week.  Home exercise guidelines will be given to patient during program as part of exercise prescription that the participant will acknowledge.  Activity Barriers & Risk Stratification: Activity Barriers & Cardiac Risk Stratification - 08/29/18 0903      Activity Barriers & Cardiac Risk Stratification   Activity Barriers  Arthritis;Back Problems;Other (comment)    Comments  Claudication pain from PAD-bilateral. Prior back surgery-back pain.    Cardiac Risk Stratification  High       6 Minute Walk: 6 Minute Walk    Row Name 08/29/18 0856         6 Minute Walk   Phase  Initial     Distance  1000 feet     Walk Time  6 minutes     # of Rest Breaks  0     MPH  1.89     METS  1.25     RPE  15     Perceived Dyspnea   0     VO2 Peak  4.36     Symptoms  Yes (comment)     Comments  Patient c/o bilateral claudication pain, which she rated a "10/10" on the pain scale. Pt given cart at 3 minutes.     Resting HR  53 bpm     Resting BP  112/79     Resting Oxygen Saturation   98 %     Exercise Oxygen Saturation  during 6 min walk  99 %     Max Ex. HR  78 bpm     Max Ex. BP  120/80     2 Minute Post BP  104/62        Oxygen Initial Assessment:   Oxygen Re-Evaluation:   Oxygen Discharge (Final Oxygen Re-Evaluation):   Initial Exercise Prescription: Initial Exercise Prescription - 08/29/18 1200      Date of Initial Exercise RX and Referring Provider   Date  08/29/18    Referring Provider  [name], MD    Expected Discharge Date  12/04/18      Treadmill   MPH  1.5    Grade  0    Minutes  10    METs  2.15      Bike   Level  0.4    Minutes  10    METs  2.44      NuStep   Level  2    SPM  85    Minutes  10    METs  2.1      Prescription Details   Frequency (times per week)  3    Duration  Progress to 30 minutes of  continuous aerobic without signs/symptoms of physical distress      Intensity   THRR 40-80% of Max Heartrate  54-107    Ratings of Perceived Exertion  11-13    Perceived Dyspnea  0-4      Progression   Progression  Continue to progress workloads to maintain intensity without signs/symptoms of physical distress.      Resistance Training   Training Prescription  Yes    Weight  2lbs    Reps  10-15       Perform Capillary Blood Glucose checks as needed.  Exercise Prescription Changes:  Exercise Prescription Changes    Row Name 09/06/18 [phone]             Response to Exercise   Blood Pressure (Admit)  108/60       Blood Pressure (Exercise)  142/78       Blood Pressure (Exit)  114/72       Heart Rate (Admit)  74 bpm       Heart Rate (Exercise)  104 bpm       Heart Rate (Exit)  67 bpm       Rating of Perceived Exertion (Exercise)  13       Symptoms  claudication       Duration  Progress to 30 minutes of  aerobic without signs/symptoms of physical distress       Intensity  THRR unchanged         Progression   Progression  Continue to progress workloads to maintain intensity without signs/symptoms of physical distress.       Average METs  2.3         Resistance Training   Training Prescription  No Relaxation day, no weights         Interval Training   Interval Training  No         Treadmill   MPH  -       Grade  -       Minutes  -       METs  -         Bike   Level  0.4       Minutes  10       METs  2.41         NuStep   Level  2       SPM  85       Minutes  10       METs  2.4         Track   Laps  7       Minutes  10       METs  2.22          Exercise Comments:  Exercise Comments    Row Name 09/06/18 1100           Exercise Comments  Patient tolerated 1st session of exercise well with c/o claudication pain.          Exercise Goals and Review:  Exercise Goals    Row Name 08/29/18 0903             Exercise Goals   Increase Physical  Activity  Yes       Intervention  Provide advice, education, support and counseling about physical activity/exercise needs.;Develop an individualized exercise  prescription for aerobic and resistive training based on initial evaluation findings, risk stratification, comorbidities and participant's personal goals.       Expected Outcomes  Short Term: Attend rehab on a regular basis to increase amount of physical activity.;Long Term: Exercising regularly at least 3-5 days a week.;Long Term: Add in home exercise to make exercise part of routine and to increase amount of physical activity.       Increase Strength and Stamina  Yes       Intervention  Provide advice, education, support and counseling about physical activity/exercise needs.;Develop an individualized exercise prescription for aerobic and resistive training based on initial evaluation findings, risk stratification, comorbidities and participant's personal goals.       Expected Outcomes  Short Term: Increase workloads from initial exercise prescription for resistance, speed, and METs.;Short Term: Perform resistance training exercises routinely during rehab and add in resistance training at home;Long Term: Improve cardiorespiratory fitness, muscular endurance and strength as measured by increased METs and functional capacity ( )       Able to understand and use rate of perceived exertion (RPE) scale  Yes       Intervention  Provide education and explanation on how to use RPE scale       Expected Outcomes  Short Term: Able to use RPE daily in rehab to express subjective intensity level;Long Term:  Able to use RPE to guide intensity level when exercising independently       Knowledge and understanding of Target Heart Rate Range (THRR)  Yes       Intervention  Provide education and explanation of THRR including how the numbers were predicted and where they are located for reference       Expected Outcomes  Short Term: Able to state/look up THRR;Long  Term: Able to use THRR to govern intensity when exercising independently;Short Term: Able to use daily as guideline for intensity in rehab       Able to check pulse independently  Yes       Intervention  Provide education and demonstration on how to check pulse in carotid and radial arteries.;Review the importance of being able to check your own pulse for safety during independent exercise       Expected Outcomes  Short Term: Able to explain why pulse checking is important during independent exercise;Long Term: Able to check pulse independently and accurately       Understanding of Exercise Prescription  Yes       Intervention  Provide education, explanation, and written materials on patient's individual exercise prescription       Expected Outcomes  Short Term: Able to explain program exercise prescription;Long Term: Able to explain home exercise prescription to exercise independently       Improve claudication pain toleration; Improve walking ability  Yes       Intervention  Attend education sessions to aid in risk factor modification and understanding of disease process       Expected Outcomes  Short Term: Improve walking distance/time to onset of claudication pain;Long Term: Improve walking ability and toleration to claudication          Exercise Goals Re-Evaluation : Exercise Goals Re-Evaluation    Row Name 09/06/18 1100             Exercise Goal Re-Evaluation   Exercise Goals Review  Improve claudication pain tolerance and improve walking ability;Able to understand and use rate of perceived exertion (RPE) scale;Increase Physical Activity  Comments  Patient able to understand and use RPE scale appropriately. Pt c/o claudication pain during walking station.       Expected Outcomes  Increase workloads as tolerated to help improve cardiorespiratory fitness. Pt will have less pain with walking.          Discharge Exercise Prescription (Final Exercise Prescription Changes): Exercise  Prescription Changes - 09/06/18 0952      Response to Exercise   Blood Pressure (Admit)  108/60    Blood Pressure (Exercise)  142/78    Blood Pressure (Exit)  114/72    Heart Rate (Admit)  74 bpm    Heart Rate (Exercise)  104 bpm    Heart Rate (Exit)  67 bpm    Rating of Perceived Exertion (Exercise)  13    Symptoms  claudication    Duration  Progress to 30 minutes of  aerobic without signs/symptoms of physical distress    Intensity  THRR unchanged      Progression   Progression  Continue to progress workloads to maintain intensity without signs/symptoms of physical distress.    Average METs  2.3      Resistance Training   Training Prescription  No   Relaxation day, no weights     Interval Training   Interval Training  No      Treadmill   MPH  --    Grade  --    Minutes  --    METs  --      Bike   Level  0.4    Minutes  10    METs  2.41      NuStep   Level  2    SPM  85    Minutes  10    METs  2.4      Track   Laps  7    Minutes  10    METs  2.22       Nutrition:  Target Goals: Understanding of nutrition guidelines, daily intake of sodium 1500mg , cholesterol 200mg , calories 30% from fat and 7% or less from saturated fats, daily to have 5 or more servings of fruits and vegetables.  Biometrics: Pre Biometrics - 08/29/18 1249      Pre Biometrics   Height  4\' 11"  (1.499 m)    Weight  53.4 kg    Waist Circumference  31 inches    Hip Circumference  37.25 inches    Waist to Hip Ratio  0.83 %    BMI (Calculated)  23.77    Triceps Skinfold  23.5 mm    % Body Fat  36.5 %    Grip Strength  13.5 kg    Flexibility  11.25 in    Single Leg Stand  18.28 seconds        Nutrition Therapy Plan and Nutrition Goals: Nutrition Therapy & Goals - 08/29/18 1001      Nutrition Therapy   Diet  heart healthy      Personal Nutrition Goals   Nutrition Goal  Pt to identify food quantities necessary to achieve weight maintenance or weight gain of 4-5 lbs lbs. at  graduation from cardiac rehab.     Personal Goal #2  Pt to identify and limit food sources of saturated fat, trans fat, refined carbohydrates and sodium      Intervention Plan   Intervention  Prescribe, educate and counsel regarding individualized specific dietary modifications aiming towards targeted core components such as weight, hypertension, lipid management, diabetes, heart  failure and other comorbidities.    Expected Outcomes  Short Term Goal: Understand basic principles of dietary content, such as calories, fat, sodium, cholesterol and nutrients.;Long Term Goal: Adherence to prescribed nutrition plan.       Nutrition Assessments: Nutrition Assessments - 08/29/18 1002      MEDFICTS Scores   Pre Score  21       Nutrition Goals Re-Evaluation: Nutrition Goals Re-Evaluation    Row Name 08/29/18 1001             Goals   Current Weight  117 lb 11.6 oz (53.4 kg)          Nutrition Goals Re-Evaluation: Nutrition Goals Re-Evaluation    Row Name 08/29/18 1001             Goals   Current Weight  117 lb 11.6 oz (53.4 kg)          Nutrition Goals Discharge (Final Nutrition Goals Re-Evaluation): Nutrition Goals Re-Evaluation - 08/29/18 1001      Goals   Current Weight  117 lb 11.6 oz (53.4 kg)       Psychosocial: Target Goals: Acknowledge presence or absence of significant depression and/or stress, maximize coping skills, provide positive support system. Participant is able to verbalize types and ability to use techniques and skills needed for reducing stress and depression.  Initial Review & Psychosocial Screening: Initial Psych Review & Screening - 08/29/18 1026      Initial Review   Current issues with  None Identified      Family Dynamics   Good Support System?  Yes   husband and Dennard Nip, daughter      Barriers   Psychosocial barriers to participate in program  There are no identifiable barriers or psychosocial needs.      Screening Interventions    Interventions  Encouraged to exercise       Quality of Life Scores: Quality of Life - 08/29/18 1027      Quality of Life   Select  Quality of Life      Quality of Life Scores   Health/Function Pre  23.13 %    Socioeconomic Pre  27.75 %    Psych/Spiritual Pre  26.57 %    Family Pre  30 %    GLOBAL Pre  25.96 %      Scores of 19 and below usually indicate a poorer quality of life in these areas.  A difference of  2-3 points is a clinically meaningful difference.  A difference of 2-3 points in the total score of the Quality of Life Index has been associated with significant improvement in overall quality of life, self-image, physical symptoms, and general health in studies assessing change in quality of life.  PHQ-9: Recent Review Flowsheet Data    Depression screen [facility] 2/9 09/06/2018 12/25/2013   Decreased Interest 0 0   Down, Depressed, Hopeless 0 0   PHQ - 2 Score 0 0     Interpretation of Total Score  Total Score Depression Severity:  1-4 = Minimal depression, 5-9 = Mild depression, 10-14 = Moderate depression, 15-19 = Moderately severe depression, 20-27 = Severe depression   Psychosocial Evaluation and Intervention:   Psychosocial Re-Evaluation:   Psychosocial Discharge (Final Psychosocial Re-Evaluation):   Vocational Rehabilitation: Provide vocational rehab assistance to qualifying candidates.   Vocational Rehab Evaluation & Intervention: Vocational Rehab - 08/29/18 1027      Initial Vocational Rehab Evaluation & Intervention   Assessment shows need for Vocational  Rehabilitation  No   retired       Education: Education Goals: Education classes will be provided on a weekly basis, covering required topics. Participant will state understanding/return demonstration of topics presented.  Learning Barriers/Preferences: Learning Barriers/Preferences - 08/29/18 1216      Learning Barriers/Preferences   Learning Barriers  Sight    Learning Preferences  Written  Material;Video       Education Topics: Count Your Pulse:  -Group instruction provided by verbal instruction, demonstration, patient participation and written materials to support subject.  Instructors address importance of being able to find your pulse and how to count your pulse when at home without a heart monitor.  Patients get hands on experience counting their pulse with staff help and individually.   Heart Attack, Angina, and Risk Factor Modification:  -Group instruction provided by verbal instruction, video, and written materials to support subject.  Instructors address signs and symptoms of angina and heart attacks.    Also discuss risk factors for heart disease and how to make changes to improve heart health risk factors.   Functional Fitness:  -Group instruction provided by verbal instruction, demonstration, patient participation, and written materials to support subject.  Instructors address safety measures for doing things around the house.  Discuss how to get up and down off the floor, how to pick things up properly, how to safely get out of a chair without assistance, and balance training.   Meditation and Mindfulness:  -Group instruction provided by verbal instruction, patient participation, and written materials to support subject.  Instructor addresses importance of mindfulness and meditation practice to help reduce stress and improve awareness.  Instructor also leads participants through a meditation exercise.    Stretching for Flexibility and Mobility:  -Group instruction provided by verbal instruction, patient participation, and written materials to support subject.  Instructors lead participants through series of stretches that are designed to increase flexibility thus improving mobility.  These stretches are additional exercise for major muscle groups that are typically performed during regular warm up and cool down.   Hands Only CPR:  -Group verbal, video, and  participation provides a basic overview of AHA guidelines for community CPR. Role-play of emergencies allow participants the opportunity to practice calling for help and chest compression technique with discussion of AED use.   Hypertension: -Group verbal and written instruction that provides a basic overview of hypertension including the most recent diagnostic guidelines, risk factor reduction with self-care instructions and medication management.    Nutrition I class: Heart Healthy Eating:  -Group instruction provided by PowerPoint slides, verbal discussion, and written materials to support subject matter. The instructor gives an explanation and review of the Therapeutic Lifestyle Changes diet recommendations, which includes a discussion on lipid goals, dietary fat, sodium, fiber, plant stanol/sterol esters, sugar, and the components of a well-balanced, healthy diet.   Nutrition II class: Lifestyle Skills:  -Group instruction provided by PowerPoint slides, verbal discussion, and written materials to support subject matter. The instructor gives an explanation and review of label reading, grocery shopping for heart health, heart healthy recipe modifications, and ways to make healthier choices when eating out.   Diabetes Question & Answer:  -Group instruction provided by PowerPoint slides, verbal discussion, and written materials to support subject matter. The instructor gives an explanation and review of diabetes co-morbidities, pre- and post-prandial blood glucose goals, pre-exercise blood glucose goals, signs, symptoms, and treatment of hypoglycemia and hyperglycemia, and foot care basics.   Diabetes Blitz:  -Group instruction provided  by PowerPoint slides, verbal discussion, and written materials to support subject matter. The instructor gives an explanation and review of the physiology behind type 1 and type 2 diabetes, diabetes medications and rational behind using different medications,  pre- and post-prandial blood glucose recommendations and Hemoglobin A1c goals, diabetes diet, and exercise including blood glucose guidelines for exercising safely.    Portion Distortion:  -Group instruction provided by PowerPoint slides, verbal discussion, written materials, and food models to support subject matter. The instructor gives an explanation of serving size versus portion size, changes in portions sizes over the last 20 years, and what consists of a serving from each food group.   Stress Management:  -Group instruction provided by verbal instruction, video, and written materials to support subject matter.  Instructors review role of stress in heart disease and how to cope with stress positively.     Exercising on Your Own:  -Group instruction provided by verbal instruction, power point, and written materials to support subject.  Instructors discuss benefits of exercise, components of exercise, frequency and intensity of exercise, and end points for exercise.  Also discuss use of nitroglycerin and activating EMS.  Review options of places to exercise outside of rehab.  Review guidelines for sex with heart disease.   Cardiac Drugs I:  -Group instruction provided by verbal instruction and written materials to support subject.  Instructor reviews cardiac drug classes: antiplatelets, anticoagulants, beta blockers, and statins.  Instructor discusses reasons, side effects, and lifestyle considerations for each drug class.   Cardiac Drugs II:  -Group instruction provided by verbal instruction and written materials to support subject.  Instructor reviews cardiac drug classes: angiotensin converting enzyme inhibitors (ACE-I), angiotensin II receptor blockers (ARBs), nitrates, and calcium channel blockers.  Instructor discusses reasons, side effects, and lifestyle considerations for each drug class.   Anatomy and Physiology of the Circulatory System:  Group verbal and written instruction and  models provide basic cardiac anatomy and physiology, with the coronary electrical and arterial systems. Review of: AMI, Angina, Valve disease, Heart Failure, Peripheral Artery Disease, Cardiac Arrhythmia, Pacemakers, and the ICD.   Other Education:  -Group or individual verbal, written, or video instructions that support the educational goals of the cardiac rehab program.   Holiday Eating Survival Tips:  -Group instruction provided by PowerPoint slides, verbal discussion, and written materials to support subject matter. The instructor gives patients tips, tricks, and techniques to help them not only survive but enjoy the holidays despite the onslaught of food that accompanies the holidays.   Knowledge Questionnaire Score: Knowledge Questionnaire Score - 08/29/18 1027      Knowledge Questionnaire Score   Pre Score  18/24       Core Components/Risk Factors/Patient Goals at Admission: Personal Goals and Risk Factors at Admission - 08/29/18 1214      Core Components/Risk Factors/Patient Goals on Admission   Hypertension  Yes    Intervention  Provide education on lifestyle modifcations including regular physical activity/exercise, weight management, moderate sodium restriction and increased consumption of fresh fruit, vegetables, and low fat dairy, alcohol moderation, and smoking cessation.;Monitor prescription use compliance.    Expected Outcomes  Short Term: Continued assessment and intervention until BP is < 140/39mm HG in hypertensive participants. < 130/6mm HG in hypertensive participants with diabetes, heart failure or chronic kidney disease.;Long Term: Maintenance of blood pressure at goal levels.    Lipids  Yes    Intervention  Provide education and support for participant on nutrition & aerobic/resistive exercise along with prescribed  medications to achieve LDL 70mg , HDL >40mg .    Expected Outcomes  Short Term: Participant states understanding of desired cholesterol values and is  compliant with medications prescribed. Participant is following exercise prescription and nutrition guidelines.;Long Term: Cholesterol controlled with medications as prescribed, with individualized exercise RX and with personalized nutrition plan. Value goals: LDL < 70mg , HDL > 40 mg.       Core Components/Risk Factors/Patient Goals Review:  Goals and Risk Factor Review    Row Name 09/07/18 1054             Core Components/Risk Factors/Patient Goals Review   Personal Goals Review  Hypertension;Lipids       Review  09/06/18 was Pat's first days of exercise at cardiac rehab       Expected Outcomes  Patient will participate in phase 2 cardiac rehab. Follow exercise, nutrition and lifestyle modfication opportunities.          Core Components/Risk Factors/Patient Goals at Discharge (Final Review):  Goals and Risk Factor Review - 09/07/18 1054      Core Components/Risk Factors/Patient Goals Review   Personal Goals Review  Hypertension;Lipids    Review  09/06/18 was Pat's first days of exercise at cardiac rehab    Expected Outcomes  Patient will participate in phase 2 cardiac rehab. Follow exercise, nutrition and lifestyle modfication opportunities.       ITP Comments: ITP Comments    Row Name 08/29/18 0909 09/07/18 1056         ITP Comments  Dr. Armanda Magic, Medical Director   30 Day ITP review. 09/06/18 was Pat's first day of exercise         Comments: Dennie Bible started cardiac rehab today.  Pt tolerated light exercise without difficulty. VSS, telemetry-Sinus Rhythm, Pat did complain of claudication pain on the walking track and was encouraged to rest.  Medication list reconciled. Pt denies barriers to medicaiton compliance.  PSYCHOSOCIAL ASSESSMENT:  PHQ-0. Pt exhibits positive coping skills, hopeful outlook with supportive family. No psychosocial needs identified at this time, no psychosocial interventions necessary.    Pt enjoys shopping.   Pt oriented to exercise equipment and routine.     Understanding verbalized.Gladstone Lighter, RN,BSN 09/07/2018 10:59 AM

## 2018-09-08 ENCOUNTER — Encounter (HOSPITAL_COMMUNITY)
Admission: RE | Admit: 2018-09-08 | Discharge: 2018-09-08 | Disposition: A | Discharge status: Home / Self Care | Payer: Medicare Other | Source: Ambulatory Visit | Attending: Interventional Cardiology | Admitting: Interventional Cardiology

## 2018-09-08 ENCOUNTER — Encounter (HOSPITAL_COMMUNITY): Payer: Medicare Other

## 2018-09-08 ENCOUNTER — Ambulatory Visit (HOSPITAL_COMMUNITY): Payer: Medicare Other

## 2018-09-08 DIAGNOSIS — J449 Chronic obstructive pulmonary disease, unspecified: Secondary | ICD-10-CM | POA: Diagnosis not present

## 2018-09-08 DIAGNOSIS — Z955 Presence of coronary angioplasty implant and graft: Secondary | ICD-10-CM | POA: Diagnosis not present

## 2018-09-08 DIAGNOSIS — I10 Essential (primary) hypertension: Secondary | ICD-10-CM | POA: Diagnosis not present

## 2018-09-08 DIAGNOSIS — I2510 Atherosclerotic heart disease of native coronary artery without angina pectoris: Secondary | ICD-10-CM | POA: Diagnosis not present

## 2018-09-08 NOTE — Progress Notes (Signed)
Sydney Gross 84 y.o. female DOB: August 22, 1930 MRN: [id number]      Nutrition Note  1. 07/17/18 DES x2 LAD & D1    Past Medical History:  Diagnosis Date  . Allergic rhinitis   . Arthritis    "maybe a little in my knees and back" (07/17/2018)  . Chronic lower back pain   . Colitis 12/2013   "never had this before"  . COPD (chronic obstructive pulmonary disease) (HCC)    "questionable/Dr. Katrinka Blazing" (07/17/2018)  . Coronary artery disease   . Exertional shortness of breath    "off and on" (07/17/2018)  . Family history of anesthesia complication    "mother; think they gave her too much; c/o pain after OR; gave her more RX; they had to bring her back real quick"  . H/O calcium pyrophosphate deposition disease (CPPD)   . Heart murmur   . Hiatal hernia   . Hyperlipidemia   . Hypertension   . Numbness in feet   . On home oxygen therapy    "I've used it 2 times in 2 yrs" (07/17/2018)  . PAD (peripheral artery disease) (HCC)   . Pneumonia    "1st grade & 4th grade" (07/17/2018)  . Proteinuria   . Shingles   . Stenosis of tear duct    Meds reviewed.   Current Outpatient Medications (Endocrine & Metabolic):  .  Denosumab (PROLIA [place]), Inject 1 Dose as directed every 6 (six) months. Use as directed.   Current Outpatient Medications (Cardiovascular):  [name]  Evolocumab (REPATHA) 140 MG/ML SOSY, Inject 140 mg into the skin every 14 (fourteen) days. [name]  lisinopril-hydrochlorothiazide (PRINZIDE,ZESTORETIC) 20-25 MG tablet, Take 1 tablet by mouth at bedtime.  .  metoprolol succinate (TOPROL-XL) 25 MG 24 hr tablet, Take 0.5 tablets (12.5 mg total) by mouth daily. .  nitroGLYCERIN (NITROSTAT) 0.4 MG SL tablet, Place 1 tablet (0.4 mg total) under the tongue every 5 (five) minutes as needed for chest pain.  Current Outpatient Medications (Respiratory):  .  albuterol (PROVENTIL) (2.5 MG/3ML) 0.083% nebulizer solution, Take 3 mLs (2.5 mg total) by nebulization every 4 (four) hours as needed for wheezing or  shortness of breath. .  budesonide-formoterol (SYMBICORT) 160-4.5 MCG/ACT inhaler, Inhale 2 puffs into the lungs 2 (two) times daily.  Current Outpatient Medications (Analgesics):  .  acetaminophen (TYLENOL) 500 MG tablet, Take 500 mg by mouth 2 (two) times daily as needed for moderate pain or headache.  [name]  aspirin 81 MG chewable tablet, Chew 1 tablet (81 mg total) by mouth daily.  Current Outpatient Medications (Hematological):  .  clopidogrel (PLAVIX) 75 MG tablet, Take 1 tablet (75 mg total) by mouth daily.  Current Outpatient Medications (Other):  .  cholecalciferol (VITAMIN D) 1000 units tablet, Take 1,000 Units by mouth at bedtime. .  diphenhydramine-acetaminophen (TYLENOL PM) 25-500 MG TABS, Take 1 tablet by mouth at bedtime as needed (pain/sleep).    HT: Ht Readings from Last 1 Encounters:  08/29/18 4\' 11"  (1.499 m)    WT: Wt Readings from Last 5 Encounters:  08/29/18 117 lb 11.6 oz (53.4 kg)  08/08/18 115 lb 12.8 oz (52.5 kg)  07/18/18 114 lb 10.2 oz (52 kg)  07/05/18 120 lb (54.4 kg)  06/21/18 119 lb (54 kg)     There is no height or weight on file to calculate BMI.   Current tobacco use? No  Labs:  Lipid Panel  No results found for: CHOL, TRIG, HDL, CHOLHDL, VLDL, LDLCALC, LDLDIRECT  No results  found for: HGBA1C CBG (last 3)  No results for input(s): GLUCAP in the last 72 hours.  Nutrition Note Spoke with pt. Nutrition plan and goals reviewed with pt. Pt is following heart healthy diet. Pt wants to maintain weight or gain ~5 lbs. Pt noted again today that in the last year she has lost ~5 lbs. Attributes this to a decreased appetite. Pt reports that she has started to eat breakfast in addition to her regular ~2 meals a day. Reviewed wt maintenance/ gain tips discussed at orientation (how to build a healthy plate, portion sizes, eating frequently across the day). Recommended pt continue to consume 3 meals daily and when comfortable with that to add in a snack daily. Pt  expressed interest in some meal prep recipes and breakfast recipes. Distributed packet of recipes to patient and set goal to explore 1 new recipe every week. Pt expressed understanding of the information reviewed. Pt aware of nutrition education classes offered and would like to attend nutrition classes.  Nutrition Diagnosis  Food-and nutrition-related knowledge deficit related to lack of exposure to information as related to diagnosis of: ? CVD   Nutrition Intervention ? Pt's individual nutrition plan and goals reviewed with pt. ? Pt given handouts for: ? meal prep recipes and breakfast recipes  Nutrition Goal(s):   Pt to identify and limit food sources of saturated fat, trans fat, refined carbohydrates and sodium  Pt to identify food quantities necessary to achieve weight maintenance or weight gain of 4-5 lbs lbs. at graduation from cardiac rehab.    Plan:  ? Pt to attend nutrition classes ? Nutrition I ? Nutrition II ? Portion Distortion  ? Diabetes Blitz ? Diabetes Q & Ae determined ? Will provide client-centered nutrition education as part of interdisciplinary care ? Monitor and evaluate progress toward nutrition goal with team.   Ross Marcus, MS, RD, LDN 09/08/2018 11:55 AM

## 2018-09-11 ENCOUNTER — Encounter (HOSPITAL_COMMUNITY): Payer: Medicare Other

## 2018-09-11 ENCOUNTER — Ambulatory Visit (HOSPITAL_COMMUNITY): Payer: Medicare Other

## 2018-09-13 ENCOUNTER — Ambulatory Visit (HOSPITAL_COMMUNITY): Payer: Medicare Other

## 2018-09-13 ENCOUNTER — Encounter (HOSPITAL_COMMUNITY)
Admission: RE | Admit: 2018-09-13 | Discharge: 2018-09-13 | Disposition: A | Discharge status: Home / Self Care | Payer: Medicare Other | Source: Ambulatory Visit | Attending: Interventional Cardiology | Admitting: Interventional Cardiology

## 2018-09-13 ENCOUNTER — Encounter (HOSPITAL_COMMUNITY): Payer: Medicare Other

## 2018-09-13 DIAGNOSIS — I10 Essential (primary) hypertension: Secondary | ICD-10-CM | POA: Diagnosis not present

## 2018-09-13 DIAGNOSIS — I2510 Atherosclerotic heart disease of native coronary artery without angina pectoris: Secondary | ICD-10-CM | POA: Diagnosis not present

## 2018-09-13 DIAGNOSIS — J449 Chronic obstructive pulmonary disease, unspecified: Secondary | ICD-10-CM | POA: Diagnosis not present

## 2018-09-13 DIAGNOSIS — Z955 Presence of coronary angioplasty implant and graft: Secondary | ICD-10-CM | POA: Diagnosis not present

## 2018-09-15 ENCOUNTER — Encounter (HOSPITAL_COMMUNITY)
Admission: RE | Admit: 2018-09-15 | Discharge: 2018-09-15 | Disposition: A | Discharge status: Home / Self Care | Payer: Medicare Other | Source: Ambulatory Visit | Attending: Interventional Cardiology | Admitting: Interventional Cardiology

## 2018-09-15 ENCOUNTER — Encounter (HOSPITAL_COMMUNITY): Payer: Medicare Other

## 2018-09-15 ENCOUNTER — Ambulatory Visit (HOSPITAL_COMMUNITY): Payer: Medicare Other

## 2018-09-15 DIAGNOSIS — I2510 Atherosclerotic heart disease of native coronary artery without angina pectoris: Secondary | ICD-10-CM | POA: Diagnosis not present

## 2018-09-15 DIAGNOSIS — I10 Essential (primary) hypertension: Secondary | ICD-10-CM | POA: Diagnosis not present

## 2018-09-15 DIAGNOSIS — Z955 Presence of coronary angioplasty implant and graft: Secondary | ICD-10-CM | POA: Diagnosis not present

## 2018-09-15 DIAGNOSIS — J449 Chronic obstructive pulmonary disease, unspecified: Secondary | ICD-10-CM | POA: Diagnosis not present

## 2018-09-18 ENCOUNTER — Encounter (HOSPITAL_COMMUNITY): Payer: Medicare Other

## 2018-09-18 ENCOUNTER — Ambulatory Visit (HOSPITAL_COMMUNITY): Payer: Medicare Other

## 2018-09-18 ENCOUNTER — Encounter (HOSPITAL_COMMUNITY)
Admission: RE | Admit: 2018-09-18 | Discharge: 2018-09-18 | Disposition: A | Discharge status: Home / Self Care | Payer: Medicare Other | Source: Ambulatory Visit | Attending: Interventional Cardiology | Admitting: Interventional Cardiology

## 2018-09-18 DIAGNOSIS — Z955 Presence of coronary angioplasty implant and graft: Secondary | ICD-10-CM | POA: Diagnosis not present

## 2018-09-18 DIAGNOSIS — J449 Chronic obstructive pulmonary disease, unspecified: Secondary | ICD-10-CM | POA: Diagnosis not present

## 2018-09-18 DIAGNOSIS — I10 Essential (primary) hypertension: Secondary | ICD-10-CM | POA: Diagnosis not present

## 2018-09-18 DIAGNOSIS — I2510 Atherosclerotic heart disease of native coronary artery without angina pectoris: Secondary | ICD-10-CM | POA: Diagnosis not present

## 2018-09-18 NOTE — Progress Notes (Signed)
I have reviewed a Home Exercise Prescription with Theresia Lo . Sydney Gross is currently exercising at home.  The patient was advised to walk 5-7 days a week for 30 minutes.  Elease Hashimoto and I discussed how to progress their exercise prescription. The patient stated that they understand the exercise prescription.  We reviewed exercise guidelines, target heart rate during exercise, RPE Scale, weather conditions, NTG use, endpoints for exercise, warmup and cool down.  Patient is encouraged to come to me with any questions. I will continue to follow up with the patient to assist them with progression and safety.   09/18/2018  11:09 AM   Prentice Docker BS, ACSM CEP

## 2018-09-20 ENCOUNTER — Encounter (HOSPITAL_COMMUNITY): Payer: Medicare Other

## 2018-09-20 ENCOUNTER — Ambulatory Visit (HOSPITAL_COMMUNITY): Payer: Medicare Other

## 2018-09-20 ENCOUNTER — Encounter (HOSPITAL_COMMUNITY)
Admission: RE | Admit: 2018-09-20 | Discharge: 2018-09-20 | Disposition: A | Discharge status: Home / Self Care | Payer: Medicare Other | Source: Ambulatory Visit | Attending: Interventional Cardiology | Admitting: Interventional Cardiology

## 2018-09-20 DIAGNOSIS — I10 Essential (primary) hypertension: Secondary | ICD-10-CM | POA: Diagnosis not present

## 2018-09-20 DIAGNOSIS — Z955 Presence of coronary angioplasty implant and graft: Secondary | ICD-10-CM | POA: Diagnosis not present

## 2018-09-20 DIAGNOSIS — J449 Chronic obstructive pulmonary disease, unspecified: Secondary | ICD-10-CM | POA: Diagnosis not present

## 2018-09-20 DIAGNOSIS — I2510 Atherosclerotic heart disease of native coronary artery without angina pectoris: Secondary | ICD-10-CM | POA: Diagnosis not present

## 2018-09-22 ENCOUNTER — Ambulatory Visit (HOSPITAL_COMMUNITY): Payer: Medicare Other

## 2018-09-22 ENCOUNTER — Encounter (HOSPITAL_COMMUNITY): Payer: Medicare Other

## 2018-09-22 ENCOUNTER — Encounter (HOSPITAL_COMMUNITY)
Admission: RE | Admit: 2018-09-22 | Discharge: 2018-09-22 | Disposition: A | Discharge status: Home / Self Care | Payer: Medicare Other | Source: Ambulatory Visit | Attending: Interventional Cardiology | Admitting: Interventional Cardiology

## 2018-09-22 DIAGNOSIS — Z955 Presence of coronary angioplasty implant and graft: Secondary | ICD-10-CM

## 2018-09-22 DIAGNOSIS — J449 Chronic obstructive pulmonary disease, unspecified: Secondary | ICD-10-CM | POA: Diagnosis not present

## 2018-09-22 DIAGNOSIS — I2510 Atherosclerotic heart disease of native coronary artery without angina pectoris: Secondary | ICD-10-CM | POA: Diagnosis not present

## 2018-09-22 DIAGNOSIS — I10 Essential (primary) hypertension: Secondary | ICD-10-CM | POA: Diagnosis not present

## 2018-09-25 ENCOUNTER — Encounter (HOSPITAL_COMMUNITY): Payer: Medicare Other

## 2018-09-25 ENCOUNTER — Ambulatory Visit (HOSPITAL_COMMUNITY): Payer: Medicare Other

## 2018-09-27 ENCOUNTER — Encounter (HOSPITAL_COMMUNITY): Payer: Medicare Other

## 2018-09-27 ENCOUNTER — Ambulatory Visit (HOSPITAL_COMMUNITY): Payer: Medicare Other

## 2018-09-29 ENCOUNTER — Ambulatory Visit (HOSPITAL_COMMUNITY): Payer: Medicare Other

## 2018-09-29 ENCOUNTER — Encounter (HOSPITAL_COMMUNITY)
Admission: RE | Admit: 2018-09-29 | Discharge: 2018-09-29 | Disposition: A | Discharge status: Home / Self Care | Payer: Medicare Other | Source: Ambulatory Visit | Attending: Interventional Cardiology | Admitting: Interventional Cardiology

## 2018-09-29 ENCOUNTER — Encounter (HOSPITAL_COMMUNITY): Payer: Medicare Other

## 2018-09-29 DIAGNOSIS — I10 Essential (primary) hypertension: Secondary | ICD-10-CM | POA: Diagnosis not present

## 2018-09-29 DIAGNOSIS — Z955 Presence of coronary angioplasty implant and graft: Secondary | ICD-10-CM | POA: Diagnosis not present

## 2018-09-29 DIAGNOSIS — J449 Chronic obstructive pulmonary disease, unspecified: Secondary | ICD-10-CM | POA: Diagnosis not present

## 2018-09-29 DIAGNOSIS — I2510 Atherosclerotic heart disease of native coronary artery without angina pectoris: Secondary | ICD-10-CM | POA: Diagnosis not present

## 2018-10-02 ENCOUNTER — Encounter (HOSPITAL_COMMUNITY): Payer: Medicare Other

## 2018-10-02 ENCOUNTER — Encounter (HOSPITAL_COMMUNITY)
Admission: RE | Admit: 2018-10-02 | Discharge: 2018-10-02 | Disposition: A | Discharge status: Home / Self Care | Payer: Medicare Other | Source: Ambulatory Visit | Attending: Interventional Cardiology | Admitting: Interventional Cardiology

## 2018-10-02 ENCOUNTER — Ambulatory Visit (HOSPITAL_COMMUNITY): Payer: Medicare Other

## 2018-10-02 DIAGNOSIS — Z955 Presence of coronary angioplasty implant and graft: Secondary | ICD-10-CM

## 2018-10-02 NOTE — Progress Notes (Signed)
Incomplete Session Note  Patient Details  Name: Sydney Gross MRN: [id number] Date of Birth: 06-Jun-1930 Referring Provider:     CARDIAC REHAB PHASE II ORIENTATION from 08/29/2018 in [facility]  Referring Provider  [name], MD      Sydney Gross did not complete her rehab session.  Sydney Gross has to leave early due to her daughter having a medical crisis. Emotional support provided.Will continue to monitor the patient throughout  the program.Lynsee Wands Harlon Flor, RN,BSN 10/02/2018 12:01 PM

## 2018-10-04 ENCOUNTER — Ambulatory Visit (HOSPITAL_COMMUNITY): Payer: Medicare Other

## 2018-10-04 ENCOUNTER — Encounter (HOSPITAL_COMMUNITY): Payer: Medicare Other

## 2018-10-04 ENCOUNTER — Encounter (HOSPITAL_COMMUNITY)
Admission: RE | Admit: 2018-10-04 | Discharge: 2018-10-04 | Disposition: A | Discharge status: Home / Self Care | Payer: Medicare Other | Source: Ambulatory Visit | Attending: Interventional Cardiology | Admitting: Interventional Cardiology

## 2018-10-04 DIAGNOSIS — J449 Chronic obstructive pulmonary disease, unspecified: Secondary | ICD-10-CM | POA: Diagnosis not present

## 2018-10-04 DIAGNOSIS — I2510 Atherosclerotic heart disease of native coronary artery without angina pectoris: Secondary | ICD-10-CM | POA: Diagnosis not present

## 2018-10-04 DIAGNOSIS — Z955 Presence of coronary angioplasty implant and graft: Secondary | ICD-10-CM

## 2018-10-04 DIAGNOSIS — I10 Essential (primary) hypertension: Secondary | ICD-10-CM | POA: Diagnosis not present

## 2018-10-04 NOTE — Progress Notes (Signed)
Cardiac Individual Treatment Plan  Patient Details  Name: Sydney Gross MRN: [id number] Date of Birth: 10-23-1930 Referring Provider:     CARDIAC REHAB PHASE II ORIENTATION from 08/29/2018 in [facility] CARDIAC REHAB  Referring Provider  [name], MD      Initial Encounter Date:    CARDIAC REHAB PHASE II ORIENTATION from 08/29/2018 in [facility] CARDIAC REHAB  Date  08/29/18      Visit Diagnosis: 07/17/18 DES x2 LAD & D1  Patient's Home Medications on Admission:  Current Outpatient Medications:  .  acetaminophen (TYLENOL) 500 MG tablet, Take 500 mg by mouth 2 (two) times daily as needed for moderate pain or headache. , Disp: , Rfl:  .  albuterol (PROVENTIL) (2.5 MG/3ML) 0.083% nebulizer solution, Take 3 mLs (2.5 mg total) by nebulization every 4 (four) hours as needed for wheezing or shortness of breath., Disp: 75 mL, Rfl: 0 .  aspirin 81 MG chewable tablet, Chew 1 tablet (81 mg total) by mouth daily., Disp: 90 tablet, Rfl: 3 .  budesonide-formoterol (SYMBICORT) 160-4.5 MCG/ACT inhaler, Inhale 2 puffs into the lungs 2 (two) times daily., Disp: , Rfl:  .  cholecalciferol (VITAMIN D) 1000 units tablet, Take 1,000 Units by mouth at bedtime., Disp: , Rfl:  .  clopidogrel (PLAVIX) 75 MG tablet, Take 1 tablet (75 mg total) by mouth daily., Disp: 90 tablet, Rfl: 3 .  Denosumab (PROLIA Aguada), Inject 1 Dose as directed every 6 (six) months. Use as directed. , Disp: , Rfl:  .  diphenhydramine-acetaminophen (TYLENOL PM) 25-500 MG TABS, Take 1 tablet by mouth at bedtime as needed (pain/sleep). , Disp: , Rfl:  .  Evolocumab (REPATHA) 140 MG/ML SOSY, Inject 140 mg into the skin every 14 (fourteen) days., Disp: , Rfl:  .  lisinopril-hydrochlorothiazide (PRINZIDE,ZESTORETIC) 20-25 MG tablet, Take 1 tablet by mouth at bedtime. , Disp: , Rfl:  .  metoprolol succinate (TOPROL-XL) 25 MG 24 hr tablet, Take 0.5 tablets (12.5 mg total) by mouth daily., Disp: 45 tablet,  Rfl: 3 .  nitroGLYCERIN (NITROSTAT) 0.4 MG SL tablet, Place 1 tablet (0.4 mg total) under the tongue every 5 (five) minutes as needed for chest pain., Disp: 25 tablet, Rfl: 3  Past Medical History: Past Medical History:  Diagnosis Date  . Allergic rhinitis   . Arthritis    "maybe a little in my knees and back" (07/17/2018)  . Chronic lower back pain   . Colitis 12/2013   "never had this before"  . COPD (chronic obstructive pulmonary disease) (HCC)    "questionable/Dr. Katrinka Blazing" (07/17/2018)  . Coronary artery disease   . Exertional shortness of breath    "off and on" (07/17/2018)  . Family history of anesthesia complication    "mother; think they gave her too much; c/o pain after OR; gave her more RX; they had to bring her back real quick"  . H/O calcium pyrophosphate deposition disease (CPPD)   . Heart murmur   . Hiatal hernia   . Hyperlipidemia   . Hypertension   . Numbness in feet   . On home oxygen therapy    "I've used it 2 times in 2 yrs" (07/17/2018)  . PAD (peripheral artery disease) (HCC)   . Pneumonia    "1st grade & 4th grade" (07/17/2018)  . Proteinuria   . Shingles   . Stenosis of tear duct     Tobacco Use: Social History   Tobacco Use  Smoking Status Never Smoker  Smokeless  Tobacco Never Used    Labs: Recent Review Flowsheet Data    Labs for ITP Cardiac and Pulmonary Rehab Latest Ref Rng & Units 09/25/2008 01/19/2009 07/17/2018 07/17/2018 07/17/2018   PHART 7.350 - 7.450 - - - - 7.324(L)   PCO2ART 32.0 - 48.0 mmHg - - - - 42.1   HCO3 20.0 - 28.0 mmol/L - - 21.2 22.0 21.9   TCO2 22 - 32 mmol/L 25 27 22 23 23    ACIDBASEDEF 0.0 - 2.0 mmol/L - - 5.0(H) 5.0(H) 4.0(H)   O2SAT % - - 77.0 76.0 100.0      Capillary Blood Glucose: Lab Results  Component Value Date   GLUCAP 73 07/17/2018     Exercise Target Goals: Exercise Program Goal: Individual exercise prescription set using results from initial 6 min walk test and THRR while considering  patient's activity  barriers and safety.   Exercise Prescription Goal: Initial exercise prescription builds to 30-45 minutes a day of aerobic activity, 2-3 days per week.  Home exercise guidelines will be given to patient during program as part of exercise prescription that the participant will acknowledge.  Activity Barriers & Risk Stratification: Activity Barriers & Cardiac Risk Stratification - 08/29/18 0903      Activity Barriers & Cardiac Risk Stratification   Activity Barriers  Arthritis;Back Problems;Other (comment)    Comments  Claudication pain from PAD-bilateral. Prior back surgery-back pain.    Cardiac Risk Stratification  High       6 Minute Walk: 6 Minute Walk    Row Name 08/29/18 0856         6 Minute Walk   Phase  Initial     Distance  1000 feet     Walk Time  6 minutes     # of Rest Breaks  0     MPH  1.89     METS  1.25     RPE  15     Perceived Dyspnea   0     VO2 Peak  4.36     Symptoms  Yes (comment)     Comments  Patient c/o bilateral claudication pain, which she rated a "10/10" on the pain scale. Pt given cart at 3 minutes.     Resting HR  53 bpm     Resting BP  112/79     Resting Oxygen Saturation   98 %     Exercise Oxygen Saturation  during 6 min walk  99 %     Max Ex. HR  78 bpm     Max Ex. BP  120/80     2 Minute Post BP  104/62        Oxygen Initial Assessment:   Oxygen Re-Evaluation:   Oxygen Discharge (Final Oxygen Re-Evaluation):   Initial Exercise Prescription: Initial Exercise Prescription - 08/29/18 1200      Date of Initial Exercise RX and Referring Provider   Date  08/29/18    Referring Provider  [name], MD    Expected Discharge Date  12/04/18      Treadmill   MPH  1.5    Grade  0    Minutes  10    METs  2.15      Bike   Level  0.4    Minutes  10    METs  2.44      NuStep   Level  2    SPM  85    Minutes  10    METs  2.1      Prescription Details   Frequency (times per week)  3    Duration  Progress to 30 minutes of  continuous aerobic without signs/symptoms of physical distress      Intensity   THRR 40-80% of Max Heartrate  54-107    Ratings of Perceived Exertion  11-13    Perceived Dyspnea  0-4      Progression   Progression  Continue to progress workloads to maintain intensity without signs/symptoms of physical distress.      Resistance Training   Training Prescription  Yes    Weight  2lbs    Reps  10-15       Perform Capillary Blood Glucose checks as needed.  Exercise Prescription Changes: Exercise Prescription Changes    Row Name 09/06/18 [phone] 09/13/18 0954 09/29/18 0953         Response to Exercise   Blood Pressure (Admit)  108/60  110/60  100/60     Blood Pressure (Exercise)  142/78  124/72  104/60     Blood Pressure (Exit)  114/72  100/62  106/60     Heart Rate (Admit)  74 bpm  73 bpm  77 bpm     Heart Rate (Exercise)  104 bpm  97 bpm  111 bpm     Heart Rate (Exit)  67 bpm  64 bpm  77 bpm     Rating of Perceived Exertion (Exercise)  13  11  13      Symptoms  claudication  -  -     Duration  Progress to 30 minutes of  aerobic without signs/symptoms of physical distress  Progress to 30 minutes of  aerobic without signs/symptoms of physical distress  Progress to 30 minutes of  aerobic without signs/symptoms of physical distress     Intensity  THRR unchanged  THRR unchanged  THRR unchanged       Progression   Progression  Continue to progress workloads to maintain intensity without signs/symptoms of physical distress.  Continue to progress workloads to maintain intensity without signs/symptoms of physical distress.  Continue to progress workloads to maintain intensity without signs/symptoms of physical distress.     Average METs  2.3  2.3  2.4       Resistance Training   Training Prescription  No Relaxation day, no weights  No Relaxation day, no weights  Yes     Weight  -  -  3lbs     Reps  -  -  10-15     Time  -  -  10 Minutes       Interval Training   Interval Training  No   No  No       Treadmill   MPH  -  -  -     Grade  -  -  -     Minutes  -  -  -     METs  -  -  -       Bike   Level  0.4  0.4  0.4     Minutes  10  10  10      METs  2.41  2.42  2.44       NuStep   Level  2  2  4      SPM  85  85  85     Minutes  10  10  10      METs  2.4  2.8  2.6  Track   Laps  7  4  6      Minutes  10  10  10      METs  2.22  1.7  2.03       Home Exercise Plan   Plans to continue exercise at  -  -  Home (comment)     Frequency  -  -  Add 2 additional days to program exercise sessions.     Initial Home Exercises Provided  -  -  09/18/18        Exercise Comments: Exercise Comments    Row Name 09/06/18 1100 09/13/18 1025 09/18/18 1113 09/29/18 1019     Exercise Comments  Patient tolerated 1st session of exercise well with c/o claudication pain.  Reviewed METs with patient.  Reviewed HEP with patient. will continue to monitor and progress as tolerated.   Reviewed METs and goals with patient.       Exercise Goals and Review: Exercise Goals    Row Name 08/29/18 0903             Exercise Goals   Increase Physical Activity  Yes       Intervention  Provide advice, education, support and counseling about physical activity/exercise needs.;Develop an individualized exercise prescription for aerobic and resistive training based on initial evaluation findings, risk stratification, comorbidities and participant's personal goals.       Expected Outcomes  Short Term: Attend rehab on a regular basis to increase amount of physical activity.;Long Term: Exercising regularly at least 3-5 days a week.;Long Term: Add in home exercise to make exercise part of routine and to increase amount of physical activity.       Increase Strength and Stamina  Yes       Intervention  Provide advice, education, support and counseling about physical activity/exercise needs.;Develop an individualized exercise prescription for aerobic and resistive training based on initial evaluation  findings, risk stratification, comorbidities and participant's personal goals.       Expected Outcomes  Short Term: Increase workloads from initial exercise prescription for resistance, speed, and METs.;Short Term: Perform resistance training exercises routinely during rehab and add in resistance training at home;Long Term: Improve cardiorespiratory fitness, muscular endurance and strength as measured by increased METs and functional capacity ( )       Able to understand and use rate of perceived exertion (RPE) scale  Yes       Intervention  Provide education and explanation on how to use RPE scale       Expected Outcomes  Short Term: Able to use RPE daily in rehab to express subjective intensity level;Long Term:  Able to use RPE to guide intensity level when exercising independently       Knowledge and understanding of Target Heart Rate Range (THRR)  Yes       Intervention  Provide education and explanation of THRR including how the numbers were predicted and where they are located for reference       Expected Outcomes  Short Term: Able to state/look up THRR;Long Term: Able to use THRR to govern intensity when exercising independently;Short Term: Able to use daily as guideline for intensity in rehab       Able to check pulse independently  Yes       Intervention  Provide education and demonstration on how to check pulse in carotid and radial arteries.;Review the importance of being able to check your own pulse for safety during independent exercise  Expected Outcomes  Short Term: Able to explain why pulse checking is important during independent exercise;Long Term: Able to check pulse independently and accurately       Understanding of Exercise Prescription  Yes       Intervention  Provide education, explanation, and written materials on patient's individual exercise prescription       Expected Outcomes  Short Term: Able to explain program exercise prescription;Long Term: Able to explain home  exercise prescription to exercise independently       Improve claudication pain toleration; Improve walking ability  Yes       Intervention  Attend education sessions to aid in risk factor modification and understanding of disease process       Expected Outcomes  Short Term: Improve walking distance/time to onset of claudication pain;Long Term: Improve walking ability and toleration to claudication          Exercise Goals Re-Evaluation : Exercise Goals Re-Evaluation    Row Name 09/06/18 1100 09/18/18 1111 09/29/18 1019         Exercise Goal Re-Evaluation   Exercise Goals Review  Improve claudication pain tolerance and improve walking ability;Able to understand and use rate of perceived exertion (RPE) scale;Increase Physical Activity  Able to understand and use rate of perceived exertion (RPE) scale;Increase Physical Activity;Knowledge and understanding of Target Heart Rate Range (THRR);Understanding of Exercise Prescription;Increase Strength and Stamina  Able to understand and use rate of perceived exertion (RPE) scale;Increase Physical Activity;Knowledge and understanding of Target Heart Rate Range (THRR);Understanding of Exercise Prescription;Increase Strength and Stamina     Comments  Patient able to understand and use RPE scale appropriately. Pt c/o claudication pain during walking station.  Reviewed HEP with patient. Reviewed THHR, RPE, NTG use, weather conditions, and end points of exercise.   Patient states she tries to do some exercise daily. Pt does seated stretches and some walking at home.     Expected Outcomes  Increase workloads as tolerated to help improve cardiorespiratory fitness. Pt will have less pain with walking.  Patient will continue to walk everyday for 20 minutes. Will continue to monitor and progress as tolerated.   Progress workloads as tolerated to help increase strength and stamina.        Discharge Exercise Prescription (Final Exercise Prescription Changes): Exercise  Prescription Changes - 09/29/18 0953      Response to Exercise   Blood Pressure (Admit)  100/60    Blood Pressure (Exercise)  104/60    Blood Pressure (Exit)  106/60    Heart Rate (Admit)  77 bpm    Heart Rate (Exercise)  111 bpm    Heart Rate (Exit)  77 bpm    Rating of Perceived Exertion (Exercise)  13    Duration  Progress to 30 minutes of  aerobic without signs/symptoms of physical distress    Intensity  THRR unchanged      Progression   Progression  Continue to progress workloads to maintain intensity without signs/symptoms of physical distress.    Average METs  2.4      Resistance Training   Training Prescription  Yes    Weight  3lbs    Reps  10-15    Time  10 Minutes      Interval Training   Interval Training  No      Bike   Level  0.4    Minutes  10    METs  2.44      NuStep   Level  4  SPM  85    Minutes  10    METs  2.6      Track   Laps  6    Minutes  10    METs  2.03      Home Exercise Plan   Plans to continue exercise at  Home (comment)    Frequency  Add 2 additional days to program exercise sessions.    Initial Home Exercises Provided  09/18/18       Nutrition:  Target Goals: Understanding of nutrition guidelines, daily intake of sodium 1500mg , cholesterol 200mg , calories 30% from fat and 7% or less from saturated fats, daily to have 5 or more servings of fruits and vegetables.  Biometrics: Pre Biometrics - 08/29/18 1249      Pre Biometrics   Height  4\' 11"  (1.499 m)    Weight  53.4 kg    Waist Circumference  31 inches    Hip Circumference  37.25 inches    Waist to Hip Ratio  0.83 %    BMI (Calculated)  23.77    Triceps Skinfold  23.5 mm    % Body Fat  36.5 %    Grip Strength  13.5 kg    Flexibility  11.25 in    Single Leg Stand  18.28 seconds        Nutrition Therapy Plan and Nutrition Goals: Nutrition Therapy & Goals - 08/29/18 1001      Nutrition Therapy   Diet  heart healthy      Personal Nutrition Goals   Nutrition  Goal  Pt to identify food quantities necessary to achieve weight maintenance or weight gain of 4-5 lbs lbs. at graduation from cardiac rehab.     Personal Goal #2  Pt to identify and limit food sources of saturated fat, trans fat, refined carbohydrates and sodium      Intervention Plan   Intervention  Prescribe, educate and counsel regarding individualized specific dietary modifications aiming towards targeted core components such as weight, hypertension, lipid management, diabetes, heart failure and other comorbidities.    Expected Outcomes  Short Term Goal: Understand basic principles of dietary content, such as calories, fat, sodium, cholesterol and nutrients.;Long Term Goal: Adherence to prescribed nutrition plan.       Nutrition Assessments: Nutrition Assessments - 08/29/18 1002      MEDFICTS Scores   Pre Score  21       Nutrition Goals Re-Evaluation: Nutrition Goals Re-Evaluation    Row Name 08/29/18 1001             Goals   Current Weight  117 lb 11.6 oz (53.4 kg)          Nutrition Goals Re-Evaluation: Nutrition Goals Re-Evaluation    Row Name 08/29/18 1001             Goals   Current Weight  117 lb 11.6 oz (53.4 kg)          Nutrition Goals Discharge (Final Nutrition Goals Re-Evaluation): Nutrition Goals Re-Evaluation - 08/29/18 1001      Goals   Current Weight  117 lb 11.6 oz (53.4 kg)       Psychosocial: Target Goals: Acknowledge presence or absence of significant depression and/or stress, maximize coping skills, provide positive support system. Participant is able to verbalize types and ability to use techniques and skills needed for reducing stress and depression.  Initial Review & Psychosocial Screening: Initial Psych Review & Screening - 08/29/18 1026  Initial Review   Current issues with  None Identified      Family Dynamics   Good Support System?  Yes   husband and Dennard Nip, daughter      Barriers   Psychosocial barriers to  participate in program  There are no identifiable barriers or psychosocial needs.      Screening Interventions   Interventions  Encouraged to exercise       Quality of Life Scores: Quality of Life - 08/29/18 1027      Quality of Life   Select  Quality of Life      Quality of Life Scores   Health/Function Pre  23.13 %    Socioeconomic Pre  27.75 %    Psych/Spiritual Pre  26.57 %    Family Pre  30 %    GLOBAL Pre  25.96 %      Scores of 19 and below usually indicate a poorer quality of life in these areas.  A difference of  2-3 points is a clinically meaningful difference.  A difference of 2-3 points in the total score of the Quality of Life Index has been associated with significant improvement in overall quality of life, self-image, physical symptoms, and general health in studies assessing change in quality of life.  PHQ-9: Recent Review Flowsheet Data    Depression screen [facility] 2/9 09/06/2018 12/25/2013   Decreased Interest 0 0   Down, Depressed, Hopeless 0 0   PHQ - 2 Score 0 0     Interpretation of Total Score  Total Score Depression Severity:  1-4 = Minimal depression, 5-9 = Mild depression, 10-14 = Moderate depression, 15-19 = Moderately severe depression, 20-27 = Severe depression   Psychosocial Evaluation and Intervention:   Psychosocial Re-Evaluation: Psychosocial Re-Evaluation    Row Name 10/04/18 1636             Psychosocial Re-Evaluation   Current issues with  Current Stress Concerns       Comments  Pat's daughter has MS. Will continue to provide emotional support as needed       Interventions  Stress management education       Continue Psychosocial Services   Follow up required by staff         Initial Review   Source of Stress Concerns  Family          Psychosocial Discharge (Final Psychosocial Re-Evaluation): Psychosocial Re-Evaluation - 10/04/18 1636      Psychosocial Re-Evaluation   Current issues with  Current Stress Concerns    Comments   Pat's daughter has MS. Will continue to provide emotional support as needed    Interventions  Stress management education    Continue Psychosocial Services   Follow up required by staff      Initial Review   Source of Stress Concerns  Family       Vocational Rehabilitation: Provide vocational rehab assistance to qualifying candidates.   Vocational Rehab Evaluation & Intervention: Vocational Rehab - 08/29/18 1027      Initial Vocational Rehab Evaluation & Intervention   Assessment shows need for Vocational Rehabilitation  No   retired       Education: Education Goals: Education classes will be provided on a weekly basis, covering required topics. Participant will state understanding/return demonstration of topics presented.  Learning Barriers/Preferences: Learning Barriers/Preferences - 08/29/18 1216      Learning Barriers/Preferences   Learning Barriers  Sight    Learning Preferences  Written Material;Video  Education Topics: Count Your Pulse:  -Group instruction provided by verbal instruction, demonstration, patient participation and written materials to support subject.  Instructors address importance of being able to find your pulse and how to count your pulse when at home without a heart monitor.  Patients get hands on experience counting their pulse with staff help and individually.   Heart Attack, Angina, and Risk Factor Modification:  -Group instruction provided by verbal instruction, video, and written materials to support subject.  Instructors address signs and symptoms of angina and heart attacks.    Also discuss risk factors for heart disease and how to make changes to improve heart health risk factors.   Functional Fitness:  -Group instruction provided by verbal instruction, demonstration, patient participation, and written materials to support subject.  Instructors address safety measures for doing things around the house.  Discuss how to get up and down  off the floor, how to pick things up properly, how to safely get out of a chair without assistance, and balance training.   Meditation and Mindfulness:  -Group instruction provided by verbal instruction, patient participation, and written materials to support subject.  Instructor addresses importance of mindfulness and meditation practice to help reduce stress and improve awareness.  Instructor also leads participants through a meditation exercise.    Stretching for Flexibility and Mobility:  -Group instruction provided by verbal instruction, patient participation, and written materials to support subject.  Instructors lead participants through series of stretches that are designed to increase flexibility thus improving mobility.  These stretches are additional exercise for major muscle groups that are typically performed during regular warm up and cool down.   Hands Only CPR:  -Group verbal, video, and participation provides a basic overview of AHA guidelines for community CPR. Role-play of emergencies allow participants the opportunity to practice calling for help and chest compression technique with discussion of AED use.   Hypertension: -Group verbal and written instruction that provides a basic overview of hypertension including the most recent diagnostic guidelines, risk factor reduction with self-care instructions and medication management.    Nutrition I class: Heart Healthy Eating:  -Group instruction provided by PowerPoint slides, verbal discussion, and written materials to support subject matter. The instructor gives an explanation and review of the Therapeutic Lifestyle Changes diet recommendations, which includes a discussion on lipid goals, dietary fat, sodium, fiber, plant stanol/sterol esters, sugar, and the components of a well-balanced, healthy diet.   Nutrition II class: Lifestyle Skills:  -Group instruction provided by PowerPoint slides, verbal discussion, and written  materials to support subject matter. The instructor gives an explanation and review of label reading, grocery shopping for heart health, heart healthy recipe modifications, and ways to make healthier choices when eating out.   Diabetes Question & Answer:  -Group instruction provided by PowerPoint slides, verbal discussion, and written materials to support subject matter. The instructor gives an explanation and review of diabetes co-morbidities, pre- and post-prandial blood glucose goals, pre-exercise blood glucose goals, signs, symptoms, and treatment of hypoglycemia and hyperglycemia, and foot care basics.   Diabetes Blitz:  -Group instruction provided by PowerPoint slides, verbal discussion, and written materials to support subject matter. The instructor gives an explanation and review of the physiology behind type 1 and type 2 diabetes, diabetes medications and rational behind using different medications, pre- and post-prandial blood glucose recommendations and Hemoglobin A1c goals, diabetes diet, and exercise including blood glucose guidelines for exercising safely.    Portion Distortion:  -Group instruction provided by PowerPoint slides,  verbal discussion, written materials, and food models to support subject matter. The instructor gives an explanation of serving size versus portion size, changes in portions sizes over the last 20 years, and what consists of a serving from each food group.   Stress Management:  -Group instruction provided by verbal instruction, video, and written materials to support subject matter.  Instructors review role of stress in heart disease and how to cope with stress positively.     Exercising on Your Own:  -Group instruction provided by verbal instruction, power point, and written materials to support subject.  Instructors discuss benefits of exercise, components of exercise, frequency and intensity of exercise, and end points for exercise.  Also discuss use of  nitroglycerin and activating EMS.  Review options of places to exercise outside of rehab.  Review guidelines for sex with heart disease.   Cardiac Drugs I:  -Group instruction provided by verbal instruction and written materials to support subject.  Instructor reviews cardiac drug classes: antiplatelets, anticoagulants, beta blockers, and statins.  Instructor discusses reasons, side effects, and lifestyle considerations for each drug class.   Cardiac Drugs II:  -Group instruction provided by verbal instruction and written materials to support subject.  Instructor reviews cardiac drug classes: angiotensin converting enzyme inhibitors (ACE-I), angiotensin II receptor blockers (ARBs), nitrates, and calcium channel blockers.  Instructor discusses reasons, side effects, and lifestyle considerations for each drug class.   Anatomy and Physiology of the Circulatory System:  Group verbal and written instruction and models provide basic cardiac anatomy and physiology, with the coronary electrical and arterial systems. Review of: AMI, Angina, Valve disease, Heart Failure, Peripheral Artery Disease, Cardiac Arrhythmia, Pacemakers, and the ICD.   Other Education:  -Group or individual verbal, written, or video instructions that support the educational goals of the cardiac rehab program.   Holiday Eating Survival Tips:  -Group instruction provided by PowerPoint slides, verbal discussion, and written materials to support subject matter. The instructor gives patients tips, tricks, and techniques to help them not only survive but enjoy the holidays despite the onslaught of food that accompanies the holidays.   Knowledge Questionnaire Score: Knowledge Questionnaire Score - 08/29/18 1027      Knowledge Questionnaire Score   Pre Score  18/24       Core Components/Risk Factors/Patient Goals at Admission: Personal Goals and Risk Factors at Admission - 08/29/18 1214      Core Components/Risk  Factors/Patient Goals on Admission   Hypertension  Yes    Intervention  Provide education on lifestyle modifcations including regular physical activity/exercise, weight management, moderate sodium restriction and increased consumption of fresh fruit, vegetables, and low fat dairy, alcohol moderation, and smoking cessation.;Monitor prescription use compliance.    Expected Outcomes  Short Term: Continued assessment and intervention until BP is < 140/54mm HG in hypertensive participants. < 130/23mm HG in hypertensive participants with diabetes, heart failure or chronic kidney disease.;Long Term: Maintenance of blood pressure at goal levels.    Lipids  Yes    Intervention  Provide education and support for participant on nutrition & aerobic/resistive exercise along with prescribed medications to achieve LDL 70mg , HDL >40mg .    Expected Outcomes  Short Term: Participant states understanding of desired cholesterol values and is compliant with medications prescribed. Participant is following exercise prescription and nutrition guidelines.;Long Term: Cholesterol controlled with medications as prescribed, with individualized exercise RX and with personalized nutrition plan. Value goals: LDL < 70mg , HDL > 40 mg.       Core Components/Risk Factors/Patient  Goals Review:  Goals and Risk Factor Review    Row Name 09/07/18 1054 10/04/18 1637           Core Components/Risk Factors/Patient Goals Review   Personal Goals Review  Hypertension;Lipids  Hypertension;Lipids;Stress      Review  09/06/18 was Pat's first days of exercise at cardiac rehab  Pat's vital sign have been stable. Dennie Bible is concerned about her daughter health      Expected Outcomes  Patient will participate in phase 2 cardiac rehab. Follow exercise, nutrition and lifestyle modfication opportunities.  Patient will participate in phase 2 cardiac rehab. Follow exercise, nutrition and lifestyle modfication opportunities.         Core Components/Risk  Factors/Patient Goals at Discharge (Final Review):  Goals and Risk Factor Review - 10/04/18 1637      Core Components/Risk Factors/Patient Goals Review   Personal Goals Review  Hypertension;Lipids;Stress    Review  Pat's vital sign have been stable. Dennie Bible is concerned about her daughter health    Expected Outcomes  Patient will participate in phase 2 cardiac rehab. Follow exercise, nutrition and lifestyle modfication opportunities.       ITP Comments: ITP Comments    Row Name 08/29/18 0909 09/07/18 1056 10/04/18 1633       ITP Comments  Dr. Armanda Magic, Medical Director   30 Day ITP review. 09/06/18 was Pat's first day of exercise  30 Day ITP Review. Patient with good participation despite having PAD        Comments: See ITP comments.Gladstone Lighter, RN,BSN 10/04/2018 4:41 PM

## 2018-10-04 NOTE — Progress Notes (Signed)
Sydney Gross 84 y.o. female Nutrition Note Spoke with pt. Nutrition plan and goals reviewed with pt. Pt is following heart healthy diet. Pt wants to maintain weight or gain ~5 lbs.Pt noted again today that in the last year she has lost ~5 lbs. Attributes this to a decreased appetite. Pt reports that she has continued to eat breakfast in addition to her regular ~2 meals a day. Reviewedwt maintenance/ gaintips discussed at orientation (how to build a healthy plate, portion sizes, eating frequently across the day).Pt has now added in at least one snack daily between meals. Has explored several meal prep recipes and breakfast recipes.Pt expressed understanding of the information reviewed. Pt aware of nutrition education classes offered and wouldlike to attend nutrition classes.  No results found for: HGBA1C  Wt Readings from Last 3 Encounters:  08/29/18 117 lb 11.6 oz (53.4 kg)  08/08/18 115 lb 12.8 oz (52.5 kg)  07/18/18 114 lb 10.2 oz (52 kg)    Nutrition Diagnosis  Food-and nutrition-related knowledge deficit related to lack of exposure to information as related to diagnosis of: ?CVD   Nutrition Intervention ? Pt's individual nutrition plan reviewed with pt.   Goal(s)  Pt to identify and limit food sources of saturated fat, trans fat, refined carbohydrates and sodium  Pt to identify food quantities necessary to achieve weightmaintenance or weight gainof 4-5 lbslbs. at graduation from cardiac rehab.   Plan:   Pt to attend nutrition classes ? Nutrition I ? Nutrition II ? Portion Distortion   Will provide client-centered nutrition education as part of interdisciplinary care  Monitor and evaluate progress toward nutrition goal with team.    Ross Marcus, MS, RD, LDN 10/04/2018 10:43 AM

## 2018-10-06 ENCOUNTER — Ambulatory Visit (HOSPITAL_COMMUNITY): Payer: Medicare Other

## 2018-10-06 ENCOUNTER — Encounter (HOSPITAL_COMMUNITY)
Admission: RE | Admit: 2018-10-06 | Discharge: 2018-10-06 | Disposition: A | Discharge status: Home / Self Care | Payer: Medicare Other | Source: Ambulatory Visit | Attending: Interventional Cardiology | Admitting: Interventional Cardiology

## 2018-10-06 ENCOUNTER — Encounter (HOSPITAL_COMMUNITY): Payer: Medicare Other

## 2018-10-06 DIAGNOSIS — I2510 Atherosclerotic heart disease of native coronary artery without angina pectoris: Secondary | ICD-10-CM | POA: Diagnosis not present

## 2018-10-06 DIAGNOSIS — Z955 Presence of coronary angioplasty implant and graft: Secondary | ICD-10-CM

## 2018-10-06 DIAGNOSIS — I10 Essential (primary) hypertension: Secondary | ICD-10-CM | POA: Insufficient documentation

## 2018-10-06 DIAGNOSIS — J449 Chronic obstructive pulmonary disease, unspecified: Secondary | ICD-10-CM | POA: Diagnosis not present

## 2018-10-09 ENCOUNTER — Encounter (HOSPITAL_COMMUNITY): Payer: Medicare Other

## 2018-10-09 ENCOUNTER — Ambulatory Visit (HOSPITAL_COMMUNITY): Payer: Medicare Other

## 2018-10-09 ENCOUNTER — Encounter (HOSPITAL_COMMUNITY)
Admission: RE | Admit: 2018-10-09 | Discharge: 2018-10-09 | Disposition: A | Discharge status: Home / Self Care | Payer: Medicare Other | Source: Ambulatory Visit | Attending: Interventional Cardiology | Admitting: Interventional Cardiology

## 2018-10-09 DIAGNOSIS — Z955 Presence of coronary angioplasty implant and graft: Secondary | ICD-10-CM

## 2018-10-09 DIAGNOSIS — I2510 Atherosclerotic heart disease of native coronary artery without angina pectoris: Secondary | ICD-10-CM | POA: Diagnosis not present

## 2018-10-09 DIAGNOSIS — I10 Essential (primary) hypertension: Secondary | ICD-10-CM | POA: Diagnosis not present

## 2018-10-09 DIAGNOSIS — J449 Chronic obstructive pulmonary disease, unspecified: Secondary | ICD-10-CM | POA: Diagnosis not present

## 2018-10-11 ENCOUNTER — Encounter (HOSPITAL_COMMUNITY): Payer: Medicare Other

## 2018-10-11 ENCOUNTER — Ambulatory Visit (HOSPITAL_COMMUNITY): Payer: Medicare Other

## 2018-10-11 ENCOUNTER — Encounter (HOSPITAL_COMMUNITY)
Admission: RE | Admit: 2018-10-11 | Discharge: 2018-10-11 | Disposition: A | Discharge status: Home / Self Care | Payer: Medicare Other | Source: Ambulatory Visit | Attending: Interventional Cardiology | Admitting: Interventional Cardiology

## 2018-10-11 DIAGNOSIS — Z955 Presence of coronary angioplasty implant and graft: Secondary | ICD-10-CM

## 2018-10-11 DIAGNOSIS — I2510 Atherosclerotic heart disease of native coronary artery without angina pectoris: Secondary | ICD-10-CM | POA: Diagnosis not present

## 2018-10-11 DIAGNOSIS — J449 Chronic obstructive pulmonary disease, unspecified: Secondary | ICD-10-CM | POA: Diagnosis not present

## 2018-10-11 DIAGNOSIS — I10 Essential (primary) hypertension: Secondary | ICD-10-CM | POA: Diagnosis not present

## 2018-10-13 ENCOUNTER — Ambulatory Visit (HOSPITAL_COMMUNITY): Payer: Medicare Other

## 2018-10-13 ENCOUNTER — Encounter (HOSPITAL_COMMUNITY)
Admission: RE | Admit: 2018-10-13 | Discharge: 2018-10-13 | Disposition: A | Discharge status: Home / Self Care | Payer: Medicare Other | Source: Ambulatory Visit | Attending: Interventional Cardiology | Admitting: Interventional Cardiology

## 2018-10-13 ENCOUNTER — Encounter (HOSPITAL_COMMUNITY): Payer: Medicare Other

## 2018-10-13 DIAGNOSIS — Z955 Presence of coronary angioplasty implant and graft: Secondary | ICD-10-CM | POA: Diagnosis not present

## 2018-10-13 DIAGNOSIS — I10 Essential (primary) hypertension: Secondary | ICD-10-CM | POA: Diagnosis not present

## 2018-10-13 DIAGNOSIS — I2510 Atherosclerotic heart disease of native coronary artery without angina pectoris: Secondary | ICD-10-CM | POA: Diagnosis not present

## 2018-10-13 DIAGNOSIS — Z23 Encounter for immunization: Secondary | ICD-10-CM | POA: Diagnosis not present

## 2018-10-13 DIAGNOSIS — J449 Chronic obstructive pulmonary disease, unspecified: Secondary | ICD-10-CM | POA: Diagnosis not present

## 2018-10-15 NOTE — Progress Notes (Signed)
Cardiology Office Note:    Date:  10/17/2018   ID:  Sydney Gross, DOB October 12, 1930, MRN [id number]  PCP:  Lupita Raider, MD  Cardiologist:  Lesleigh Noe, MD   Referring MD: Lupita Raider, MD   Chief Complaint  Patient presents with  . Coronary Artery Disease    History of Present Illness:    Sydney Gross is a 84 y.o. female with a hx of Supravalvular aortic stenosis, familial hypercholesterolemia with LDL cholesterol greater than 400 (Rapatha started}, PAD, hypertension, diastolic heart failure (stable), chronic dyspnea, and CAD with LAD DES 07/17/2018.Brilinta caused dyspnea.  She is doing okay.  She still having shortness of breath.  Shortness of breath occurs with physical activity.  She is having no chest discomfort.  Dyspnea is improved since coronary intervention and switching Brilinta to Plavix.  She still has residual shortness of breath and carries a diagnosis of COPD.  Likely current because of shortness of breath is intrinsic lung disease plus minus a component of diastolic heart failure.  No medication side effects.  Denies orthopnea, PND, and peripheral edema.  Bilateral calf claudication.  Familial hypercholesterolemia.  She is on Repatha but there are no recent lipid values.  This is managed by her primary care physician, Dr. Cam Hai.  Last available LDL was 410 in August 2018.  Past Medical History:  Diagnosis Date  . Allergic rhinitis   . Arthritis    "maybe a little in my knees and back" (07/17/2018)  . Chronic lower back pain   . Colitis 12/2013   "never had this before"  . COPD (chronic obstructive pulmonary disease) (HCC)    "questionable/Dr. Katrinka Blazing" (07/17/2018)  . Coronary artery disease   . Exertional shortness of breath    "off and on" (07/17/2018)  . Family history of anesthesia complication    "mother; think they gave her too much; c/o pain after OR; gave her more RX; they had to bring her back real quick"  . H/O calcium  pyrophosphate deposition disease (CPPD)   . Heart murmur   . Hiatal hernia   . Hyperlipidemia   . Hypertension   . Numbness in feet   . On home oxygen therapy    "I've used it 2 times in 2 yrs" (07/17/2018)  . PAD (peripheral artery disease) (HCC)   . Pneumonia    "1st grade & 4th grade" (07/17/2018)  . Proteinuria   . Shingles   . Stenosis of tear duct     Past Surgical History:  Procedure Laterality Date  . ABDOMINAL HYSTERECTOMY  1960's  . APPENDECTOMY    . BACK SURGERY    . BLEPHAROPLASTY Bilateral 05/2017  . CARDIAC CATHETERIZATION    . CATARACT EXTRACTION, BILATERAL Bilateral   . CESAREAN SECTION  1957; 1960  . CORONARY STENT INTERVENTION N/A 07/17/2018   Procedure: CORONARY STENT INTERVENTION;  Surgeon: Lyn Records, MD;  Location: [facility] INVASIVE CV LAB;  Service: Cardiovascular;  Laterality: N/A;  . DACROCYSTORHINOSTOMY W/ JONES TUBE Bilateral 05/2017   "put tear duct in my eyes"  . EYE SURGERY    . LAPAROSCOPIC CHOLECYSTECTOMY  ~ 2009  . LUMBAR DISC SURGERY  ?2004  . RIGHT/LEFT HEART CATH AND CORONARY ANGIOGRAPHY N/A 07/17/2018   Procedure: RIGHT/LEFT HEART CATH AND CORONARY ANGIOGRAPHY;  Surgeon: Lyn Records, MD;  Location: [facility] INVASIVE CV LAB;  Service: Cardiovascular;  Laterality: N/A;  . TONSILLECTOMY AND ADENOIDECTOMY  ?1940's    Current Medications: Current Meds  Medication Sig  .  acetaminophen (TYLENOL) 500 MG tablet Take 500 mg by mouth 2 (two) times daily as needed for moderate pain or headache.   . albuterol (PROVENTIL) (2.5 MG/3ML) 0.083% nebulizer solution Take 3 mLs (2.5 mg total) by nebulization every 4 (four) hours as needed for wheezing or shortness of breath.  [name] aspirin 81 MG chewable tablet Chew 1 tablet (81 mg total) by mouth daily.  . budesonide-formoterol (SYMBICORT) 160-4.5 MCG/ACT inhaler Inhale 2 puffs into the lungs 2 (two) times daily.  . cholecalciferol (VITAMIN D) 1000 units tablet Take 1,000 Units by mouth at bedtime.  . clopidogrel  (PLAVIX) 75 MG tablet Take 1 tablet (75 mg total) by mouth daily.  . Denosumab (PROLIA [place]) Inject 1 Dose as directed every 6 (six) months. Use as directed.   . diphenhydramine-acetaminophen (TYLENOL PM) 25-500 MG TABS Take 1 tablet by mouth at bedtime as needed (pain/sleep).   . Evolocumab (REPATHA) 140 MG/ML SOSY Inject 140 mg into the skin every 14 (fourteen) days.  [name] lisinopril-hydrochlorothiazide (PRINZIDE,ZESTORETIC) 20-25 MG tablet Take 1 tablet by mouth at bedtime.   . metoprolol succinate (TOPROL-XL) 25 MG 24 hr tablet Take 0.5 tablets (12.5 mg total) by mouth daily.  . nitroGLYCERIN (NITROSTAT) 0.4 MG SL tablet Place 1 tablet (0.4 mg total) under the tongue every 5 (five) minutes as needed for chest pain.     Allergies:   Codeine; Crestor [rosuvastatin]; Darvon [propoxyphene]; Lipitor [atorvastatin]; and Zocor [simvastatin]   Social History   Socioeconomic History  . Marital status: Married    Spouse name: Delton See  . Number of children: Not on file  . Years of education: 2  . Highest education level: High school graduate  Occupational History  . Occupation: Retired  Engineer, production  . Financial resource strain: Not very hard  . Food insecurity:    Worry: Not on file    Inability: Not on file  . Transportation needs:    Medical: No    Non-medical: No  Tobacco Use  . Smoking status: Never Smoker  . Smokeless tobacco: Never Used  Substance and Sexual Activity  . Alcohol use: Yes    Alcohol/week: 4.0 standard drinks    Types: 4 Glasses of wine per week    Comment: glass of wine every other night  . Drug use: Never  . Sexual activity: Not Currently  Lifestyle  . Physical activity:    Days per week: 0 days    Minutes per session: 0 min  . Stress: Not at all  Relationships  . Social connections:    Talks on phone: Not on file    Gets together: Not on file    Attends religious service: Not on file    Active member of club or organization: Not on file    Attends  meetings of clubs or organizations: Not on file    Relationship status: Not on file  Other Topics Concern  . Not on file  Social History Narrative  . Not on file     Family History: The patient's family history includes CVA in her mother; Cancer in her brother; Emphysema (age of onset: 44) in her father.  ROS:   Please see the history of present illness.    Easy bruising, bilateral leg pain with activity, shortness of breath without orthopnea or edema.  All other systems reviewed and are negative.  EKGs/Labs/Other Studies Reviewed:    The following studies were reviewed today:  2 D Doppler Echocardiogram 04/27/2017:  Study Conclusions  - Left  ventricle: The cavity size was normal. Wall thickness was   increased in a pattern of mild LVH. Systolic function was   vigorous. The estimated ejection fraction was in the range of 65%   to 70%. Wall motion was normal; there were no regional wall   motion abnormalities. Features are consistent with a pseudonormal   left ventricular filling pattern, with concomitant abnormal   relaxation and increased filling pressure (grade 2 diastolic   dysfunction). - Left atrium: The atrium was moderately dilated. - Tricuspid valve: There was moderate regurgitation. - Pulmonary arteries: Systolic pressure was moderately increased.   PA peak pressure: 41 mm Hg (S).  EKG:  EKG is not ordered today.   Recent Labs: 05/29/2018: NT-Pro BNP 495 07/18/2018: BUN 14; Creatinine, Ser 0.86; Hemoglobin 10.2; Platelets 188; Potassium 3.9; Sodium 139  Recent Lipid Panel No results found for: CHOL, TRIG, HDL, CHOLHDL, VLDL, LDLCALC, LDLDIRECT  Physical Exam:    VS:  BP 112/60   Pulse 61   Ht 4\' 11"  (1.499 m)   Wt 117 lb 9.6 oz (53.3 kg)   SpO2 90%   BMI 23.75 kg/m     Wt Readings from Last 3 Encounters:  10/17/18 117 lb 9.6 oz (53.3 kg)  08/29/18 117 lb 11.6 oz (53.4 kg)  08/08/18 115 lb 12.8 oz (52.5 kg)     GEN: Patient is stated age well  nourished, well developed in no acute distress HEENT: Normal NECK: No JVD. LYMPHATICS: No lymphadenopathy CARDIAC: RRR, scratchy 1/6 systolic right upper sternal border murmur, no gallop, no edema. VASCULAR: 2+ bilateral carotid, radial, but absent posterior tibial bilateral pulses.  No bruits. RESPIRATORY:  Clear to auscultation without rales, wheezing or rhonchi  ABDOMEN: Soft, non-tender, non-distended, No pulsatile mass, MUSCULOSKELETAL: No deformity  SKIN: Warm and dry NEUROLOGIC:  Alert and oriented x 3 PSYCHIATRIC:  Normal affect   ASSESSMENT:    1. Coronary artery disease of native artery of native heart with stable angina pectoris (HCC)   2. Chronic diastolic CHF (congestive heart failure) (HCC)   3. Familial hypercholesterolemia   4. PAD (peripheral artery disease) (HCC)   5. Dilated aortic root (HCC)   6. Aortic stenosis, supravalvular   7. 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 Stable without angina.  Exertional dyspnea is improved and therefore I feel there was clearly a component of ischemia mediated dyspnea on the basis of diastolic dysfunction/diastolic heart failure.  She is now on adequate therapy for both the coronary disease and the diastolic dysfunction which includes beta-blocker and ACE therapy.   2. Without evidence of volume overload, diuretic therapy in the form of HCTZ appears to be keeping patient euvolemic. 3. The patient is currently on PCSK9 therapy.  No recent LDL data is available.  She is unable to tolerate statin therapy.  Lipids need to be reevaluated and have Zetia added if needed.  Target LDL is less than 70.  Because of her diagnosis hopefully we can get LDL less than 130.  May need to consider referral to specialized lipid clinic which may include adding additional therapies including. 4. Physical activity is encouraged in the form of walking  to help with control of claudication and improve exertional tolerance. 5. Will need follow-up. 6. Systolic murmur noted.  Will follow-up as required.  Consider referral to advanced lipid clinic.  I encourage aerobic activity.  Plan to  continue dual antiplatelet therapy for 12 months, becoming eligible for discontinuation of in August 2020.  Overall education and awareness concerning primary/secondary risk prevention was discussed in detail: LDL less than 70, hemoglobin A1c less than 7, blood pressure target less than 130/80 mmHg, >150 minutes of moderate aerobic activity per week, avoidance of smoking, weight control (via diet and exercise), and continued surveillance/management of/for obstructive sleep apnea.  Greater than 50% of the time during this office visit was spent in education, counseling, and coordination of care related to underlying disease process and testing as outlined.      Medication Adjustments/Labs and Tests Ordered: Current medicines are reviewed at length with the patient today.  Concerns regarding medicines are outlined above.  No orders of the defined types were placed in this encounter.  No orders of the defined types were placed in this encounter.   Patient Instructions  Medication Instructions:  Your physician recommends that you continue on your current medications as directed. Please refer to the Current Medication list given to you today.  If you need a refill on your cardiac medications before your next appointment, please call your pharmacy.   Lab work: None If you have labs (blood work) drawn today and your tests are completely normal, you will receive your results only by: [name] MyChart Message (if you have MyChart) OR . A paper copy in the mail If you have any lab test that is abnormal or we need to change your treatment, we will call you to review the results.  Testing/Procedures: None  Follow-Up: At [facility], you and your health needs are  our priority.  As part of our continuing mission to provide you with exceptional heart care, we have created designated Provider Care Teams.  These Care Teams include your primary Cardiologist (physician) and Advanced Practice Providers (APPs -  Physician Assistants and Nurse Practitioners) who all work together to provide you with the care you need, when you need it. You will need a follow up appointment in 7 months.  Please call our office 2 months in advance to schedule this appointment.  You may see Lesleigh Noe, MD or one of the following Advanced Practice Providers on your designated Care Team:   Norma Fredrickson, NP Nada Boozer, NP . Georgie Chard, NP  Any Other Special Instructions Will Be Listed Below (If Applicable).       Signed, Lesleigh Noe, MD  10/17/2018 11:58 AM    [facility]

## 2018-10-16 ENCOUNTER — Encounter (HOSPITAL_COMMUNITY)
Admission: RE | Admit: 2018-10-16 | Discharge: 2018-10-16 | Disposition: A | Discharge status: Home / Self Care | Payer: Medicare Other | Source: Ambulatory Visit | Attending: Interventional Cardiology | Admitting: Interventional Cardiology

## 2018-10-16 ENCOUNTER — Encounter (HOSPITAL_COMMUNITY): Payer: Medicare Other

## 2018-10-16 ENCOUNTER — Ambulatory Visit (HOSPITAL_COMMUNITY): Payer: Medicare Other

## 2018-10-16 DIAGNOSIS — I10 Essential (primary) hypertension: Secondary | ICD-10-CM | POA: Diagnosis not present

## 2018-10-16 DIAGNOSIS — Z955 Presence of coronary angioplasty implant and graft: Secondary | ICD-10-CM | POA: Diagnosis not present

## 2018-10-16 DIAGNOSIS — I2510 Atherosclerotic heart disease of native coronary artery without angina pectoris: Secondary | ICD-10-CM | POA: Diagnosis not present

## 2018-10-16 DIAGNOSIS — J449 Chronic obstructive pulmonary disease, unspecified: Secondary | ICD-10-CM | POA: Diagnosis not present

## 2018-10-17 ENCOUNTER — Ambulatory Visit (INDEPENDENT_AMBULATORY_CARE_PROVIDER_SITE_OTHER): Payer: Medicare Other | Admitting: Interventional Cardiology

## 2018-10-17 ENCOUNTER — Encounter: Payer: Self-pay | Admitting: Interventional Cardiology

## 2018-10-17 VITALS — BP 112/60 | HR 61 | Ht 59.0 in | Wt 117.6 lb

## 2018-10-17 DIAGNOSIS — E7801 Familial hypercholesterolemia: Secondary | ICD-10-CM

## 2018-10-17 DIAGNOSIS — Q253 Supravalvular aortic stenosis: Secondary | ICD-10-CM

## 2018-10-17 DIAGNOSIS — I25118 Atherosclerotic heart disease of native coronary artery with other forms of angina pectoris: Secondary | ICD-10-CM

## 2018-10-17 DIAGNOSIS — I739 Peripheral vascular disease, unspecified: Secondary | ICD-10-CM | POA: Diagnosis not present

## 2018-10-17 DIAGNOSIS — I5032 Chronic diastolic (congestive) heart failure: Secondary | ICD-10-CM | POA: Diagnosis not present

## 2018-10-17 DIAGNOSIS — I70211 Atherosclerosis of native arteries of extremities with intermittent claudication, right leg: Secondary | ICD-10-CM

## 2018-10-17 DIAGNOSIS — I77810 Thoracic aortic ectasia: Secondary | ICD-10-CM

## 2018-10-17 NOTE — Patient Instructions (Signed)
Medication Instructions:  Your physician recommends that you continue on your current medications as directed. Please refer to the Current Medication list given to you today.  If you need a refill on your cardiac medications before your next appointment, please call your pharmacy.   Lab work: None If you have labs (blood work) drawn today and your tests are completely normal, you will receive your results only by: [name]. MyChart Message (if you have MyChart) OR . A paper copy in the mail If you have any lab test that is abnormal or we need to change your treatment, we will call you to review the results.  Testing/Procedures: None  Follow-Up: At [facility], you and your health needs are our priority.  As part of our continuing mission to provide you with exceptional heart care, we have created designated Provider Care Teams.  These Care Teams include your primary Cardiologist (physician) and Advanced Practice Providers (APPs -  Physician Assistants and Nurse Practitioners) who all work together to provide you with the care you need, when you need it. You will need a follow up appointment in 7 months.  Please call our office 2 months in advance to schedule this appointment.  You may see Lesleigh NoeHenry W Smith III, MD or one of the following Advanced Practice Providers on your designated Care Team:   Norma FredricksonLori Gerhardt, NP Nada BoozerLaura Ingold, NP . Georgie ChardJill McDaniel, NP  Any Other Special Instructions Will Be Listed Below (If Applicable).

## 2018-10-18 ENCOUNTER — Encounter (HOSPITAL_COMMUNITY)
Admission: RE | Admit: 2018-10-18 | Discharge: 2018-10-18 | Disposition: A | Discharge status: Home / Self Care | Payer: Medicare Other | Source: Ambulatory Visit | Attending: Interventional Cardiology | Admitting: Interventional Cardiology

## 2018-10-18 ENCOUNTER — Ambulatory Visit (HOSPITAL_COMMUNITY): Payer: Medicare Other

## 2018-10-18 ENCOUNTER — Encounter (HOSPITAL_COMMUNITY): Payer: Medicare Other

## 2018-10-18 DIAGNOSIS — I2510 Atherosclerotic heart disease of native coronary artery without angina pectoris: Secondary | ICD-10-CM | POA: Diagnosis not present

## 2018-10-18 DIAGNOSIS — Z955 Presence of coronary angioplasty implant and graft: Secondary | ICD-10-CM

## 2018-10-18 DIAGNOSIS — J449 Chronic obstructive pulmonary disease, unspecified: Secondary | ICD-10-CM | POA: Diagnosis not present

## 2018-10-18 DIAGNOSIS — I10 Essential (primary) hypertension: Secondary | ICD-10-CM | POA: Diagnosis not present

## 2018-10-20 ENCOUNTER — Encounter (HOSPITAL_COMMUNITY)
Admission: RE | Admit: 2018-10-20 | Discharge: 2018-10-20 | Disposition: A | Discharge status: Home / Self Care | Payer: Medicare Other | Source: Ambulatory Visit | Attending: Interventional Cardiology | Admitting: Interventional Cardiology

## 2018-10-20 ENCOUNTER — Ambulatory Visit (HOSPITAL_COMMUNITY): Payer: Medicare Other

## 2018-10-20 ENCOUNTER — Encounter (HOSPITAL_COMMUNITY): Payer: Medicare Other

## 2018-10-20 DIAGNOSIS — J449 Chronic obstructive pulmonary disease, unspecified: Secondary | ICD-10-CM | POA: Diagnosis not present

## 2018-10-20 DIAGNOSIS — I10 Essential (primary) hypertension: Secondary | ICD-10-CM | POA: Diagnosis not present

## 2018-10-20 DIAGNOSIS — Z955 Presence of coronary angioplasty implant and graft: Secondary | ICD-10-CM | POA: Diagnosis not present

## 2018-10-20 DIAGNOSIS — I2510 Atherosclerotic heart disease of native coronary artery without angina pectoris: Secondary | ICD-10-CM | POA: Diagnosis not present

## 2018-10-23 ENCOUNTER — Encounter (HOSPITAL_COMMUNITY)
Admission: RE | Admit: 2018-10-23 | Discharge: 2018-10-23 | Disposition: A | Discharge status: Home / Self Care | Payer: Medicare Other | Source: Ambulatory Visit | Attending: Interventional Cardiology | Admitting: Interventional Cardiology

## 2018-10-23 ENCOUNTER — Encounter (HOSPITAL_COMMUNITY): Payer: Medicare Other

## 2018-10-23 ENCOUNTER — Ambulatory Visit (HOSPITAL_COMMUNITY): Payer: Medicare Other

## 2018-10-23 DIAGNOSIS — Z955 Presence of coronary angioplasty implant and graft: Secondary | ICD-10-CM

## 2018-10-23 DIAGNOSIS — I10 Essential (primary) hypertension: Secondary | ICD-10-CM | POA: Diagnosis not present

## 2018-10-23 DIAGNOSIS — I2510 Atherosclerotic heart disease of native coronary artery without angina pectoris: Secondary | ICD-10-CM | POA: Diagnosis not present

## 2018-10-23 DIAGNOSIS — J449 Chronic obstructive pulmonary disease, unspecified: Secondary | ICD-10-CM | POA: Diagnosis not present

## 2018-10-24 DIAGNOSIS — Z0000 Encounter for general adult medical examination without abnormal findings: Secondary | ICD-10-CM | POA: Diagnosis not present

## 2018-10-24 DIAGNOSIS — I739 Peripheral vascular disease, unspecified: Secondary | ICD-10-CM | POA: Diagnosis not present

## 2018-10-24 DIAGNOSIS — E201 Pseudohypoparathyroidism: Secondary | ICD-10-CM | POA: Diagnosis not present

## 2018-10-24 DIAGNOSIS — J449 Chronic obstructive pulmonary disease, unspecified: Secondary | ICD-10-CM | POA: Diagnosis not present

## 2018-10-25 ENCOUNTER — Ambulatory Visit (HOSPITAL_COMMUNITY): Payer: Medicare Other

## 2018-10-25 ENCOUNTER — Encounter (HOSPITAL_COMMUNITY): Payer: Medicare Other

## 2018-10-26 NOTE — Progress Notes (Signed)
Cardiac Individual Treatment Plan  Patient Details  Name: Sydney Gross MRN: [id number] Date of Birth: [birth year] Referring Provider:     CARDIAC REHAB PHASE II ORIENTATION from 08/29/2018 in [facility] CARDIAC REHAB  Referring Provider  [name], MD      Initial Encounter Date:    CARDIAC REHAB PHASE II ORIENTATION from 08/29/2018 in [facility] CARDIAC REHAB  Date  08/29/18      Visit Diagnosis: 07/17/18 DES x2 LAD & D1  Patient's Home Medications on Admission:  Current Outpatient Medications:  .  acetaminophen (TYLENOL) 500 MG tablet, Take 500 mg by mouth 2 (two) times daily as needed for moderate pain or headache. , Disp: , Rfl:  .  albuterol (PROVENTIL) (2.5 MG/3ML) 0.083% nebulizer solution, Take 3 mLs (2.5 mg total) by nebulization every 4 (four) hours as needed for wheezing or shortness of breath., Disp: 75 mL, Rfl: 0 .  aspirin 81 MG chewable tablet, Chew 1 tablet (81 mg total) by mouth daily., Disp: 90 tablet, Rfl: 3 .  budesonide-formoterol (SYMBICORT) 160-4.5 MCG/ACT inhaler, Inhale 2 puffs into the lungs 2 (two) times daily., Disp: , Rfl:  .  cholecalciferol (VITAMIN D) 1000 units tablet, Take 1,000 Units by mouth at bedtime., Disp: , Rfl:  .  clopidogrel (PLAVIX) 75 MG tablet, Take 1 tablet (75 mg total) by mouth daily., Disp: 90 tablet, Rfl: 3 .  Denosumab (PROLIA [place]), Inject 1 Dose as directed every 6 (six) months. Use as directed. , Disp: , Rfl:  .  diphenhydramine-acetaminophen (TYLENOL PM) 25-500 MG TABS, Take 1 tablet by mouth at bedtime as needed (pain/sleep). , Disp: , Rfl:  .  Evolocumab (REPATHA) 140 MG/ML SOSY, Inject 140 mg into the skin every 14 (fourteen) days., Disp: , Rfl:  .  lisinopril-hydrochlorothiazide (PRINZIDE,ZESTORETIC) 20-25 MG tablet, Take 1 tablet by mouth at bedtime. , Disp: , Rfl:  .  metoprolol succinate (TOPROL-XL) 25 MG 24 hr tablet, Take 0.5 tablets (12.5 mg total) by mouth daily., Disp: 45 tablet,  Rfl: 3 .  nitroGLYCERIN (NITROSTAT) 0.4 MG SL tablet, Place 1 tablet (0.4 mg total) under the tongue every 5 (five) minutes as needed for chest pain., Disp: 25 tablet, Rfl: 3  Past Medical History: Past Medical History:  Diagnosis Date  . Allergic rhinitis   . Arthritis    "maybe a little in my knees and back" (07/17/2018)  . Chronic lower back pain   . Colitis 12/2013   "never had this before"  . COPD (chronic obstructive pulmonary disease) (HCC)    "questionable/Dr. Katrinka Blazing" (07/17/2018)  . Coronary artery disease   . Exertional shortness of breath    "off and on" (07/17/2018)  . Family history of anesthesia complication    "mother; think they gave her too much; c/o pain after OR; gave her more RX; they had to bring her back real quick"  . H/O calcium pyrophosphate deposition disease (CPPD)   . Heart murmur   . Hiatal hernia   . Hyperlipidemia   . Hypertension   . Numbness in feet   . On home oxygen therapy    "I've used it 2 times in 2 yrs" (07/17/2018)  . PAD (peripheral artery disease) (HCC)   . Pneumonia    "1st grade & 4th grade" (07/17/2018)  . Proteinuria   . Shingles   . Stenosis of tear duct     Tobacco Use: Social History   Tobacco Use  Smoking Status Never Smoker  Smokeless  Tobacco Never Used    Labs: Recent Review Flowsheet Data    Labs for ITP Cardiac and Pulmonary Rehab Latest Ref Rng & Units 09/25/2008 01/19/2009 07/17/2018 07/17/2018 07/17/2018   PHART 7.350 - 7.450 - - - - 7.324(L)   PCO2ART 32.0 - 48.0 mmHg - - - - 42.1   HCO3 20.0 - 28.0 mmol/L - - 21.2 22.0 21.9   TCO2 22 - 32 mmol/L 25 27 22 23 23    ACIDBASEDEF 0.0 - 2.0 mmol/L - - 5.0(H) 5.0(H) 4.0(H)   O2SAT % - - 77.0 76.0 100.0      Capillary Blood Glucose: Lab Results  Component Value Date   GLUCAP 73 07/17/2018     Exercise Target Goals: Exercise Program Goal: Individual exercise prescription set using results from initial 6 min walk test and THRR while considering  patient's activity  barriers and safety.   Exercise Prescription Goal: Initial exercise prescription builds to 30-45 minutes a day of aerobic activity, 2-3 days per week.  Home exercise guidelines will be given to patient during program as part of exercise prescription that the participant will acknowledge.  Activity Barriers & Risk Stratification: Activity Barriers & Cardiac Risk Stratification - 08/29/18 0903      Activity Barriers & Cardiac Risk Stratification   Activity Barriers  Arthritis;Back Problems;Other (comment)    Comments  Claudication pain from PAD-bilateral. Prior back surgery-back pain.    Cardiac Risk Stratification  High       6 Minute Walk: 6 Minute Walk    Row Name 08/29/18 0856         6 Minute Walk   Phase  Initial     Distance  1000 feet     Walk Time  6 minutes     # of Rest Breaks  0     MPH  1.89     METS  1.25     RPE  15     Perceived Dyspnea   0     VO2 Peak  4.36     Symptoms  Yes (comment)     Comments  Patient c/o bilateral claudication pain, which she rated a "10/10" on the pain scale. Pt given cart at 3 minutes.     Resting HR  53 bpm     Resting BP  112/79     Resting Oxygen Saturation   98 %     Exercise Oxygen Saturation  during 6 min walk  99 %     Max Ex. HR  78 bpm     Max Ex. BP  120/80     2 Minute Post BP  104/62        Oxygen Initial Assessment:   Oxygen Re-Evaluation:   Oxygen Discharge (Final Oxygen Re-Evaluation):   Initial Exercise Prescription: Initial Exercise Prescription - 08/29/18 1200      Date of Initial Exercise RX and Referring Provider   Date  08/29/18    Referring Provider  [name], MD    Expected Discharge Date  12/04/18      Treadmill   MPH  1.5    Grade  0    Minutes  10    METs  2.15      Bike   Level  0.4    Minutes  10    METs  2.44      NuStep   Level  2    SPM  85    Minutes  10    METs  2.1      Prescription Details   Frequency (times per week)  3    Duration  Progress to 30 minutes of  continuous aerobic without signs/symptoms of physical distress      Intensity   THRR 40-80% of Max Heartrate  54-107    Ratings of Perceived Exertion  11-13    Perceived Dyspnea  0-4      Progression   Progression  Continue to progress workloads to maintain intensity without signs/symptoms of physical distress.      Resistance Training   Training Prescription  Yes    Weight  2lbs    Reps  10-15       Perform Capillary Blood Glucose checks as needed.  Exercise Prescription Changes: Exercise Prescription Changes    Row Name 09/06/18 [phone] 09/13/18 0954 09/29/18 0953 10/16/18 0952 10/23/18 0952     Response to Exercise   Blood Pressure (Admit)  108/60  110/60  100/60  110/62  110/62   Blood Pressure (Exercise)  142/78  124/72  104/60  120/64  108/78   Blood Pressure (Exit)  114/72  100/62  106/60  122/58  92/46 recheck 126/52   Heart Rate (Admit)  74 bpm  73 bpm  77 bpm  69 bpm  75 bpm   Heart Rate (Exercise)  104 bpm  97 bpm  111 bpm  109 bpm  111 bpm   Heart Rate (Exit)  67 bpm  64 bpm  77 bpm  73 bpm  78 bpm   Rating of Perceived Exertion (Exercise)  13  11  13  15  15    Symptoms  claudication  -  -  -  -   Duration  Progress to 30 minutes of  aerobic without signs/symptoms of physical distress  Progress to 30 minutes of  aerobic without signs/symptoms of physical distress  Progress to 30 minutes of  aerobic without signs/symptoms of physical distress  Progress to 30 minutes of  aerobic without signs/symptoms of physical distress  Progress to 30 minutes of  aerobic without signs/symptoms of physical distress   Intensity  THRR unchanged  THRR unchanged  THRR unchanged  THRR unchanged  THRR unchanged     Progression   Progression  Continue to progress workloads to maintain intensity without signs/symptoms of physical distress.  Continue to progress workloads to maintain intensity without signs/symptoms of physical distress.  Continue to progress workloads to maintain intensity  without signs/symptoms of physical distress.  Continue to progress workloads to maintain intensity without signs/symptoms of physical distress.  Continue to progress workloads to maintain intensity without signs/symptoms of physical distress.   Average METs  2.3  2.3  2.4  2.9  2.8     Resistance Training   Training Prescription  No Relaxation day, no weights  No Relaxation day, no weights  Yes  Yes  Yes   Weight  -  -  3lbs  3lbs  3lbs   Reps  -  -  10-15  10-15  10-15   Time  -  -  10 Minutes  10 Minutes  10 Minutes     Interval Training   Interval Training  No  No  No  No  No     Treadmill   MPH  -  -  -  -  -   Grade  -  -  -  -  -   Minutes  -  -  -  -  -  METs  -  -  -  -  -     Bike   Level  0.4  0.4  0.4  0.6  0.6   Minutes  10  10  10  10  10    METs  2.41  2.42  2.44  3.12  3.11     NuStep   Level  2  2  4  4  4    SPM  85  85  85  85  85   Minutes  10  10  10  10  10    METs  2.4  2.8  2.6  3.3  3.2     Track   Laps  7  4  6  8  7    Minutes  10  10  10  10  10    METs  2.22  1.7  2.03  2.39  2.23     Home Exercise Plan   Plans to continue exercise at  -  -  Home (comment)  Home (comment)  Home (comment)   Frequency  -  -  Add 2 additional days to program exercise sessions.  Add 2 additional days to program exercise sessions.  Add 2 additional days to program exercise sessions.   Initial Home Exercises Provided  -  -  09/18/18  09/18/18  09/18/18      Exercise Comments: Exercise Comments    Row Name 09/06/18 1100 09/13/18 1025 09/18/18 1113 09/29/18 1019 10/16/18 1010   Exercise Comments  Patient tolerated 1st session of exercise well with c/o claudication pain.  Reviewed METs with patient.  Reviewed HEP with patient. will continue to monitor and progress as tolerated.   Reviewed METs and goals with patient.  Reviewed METs with patient.   Row Name 10/23/18 1005           Exercise Comments  Reviewed goals with patient.          Exercise Goals and  Review: Exercise Goals    Row Name 08/29/18 0903             Exercise Goals   Increase Physical Activity  Yes       Intervention  Provide advice, education, support and counseling about physical activity/exercise needs.;Develop an individualized exercise prescription for aerobic and resistive training based on initial evaluation findings, risk stratification, comorbidities and participant's personal goals.       Expected Outcomes  Short Term: Attend rehab on a regular basis to increase amount of physical activity.;Long Term: Exercising regularly at least 3-5 days a week.;Long Term: Add in home exercise to make exercise part of routine and to increase amount of physical activity.       Increase Strength and Stamina  Yes       Intervention  Provide advice, education, support and counseling about physical activity/exercise needs.;Develop an individualized exercise prescription for aerobic and resistive training based on initial evaluation findings, risk stratification, comorbidities and participant's personal goals.       Expected Outcomes  Short Term: Increase workloads from initial exercise prescription for resistance, speed, and METs.;Short Term: Perform resistance training exercises routinely during rehab and add in resistance training at home;Long Term: Improve cardiorespiratory fitness, muscular endurance and strength as measured by increased METs and functional capacity ( )       Able to understand and use rate of perceived exertion (RPE) scale  Yes       Intervention  Provide education and explanation on how to use  RPE scale       Expected Outcomes  Short Term: Able to use RPE daily in rehab to express subjective intensity level;Long Term:  Able to use RPE to guide intensity level when exercising independently       Knowledge and understanding of Target Heart Rate Range (THRR)  Yes       Intervention  Provide education and explanation of THRR including how the numbers were predicted and  where they are located for reference       Expected Outcomes  Short Term: Able to state/look up THRR;Long Term: Able to use THRR to govern intensity when exercising independently;Short Term: Able to use daily as guideline for intensity in rehab       Able to check pulse independently  Yes       Intervention  Provide education and demonstration on how to check pulse in carotid and radial arteries.;Review the importance of being able to check your own pulse for safety during independent exercise       Expected Outcomes  Short Term: Able to explain why pulse checking is important during independent exercise;Long Term: Able to check pulse independently and accurately       Understanding of Exercise Prescription  Yes       Intervention  Provide education, explanation, and written materials on patient's individual exercise prescription       Expected Outcomes  Short Term: Able to explain program exercise prescription;Long Term: Able to explain home exercise prescription to exercise independently       Improve claudication pain toleration; Improve walking ability  Yes       Intervention  Attend education sessions to aid in risk factor modification and understanding of disease process       Expected Outcomes  Short Term: Improve walking distance/time to onset of claudication pain;Long Term: Improve walking ability and toleration to claudication          Exercise Goals Re-Evaluation : Exercise Goals Re-Evaluation    Row Name 09/06/18 1100 09/18/18 1111 09/29/18 1019 10/23/18 1005       Exercise Goal Re-Evaluation   Exercise Goals Review  Improve claudication pain tolerance and improve walking ability;Able to understand and use rate of perceived exertion (RPE) scale;Increase Physical Activity  Able to understand and use rate of perceived exertion (RPE) scale;Increase Physical Activity;Knowledge and understanding of Target Heart Rate Range (THRR);Understanding of Exercise Prescription;Increase Strength and  Stamina  Able to understand and use rate of perceived exertion (RPE) scale;Increase Physical Activity;Knowledge and understanding of Target Heart Rate Range (THRR);Understanding of Exercise Prescription;Increase Strength and Stamina  Able to understand and use rate of perceived exertion (RPE) scale;Increase Physical Activity;Knowledge and understanding of Target Heart Rate Range (THRR);Understanding of Exercise Prescription;Increase Strength and Stamina;Improve claudication pain tolerance and improve walking ability    Comments  Patient able to understand and use RPE scale appropriately. Pt c/o claudication pain during walking station.  Reviewed HEP with patient. Reviewed THHR, RPE, NTG use, weather conditions, and end points of exercise.   Patient states she tries to do some exercise daily. Pt does seated stretches and some walking at home.  Patient has achieved personal goal of returning to exercise at gym 3 days/week. Patient is still limited by claudication pain with walking. Pt's goal is to resume lifting 5lb weights that she was doing prior to her hospitalization.    Expected Outcomes  Increase workloads as tolerated to help improve cardiorespiratory fitness. Pt will have less pain with walking.  Patient  will continue to walk everyday for 20 minutes. Will continue to monitor and progress as tolerated.   Progress workloads as tolerated to help increase strength and stamina.  Patient will increase hand weights from 3 to 4lbs to help achieve goal of resuming lifting 5lb weights.       Discharge Exercise Prescription (Final Exercise Prescription Changes): Exercise Prescription Changes - 10/23/18 0952      Response to Exercise   Blood Pressure (Admit)  110/62    Blood Pressure (Exercise)  108/78    Blood Pressure (Exit)  92/46   recheck 126/52   Heart Rate (Admit)  75 bpm    Heart Rate (Exercise)  111 bpm    Heart Rate (Exit)  78 bpm    Rating of Perceived Exertion (Exercise)  15    Duration   Progress to 30 minutes of  aerobic without signs/symptoms of physical distress    Intensity  THRR unchanged      Progression   Progression  Continue to progress workloads to maintain intensity without signs/symptoms of physical distress.    Average METs  2.8      Resistance Training   Training Prescription  Yes    Weight  3lbs    Reps  10-15    Time  10 Minutes      Interval Training   Interval Training  No      Bike   Level  0.6    Minutes  10    METs  3.11      NuStep   Level  4    SPM  85    Minutes  10    METs  3.2      Track   Laps  7    Minutes  10    METs  2.23      Home Exercise Plan   Plans to continue exercise at  Home (comment)    Frequency  Add 2 additional days to program exercise sessions.    Initial Home Exercises Provided  09/18/18       Nutrition:  Target Goals: Understanding of nutrition guidelines, daily intake of sodium 1500mg , cholesterol 200mg , calories 30% from fat and 7% or less from saturated fats, daily to have 5 or more servings of fruits and vegetables.  Biometrics: Pre Biometrics - 08/29/18 1249      Pre Biometrics   Height  4\' 11"  (1.499 m)    Weight  53.4 kg    Waist Circumference  31 inches    Hip Circumference  37.25 inches    Waist to Hip Ratio  0.83 %    BMI (Calculated)  23.77    Triceps Skinfold  23.5 mm    % Body Fat  36.5 %    Grip Strength  13.5 kg    Flexibility  11.25 in    Single Leg Stand  18.28 seconds        Nutrition Therapy Plan and Nutrition Goals: Nutrition Therapy & Goals - 08/29/18 1001      Nutrition Therapy   Diet  heart healthy      Personal Nutrition Goals   Nutrition Goal  Pt to identify food quantities necessary to achieve weight maintenance or weight gain of 4-5 lbs lbs. at graduation from cardiac rehab.     Personal Goal #2  Pt to identify and limit food sources of saturated fat, trans fat, refined carbohydrates and sodium      Intervention Plan   Intervention  Prescribe, educate and  counsel regarding individualized specific dietary modifications aiming towards targeted core components such as weight, hypertension, lipid management, diabetes, heart failure and other comorbidities.    Expected Outcomes  Short Term Goal: Understand basic principles of dietary content, such as calories, fat, sodium, cholesterol and nutrients.;Long Term Goal: Adherence to prescribed nutrition plan.       Nutrition Assessments: Nutrition Assessments - 08/29/18 1002      MEDFICTS Scores   Pre Score  21       Nutrition Goals Re-Evaluation: Nutrition Goals Re-Evaluation    Row Name 08/29/18 1001             Goals   Current Weight  117 lb 11.6 oz (53.4 kg)          Nutrition Goals Re-Evaluation: Nutrition Goals Re-Evaluation    Row Name 08/29/18 1001             Goals   Current Weight  117 lb 11.6 oz (53.4 kg)          Nutrition Goals Discharge (Final Nutrition Goals Re-Evaluation): Nutrition Goals Re-Evaluation - 08/29/18 1001      Goals   Current Weight  117 lb 11.6 oz (53.4 kg)       Psychosocial: Target Goals: Acknowledge presence or absence of significant depression and/or stress, maximize coping skills, provide positive support system. Participant is able to verbalize types and ability to use techniques and skills needed for reducing stress and depression.  Initial Review & Psychosocial Screening: Initial Psych Review & Screening - 08/29/18 1026      Initial Review   Current issues with  None Identified      Family Dynamics   Good Support System?  Yes   husband and Dennard Nip, daughter      Barriers   Psychosocial barriers to participate in program  There are no identifiable barriers or psychosocial needs.      Screening Interventions   Interventions  Encouraged to exercise       Quality of Life Scores: Quality of Life - 08/29/18 1027      Quality of Life   Select  Quality of Life      Quality of Life Scores   Health/Function Pre  23.13 %     Socioeconomic Pre  27.75 %    Psych/Spiritual Pre  26.57 %    Family Pre  30 %    GLOBAL Pre  25.96 %      Scores of 19 and below usually indicate a poorer quality of life in these areas.  A difference of  2-3 points is a clinically meaningful difference.  A difference of 2-3 points in the total score of the Quality of Life Index has been associated with significant improvement in overall quality of life, self-image, physical symptoms, and general health in studies assessing change in quality of life.  PHQ-9: Recent Review Flowsheet Data    Depression screen [facility] 2/9 09/06/2018 12/25/2013   Decreased Interest 0 0   Down, Depressed, Hopeless 0 0   PHQ - 2 Score 0 0     Interpretation of Total Score  Total Score Depression Severity:  1-4 = Minimal depression, 5-9 = Mild depression, 10-14 = Moderate depression, 15-19 = Moderately severe depression, 20-27 = Severe depression   Psychosocial Evaluation and Intervention:   Psychosocial Re-Evaluation: Psychosocial Re-Evaluation    Row Name 10/04/18 1636 10/26/18 0836           Psychosocial Re-Evaluation  Current issues with  Current Stress Concerns  Current Stress Concerns      Comments  Pat's daughter has MS. Will continue to provide emotional support as needed  Dennie Bible says her daughter is in a better state of health and is getting around better      Interventions  Stress management education  Stress management education      Continue Psychosocial Services   Follow up required by staff  Follow up required by staff        Initial Review   Source of Stress Concerns  Family  Family         Psychosocial Discharge (Final Psychosocial Re-Evaluation): Psychosocial Re-Evaluation - 10/26/18 0836      Psychosocial Re-Evaluation   Current issues with  Current Stress Concerns    Comments  Dennie Bible says her daughter is in a better state of health and is getting around better    Interventions  Stress management education    Continue Psychosocial  Services   Follow up required by staff      Initial Review   Source of Stress Concerns  Family       Vocational Rehabilitation: Provide vocational rehab assistance to qualifying candidates.   Vocational Rehab Evaluation & Intervention: Vocational Rehab - 08/29/18 1027      Initial Vocational Rehab Evaluation & Intervention   Assessment shows need for Vocational Rehabilitation  No   retired       Education: Education Goals: Education classes will be provided on a weekly basis, covering required topics. Participant will state understanding/return demonstration of topics presented.  Learning Barriers/Preferences: Learning Barriers/Preferences - 08/29/18 1216      Learning Barriers/Preferences   Learning Barriers  Sight    Learning Preferences  Written Material;Video       Education Topics: Count Your Pulse:  -Group instruction provided by verbal instruction, demonstration, patient participation and written materials to support subject.  Instructors address importance of being able to find your pulse and how to count your pulse when at home without a heart monitor.  Patients get hands on experience counting their pulse with staff help and individually.   Heart Attack, Angina, and Risk Factor Modification:  -Group instruction provided by verbal instruction, video, and written materials to support subject.  Instructors address signs and symptoms of angina and heart attacks.    Also discuss risk factors for heart disease and how to make changes to improve heart health risk factors.   Functional Fitness:  -Group instruction provided by verbal instruction, demonstration, patient participation, and written materials to support subject.  Instructors address safety measures for doing things around the house.  Discuss how to get up and down off the floor, how to pick things up properly, how to safely get out of a chair without assistance, and balance training.   Meditation and  Mindfulness:  -Group instruction provided by verbal instruction, patient participation, and written materials to support subject.  Instructor addresses importance of mindfulness and meditation practice to help reduce stress and improve awareness.  Instructor also leads participants through a meditation exercise.    Stretching for Flexibility and Mobility:  -Group instruction provided by verbal instruction, patient participation, and written materials to support subject.  Instructors lead participants through series of stretches that are designed to increase flexibility thus improving mobility.  These stretches are additional exercise for major muscle groups that are typically performed during regular warm up and cool down.   Hands Only CPR:  -Group verbal, video, and  participation provides a basic overview of AHA guidelines for community CPR. Role-play of emergencies allow participants the opportunity to practice calling for help and chest compression technique with discussion of AED use.   Hypertension: -Group verbal and written instruction that provides a basic overview of hypertension including the most recent diagnostic guidelines, risk factor reduction with self-care instructions and medication management.    Nutrition I class: Heart Healthy Eating:  -Group instruction provided by PowerPoint slides, verbal discussion, and written materials to support subject matter. The instructor gives an explanation and review of the Therapeutic Lifestyle Changes diet recommendations, which includes a discussion on lipid goals, dietary fat, sodium, fiber, plant stanol/sterol esters, sugar, and the components of a well-balanced, healthy diet.   Nutrition II class: Lifestyle Skills:  -Group instruction provided by PowerPoint slides, verbal discussion, and written materials to support subject matter. The instructor gives an explanation and review of label reading, grocery shopping for heart health, heart  healthy recipe modifications, and ways to make healthier choices when eating out.   Diabetes Question & Answer:  -Group instruction provided by PowerPoint slides, verbal discussion, and written materials to support subject matter. The instructor gives an explanation and review of diabetes co-morbidities, pre- and post-prandial blood glucose goals, pre-exercise blood glucose goals, signs, symptoms, and treatment of hypoglycemia and hyperglycemia, and foot care basics.   Diabetes Blitz:  -Group instruction provided by PowerPoint slides, verbal discussion, and written materials to support subject matter. The instructor gives an explanation and review of the physiology behind type 1 and type 2 diabetes, diabetes medications and rational behind using different medications, pre- and post-prandial blood glucose recommendations and Hemoglobin A1c goals, diabetes diet, and exercise including blood glucose guidelines for exercising safely.    Portion Distortion:  -Group instruction provided by PowerPoint slides, verbal discussion, written materials, and food models to support subject matter. The instructor gives an explanation of serving size versus portion size, changes in portions sizes over the last 20 years, and what consists of a serving from each food group.   Stress Management:  -Group instruction provided by verbal instruction, video, and written materials to support subject matter.  Instructors review role of stress in heart disease and how to cope with stress positively.     Exercising on Your Own:  -Group instruction provided by verbal instruction, power point, and written materials to support subject.  Instructors discuss benefits of exercise, components of exercise, frequency and intensity of exercise, and end points for exercise.  Also discuss use of nitroglycerin and activating EMS.  Review options of places to exercise outside of rehab.  Review guidelines for sex with heart  disease.   Cardiac Drugs I:  -Group instruction provided by verbal instruction and written materials to support subject.  Instructor reviews cardiac drug classes: antiplatelets, anticoagulants, beta blockers, and statins.  Instructor discusses reasons, side effects, and lifestyle considerations for each drug class.   Cardiac Drugs II:  -Group instruction provided by verbal instruction and written materials to support subject.  Instructor reviews cardiac drug classes: angiotensin converting enzyme inhibitors (ACE-I), angiotensin II receptor blockers (ARBs), nitrates, and calcium channel blockers.  Instructor discusses reasons, side effects, and lifestyle considerations for each drug class.   Anatomy and Physiology of the Circulatory System:  Group verbal and written instruction and models provide basic cardiac anatomy and physiology, with the coronary electrical and arterial systems. Review of: AMI, Angina, Valve disease, Heart Failure, Peripheral Artery Disease, Cardiac Arrhythmia, Pacemakers, and the ICD.   Other Education:  -  Group or individual verbal, written, or video instructions that support the educational goals of the cardiac rehab program.   Holiday Eating Survival Tips:  -Group instruction provided by PowerPoint slides, verbal discussion, and written materials to support subject matter. The instructor gives patients tips, tricks, and techniques to help them not only survive but enjoy the holidays despite the onslaught of food that accompanies the holidays.   Knowledge Questionnaire Score: Knowledge Questionnaire Score - 08/29/18 1027      Knowledge Questionnaire Score   Pre Score  18/24       Core Components/Risk Factors/Patient Goals at Admission: Personal Goals and Risk Factors at Admission - 08/29/18 1214      Core Components/Risk Factors/Patient Goals on Admission   Hypertension  Yes    Intervention  Provide education on lifestyle modifcations including regular  physical activity/exercise, weight management, moderate sodium restriction and increased consumption of fresh fruit, vegetables, and low fat dairy, alcohol moderation, and smoking cessation.;Monitor prescription use compliance.    Expected Outcomes  Short Term: Continued assessment and intervention until BP is < 140/60mm HG in hypertensive participants. < 130/83mm HG in hypertensive participants with diabetes, heart failure or chronic kidney disease.;Long Term: Maintenance of blood pressure at goal levels.    Lipids  Yes    Intervention  Provide education and support for participant on nutrition & aerobic/resistive exercise along with prescribed medications to achieve LDL 70mg , HDL >40mg .    Expected Outcomes  Short Term: Participant states understanding of desired cholesterol values and is compliant with medications prescribed. Participant is following exercise prescription and nutrition guidelines.;Long Term: Cholesterol controlled with medications as prescribed, with individualized exercise RX and with personalized nutrition plan. Value goals: LDL < 70mg , HDL > 40 mg.       Core Components/Risk Factors/Patient Goals Review:  Goals and Risk Factor Review    Row Name 09/07/18 1054 10/04/18 1637 10/26/18 0839         Core Components/Risk Factors/Patient Goals Review   Personal Goals Review  Hypertension;Lipids  Hypertension;Lipids;Stress  Hypertension;Lipids;Stress     Review  09/06/18 was Pat's first days of exercise at cardiac rehab  Pat's vital sign have been stable. Dennie Bible is concerned about her daughter health  Pat's vital sign have been stable. Dennie Bible is doing okay with exercise and takes breaks on the walking track due to claudication     Expected Outcomes  Patient will participate in phase 2 cardiac rehab. Follow exercise, nutrition and lifestyle modfication opportunities.  Patient will participate in phase 2 cardiac rehab. Follow exercise, nutrition and lifestyle modfication opportunities.   Patient will participate in phase 2 cardiac rehab. Follow exercise, nutrition and lifestyle modfication opportunities.        Core Components/Risk Factors/Patient Goals at Discharge (Final Review):  Goals and Risk Factor Review - 10/26/18 0839      Core Components/Risk Factors/Patient Goals Review   Personal Goals Review  Hypertension;Lipids;Stress    Review  Pat's vital sign have been stable. Dennie Bible is doing okay with exercise and takes breaks on the walking track due to claudication    Expected Outcomes  Patient will participate in phase 2 cardiac rehab. Follow exercise, nutrition and lifestyle modfication opportunities.       ITP Comments: ITP Comments    Row Name 08/29/18 0909 09/07/18 1056 10/04/18 1633 10/26/18 0836     ITP Comments  Dr. Armanda Magic, Medical Director   30 Day ITP review. 09/06/18 was Pat's first day of exercise  30 Day ITP Review.  Patient with good participation despite having PAD  30 Day ITP Review. Patient with good participation despite having PAD       Comments: See ITP comments.Gladstone Lighter, RN,BSN 10/26/2018 8:43 AM

## 2018-10-27 ENCOUNTER — Ambulatory Visit (HOSPITAL_COMMUNITY): Payer: Medicare Other

## 2018-10-27 ENCOUNTER — Encounter (HOSPITAL_COMMUNITY)
Admission: RE | Admit: 2018-10-27 | Discharge: 2018-10-27 | Disposition: A | Discharge status: Home / Self Care | Payer: Medicare Other | Source: Ambulatory Visit | Attending: Interventional Cardiology | Admitting: Interventional Cardiology

## 2018-10-27 ENCOUNTER — Encounter (HOSPITAL_COMMUNITY): Payer: Medicare Other

## 2018-10-27 DIAGNOSIS — Z955 Presence of coronary angioplasty implant and graft: Secondary | ICD-10-CM

## 2018-10-27 DIAGNOSIS — I2510 Atherosclerotic heart disease of native coronary artery without angina pectoris: Secondary | ICD-10-CM | POA: Diagnosis not present

## 2018-10-27 DIAGNOSIS — I10 Essential (primary) hypertension: Secondary | ICD-10-CM | POA: Diagnosis not present

## 2018-10-27 DIAGNOSIS — J449 Chronic obstructive pulmonary disease, unspecified: Secondary | ICD-10-CM | POA: Diagnosis not present

## 2018-10-30 ENCOUNTER — Encounter (HOSPITAL_COMMUNITY): Payer: Medicare Other

## 2018-10-30 ENCOUNTER — Ambulatory Visit (HOSPITAL_COMMUNITY): Payer: Medicare Other

## 2018-10-30 ENCOUNTER — Encounter (HOSPITAL_COMMUNITY)
Admission: RE | Admit: 2018-10-30 | Discharge: 2018-10-30 | Disposition: A | Discharge status: Home / Self Care | Payer: Medicare Other | Source: Ambulatory Visit | Attending: Interventional Cardiology | Admitting: Interventional Cardiology

## 2018-10-30 DIAGNOSIS — J449 Chronic obstructive pulmonary disease, unspecified: Secondary | ICD-10-CM | POA: Diagnosis not present

## 2018-10-30 DIAGNOSIS — Z955 Presence of coronary angioplasty implant and graft: Secondary | ICD-10-CM

## 2018-10-30 DIAGNOSIS — I2510 Atherosclerotic heart disease of native coronary artery without angina pectoris: Secondary | ICD-10-CM | POA: Diagnosis not present

## 2018-10-30 DIAGNOSIS — I10 Essential (primary) hypertension: Secondary | ICD-10-CM | POA: Diagnosis not present

## 2018-11-01 ENCOUNTER — Encounter (HOSPITAL_COMMUNITY): Payer: Medicare Other

## 2018-11-01 ENCOUNTER — Ambulatory Visit (HOSPITAL_COMMUNITY): Payer: Medicare Other

## 2018-11-03 ENCOUNTER — Encounter (HOSPITAL_COMMUNITY): Payer: Medicare Other

## 2018-11-03 ENCOUNTER — Ambulatory Visit (HOSPITAL_COMMUNITY): Payer: Medicare Other

## 2018-11-06 ENCOUNTER — Ambulatory Visit (HOSPITAL_COMMUNITY): Payer: Medicare Other

## 2018-11-06 ENCOUNTER — Encounter (HOSPITAL_COMMUNITY)
Admission: RE | Admit: 2018-11-06 | Discharge: 2018-11-06 | Disposition: A | Discharge status: Home / Self Care | Payer: Medicare Other | Source: Ambulatory Visit | Attending: Interventional Cardiology | Admitting: Interventional Cardiology

## 2018-11-06 ENCOUNTER — Encounter (HOSPITAL_COMMUNITY): Payer: Medicare Other

## 2018-11-06 DIAGNOSIS — J449 Chronic obstructive pulmonary disease, unspecified: Secondary | ICD-10-CM | POA: Insufficient documentation

## 2018-11-06 DIAGNOSIS — Z955 Presence of coronary angioplasty implant and graft: Secondary | ICD-10-CM

## 2018-11-06 DIAGNOSIS — I10 Essential (primary) hypertension: Secondary | ICD-10-CM | POA: Diagnosis not present

## 2018-11-06 DIAGNOSIS — I2510 Atherosclerotic heart disease of native coronary artery without angina pectoris: Secondary | ICD-10-CM | POA: Insufficient documentation

## 2018-11-08 ENCOUNTER — Encounter (HOSPITAL_COMMUNITY): Payer: Medicare Other

## 2018-11-08 ENCOUNTER — Encounter (HOSPITAL_COMMUNITY)
Admission: RE | Admit: 2018-11-08 | Discharge: 2018-11-08 | Disposition: A | Discharge status: Home / Self Care | Payer: Medicare Other | Source: Ambulatory Visit | Attending: Interventional Cardiology | Admitting: Interventional Cardiology

## 2018-11-08 ENCOUNTER — Ambulatory Visit (HOSPITAL_COMMUNITY): Payer: Medicare Other

## 2018-11-08 DIAGNOSIS — Z955 Presence of coronary angioplasty implant and graft: Secondary | ICD-10-CM

## 2018-11-08 DIAGNOSIS — I2510 Atherosclerotic heart disease of native coronary artery without angina pectoris: Secondary | ICD-10-CM | POA: Diagnosis not present

## 2018-11-08 DIAGNOSIS — I10 Essential (primary) hypertension: Secondary | ICD-10-CM | POA: Diagnosis not present

## 2018-11-08 DIAGNOSIS — J449 Chronic obstructive pulmonary disease, unspecified: Secondary | ICD-10-CM | POA: Diagnosis not present

## 2018-11-10 ENCOUNTER — Ambulatory Visit (HOSPITAL_COMMUNITY): Payer: Medicare Other

## 2018-11-10 ENCOUNTER — Encounter (HOSPITAL_COMMUNITY): Payer: Medicare Other

## 2018-11-10 ENCOUNTER — Encounter (HOSPITAL_COMMUNITY)
Admission: RE | Admit: 2018-11-10 | Discharge: 2018-11-10 | Disposition: A | Discharge status: Home / Self Care | Payer: Medicare Other | Source: Ambulatory Visit | Attending: Interventional Cardiology | Admitting: Interventional Cardiology

## 2018-11-10 DIAGNOSIS — I2510 Atherosclerotic heart disease of native coronary artery without angina pectoris: Secondary | ICD-10-CM | POA: Diagnosis not present

## 2018-11-10 DIAGNOSIS — J449 Chronic obstructive pulmonary disease, unspecified: Secondary | ICD-10-CM | POA: Diagnosis not present

## 2018-11-10 DIAGNOSIS — Z955 Presence of coronary angioplasty implant and graft: Secondary | ICD-10-CM | POA: Diagnosis not present

## 2018-11-10 DIAGNOSIS — I10 Essential (primary) hypertension: Secondary | ICD-10-CM | POA: Diagnosis not present

## 2018-11-13 ENCOUNTER — Ambulatory Visit (HOSPITAL_COMMUNITY): Payer: Medicare Other

## 2018-11-13 ENCOUNTER — Encounter (HOSPITAL_COMMUNITY)
Admission: RE | Admit: 2018-11-13 | Discharge: 2018-11-13 | Disposition: A | Discharge status: Home / Self Care | Payer: Medicare Other | Source: Ambulatory Visit | Attending: Interventional Cardiology | Admitting: Interventional Cardiology

## 2018-11-13 ENCOUNTER — Encounter (HOSPITAL_COMMUNITY): Payer: Medicare Other

## 2018-11-13 DIAGNOSIS — Z955 Presence of coronary angioplasty implant and graft: Secondary | ICD-10-CM

## 2018-11-13 DIAGNOSIS — J449 Chronic obstructive pulmonary disease, unspecified: Secondary | ICD-10-CM | POA: Diagnosis not present

## 2018-11-13 DIAGNOSIS — I10 Essential (primary) hypertension: Secondary | ICD-10-CM | POA: Diagnosis not present

## 2018-11-13 DIAGNOSIS — I2510 Atherosclerotic heart disease of native coronary artery without angina pectoris: Secondary | ICD-10-CM | POA: Diagnosis not present

## 2018-11-14 NOTE — Progress Notes (Signed)
Cardiac Individual Treatment Plan  Patient Details  Name: Sydney Gross MRN: [id number] Date of Birth: Feb 08, 1930 Referring Provider:     CARDIAC REHAB PHASE II ORIENTATION from 08/29/2018 in [facility] CARDIAC REHAB  Referring Provider  [name], MD      Initial Encounter Date:    CARDIAC REHAB PHASE II ORIENTATION from 08/29/2018 in [facility] CARDIAC REHAB  Date  08/29/18      Visit Diagnosis: 07/17/18 DES x2 LAD & D1  Patient's Home Medications on Admission:  Current Outpatient Medications:  .  acetaminophen (TYLENOL) 500 MG tablet, Take 500 mg by mouth 2 (two) times daily as needed for moderate pain or headache. , Disp: , Rfl:  .  albuterol (PROVENTIL) (2.5 MG/3ML) 0.083% nebulizer solution, Take 3 mLs (2.5 mg total) by nebulization every 4 (four) hours as needed for wheezing or shortness of breath., Disp: 75 mL, Rfl: 0 .  aspirin 81 MG chewable tablet, Chew 1 tablet (81 mg total) by mouth daily., Disp: 90 tablet, Rfl: 3 .  budesonide-formoterol (SYMBICORT) 160-4.5 MCG/ACT inhaler, Inhale 2 puffs into the lungs 2 (two) times daily., Disp: , Rfl:  .  cholecalciferol (VITAMIN D) 1000 units tablet, Take 1,000 Units by mouth at bedtime., Disp: , Rfl:  .  clopidogrel (PLAVIX) 75 MG tablet, Take 1 tablet (75 mg total) by mouth daily., Disp: 90 tablet, Rfl: 3 .  Denosumab (PROLIA [place]), Inject 1 Dose as directed every 6 (six) months. Use as directed. , Disp: , Rfl:  .  diphenhydramine-acetaminophen (TYLENOL PM) 25-500 MG TABS, Take 1 tablet by mouth at bedtime as needed (pain/sleep). , Disp: , Rfl:  .  Evolocumab (REPATHA) 140 MG/ML SOSY, Inject 140 mg into the skin every 14 (fourteen) days., Disp: , Rfl:  .  lisinopril-hydrochlorothiazide (PRINZIDE,ZESTORETIC) 20-25 MG tablet, Take 1 tablet by mouth at bedtime. , Disp: , Rfl:  .  metoprolol succinate (TOPROL-XL) 25 MG 24 hr tablet, Take 0.5 tablets (12.5 mg total) by mouth daily., Disp: 45 tablet,  Rfl: 3 .  nitroGLYCERIN (NITROSTAT) 0.4 MG SL tablet, Place 1 tablet (0.4 mg total) under the tongue every 5 (five) minutes as needed for chest pain., Disp: 25 tablet, Rfl: 3  Past Medical History: Past Medical History:  Diagnosis Date  . Allergic rhinitis   . Arthritis    "maybe a little in my knees and back" (07/17/2018)  . Chronic lower back pain   . Colitis 12/2013   "never had this before"  . COPD (chronic obstructive pulmonary disease) (HCC)    "questionable/Dr. Katrinka Blazing" (07/17/2018)  . Coronary artery disease   . Exertional shortness of breath    "off and on" (07/17/2018)  . Family history of anesthesia complication    "mother; think they gave her too much; c/o pain after OR; gave her more RX; they had to bring her back real quick"  . H/O calcium pyrophosphate deposition disease (CPPD)   . Heart murmur   . Hiatal hernia   . Hyperlipidemia   . Hypertension   . Numbness in feet   . On home oxygen therapy    "I've used it 2 times in 2 yrs" (07/17/2018)  . PAD (peripheral artery disease) (HCC)   . Pneumonia    "1st grade & 4th grade" (07/17/2018)  . Proteinuria   . Shingles   . Stenosis of tear duct     Tobacco Use: Social History   Tobacco Use  Smoking Status Never Smoker  Smokeless  Tobacco Never Used    Labs: Recent Review Flowsheet Data    Labs for ITP Cardiac and Pulmonary Rehab Latest Ref Rng & Units 09/25/2008 01/19/2009 07/17/2018 07/17/2018 07/17/2018   PHART 7.350 - 7.450 - - - - 7.324(L)   PCO2ART 32.0 - 48.0 mmHg - - - - 42.1   HCO3 20.0 - 28.0 mmol/L - - 21.2 22.0 21.9   TCO2 22 - 32 mmol/L 25 27 22 23 23    ACIDBASEDEF 0.0 - 2.0 mmol/L - - 5.0(H) 5.0(H) 4.0(H)   O2SAT % - - 77.0 76.0 100.0      Capillary Blood Glucose: Lab Results  Component Value Date   GLUCAP 73 07/17/2018     Exercise Target Goals: Exercise Program Goal: Individual exercise prescription set using results from initial 6 min walk test and THRR while considering  patient's activity  barriers and safety.   Exercise Prescription Goal: Initial exercise prescription builds to 30-45 minutes a day of aerobic activity, 2-3 days per week.  Home exercise guidelines will be given to patient during program as part of exercise prescription that the participant will acknowledge.  Activity Barriers & Risk Stratification: Activity Barriers & Cardiac Risk Stratification - 08/29/18 0903      Activity Barriers & Cardiac Risk Stratification   Activity Barriers  Arthritis;Back Problems;Other (comment)    Comments  Claudication pain from PAD-bilateral. Prior back surgery-back pain.    Cardiac Risk Stratification  High       6 Minute Walk: 6 Minute Walk    Row Name 08/29/18 0856         6 Minute Walk   Phase  Initial     Distance  1000 feet     Walk Time  6 minutes     # of Rest Breaks  0     MPH  1.89     METS  1.25     RPE  15     Perceived Dyspnea   0     VO2 Peak  4.36     Symptoms  Yes (comment)     Comments  Patient c/o bilateral claudication pain, which she rated a "10/10" on the pain scale. Pt given cart at 3 minutes.     Resting HR  53 bpm     Resting BP  112/79     Resting Oxygen Saturation   98 %     Exercise Oxygen Saturation  during 6 min walk  99 %     Max Ex. HR  78 bpm     Max Ex. BP  120/80     2 Minute Post BP  104/62        Oxygen Initial Assessment:   Oxygen Re-Evaluation:   Oxygen Discharge (Final Oxygen Re-Evaluation):   Initial Exercise Prescription: Initial Exercise Prescription - 08/29/18 1200      Date of Initial Exercise RX and Referring Provider   Date  08/29/18    Referring Provider  [name], MD    Expected Discharge Date  12/04/18      Treadmill   MPH  1.5    Grade  0    Minutes  10    METs  2.15      Bike   Level  0.4    Minutes  10    METs  2.44      NuStep   Level  2    SPM  85    Minutes  10    METs  2.1      Prescription Details   Frequency (times per week)  3    Duration  Progress to 30 minutes of  continuous aerobic without signs/symptoms of physical distress      Intensity   THRR 40-80% of Max Heartrate  54-107    Ratings of Perceived Exertion  11-13    Perceived Dyspnea  0-4      Progression   Progression  Continue to progress workloads to maintain intensity without signs/symptoms of physical distress.      Resistance Training   Training Prescription  Yes    Weight  2lbs    Reps  10-15       Perform Capillary Blood Glucose checks as needed.  Exercise Prescription Changes:  Exercise Prescription Changes    Row Name 09/06/18 [phone] 09/13/18 0954 09/29/18 0953 10/16/18 0952 10/23/18 0952     Response to Exercise   Blood Pressure (Admit)  108/60  110/60  100/60  110/62  110/62   Blood Pressure (Exercise)  142/78  124/72  104/60  120/64  108/78   Blood Pressure (Exit)  114/72  100/62  106/60  122/58  92/46 recheck 126/52   Heart Rate (Admit)  74 bpm  73 bpm  77 bpm  69 bpm  75 bpm   Heart Rate (Exercise)  104 bpm  97 bpm  111 bpm  109 bpm  111 bpm   Heart Rate (Exit)  67 bpm  64 bpm  77 bpm  73 bpm  78 bpm   Rating of Perceived Exertion (Exercise)  13  11  13  15  15    Symptoms  claudication  -  -  -  -   Duration  Progress to 30 minutes of  aerobic without signs/symptoms of physical distress  Progress to 30 minutes of  aerobic without signs/symptoms of physical distress  Progress to 30 minutes of  aerobic without signs/symptoms of physical distress  Progress to 30 minutes of  aerobic without signs/symptoms of physical distress  Progress to 30 minutes of  aerobic without signs/symptoms of physical distress   Intensity  THRR unchanged  THRR unchanged  THRR unchanged  THRR unchanged  THRR unchanged     Progression   Progression  Continue to progress workloads to maintain intensity without signs/symptoms of physical distress.  Continue to progress workloads to maintain intensity without signs/symptoms of physical distress.  Continue to progress workloads to maintain intensity  without signs/symptoms of physical distress.  Continue to progress workloads to maintain intensity without signs/symptoms of physical distress.  Continue to progress workloads to maintain intensity without signs/symptoms of physical distress.   Average METs  2.3  2.3  2.4  2.9  2.8     Resistance Training   Training Prescription  No Relaxation day, no weights  No Relaxation day, no weights  Yes  Yes  Yes   Weight  -  -  3lbs  3lbs  3lbs   Reps  -  -  10-15  10-15  10-15   Time  -  -  10 Minutes  10 Minutes  10 Minutes     Interval Training   Interval Training  No  No  No  No  No     Treadmill   MPH  -  -  -  -  -   Grade  -  -  -  -  -   Minutes  -  -  -  -  -  METs  -  -  -  -  -     Bike   Level  0.4  0.4  0.4  0.6  0.6   Minutes  10  10  10  10  10    METs  2.41  2.42  2.44  3.12  3.11     NuStep   Level  2  2  4  4  4    SPM  85  85  85  85  85   Minutes  10  10  10  10  10    METs  2.4  2.8  2.6  3.3  3.2     Track   Laps  7  4  6  8  7    Minutes  10  10  10  10  10    METs  2.22  1.7  2.03  2.39  2.23     Home Exercise Plan   Plans to continue exercise at  -  -  Home (comment)  Home (comment)  Home (comment)   Frequency  -  -  Add 2 additional days to program exercise sessions.  Add 2 additional days to program exercise sessions.  Add 2 additional days to program exercise sessions.   Initial Home Exercises Provided  -  -  09/18/18  09/18/18  09/18/18   Row Name 11/06/18 0955             Response to Exercise   Blood Pressure (Admit)  102/60       Blood Pressure (Exercise)  110/62       Blood Pressure (Exit)  102/60       Heart Rate (Admit)  80 bpm       Heart Rate (Exercise)  107 bpm       Heart Rate (Exit)  80 bpm       Rating of Perceived Exertion (Exercise)  14       Symptoms  claudication, bilateral       Comments  switched from bike to rower       Duration  Progress to 30 minutes of  aerobic without signs/symptoms of physical distress       Intensity   THRR unchanged         Progression   Progression  Continue to progress workloads to maintain intensity without signs/symptoms of physical distress.       Average METs  3.5         Resistance Training   Training Prescription  Yes       Weight  3lbs       Reps  10-15       Time  10 Minutes         Interval Training   Interval Training  No         Bike   Level  -       Minutes  -       METs  -         NuStep   Level  4       SPM  85       Minutes  10       METs  3.7         Rower   Level  3       Watts  14       Minutes  10       METs  4.54  Track   Laps  7       Minutes  10       METs  2.23         Home Exercise Plan   Plans to continue exercise at  Home (comment)       Frequency  Add 2 additional days to program exercise sessions.       Initial Home Exercises Provided  09/18/18          Exercise Comments:  Exercise Comments    Row Name 09/06/18 1100 09/13/18 1025 09/18/18 1113 09/29/18 1019 10/16/18 1010   Exercise Comments  Patient tolerated 1st session of exercise well with c/o claudication pain.  Reviewed METs with patient.  Reviewed HEP with patient. will continue to monitor and progress as tolerated.   Reviewed METs and goals with patient.  Reviewed METs with patient.   Row Name 10/23/18 1005 11/06/18 1035         Exercise Comments  Reviewed goals with patient.  Reviewed METs with patient. Switched from bike to rower and tolerated well. Pt [facility]         Exercise Goals and Review:  Exercise Goals    Row Name 08/29/18 0903             Exercise Goals   Increase Physical Activity  Yes       Intervention  Provide advice, education, support and counseling about physical activity/exercise needs.;Develop an individualized exercise prescription for aerobic and resistive training based on initial evaluation findings, risk stratification, comorbidities and participant's personal goals.       Expected  Outcomes  Short Term: Attend rehab on a regular basis to increase amount of physical activity.;Long Term: Exercising regularly at least 3-5 days a week.;Long Term: Add in home exercise to make exercise part of routine and to increase amount of physical activity.       Increase Strength and Stamina  Yes       Intervention  Provide advice, education, support and counseling about physical activity/exercise needs.;Develop an individualized exercise prescription for aerobic and resistive training based on initial evaluation findings, risk stratification, comorbidities and participant's personal goals.       Expected Outcomes  Short Term: Increase workloads from initial exercise prescription for resistance, speed, and METs.;Short Term: Perform resistance training exercises routinely during rehab and add in resistance training at home;Long Term: Improve cardiorespiratory fitness, muscular endurance and strength as measured by increased METs and functional capacity ( )       Able to understand and use rate of perceived exertion (RPE) scale  Yes       Intervention  Provide education and explanation on how to use RPE scale       Expected Outcomes  Short Term: Able to use RPE daily in rehab to express subjective intensity level;Long Term:  Able to use RPE to guide intensity level when exercising independently       Knowledge and understanding of Target Heart Rate Range (THRR)  Yes       Intervention  Provide education and explanation of THRR including how the numbers were predicted and where they are located for reference       Expected Outcomes  Short Term: Able to state/look up THRR;Long Term: Able to use THRR to govern intensity when exercising independently;Short Term: Able to use daily as guideline for intensity in rehab       Able to check pulse independently  Yes       Intervention  Provide education and demonstration on how to check pulse in carotid and radial arteries.;Review the importance of being able  to check your own pulse for safety during independent exercise       Expected Outcomes  Short Term: Able to explain why pulse checking is important during independent exercise;Long Term: Able to check pulse independently and accurately       Understanding of Exercise Prescription  Yes       Intervention  Provide education, explanation, and written materials on patient's individual exercise prescription       Expected Outcomes  Short Term: Able to explain program exercise prescription;Long Term: Able to explain home exercise prescription to exercise independently       Improve claudication pain toleration; Improve walking ability  Yes       Intervention  Attend education sessions to aid in risk factor modification and understanding of disease process       Expected Outcomes  Short Term: Improve walking distance/time to onset of claudication pain;Long Term: Improve walking ability and toleration to claudication          Exercise Goals Re-Evaluation : Exercise Goals Re-Evaluation    Row Name 09/06/18 1100 09/18/18 1111 09/29/18 1019 10/23/18 1005       Exercise Goal Re-Evaluation   Exercise Goals Review  Improve claudication pain tolerance and improve walking ability;Able to understand and use rate of perceived exertion (RPE) scale;Increase Physical Activity  Able to understand and use rate of perceived exertion (RPE) scale;Increase Physical Activity;Knowledge and understanding of Target Heart Rate Range (THRR);Understanding of Exercise Prescription;Increase Strength and Stamina  Able to understand and use rate of perceived exertion (RPE) scale;Increase Physical Activity;Knowledge and understanding of Target Heart Rate Range (THRR);Understanding of Exercise Prescription;Increase Strength and Stamina  Able to understand and use rate of perceived exertion (RPE) scale;Increase Physical Activity;Knowledge and understanding of Target Heart Rate Range (THRR);Understanding of Exercise Prescription;Increase  Strength and Stamina;Improve claudication pain tolerance and improve walking ability    Comments  Patient able to understand and use RPE scale appropriately. Pt c/o claudication pain during walking station.  Reviewed HEP with patient. Reviewed THHR, RPE, NTG use, weather conditions, and end points of exercise.   Patient states she tries to do some exercise daily. Pt does seated stretches and some walking at home.  Patient has achieved personal goal of returning to exercise at gym 3 days/week. Patient is still limited by claudication pain with walking. Pt's goal is to resume lifting 5lb weights that she was doing prior to her hospitalization.    Expected Outcomes  Increase workloads as tolerated to help improve cardiorespiratory fitness. Pt will have less pain with walking.  Patient will continue to walk everyday for 20 minutes. Will continue to monitor and progress as tolerated.   Progress workloads as tolerated to help increase strength and stamina.  Patient will increase hand weights from 3 to 4lbs to help achieve goal of resuming lifting 5lb weights.       Discharge Exercise Prescription (Final Exercise Prescription Changes): Exercise Prescription Changes - 11/06/18 0955      Response to Exercise   Blood Pressure (Admit)  102/60    Blood Pressure (Exercise)  110/62    Blood Pressure (Exit)  102/60    Heart Rate (Admit)  80 bpm    Heart Rate (Exercise)  107 bpm    Heart Rate (Exit)  80 bpm    Rating of Perceived Exertion (Exercise)  14    Symptoms  claudication, bilateral    Comments  switched from bike to rower    Duration  Progress to 30 minutes of  aerobic without signs/symptoms of physical distress    Intensity  THRR unchanged      Progression   Progression  Continue to progress workloads to maintain intensity without signs/symptoms of physical distress.    Average METs  3.5      Resistance Training   Training Prescription  Yes    Weight  3lbs    Reps  10-15    Time  10 Minutes       Interval Training   Interval Training  No      Bike   Level  --    Minutes  --    METs  --      NuStep   Level  4    SPM  85    Minutes  10    METs  3.7      Rower   Level  3    Watts  14    Minutes  10    METs  4.54      Track   Laps  7    Minutes  10    METs  2.23      Home Exercise Plan   Plans to continue exercise at  Home (comment)    Frequency  Add 2 additional days to program exercise sessions.    Initial Home Exercises Provided  09/18/18       Nutrition:  Target Goals: Understanding of nutrition guidelines, daily intake of sodium 1500mg , cholesterol 200mg , calories 30% from fat and 7% or less from saturated fats, daily to have 5 or more servings of fruits and vegetables.  Biometrics: Pre Biometrics - 08/29/18 1249      Pre Biometrics   Height  4\' 11"  (1.499 m)    Weight  53.4 kg    Waist Circumference  31 inches    Hip Circumference  37.25 inches    Waist to Hip Ratio  0.83 %    BMI (Calculated)  23.77    Triceps Skinfold  23.5 mm    % Body Fat  36.5 %    Grip Strength  13.5 kg    Flexibility  11.25 in    Single Leg Stand  18.28 seconds        Nutrition Therapy Plan and Nutrition Goals: Nutrition Therapy & Goals - 08/29/18 1001      Nutrition Therapy   Diet  heart healthy      Personal Nutrition Goals   Nutrition Goal  Pt to identify food quantities necessary to achieve weight maintenance or weight gain of 4-5 lbs lbs. at graduation from cardiac rehab.     Personal Goal #2  Pt to identify and limit food sources of saturated fat, trans fat, refined carbohydrates and sodium      Intervention Plan   Intervention  Prescribe, educate and counsel regarding individualized specific dietary modifications aiming towards targeted core components such as weight, hypertension, lipid management, diabetes, heart failure and other comorbidities.    Expected Outcomes  Short Term Goal: Understand basic principles of dietary content, such as calories,  fat, sodium, cholesterol and nutrients.;Long Term Goal: Adherence to prescribed nutrition plan.       Nutrition Assessments: Nutrition Assessments - 11/13/18 1500      MEDFICTS Scores   Pre Score  21    Post Score  27  increase in score noted, liberalization to help with weight maintenance   Score Difference  6       Nutrition Goals Re-Evaluation: Nutrition Goals Re-Evaluation    Row Name 08/29/18 1001             Goals   Current Weight  117 lb 11.6 oz (53.4 kg)          Nutrition Goals Re-Evaluation: Nutrition Goals Re-Evaluation    Row Name 08/29/18 1001             Goals   Current Weight  117 lb 11.6 oz (53.4 kg)          Nutrition Goals Discharge (Final Nutrition Goals Re-Evaluation): Nutrition Goals Re-Evaluation - 08/29/18 1001      Goals   Current Weight  117 lb 11.6 oz (53.4 kg)       Psychosocial: Target Goals: Acknowledge presence or absence of significant depression and/or stress, maximize coping skills, provide positive support system. Participant is able to verbalize types and ability to use techniques and skills needed for reducing stress and depression.  Initial Review & Psychosocial Screening: Initial Psych Review & Screening - 08/29/18 1026      Initial Review   Current issues with  None Identified      Family Dynamics   Good Support System?  Yes   husband and Dennard Nip, daughter      Barriers   Psychosocial barriers to participate in program  There are no identifiable barriers or psychosocial needs.      Screening Interventions   Interventions  Encouraged to exercise       Quality of Life Scores: Quality of Life - 08/29/18 1027      Quality of Life   Select  Quality of Life      Quality of Life Scores   Health/Function Pre  23.13 %    Socioeconomic Pre  27.75 %    Psych/Spiritual Pre  26.57 %    Family Pre  30 %    GLOBAL Pre  25.96 %      Scores of 19 and below usually indicate a poorer quality of life in these areas.   A difference of  2-3 points is a clinically meaningful difference.  A difference of 2-3 points in the total score of the Quality of Life Index has been associated with significant improvement in overall quality of life, self-image, physical symptoms, and general health in studies assessing change in quality of life.  PHQ-9: Recent Review Flowsheet Data    Depression screen [facility] 2/9 09/06/2018 12/25/2013   Decreased Interest 0 0   Down, Depressed, Hopeless 0 0   PHQ - 2 Score 0 0     Interpretation of Total Score  Total Score Depression Severity:  1-4 = Minimal depression, 5-9 = Mild depression, 10-14 = Moderate depression, 15-19 = Moderately severe depression, 20-27 = Severe depression   Psychosocial Evaluation and Intervention:   Psychosocial Re-Evaluation: Psychosocial Re-Evaluation    Row Name 10/04/18 1636 10/26/18 0836 11/14/18 1528         Psychosocial Re-Evaluation   Current issues with  Current Stress Concerns  Current Stress Concerns  Current Stress Concerns     Comments  Pat's daughter has MS. Will continue to provide emotional support as needed  Dennie Bible says her daughter is in a better state of health and is getting around better  Dennie Bible says her daughter is in a better state of health and is getting around better  Interventions  Stress management education  Stress management education  Stress management education     Continue Psychosocial Services   Follow up required by staff  Follow up required by staff  Follow up required by staff       Initial Review   Source of Stress Concerns  Family  Family  Family        Psychosocial Discharge (Final Psychosocial Re-Evaluation): Psychosocial Re-Evaluation - 11/14/18 1528      Psychosocial Re-Evaluation   Current issues with  Current Stress Concerns    Comments  Dennie Bible says her daughter is in a better state of health and is getting around better    Interventions  Stress management education    Continue Psychosocial Services   Follow  up required by staff      Initial Review   Source of Stress Concerns  Family       Vocational Rehabilitation: Provide vocational rehab assistance to qualifying candidates.   Vocational Rehab Evaluation & Intervention: Vocational Rehab - 08/29/18 1027      Initial Vocational Rehab Evaluation & Intervention   Assessment shows need for Vocational Rehabilitation  No   retired       Education: Education Goals: Education classes will be provided on a weekly basis, covering required topics. Participant will state understanding/return demonstration of topics presented.  Learning Barriers/Preferences: Learning Barriers/Preferences - 08/29/18 1216      Learning Barriers/Preferences   Learning Barriers  Sight    Learning Preferences  Written Material;Video       Education Topics: Count Your Pulse:  -Group instruction provided by verbal instruction, demonstration, patient participation and written materials to support subject.  Instructors address importance of being able to find your pulse and how to count your pulse when at home without a heart monitor.  Patients get hands on experience counting their pulse with staff help and individually.   Heart Attack, Angina, and Risk Factor Modification:  -Group instruction provided by verbal instruction, video, and written materials to support subject.  Instructors address signs and symptoms of angina and heart attacks.    Also discuss risk factors for heart disease and how to make changes to improve heart health risk factors.   Functional Fitness:  -Group instruction provided by verbal instruction, demonstration, patient participation, and written materials to support subject.  Instructors address safety measures for doing things around the house.  Discuss how to get up and down off the floor, how to pick things up properly, how to safely get out of a chair without assistance, and balance training.   Meditation and Mindfulness:  -Group  instruction provided by verbal instruction, patient participation, and written materials to support subject.  Instructor addresses importance of mindfulness and meditation practice to help reduce stress and improve awareness.  Instructor also leads participants through a meditation exercise.    Stretching for Flexibility and Mobility:  -Group instruction provided by verbal instruction, patient participation, and written materials to support subject.  Instructors lead participants through series of stretches that are designed to increase flexibility thus improving mobility.  These stretches are additional exercise for major muscle groups that are typically performed during regular warm up and cool down.   Hands Only CPR:  -Group verbal, video, and participation provides a basic overview of AHA guidelines for community CPR. Role-play of emergencies allow participants the opportunity to practice calling for help and chest compression technique with discussion of AED use.   Hypertension: -Group verbal and written instruction that provides a  basic overview of hypertension including the most recent diagnostic guidelines, risk factor reduction with self-care instructions and medication management.    Nutrition I class: Heart Healthy Eating:  -Group instruction provided by PowerPoint slides, verbal discussion, and written materials to support subject matter. The instructor gives an explanation and review of the Therapeutic Lifestyle Changes diet recommendations, which includes a discussion on lipid goals, dietary fat, sodium, fiber, plant stanol/sterol esters, sugar, and the components of a well-balanced, healthy diet.   Nutrition II class: Lifestyle Skills:  -Group instruction provided by PowerPoint slides, verbal discussion, and written materials to support subject matter. The instructor gives an explanation and review of label reading, grocery shopping for heart health, heart healthy recipe  modifications, and ways to make healthier choices when eating out.   Diabetes Question & Answer:  -Group instruction provided by PowerPoint slides, verbal discussion, and written materials to support subject matter. The instructor gives an explanation and review of diabetes co-morbidities, pre- and post-prandial blood glucose goals, pre-exercise blood glucose goals, signs, symptoms, and treatment of hypoglycemia and hyperglycemia, and foot care basics.   Diabetes Blitz:  -Group instruction provided by PowerPoint slides, verbal discussion, and written materials to support subject matter. The instructor gives an explanation and review of the physiology behind type 1 and type 2 diabetes, diabetes medications and rational behind using different medications, pre- and post-prandial blood glucose recommendations and Hemoglobin A1c goals, diabetes diet, and exercise including blood glucose guidelines for exercising safely.    Portion Distortion:  -Group instruction provided by PowerPoint slides, verbal discussion, written materials, and food models to support subject matter. The instructor gives an explanation of serving size versus portion size, changes in portions sizes over the last 20 years, and what consists of a serving from each food group.   Stress Management:  -Group instruction provided by verbal instruction, video, and written materials to support subject matter.  Instructors review role of stress in heart disease and how to cope with stress positively.     Exercising on Your Own:  -Group instruction provided by verbal instruction, power point, and written materials to support subject.  Instructors discuss benefits of exercise, components of exercise, frequency and intensity of exercise, and end points for exercise.  Also discuss use of nitroglycerin and activating EMS.  Review options of places to exercise outside of rehab.  Review guidelines for sex with heart disease.   Cardiac Drugs I:   -Group instruction provided by verbal instruction and written materials to support subject.  Instructor reviews cardiac drug classes: antiplatelets, anticoagulants, beta blockers, and statins.  Instructor discusses reasons, side effects, and lifestyle considerations for each drug class.   Cardiac Drugs II:  -Group instruction provided by verbal instruction and written materials to support subject.  Instructor reviews cardiac drug classes: angiotensin converting enzyme inhibitors (ACE-I), angiotensin II receptor blockers (ARBs), nitrates, and calcium channel blockers.  Instructor discusses reasons, side effects, and lifestyle considerations for each drug class.   Anatomy and Physiology of the Circulatory System:  Group verbal and written instruction and models provide basic cardiac anatomy and physiology, with the coronary electrical and arterial systems. Review of: AMI, Angina, Valve disease, Heart Failure, Peripheral Artery Disease, Cardiac Arrhythmia, Pacemakers, and the ICD.   Other Education:  -Group or individual verbal, written, or video instructions that support the educational goals of the cardiac rehab program.   Holiday Eating Survival Tips:  -Group instruction provided by PowerPoint slides, verbal discussion, and written materials to support subject matter. The instructor  gives patients tips, tricks, and techniques to help them not only survive but enjoy the holidays despite the onslaught of food that accompanies the holidays.   Knowledge Questionnaire Score: Knowledge Questionnaire Score - 08/29/18 1027      Knowledge Questionnaire Score   Pre Score  18/24       Core Components/Risk Factors/Patient Goals at Admission: Personal Goals and Risk Factors at Admission - 08/29/18 1214      Core Components/Risk Factors/Patient Goals on Admission   Hypertension  Yes    Intervention  Provide education on lifestyle modifcations including regular physical activity/exercise, weight  management, moderate sodium restriction and increased consumption of fresh fruit, vegetables, and low fat dairy, alcohol moderation, and smoking cessation.;Monitor prescription use compliance.    Expected Outcomes  Short Term: Continued assessment and intervention until BP is < 140/71mm HG in hypertensive participants. < 130/76mm HG in hypertensive participants with diabetes, heart failure or chronic kidney disease.;Long Term: Maintenance of blood pressure at goal levels.    Lipids  Yes    Intervention  Provide education and support for participant on nutrition & aerobic/resistive exercise along with prescribed medications to achieve LDL 70mg , HDL >40mg .    Expected Outcomes  Short Term: Participant states understanding of desired cholesterol values and is compliant with medications prescribed. Participant is following exercise prescription and nutrition guidelines.;Long Term: Cholesterol controlled with medications as prescribed, with individualized exercise RX and with personalized nutrition plan. Value goals: LDL < 70mg , HDL > 40 mg.       Core Components/Risk Factors/Patient Goals Review:  Goals and Risk Factor Review    Row Name 09/07/18 1054 10/04/18 1637 10/26/18 0839 11/14/18 1529       Core Components/Risk Factors/Patient Goals Review   Personal Goals Review  Hypertension;Lipids  Hypertension;Lipids;Stress  Hypertension;Lipids;Stress  Hypertension;Lipids;Stress    Review  09/06/18 was Pat's first days of exercise at cardiac rehab  Pat's vital sign have been stable. Dennie Bible is concerned about her daughter health  Pat's vital sign have been stable. Dennie Bible is doing okay with exercise and takes breaks on the walking track due to claudication  Pat's vital sign have been stable. Dennie Bible is doing okay with exercise and takes breaks on the walking track due to claudication    Expected Outcomes  Patient will participate in phase 2 cardiac rehab. Follow exercise, nutrition and lifestyle modfication  opportunities.  Patient will participate in phase 2 cardiac rehab. Follow exercise, nutrition and lifestyle modfication opportunities.  Patient will participate in phase 2 cardiac rehab. Follow exercise, nutrition and lifestyle modfication opportunities.  Patient will participate in phase 2 cardiac rehab. Follow exercise, nutrition and lifestyle modfication opportunities.       Core Components/Risk Factors/Patient Goals at Discharge (Final Review):  Goals and Risk Factor Review - 11/14/18 1529      Core Components/Risk Factors/Patient Goals Review   Personal Goals Review  Hypertension;Lipids;Stress    Review  Pat's vital sign have been stable. Dennie Bible is doing okay with exercise and takes breaks on the walking track due to claudication    Expected Outcomes  Patient will participate in phase 2 cardiac rehab. Follow exercise, nutrition and lifestyle modfication opportunities.       ITP Comments: ITP Comments    Row Name 08/29/18 0909 09/07/18 1056 10/04/18 1633 10/26/18 0836 11/14/18 1529   ITP Comments  Dr. Armanda Magic, Medical Director   30 Day ITP review. 09/06/18 was Pat's first day of exercise  30 Day ITP Review. Patient with good participation  despite having PAD  30 Day ITP Review. Patient with good participation despite having PAD  30 Day ITP Review. Patient with good participation and attendance in phase 2 cardiac rehab      Comments: See ITP comments.Gladstone Lighter, RN,BSN 11/16/2018 10:34 AM

## 2018-11-15 ENCOUNTER — Encounter (HOSPITAL_COMMUNITY)
Admission: RE | Admit: 2018-11-15 | Discharge: 2018-11-15 | Disposition: A | Discharge status: Home / Self Care | Payer: Medicare Other | Source: Ambulatory Visit | Attending: Interventional Cardiology | Admitting: Interventional Cardiology

## 2018-11-15 ENCOUNTER — Encounter (HOSPITAL_COMMUNITY): Payer: Medicare Other

## 2018-11-15 ENCOUNTER — Ambulatory Visit (HOSPITAL_COMMUNITY): Payer: Medicare Other

## 2018-11-15 DIAGNOSIS — J449 Chronic obstructive pulmonary disease, unspecified: Secondary | ICD-10-CM | POA: Diagnosis not present

## 2018-11-15 DIAGNOSIS — I10 Essential (primary) hypertension: Secondary | ICD-10-CM | POA: Diagnosis not present

## 2018-11-15 DIAGNOSIS — Z955 Presence of coronary angioplasty implant and graft: Secondary | ICD-10-CM | POA: Diagnosis not present

## 2018-11-15 DIAGNOSIS — I2510 Atherosclerotic heart disease of native coronary artery without angina pectoris: Secondary | ICD-10-CM | POA: Diagnosis not present

## 2018-11-17 ENCOUNTER — Encounter (HOSPITAL_COMMUNITY): Payer: Medicare Other

## 2018-11-17 ENCOUNTER — Ambulatory Visit (HOSPITAL_COMMUNITY): Payer: Medicare Other

## 2018-11-20 ENCOUNTER — Ambulatory Visit (HOSPITAL_COMMUNITY): Payer: Medicare Other

## 2018-11-20 ENCOUNTER — Encounter (HOSPITAL_COMMUNITY)
Admission: RE | Admit: 2018-11-20 | Discharge: 2018-11-20 | Disposition: A | Discharge status: Home / Self Care | Payer: Medicare Other | Source: Ambulatory Visit | Attending: Interventional Cardiology | Admitting: Interventional Cardiology

## 2018-11-20 ENCOUNTER — Encounter (HOSPITAL_COMMUNITY): Payer: Medicare Other

## 2018-11-20 DIAGNOSIS — J449 Chronic obstructive pulmonary disease, unspecified: Secondary | ICD-10-CM | POA: Diagnosis not present

## 2018-11-20 DIAGNOSIS — Z955 Presence of coronary angioplasty implant and graft: Secondary | ICD-10-CM

## 2018-11-20 DIAGNOSIS — I2510 Atherosclerotic heart disease of native coronary artery without angina pectoris: Secondary | ICD-10-CM | POA: Diagnosis not present

## 2018-11-20 DIAGNOSIS — I10 Essential (primary) hypertension: Secondary | ICD-10-CM | POA: Diagnosis not present

## 2018-11-22 ENCOUNTER — Ambulatory Visit (HOSPITAL_COMMUNITY): Payer: Medicare Other

## 2018-11-22 ENCOUNTER — Encounter (HOSPITAL_COMMUNITY): Payer: Medicare Other

## 2018-11-22 DIAGNOSIS — M792 Neuralgia and neuritis, unspecified: Secondary | ICD-10-CM | POA: Diagnosis not present

## 2018-11-24 ENCOUNTER — Ambulatory Visit (HOSPITAL_COMMUNITY): Payer: Medicare Other

## 2018-11-24 ENCOUNTER — Encounter (HOSPITAL_COMMUNITY): Payer: Medicare Other

## 2018-11-27 ENCOUNTER — Encounter (HOSPITAL_COMMUNITY): Payer: Medicare Other

## 2018-11-27 ENCOUNTER — Encounter (HOSPITAL_COMMUNITY)
Admission: RE | Admit: 2018-11-27 | Discharge: 2018-11-27 | Disposition: A | Discharge status: Home / Self Care | Payer: Medicare Other | Source: Ambulatory Visit | Attending: Interventional Cardiology | Admitting: Interventional Cardiology

## 2018-11-27 ENCOUNTER — Ambulatory Visit (HOSPITAL_COMMUNITY): Payer: Medicare Other

## 2018-11-27 DIAGNOSIS — Z955 Presence of coronary angioplasty implant and graft: Secondary | ICD-10-CM

## 2018-11-27 DIAGNOSIS — J449 Chronic obstructive pulmonary disease, unspecified: Secondary | ICD-10-CM | POA: Diagnosis not present

## 2018-11-27 DIAGNOSIS — I2510 Atherosclerotic heart disease of native coronary artery without angina pectoris: Secondary | ICD-10-CM | POA: Diagnosis not present

## 2018-11-27 DIAGNOSIS — I10 Essential (primary) hypertension: Secondary | ICD-10-CM | POA: Diagnosis not present

## 2018-12-01 ENCOUNTER — Telehealth: Payer: Self-pay | Admitting: Interventional Cardiology

## 2018-12-01 ENCOUNTER — Encounter (HOSPITAL_COMMUNITY)
Admission: RE | Admit: 2018-12-01 | Discharge: 2018-12-01 | Disposition: A | Discharge status: Home / Self Care | Payer: Medicare Other | Source: Ambulatory Visit | Attending: Interventional Cardiology | Admitting: Interventional Cardiology

## 2018-12-01 ENCOUNTER — Encounter (HOSPITAL_COMMUNITY): Payer: Medicare Other

## 2018-12-01 ENCOUNTER — Ambulatory Visit (HOSPITAL_COMMUNITY): Payer: Medicare Other

## 2018-12-01 DIAGNOSIS — I2510 Atherosclerotic heart disease of native coronary artery without angina pectoris: Secondary | ICD-10-CM | POA: Diagnosis not present

## 2018-12-01 DIAGNOSIS — Z955 Presence of coronary angioplasty implant and graft: Secondary | ICD-10-CM

## 2018-12-01 DIAGNOSIS — I10 Essential (primary) hypertension: Secondary | ICD-10-CM | POA: Diagnosis not present

## 2018-12-01 DIAGNOSIS — J449 Chronic obstructive pulmonary disease, unspecified: Secondary | ICD-10-CM | POA: Diagnosis not present

## 2018-12-01 MED ORDER — ASPIRIN EC 81 MG PO TBEC: 81.0000 mg | DELAYED_RELEASE_TABLET | Freq: Every day | ORAL | 3 refills | Status: DC

## 2018-12-01 NOTE — Telephone Encounter (Signed)
Pt reports taking chewable ASA.  She also reports taking it with food but that has not helped w/ stomach upset/indigestion. Advised pt to switch to enteric coated ASA and to continue taking it with food.  Pt agreeable to plan. Pt will call the office next week if this does not make a difference in indigestion. Rx sent to pharmacy. Pt appreciates the help with this and agreeable to plan.

## 2018-12-01 NOTE — Telephone Encounter (Signed)
New Message    Pt c/o medication issue:  1. Name of Medication: Aspirin 81mg   2. How are you currently taking this medication (dosage and times per day)? 81mg  patient stop taking it   3. Are you having a reaction (difficulty breathing--STAT)? Indigestion  4. What is your medication issue? Patient stopped taking medication causing indigestion

## 2018-12-04 ENCOUNTER — Encounter (HOSPITAL_COMMUNITY)
Admission: RE | Admit: 2018-12-04 | Discharge: 2018-12-04 | Disposition: A | Discharge status: Home / Self Care | Payer: Medicare Other | Source: Ambulatory Visit | Attending: Interventional Cardiology | Admitting: Interventional Cardiology

## 2018-12-04 ENCOUNTER — Encounter (HOSPITAL_COMMUNITY): Payer: Medicare Other

## 2018-12-04 ENCOUNTER — Ambulatory Visit (HOSPITAL_COMMUNITY): Payer: Medicare Other

## 2018-12-04 VITALS — BP 114/68 | HR 73 | Ht 59.25 in | Wt 117.7 lb

## 2018-12-04 DIAGNOSIS — I10 Essential (primary) hypertension: Secondary | ICD-10-CM | POA: Diagnosis not present

## 2018-12-04 DIAGNOSIS — I2510 Atherosclerotic heart disease of native coronary artery without angina pectoris: Secondary | ICD-10-CM | POA: Diagnosis not present

## 2018-12-04 DIAGNOSIS — J449 Chronic obstructive pulmonary disease, unspecified: Secondary | ICD-10-CM | POA: Diagnosis not present

## 2018-12-04 DIAGNOSIS — Z955 Presence of coronary angioplasty implant and graft: Secondary | ICD-10-CM

## 2018-12-04 NOTE — Progress Notes (Signed)
Discharge Progress Report  Patient Details  Name: Sydney Gross MRN: [id number] Date of Birth: June 06, 1930 Referring Provider:     [facility] from 08/29/2018 in [place]  Referring Provider  Daneen Schick, MD       Number of Visits: 29  Reason for Discharge:  Patient reached a stable level of exercise. Patient independent in their exercise. Patient has met program and personal goals.  Smoking History:  Social History   Tobacco Use  Smoking Status Never Smoker  Smokeless Tobacco Never Used    Diagnosis:  07/17/18 DES x2 LAD & D1  ADL [facility]:   Initial Exercise Prescription: Initial Exercise Prescription - 08/29/18 1200      Date of Initial Exercise RX and Referring Provider   Date  08/29/18    Referring Provider  Daneen Schick, MD    Expected Discharge Date  12/04/18      Treadmill   MPH  1.5    Grade  0    Minutes  10    METs  2.15      Bike   Level  0.4    Minutes  10    METs  2.44      NuStep   Level  2    SPM  85    Minutes  10    METs  2.1      Prescription Details   Frequency (times per week)  3    Duration  Progress to 30 minutes of continuous aerobic without signs/symptoms of physical distress      Intensity   THRR 40-80% of Max Heartrate  54-107    Ratings of Perceived Exertion  11-13    Perceived Dyspnea  0-4      Progression   Progression  Continue to progress workloads to maintain intensity without signs/symptoms of physical distress.      Resistance Training   Training Prescription  Yes    Weight  2lbs    Reps  10-15       Discharge Exercise Prescription (Final Exercise Prescription Changes): Exercise Prescription Changes - 12/04/18 0952      Response to Exercise   Blood Pressure (Admit)  100/60    Blood Pressure (Exercise)  114/72    Blood Pressure (Exit)  98/60    Heart Rate (Admit)  73 bpm    Heart Rate (Exercise)  102 bpm    Heart Rate (Exit)  73 bpm    Rating  of Perceived Exertion (Exercise)  11    Symptoms  claudication, bilateral    Duration  Progress to 30 minutes of  aerobic without signs/symptoms of physical distress    Intensity  THRR unchanged      Progression   Progression  Continue to progress workloads to maintain intensity without signs/symptoms of physical distress.    Average METs  3.4      Resistance Training   Training Prescription  Yes    Weight  3lbs    Reps  10-15    Time  10 Minutes      Interval Training   Interval Training  No      NuStep   Level  4    SPM  85    Minutes  10    METs  3.5      Rower   Level  3    Watts  12    Minutes  10    METs  4.52  Track   Laps  6    Minutes  10    METs  2.05      Home Exercise Plan   Plans to continue exercise at  Home (comment)    Frequency  Add 2 additional days to program exercise sessions.    Initial Home Exercises Provided  09/18/18       Functional Capacity: 6 Minute Walk    Row Name 08/29/18 0856 11/27/18 1015       6 Minute Walk   Phase  Initial  Discharge    Distance  1000 feet  1016 feet    Distance % Change  -  1.6 %    Walk Time  6 minutes  6 minutes    # of Rest Breaks  0  1 47mn 13sec seated rest break    MPH  1.89  1.92    METS  1.25  1.69    RPE  15  15    Perceived Dyspnea   0  0    VO2 Peak  4.36  5.92    Symptoms  Yes (comment)  Yes (comment)    Comments  Patient c/o bilateral claudication pain, which she rated a "10/10" on the pain scale. Pt given cart at 3 minutes.  Patient c/o bilateral claudication pain, which she rated a "10/10" on the pain scale. One seated rest break taken, 140m 13sec.    Resting HR  53 bpm  75 bpm    Resting BP  112/79  130/80    Resting Oxygen Saturation   98 %  -    Exercise Oxygen Saturation  during 6 min walk  99 %  -    Max Ex. HR  78 bpm  100 bpm    Max Ex. BP  120/80  149/84    2 Minute Post BP  104/62  124/72       Psychological, QOL, Others - Outcomes: PHQ 2/9: Depression [facility]  12/01/2018 09/06/2018 12/25/2013  Decreased Interest 0 0 0  Down, Depressed, Hopeless 0 0 0  PHQ - 2 Score 0 0 0    Quality of Life: Quality of Life - 11/17/18 0759      Quality of Life   Select  Quality of Life      Quality of Life Scores   Health/Function Pre  23.13 %    Health/Function Post  23.35 %    Health/Function % Change  0.95 %    Socioeconomic Pre  27.75 %    Socioeconomic Post  29.14 %    Socioeconomic % Change   5.01 %    Psych/Spiritual Pre  26.57 %    Psych/Spiritual Post  27.86 %    Psych/Spiritual % Change  4.86 %    Family Pre  30 %    Family Post  20.4 %    Family % Change  -32 %    GLOBAL Pre  25.96 %    GLOBAL Post  25.14 %    GLOBAL % Change  -3.16 %       Personal Goals: Goals established at orientation with interventions provided to work toward goal. Personal Goals and Risk Factors at Admission - 08/29/18 1214      Core Components/Risk Factors/Patient Goals on Admission   Hypertension  Yes    Intervention  Provide education on lifestyle modifcations including regular physical activity/exercise, weight management, moderate sodium restriction and increased consumption of fresh fruit, vegetables, and low fat  dairy, alcohol moderation, and smoking cessation.;Monitor prescription use compliance.    Expected Outcomes  Short Term: Continued assessment and intervention until BP is < 140/78m HG in hypertensive participants. < 130/855mHG in hypertensive participants with diabetes, heart failure or chronic kidney disease.;Long Term: Maintenance of blood pressure at goal levels.    Lipids  Yes    Intervention  Provide education and support for participant on nutrition & aerobic/resistive exercise along with prescribed medications to achieve LDL <7067mHDL >11m51m  Expected Outcomes  Short Term: Participant states understanding of desired cholesterol values and is compliant with medications prescribed. Participant is following exercise prescription and nutrition  guidelines.;Long Term: Cholesterol controlled with medications as prescribed, with individualized exercise RX and with personalized nutrition plan. Value goals: LDL < 70mg30mL > 40 mg.        Personal Goals Discharge: Goals and Risk Factor Review    Row Name 09/07/18 1054 10/04/18 1637 10/26/18 0839 11/14/18 1529 12/04/18 0842     Core Components/Risk Factors/Patient Goals Review   Personal Goals Review  Hypertension;Lipids  Hypertension;Lipids;Stress  Hypertension;Lipids;Stress  Hypertension;Lipids;Stress  Hypertension;Lipids;Stress   Review  09/06/18 was Pat's first days of exercise at cardiac rehab  Pat's vital sign have been stable. Pat iFraser Dinoncerned about her daughter health  Pat's vital sign have been stable. Pat iFraser Dinoing okay with exercise and takes breaks on the walking track due to claudication  Pat's vital sign have been stable. Pat iFraser Dinoing okay with exercise and takes breaks on the walking track due to claudication  Pat's vital sign have been stable. Pat iFraser Dinoing okay with exercise and takes breaks on the walking track due to claudication   Expected Outcomes  Patient will participate in phase 2 cardiac rehab. Follow exercise, nutrition and lifestyle modfication opportunities.  Patient will participate in phase 2 cardiac rehab. Follow exercise, nutrition and lifestyle modfication opportunities.  Patient will participate in phase 2 cardiac rehab. Follow exercise, nutrition and lifestyle modfication opportunities.  Patient will participate in phase 2 cardiac rehab. Follow exercise, nutrition and lifestyle modfication opportunities.  Patient will continue to exercise at the club upon discharge from cardiac rehab. Follow exercise, nutrition and lifestyle modfication opportunities.      Exercise Goals and Review: Exercise Goals    Row Name 08/29/18 0903             Exercise Goals   Increase Physical Activity  Yes       Intervention  Provide advice, education, support and counseling  about physical activity/exercise needs.;Develop an individualized exercise prescription for aerobic and resistive training based on initial evaluation findings, risk stratification, comorbidities and participant's personal goals.       Expected Outcomes  Short Term: Attend rehab on a regular basis to increase amount of physical activity.;Long Term: Exercising regularly at least 3-5 days a week.;Long Term: Add in home exercise to make exercise part of routine and to increase amount of physical activity.       Increase Strength and Stamina  Yes       Intervention  Provide advice, education, support and counseling about physical activity/exercise needs.;Develop an individualized exercise prescription for aerobic and resistive training based on initial evaluation findings, risk stratification, comorbidities and participant's personal goals.       Expected Outcomes  Short Term: Increase workloads from initial exercise prescription for resistance, speed, and METs.;Short Term: Perform resistance training exercises routinely during rehab and add in resistance training at home;Long Term: Improve cardiorespiratory fitness,  muscular endurance and strength as measured by increased METs and functional capacity (6MWT)       Able to understand and use rate of perceived exertion (RPE) scale  Yes       Intervention  Provide education and explanation on how to use RPE scale       Expected Outcomes  Short Term: Able to use RPE daily in rehab to express subjective intensity level;Long Term:  Able to use RPE to guide intensity level when exercising independently       Knowledge and understanding of Target Heart Rate Range (THRR)  Yes       Intervention  Provide education and explanation of THRR including how the numbers were predicted and where they are located for reference       Expected Outcomes  Short Term: Able to state/look up THRR;Long Term: Able to use THRR to govern intensity when exercising independently;Short Term:  Able to use daily as guideline for intensity in rehab       Able to check pulse independently  Yes       Intervention  Provide education and demonstration on how to check pulse in carotid and radial arteries.;Review the importance of being able to check your own pulse for safety during independent exercise       Expected Outcomes  Short Term: Able to explain why pulse checking is important during independent exercise;Long Term: Able to check pulse independently and accurately       Understanding of Exercise Prescription  Yes       Intervention  Provide education, explanation, and written materials on patient's individual exercise prescription       Expected Outcomes  Short Term: Able to explain program exercise prescription;Long Term: Able to explain home exercise prescription to exercise independently       Improve claudication pain toleration; Improve walking ability  Yes       Intervention  Attend education sessions to aid in risk factor modification and understanding of disease process       Expected Outcomes  Short Term: Improve walking distance/time to onset of claudication pain;Long Term: Improve walking ability and toleration to claudication          Exercise Goals Re-Evaluation: Exercise Goals Re-Evaluation    Row Name 09/06/18 1100 09/18/18 1111 09/29/18 1019 10/23/18 1005 11/20/18 1010     Exercise Goal Re-Evaluation   Exercise Goals Review  Improve claudication pain tolerance and improve walking ability;Able to understand and use rate of perceived exertion (RPE) scale;Increase Physical Activity  Able to understand and use rate of perceived exertion (RPE) scale;Increase Physical Activity;Knowledge and understanding of Target Heart Rate Range (THRR);Understanding of Exercise Prescription;Increase Strength and Stamina  Able to understand and use rate of perceived exertion (RPE) scale;Increase Physical Activity;Knowledge and understanding of Target Heart Rate Range (THRR);Understanding of  Exercise Prescription;Increase Strength and Stamina  Able to understand and use rate of perceived exertion (RPE) scale;Increase Physical Activity;Knowledge and understanding of Target Heart Rate Range (THRR);Understanding of Exercise Prescription;Increase Strength and Stamina;Improve claudication pain tolerance and improve walking ability  Able to understand and use rate of perceived exertion (RPE) scale;Increase Physical Activity;Knowledge and understanding of Target Heart Rate Range (THRR);Understanding of Exercise Prescription;Increase Strength and Stamina;Improve claudication pain tolerance and improve walking ability   Comments  Patient able to understand and use RPE scale appropriately. Pt c/o claudication pain during walking station.  Reviewed HEP with patient. Reviewed THHR, RPE, NTG use, weather conditions, and end points of exercise.  Patient states she tries to do some exercise daily. Pt does seated stretches and some walking at home.  Patient has achieved personal goal of returning to exercise at gym 3 days/week. Patient is still limited by claudication pain with walking. Pt's goal is to resume lifting 5lb weights that she was doing prior to her hospitalization.  Pt states that there are some days she can walk further without pain. Pt is exercising at the gym riding bike, rower, and taking fitness/balance classes. Upon completion of the program pt will exercise 60 minutes, 3 days/week at the gym.   Expected Outcomes  Increase workloads as tolerated to help improve cardiorespiratory fitness. Pt will have less pain with walking.  Patient will continue to walk everyday for 20 minutes. Will continue to monitor and progress as tolerated.   Progress workloads as tolerated to help increase strength and stamina.  Patient will increase hand weights from 3 to 4lbs to help achieve goal of resuming lifting 5lb weights.  Patient will continue exercise at the gym upon completion of the cardiac rehab program.   [place]  Name 11/27/18 1025 12/04/18 1036           Exercise Goal Re-Evaluation   Exercise Goals Review  Able to understand and use rate of perceived exertion (RPE) scale;Increase Physical Activity;Knowledge and understanding of Target Heart Rate Range (THRR);Understanding of Exercise Prescription;Increase Strength and Stamina;Improve claudication pain tolerance and improve walking ability  Able to understand and use rate of perceived exertion (RPE) scale;Increase Physical Activity;Knowledge and understanding of Target Heart Rate Range (THRR);Understanding of Exercise Prescription;Increase Strength and Stamina      Comments  Pt states claudication pain with walking hasn't improved significantly. [facility]. Pt is walking the track, rowing for 10 minutes, and riding bike for 30 minutes. Encouraged pt to increase walking duration at the gym, taking rest breaks when needed to help improve walking ability.  Patient completed the phase 2 cardiac rehab program and has done well. Reviewed exercise prescription with patient, and pt plans to continue exercise at the gym.       Expected Outcomes  Patient will continue exercise at the gym upon completion of the cardiac rehab program.  Patient will exercise at the gym, rowing, cycling, walking, and taking fitness classes to help achieve personal health and fitness goals.         Nutrition & Weight - Outcomes: Pre Biometrics - 08/29/18 1249      Pre Biometrics   Height  _0  (1.499 m)    Weight  117 lb 11.6 oz (53.4 kg)    Waist Circumference  31 inches    Hip Circumference  37.25 inches    Waist to Hip Ratio  0.83 %    BMI (Calculated)  23.77    Triceps Skinfold  23.5 mm    % Body Fat  36.5 %    Grip Strength  13.5 kg    Flexibility  11.25 in    Single Leg Stand  18.28 seconds      Post Biometrics - 12/04/18 0952       Post  Biometrics   Height  4' 11.25" (1.505 m)    Weight   117 lb 11.6 oz (53.4 kg)    Waist Circumference  31.25 inches    Hip Circumference  36.75 inches    Waist to Hip Ratio  0.85 %    BMI (Calculated)  23.58    Triceps Skinfold  25 mm    % Body Fat  37 %    Grip Strength  14.5 kg    Flexibility  10 in    Single Leg Stand  30 seconds       Nutrition: Nutrition Therapy & Goals - 01/01/19 0729      Nutrition Therapy   Diet  heart healthy      Personal Nutrition Goals   Nutrition Goal  Pt to identify food quantities necessary to achieve weight maintenance or weight gain of 4-5 lbs lbs. at graduation from cardiac rehab.     Personal Goal #2  Pt to identify and limit food sources of saturated fat, trans fat, refined carbohydrates and sodium      Intervention Plan   Intervention  Prescribe, educate and counsel regarding individualized specific dietary modifications aiming towards targeted core components such as weight, hypertension, lipid management, diabetes, heart failure and other comorbidities.    Expected Outcomes  Short Term Goal: Understand basic principles of dietary content, such as calories, fat, sodium, cholesterol and nutrients.;Long Term Goal: Adherence to prescribed nutrition plan.       Nutrition Discharge: Nutrition Assessments - 01/01/19 0729      MEDFICTS Scores   Pre Score  21    Post Score  27   increase in score noted, liberalization to help with weight maintenance   Score Difference  6       Education Questionnaire Score: Knowledge Questionnaire Score - 11/17/18 0758      Knowledge Questionnaire Score   Pre Score  18/24    Post Score  13/24       Goals reviewed with patient; copy given to patient.Pt graduated from cardiac rehab program today with completion of 36 exercise sessions in Phase II. Pt maintained good attendance and progressed nicely during his participation in rehab as evidenced by increased MET level.   Medication list reconciled. Repeat  PHQ score- 0 Pt has made significant lifestyle changes  and should be commended for her success. Pt feels she has achieved her goals during cardiac rehab.   Pt plans to continue exercise at the club. Patient reported that she has stopped taking her chewable 81 mg aspirin about 3 weeks ago due reports of indigestion every time she took the medication. Dr Thompson Caul office called and notified. Patient reports feeling stronger since she has been participation in phase 2 cardiac rehab. Pat maintained her current weight. Fraser Din continues to have claudication pain with ambulation and takes rest breaks when she needs to. We are proud of Pat's progress. Barnet Pall, RN,BSN 01/01/2019 12:05 PM

## 2018-12-08 ENCOUNTER — Encounter (HOSPITAL_COMMUNITY): Payer: Medicare Other

## 2018-12-08 ENCOUNTER — Ambulatory Visit (HOSPITAL_COMMUNITY): Payer: Medicare Other

## 2018-12-21 DIAGNOSIS — M810 Age-related osteoporosis without current pathological fracture: Secondary | ICD-10-CM | POA: Diagnosis not present

## 2019-01-01 NOTE — Addendum Note (Signed)
Encounter addended by: Enid Skeens, RD on: 01/01/2019 7:46 AM  Actions taken: Flowsheet data copied forward, Visit Navigator Flowsheet section accepted

## 2019-01-10 DIAGNOSIS — D485 Neoplasm of uncertain behavior of skin: Secondary | ICD-10-CM | POA: Diagnosis not present

## 2019-01-10 DIAGNOSIS — C44529 Squamous cell carcinoma of skin of other part of trunk: Secondary | ICD-10-CM | POA: Diagnosis not present

## 2019-01-29 ENCOUNTER — Emergency Department (HOSPITAL_COMMUNITY): Payer: Medicare Other

## 2019-01-29 ENCOUNTER — Other Ambulatory Visit: Payer: Self-pay

## 2019-01-29 ENCOUNTER — Emergency Department (HOSPITAL_COMMUNITY)
Admission: EM | Admit: 2019-01-29 | Discharge: 2019-01-30 | Disposition: A | Discharge status: Home / Self Care | Payer: Medicare Other | Attending: Emergency Medicine | Admitting: Emergency Medicine

## 2019-01-29 ENCOUNTER — Encounter (HOSPITAL_COMMUNITY): Payer: Self-pay | Admitting: Emergency Medicine

## 2019-01-29 DIAGNOSIS — R091 Pleurisy: Secondary | ICD-10-CM | POA: Insufficient documentation

## 2019-01-29 DIAGNOSIS — R0600 Dyspnea, unspecified: Secondary | ICD-10-CM | POA: Diagnosis not present

## 2019-01-29 DIAGNOSIS — I2510 Atherosclerotic heart disease of native coronary artery without angina pectoris: Secondary | ICD-10-CM | POA: Insufficient documentation

## 2019-01-29 DIAGNOSIS — Z79899 Other long term (current) drug therapy: Secondary | ICD-10-CM | POA: Diagnosis not present

## 2019-01-29 DIAGNOSIS — R0789 Other chest pain: Secondary | ICD-10-CM | POA: Diagnosis present

## 2019-01-29 DIAGNOSIS — Z7982 Long term (current) use of aspirin: Secondary | ICD-10-CM | POA: Insufficient documentation

## 2019-01-29 DIAGNOSIS — R0602 Shortness of breath: Secondary | ICD-10-CM | POA: Diagnosis not present

## 2019-01-29 DIAGNOSIS — I5022 Chronic systolic (congestive) heart failure: Secondary | ICD-10-CM | POA: Diagnosis not present

## 2019-01-29 DIAGNOSIS — I110 Hypertensive heart disease with heart failure: Secondary | ICD-10-CM | POA: Insufficient documentation

## 2019-01-29 DIAGNOSIS — Z7901 Long term (current) use of anticoagulants: Secondary | ICD-10-CM | POA: Diagnosis not present

## 2019-01-29 DIAGNOSIS — R079 Chest pain, unspecified: Secondary | ICD-10-CM | POA: Diagnosis not present

## 2019-01-29 DIAGNOSIS — R05 Cough: Secondary | ICD-10-CM | POA: Diagnosis not present

## 2019-01-29 DIAGNOSIS — J441 Chronic obstructive pulmonary disease with (acute) exacerbation: Secondary | ICD-10-CM | POA: Insufficient documentation

## 2019-01-29 DIAGNOSIS — I10 Essential (primary) hypertension: Secondary | ICD-10-CM | POA: Diagnosis not present

## 2019-01-29 LAB — BASIC METABOLIC PANEL
Anion gap: 9 (ref 5–15)
BUN: 32 mg/dL — ABNORMAL HIGH (ref 8–23)
CO2: 24 mmol/L (ref 22–32)
Calcium: 9.6 mg/dL (ref 8.9–10.3)
Chloride: 107 mmol/L (ref 98–111)
Creatinine, Ser: 1.51 mg/dL — ABNORMAL HIGH (ref 0.44–1.00)
GFR calc Af Amer: 36 mL/min — ABNORMAL LOW (ref >60)
GFR calc non Af Amer: 31 mL/min — ABNORMAL LOW (ref >60)
Glucose, Bld: 109 mg/dL — ABNORMAL HIGH (ref 70–99)
Potassium: 4.3 mmol/L (ref 3.5–5.1)
Sodium: 140 mmol/L (ref 135–145)

## 2019-01-29 LAB — CBC
HCT: 37.1 % (ref 36.0–46.0)
Hemoglobin: 11.5 g/dL — ABNORMAL LOW (ref 12.0–15.0)
MCH: 31.3 pg (ref 26.0–34.0)
MCHC: 31 g/dL (ref 30.0–36.0)
MCV: 101.1 fL — ABNORMAL HIGH (ref 80.0–100.0)
Platelets: 229 10*3/uL (ref 150–400)
RBC: 3.67 MIL/uL — ABNORMAL LOW (ref 3.87–5.11)
RDW: 13.1 % (ref 11.5–15.5)
WBC: 12 10*3/uL — ABNORMAL HIGH (ref 4.0–10.5)
nRBC: 0 % (ref 0.0–0.2)

## 2019-01-29 LAB — I-STAT TROPONIN, ED: Troponin i, poc: 0.01 ng/mL (ref 0.00–0.08)

## 2019-01-29 MED ORDER — FENTANYL CITRATE (PF) 100 MCG/2ML IJ SOLN
50.0000 ug | Freq: Once | INTRAMUSCULAR | Status: AC
  Administered 2019-01-30: 50 ug via INTRAVENOUS
  Filled 2019-01-29: qty 2

## 2019-01-29 MED ORDER — NITROGLYCERIN 0.4 MG SL SUBL
0.4000 mg | SUBLINGUAL_TABLET | SUBLINGUAL | Status: DC | PRN
  Administered 2019-01-29 (×2): 0.4 mg via SUBLINGUAL
  Filled 2019-01-29: qty 1

## 2019-01-29 MED ORDER — KETOROLAC TROMETHAMINE 15 MG/ML IJ SOLN
15.0000 mg | Freq: Once | INTRAMUSCULAR | Status: AC
  Administered 2019-01-29: 15 mg via INTRAVENOUS
  Filled 2019-01-29: qty 1

## 2019-01-29 MED ORDER — ASPIRIN 81 MG PO CHEW
324.0000 mg | CHEWABLE_TABLET | Freq: Once | ORAL | Status: AC
Start: 2019-01-29 — End: 2019-01-29
  Administered 2019-01-29: 324 mg via ORAL
  Filled 2019-01-29: qty 4

## 2019-01-29 NOTE — ED Notes (Signed)
Patient's BP dropped from 128/47 to 81/48 after 2 nitroglycerin tablets. Patient asymptomatic and in NAD. MD notified and nitroglycerin held at this time.

## 2019-01-29 NOTE — ED Triage Notes (Signed)
Patient BIB [facility] with c/o CP after dinner tonight around 1930. 9/10 sharp pain radiating to left upper side. Had 2 stents placed in August. Has had a productive cough 2 weeks with yellow sputum but patient afebrile. Patient aox4, nad.

## 2019-01-29 NOTE — ED Notes (Signed)
RN informed ok for Pt to receive 2 visitors 

## 2019-01-30 LAB — I-STAT TROPONIN, ED: Troponin i, poc: 0.01 ng/mL (ref 0.00–0.08)

## 2019-01-30 MED ORDER — ACETAMINOPHEN 500 MG PO TABS: 1000.0000 mg | ORAL_TABLET | Freq: Two times a day (BID) | ORAL | 0 refills | Status: DC | PRN

## 2019-01-30 MED ORDER — DOXYCYCLINE HYCLATE 100 MG PO CAPS: 100.0000 mg | ORAL_CAPSULE | Freq: Two times a day (BID) | ORAL | 0 refills | Status: DC

## 2019-01-30 MED ORDER — DM-GUAIFENESIN ER 30-600 MG PO TB12
1.0000 | ORAL_TABLET | Freq: Two times a day (BID) | ORAL | Status: DC
  Administered 2019-01-30: 1 via ORAL
  Filled 2019-01-30: qty 1

## 2019-01-30 MED ORDER — IPRATROPIUM-ALBUTEROL 0.5-2.5 (3) MG/3ML IN SOLN
3.0000 mL | Freq: Once | RESPIRATORY_TRACT | Status: AC
  Administered 2019-01-30: 3 mL via RESPIRATORY_TRACT
  Filled 2019-01-30: qty 3

## 2019-01-30 MED ORDER — PREDNISONE 10 MG PO TABS: 50.0000 mg | ORAL_TABLET | Freq: Every day | ORAL | 0 refills | Status: DC

## 2019-01-30 MED ORDER — DOXYCYCLINE HYCLATE 100 MG PO TABS
100.0000 mg | ORAL_TABLET | Freq: Once | ORAL | Status: AC
  Administered 2019-01-30: 100 mg via ORAL
  Filled 2019-01-30: qty 1

## 2019-01-30 MED ORDER — BENZONATATE 100 MG PO CAPS: 100.0000 mg | ORAL_CAPSULE | Freq: Three times a day (TID) | ORAL | 0 refills | Status: DC

## 2019-01-30 MED ORDER — PREDNISONE 20 MG PO TABS
60.0000 mg | ORAL_TABLET | Freq: Once | ORAL | Status: AC
  Administered 2019-01-30: 60 mg via ORAL
  Filled 2019-01-30: qty 3

## 2019-01-30 NOTE — Discharge Instructions (Addendum)
We saw in the ER for chest pain that is worse with deep breathing.  He also has been having a cough.  We suspect that your symptoms are because of pleurisy and perhaps a mild COPD exacerbation.  Please take the medications prescribed for appropriate symptom control.  We recommend that you follow-up with your primary care doctor in 1 week. Return to the ER immediately if you start having worsening of shortness of breath, worsening of chest pain, fevers, chills, confusion.  Your lab work-up shows slightly elevated kidney enzyme, therefore ibuprofen is not recommended for pain control.  Simply take Tylenol that is prescribed for optimal pain control.

## 2019-01-30 NOTE — ED Notes (Signed)
Pt discharged from ED; instructions provided and scripts given; Pt encouraged to return to ED if symptoms worsen and to f/u with PCP; Pt verbalized understanding of all instructions 

## 2019-01-30 NOTE — ED Notes (Signed)
Signature pad doesn't work, pt verbalized understanding

## 2019-01-30 NOTE — ED Provider Notes (Signed)
[facility] EMERGENCY DEPARTMENT Provider Note   CSN: [id number] Arrival date & time: 01/29/19  2056    History   Chief Complaint Chief Complaint  Patient presents with  . Chest Pain    HPI Sydney Gross is a 85 y.o. female.     HPI  85 year old female comes in with chief complaint of chest pain.  Patient states that she started having chest pain earlier today.  She has been having cough for the last several days.  Her chest pain is worse with deep inspiration and when she coughs.  She denies any associated fevers, chills.  Patient's cough has been nonproductive.  She denies any known sick contacts, however does indicate that some of her friends have been having a cough for last several days.  Patient has not seen her PCP for this complaint.  Pt has no hx of PE, DVT and denies any exogenous hormone (testosterone / estrogen) use, long distance travels or surgery in the past 6 weeks, active cancer, recent immobilization.  Of note, patient states that she was diagnosed with pleurisy several months ago, and that her current chest pain is similar to that event.  She denies any radiation of the pain to her shoulder, jaw.  There is no history of ACS/cad.  Past Medical History:  Diagnosis Date  . Allergic rhinitis   . Arthritis    "maybe a little in my knees and back" (07/17/2018)  . Chronic lower back pain   . Colitis 12/2013   "never had this before"  . COPD (chronic obstructive pulmonary disease) (HCC)    "questionable/Dr. Katrinka Blazing" (07/17/2018)  . Coronary artery disease   . Exertional shortness of breath    "off and on" (07/17/2018)  . Family history of anesthesia complication    "mother; think they gave her too much; c/o pain after OR; gave her more RX; they had to bring her back real quick"  . H/O calcium pyrophosphate deposition disease (CPPD)   . Heart murmur   . Hiatal hernia   . Hyperlipidemia   . Hypertension   . Numbness in feet   . On home oxygen  therapy    "I've used it 2 times in 2 yrs" (07/17/2018)  . PAD (peripheral artery disease) (HCC)   . Pneumonia    "1st grade & 4th grade" (07/17/2018)  . Proteinuria   . Shingles   . Stenosis of tear duct     Patient Active Problem List   Diagnosis Date Noted  . Chronic diastolic CHF (congestive heart failure) (HCC) 08/02/2018  . Coronary artery disease of native artery of native heart with stable angina pectoris (HCC)   . COPD exacerbation (HCC) 11/18/2016  . PAD (peripheral artery disease) (HCC) 11/18/2016  . Bradycardia   . Dilated aortic root (HCC) 01/27/2015  . Dyspnea on exertion 12/16/2014  . Aortic stenosis, supravalvular 12/16/2014  . Atherosclerosis of native arteries of extremity with intermittent claudication (HCC) 08/30/2014  . Malnutrition of moderate degree (HCC) 12/12/2013  . Hypertension 12/09/2013  . Dyslipidemia 12/09/2013  . Enteritis due to Clostridium difficile 12/05/2013    Past Surgical History:  Procedure Laterality Date  . ABDOMINAL HYSTERECTOMY  1960's  . APPENDECTOMY    . BACK SURGERY    . BLEPHAROPLASTY Bilateral 05/2017  . CARDIAC CATHETERIZATION    . CATARACT EXTRACTION, BILATERAL Bilateral   . CESAREAN SECTION  1957; 1960  . CORONARY STENT INTERVENTION N/A 07/17/2018   Procedure: CORONARY STENT INTERVENTION;  Surgeon: Katrinka Blazing,  Henry W, MD;  Location: [facility] INVASIVE CV LAB;  Service: Cardiovascular;  Laterality: N/A;  . DACROCYSTORHINOSTOMY W/ JONES TUBE Bilateral 05/2017   "put tear duct in my eyes"  . EYE SURGERY    . LAPAROSCOPIC CHOLECYSTECTOMY  ~ 2009  . LUMBAR DISC SURGERY  ?2004  . RIGHT/LEFT HEART CATH AND CORONARY ANGIOGRAPHY N/A 07/17/2018   Procedure: RIGHT/LEFT HEART CATH AND CORONARY ANGIOGRAPHY;  Surgeon: Lyn Records, MD;  Location: [facility] INVASIVE CV LAB;  Service: Cardiovascular;  Laterality: N/A;  . TONSILLECTOMY AND ADENOIDECTOMY  ?1940's     OB History   No obstetric history on file.      Home Medications    Prior to  Admission medications   Medication Sig Start Date End Date Taking? Authorizing Provider  aspirin EC 81 MG tablet Take 1 tablet (81 mg total) by mouth daily. 12/01/18  Yes Lyn Records, MD  budesonide-formoterol [facility]) 160-4.5 MCG/ACT inhaler Inhale 2 puffs into the lungs 2 (two) times daily.   Yes [provider]  cholecalciferol (VITAMIN D) 1000 units tablet Take 1,000 Units by mouth at bedtime.   Yes [provider]  clopidogrel (PLAVIX) 75 MG tablet Take 1 tablet (75 mg total) by mouth daily. 08/08/18  Yes Dyann Kief, PA-C  Denosumab (PROLIA [place]) Inject 1 Dose as directed every 6 (six) months. Use as directed.    Yes [provider]  diphenhydramine-acetaminophen (TYLENOL PM) 25-500 MG TABS Take 1-2 tablets by mouth at bedtime.    Yes [provider]  lisinopril-hydrochlorothiazide (PRINZIDE,ZESTORETIC) 20-25 MG tablet Take 1 tablet by mouth at bedtime.    Yes [provider]  metoprolol succinate (TOPROL-XL) 25 MG 24 hr tablet Take 0.5 tablets (12.5 mg total) by mouth daily. 09/01/18  Yes Lyn Records, MD  nitroGLYCERIN (NITROSTAT) 0.4 MG SL tablet Place 1 tablet (0.4 mg total) under the tongue every 5 (five) minutes as needed for chest pain. 07/18/18 07/18/19 Yes Kroeger, Ovidio Kin., PA-C  spironolactone (ALDACTONE) 25 MG tablet Take 12.5 mg by mouth daily. 11/22/18  Yes [provider]  acetaminophen (TYLENOL) 500 MG tablet Take 2 tablets (1,000 mg total) by mouth 2 (two) times daily as needed (pain). 01/30/19   Derwood Kaplan, MD  albuterol (PROVENTIL) (2.5 MG/3ML) 0.083% nebulizer solution Take 3 mLs (2.5 mg total) by nebulization every 4 (four) hours as needed for wheezing or shortness of breath. Patient not taking: Reported on 01/29/2019 11/21/16   Tyrone Nine, MD  benzonatate (TESSALON) 100 MG capsule Take 1 capsule (100 mg total) by mouth every 8 (eight) hours. 01/30/19   Derwood Kaplan, MD  doxycycline (VIBRAMYCIN) 100 MG  capsule Take 1 capsule (100 mg total) by mouth 2 (two) times daily. 01/30/19   Derwood Kaplan, MD  predniSONE (DELTASONE) 10 MG tablet Take 5 tablets (50 mg total) by mouth daily. 01/30/19   Derwood Kaplan, MD    Family History Family History  Problem Relation Age of Onset  . Emphysema Father 26  . CVA Mother   . Cancer Brother        ? type    Social History Social History   Tobacco Use  . Smoking status: Never Smoker  . Smokeless tobacco: Never Used  Substance Use Topics  . Alcohol use: Yes    Alcohol/week: 4.0 standard drinks    Types: 4 Glasses of wine per week    Comment: glass of wine every other night  . Drug usBarry Dienesever  Allergies   Codeine; Crestor [rosuvastatin]; Darvon [propoxyphene]; Lipitor [atorvastatin]; and Zocor [simvastatin]   Review of Systems Review of Systems  Constitutional: Positive for activity change. Negative for chills and fever.  Respiratory: Positive for shortness of breath.   Cardiovascular: Positive for chest pain.  Allergic/Immunologic: Negative for immunocompromised state.  Hematological: Does not bruise/bleed easily.  All other systems reviewed and are negative.    Physical Exam Updated Vital Signs BP (!) 114/55   Pulse 68   Temp 98.7 F (37.1 C)   Resp (!) 22   Ht 4\' 9"  (1.448 m)   Wt 50.3 kg   SpO2 97%   BMI 24.02 kg/m   Physical Exam Vitals signs and nursing note reviewed.  Constitutional:      Appearance: She is well-developed.  HENT:     Head: Normocephalic and atraumatic.  Eyes:     Pupils: Pupils are equal, round, and reactive to light.  Neck:     Musculoskeletal: Neck supple.  Cardiovascular:     Rate and Rhythm: Normal rate and regular rhythm.     Heart sounds: Normal heart sounds. No murmur.  Pulmonary:     Effort: Pulmonary effort is normal. No respiratory distress.     Breath sounds: Normal breath sounds. No decreased breath sounds, wheezing or rhonchi.  Abdominal:     General: There is no  distension.     Palpations: Abdomen is soft.     Tenderness: There is no abdominal tenderness. There is no guarding or rebound.  Musculoskeletal:     Right lower leg: She exhibits no tenderness. No edema.     Left lower leg: She exhibits no tenderness. No edema.  Skin:    General: Skin is warm and dry.  Neurological:     Mental Status: She is alert and oriented to person, place, and time.       ED Treatments / Results  Labs (all labs ordered are listed, but only abnormal results are displayed) Labs Reviewed  BASIC METABOLIC PANEL - Abnormal; Notable for the following components:      Result Value   Glucose, Bld 109 (*)    BUN 32 (*)    Creatinine, Ser 1.51 (*)    GFR calc non Af Amer 31 (*)    GFR calc Af Amer 36 (*)    All other components within normal limits  CBC - Abnormal; Notable for the following components:   WBC 12.0 (*)    RBC 3.67 (*)    Hemoglobin 11.5 (*)    MCV 101.1 (*)    All other components within normal limits  I-STAT TROPONIN, ED  I-STAT TROPONIN, ED    EKG EKG Interpretation  Date/Time:  Monday January 29 2019 21:02:21 EST Ventricular Rate:  75 PR Interval:    QRS Duration: 80 QT Interval:  385 QTC Calculation: 430 R Axis:   39 Text Interpretation:  Sinus rhythm No acute changes Nonspecific ST and T wave abnormality No significant change since last tracing Confirmed by Derwood Kaplan [phone]) on 01/29/2019 9:52:46 PM   Radiology Dg Chest 2 View  Result Date: 01/29/2019 CLINICAL DATA:  Chest pain EXAM: CHEST - 2 VIEW COMPARISON:  11/20/2016 FINDINGS: There is hyperinflation of the lungs compatible with COPD. Heart is borderline in size. No confluent airspace opacities, effusions or edema. No acute bony abnormality. IMPRESSION: COPD.  No active disease. Electronically Signed   By: Charlett Nose M.D.   On: 01/29/2019 21:43    Procedures Procedures (including  critical care time)  Medications Ordered in ED Medications  nitroGLYCERIN  (NITROSTAT) SL tablet 0.4 mg (0 mg Sublingual Hold 01/29/19 2215)  dextromethorphan-guaiFENesin (MUCINEX DM) 30-600 MG per 12 hr tablet 1 tablet (1 tablet Oral Given 01/30/19 0031)  aspirin chewable tablet 324 mg (324 mg Oral Given 01/29/19 2159)  ketorolac (TORADOL) 15 MG/ML injection 15 mg (15 mg Intravenous Given 01/29/19 2223)  fentaNYL (SUBLIMAZE) injection 50 mcg (50 mcg Intravenous Given 01/30/19 0031)  ipratropium-albuterol (DUONEB) 0.5-2.5 (3) MG/3ML nebulizer solution 3 mL (3 mLs Nebulization Given 01/30/19 0031)  predniSONE (DELTASONE) tablet 60 mg (60 mg Oral Given 01/30/19 0031)  doxycycline (VIBRA-TABS) tablet 100 mg (100 mg Oral Given 01/30/19 0031)     Initial Impression / Assessment and Plan / ED Course  I have reviewed the triage vital signs and the nursing notes.  Pertinent labs & imaging results that were available during my care of the patient were reviewed by me and considered in my medical decision making (see chart for details).  Clinical Course as of Jan 30 34  Tue Jan 30, 2019  0028 Patient reassessed.  She reports persistent pleuritic chest pain.  She does indicate that Toradol helped her pain.  Her pain is also worse with palpation.  We discussed with the patient that the likelihood of pulmonary embolism for the cause of her pain is low because she does not have any risk factors for it.  However we cannot rule out a blood clot based on our evaluation alone.  She is comfortable with conservative measures of over-the-counter medications with return to the ER if her symptoms are progressing.  Strict ER return precautions discussed with the patient and the family.   [AN]    Clinical Course User Index [AN] Derwood Kaplan, MD       Patient comes in with chief complaint of chest pain.  She is been having cough for the last several days without any fevers, chills, body aches.  Today she started having some chest pain that is left-sided and pleuritic in nature.  Pain is also  worse with palpation of the chest and certain positioning.  Initial EKG is not showing any acute ischemic findings and her initial troponin is normal.  Patient has history of COPD, however she does not have any abnormal lung findings on her exam.  Chest x-ray does not show any pneumonia.  We considered PE in the differential, however patient does not have any risk factors for PE -and with her heart rate in the 60s and no hypoxia however pretest probability for PE is extremely low.  We decided to have a shared decision making with the patient, and she is comfortable with a conservative management and no further work-up for PE in the emergency room.  She will return to the ER if her symptoms are progressing.  Plan is to get delta troponin and see if we can help alleviate some of patient's discomfort with Toradol.  If the work-up stays negative then she will go home with prednisone to relieve some of the inflammation of the pleura that we think is causing her pain.  She will also go home with doxycycline given her history of COPD.  Final Clinical Impressions(s) / ED Diagnoses   Final diagnoses:  Pleurisy  COPD exacerbation [facility])    ED Discharge Orders         Ordered    predniSONE (DELTASONE) 10 MG tablet  Daily     01/30/19 0035    doxycycline (  VIBRAMYCIN) 100 MG capsule  2 times daily     01/30/19 0035    benzonatate (TESSALON) 100 MG capsule  Every 8 hours     01/30/19 0035    acetaminophen (TYLENOL) 500 MG tablet  2 times daily PRN     01/30/19 0035           Derwood Kaplan, MD 01/30/19 1610

## 2019-04-26 DIAGNOSIS — I10 Essential (primary) hypertension: Secondary | ICD-10-CM | POA: Diagnosis not present

## 2019-04-26 DIAGNOSIS — I739 Peripheral vascular disease, unspecified: Secondary | ICD-10-CM | POA: Diagnosis not present

## 2019-04-26 DIAGNOSIS — E782 Mixed hyperlipidemia: Secondary | ICD-10-CM | POA: Diagnosis not present

## 2019-04-26 DIAGNOSIS — J449 Chronic obstructive pulmonary disease, unspecified: Secondary | ICD-10-CM | POA: Diagnosis not present

## 2019-05-24 ENCOUNTER — Other Ambulatory Visit: Payer: Self-pay | Admitting: Interventional Cardiology

## 2019-06-22 DIAGNOSIS — M810 Age-related osteoporosis without current pathological fracture: Secondary | ICD-10-CM | POA: Diagnosis not present

## 2019-08-03 ENCOUNTER — Other Ambulatory Visit: Payer: Self-pay | Admitting: Physician Assistant

## 2019-08-03 DIAGNOSIS — Z23 Encounter for immunization: Secondary | ICD-10-CM | POA: Diagnosis not present

## 2019-08-12 ENCOUNTER — Other Ambulatory Visit: Payer: Self-pay | Admitting: Interventional Cardiology

## 2019-08-16 DIAGNOSIS — R1032 Left lower quadrant pain: Secondary | ICD-10-CM | POA: Diagnosis not present

## 2019-08-23 DIAGNOSIS — R319 Hematuria, unspecified: Secondary | ICD-10-CM | POA: Diagnosis not present

## 2019-08-23 DIAGNOSIS — R197 Diarrhea, unspecified: Secondary | ICD-10-CM | POA: Diagnosis not present

## 2019-08-24 ENCOUNTER — Other Ambulatory Visit: Payer: Self-pay

## 2019-08-24 ENCOUNTER — Ambulatory Visit
Admission: RE | Admit: 2019-08-24 | Discharge: 2019-08-24 | Disposition: A | Discharge status: Home / Self Care | Payer: Medicare Other | Source: Ambulatory Visit | Attending: Family Medicine | Admitting: Family Medicine

## 2019-08-24 ENCOUNTER — Other Ambulatory Visit: Payer: Medicare Other

## 2019-08-24 ENCOUNTER — Other Ambulatory Visit: Payer: Self-pay | Admitting: Family Medicine

## 2019-08-24 DIAGNOSIS — I77811 Abdominal aortic ectasia: Secondary | ICD-10-CM | POA: Diagnosis not present

## 2019-08-24 DIAGNOSIS — R1032 Left lower quadrant pain: Secondary | ICD-10-CM

## 2019-08-24 DIAGNOSIS — K5730 Diverticulosis of large intestine without perforation or abscess without bleeding: Secondary | ICD-10-CM | POA: Diagnosis not present

## 2019-08-24 MED ORDER — IOPAMIDOL (ISOVUE-300) INJECTION 61%
100.0000 mL | Freq: Once | INTRAVENOUS | Status: AC | PRN
  Administered 2019-08-24: 100 mL via INTRAVENOUS

## 2019-09-03 ENCOUNTER — Other Ambulatory Visit: Payer: Self-pay | Admitting: Interventional Cardiology

## 2019-09-04 NOTE — Progress Notes (Signed)
Cardiology Office Note   Date:  09/05/2019   ID:  [name], DOB 06-May-1930, MRN [id number]  PCP:  Sydney Neer, MD  Cardiologist:  Dr. Tamala Gross    Chief Complaint  Patient presents with  . Coronary Artery Disease  . Hypertension      History of Present Illness: Sydney Gross is a 85 y.o. female who presents for CAD  She has a hx of Supravalvular aortic stenosis,familial hypercholesterolemia with LDL cholesterol greater than 400 (Rapatha started}, PAD,hypertension, diastolic heart failure (stable), chronic dyspnea, and CAD with LAD DES 07/17/2018.Brilinta caused dyspnea.  After changing brilinta to plavix she still had residual shortness of breath and carries a diagnosis of COPD.  Likely current because of shortness of breath is intrinsic lung disease plus minus a component of diastolic heart failure.   Bilateral calf claudication. She is followed by Dr. Bridgett Gross though she did see Dr. Fletcher Gross once. She had been on pletal until cardiac stent 07/2018 then stopped due to DAPT.   Familial hypercholesterolemia.  She is on Repatha but there are no recent lipid values.  This is managed by her primary care physician, Dr. Serita Gross.  Last available LDL was 240 in Nov 2019. No longer on Repatha. Her prescription was lost per pt. There was a glitch she is intolerant to statins.    Her claudication has increased, she had her pletal stopped in 2019 though she tells [name] she is on 50 mg daily. She did call back and is not taking.   She has not seen Dr. Bridgett Gross recently.    She has no chest pain and her SOB is stable her inhalers resolve SOB. .  She has had her flu vaccine.  Recent labs with PCP were normal. Hgb 13.2, K+ 4.2, Cr 0.9 TSH 1.880    Past Medical History:  Diagnosis Date  . Allergic rhinitis   . Arthritis    "maybe a little in my knees and back" (07/17/2018)  . Chronic lower back pain   . Colitis 12/2013   "never had this before"  . COPD (chronic obstructive pulmonary  disease) ([place])    "questionable/Dr. Tamala Gross" (07/17/2018)  . Coronary artery disease   . Exertional shortness of breath    "off and on" (07/17/2018)  . Family history of anesthesia complication    "mother; think they gave her too much; c/o pain after OR; gave her more RX; they had to bring her back real quick"  . H/O calcium pyrophosphate deposition disease (CPPD)   . Heart murmur   . Hiatal hernia   . Hyperlipidemia   . Hypertension   . Numbness in feet   . On home oxygen therapy    "I've used it 2 times in 2 yrs" (07/17/2018)  . PAD (peripheral artery disease) ([place])   . Pneumonia    "1st grade & 4th grade" (07/17/2018)  . Proteinuria   . Shingles   . Stenosis of tear duct     Past Surgical History:  Procedure Laterality Date  . ABDOMINAL HYSTERECTOMY  1960's  . APPENDECTOMY    . BACK SURGERY    . BLEPHAROPLASTY Bilateral 05/2017  . CARDIAC CATHETERIZATION    . CATARACT EXTRACTION, BILATERAL Bilateral   . CESAREAN SECTION  1957; 1960  . CORONARY STENT INTERVENTION N/A 07/17/2018   Procedure: CORONARY STENT INTERVENTION;  Surgeon: Sydney Crome, MD;  Location: [place] CV LAB;  Service: Cardiovascular;  Laterality: N/A;  . DACROCYSTORHINOSTOMY W/ JONES TUBE Bilateral 05/2017   "  put tear duct in my eyes"  . EYE SURGERY    . LAPAROSCOPIC CHOLECYSTECTOMY  ~ 2009  . LUMBAR DISC SURGERY  ?2004  . RIGHT/LEFT HEART CATH AND CORONARY ANGIOGRAPHY N/A 07/17/2018   Procedure: RIGHT/LEFT HEART CATH AND CORONARY ANGIOGRAPHY;  Surgeon: Sydney Crome, MD;  Location: [facility] CV LAB;  Service: Cardiovascular;  Laterality: N/A;  . TONSILLECTOMY AND ADENOIDECTOMY  ?1940's     Current Outpatient Medications  Medication Sig Dispense Refill  . acetaminophen (TYLENOL) 500 MG tablet Take 2 tablets (1,000 mg total) by mouth 2 (two) times daily as needed (pain). 30 tablet 0  . albuterol (PROVENTIL) (2.5 MG/3ML) 0.083% nebulizer solution Take 3 mLs (2.5 mg total) by nebulization every 4 (four)  hours as needed for wheezing or shortness of breath. 75 mL 0  . aspirin EC 81 MG tablet Take 1 tablet (81 mg total) by mouth daily. 90 tablet 3  . budesonide-formoterol (SYMBICORT) 160-4.5 MCG/ACT inhaler Inhale 2 puffs into the lungs 2 (two) times daily.    . cholecalciferol (VITAMIN D) 1000 units tablet Take 1,000 Units by mouth at bedtime.    . Denosumab (PROLIA [place]) Inject 1 Dose as directed every 6 (six) months. Use as directed.     . diphenhydramine-acetaminophen (TYLENOL PM) 25-500 MG TABS Take 1-2 tablets by mouth at bedtime.     [name] lisinopril-hydrochlorothiazide (PRINZIDE,ZESTORETIC) 20-25 MG tablet Take 1 tablet by mouth at bedtime.     . metoprolol succinate (TOPROL-XL) 25 MG 24 hr tablet Take 1/2 (one-half) tablet by mouth once daily 45 tablet 0  . naproxen sodium (ALEVE) 220 MG tablet Take 220 mg by mouth daily as needed (hip pain).    . nitroGLYCERIN (NITROSTAT) 0.4 MG SL tablet Place 1 tablet (0.4 mg total) under the tongue every 5 (five) minutes as needed for chest pain. 25 tablet 3  . spironolactone (ALDACTONE) 25 MG tablet Take 1/2 (one-half) tablet by mouth once daily 45 tablet 0  . clopidogrel (PLAVIX) 75 MG tablet Take 1 tablet (75 mg total) by mouth daily. (Patient not taking: Reported on 09/05/2019) 90 tablet 3   No current facility-administered medications for this visit.     Allergies:   Brilinta [ticagrelor], Codeine, Crestor [rosuvastatin], Darvon [propoxyphene], Lipitor [atorvastatin], and Zocor [simvastatin]    Social History:  The patient  reports that she has never smoked. She has never used smokeless tobacco. She reports current alcohol use of about 4.0 standard drinks of alcohol per week. She reports that she does not use drugs.   Family History:  The patient's family history includes CVA in her mother; Cancer in her brother; Emphysema (age of onset: 2) in her father.    ROS:  General:no colds or fevers, no weight changes Skin:no rashes or ulcers HEENT:no  blurred vision, no congestion CV:see HPI PUL:see HPI GI:no diarrhea constipation or melena, no indigestion GU:no hematuria, no dysuria MS:no joint pain, + claudication has increased Neuro:no syncope, no lightheadedness Endo:no diabetes, no thyroid disease  Wt Readings from Last 3 Encounters:  09/05/19 116 lb 12.8 oz (53 kg)  01/29/19 111 lb (50.3 kg)  12/04/18 117 lb 11.6 oz (53.4 kg)     PHYSICAL EXAM: VS:  BP (!) 158/98   Pulse 76   Ht '4\' 9"'  (1.448 m)   Wt 116 lb 12.8 oz (53 kg)   BMI 25.28 kg/m  , BMI Body mass index is 25.28 kg/m. General:Pleasant affect, NAD Skin:Warm and dry, brisk capillary refill HEENT:normocephalic, sclera clear, mucus  membranes moist Neck:supple, no JVD, no bruits  Heart:S1S2 RRR occ premature beat with soft systolic murmur, no gallup, rub or click Lungs:clear without rales, rhonchi, or wheezes KAJ:GOTL, non tender, + BS, do not palpate liver spleen or masses Ext:no lower ext edema, 2+ pedal pulses, 2+ radial pulses Neuro:alert and oriented X 3, MAE, follows commands, + facial symmetry    EKG:  EKG is NOT ordered today.    Recent Labs: 01/29/2019: BUN 32; Creatinine, Ser 1.51; Hemoglobin 11.5; Platelets 229; Potassium 4.3; Sodium 140    Lipid Panel No results found for: CHOL, TRIG, HDL, CHOLHDL, VLDL, LDLCALC, LDLDIRECT     Other studies Reviewed: Additional studies/ records that were reviewed today include: . 2 D Doppler Echocardiogram 04/27/2017:  Study Conclusions  - Left ventricle: The cavity size was normal. Wall thickness was increased in a pattern of mild LVH. Systolic function was vigorous. The estimated ejection fraction was in the range of 65% to 70%. Wall motion was normal; there were no regional wall motion abnormalities. Features are consistent with a pseudonormal left ventricular filling pattern, with concomitant abnormal relaxation and increased filling pressure (grade 2 diastolic dysfunction). - Left  atrium: The atrium was moderately dilated. - Tricuspid valve: There was moderate regurgitation. - Pulmonary arteries: Systolic pressure was moderately increased. PA peak pressure: 41 mm Hg (S).  Cardiac cath 07/17/18  Coronary artery disease with high-grade obstruction in the mid LAD and proximal to mid first diagonal.  Normal left main.  Normal circumflex coronary artery.  Dominant RCA with luminal irregularities up to 40% distally.  Right heart pressures  Ventricular systolic function and LVEDP (14 mmHg).  Successful PCI and stent implantation in both the mid LAD and proximal to mid first diagonal.    The LAD 90% stenosis was reduced to 0% with TIMI grade III flow using a 2.5 x 28 Synergy postdilated to 2.75 mm in diameter.  TIMI grade III flow pre-and post stenting.  The first diagonal 90% stenosis was reduced to 0% using by 2.25 mm Synergy deployed at 14 atm which is not effective stent size of 2.32 mm.  TIMI grade III flow noted pre-and post PCI.  RECOMMENDATIONS:   Continue aggressive risk factor modification: PCSK9 therapy (Repatha), moderate aerobic physical activity, blood pressure control less than 140/90 mmHg, and surveillance of glycemic control.  Aspirin and Brilinta x1 month.  Switch to aspirin and Plavix thereafter.  Total duration of dual antiplatelet therapy 6 months at which time Plavix can be dropped.  Discharge in a.m.  Recommend uninterrupted dual antiplatelet therapy with Aspirin 61m daily and Ticagrelor 94mtwice daily for a minimum of 6 months (stable ischemic heart disease - Class I recommendation).  ASSESSMENT AND PLAN:  1.  CAD without angina last stent 07/18/19 with DES to mLAD and prox to mid first diag.  Per Dr. SmThompson Caulath note DAPT for 6 months then may stop plavix.  Will review with him.    2.  PAD - with increase of claudication. Will have her see Dr. ChBridgett Larssonith vascular- if we stop the plavix then we will resume pletal.    3.   familial  HLD has come off repatha - she needs to resume most recent LDL in Nov on Repatha was 240 down from >400.  Will have her seen in lipid clinid  4. Chronic diastolic HF euvolemic stable  5.  Aortic stenosis supravalvular. Last echo 2018 no change in symptoms.   Current medicines are reviewed with the patient today.  The patient Has no concerns regarding medicines.  The following changes have been made:  See above Labs/ tests ordered today include:see above  Disposition:   FU:  see above  Signed, Cecilie Kicks, NP  09/05/2019 8:44 PM    [facility] [address], [place], [place], [place] Phone: [phone]; Fax: [phone]

## 2019-09-05 ENCOUNTER — Telehealth: Payer: Self-pay | Admitting: *Deleted

## 2019-09-05 ENCOUNTER — Other Ambulatory Visit: Payer: Self-pay

## 2019-09-05 ENCOUNTER — Encounter: Payer: Self-pay | Admitting: Cardiology

## 2019-09-05 ENCOUNTER — Ambulatory Visit: Payer: Medicare Other | Admitting: Cardiology

## 2019-09-05 VITALS — BP 158/98 | HR 76 | Ht <= 58 in | Wt 116.8 lb

## 2019-09-05 DIAGNOSIS — E7801 Familial hypercholesterolemia: Secondary | ICD-10-CM

## 2019-09-05 DIAGNOSIS — I739 Peripheral vascular disease, unspecified: Secondary | ICD-10-CM

## 2019-09-05 DIAGNOSIS — I2510 Atherosclerotic heart disease of native coronary artery without angina pectoris: Secondary | ICD-10-CM | POA: Diagnosis not present

## 2019-09-05 DIAGNOSIS — I10 Essential (primary) hypertension: Secondary | ICD-10-CM

## 2019-09-05 DIAGNOSIS — Q253 Supravalvular aortic stenosis: Secondary | ICD-10-CM | POA: Diagnosis not present

## 2019-09-05 DIAGNOSIS — E78019 Familial hypercholesterolemia, unspecified: Secondary | ICD-10-CM

## 2019-09-05 DIAGNOSIS — I5032 Chronic diastolic (congestive) heart failure: Secondary | ICD-10-CM

## 2019-09-05 MED ORDER — CLOPIDOGREL BISULFATE 75 MG PO TABS: 75.0000 mg | ORAL_TABLET | Freq: Every day | ORAL | 3 refills | Status: DC

## 2019-09-05 NOTE — Telephone Encounter (Signed)
Pt called back with her bottles of medications. She is not taking the Pletal and says that she hadn't been taking it for a while.

## 2019-09-05 NOTE — Patient Instructions (Addendum)
Medication Instructions:  Your physician recommends that you continue on your current medications as directed. Please refer to the Current Medication list given to you today.  CALL Maly Lemarr W WHEN YOU GET HOME AND HAVE YOUR MEDICATION BOTTLES IN YOUR HAND.  [phone]  If you need a refill on your cardiac medications before your next appointment, please call your pharmacy.   Lab work: Friday 09/07/2019:  COME IN THE OFFICE FOR FASTING LIPID / LFT .  NOTHING TO EAT OR DRINK AFTER MIDNIGHT THE NIGHT BEFORE  If you have labs (blood work) drawn today and your tests are completely normal, you will receive your results only by: [name] MyChart Message (if you have MyChart) OR . A paper copy in the mail If you have any lab test that is abnormal or we need to change your treatment, we will call you to review the results.  Testing/Procedures: None ordered  You have been referred to [facility].  You need to call Dr. Lianne Moris office to make an appointment.   Follow-Up: At [facility], you and your health needs are our priority.  As part of our continuing mission to provide you with exceptional heart care, we have created designated Provider Care Teams.  These Care Teams include your primary Cardiologist (physician) and Advanced Practice Providers (APPs -  Physician Assistants and Nurse Practitioners) who all work together to provide you with the care you need, when you need it. You will need a follow up appointment in 6 months.  Please call our office 2 months in advance to schedule this appointment.  You may see Sinclair Grooms, MD or one of the following Advanced Practice Providers on your designated Care Team:   Truitt Merle, NP Cecilie Kicks, NP . Kathyrn Drown, NP  Any Other Special Instructions Will Be Listed Below (If Applicable).

## 2019-09-06 ENCOUNTER — Telehealth: Payer: Self-pay

## 2019-09-06 ENCOUNTER — Telehealth: Payer: Self-pay | Admitting: Interventional Cardiology

## 2019-09-06 DIAGNOSIS — K5730 Diverticulosis of large intestine without perforation or abscess without bleeding: Secondary | ICD-10-CM | POA: Diagnosis not present

## 2019-09-06 DIAGNOSIS — K5900 Constipation, unspecified: Secondary | ICD-10-CM | POA: Diagnosis not present

## 2019-09-06 DIAGNOSIS — R1032 Left lower quadrant pain: Secondary | ICD-10-CM | POA: Diagnosis not present

## 2019-09-06 MED ORDER — CILOSTAZOL 50 MG PO TABS: 50.0000 mg | ORAL_TABLET | Freq: Two times a day (BID) | ORAL | 3 refills | Status: DC

## 2019-09-06 MED ORDER — CLOPIDOGREL BISULFATE 75 MG PO TABS: 75.0000 mg | ORAL_TABLET | Freq: Every day | ORAL | 3 refills | Status: DC

## 2019-09-06 NOTE — Telephone Encounter (Signed)
Stop Pletal

## 2019-09-06 NOTE — Telephone Encounter (Signed)
I spoke to the patient's pharmacist who called to inform us of the bleeding risk being on Pletal and Plavix.  The Aspirin has been stopped, but the Pletal was resumed along with the Plavix, according to instructions.  Please advise, thank you.

## 2019-09-06 NOTE — Telephone Encounter (Signed)
I spoke to the patient and informed her of Dr Thompson Caul and Karilyn Cota recommendations.  She verbalized understanding.

## 2019-09-06 NOTE — Telephone Encounter (Signed)
  Pharmacist is calling regarding a drug interaction of a medication called in today by Cecilie Kicks. Patient is at the pharmacy now.

## 2019-09-06 NOTE — Telephone Encounter (Signed)
-----   Message from Isaiah Serge, NP sent at 09/06/2019  3:00 PM EDT ----- Please let pt know I talked with Dr. Tamala Julian and we recommend stop the asprin continue the plavix and start the pletal 50 mg BID. Since her claudication has increased.  Her heart stent is old enough to do this. And should help the pain in her legs.  She should still see Dr. [name].  ----- Message ----- From: Belva Crome, MD Sent: 09/06/2019  12:26 PM EDT To: Isaiah Serge, NP  Okay. No data.May need DC ASA continue Plavix and start Pltal but I dont have anyone on that combination. ----- Message ----- From: Isaiah Serge, NP Sent: 09/05/2019   8:46 PM EDT To: Belva Crome, MD  Saw ms Schlag, she has no angina but increase of claudication.  pletal stopped with DAPT, now 1 year out from stents, I plan to stop plavix and resume pletal.  Please give your opinion. Thanks.  Mickel Baas

## 2019-09-07 ENCOUNTER — Other Ambulatory Visit: Payer: Self-pay

## 2019-09-07 ENCOUNTER — Other Ambulatory Visit: Payer: Medicare Other | Admitting: *Deleted

## 2019-09-07 DIAGNOSIS — E7801 Familial hypercholesterolemia: Secondary | ICD-10-CM | POA: Diagnosis not present

## 2019-09-07 LAB — LIPID PANEL
Chol/HDL Ratio: 11.9 ratio — ABNORMAL HIGH (ref 0.0–4.4)
Cholesterol, Total: 452 mg/dL — ABNORMAL HIGH (ref 100–199)
HDL: 38 mg/dL — ABNORMAL LOW (ref >39)
LDL Chol Calc (NIH): 363 mg/dL — ABNORMAL HIGH (ref 0–99)
Triglycerides: 221 mg/dL — ABNORMAL HIGH (ref 0–149)
VLDL Cholesterol Cal: 51 mg/dL — ABNORMAL HIGH (ref 5–40)

## 2019-09-07 LAB — HEPATIC FUNCTION PANEL
ALT: 11 IU/L (ref 0–32)
AST: 17 IU/L (ref 0–40)
Albumin: 4.4 g/dL (ref 3.6–4.6)
Alkaline Phosphatase: 49 IU/L (ref 39–117)
Bilirubin Total: 0.3 mg/dL (ref 0.0–1.2)
Bilirubin, Direct: 0.06 mg/dL (ref 0.00–0.40)
Total Protein: 6.4 g/dL (ref 6.0–8.5)

## 2019-09-07 NOTE — Telephone Encounter (Signed)
I did discuss with Sydney Gross our pharmacist and she had recommended either ASA and pletal or Plavix and pletal.   Pt is to see Dr. [name] for her claudication.   So not sure what pt's pharmacist is seeing.

## 2019-09-07 NOTE — Telephone Encounter (Signed)
I spoke to the patient and informed her of Dr Thompson Caul recommendation to not restart Pletal.  She verbalized understanding.

## 2019-09-10 ENCOUNTER — Telehealth: Payer: Self-pay | Admitting: *Deleted

## 2019-09-10 NOTE — Telephone Encounter (Signed)
-----   Message from Isaiah Serge, NP sent at 09/09/2019 10:03 PM EDT ----- Cholesterol is higher off repatha.  She has appt with lipid clinic.  Please make sure she sees Dr. [name] for claudication. To clarify meds she should be on Plavix alone, no asprin and no pletal. thanks.

## 2019-09-11 NOTE — Progress Notes (Signed)
Patient ID: Sydney Gross                 DOB: June 27, 1930                    MRN: [id number]     HPI: Sydney Gross is a 85 y.o. female patient of Dr. Tamala Julian referred to lipid clinic by Cecilie Kicks, NP. PMH is significant for a hx of supravalvular aortic stenosis,familial hypercholesterolemia with baseline LDL cholesterol greater than 400(Repatha started), PAD (bilateral calf claudication),hypertension, diastolic heart failure (stable),chronic dyspnea, and CAD with LAD DES 07/17/2018. Of note, when she was on Brilinta she experienced dyspnea. After changing Brilinta to Plavix she still had residual shortness of breath and carries a diagnosis of COPD. Likely current because of shortness of breath is intrinsic lung disease plus minus a component of diastolic heart failure.  Pt presents today for initial lipid clinic appt. She states she was started on Repatha by Dr. Brigitte Pulse at [facility] in 2018 for her familial hypercholesterolemia, who "gave her a number to call to get Repatha without a charge," likely the [employer]. LDL improved to 240 on Repatha therapy, however she stopped taking Repatha a year ago, reporting that her "prescription ran out" and she wasn't able to afford it.   Pt states she had muscle pain to all statins she has tried. She is unsure of the dose of atorvastatin and simvastatin based on how she was on them "so long ago". She developed myalgias on rosuvastatin 66m daily as well. She cannot remember exact reason why she stopped colesevelam and thinks that she was not able to afford Zetia in the past. She is motivated to bring down cholesterol because she "doesn't want to have a stroke like her mom."   Of note, she has baseline leg pain secondary to her PAD and uses Tylenol daily to manage the pain.  Current Medications: none  Intolerances: colesevelam (doesn't remember), Repatha (cost), Zetia (cost), rosuvastatin 5 mg daily (myalgias), atorvastatin (myalgias), simvastatin  (myalgias) Risk Factors: familial hypercholesterolemia, extensive cardiac history (CAD s/p stent, PAD), family history LDL goal: < 70 mg/dL  Diet: eats about 1-2 meals/per day -Dinner: meat and vegetable  -Candy/cookies -Seltzer water, minimal caffeine  Exercise: cannot exercise (reports complete blockage in legs)  Family History: mother (CVA at age 83, brother (cancer), father (emphysema (age of onset: 617)  Social History: 1 glass of wine every other day   Labs: 09/07/19: TC 452 HDL 38 TG 221 VLDL 51 LDL 363 (no lipid lowering therapy)  Past Medical History:  Diagnosis Date  . Allergic rhinitis   . Arthritis    "maybe a little in my knees and back" (07/17/2018)  . Chronic lower back pain   . Colitis 12/2013   "never had this before"  . COPD (chronic obstructive pulmonary disease) ([facility]    "questionable/Dr. STamala Julian (07/17/2018)  . Coronary artery disease   . Exertional shortness of breath    "off and on" (07/17/2018)  . Family history of anesthesia complication    "mother; think they gave her too much; c/o pain after OR; gave her more RX; they had to bring her back real quick"  . H/O calcium pyrophosphate deposition disease (CPPD)   . Heart murmur   . Hiatal hernia   . Hyperlipidemia   . Hypertension   . Numbness in feet   . On home oxygen therapy    "I've used it 2 times in 2 yrs" (07/17/2018)  .  PAD (peripheral artery disease) ([place])   . Pneumonia    "1st grade & 4th grade" (07/17/2018)  . Proteinuria   . Shingles   . Stenosis of tear duct     Current Outpatient Medications on File Prior to Visit  Medication Sig Dispense Refill  . acetaminophen (TYLENOL) 500 MG tablet Take 2 tablets (1,000 mg total) by mouth 2 (two) times daily as needed (pain). 30 tablet 0  . albuterol (PROVENTIL) (2.5 MG/3ML) 0.083% nebulizer solution Take 3 mLs (2.5 mg total) by nebulization every 4 (four) hours as needed for wheezing or shortness of breath. 75 mL 0  . budesonide-formoterol  (SYMBICORT) 160-4.5 MCG/ACT inhaler Inhale 2 puffs into the lungs 2 (two) times daily.    . cholecalciferol (VITAMIN D) 1000 units tablet Take 1,000 Units by mouth at bedtime.    . clopidogrel (PLAVIX) 75 MG tablet Take 1 tablet (75 mg total) by mouth daily. 90 tablet 3  . Denosumab (PROLIA [place]) Inject 1 Dose as directed every 6 (six) months. Use as directed.     . diphenhydramine-acetaminophen (TYLENOL PM) 25-500 MG TABS Take 1-2 tablets by mouth at bedtime.     [name] lisinopril-hydrochlorothiazide (PRINZIDE,ZESTORETIC) 20-25 MG tablet Take 1 tablet by mouth at bedtime.     . metoprolol succinate (TOPROL-XL) 25 MG 24 hr tablet Take 1/2 (one-half) tablet by mouth once daily 45 tablet 0  . naproxen sodium (ALEVE) 220 MG tablet Take 220 mg by mouth daily as needed (hip pain).    . nitroGLYCERIN (NITROSTAT) 0.4 MG SL tablet Place 1 tablet (0.4 mg total) under the tongue every 5 (five) minutes as needed for chest pain. 25 tablet 3  . spironolactone (ALDACTONE) 25 MG tablet Take 1/2 (one-half) tablet by mouth once daily 45 tablet 0   No current facility-administered medications on file prior to visit.     Allergies  Allergen Reactions  . Brilinta [Ticagrelor] Shortness Of Breath  . Codeine Nausea And Vomiting  . Crestor [Rosuvastatin] Other (See Comments)    MYALGIAS  . Darvon [Propoxyphene] Nausea Only  . Lipitor [Atorvastatin] Other (See Comments)    MYALGIAS  . Zocor [Simvastatin] Other (See Comments)    MYALGIAS    Assessment/Plan:  1. Hyperlipidemia - Patient's baseline LDL is very high at 363, far above goal < 70 mg/dL due to HeFH and ASCVD. Re-start Repatha (lower LDL by 60%) and initiate Nexlizet (lower LDL by 40%). Copays are not affordable for patient, will submit application for [employer]. Will call patient once prior authorizations and grant are approved. Will plan to recheck lipids and CMET in 2-3 months.  Thank you for involving pharmacy to assist in providing Ms.  Booze's care.   Drexel Iha, PharmD PGY2 Ambulatory Care Pharmacy Resident  [employer] Evolocumab [id number] Site No: [phone] Subject ID No: [id number]     Did subject meet all eligibility criteria?  Code  No Yes  15  Adults age ? 40 years _0  _1   One or both of the following:   102 A  Hospitalization for a clinical ASCVD event: acute (MI), UA, IS, or CLI within 18 months of enrollment        Note: Subjects must have been admitted to the hospital. Those       who are admitted and discharged in less than 24 hours are      Eligible for the study.Subjects who have been admitted to     The ER for a clinical ASCVD. _2  _3   102B  Coronary or peripheral revascularization including percutaneous or surgical revascularization in the past 18 months _0  _1   One of the following:  103 A  LDL ? 70 mg/dL (1.81 mmol/L) with no plans for immediate initiation or titration of statin therapy _2  _3   103 B Newly started on PCSK9i after the index hospitalization/procedure and prior to enrollment (but no more than 6 months prior to enrollment) with pre-PCSK9i treatment LDL-C value available and measured within 6 months of starting PCSK9i and known background LLT any time prior to PCSK9i initiation. _4  _5   105 Planned follow-up within the health system _6  _7   Subject does not meet any of the following exclusion criteria   201 Unable or unwilling to provide informed consent, including but not limited to cognitive or language barriers (reading or comprehension) _8  _9   202 Lack of phone or email for contact _10  _11   203 Evidence of end stage renal disease (ESRD) or stage 5 CKD _12  _13   204 Anticipated life expectancy less than 6 month  _14  _15   206  On a PCSK9i prior to their qualifying event _16  _17      Subject Name: NECIA KAMM  Subject met inclusion and exclusion criteria.  The informed consent form, study requirements and expectations were reviewed with the subject and questions and concerns were addressed  prior to the signing of the consent form.  The subject verbalized understanding of the trial requirements.  The subject agreed to participate in the [place] trial and signed the informed consent at 3:00pm on10/09/20.  The informed consent was obtained prior to performance of any protocol-specific procedures for the subject.  A copy of the signed informed consent was given to the subject and a copy was placed in the subject's medical record.   Megan Supple   Socioeconomic Status - Baseline  Education Some High School or Less High School graduate Some College/University Graduated College/University or above _18  _19  _20  _21    Marital Status Married Single Divorced Separated _22  _23  _24  _25    Income <$15,000 $15,001 - $34,999 $35,000 - $74,999 $75,000 - $99,999 >$100,000 _26  _27  _28  _29  _30

## 2019-09-14 ENCOUNTER — Other Ambulatory Visit: Payer: Self-pay

## 2019-09-14 ENCOUNTER — Ambulatory Visit (INDEPENDENT_AMBULATORY_CARE_PROVIDER_SITE_OTHER): Payer: Medicare Other | Admitting: Pharmacist

## 2019-09-14 DIAGNOSIS — G720 Drug-induced myopathy: Secondary | ICD-10-CM

## 2019-09-14 DIAGNOSIS — T466X5A Adverse effect of antihyperlipidemic and antiarteriosclerotic drugs, initial encounter: Secondary | ICD-10-CM

## 2019-09-14 DIAGNOSIS — E782 Mixed hyperlipidemia: Secondary | ICD-10-CM

## 2019-09-14 DIAGNOSIS — E785 Hyperlipidemia, unspecified: Secondary | ICD-10-CM | POA: Insufficient documentation

## 2019-09-14 NOTE — Patient Instructions (Addendum)
It was a pleasure seeing you in clinic today Ms. Lehn!  Your lab results from 09/07/19 show the following: Total Cholesterol 452 HDL 38 (good cholesterol) Triglyercerides 221 VLDL 51 LDL 363 (bad cholesterol)  Today the plan is...  1. Please make sure you do not take more than 4 grams or 4000 mg of Tylenol per day  2. Alondria Mousseau (pharmacist) will call you today to get more information to apply to [employer].  3. After you talk to [facility] later, we will send in prescriptions for you to start (evolocumab) Repatha 140 mg subQ every 2 weeks and (bempedoic acid/ezetimibe) Nexlizet 180 mg/10 mg one tablet by mouth daily. Refer to [place] handout if you have any questions and feel free to reach out if the handout does not cover your questions.  4. Based on how your triglyceride level was high please try to be considerate of how much alcohol, fried foods, and sweets that you are eating  Please call the PharmD clinic at [phone] if you have any questions that you would like to speak with a pharmacist about [facility], [place], [place]).  :)

## 2019-09-17 ENCOUNTER — Encounter: Payer: Self-pay | Admitting: Pharmacist

## 2019-09-17 ENCOUNTER — Telehealth: Payer: Self-pay

## 2019-09-17 MED ORDER — REPATHA SURECLICK 140 MG/ML ~~LOC~~ SOAJ: 1.0000 "pen " | SUBCUTANEOUS | 11 refills | Status: DC

## 2019-09-17 MED ORDER — NEXLIZET 180-10 MG PO TABS: 1.0000 | ORAL_TABLET | Freq: Every day | ORAL | 11 refills | Status: DC

## 2019-09-17 NOTE — Telephone Encounter (Signed)
Letter received from [facility] stating that they approved coverage of the pts Nexlizet. Approval good until 09/13/2020  I did notify [facility] of this approval and was advised that this medication has to be ordered and will be in store by tomorrow.

## 2019-09-18 ENCOUNTER — Telehealth: Payer: Self-pay | Admitting: Interventional Cardiology

## 2019-09-18 DIAGNOSIS — E782 Mixed hyperlipidemia: Secondary | ICD-10-CM

## 2019-09-18 NOTE — Telephone Encounter (Signed)
Returned call to pharmacy, gave them copay information again, they stated they couldn't run it yesterday until rx was in stock. Able to run card today, confirmed $0 copay for Repatha and Nexlizet. Pt is aware and f/u labs have been scheduled.

## 2019-09-18 NOTE — Telephone Encounter (Signed)
New message  Sydney Gross from [facility] states that patient has a coupon that is for Evolocumab (REPATHA SURECLICK) 801 MG/ML SOAJ it does not work please call to discuss.

## 2019-09-20 NOTE — Telephone Encounter (Signed)
This encounter was created in error - please disregard.

## 2019-09-26 ENCOUNTER — Telehealth (HOSPITAL_COMMUNITY): Payer: Self-pay | Admitting: *Deleted

## 2019-09-26 ENCOUNTER — Other Ambulatory Visit: Payer: Self-pay

## 2019-09-26 DIAGNOSIS — I70219 Atherosclerosis of native arteries of extremities with intermittent claudication, unspecified extremity: Secondary | ICD-10-CM

## 2019-09-26 NOTE — Telephone Encounter (Signed)

## 2019-09-27 ENCOUNTER — Ambulatory Visit (HOSPITAL_COMMUNITY)
Admission: RE | Admit: 2019-09-27 | Discharge: 2019-09-27 | Disposition: A | Discharge status: Home / Self Care | Payer: Medicare Other | Source: Ambulatory Visit | Attending: Family | Admitting: Family

## 2019-09-27 ENCOUNTER — Encounter: Payer: Self-pay | Admitting: Family

## 2019-09-27 ENCOUNTER — Ambulatory Visit (INDEPENDENT_AMBULATORY_CARE_PROVIDER_SITE_OTHER): Payer: Medicare Other | Admitting: Family

## 2019-09-27 ENCOUNTER — Telehealth: Payer: Self-pay | Admitting: *Deleted

## 2019-09-27 ENCOUNTER — Other Ambulatory Visit: Payer: Self-pay

## 2019-09-27 VITALS — BP 154/79 | HR 63 | Temp 97.8°F | Resp 16 | Ht 59.0 in | Wt 116.0 lb

## 2019-09-27 DIAGNOSIS — I70219 Atherosclerosis of native arteries of extremities with intermittent claudication, unspecified extremity: Secondary | ICD-10-CM | POA: Insufficient documentation

## 2019-09-27 DIAGNOSIS — I70213 Atherosclerosis of native arteries of extremities with intermittent claudication, bilateral legs: Secondary | ICD-10-CM

## 2019-09-27 NOTE — Progress Notes (Signed)
Virtual Visit via Telephone Note   I connected with Sydney Gross on 09/27/2019 using the [employer] by telephone and verified that I was speaking with the correct person using two identifiers. Patient was located at her home and accompanied by herself. I am located at the [facility] office/clinic.   The limitations of evaluation and management by telemedicine and the availability of in person appointments have been previously discussed with the patient and are documented in the patients chart. The patient expressed understanding and consented to proceed.  PCP: Lupita Raider, MD  Chief Complaint: follow up intermittent claudication   History of Present Illness: Sydney Gross is a 85 y.o. female whom Dr. Imogene Burn had been monitoring for intermittent claudication.  After walking about 75-80 feet her left calf hurts, resolves with a short rest; this has worsened from walking 1/2 mile before she claudicated.  She no longer takes cilostizol 100 mg bid to help with claudication, states she has occasional diarrhea.  She was exercising about 45 minutes most days of the week in a gym and walking about 5 minutes on a track, and wishes that she could do this now.  Recently she started having pain in her feet with walking, she denies wounds in her feet or legs.  New development:    She states her daughters have Raynaud's Syndrome in hands and feet.  She any history of stroke or TIA, denies any cardiac problems or MI.  She has not had previous peripheral vascular intervention.  She has COPD, states her father smoked, then her husband smoked for the first 5 years of their marriage, she never smoked. She states that she has shortness of breath from her COPD, which is no worse than usual. She denies chest pain.   Diabetic: No Tobacco use: non-smoker  Pt meds include: Statin :No, statins cause myalgias. She takes Repatha  Betablocker: Yes ASA: no Other anticoagulants/antiplatelets: Plavix   Past Medical History:  Diagnosis Date  . Allergic rhinitis   . Arthritis    "maybe a little in my knees and back" (07/17/2018)  . Chronic lower back pain   . Colitis 12/2013   "never had this before"  . COPD (chronic obstructive pulmonary disease) ([facility])    "questionable/Dr. Katrinka Blazing" (07/17/2018)  . Coronary artery disease   . Exertional shortness of breath    "off and on" (07/17/2018)  . Family history of anesthesia complication    "mother; think they gave her too much; c/o pain after OR; gave her more RX; they had to bring her back real quick"  . H/O calcium pyrophosphate deposition disease (CPPD)   . Heart murmur   . Hiatal hernia   . Hyperlipidemia   . Hypertension   . Numbness in feet   . On home oxygen therapy    "I've used it 2 times in 2 yrs" (07/17/2018)  . PAD (peripheral artery disease) ([facility])   . Pneumonia    "1st grade & 4th grade" (07/17/2018)  . Proteinuria   . Shingles   . Stenosis of tear duct     Past Surgical History:  Procedure Laterality Date  . ABDOMINAL HYSTERECTOMY  1960's  . APPENDECTOMY    . BACK SURGERY    . BLEPHAROPLASTY Bilateral 05/2017  . CARDIAC CATHETERIZATION    . CATARACT EXTRACTION, BILATERAL Bilateral   . CESAREAN SECTION  1957; 1960  . CORONARY STENT INTERVENTION N/A 07/17/2018   Procedure: CORONARY STENT INTERVENTION;  Surgeon: Lyn Records, MD;  Location:  [facility];  Service: Cardiovascular;  Laterality: N/A;  . DACROCYSTORHINOSTOMY W/ JONES TUBE Bilateral 05/2017   "put tear duct in my eyes"  . EYE SURGERY    . LAPAROSCOPIC CHOLECYSTECTOMY  ~ 2009  . LUMBAR DISC SURGERY  ?2004  . RIGHT/LEFT HEART CATH AND CORONARY ANGIOGRAPHY N/A 07/17/2018   Procedure: RIGHT/LEFT HEART CATH AND CORONARY ANGIOGRAPHY;  Surgeon: Lyn RecordsSmith, Henry W, MD;  Location: [facility];  Service: Cardiovascular;  Laterality: N/A;  . TONSILLECTOMY AND ADENOIDECTOMY  ?1940's    Current Meds  Medication Sig  . acetaminophen (TYLENOL) 500 MG tablet  Take 2 tablets (1,000 mg total) by mouth 2 (two) times daily as needed (pain).  [name]. albuterol (PROVENTIL) (2.5 MG/3ML) 0.083% nebulizer solution Take 3 mLs (2.5 mg total) by nebulization every 4 (four) hours as needed for wheezing or shortness of breath.  . Bempedoic Acid-Ezetimibe (NEXLIZET) 180-10 MG TABS Take 1 tablet by mouth daily.  . budesonide-formoterol (SYMBICORT) 160-4.5 MCG/ACT inhaler Inhale 2 puffs into the lungs 2 (two) times daily.  . cholecalciferol (VITAMIN D) 1000 units tablet Take 1,000 Units by mouth at bedtime.  . clopidogrel (PLAVIX) 75 MG tablet Take 1 tablet (75 mg total) by mouth daily.  . Denosumab (PROLIA [place]) Inject 1 Dose as directed every 6 (six) months. Use as directed.   . diphenhydramine-acetaminophen (TYLENOL PM) 25-500 MG TABS Take 1-2 tablets by mouth at bedtime.   . Evolocumab (REPATHA SURECLICK) 140 MG/ML SOAJ Inject 1 pen into the skin every 14 (fourteen) days.  [name]. lisinopril-hydrochlorothiazide (PRINZIDE,ZESTORETIC) 20-25 MG tablet Take 1 tablet by mouth at bedtime.   . metoprolol succinate (TOPROL-XL) 25 MG 24 hr tablet Take 1/2 (one-half) tablet by mouth once daily  . naproxen sodium (ALEVE) 220 MG tablet Take 220 mg by mouth daily as needed (hip pain).  [name]. spironolactone (ALDACTONE) 25 MG tablet Take 1/2 (one-half) tablet by mouth once daily    12 system ROS was negative unless otherwise noted in HPI   Observations/Objective:  DATA  ABI Findings: +---------+------------------+-----+----------+--------+ Right    Rt Pressure (mmHg)IndexWaveform  Comment  +---------+------------------+-----+----------+--------+ Brachial 157                                       +---------+------------------+-----+----------+--------+ PTA      111               0.71 biphasic           +---------+------------------+-----+----------+--------+ DP       115               0.73 monophasic         +---------+------------------+-----+----------+--------+  Great Toe78                0.50 Abnormal           +---------+------------------+-----+----------+--------+  +---------+------------------+-----+----------+-------+ Left     Lt Pressure (mmHg)IndexWaveform  Comment +---------+------------------+-----+----------+-------+ Brachial 152                                      +---------+------------------+-----+----------+-------+ PTA      89                0.57 monophasic        +---------+------------------+-----+----------+-------+ DP       88  0.56 monophasic        +---------+------------------+-----+----------+-------+ Great Toe60                0.38 Abnormal          +---------+------------------+-----+----------+-------+  +-------+-----------+-----------+--------------+------------+ ABI/TBIToday's ABIToday's TBIPrevious ABI  Previous TBI +-------+-----------+-----------+--------------+------------+ Right  0.73       0.50       0.83 [place]0.41         +-------+-----------+-----------+--------------+------------+ Left   0.57       0.35       0.80 [place]0.45         +-------+-----------+-----------+--------------+------------+  Summary: Right: Resting right ankle-brachial index indicates moderate right lower extremity arterial disease. The right toe-brachial index is abnormal.  Left: Resting left ankle-brachial index indicates moderate left lower extremity arterial disease. The left toe-brachial index is abnormal.  Decreased bilateral ABI since exam 06/03/2018. Previous exam 08/2017 [facility]) revealed a right ABI of 0.77 and a left ABI of 0.76     Assessment and Plan: 85 year old female with left calf claudication at 75-80 feet, resolves with a minute of rest. Her walking distance has decreased from 1/2 mile before she had claudication in her calves.  Recently her feet have started to hurt with walking. She could not continue to take the cilostizol due to  diarrhea.  She denies any wounds in her lower extremities.  She makes the effort to walk daily however.   I encouraged her to gradually increase her walking frequency in a safe environment and rest when needed.   Follow Up Instructions:   Follow up 3 months with ABI's.    I discussed the assessment and treatment plan with the patient. The patient was provided an opportunity to ask questions and all were answered. The patient agreed with the plan and demonstrated an understanding of the instructions.   The patient was advised to call back or seek an in-person evaluation if the symptoms worsen or if the condition fails to improve as anticipated.  I spent 11 minutes with the patient via telephone encounter.   Gabrielle Dare Javonn Gauger [facility] Office: [phone]  09/27/2019, 5:14 PM

## 2019-09-27 NOTE — Telephone Encounter (Signed)
Virtual Visit Pre-Appointment Phone Call  Today, I spoke with Sydney Gross and performed the following actions:  1. I explained that we are currently trying to limit exposure to the COVID-19 virus by seeing patients at home rather than in the office.  I explained that the visits are best done by video, but can be done by telephone.  I asked the patient if a virtual visit that the patient would like to try instead of coming into the office. Sydney Gross agreed to proceed with the virtual visit scheduled with [name] on 09/27/19.     I confirmed the BEST phone number to call the day of the visit and- I included this in appointment notes.  2. I asked if the patient had access to (through a family member/friend) a smartphone with video capability to be used for her visit?"  The patient said yes -      3. I confirmed consent by  a. sending through [place] or by email the [facility] as written at the end of this message or  b. verbally as listed below. i. This visit is being performed in the setting of COVID-19. ii. All virtual visits are billed to your insurance company just like a normal visit would be.   iii. We'd like you to understand that the technology does not allow for your provider to perform an examination, and thus may limit your provider's ability to fully assess your condition.  iv. If your provider identifies any concerns that need to be evaluated in person, we will make arrangements to do so.   v. Finally, though the technology is pretty good, we cannot assure that it will always work on either your or our end, and in the setting of a video visit, we may have to convert it to a phone-only visit.  In either situation, we cannot ensure that we have a secure connection.   vi. Are you willing to proceed?"  STAFF: Did the patient verbally acknowledge consent to telehealth visit? Document YES/NO here: YES  2. I advised the patient to  be prepared - I asked that the patient, on the day of her visit, record any information possible with the equipment at her home, such as blood pressure, pulse, oxygen saturation, and your weight and write them all down. I asked the patient to have a pen and paper handy nearby the day of the visit as well.  3. If the patient was scheduled for a video visit, I informed the patient that the visit with the doctor would start with a text to the smartphone # given to Sydney Gross by the patient.         If the patient was scheduled for a telephone call, I informed the patient that the visit with the doctor would start with a call to the telephone # given to Sydney Gross by the patient.  4. I Informed patient they will receive a phone call 15 minutes prior to their appointment time from a [place] or nurse to review medications, allergies, etc. to prepare for the visit.    TELEPHONE CALL NOTE  GALILEAH PIGGEE has been deemed a candidate for a follow-up tele-health visit to limit community exposure during the Covid-19 pandemic. I spoke with the patient via phone to ensure availability of phone/video source, confirm preferred email & phone number, and discuss instructions and expectations.  I reminded Sydney Gross to be prepared with any vital sign  and/or heart rhythm information that could potentially be obtained via home monitoring, at the time of her visit. I reminded HARMONII Gross to expect a phone call prior to her visit.  Rudi Coco, NT 09/27/2019 3:27 PM     FULL LENGTH CONSENT FOR TELE-HEALTH VISIT   I hereby voluntarily request, consent and authorize [facility] and its employed or contracted physicians, physician assistants, nurse practitioners or other licensed health care professionals (the Practitioner), to provide me with telemedicine health care services (the "Services") as deemed necessary by the treating Practitioner. I acknowledge and consent to receive the Services by the Practitioner via  telemedicine. I understand that the telemedicine visit will involve communicating with the Practitioner through live audiovisual communication technology and the disclosure of certain medical information by electronic transmission. I acknowledge that I have been given the opportunity to request an in-person assessment or other available alternative prior to the telemedicine visit and am voluntarily participating in the telemedicine visit.  I understand that I have the right to withhold or withdraw my consent to the use of telemedicine in the course of my care at any time, without affecting my right to future care or treatment, and that the Practitioner or I may terminate the telemedicine visit at any time. I understand that I have the right to inspect all information obtained and/or recorded in the course of the telemedicine visit and may receive copies of available information for a reasonable fee.  I understand that some of the potential risks of receiving the Services via telemedicine include:  [name] Delay or interruption in medical evaluation due to technological equipment failure or disruption; . Information transmitted may not be sufficient (e.g. poor resolution of images) to allow for appropriate medical decision making by the Practitioner; and/or  . In rare instances, security protocols could fail, causing a breach of personal health information.  Furthermore, I acknowledge that it is my responsibility to provide information about my medical history, conditions and care that is complete and accurate to the best of my ability. I acknowledge that Practitioner's advice, recommendations, and/or decision may be based on factors not within their control, such as incomplete or inaccurate data provided by me or distortions of diagnostic images or specimens that may result from electronic transmissions. I understand that the practice of medicine is not an exact science and that Practitioner makes no warranties or  guarantees regarding treatment outcomes. I acknowledge that I will receive a copy of this consent concurrently upon execution via email to the email address I last provided but may also request a printed copy by calling the office of [facility].    I understand that my insurance will be billed for this visit.   I have read or had this consent read to me. . I understand the contents of this consent, which adequately explains the benefits and risks of the Services being provided via telemedicine.  . I have been provided ample opportunity to ask questions regarding this consent and the Services and have had my questions answered to my satisfaction. . I give my informed consent for the services to be provided through the use of telemedicine in my medical care  By participating in this telemedicine visit I agree to the above.

## 2019-09-27 NOTE — Patient Instructions (Signed)
Intermittent Claudication Intermittent claudication is pain in one or both legs that occurs when walking or exercising and goes away when resting. Intermittent claudication is a symptom of peripheral arterial disease (PAD). This condition is commonly treated with rest, medicine, and healthy lifestyle changes. If medical management does not improve symptoms, surgery can be done to restore blood flow (revascularization) to the affected leg. What are the causes?  This condition is caused by buildup of fatty material (plaque) within the major arteries in the body (atherosclerosis). Plaque makes arteries stiff and narrow, which prevents enough blood from reaching the leg muscles. Pain occurs when you walk or exercise because your muscles need (but cannot get) more blood when you are moving and exercising. What increases the risk? The following factors may make you more likely to develop this condition:  A family history of atherosclerosis.  A personal history of stroke or heart disease.  Older age.  Being inactive (sedentary lifestyle).  Being overweight.  Smoking cigarettes.  Having another health condition such as: ? Diabetes. ? High blood pressure. ? High cholesterol. What are the signs or symptoms? Symptoms of this condition may first develop in the lower leg, and then they may spread to the thigh, hip, buttock, or the back of the lower leg (calf) over time. Symptoms may include:  Aches or pains.  Cramps.  A feeling of tightness, weakness, or heaviness.  A wound on the lower leg or foot that heals poorly or does not heal. How is this diagnosed? This condition may be diagnosed based on:  Your symptoms.  Your medical history.  Tests, such as: ? Blood tests. ? Arterial duplex ultrasound. This test uses images of blood vessels and surrounding organs to evaluate blood flow within arteries. ? Angiogram. In this procedure, dye is injected into arteries and then X-rays are taken.  ? Magnetic resonance angiogram (MRA). In this procedure, strong magnets and radio waves are used instead of X-rays to create images of blood vessels and blood flow. ? CT angiogram (CTA). In this procedure, a large X-ray machine called a CT scanner takes detailed pictures of blood vessels that have been injected with dye. ? Ankle-brachial index (ABI) test. This procedure measures blood pressure in the leg during exercise and at rest. ? Exercise test. For this test, you will walk on a treadmill while tests are done (such as the ABI test) to evaluate how this condition affects your ability to walk or exercise. How is this treated? Treatment for this condition may involve treatment for the underlying cause, such as treatment for high blood pressure, high cholesterol, or diabetes. Treatment may include:  Lifestyle changes such as: ? Starting a supervised or home-based exercise program. ? Losing weight. ? Quitting smoking.  Medicines to help restore blood flow through your legs.  Blood vessel surgery (angioplasty) to restore blood flow around the blocked vessel. This is also known as endovascular therapy (EVT). This is only done if your intermittent claudication is caused by severe peripheral artery disease, a condition in which blood flow is severely or totally restricted by the narrowing of the arteries. Follow these instructions at home: Lifestyle   Maintain a healthy weight.  Eat a diet that is low in saturated fats and calories. Consider working with a diet and nutrition specialist (dietitian) to help you make healthy food choices.  Do not use any products that contain nicotine or tobacco, such as cigarettes and e-cigarettes. If you need help quitting, ask your health care provider.  If   your health care provider recommended an exercise program for you, follow it as directed. Your exercise program may involve: ? Walking 3 or more times a week. ? Walking until you have certain symptoms of  intermittent claudication. ? Resting until symptoms go away. ? Gradually increasing your walking time to about 50 minutes a day. General instructions  Work with your health care provider to manage any other health conditions you may have, including diabetes, high blood pressure, or high cholesterol.  Take over-the-counter and prescription medicines only as told by your health care provider.  Keep all follow-up visits as told by your health care provider. This is important. Contact a health care provider if:  Your pain does not go away with rest.  You have sores on your legs that do not heal or have a bad smell or pus coming from them.  Your condition gets worse or does not get better with treatment. Get help right away if:  You have chest pain.  You have difficulty breathing.  You develop arm weakness.  You have trouble speaking.  Your face begins to droop.  Your foot or leg is cold or it changes color.  Your foot or leg becomes numb. These symptoms may represent a serious problem that is an emergency. Do not wait to see if the symptoms will go away. Get medical help right away. Call your local emergency services (911 in the [place] Do not drive yourself to the hospital.  Summary  Intermittent claudication is pain in one or both legs that occurs when walking or exercising and goes away when resting.  This condition is caused by buildup of fatty material (plaque) within the major arteries in the body (atherosclerosis). Plaque makes arteries stiff and narrow, which prevents enough blood from reaching the leg muscles.  Intermittent claudication can be treated with medicine and lifestyle changes. If medical treatment fails, surgery can be done to help return blood flow to the affected area.  Make sure you work with your health care provider to manage any other health conditions you may have, including diabetes, high blood pressure, or high cholesterol. This information is not  intended to replace advice given to you by your health care provider. Make sure you discuss any questions you have with your health care provider. Document Released: 09/24/2004 Document Revised: 11/04/2017 Document Reviewed: 12/23/2016 Elsevier Patient Education  2020 [employer]  

## 2019-11-07 ENCOUNTER — Other Ambulatory Visit: Payer: Self-pay | Admitting: Interventional Cardiology

## 2019-11-12 DIAGNOSIS — E559 Vitamin D deficiency, unspecified: Secondary | ICD-10-CM | POA: Diagnosis not present

## 2019-11-12 DIAGNOSIS — E782 Mixed hyperlipidemia: Secondary | ICD-10-CM | POA: Diagnosis not present

## 2019-11-12 DIAGNOSIS — I10 Essential (primary) hypertension: Secondary | ICD-10-CM | POA: Diagnosis not present

## 2019-11-15 DIAGNOSIS — J449 Chronic obstructive pulmonary disease, unspecified: Secondary | ICD-10-CM | POA: Diagnosis not present

## 2019-11-15 DIAGNOSIS — Z0000 Encounter for general adult medical examination without abnormal findings: Secondary | ICD-10-CM | POA: Diagnosis not present

## 2019-11-15 DIAGNOSIS — D692 Other nonthrombocytopenic purpura: Secondary | ICD-10-CM | POA: Diagnosis not present

## 2019-11-15 DIAGNOSIS — I739 Peripheral vascular disease, unspecified: Secondary | ICD-10-CM | POA: Diagnosis not present

## 2019-12-19 ENCOUNTER — Other Ambulatory Visit: Payer: Medicare Other | Admitting: *Deleted

## 2019-12-19 ENCOUNTER — Other Ambulatory Visit: Payer: Self-pay

## 2019-12-19 ENCOUNTER — Telehealth: Payer: Self-pay | Admitting: Pharmacist

## 2019-12-19 ENCOUNTER — Other Ambulatory Visit: Payer: Medicare Other

## 2019-12-19 DIAGNOSIS — E782 Mixed hyperlipidemia: Secondary | ICD-10-CM

## 2019-12-19 DIAGNOSIS — I70219 Atherosclerosis of native arteries of extremities with intermittent claudication, unspecified extremity: Secondary | ICD-10-CM

## 2019-12-19 LAB — HEPATIC FUNCTION PANEL
ALT: 9 IU/L (ref 0–32)
AST: 16 IU/L (ref 0–40)
Albumin: 4.4 g/dL (ref 3.6–4.6)
Alkaline Phosphatase: 63 IU/L (ref 39–117)
Bilirubin Total: 0.3 mg/dL (ref 0.0–1.2)
Bilirubin, Direct: 0.11 mg/dL (ref 0.00–0.40)
Total Protein: 6 g/dL (ref 6.0–8.5)

## 2019-12-19 LAB — LIPID PANEL
Chol/HDL Ratio: 5.1 ratio — ABNORMAL HIGH (ref 0.0–4.4)
Cholesterol, Total: 226 mg/dL — ABNORMAL HIGH (ref 100–199)
HDL: 44 mg/dL (ref >39)
LDL Chol Calc (NIH): 163 mg/dL — ABNORMAL HIGH (ref 0–99)
Triglycerides: 104 mg/dL (ref 0–149)
VLDL Cholesterol Cal: 19 mg/dL (ref 5–40)

## 2019-12-19 MED ORDER — EZETIMIBE 10 MG PO TABS: 10.0000 mg | ORAL_TABLET | Freq: Every day | ORAL | 3 refills | Status: DC

## 2019-12-19 NOTE — Telephone Encounter (Signed)
LDL has dropped notably from 363 to 163 which is a 55% reduction from baseline. This is less than expected after prescribing both Repatha (expect 50-60% reduction) and Nexlizet (expect another 40% reduction). Called pt who states she is not taking Nexlizet. States she experienced muscle pain after 2 doses. Will try adding just Zetia component (previously took this but stopped d/t cost) and will call patient in a few weeks to make sure she is tolerating therapy well before scheduling follow up labs.

## 2019-12-20 DIAGNOSIS — S80811A Abrasion, right lower leg, initial encounter: Secondary | ICD-10-CM | POA: Diagnosis not present

## 2019-12-26 ENCOUNTER — Ambulatory Visit: Payer: Medicare Other | Attending: Internal Medicine

## 2019-12-26 DIAGNOSIS — Z23 Encounter for immunization: Secondary | ICD-10-CM | POA: Insufficient documentation

## 2019-12-26 NOTE — Progress Notes (Signed)
   Covid-19 Vaccination Clinic  Name:  TERIANA DANKER    MRN: [id number] DOB: [birth year]  12/26/2019  Ms. Mach was observed post Covid-19 immunization for 15 minutes without incidence. She was provided with Vaccine Information Sheet and instruction to access the V-Safe system.   Ms. Pokorney was instructed to call 911 with any severe reactions post vaccine: [name] Difficulty breathing  . Swelling of your face and throat  . A fast heartbeat  . A bad rash all over your body  . Dizziness and weakness    Immunizations Administered    Name Date Dose VIS Date Route   Pfizer COVID-19 Vaccine 12/26/2019  9:41 AM 0.3 mL 11/16/2019 Intramuscular   Manufacturer: [employer], [employer]   Lot: [id number]   NDC: [address]-7

## 2019-12-31 DIAGNOSIS — L98499 Non-pressure chronic ulcer of skin of other sites with unspecified severity: Secondary | ICD-10-CM | POA: Diagnosis not present

## 2019-12-31 DIAGNOSIS — B958 Unspecified staphylococcus as the cause of diseases classified elsewhere: Secondary | ICD-10-CM | POA: Diagnosis not present

## 2019-12-31 DIAGNOSIS — Z79899 Other long term (current) drug therapy: Secondary | ICD-10-CM | POA: Diagnosis not present

## 2020-01-02 NOTE — Addendum Note (Signed)
Addended by: SUPPLE, MEGAN E on: 01/02/2020 11:23 AM   Modules accepted: Orders

## 2020-01-02 NOTE — Telephone Encounter (Signed)
Called pt, she is tolerating Zetia well without the Nexletol component. Still taking Repatha injections as well. Advised her to continue on both medications and will recheck fasting labs 3/31 the same day she sees Dr Katrinka Blazing for follow up.

## 2020-01-10 DIAGNOSIS — A4902 Methicillin resistant Staphylococcus aureus infection, unspecified site: Secondary | ICD-10-CM | POA: Diagnosis not present

## 2020-01-13 ENCOUNTER — Ambulatory Visit: Payer: Medicare Other | Attending: Internal Medicine

## 2020-01-13 DIAGNOSIS — Z23 Encounter for immunization: Secondary | ICD-10-CM

## 2020-01-13 NOTE — Progress Notes (Signed)
   Covid-19 Vaccination Clinic  Name:  Sydney Gross    MRN: [id number] DOB: June 03, 1930  01/13/2020  Ms. Sydney Gross was observed post Covid-19 immunization for 15 minutes without incidence. She was provided with Vaccine Information Sheet and instruction to access the V-Safe system.   Ms. Sydney Gross was instructed to call 911 with any severe reactions post vaccine: [name] Difficulty breathing  . Swelling of your face and throat  . A fast heartbeat  . A bad rash all over your body  . Dizziness and weakness    Immunizations Administered    Name Date Dose VIS Date Route   Pfizer COVID-19 Vaccine 01/13/2020  1:07 PM 0.3 mL 11/16/2019 Intramuscular   Manufacturer: [employer], [employer]   Lot: [id number]   NDC: [address]-6

## 2020-03-05 ENCOUNTER — Other Ambulatory Visit: Payer: Self-pay

## 2020-03-05 ENCOUNTER — Other Ambulatory Visit: Payer: Medicare Other | Admitting: *Deleted

## 2020-03-05 ENCOUNTER — Ambulatory Visit: Payer: Medicare Other | Admitting: Interventional Cardiology

## 2020-03-05 DIAGNOSIS — I70219 Atherosclerosis of native arteries of extremities with intermittent claudication, unspecified extremity: Secondary | ICD-10-CM

## 2020-03-05 LAB — HEPATIC FUNCTION PANEL
ALT: 10 IU/L (ref 0–32)
AST: 19 IU/L (ref 0–40)
Albumin: 4.5 g/dL (ref 3.6–4.6)
Alkaline Phosphatase: 61 IU/L (ref 39–117)
Bilirubin Total: 0.4 mg/dL (ref 0.0–1.2)
Bilirubin, Direct: 0.12 mg/dL (ref 0.00–0.40)
Total Protein: 6.3 g/dL (ref 6.0–8.5)

## 2020-03-05 LAB — LIPID PANEL
Chol/HDL Ratio: 4.3 ratio (ref 0.0–4.4)
Cholesterol, Total: 186 mg/dL (ref 100–199)
HDL: 43 mg/dL (ref >39)
LDL Chol Calc (NIH): 110 mg/dL — ABNORMAL HIGH (ref 0–99)
Triglycerides: 188 mg/dL — ABNORMAL HIGH (ref 0–149)
VLDL Cholesterol Cal: 33 mg/dL (ref 5–40)

## 2020-03-06 ENCOUNTER — Encounter: Payer: Self-pay | Admitting: Pharmacist

## 2020-03-06 ENCOUNTER — Telehealth: Payer: Self-pay | Admitting: Pharmacist

## 2020-03-06 NOTE — Telephone Encounter (Signed)
Excellent reduction in LDL from baseline of 363 to 163 after adding Repatha, now down to 110 after adding Zetia. Pt is intolerant to 3 statins and bempedoic acid. Unlikely that we will be able to bring her LDL to goal < 70 since her baseline was so elevated. Spoke with pt who is excited about her lipid panel results. She does not wish to rechallenge with another statin at this time. Will continue Repatha and Zetia as she has shown a 70% reduction in LDL from her baseline.

## 2020-03-18 NOTE — Progress Notes (Signed)
Cardiology Office Note:    Date:  03/19/2020   ID:  VALOREE AGENT, DOB [birth year], MRN [id number]  PCP:  Lupita Raider, MD  Cardiologist:  Lesleigh Noe, MD   Referring MD: Lupita Raider, MD   Chief Complaint  Patient presents with  . Coronary Artery Disease  . Claudication  . Shortness of Breath    History of Present Illness:    Sydney Gross is a 86 y.o. female with a hx of CAD with prior stents, hyperlipidemia, hypertension, and claudication.  Ms. Shinault has been shut in due to the pandemic.  She now notices that her exertional tolerance has significantly deteriorated.  She cannot walk very far without feeling short of breath.  Her legs also get heavy and hurt when she walks.  Doppler evaluation reveal bilateral lower extremity reduction in blood flow.  She is not having angina.  She has not needed to use nitroglycerin.  She denies orthopnea and PND.  She feels her exertional dyspnea is related to COPD.  Past Medical History:  Diagnosis Date  . Allergic rhinitis   . Arthritis    "maybe a little in my knees and back" (07/17/2018)  . Chronic lower back pain   . Colitis 12/2013   "never had this before"  . COPD (chronic obstructive pulmonary disease) (HCC)    "questionable/Dr. Katrinka Blazing" (07/17/2018)  . Coronary artery disease   . Exertional shortness of breath    "off and on" (07/17/2018)  . Family history of anesthesia complication    "mother; think they gave her too much; c/o pain after OR; gave her more RX; they had to bring her back real quick"  . H/O calcium pyrophosphate deposition disease (CPPD)   . Heart murmur   . Hiatal hernia   . Hyperlipidemia   . Hypertension   . Numbness in feet   . On home oxygen therapy    "I've used it 2 times in 2 yrs" (07/17/2018)  . PAD (peripheral artery disease) (HCC)   . Pneumonia    "1st grade & 4th grade" (07/17/2018)  . Proteinuria   . Shingles   . Stenosis of tear duct     Past Surgical History:  Procedure  Laterality Date  . ABDOMINAL HYSTERECTOMY  1960's  . APPENDECTOMY    . BACK SURGERY    . BLEPHAROPLASTY Bilateral 05/2017  . CARDIAC CATHETERIZATION    . CATARACT EXTRACTION, BILATERAL Bilateral   . CESAREAN SECTION  1957; 1960  . CORONARY STENT INTERVENTION N/A 07/17/2018   Procedure: CORONARY STENT INTERVENTION;  Surgeon: Lyn Records, MD;  Location: [facility] INVASIVE CV LAB;  Service: Cardiovascular;  Laterality: N/A;  . DACROCYSTORHINOSTOMY W/ JONES TUBE Bilateral 05/2017   "put tear duct in my eyes"  . EYE SURGERY    . LAPAROSCOPIC CHOLECYSTECTOMY  ~ 2009  . LUMBAR DISC SURGERY  ?2004  . RIGHT/LEFT HEART CATH AND CORONARY ANGIOGRAPHY N/A 07/17/2018   Procedure: RIGHT/LEFT HEART CATH AND CORONARY ANGIOGRAPHY;  Surgeon: Lyn Records, MD;  Location: [facility] INVASIVE CV LAB;  Service: Cardiovascular;  Laterality: N/A;  . TONSILLECTOMY AND ADENOIDECTOMY  ?1940's    Current Medications: Current Meds  Medication Sig  . acetaminophen (TYLENOL) 500 MG tablet Take 2 tablets (1,000 mg total) by mouth 2 (two) times daily as needed (pain).  [name] albuterol (PROVENTIL) (2.5 MG/3ML) 0.083% nebulizer solution Take 3 mLs (2.5 mg total) by nebulization every 4 (four) hours as needed for wheezing or shortness of breath.  [name]  budesonide-formoterol (SYMBICORT) 160-4.5 MCG/ACT inhaler Inhale 2 puffs into the lungs 2 (two) times daily.  . cholecalciferol (VITAMIN D) 1000 units tablet Take 1,000 Units by mouth at bedtime.  . clopidogrel (PLAVIX) 75 MG tablet Take 1 tablet (75 mg total) by mouth daily.  . Denosumab (PROLIA [place]) Inject 1 Dose as directed every 6 (six) months. Use as directed.   . diphenhydramine-acetaminophen (TYLENOL PM) 25-500 MG TABS Take 1-2 tablets by mouth at bedtime.   . Evolocumab (REPATHA SURECLICK) 140 MG/ML SOAJ Inject 1 pen into the skin every 14 (fourteen) days.  [name] ezetimibe (ZETIA) 10 MG tablet Take 1 tablet (10 mg total) by mouth daily.  [name] lisinopril-hydrochlorothiazide  (PRINZIDE,ZESTORETIC) 20-25 MG tablet Take 1 tablet by mouth at bedtime.   . metoprolol succinate (TOPROL-XL) 25 MG 24 hr tablet Take 1/2 (one-half) tablet by mouth once daily  . naproxen sodium (ALEVE) 220 MG tablet Take 220 mg by mouth daily as needed (hip pain).  . nitroGLYCERIN (NITROSTAT) 0.4 MG SL tablet Place 1 tablet (0.4 mg total) under the tongue every 5 (five) minutes as needed for chest pain.  [name] spironolactone (ALDACTONE) 25 MG tablet Take 1/2 (one-half) tablet by mouth once daily     Allergies:   Brilinta [ticagrelor], Codeine, Crestor [rosuvastatin], Darvon [propoxyphene], Lipitor [atorvastatin], Zocor [simvastatin], and Nexlizet [bempedoic acid-ezetimibe]   Social History   Socioeconomic History  . Marital status: Married    Spouse name: Delton See  . Number of children: Not on file  . Years of education: 56  . Highest education level: High school graduate  Occupational History  . Occupation: Retired  Tobacco Use  . Smoking status: Never Smoker  . Smokeless tobacco: Never Used  Substance and Sexual Activity  . Alcohol use: Yes    Alcohol/week: 4.0 standard drinks    Types: 4 Glasses of wine per week    Comment: glass of wine every other night  . Drug use: Never  . Sexual activity: Not Currently  Other Topics Concern  . Not on file  Social History Narrative  . Not on file   Social Determinants of Health   Financial Resource Strain:   . Difficulty of Paying Living Expenses:   Food Insecurity:   . Worried About Programme researcher, broadcasting/film/video in the Last Year:   . Barista in the Last Year:   Transportation Needs:   . Freight forwarder (Medical):   [name] Lack of Transportation (Non-Medical):   Physical Activity:   . Days of Exercise per Week:   . Minutes of Exercise per Session:   Stress:   . Feeling of Stress :   Social Connections:   . Frequency of Communication with Friends and Family:   . Frequency of Social Gatherings with Friends and Family:   . Attends  Religious Services:   . Active Member of Clubs or Organizations:   . Attends Banker Meetings:   [name] Marital Status:      Family History: The patient's family history includes CVA in her mother; Cancer in her brother; Emphysema (age of onset: 3) in her father.  ROS:   Please see the history of present illness.    She is still active.  She drives independently.  She has not been going to the gym due to the pandemic.  She has received the vaccine.  All other systems reviewed and are negative.  EKGs/Labs/Other Studies Reviewed:    The following studies were reviewed today: September 27, 2019  and lower extremity Doppler study: Summary:  Right: Resting right ankle-brachial index indicates moderate right lower  extremity arterial disease. The right toe-brachial index is abnormal.   Left: Resting left ankle-brachial index indicates moderate left lower  extremity arterial disease. The left toe-brachial index is abnormal.   EKG:  EKG sinus rhythm with premature atrial contractions.  Otherwise normal.  Recent Labs: 03/05/2020: ALT 10  Recent Lipid Panel    Component Value Date/Time   CHOL 186 03/05/2020 0920   TRIG 188 (H) 03/05/2020 0920   HDL 43 03/05/2020 0920   CHOLHDL 4.3 03/05/2020 0920   LDLCALC 110 (H) 03/05/2020 0920    Physical Exam:    VS:  BP (!) 168/88   Pulse 65   Ht 4\' 11"  (1.499 m)   Wt 117 lb 12.8 oz (53.4 kg)   SpO2 99%   BMI 23.79 kg/m     Wt Readings from Last 3 Encounters:  03/19/20 117 lb 12.8 oz (53.4 kg)  09/27/19 116 lb (52.6 kg)  09/05/19 116 lb 12.8 oz (53 kg)     GEN: Compatible with age or slightly younger in appearance. No acute distress HEENT: Normal NECK: No JVD. LYMPHATICS: No lymphadenopathy CARDIAC:  RRR without murmur, gallop, or edema. VASCULAR: Diminished pedal pulses bilaterally.  No bruits. RESPIRATORY:  Clear to auscultation without rales, wheezing or rhonchi  ABDOMEN: Soft, non-tender, non-distended, No pulsatile  mass, MUSCULOSKELETAL: No deformity  SKIN: Warm and dry NEUROLOGIC:  Alert and oriented x 3 PSYCHIATRIC:  Normal affect   ASSESSMENT:    1. Mixed hyperlipidemia   2. Coronary artery disease involving native coronary artery of native heart without angina pectoris   3. Atherosclerosis of native artery of lower extremity with intermittent claudication, unspecified laterality (HCC)   4. Statin myopathy   5. Essential hypertension   6. Dilated aortic root ([place])   7. Educated about COVID-19 virus infection    PLAN:    In order of problems listed above:  1. Please see most recent lipid panel.  She is currently receiving Repatha.  LDL is less than 100.  This is down from greater than 240.  She is unable to tolerate statin therapy.  She is also on Zetia which will be continued. 2. Secondary prevention is discussed as outlined below. 3. Walking and secondary prevention is discussed. 4. Not currently on statin therapy due to myopathy.  5. Elevated blood pressure.  Low-salt diet recommended.  May need to uptitrate antihypertensive therapy.  Norvasc would be a great agent given PAD. 6. Continue beta-blocker therapy and ACE inhibitor. 7. COVID-19 vaccine, masking, and social distancing are being practiced.  Overall education and awareness concerning primary/secondary risk prevention was discussed in detail: LDL less than 70, hemoglobin A1c less than 7, blood pressure target less than 130/80 mmHg, >150 minutes of moderate aerobic activity per week, avoidance of smoking, weight control (via diet and exercise), and continued surveillance/management of/for obstructive sleep apnea.     Medication Adjustments/Labs and Tests Ordered: Current medicines are reviewed at length with the patient today.  Concerns regarding medicines are outlined above.  Orders Placed This Encounter  Procedures  . EKG 12-Lead   No orders of the defined types were placed in this encounter.   There are no Patient  Instructions on file for this visit.   Signed, Sinclair Grooms, MD  03/19/2020 3:54 PM    [place]

## 2020-03-19 ENCOUNTER — Encounter: Payer: Self-pay | Admitting: Interventional Cardiology

## 2020-03-19 ENCOUNTER — Ambulatory Visit: Payer: Medicare Other | Admitting: Interventional Cardiology

## 2020-03-19 ENCOUNTER — Other Ambulatory Visit: Payer: Self-pay | Admitting: *Deleted

## 2020-03-19 ENCOUNTER — Other Ambulatory Visit: Payer: Self-pay

## 2020-03-19 VITALS — BP 168/88 | HR 65 | Ht 59.0 in | Wt 117.8 lb

## 2020-03-19 DIAGNOSIS — I70219 Atherosclerosis of native arteries of extremities with intermittent claudication, unspecified extremity: Secondary | ICD-10-CM

## 2020-03-19 DIAGNOSIS — I2510 Atherosclerotic heart disease of native coronary artery without angina pectoris: Secondary | ICD-10-CM | POA: Diagnosis not present

## 2020-03-19 DIAGNOSIS — E782 Mixed hyperlipidemia: Secondary | ICD-10-CM

## 2020-03-19 DIAGNOSIS — G720 Drug-induced myopathy: Secondary | ICD-10-CM | POA: Diagnosis not present

## 2020-03-19 DIAGNOSIS — I10 Essential (primary) hypertension: Secondary | ICD-10-CM

## 2020-03-19 DIAGNOSIS — T466X5A Adverse effect of antihyperlipidemic and antiarteriosclerotic drugs, initial encounter: Secondary | ICD-10-CM

## 2020-03-19 DIAGNOSIS — I70213 Atherosclerosis of native arteries of extremities with intermittent claudication, bilateral legs: Secondary | ICD-10-CM

## 2020-03-19 DIAGNOSIS — I77810 Thoracic aortic ectasia: Secondary | ICD-10-CM

## 2020-03-19 DIAGNOSIS — Z7189 Other specified counseling: Secondary | ICD-10-CM

## 2020-03-19 NOTE — Patient Instructions (Signed)
Medication Instructions:  Your physician recommends that you continue on your current medications as directed. Please refer to the Current Medication list given to you today.  *If you need a refill on your cardiac medications before your next appointment, please call your pharmacy*   Lab Work: None If you have labs (blood work) drawn today and your tests are completely normal, you will receive your results only by: . MyChart Message (if you have MyChart) OR . A paper copy in the mail If you have any lab test that is abnormal or we need to change your treatment, we will call you to review the results.   Testing/Procedures: None   Follow-Up: At [facility], you and your health needs are our priority.  As part of our continuing mission to provide you with exceptional heart care, we have created designated Provider Care Teams.  These Care Teams include your primary Cardiologist (physician) and Advanced Practice Providers (APPs -  Physician Assistants and Nurse Practitioners) who all work together to provide you with the care you need, when you need it.  We recommend signing up for the patient portal called "MyChart".  Sign up information is provided on this After Visit Summary.  MyChart is used to connect with patients for Virtual Visits (Telemedicine).  Patients are able to view lab/test results, encounter notes, upcoming appointments, etc.  Non-urgent messages can be sent to your provider as well.   To learn more about what you can do with MyChart, go to [email or web address].    Your next appointment:   9-12 month(s)  The format for your next appointment:   In Person  Provider:   You may see [name], MD or one of the following Advanced Practice Providers on your designated Care Team:    Lori Gerhardt, NP  Laura Ingold, NP  Jill McDaniel, NP    Other Instructions   

## 2020-03-24 DIAGNOSIS — M810 Age-related osteoporosis without current pathological fracture: Secondary | ICD-10-CM | POA: Diagnosis not present

## 2020-03-27 ENCOUNTER — Encounter (HOSPITAL_COMMUNITY): Payer: Medicare Other

## 2020-03-27 ENCOUNTER — Ambulatory Visit: Payer: Medicare Other

## 2020-04-07 ENCOUNTER — Telehealth (HOSPITAL_COMMUNITY): Payer: Self-pay

## 2020-04-07 NOTE — Telephone Encounter (Signed)

## 2020-04-08 ENCOUNTER — Ambulatory Visit: Payer: Medicare Other | Admitting: Physician Assistant

## 2020-04-08 ENCOUNTER — Ambulatory Visit (HOSPITAL_COMMUNITY)
Admission: RE | Admit: 2020-04-08 | Discharge: 2020-04-08 | Disposition: A | Discharge status: Home / Self Care | Payer: Medicare Other | Source: Ambulatory Visit | Attending: Vascular Surgery | Admitting: Vascular Surgery

## 2020-04-08 ENCOUNTER — Other Ambulatory Visit: Payer: Self-pay

## 2020-04-08 VITALS — BP 130/73 | HR 57 | Temp 97.0°F | Resp 16 | Ht 59.0 in | Wt 116.0 lb

## 2020-04-08 DIAGNOSIS — I70213 Atherosclerosis of native arteries of extremities with intermittent claudication, bilateral legs: Secondary | ICD-10-CM | POA: Insufficient documentation

## 2020-04-08 NOTE — Progress Notes (Signed)
History of Present Illness:  Patient is a 86 y.o. year old female who presents for evaluation of claudication.  She has a history of claudication left > right LE.  She denise non healing wounds and rest pain.    She tries to exercise at home on a stationary bike and walking as much as she can.     Pt meds include: Statin :No, statins cause myalgias. She takes Repatha  Betablocker: Yes ASA: no Other anticoagulants/antiplatelets:Plavix   Past Medical History:  Diagnosis Date  . Allergic rhinitis   . Arthritis    "maybe a little in my knees and back" (07/17/2018)  . Chronic lower back pain   . Colitis 12/2013   "never had this before"  . COPD (chronic obstructive pulmonary disease) ([place])    "questionable/Dr. Tamala Julian" (07/17/2018)  . Coronary artery disease   . Exertional shortness of breath    "off and on" (07/17/2018)  . Family history of anesthesia complication    "mother; think they gave her too much; c/o pain after OR; gave her more RX; they had to bring her back real quick"  . H/O calcium pyrophosphate deposition disease (CPPD)   . Heart murmur   . Hiatal hernia   . Hyperlipidemia   . Hypertension   . Numbness in feet   . On home oxygen therapy    "I've used it 2 times in 2 yrs" (07/17/2018)  . PAD (peripheral artery disease) ([place])   . Pneumonia    "1st grade & 4th grade" (07/17/2018)  . Proteinuria   . Shingles   . Stenosis of tear duct     Past Surgical History:  Procedure Laterality Date  . ABDOMINAL HYSTERECTOMY  1960's  . APPENDECTOMY    . BACK SURGERY    . BLEPHAROPLASTY Bilateral 05/2017  . CARDIAC CATHETERIZATION    . CATARACT EXTRACTION, BILATERAL Bilateral   . CESAREAN SECTION  1957; 1960  . CORONARY STENT INTERVENTION N/A 07/17/2018   Procedure: CORONARY STENT INTERVENTION;  Surgeon: Belva Crome, MD;  Location: [facility] CV LAB;  Service: Cardiovascular;  Laterality: N/A;  . DACROCYSTORHINOSTOMY W/ JONES TUBE Bilateral 05/2017   "put tear duct  in my eyes"  . EYE SURGERY    . LAPAROSCOPIC CHOLECYSTECTOMY  ~ 2009  . LUMBAR DISC SURGERY  ?2004  . RIGHT/LEFT HEART CATH AND CORONARY ANGIOGRAPHY N/A 07/17/2018   Procedure: RIGHT/LEFT HEART CATH AND CORONARY ANGIOGRAPHY;  Surgeon: Belva Crome, MD;  Location: [facility] CV LAB;  Service: Cardiovascular;  Laterality: N/A;  . TONSILLECTOMY AND ADENOIDECTOMY  ?1940's    ROS:   General:  No weight loss, Fever, chills  HEENT: No recent headaches, no nasal bleeding, no visual changes, no sore throat  Neurologic: No dizziness, blackouts, seizures. No recent symptoms of stroke or mini- stroke. No recent episodes of slurred speech, or temporary blindness.  Cardiac: No recent episodes of chest pain/pressure, no shortness of breath at rest.  No shortness of breath with exertion.  Denies history of atrial fibrillation or irregular heartbeat  Vascular: No history of rest pain in feet.  No history of claudication.  No history of non-healing ulcer, No history of DVT   Pulmonary: No home oxygen, no productive cough, no hemoptysis,  No asthma or wheezing [x] COPD  Musculoskeletal:  [ ]  Arthritis, [ ]  Low back pain,  [ ]  Joint pain  Hematologic:No history of hypercoagulable state.  No history of easy bleeding.  No history of anemia  Gastrointestinal: No hematochezia or melena,  No gastroesophageal reflux, no trouble swallowing  Urinary: [ ]  chronic Kidney disease, [ ]  on HD - [ ]  MWF or [ ]  TTHS, [ ]  Burning with urination, [ ]  Frequent urination, [ ]  Difficulty urinating;   Skin: No rashes  Psychological: No history of anxiety,  No history of depression  Social History Social History   Tobacco Use  . Smoking status: Never Smoker  . Smokeless tobacco: Never Used  Substance Use Topics  . Alcohol use: Yes    Alcohol/week: 4.0 standard drinks    Types: 4 Glasses of wine per week    Comment: glass of wine every other night  . Drug use: Never    Family History Family History  Problem  Relation Age of Onset  . Emphysema Father 44  . CVA Mother   . Cancer Brother        ? type    Allergies  Allergies  Allergen Reactions  . Brilinta [Ticagrelor] Shortness Of Breath  . Codeine Nausea And Vomiting  . Crestor [Rosuvastatin] Other (See Comments)    MYALGIAS  . Darvon [Propoxyphene] Nausea Only  . Lipitor [Atorvastatin] Other (See Comments)    MYALGIAS  . Zocor [Simvastatin] Other (See Comments)    MYALGIAS  . Nexlizet [Bempedoic Acid-Ezetimibe]     Muscle aches (tolerates Zetia just fine, allergy is with bempedoic acid component)     Current Outpatient Medications  Medication Sig Dispense Refill  . acetaminophen (TYLENOL) 500 MG tablet Take 2 tablets (1,000 mg total) by mouth 2 (two) times daily as needed (pain). 30 tablet 0  . albuterol (PROVENTIL) (2.5 MG/3ML) 0.083% nebulizer solution Take 3 mLs (2.5 mg total) by nebulization every 4 (four) hours as needed for wheezing or shortness of breath. 75 mL 0  . budesonide-formoterol (SYMBICORT) 160-4.5 MCG/ACT inhaler Inhale 2 puffs into the lungs 2 (two) times daily.    . cholecalciferol (VITAMIN D) 1000 units tablet Take 1,000 Units by mouth at bedtime.    . clopidogrel (PLAVIX) 75 MG tablet Take 1 tablet (75 mg total) by mouth daily. 90 tablet 3  . Denosumab (PROLIA [place]) Inject 1 Dose as directed every 6 (six) months. Use as directed.     . diphenhydramine-acetaminophen (TYLENOL PM) 25-500 MG TABS Take 1-2 tablets by mouth at bedtime.     . Evolocumab (REPATHA SURECLICK) 140 MG/ML SOAJ Inject 1 pen into the skin every 14 (fourteen) days. 2 pen 11  . lisinopril-hydrochlorothiazide (PRINZIDE,ZESTORETIC) 20-25 MG tablet Take 1 tablet by mouth at bedtime.     . metoprolol succinate (TOPROL-XL) 25 MG 24 hr tablet Take 1/2 (one-half) tablet by mouth once daily 45 tablet 3  . naproxen sodium (ALEVE) 220 MG tablet Take 220 mg by mouth daily as needed (hip pain).    spironolactone (ALDACTONE) 25 MG tablet Take 1/2 (one-half)  tablet by mouth once daily 45 tablet 3  . ezetimibe (ZETIA) 10 MG tablet Take 1 tablet (10 mg total) by mouth daily. 30 tablet 3  . nitroGLYCERIN (NITROSTAT) 0.4 MG SL tablet Place 1 tablet (0.4 mg total) under the tongue every 5 (five) minutes as needed for chest pain. 25 tablet 3   No current facility-administered medications for this visit.    Physical Examination  Vitals:   04/08/20 1520  BP: 130/73  Pulse: (!) 57  Resp: 16  Temp: (!) 97 F (36.1 C)  TempSrc: Temporal  SpO2: 99%  Weight: 116 lb (52.6 kg)  Height:  4\' 11"  (1.499 m)    Body mass index is 23.43 kg/m.  General:  Alert and oriented, no acute distress HEENT: Normal Neck: No bruit or JVD Pulmonary: Clear to auscultation bilaterally Cardiac: Regular Rate and Rhythm without murmur Abdomen: Soft, non-tender, non-distended, no mass, no scars Skin: No rash Extremity Pulses:  2+ radial, brachial, femoral, dorsalis pedis, posterior tibial pulses bilaterally Musculoskeletal: No deformity or edema  Neurologic: Upper and lower extremity motor 5/5 and symmetric  DATA:    ABI Findings:  +---------+------------------+-----+----------+--------+  Right  Rt Pressure (mmHg)IndexWaveform Comment   +---------+------------------+-----+----------+--------+  Brachial 162                      +---------+------------------+-----+----------+--------+  ATA   98        0.60            +---------+------------------+-----+----------+--------+  PTA   94        0.58 monophasic      +---------+------------------+-----+----------+--------+  DP                monophasic      +---------+------------------+-----+----------+--------+  Great Toe58        0.36            +---------+------------------+-----+----------+--------+   +---------+------------------+-----+----------+-------+  Left   Lt Pressure  (mmHg)IndexWaveform Comment  +---------+------------------+-----+----------+-------+  Brachial 159                      +---------+------------------+-----+----------+-------+  ATA   65        0.40            +---------+------------------+-----+----------+-------+  PTA   23        0.14 monophasic      +---------+------------------+-----+----------+-------+  DP                monophasic      +---------+------------------+-----+----------+-------+  Great Toe38        0.23            +---------+------------------+-----+----------+-------+   +-------+-----------+-----------+------------+------------+  ABI/TBIToday's ABIToday's TBIPrevious ABIPrevious TBI  +-------+-----------+-----------+------------+------------+  Right 0.60    0.36    0.73    0.50      +-------+-----------+-----------+------------+------------+  Left  0.40    0.23    0.57    0.38      +-------+-----------+-----------+------------+------------+     Bilateral ABIs appear decreased compared to prior study on 09/27/2019.    Summary:  Right: Resting right ankle-brachial index indicates moderate right lower  extremity arterial disease. The right toe-brachial index is abnormal. RT  great toe pressure = 58 mmHg.   Left: Resting left ankle-brachial index indicates severe left lower  extremity arterial disease. The left toe-brachial index is abnormal. LT  Great toe pressure = 38 mmHg.   ASSESSMENT: PAD with history of claudication and no other symptoms.   PLAN: We talked about continued exercise as she tolerates.  If she develops intolerable pain, or a non healing wound she will call 09/29/2019.  Otherwise she will return for f/u in 6-9 months for repeat ABI's.     [name] PA-C [facility] Office: [phone]  MD in  clinic [place]

## 2020-04-09 ENCOUNTER — Other Ambulatory Visit: Payer: Self-pay | Admitting: *Deleted

## 2020-04-09 DIAGNOSIS — I70213 Atherosclerosis of native arteries of extremities with intermittent claudication, bilateral legs: Secondary | ICD-10-CM

## 2020-04-24 ENCOUNTER — Other Ambulatory Visit: Payer: Self-pay | Admitting: Interventional Cardiology

## 2020-05-13 DIAGNOSIS — D692 Other nonthrombocytopenic purpura: Secondary | ICD-10-CM | POA: Diagnosis not present

## 2020-05-13 DIAGNOSIS — J449 Chronic obstructive pulmonary disease, unspecified: Secondary | ICD-10-CM | POA: Diagnosis not present

## 2020-05-13 DIAGNOSIS — I739 Peripheral vascular disease, unspecified: Secondary | ICD-10-CM | POA: Diagnosis not present

## 2020-05-13 DIAGNOSIS — I2510 Atherosclerotic heart disease of native coronary artery without angina pectoris: Secondary | ICD-10-CM | POA: Diagnosis not present

## 2020-05-28 DIAGNOSIS — M79672 Pain in left foot: Secondary | ICD-10-CM | POA: Diagnosis not present

## 2020-08-15 ENCOUNTER — Other Ambulatory Visit: Payer: Self-pay | Admitting: Interventional Cardiology

## 2020-08-18 MED ORDER — REPATHA SURECLICK 140 MG/ML ~~LOC~~ SOAJ: SUBCUTANEOUS | 11 refills | Status: DC

## 2020-08-18 NOTE — Addendum Note (Signed)
Addended by: Amaiyah Nordhoff E on: 08/18/2020 08:53 AM   Modules accepted: Orders

## 2020-08-26 ENCOUNTER — Other Ambulatory Visit: Payer: Self-pay | Admitting: Cardiology

## 2020-09-24 DIAGNOSIS — M810 Age-related osteoporosis without current pathological fracture: Secondary | ICD-10-CM | POA: Diagnosis not present

## 2020-09-24 DIAGNOSIS — Z23 Encounter for immunization: Secondary | ICD-10-CM | POA: Diagnosis not present

## 2020-10-16 ENCOUNTER — Other Ambulatory Visit: Payer: Self-pay | Admitting: Interventional Cardiology

## 2020-12-04 DIAGNOSIS — Z0000 Encounter for general adult medical examination without abnormal findings: Secondary | ICD-10-CM | POA: Diagnosis not present

## 2020-12-04 DIAGNOSIS — I739 Peripheral vascular disease, unspecified: Secondary | ICD-10-CM | POA: Diagnosis not present

## 2020-12-04 DIAGNOSIS — D692 Other nonthrombocytopenic purpura: Secondary | ICD-10-CM | POA: Diagnosis not present

## 2020-12-04 DIAGNOSIS — J449 Chronic obstructive pulmonary disease, unspecified: Secondary | ICD-10-CM | POA: Diagnosis not present

## 2021-04-01 DIAGNOSIS — M25473 Effusion, unspecified ankle: Secondary | ICD-10-CM | POA: Diagnosis not present

## 2021-04-18 ENCOUNTER — Other Ambulatory Visit: Payer: Self-pay | Admitting: Interventional Cardiology

## 2021-04-19 ENCOUNTER — Other Ambulatory Visit: Payer: Self-pay

## 2021-04-19 ENCOUNTER — Observation Stay (HOSPITAL_COMMUNITY)
Admission: EM | Admit: 2021-04-19 | Discharge: 2021-04-21 | Disposition: A | Discharge status: Home / Self Care | Payer: Medicare Other | Attending: Student | Admitting: Student

## 2021-04-19 ENCOUNTER — Emergency Department (HOSPITAL_COMMUNITY): Payer: Medicare Other

## 2021-04-19 DIAGNOSIS — D500 Iron deficiency anemia secondary to blood loss (chronic): Secondary | ICD-10-CM | POA: Diagnosis not present

## 2021-04-19 DIAGNOSIS — Z955 Presence of coronary angioplasty implant and graft: Secondary | ICD-10-CM | POA: Insufficient documentation

## 2021-04-19 DIAGNOSIS — J449 Chronic obstructive pulmonary disease, unspecified: Secondary | ICD-10-CM | POA: Diagnosis not present

## 2021-04-19 DIAGNOSIS — Z7902 Long term (current) use of antithrombotics/antiplatelets: Secondary | ICD-10-CM | POA: Diagnosis not present

## 2021-04-19 DIAGNOSIS — Z20822 Contact with and (suspected) exposure to covid-19: Secondary | ICD-10-CM | POA: Diagnosis not present

## 2021-04-19 DIAGNOSIS — K921 Melena: Secondary | ICD-10-CM | POA: Diagnosis present

## 2021-04-19 DIAGNOSIS — I5032 Chronic diastolic (congestive) heart failure: Secondary | ICD-10-CM | POA: Diagnosis not present

## 2021-04-19 DIAGNOSIS — I110 Hypertensive heart disease with heart failure: Secondary | ICD-10-CM | POA: Insufficient documentation

## 2021-04-19 DIAGNOSIS — I2510 Atherosclerotic heart disease of native coronary artery without angina pectoris: Secondary | ICD-10-CM | POA: Insufficient documentation

## 2021-04-19 DIAGNOSIS — J441 Chronic obstructive pulmonary disease with (acute) exacerbation: Secondary | ICD-10-CM | POA: Insufficient documentation

## 2021-04-19 DIAGNOSIS — K2950 Unspecified chronic gastritis without bleeding: Secondary | ICD-10-CM | POA: Diagnosis not present

## 2021-04-19 DIAGNOSIS — Z79899 Other long term (current) drug therapy: Secondary | ICD-10-CM | POA: Insufficient documentation

## 2021-04-19 DIAGNOSIS — K922 Gastrointestinal hemorrhage, unspecified: Secondary | ICD-10-CM | POA: Diagnosis not present

## 2021-04-19 DIAGNOSIS — R0902 Hypoxemia: Secondary | ICD-10-CM | POA: Diagnosis not present

## 2021-04-19 DIAGNOSIS — K2980 Duodenitis without bleeding: Secondary | ICD-10-CM | POA: Diagnosis present

## 2021-04-19 DIAGNOSIS — K259 Gastric ulcer, unspecified as acute or chronic, without hemorrhage or perforation: Secondary | ICD-10-CM | POA: Diagnosis present

## 2021-04-19 DIAGNOSIS — I10 Essential (primary) hypertension: Secondary | ICD-10-CM | POA: Diagnosis present

## 2021-04-19 DIAGNOSIS — E785 Hyperlipidemia, unspecified: Secondary | ICD-10-CM | POA: Diagnosis present

## 2021-04-19 DIAGNOSIS — R110 Nausea: Secondary | ICD-10-CM | POA: Diagnosis present

## 2021-04-19 DIAGNOSIS — I25118 Atherosclerotic heart disease of native coronary artery with other forms of angina pectoris: Secondary | ICD-10-CM | POA: Diagnosis present

## 2021-04-19 LAB — CBC WITH DIFFERENTIAL/PLATELET
Abs Immature Granulocytes: 0.05 10*3/uL (ref 0.00–0.07)
Basophils Absolute: 0 10*3/uL (ref 0.0–0.1)
Basophils Relative: 0 %
Eosinophils Absolute: 0.1 10*3/uL (ref 0.0–0.5)
Eosinophils Relative: 1 %
HCT: 31.4 % — ABNORMAL LOW (ref 36.0–46.0)
Hemoglobin: 10.2 g/dL — ABNORMAL LOW (ref 12.0–15.0)
Immature Granulocytes: 0 %
Lymphocytes Relative: 21 %
Lymphs Abs: 2.5 10*3/uL (ref 0.7–4.0)
MCH: 33.2 pg (ref 26.0–34.0)
MCHC: 32.5 g/dL (ref 30.0–36.0)
MCV: 102.3 fL — ABNORMAL HIGH (ref 80.0–100.0)
Monocytes Absolute: 0.7 10*3/uL (ref 0.1–1.0)
Monocytes Relative: 6 %
Neutro Abs: 8.5 10*3/uL — ABNORMAL HIGH (ref 1.7–7.7)
Neutrophils Relative %: 72 %
Platelets: 209 10*3/uL (ref 150–400)
RBC: 3.07 MIL/uL — ABNORMAL LOW (ref 3.87–5.11)
RDW: 13.1 % (ref 11.5–15.5)
WBC: 11.9 10*3/uL — ABNORMAL HIGH (ref 4.0–10.5)
nRBC: 0 % (ref 0.0–0.2)

## 2021-04-19 LAB — COMPREHENSIVE METABOLIC PANEL
ALT: 14 U/L (ref 0–44)
AST: 16 U/L (ref 15–41)
Albumin: 4.2 g/dL (ref 3.5–5.0)
Alkaline Phosphatase: 36 U/L — ABNORMAL LOW (ref 38–126)
Anion gap: 7 (ref 5–15)
BUN: 56 mg/dL — ABNORMAL HIGH (ref 8–23)
CO2: 22 mmol/L (ref 22–32)
Calcium: 9.4 mg/dL (ref 8.9–10.3)
Chloride: 109 mmol/L (ref 98–111)
Creatinine, Ser: 0.96 mg/dL (ref 0.44–1.00)
GFR, Estimated: 57 mL/min — ABNORMAL LOW (ref >60)
Glucose, Bld: 105 mg/dL — ABNORMAL HIGH (ref 70–99)
Potassium: 4.1 mmol/L (ref 3.5–5.1)
Sodium: 138 mmol/L (ref 135–145)
Total Bilirubin: 0.5 mg/dL (ref 0.3–1.2)
Total Protein: 6.5 g/dL (ref 6.5–8.1)

## 2021-04-19 LAB — LIPASE, BLOOD: Lipase: 33 U/L (ref 11–51)

## 2021-04-19 LAB — PROTIME-INR
INR: 1 (ref 0.8–1.2)
Prothrombin Time: 13.2 seconds (ref 11.4–15.2)

## 2021-04-19 LAB — TYPE AND SCREEN
ABO/RH(D): A POS
Antibody Screen: NEGATIVE

## 2021-04-19 LAB — RESP PANEL BY RT-PCR (FLU A&B, COVID) ARPGX2
Influenza A by PCR: NEGATIVE
Influenza B by PCR: NEGATIVE
SARS Coronavirus 2 by RT PCR: NEGATIVE

## 2021-04-19 LAB — POC OCCULT BLOOD, ED: Fecal Occult Bld: POSITIVE — AB

## 2021-04-19 MED ORDER — LACTATED RINGERS IV SOLN: INTRAVENOUS | Status: DC

## 2021-04-19 MED ORDER — ACETAMINOPHEN 325 MG PO TABS: 650.0000 mg | ORAL_TABLET | Freq: Four times a day (QID) | ORAL | Status: DC | PRN

## 2021-04-19 MED ORDER — ONDANSETRON HCL 4 MG PO TABS
4.0000 mg | ORAL_TABLET | Freq: Four times a day (QID) | ORAL | Status: DC | PRN
  Administered 2021-04-21: 4 mg via ORAL
  Filled 2021-04-19: qty 1

## 2021-04-19 MED ORDER — ONDANSETRON HCL 4 MG/2ML IJ SOLN
4.0000 mg | Freq: Four times a day (QID) | INTRAMUSCULAR | Status: DC | PRN
  Administered 2021-04-20: 4 mg via INTRAVENOUS
  Filled 2021-04-19: qty 2

## 2021-04-19 MED ORDER — ONDANSETRON HCL 4 MG/2ML IJ SOLN
4.0000 mg | Freq: Once | INTRAMUSCULAR | Status: AC
  Administered 2021-04-19: 4 mg via INTRAVENOUS
  Filled 2021-04-19: qty 2

## 2021-04-19 MED ORDER — ALBUTEROL SULFATE (2.5 MG/3ML) 0.083% IN NEBU: 2.5000 mg | INHALATION_SOLUTION | RESPIRATORY_TRACT | Status: DC | PRN

## 2021-04-19 MED ORDER — ACETAMINOPHEN 650 MG RE SUPP: 650.0000 mg | Freq: Four times a day (QID) | RECTAL | Status: DC | PRN

## 2021-04-19 MED ORDER — SODIUM CHLORIDE 0.9 % IV SOLN
80.0000 mg | Freq: Once | INTRAVENOUS | Status: AC
  Administered 2021-04-19: 80 mg via INTRAVENOUS
  Filled 2021-04-19: qty 80

## 2021-04-19 MED ORDER — BUDESON-GLYCOPYRROL-FORMOTEROL 160-9-4.8 MCG/ACT IN AERO: 2.0000 | INHALATION_SPRAY | Freq: Two times a day (BID) | RESPIRATORY_TRACT | Status: DC

## 2021-04-19 MED ORDER — UMECLIDINIUM BROMIDE 62.5 MCG/INH IN AEPB
1.0000 | INHALATION_SPRAY | Freq: Every day | RESPIRATORY_TRACT | Status: DC
  Administered 2021-04-20 – 2021-04-21 (×2): 1 via RESPIRATORY_TRACT
  Filled 2021-04-19: qty 7

## 2021-04-19 MED ORDER — FLUTICASONE FUROATE-VILANTEROL 100-25 MCG/INH IN AEPB
1.0000 | INHALATION_SPRAY | Freq: Every day | RESPIRATORY_TRACT | Status: DC
  Administered 2021-04-20 – 2021-04-21 (×2): 1 via RESPIRATORY_TRACT
  Filled 2021-04-19: qty 28

## 2021-04-19 MED ORDER — SODIUM CHLORIDE 0.9 % IV BOLUS
500.0000 mL | Freq: Once | INTRAVENOUS | Status: AC
  Administered 2021-04-19: 500 mL via INTRAVENOUS

## 2021-04-19 MED ORDER — PANTOPRAZOLE SODIUM 40 MG IV SOLR
40.0000 mg | Freq: Two times a day (BID) | INTRAVENOUS | Status: DC
  Administered 2021-04-20 – 2021-04-21 (×3): 40 mg via INTRAVENOUS
  Filled 2021-04-19 (×3): qty 40

## 2021-04-19 NOTE — ED Provider Notes (Signed)
[facility]-EMERGENCY DEPT Provider Note   CSN: [id number] Arrival date & time: 04/19/21  1745     History No chief complaint on file.   Sydney Gross is a 87 y.o. female.  87 year old female with prior medical history detailed below presents for evaluation.  Patient complains of dark tarry stool.  This began today.  She also complains of nausea without vomiting.  She denies fever.  She complains of some vague left lower quadrant abdominal discomfort.  Patient is taking Plavix.  She denies prior history of GI bleed.  She denies prior history of transfusion.    The history is provided by the patient and medical records.  Illness Location:  Nausea, abdominal pain, dark tarry stool Severity:  Moderate Onset quality:  Gradual Duration:  1 day Timing:  Constant Progression:  Waxing and waning Chronicity:  New Associated symptoms: no fever and no shortness of breath        Past Medical History:  Diagnosis Date  . Allergic rhinitis   . Arthritis    "maybe a little in my knees and back" (07/17/2018)  . Chronic lower back pain   . Colitis 12/2013   "never had this before"  . COPD (chronic obstructive pulmonary disease) (HCC)    "questionable/Dr. Katrinka Blazing" (07/17/2018)  . Coronary artery disease   . Exertional shortness of breath    "off and on" (07/17/2018)  . Family history of anesthesia complication    "mother; think they gave her too much; c/o pain after OR; gave her more RX; they had to bring her back real quick"  . H/O calcium pyrophosphate deposition disease (CPPD)   . Heart murmur   . Hiatal hernia   . Hyperlipidemia   . Hypertension   . Numbness in feet   . On home oxygen therapy    "I've used it 2 times in 2 yrs" (07/17/2018)  . PAD (peripheral artery disease) (HCC)   . Pneumonia    "1st grade & 4th grade" (07/17/2018)  . Proteinuria   . Shingles   . Stenosis of tear duct     Patient Active Problem List   Diagnosis Date Noted  .  Hyperlipidemia 09/14/2019  . Statin myopathy 09/14/2019  . Chronic diastolic CHF (congestive heart failure) (HCC) 08/02/2018  . Coronary artery disease of native artery of native heart with stable angina pectoris (HCC)   . COPD exacerbation (HCC) 11/18/2016  . PAD (peripheral artery disease) (HCC) 11/18/2016  . Bradycardia   . Dilated aortic root (HCC) 01/27/2015  . Dyspnea on exertion 12/16/2014  . Aortic stenosis, supravalvular 12/16/2014  . Atherosclerosis of native arteries of extremity with intermittent claudication (HCC) 08/30/2014  . Malnutrition of moderate degree (HCC) 12/12/2013  . Hypertension 12/09/2013  . Dyslipidemia 12/09/2013  . Enteritis due to Clostridium difficile 12/05/2013    Past Surgical History:  Procedure Laterality Date  . ABDOMINAL HYSTERECTOMY  1960's  . APPENDECTOMY    . BACK SURGERY    . BLEPHAROPLASTY Bilateral 05/2017  . CARDIAC CATHETERIZATION    . CATARACT EXTRACTION, BILATERAL Bilateral   . CESAREAN SECTION  1957; 1960  . CORONARY STENT INTERVENTION N/A 07/17/2018   Procedure: CORONARY STENT INTERVENTION;  Surgeon: Lyn Records, MD;  Location: [facility] INVASIVE CV LAB;  Service: Cardiovascular;  Laterality: N/A;  . DACROCYSTORHINOSTOMY W/ JONES TUBE Bilateral 05/2017   "put tear duct in my eyes"  . EYE SURGERY    . LAPAROSCOPIC CHOLECYSTECTOMY  ~ 2009  . LUMBAR DISC SURGERY  ?  2004  . RIGHT/LEFT HEART CATH AND CORONARY ANGIOGRAPHY N/A 07/17/2018   Procedure: RIGHT/LEFT HEART CATH AND CORONARY ANGIOGRAPHY;  Surgeon: Lyn RecordsSmith, Henry W, MD;  Location: [facility] INVASIVE CV LAB;  Service: Cardiovascular;  Laterality: N/A;  . TONSILLECTOMY AND ADENOIDECTOMY  ?1940's     OB History   No obstetric history on file.     Family History  Problem Relation Age of Onset  . Emphysema Father 2069  . CVA Mother   . Cancer Brother        ? type    Social History   Tobacco Use  . Smoking status: Never Smoker  . Smokeless tobacco: Never Used  Vaping Use  . Vaping  Use: Never used  Substance Use Topics  . Alcohol use: Yes    Alcohol/week: 4.0 standard drinks    Types: 4 Glasses of wine per week    Comment: glass of wine every other night  . Drug use: Never    Home Medications Prior to Admission medications   Medication Sig Start Date End Date Taking? Authorizing Provider  albuterol (PROVENTIL) (2.5 MG/3ML) 0.083% nebulizer solution Take 3 mLs (2.5 mg total) by nebulization every 4 (four) hours as needed for wheezing or shortness of breath. 11/21/16  Yes Tyrone NineGrunz, Ryan B, MD  Budeson-Glycopyrrol-Formoterol (BREZTRI AEROSPHERE) 160-9-4.8 MCG/ACT AERO Inhale 2 puffs into the lungs in the morning and at bedtime. 11/15/19  Yes [provider]  cholecalciferol (VITAMIN D) 1000 units tablet Take 1,000 Units by mouth at bedtime.   Yes [provider]  clopidogrel (PLAVIX) 75 MG tablet Take 1 tablet by mouth once daily 08/26/20  Yes Leone BrandIngold, Laura R, NP  Denosumab (PROLIA Lago) Inject 1 Dose as directed every 6 (six) months. Use as directed.   Yes [provider]  diphenhydramine-acetaminophen (TYLENOL PM) 25-500 MG TABS tablet Take 1 tablet by mouth at bedtime.   Yes [provider]  Evolocumab (REPATHA SURECLICK) 140 MG/ML SOAJ INJECT 1 PEN INTO THE SKIN EVERY 14 DAYS 08/18/20  Yes Lyn RecordsSmith, Henry W, MD  lisinopril-hydrochlorothiazide (PRINZIDE,ZESTORETIC) 20-25 MG tablet Take 1 tablet by mouth at bedtime.    Yes [provider]  Magnesium Cl-Calcium Carbonate (SLOW-MAG PO) Take 1 tablet by mouth daily.   Yes [provider]  metoprolol succinate (TOPROL-XL) 25 MG 24 hr tablet Take 1/2 (one-half) tablet by mouth once daily Patient taking differently: Take 12.5 mg by mouth daily. 10/16/20  Yes Lyn RecordsSmith, Henry W, MD  naproxen sodium (ALEVE) 220 MG tablet Take 440 mg by mouth daily.   Yes [provider]  nitroGLYCERIN (NITROSTAT) 0.4 MG SL tablet Place 0.4 mg under the tongue every 5 (five) minutes as needed for  chest pain.   Yes [provider]  acetaminophen (TYLENOL) 500 MG tablet Take 2 tablets (1,000 mg total) by mouth 2 (two) times daily as needed (pain). Patient not taking: No sig reported 01/30/19   Derwood KaplanNanavati, Ankit, MD  ezetimibe (ZETIA) 10 MG tablet Take 1 tablet by mouth once daily Patient not taking: Reported on 04/19/2021 04/24/20   Lyn RecordsSmith, Henry W, MD  nitroGLYCERIN (NITROSTAT) 0.4 MG SL tablet Place 1 tablet (0.4 mg total) under the tongue every 5 (five) minutes as needed for chest pain. 07/18/18 03/19/20  Kroeger, Ovidio KinKrista M., PA-C  spironolactone (ALDACTONE) 25 MG tablet Take 1/2 (one-half) tablet by mouth once daily Patient not taking: Reported on 04/19/2021 10/16/20   Lyn RecordsSmith, Henry W, MD    Allergies    Brilinta [ticagrelor], Codeine, Crestor [rosuvastatin],  Darvon [propoxyphene], Lipitor [atorvastatin], Zocor [simvastatin], and Nexlizet [bempedoic acid-ezetimibe]  Review of Systems   Review of Systems  Constitutional: Negative for fever.  Respiratory: Negative for shortness of breath.   All other systems reviewed and are negative.   Physical Exam Updated Vital Signs BP (!) 91/56 (BP Location: Left Arm)   Pulse 76   Temp 98 F (36.7 C) (Oral)   Resp 18   SpO2 98%   Physical Exam Vitals and nursing note reviewed.  Constitutional:      General: She is not in acute distress.    Appearance: Normal appearance. She is well-developed.  HENT:     Head: Normocephalic and atraumatic.  Eyes:     Conjunctiva/sclera: Conjunctivae normal.     Pupils: Pupils are equal, round, and reactive to light.  Cardiovascular:     Rate and Rhythm: Normal rate and regular rhythm.     Heart sounds: Normal heart sounds.  Pulmonary:     Effort: Pulmonary effort is normal. No respiratory distress.     Breath sounds: Normal breath sounds.  Abdominal:     General: There is no distension.     Palpations: Abdomen is soft.     Tenderness: There is no abdominal tenderness.  Genitourinary:     Comments: Dark tarry stool present on rectal exam  Musculoskeletal:        General: No deformity. Normal range of motion.     Cervical back: Normal range of motion and neck supple.  Skin:    General: Skin is warm and dry.  Neurological:     General: No focal deficit present.     Mental Status: She is alert and oriented to person, place, and time. Mental status is at baseline.     ED Results / Procedures / Treatments   Labs (all labs ordered are listed, but only abnormal results are displayed) Labs Reviewed  CBC WITH DIFFERENTIAL/PLATELET - Abnormal; Notable for the following components:      Result Value   WBC 11.9 (*)    RBC 3.07 (*)    Hemoglobin 10.2 (*)    HCT 31.4 (*)    MCV 102.3 (*)    Neutro Abs 8.5 (*)    All other components within normal limits  COMPREHENSIVE METABOLIC PANEL - Abnormal; Notable for the following components:   Glucose, Bld 105 (*)    BUN 56 (*)    Alkaline Phosphatase 36 (*)    GFR, Estimated 57 (*)    All other components within normal limits  POC OCCULT BLOOD, ED - Abnormal; Notable for the following components:   Fecal Occult Bld POSITIVE (*)    All other components within normal limits  RESP PANEL BY RT-PCR (FLU A&B, COVID) ARPGX2  PROTIME-INR  LIPASE, BLOOD  TYPE AND SCREEN    EKG None  Radiology CT ABDOMEN PELVIS WO CONTRAST  Result Date: 04/19/2021 CLINICAL DATA:  Dry heaving and black stool. Denies GI bleed history EXAM: CT ABDOMEN AND PELVIS WITHOUT CONTRAST TECHNIQUE: Multidetector CT imaging of the abdomen and pelvis was performed following the standard protocol without IV contrast. COMPARISON:  CT abdomen pelvis 08/24/2019 FINDINGS: Lower chest: No acute abnormality. Evaluation of the abdominal viscera limited by the lack of IV contrast. Hepatobiliary: No focal liver abnormality is seen. Status post cholecystectomy. Pancreas: Unremarkable. No surrounding inflammatory changes. Spleen: Normal in size without focal abnormality.  Adrenals/Urinary Tract: Adrenal glands are unremarkable. Left kidney is atrophic. There is a simple cyst in the inferior pole  the right kidney. 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 No hydronephrosis. No renal calculi. Urinary bladder is unremarkable. Stomach/Bowel: Stomach is within normal limits. No evidence of bowel wall thickening, distention, or inflammatory changes. Multiple colonic diverticula without evidence of diverticulitis. 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 Aortic atherosclerosis. Vascular patency cannot be assessed in the absence of IV contrast. Reproductive: Status post hysterectomy. No adnexal masses. Other: No abdominal wall hernia or abnormality. No abdominopelvic ascites. Musculoskeletal: Multilevel degenerative disc disease and scoliosis of the lumbar spine. No acute abnormality identified. IMPRESSION: 1. No acute intra-abdominal pathology on a noncontrast exam. 2. Colonic diverticulosis without evidence of diverticulitis. 3. 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 4. Aortic atherosclerosis. Aortic Atherosclerosis (ICD10-I70.0). Electronically Signed   By: Emmaline Kluver M.D.   On: 04/19/2021 21:07    Procedures Procedures   CRITICAL CARE Performed by: Wynetta Fines   Total critical care time: 30 minutes  Critical care time was exclusive of separately billable procedures and treating other patients.  Critical care was necessary to treat or prevent imminent or life-threatening deterioration.  Critical care was time spent personally by me on the following activities: development of treatment plan with patient and/or surrogate as well as nursing, discussions with consultants, evaluation of patient's response to treatment, examination of patient, obtaining history from patient or surrogate, ordering and performing treatments and  interventions, ordering and review of laboratory studies, ordering and review of radiographic studies, pulse oximetry and re-evaluation of patient's condition.   Medications Ordered in ED Medications  ondansetron (ZOFRAN) injection 4 mg (4 mg Intravenous Given 04/19/21 2006)  sodium chloride 0.9 % bolus 500 mL (500 mLs Intravenous New Bag/Given 04/19/21 2035)  ondansetron (ZOFRAN) injection 4 mg (4 mg Intravenous Given 04/19/21 2057)    ED Course  I have reviewed the triage vital signs and the nursing notes.  Pertinent labs & imaging results that were available during my care of the patient were reviewed by me and considered in my medical decision making (see chart for details).    MDM Rules/Calculators/A&P                          MDM  MSE complete  Sydney Gross was evaluated in Emergency Department on 04/19/2021 for the symptoms described in the history of present illness. She was evaluated in the context of the global COVID-19 pandemic, which necessitated consideration that the patient might be at risk for infection with the SARS-CoV-2 virus that causes COVID-19. Institutional protocols and algorithms that pertain to the evaluation of patients at risk for COVID-19 are in a state of rapid change based on information released by regulatory bodies including the CDC and federal and state organizations. These policies and algorithms were followed during the patient's care in the ED.   Patient is presenting with complaint of nausea with associated dark tarry stool.  Patient is on Plavix.  Patient with guaiac positive dark tarry stool on exam.  Patient nausea is improved with administration of Zofran.  Patient will require admission for further work-up and evaluation.  Patient reports that she has seen Dr. Charna Elizabeth of GI previously.  Her last encounter with Dr. Loreta Ave was approximately 5 to 6 years ago.  Case discussed with Dr. Elnoria Howard (GI) - patient to be NPO post midnight with plan  to perform upper endoscopy tomorrow.  Hospitalist service is aware of case and will evaluate for admission.   Final Clinical Impression(s) / ED Diagnoses Final diagnoses:  Gastrointestinal hemorrhage, unspecified gastrointestinal hemorrhage type    Rx / DC Orders ED Discharge Orders    None       Wynetta Fines, MD 04/19/21 2137

## 2021-04-19 NOTE — H&P (Signed)
History and Physical    Sydney Gross ZOX:[id number] DOB: 1930/03/11 DOA: 04/19/2021  PCP: Lupita RaiderShaw, Kimberlee, MD  Patient coming from: Home via EMS  I have personally briefly reviewed patient's old medical records [facility]  Chief Complaint: Dark black stool  HPI: Sydney Gross is a 87 y.o. female with medical history significant for CAD s/p PCI stent to LAD 07/2018, COPD, PAD, HTN, HLD who presents to the ED for evaluation of nausea with dark black stool.  Patient states she developed new onset of nausea night of 5/14 after eating dinner.  She was having frequent dry heaves without any emesis.  This morning (5/15) she noticed dark black tarry stools.  She has had associated diaphoresis and felt lightheaded with ambulation.  She has not seen any bright red blood per rectum.  She does take Plavix daily with last dose taken on 5/14.  She also takes Aleve 1-2 times per day over the last year.  She denies any prior GI bleed.  She did have some left lower quadrant abdominal pain earlier.  She has had some exertional dyspnea which she feels is related to her COPD.   ED Course:  Initial vitals showed BP 91/56, pulse 76, RR 18, temp 98.0 F, SPO2 98% on room air.  Labs show WBC 11.9, hemoglobin 10.2, hematocrit 31.4, MCV 102.3, platelets 209,000, sodium 138, potassium 4.1, bicarb 22, BUN 56, creatinine 0.96, serum glucose 105, lipase 33, INR 1.0.  FOBT is positive.  SARS-CoV-2 PCR negative.  Influenza A/B PCR negative.  CT abdomen/pelvis without contrast was negative for acute intra-abdominal pathology.  Colonic diverticulosis without evidence of diverticulitis noted.  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  Patient was given IV Protonix 80 mg, 500 cc normal saline, and IV Zofran.  EDP discussed with on-call GI Dr. Elnoria HowardHung who plans for EGD tomorrow.  The hospitalist service was consulted to admit for further evaluation and management.  Review of  Systems: All systems reviewed and are negative except as documented in history of present illness above.   Past Medical History:  Diagnosis Date  . Allergic rhinitis   . Arthritis    "maybe a little in my knees and back" (07/17/2018)  . Chronic lower back pain   . Colitis 12/2013   "never had this before"  . COPD (chronic obstructive pulmonary disease) (HCC)    "questionable/Dr. Katrinka BlazingSmith" (07/17/2018)  . Coronary artery disease   . Exertional shortness of breath    "off and on" (07/17/2018)  . Family history of anesthesia complication    "mother; think they gave her too much; c/o pain after OR; gave her more RX; they had to bring her back real quick"  . H/O calcium pyrophosphate deposition disease (CPPD)   . Heart murmur   . Hiatal hernia   . Hyperlipidemia   . Hypertension   . Numbness in feet   . On home oxygen therapy    "I've used it 2 times in 2 yrs" (07/17/2018)  . PAD (peripheral artery disease) (HCC)   . Pneumonia    "1st grade & 4th grade" (07/17/2018)  . Proteinuria   . Shingles   . Stenosis of tear duct     Past Surgical History:  Procedure Laterality Date  . ABDOMINAL HYSTERECTOMY  1960's  . APPENDECTOMY    . BACK SURGERY    . BLEPHAROPLASTY Bilateral 05/2017  . CARDIAC CATHETERIZATION    . CATARACT EXTRACTION, BILATERAL Bilateral   .  CESAREAN SECTION  1957; 1960  . CORONARY STENT INTERVENTION N/A 07/17/2018   Procedure: CORONARY STENT INTERVENTION;  Surgeon: Lyn Records, MD;  Location: [facility] INVASIVE CV LAB;  Service: Cardiovascular;  Laterality: N/A;  . DACROCYSTORHINOSTOMY W/ JONES TUBE Bilateral 05/2017   "put tear duct in my eyes"  . EYE SURGERY    . LAPAROSCOPIC CHOLECYSTECTOMY  ~ 2009  . LUMBAR DISC SURGERY  ?2004  . RIGHT/LEFT HEART CATH AND CORONARY ANGIOGRAPHY N/A 07/17/2018   Procedure: RIGHT/LEFT HEART CATH AND CORONARY ANGIOGRAPHY;  Surgeon: Lyn Records, MD;  Location: [facility] INVASIVE CV LAB;  Service: Cardiovascular;  Laterality: N/A;  . TONSILLECTOMY  AND ADENOIDECTOMY  ?1940's    Social History:  reports that she has never smoked. She has never used smokeless tobacco. She reports current alcohol use of about 4.0 standard drinks of alcohol per week. She reports that she does not use drugs.  Allergies  Allergen Reactions  . Brilinta [Ticagrelor] Shortness Of Breath  . Codeine Nausea And Vomiting  . Crestor [Rosuvastatin] Other (See Comments)    MYALGIAS  . Darvon [Propoxyphene] Nausea Only  . Lipitor [Atorvastatin] Other (See Comments)    MYALGIAS  . Zocor [Simvastatin] Other (See Comments)    MYALGIAS  . Nexlizet [Bempedoic Acid-Ezetimibe]     Muscle aches (tolerates Zetia just fine, allergy is with bempedoic acid component)    Family History  Problem Relation Age of Onset  . Emphysema Father 7  . CVA Mother   . Cancer Brother        ? type     Prior to Admission medications   Medication Sig Start Date End Date Taking? Authorizing Provider  albuterol (PROVENTIL) (2.5 MG/3ML) 0.083% nebulizer solution Take 3 mLs (2.5 mg total) by nebulization every 4 (four) hours as needed for wheezing or shortness of breath. 11/21/16  Yes Tyrone Nine, MD  Budeson-Glycopyrrol-Formoterol (BREZTRI AEROSPHERE) 160-9-4.8 MCG/ACT AERO Inhale 2 puffs into the lungs in the morning and at bedtime. 11/15/19  Yes [provider]  cholecalciferol (VITAMIN D) 1000 units tablet Take 1,000 Units by mouth at bedtime.   Yes [provider]  clopidogrel (PLAVIX) 75 MG tablet Take 1 tablet by mouth once daily 08/26/20  Yes Leone Brand, NP  Denosumab (PROLIA Parker) Inject 1 Dose as directed every 6 (six) months. Use as directed.   Yes [provider]  diphenhydramine-acetaminophen (TYLENOL PM) 25-500 MG TABS tablet Take 1 tablet by mouth at bedtime.   Yes [provider]  Evolocumab (REPATHA SURECLICK) 140 MG/ML SOAJ INJECT 1 PEN INTO THE SKIN EVERY 14 DAYS 08/18/20  Yes Lyn Records, MD  lisinopril-hydrochlorothiazide  (PRINZIDE,ZESTORETIC) 20-25 MG tablet Take 1 tablet by mouth at bedtime.    Yes [provider]  Magnesium Cl-Calcium Carbonate (SLOW-MAG PO) Take 1 tablet by mouth daily.   Yes [provider]  metoprolol succinate (TOPROL-XL) 25 MG 24 hr tablet Take 1/2 (one-half) tablet by mouth once daily Patient taking differently: Take 12.5 mg by mouth daily. 10/16/20  Yes Lyn Records, MD  naproxen sodium (ALEVE) 220 MG tablet Take 440 mg by mouth daily.   Yes [provider]  nitroGLYCERIN (NITROSTAT) 0.4 MG SL tablet Place 0.4 mg under the tongue every 5 (five) minutes as needed for chest pain.   Yes [provider]  acetaminophen (TYLENOL) 500 MG tablet Take 2 tablets (1,000 mg total) by mouth 2 (two) times daily as needed (pain). Patient not taking: No sig reported  01/30/19   Derwood Kaplan, MD  ezetimibe (ZETIA) 10 MG tablet Take 1 tablet by mouth once daily Patient not taking: Reported on 04/19/2021 04/24/20   Lyn Records, MD  nitroGLYCERIN (NITROSTAT) 0.4 MG SL tablet Place 1 tablet (0.4 mg total) under the tongue every 5 (five) minutes as needed for chest pain. 07/18/18 03/19/20  Kroeger, Ovidio Kin., PA-C  spironolactone (ALDACTONE) 25 MG tablet Take 1/2 (one-half) tablet by mouth once daily Patient not taking: Reported on 04/19/2021 10/16/20   Lyn Records, MD    Physical Exam: Vitals:   04/19/21 1818 04/19/21 2100  BP: (!) 91/56 (!) 128/56  Pulse: 76 89  Resp: 18 (!) 23  Temp: 98 F (36.7 C)   TempSrc: Oral   SpO2: 98% 100%   Constitutional: Resting supine in bed, NAD, calm, comfortable Eyes: PERRL, lids and conjunctivae normal ENMT: Mucous membranes are moist. Posterior pharynx clear of any exudate or lesions.Normal dentition.  Neck: normal, supple, no masses. Respiratory: clear to auscultation bilaterally, no wheezing, no crackles. Normal respiratory effort. No accessory muscle use.  Cardiovascular: Regular rate and rhythm, no murmurs / rubs /  gallops. No extremity edema. 2+ pedal pulses. Abdomen: no tenderness, no masses palpated. Bowel sounds positive.  Musculoskeletal: no clubbing / cyanosis. No joint deformity upper and lower extremities. Good ROM, no contractures. Normal muscle tone.  Skin: no rashes, lesions, ulcers. No induration Neurologic: CN 2-12 grossly intact. Sensation intact. Strength 5/5 in all 4.  Psychiatric: Normal judgment and insight. Alert and oriented x 3. Normal mood.   Labs on Admission: I have personally reviewed following labs and imaging studies  CBC: Recent Labs  Lab 04/19/21 2003  WBC 11.9*  NEUTROABS 8.5*  HGB 10.2*  HCT 31.4*  MCV 102.3*  PLT 209   Basic Metabolic Panel: Recent Labs  Lab 04/19/21 2003  NA 138  K 4.1  CL 109  CO2 22  GLUCOSE 105*  BUN 56*  CREATININE 0.96  CALCIUM 9.4   GFR: CrCl cannot be calculated (Unknown ideal weight.). Liver Function Tests: Recent Labs  Lab 04/19/21 2003  AST 16  ALT 14  ALKPHOS 36*  BILITOT 0.5  PROT 6.5  ALBUMIN 4.2   Recent Labs  Lab 04/19/21 2003  LIPASE 33   No results for input(s): AMMONIA in the last 168 hours. Coagulation Profile: Recent Labs  Lab 04/19/21 2003  INR 1.0   Cardiac Enzymes: No results for input(s): CKTOTAL, CKMB, CKMBINDEX, TROPONINI in the last 168 hours. BNP (last 3 results) No results for input(s): PROBNP in the last 8760 hours. HbA1C: No results for input(s): HGBA1C in the last 72 hours. CBG: No results for input(s): GLUCAP in the last 168 hours. Lipid Profile: No results for input(s): CHOL, HDL, LDLCALC, TRIG, CHOLHDL, LDLDIRECT in the last 72 hours. Thyroid Function Tests: No results for input(s): TSH, T4TOTAL, FREET4, T3FREE, THYROIDAB in the last 72 hours. Anemia Panel: No results for input(s): VITAMINB12, FOLATE, FERRITIN, TIBC, IRON, RETICCTPCT in the last 72 hours. Urine analysis:    Component Value Date/Time   COLORURINE ORANGE (A) 10/29/2015 1955   APPEARANCEUR CLOUDY (A)  10/29/2015 1955   LABSPEC 1.012 10/29/2015 1955   PHURINE 6.0 10/29/2015 1955   GLUCOSEU NEGATIVE 10/29/2015 1955   HGBUR NEGATIVE 10/29/2015 1955   BILIRUBINUR NEGATIVE 10/29/2015 1955   KETONESUR NEGATIVE 10/29/2015 1955   PROTEINUR 30 (A) 10/29/2015 1955   UROBILINOGEN 0.2 12/09/2013 1902   NITRITE NEGATIVE 10/29/2015 1955   LEUKOCYTESUR TRACE (A) 10/29/2015  1955    Radiological Exams on Admission: CT ABDOMEN PELVIS WO CONTRAST  Result Date: 04/19/2021 CLINICAL DATA:  Dry heaving and black stool. Denies GI bleed history EXAM: CT ABDOMEN AND PELVIS WITHOUT CONTRAST TECHNIQUE: Multidetector CT imaging of the abdomen and pelvis was performed following the standard protocol without IV contrast. COMPARISON:  CT abdomen pelvis 08/24/2019 FINDINGS: Lower chest: No acute abnormality. Evaluation of the abdominal viscera limited by the lack of IV contrast. Hepatobiliary: No focal liver abnormality is seen. Status post cholecystectomy. Pancreas: Unremarkable. No surrounding inflammatory changes. Spleen: Normal in size without focal abnormality. Adrenals/Urinary Tract: Adrenal glands are unremarkable. Left kidney is atrophic. There is a simple cyst in the inferior pole the right kidney. 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 No hydronephrosis. No renal calculi. Urinary bladder is unremarkable. Stomach/Bowel: Stomach is within normal limits. No evidence of bowel wall thickening, distention, or inflammatory changes. Multiple colonic diverticula without evidence of diverticulitis. 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 Aortic atherosclerosis. Vascular patency cannot be assessed in the absence of IV contrast. Reproductive: Status post hysterectomy. No adnexal masses. Other: No abdominal wall hernia or abnormality. No abdominopelvic ascites. Musculoskeletal: Multilevel degenerative disc disease and  scoliosis of the lumbar spine. No acute abnormality identified. IMPRESSION: 1. No acute intra-abdominal pathology on a noncontrast exam. 2. Colonic diverticulosis without evidence of diverticulitis. 3. 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 4. Aortic atherosclerosis. Aortic Atherosclerosis (ICD10-I70.0). Electronically Signed   By: Emmaline Kluver M.D.   On: 04/19/2021 21:07    EKG: Ordered and pending.  Assessment/Plan Principal Problem:   Melena Active Problems:   Hypertension   COPD (chronic obstructive pulmonary disease) (HCC)   Coronary artery disease of native artery of native heart with stable angina pectoris (HCC)   Hyperlipidemia   TOSHI ISHII is a 87 y.o. female with medical history significant for CAD s/p PCI stent to LAD 07/2018, COPD, PAD, HTN, HLD who is admitted with acute GI bleed.  Melena/suspected acute upper GI bleed: Patient with new onset melena in setting of Plavix/NSAID use.  BUN elevated which supports suspicion for upper GI bleed.  Hemoglobin stable at time of admission. -GI consulted, plan for EGD tomorrow -Keep n.p.o. after midnight -Continue IV Protonix 40 mg twice daily -Repeat H&H tonight, CBC in a.m. -Transfuse with PRBC for hemoglobin <8.0 given cardiac history  CAD s/p PCI/stenting to LAD 07/2018: Denies any chest pain.  Plavix on hold as above.  COPD: Chronic and stable.  Continue Breztri and albuterol as needed.  Hypertension: Holding home lisinopril, HCTZ, Toprol-XL given occasional hypotensive episodes in the ED and risk for recurrence in setting of GI bleed.  Hyperlipidemia: Not on statin therapy due to myopathy.  DVT prophylaxis: SCDs Code Status: DNR, confirmed with patient Family Communication: Discussed with patient's daughter at bedside Disposition Plan: From home, dispo pending further GI work-up Consults called: GI Level of care: Med-Surg Admission status:  Status is: Observation  The patient  remains OBS appropriate and will d/c before 2 midnights.  Dispo: The patient is from: Home              Anticipated d/c is to: Home              Patient currently is not medically stable to d/c.   Difficult to place patient No  Darreld Mclean MD [facility]  If 7PM-7AM, please contact night-coverage [email or web address]  04/19/2021, 9:45 PM

## 2021-04-19 NOTE — ED Notes (Signed)
Patient transported to CT 

## 2021-04-19 NOTE — ED Triage Notes (Addendum)
Per EMS, patient from home, dry heaving and black stool since yesterday. Denies abdominal pain. Denies GI bleed history. A&Ox4. Ambulatory. Uses wheelchair for long distances.

## 2021-04-19 NOTE — ED Notes (Signed)
Urine specimen and culture has been sent down to lab.

## 2021-04-20 ENCOUNTER — Encounter (HOSPITAL_COMMUNITY): Payer: Self-pay | Admitting: Certified Registered Nurse Anesthetist

## 2021-04-20 ENCOUNTER — Encounter (HOSPITAL_COMMUNITY)
Admission: EM | Disposition: A | Discharge status: Home / Self Care | Payer: Self-pay | Source: Home / Self Care | Attending: Emergency Medicine

## 2021-04-20 ENCOUNTER — Encounter (HOSPITAL_COMMUNITY): Payer: Self-pay | Admitting: Internal Medicine

## 2021-04-20 DIAGNOSIS — J449 Chronic obstructive pulmonary disease, unspecified: Secondary | ICD-10-CM | POA: Diagnosis not present

## 2021-04-20 DIAGNOSIS — K257 Chronic gastric ulcer without hemorrhage or perforation: Secondary | ICD-10-CM | POA: Diagnosis not present

## 2021-04-20 DIAGNOSIS — K2950 Unspecified chronic gastritis without bleeding: Secondary | ICD-10-CM | POA: Diagnosis not present

## 2021-04-20 DIAGNOSIS — K922 Gastrointestinal hemorrhage, unspecified: Secondary | ICD-10-CM

## 2021-04-20 DIAGNOSIS — K259 Gastric ulcer, unspecified as acute or chronic, without hemorrhage or perforation: Secondary | ICD-10-CM | POA: Diagnosis not present

## 2021-04-20 DIAGNOSIS — K2980 Duodenitis without bleeding: Secondary | ICD-10-CM | POA: Diagnosis not present

## 2021-04-20 DIAGNOSIS — K921 Melena: Secondary | ICD-10-CM | POA: Diagnosis not present

## 2021-04-20 DIAGNOSIS — K2990 Gastroduodenitis, unspecified, without bleeding: Secondary | ICD-10-CM | POA: Diagnosis not present

## 2021-04-20 HISTORY — PX: BIOPSY: SHX5522

## 2021-04-20 HISTORY — PX: ESOPHAGOGASTRODUODENOSCOPY (EGD) WITH PROPOFOL: SHX5813

## 2021-04-20 LAB — COMPREHENSIVE METABOLIC PANEL
ALT: 12 U/L (ref 0–44)
AST: 16 U/L (ref 15–41)
Albumin: 3.4 g/dL — ABNORMAL LOW (ref 3.5–5.0)
Alkaline Phosphatase: 31 U/L — ABNORMAL LOW (ref 38–126)
Anion gap: 7 (ref 5–15)
BUN: 44 mg/dL — ABNORMAL HIGH (ref 8–23)
CO2: 21 mmol/L — ABNORMAL LOW (ref 22–32)
Calcium: 8.8 mg/dL — ABNORMAL LOW (ref 8.9–10.3)
Chloride: 110 mmol/L (ref 98–111)
Creatinine, Ser: 0.91 mg/dL (ref 0.44–1.00)
GFR, Estimated: >60 mL/min (ref >60)
Glucose, Bld: 106 mg/dL — ABNORMAL HIGH (ref 70–99)
Potassium: 4.3 mmol/L (ref 3.5–5.1)
Sodium: 138 mmol/L (ref 135–145)
Total Bilirubin: 0.7 mg/dL (ref 0.3–1.2)
Total Protein: 5.6 g/dL — ABNORMAL LOW (ref 6.5–8.1)

## 2021-04-20 LAB — CBC
HCT: 28.9 % — ABNORMAL LOW (ref 36.0–46.0)
Hemoglobin: 8.9 g/dL — ABNORMAL LOW (ref 12.0–15.0)
MCH: 32.5 pg (ref 26.0–34.0)
MCHC: 30.8 g/dL (ref 30.0–36.0)
MCV: 105.5 fL — ABNORMAL HIGH (ref 80.0–100.0)
Platelets: 183 10*3/uL (ref 150–400)
RBC: 2.74 MIL/uL — ABNORMAL LOW (ref 3.87–5.11)
RDW: 13.2 % (ref 11.5–15.5)
WBC: 10.6 10*3/uL — ABNORMAL HIGH (ref 4.0–10.5)
nRBC: 0 % (ref 0.0–0.2)

## 2021-04-20 LAB — HEMOGLOBIN AND HEMATOCRIT, BLOOD
HCT: 28.8 % — ABNORMAL LOW (ref 36.0–46.0)
HCT: 28.1 % — ABNORMAL LOW (ref 36.0–46.0)
HCT: 26.3 % — ABNORMAL LOW (ref 36.0–46.0)
Hemoglobin: 9.1 g/dL — ABNORMAL LOW (ref 12.0–15.0)
Hemoglobin: 9 g/dL — ABNORMAL LOW (ref 12.0–15.0)
Hemoglobin: 8.2 g/dL — ABNORMAL LOW (ref 12.0–15.0)

## 2021-04-20 LAB — FERRITIN: Ferritin: 28 ng/mL (ref 11–307)

## 2021-04-20 LAB — RETICULOCYTES
Immature Retic Fract: 19.1 % — ABNORMAL HIGH (ref 2.3–15.9)
RBC.: 2.7 MIL/uL — ABNORMAL LOW (ref 3.87–5.11)
Retic Count, Absolute: 57.5 10*3/uL (ref 19.0–186.0)
Retic Ct Pct: 2.1 % (ref 0.4–3.1)

## 2021-04-20 LAB — IRON AND TIBC
Iron: 92 ug/dL (ref 28–170)
Saturation Ratios: 21 % (ref 10.4–31.8)
TIBC: 439 ug/dL (ref 250–450)
UIBC: 347 ug/dL

## 2021-04-20 LAB — VITAMIN B12: Vitamin B-12: 233 pg/mL (ref 180–914)

## 2021-04-20 LAB — FOLATE: Folate: 13.7 ng/mL (ref >5.9)

## 2021-04-20 LAB — ABO/RH: ABO/RH(D): A POS

## 2021-04-20 SURGERY — ESOPHAGOGASTRODUODENOSCOPY (EGD) WITH PROPOFOL
Anesthesia: Moderate Sedation

## 2021-04-20 MED ORDER — SODIUM CHLORIDE 0.9 % IV SOLN
3.0000 g | Freq: Four times a day (QID) | INTRAVENOUS | Status: DC
  Administered 2021-04-21 (×2): 3 g via INTRAVENOUS
  Filled 2021-04-20 (×3): qty 8
  Filled 2021-04-20 (×2): qty 3

## 2021-04-20 MED ORDER — MELATONIN 3 MG PO TABS
3.0000 mg | ORAL_TABLET | Freq: Once | ORAL | Status: AC
  Administered 2021-04-20: 3 mg via ORAL
  Filled 2021-04-20: qty 1

## 2021-04-20 MED ORDER — MIDAZOLAM HCL (PF) 5 MG/ML IJ SOLN
INTRAMUSCULAR | Status: AC
  Filled 2021-04-20: qty 2

## 2021-04-20 MED ORDER — MIDAZOLAM HCL (PF) 5 MG/ML IJ SOLN
INTRAMUSCULAR | Status: DC | PRN
  Administered 2021-04-20 (×2): 2 mg via INTRAVENOUS

## 2021-04-20 MED ORDER — DEXTROSE IN LACTATED RINGERS 5 % IV SOLN: INTRAVENOUS | Status: AC

## 2021-04-20 MED ORDER — FENTANYL CITRATE (PF) 100 MCG/2ML IJ SOLN
INTRAMUSCULAR | Status: AC
  Filled 2021-04-20: qty 4

## 2021-04-20 MED ORDER — FENTANYL CITRATE (PF) 100 MCG/2ML IJ SOLN
INTRAMUSCULAR | Status: DC | PRN
  Administered 2021-04-20 (×2): 25 ug via INTRAVENOUS

## 2021-04-20 MED ORDER — DIPHENHYDRAMINE HCL 50 MG/ML IJ SOLN
INTRAMUSCULAR | Status: AC
  Filled 2021-04-20: qty 1

## 2021-04-20 SURGICAL SUPPLY — 14 items

## 2021-04-20 NOTE — Progress Notes (Signed)
PROGRESS NOTE  Sydney Gross HFW:[id number] DOB: 1930/04/03   PCP: Lupita Raider, MD  Patient is from: Home  DOA: 04/19/2021 LOS: 0  Chief complaints: Dark black stool  Brief Narrative / Interim history: 87 year old F with PMH of CAD s/p stent to LAD in 2019, COPD not on oxygen, PAD, HTN and HLD presenting with nausea, melena, lightheadedness and diaphoresis, and admitted for symptomatic anemia likely due to GI bleed.  Patient is on Plavix and also takes NSAIDs daily.  Patient was slightly hypotensive although H&H was at baseline.  Hemoccult was positive.  Coag labs negative.  CT abdomen and pelvis with diverticulosis but no diverticulitis.  GI, Dr. Elnoria Howard consulted by EDP and planning for EGD on 5/16  Subjective: Seen and examined earlier this morning.  She reports 3 loose bowel movements overnight. Stools were dark.  No fresh red blood or hematochezia.  She denies chest pain or shortness of breath.  Still with some dizziness.  Denies UTI symptoms.  Objective: Vitals:   04/20/21 0554 04/20/21 0729 04/20/21 0954 04/20/21 1326  BP: (!) 127/52  122/64 (!) 121/58  Pulse: 80  84 80  Resp: 18  16 16   Temp: 98.4 F (36.9 C)  98.9 F (37.2 C) 98.9 F (37.2 C)  TempSrc: Oral  Oral Oral  SpO2: 96% 97% 98% 99%    Intake/Output Summary (Last 24 hours) at 04/20/2021 1403 Last data filed at 04/20/2021 1038 Gross per 24 hour  Intake 500 ml  Output 150 ml  Net 350 ml   There were no vitals filed for this visit.  Examination:  GENERAL: No apparent distress.  Nontoxic. HEENT: MMM.  Vision and hearing grossly intact.  NECK: Supple.  No apparent JVD.  RESP: On RA.  No IWOB.  Fair aeration bilaterally. CVS:  RRR. Heart sounds normal.  ABD/GI/GU: BS+. Abd soft.  Tenderness over LLQ.  No rebound MSK/EXT:  Moves extremities. No apparent deformity. No edema.  SKIN: no apparent skin lesion or wound NEURO: Awake, alert and oriented appropriately.  No apparent focal neuro deficit. PSYCH:  Calm. Normal affect.  Procedures:  None  Microbiology summarized: COVID-19 and influenza PCR nonreactive.  Assessment & Plan: Symptomatic anemia due to acute blood loss from upper GI bleed-patient is on Plavix for CAD.  Also uses NSAIDs daily for osteoarthritis.  BUN elevated. Baseline Hgb 10-11.  Anemia panel with low ferritin.  Recent Labs    04/19/21 2003 04/20/21 0131 04/20/21 0517  HGB 10.2* 9.1* 8.9*  -Monitor H&H every 8 hours -Continue IV PPI pending EGD -Transfuse for Hgb less than 8.0 given history of CAD -Counseled against NSAID and advised to use Tylenol for pain -Continue holding Plavix  Possible acute diverticulitis: Patient has LLQ tenderness with mild leukocytosis raising concern for diverticulitis.  I have reviewed CT abdomen and pelvis with radiologist on-call and no radiologic evidence of diverticulitis but diverticulosis. -Start IV Unasyn  History of CAD s/p stent to LAD/HLD: No anginal symptoms. -Continue holding home Plavix -Resume home metoprolol and Zetia after EGD -She is on Repatha injection as well.  Chronic COPD: Stable. -Continue home breathing treatments  Essential hypertension: Normotensive. -Continue holding home medications pending EGD  There is no height or weight on file to calculate BMI.         DVT prophylaxis:  SCDs Start: 04/19/21 2241  Code Status: DNR/DNI Family Communication: Updated patient's husband at bedside Level of care: Med-Surg Status is: Observation  The patient will require care spanning > 2 midnights  and should be moved to inpatient because: Ongoing diagnostic testing needed not appropriate for outpatient work up, IV treatments appropriate due to intensity of illness or inability to take PO and Inpatient level of care appropriate due to severity of illness  Dispo: The patient is from: Home              Anticipated d/c is to: Home              Patient currently is not medically stable to d/c.   Difficult to  place patient No       Consultants:  Gastroenterology   Sch Meds:  Scheduled Meds: . fluticasone furoate-vilanterol  1 puff Inhalation Daily   And  . umeclidinium bromide  1 puff Inhalation Daily  . pantoprazole (PROTONIX) IV  40 mg Intravenous Q12H   Continuous Infusions: PRN Meds:.acetaminophen **OR** acetaminophen, albuterol, ondansetron **OR** ondansetron (ZOFRAN) IV  Antimicrobials: Anti-infectives (From admission, onward)   None       I have personally reviewed the following labs and images: CBC: Recent Labs  Lab 04/19/21 2003 04/20/21 0131 04/20/21 0517  WBC 11.9*  --  10.6*  NEUTROABS 8.5*  --   --   HGB 10.2* 9.1* 8.9*  HCT 31.4* 28.8* 28.9*  MCV 102.3*  --  105.5*  PLT 209  --  183   BMP &GFR Recent Labs  Lab 04/19/21 2003 04/20/21 0517  NA 138 138  K 4.1 4.3  CL 109 110  CO2 22 21*  GLUCOSE 105* 106*  BUN 56* 44*  CREATININE 0.96 0.91  CALCIUM 9.4 8.8*   CrCl cannot be calculated (Unknown ideal weight.). Liver & Pancreas: Recent Labs  Lab 04/19/21 2003 04/20/21 0517  AST 16 16  ALT 14 12  ALKPHOS 36* 31*  BILITOT 0.5 0.7  PROT 6.5 5.6*  ALBUMIN 4.2 3.4*   Recent Labs  Lab 04/19/21 2003  LIPASE 33   No results for input(s): AMMONIA in the last 168 hours. Diabetic: No results for input(s): HGBA1C in the last 72 hours. No results for input(s): GLUCAP in the last 168 hours. Cardiac Enzymes: No results for input(s): CKTOTAL, CKMB, CKMBINDEX, TROPONINI in the last 168 hours. No results for input(s): PROBNP in the last 8760 hours. Coagulation Profile: Recent Labs  Lab 04/19/21 2003  INR 1.0   Thyroid Function Tests: No results for input(s): TSH, T4TOTAL, FREET4, T3FREE, THYROIDAB in the last 72 hours. Lipid Profile: No results for input(s): CHOL, HDL, LDLCALC, TRIG, CHOLHDL, LDLDIRECT in the last 72 hours. Anemia Panel: Recent Labs    04/20/21 0816  VITAMINB12 233  FOLATE 13.7  FERRITIN 28  TIBC 439  IRON 92   RETICCTPCT 2.1   Urine analysis:    Component Value Date/Time   COLORURINE ORANGE (A) 10/29/2015 1955   APPEARANCEUR CLOUDY (A) 10/29/2015 1955   LABSPEC 1.012 10/29/2015 1955   PHURINE 6.0 10/29/2015 1955   GLUCOSEU NEGATIVE 10/29/2015 1955   HGBUR NEGATIVE 10/29/2015 1955   BILIRUBINUR NEGATIVE 10/29/2015 1955   KETONESUR NEGATIVE 10/29/2015 1955   PROTEINUR 30 (A) 10/29/2015 1955   UROBILINOGEN 0.2 12/09/2013 1902   NITRITE NEGATIVE 10/29/2015 1955   LEUKOCYTESUR TRACE (A) 10/29/2015 1955   Sepsis Labs: Invalid input(s): PROCALCITONIN, LACTICIDVEN  Microbiology: Recent Results (from the past 240 hour(s))  Resp Panel by RT-PCR (Flu A&B, Covid) Nasopharyngeal Swab     Status: None   Collection Time: 04/19/21  8:03 PM   Specimen: Nasopharyngeal Swab; Nasopharyngeal(NP) swabs in vial transport medium  Result Value Ref Range Status   SARS Coronavirus 2 by RT PCR NEGATIVE NEGATIVE Final    Comment: (NOTE) SARS-CoV-2 target nucleic acids are NOT DETECTED.  The SARS-CoV-2 RNA is generally detectable in upper respiratory specimens during the acute phase of infection. The lowest concentration of SARS-CoV-2 viral copies this assay can detect is 138 copies/mL. A negative result does not preclude SARS-Cov-2 infection and should not be used as the sole basis for treatment or other patient management decisions. A negative result may occur with  improper specimen collection/handling, submission of specimen other than nasopharyngeal swab, presence of viral mutation(s) within the areas targeted by this assay, and inadequate number of viral copies(<138 copies/mL). A negative result must be combined with clinical observations, patient history, and epidemiological information. The expected result is Negative.  Fact Sheet for Patients:  BloggerCourse.comhttps://www.fda.gov/media/152166/download  Fact Sheet for Healthcare Providers:  SeriousBroker.ithttps://www.fda.gov/media/152162/download  This test is no t yet  approved or cleared by the [place] FDA and  has been authorized for detection and/or diagnosis of SARS-CoV-2 by FDA under an Emergency Use Authorization (EUA). This EUA will remain  in effect (meaning this test can be used) for the duration of the COVID-19 declaration under Section 564(b)(1) of the Act, 21 U.S.C.section 360bbb-3(b)(1), unless the authorization is terminated  or revoked sooner.       Influenza A by PCR NEGATIVE NEGATIVE Final   Influenza B by PCR NEGATIVE NEGATIVE Final    Comment: (NOTE) The Xpert Xpress SARS-CoV-2/FLU/RSV plus assay is intended as an aid in the diagnosis of influenza from Nasopharyngeal swab specimens and should not be used as a sole basis for treatment. Nasal washings and aspirates are unacceptable for Xpert Xpress SARS-CoV-2/FLU/RSV testing.  Fact Sheet for Patients: BloggerCourse.comhttps://www.fda.gov/media/152166/download  Fact Sheet for Healthcare Providers: SeriousBroker.ithttps://www.fda.gov/media/152162/download  This test is not yet approved or cleared by the [place] FDA and has been authorized for detection and/or diagnosis of SARS-CoV-2 by FDA under an Emergency Use Authorization (EUA). This EUA will remain in effect (meaning this test can be used) for the duration of the COVID-19 declaration under Section 564(b)(1) of the Act, 21 U.S.C. section 360bbb-3(b)(1), unless the authorization is terminated or revoked.  Performed at [facility], [address] [place], [place] [address]     Radiology Studies: CT ABDOMEN PELVIS WO CONTRAST  Result Date: 04/19/2021 CLINICAL DATA:  Dry heaving and black stool. Denies GI bleed history EXAM: CT ABDOMEN AND PELVIS WITHOUT CONTRAST TECHNIQUE: Multidetector CT imaging of the abdomen and pelvis was performed following the standard protocol without IV contrast. COMPARISON:  CT abdomen pelvis 08/24/2019 FINDINGS: Lower chest: No acute abnormality. Evaluation of the abdominal viscera limited by the  lack of IV contrast. Hepatobiliary: No focal liver abnormality is seen. Status post cholecystectomy. Pancreas: Unremarkable. No surrounding inflammatory changes. Spleen: Normal in size without focal abnormality. Adrenals/Urinary Tract: Adrenal glands are unremarkable. Left kidney is atrophic. There is a simple cyst in the inferior pole the right kidney. 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 No hydronephrosis. No renal calculi. Urinary bladder is unremarkable. Stomach/Bowel: Stomach is within normal limits. No evidence of bowel wall thickening, distention, or inflammatory changes. Multiple colonic diverticula without evidence of diverticulitis. 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 Aortic atherosclerosis. Vascular patency cannot be assessed in the absence of IV contrast. Reproductive: Status post hysterectomy. No adnexal masses. Other: No abdominal wall hernia or abnormality. No abdominopelvic ascites.  Musculoskeletal: Multilevel degenerative disc disease and scoliosis of the lumbar spine. No acute abnormality identified. IMPRESSION: 1. No acute intra-abdominal pathology on a noncontrast exam. 2. Colonic diverticulosis without evidence of diverticulitis. 3. 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 4. Aortic atherosclerosis. Aortic Atherosclerosis (ICD10-I70.0). Electronically Signed   By: Emmaline Kluver M.D.   On: 04/19/2021 21:07      Xue Low T. Andry Bogden [facility]  If 7PM-7AM, please contact night-coverage [email or web address] 04/20/2021, 2:03 PM

## 2021-04-20 NOTE — Consult Note (Signed)
Reason for Consult: Melena and anemia Referring Physician: [facility]  Theresia Lo HPI: This is an 87 year old female with a PMH of COPD, HTN, CAD s/p stenting and hyperlipidemia admitted for melenic stools.  At home she started to experience melenic stools while taking Plavix.  For the past several months she takes NSAIDs twice per day to help with her leg pain.  The patient did report having problems with dizziness when standing.  Past Medical History:  Diagnosis Date  . Allergic rhinitis   . Arthritis    "maybe a little in my knees and back" (07/17/2018)  . Chronic lower back pain   . Colitis 12/2013   "never had this before"  . COPD (chronic obstructive pulmonary disease) (HCC)    "questionable/Dr. Katrinka Blazing" (07/17/2018)  . Coronary artery disease   . Exertional shortness of breath    "off and on" (07/17/2018)  . Family history of anesthesia complication    "mother; think they gave her too much; c/o pain after OR; gave her more RX; they had to bring her back real quick"  . H/O calcium pyrophosphate deposition disease (CPPD)   . Heart murmur   . Hiatal hernia   . Hyperlipidemia   . Hypertension   . Numbness in feet   . On home oxygen therapy    "I've used it 2 times in 2 yrs" (07/17/2018)  . PAD (peripheral artery disease) (HCC)   . Pneumonia    "1st grade & 4th grade" (07/17/2018)  . Proteinuria   . Shingles   . Stenosis of tear duct     Past Surgical History:  Procedure Laterality Date  . ABDOMINAL HYSTERECTOMY  1960's  . APPENDECTOMY    . BACK SURGERY    . BLEPHAROPLASTY Bilateral 05/2017  . CARDIAC CATHETERIZATION    . CATARACT EXTRACTION, BILATERAL Bilateral   . CESAREAN SECTION  1957; 1960  . CORONARY STENT INTERVENTION N/A 07/17/2018   Procedure: CORONARY STENT INTERVENTION;  Surgeon: Lyn Records, MD;  Location: [facility] INVASIVE CV LAB;  Service: Cardiovascular;  Laterality: N/A;  . DACROCYSTORHINOSTOMY W/ JONES TUBE Bilateral 05/2017   "put tear duct in my  eyes"  . EYE SURGERY    . LAPAROSCOPIC CHOLECYSTECTOMY  ~ 2009  . LUMBAR DISC SURGERY  ?2004  . RIGHT/LEFT HEART CATH AND CORONARY ANGIOGRAPHY N/A 07/17/2018   Procedure: RIGHT/LEFT HEART CATH AND CORONARY ANGIOGRAPHY;  Surgeon: Lyn Records, MD;  Location: [facility] INVASIVE CV LAB;  Service: Cardiovascular;  Laterality: N/A;  . TONSILLECTOMY AND ADENOIDECTOMY  ?1940's    Family History  Problem Relation Age of Onset  . Emphysema Father 46  . CVA Mother   . Cancer Brother        ? type    Social History:  reports that she has never smoked. She has never used smokeless tobacco. She reports current alcohol use of about 4.0 standard drinks of alcohol per week. She reports that she does not use drugs.  Allergies:  Allergies  Allergen Reactions  . Brilinta [Ticagrelor] Shortness Of Breath  . Codeine Nausea And Vomiting  . Crestor [Rosuvastatin] Other (See Comments)    MYALGIAS  . Darvon [Propoxyphene] Nausea Only  . Lipitor [Atorvastatin] Other (See Comments)    MYALGIAS  . Zocor [Simvastatin] Other (See Comments)    MYALGIAS  . Nexlizet [Bempedoic Acid-Ezetimibe]     Muscle aches (tolerates Zetia just fine, allergy is with bempedoic acid component)    Medications:  Scheduled: . [facility]  Hold] fluticasone furoate-vilanterol  1 puff Inhalation Daily   And  . [MAR Hold] umeclidinium bromide  1 puff Inhalation Daily  . [MAR Hold] pantoprazole (PROTONIX) IV  40 mg Intravenous Q12H   Continuous: . [MAR Hold] ampicillin-sulbactam (UNASYN) IV    . dextrose 5% lactated ringers 100 mL/hr at 04/20/21 1431    Results for orders placed or performed during the hospital encounter of 04/19/21 (from the past 24 hour(s))  CBC with Differential/Platelet     Status: Abnormal   Collection Time: 04/19/21  8:03 PM  Result Value Ref Range   WBC 11.9 (H) 4.0 - 10.5 K/uL   RBC 3.07 (L) 3.87 - 5.11 MIL/uL   Hemoglobin 10.2 (L) 12.0 - 15.0 g/dL   HCT 16.131.4 (L) 09.636.0 - 04.546.0 %   MCV 102.3 (H) 80.0 - 100.0  fL   MCH 33.2 26.0 - 34.0 pg   MCHC 32.5 30.0 - 36.0 g/dL   RDW 40.913.1 81.111.5 - 91.415.5 %   Platelets 209 150 - 400 K/uL   nRBC 0.0 0.0 - 0.2 %   Neutrophils Relative % 72 %   Neutro Abs 8.5 (H) 1.7 - 7.7 K/uL   Lymphocytes Relative 21 %   Lymphs Abs 2.5 0.7 - 4.0 K/uL   Monocytes Relative 6 %   Monocytes Absolute 0.7 0.1 - 1.0 K/uL   Eosinophils Relative 1 %   Eosinophils Absolute 0.1 0.0 - 0.5 K/uL   Basophils Relative 0 %   Basophils Absolute 0.0 0.0 - 0.1 K/uL   Immature Granulocytes 0 %   Abs Immature Granulocytes 0.05 0.00 - 0.07 K/uL  Comprehensive metabolic panel     Status: Abnormal   Collection Time: 04/19/21  8:03 PM  Result Value Ref Range   Sodium 138 135 - 145 mmol/L   Potassium 4.1 3.5 - 5.1 mmol/L   Chloride 109 98 - 111 mmol/L   CO2 22 22 - 32 mmol/L   Glucose, Bld 105 (H) 70 - 99 mg/dL   BUN 56 (H) 8 - 23 mg/dL   Creatinine, Ser 7.820.96 0.44 - 1.00 mg/dL   Calcium 9.4 8.9 - 95.610.3 mg/dL   Total Protein 6.5 6.5 - 8.1 g/dL   Albumin 4.2 3.5 - 5.0 g/dL   AST 16 15 - 41 U/L   ALT 14 0 - 44 U/L   Alkaline Phosphatase 36 (L) 38 - 126 U/L   Total Bilirubin 0.5 0.3 - 1.2 mg/dL   GFR, Estimated 57 (L) >60 mL/min   Anion gap 7 5 - 15  Type and screen     Status: None   Collection Time: 04/19/21  8:03 PM  Result Value Ref Range   ABO/RH(D) A POS    Antibody Screen NEG    Sample Expiration      04/22/2021,2359 Performed at [facility], [address] [place], [place] [address]   Protime-INR     Status: None   Collection Time: 04/19/21  8:03 PM  Result Value Ref Range   Prothrombin Time 13.2 11.4 - 15.2 seconds   INR 1.0 0.8 - 1.2  Lipase, blood     Status: None   Collection Time: 04/19/21  8:03 PM  Result Value Ref Range   Lipase 33 11 - 51 U/L  Resp Panel by RT-PCR (Flu A&B, Covid) Nasopharyngeal Swab     Status: None   Collection Time: 04/19/21  8:03 PM   Specimen: Nasopharyngeal Swab; Nasopharyngeal(NP) swabs in vial transport medium  Result  Value Ref Range   SARS Coronavirus 2 by RT PCR NEGATIVE NEGATIVE   Influenza A by PCR NEGATIVE NEGATIVE   Influenza B by PCR NEGATIVE NEGATIVE  POC occult blood, ED     Status: Abnormal   Collection Time: 04/19/21  9:09 PM  Result Value Ref Range   Fecal Occult Bld POSITIVE (A) NEGATIVE  Hemoglobin and hematocrit, blood     Status: Abnormal   Collection Time: 04/20/21  1:31 AM  Result Value Ref Range   Hemoglobin 9.1 (L) 12.0 - 15.0 g/dL   HCT 37.0 (L) 48.8 - 89.1 %  CBC     Status: Abnormal   Collection Time: 04/20/21  5:17 AM  Result Value Ref Range   WBC 10.6 (H) 4.0 - 10.5 K/uL   RBC 2.74 (L) 3.87 - 5.11 MIL/uL   Hemoglobin 8.9 (L) 12.0 - 15.0 g/dL   HCT 69.4 (L) 50.3 - 88.8 %   MCV 105.5 (H) 80.0 - 100.0 fL   MCH 32.5 26.0 - 34.0 pg   MCHC 30.8 30.0 - 36.0 g/dL   RDW 28.0 03.4 - 91.7 %   Platelets 183 150 - 400 K/uL   nRBC 0.0 0.0 - 0.2 %  Comprehensive metabolic panel     Status: Abnormal   Collection Time: 04/20/21  5:17 AM  Result Value Ref Range   Sodium 138 135 - 145 mmol/L   Potassium 4.3 3.5 - 5.1 mmol/L   Chloride 110 98 - 111 mmol/L   CO2 21 (L) 22 - 32 mmol/L   Glucose, Bld 106 (H) 70 - 99 mg/dL   BUN 44 (H) 8 - 23 mg/dL   Creatinine, Ser 9.15 0.44 - 1.00 mg/dL   Calcium 8.8 (L) 8.9 - 10.3 mg/dL   Total Protein 5.6 (L) 6.5 - 8.1 g/dL   Albumin 3.4 (L) 3.5 - 5.0 g/dL   AST 16 15 - 41 U/L   ALT 12 0 - 44 U/L   Alkaline Phosphatase 31 (L) 38 - 126 U/L   Total Bilirubin 0.7 0.3 - 1.2 mg/dL   GFR, Estimated >05 >69 mL/min   Anion gap 7 5 - 15  ABO/Rh     Status: None   Collection Time: 04/20/21  5:17 AM  Result Value Ref Range   ABO/RH(D)      A POS Performed at [facility], [address], [place], [place] [address]   Vitamin B12     Status: None   Collection Time: 04/20/21  8:16 AM  Result Value Ref Range   Vitamin B-12 233 180 - 914 pg/mL  Folate     Status: None   Collection Time: 04/20/21  8:16 AM  Result Value Ref Range    Folate 13.7 >5.9 ng/mL  Iron and TIBC     Status: None   Collection Time: 04/20/21  8:16 AM  Result Value Ref Range   Iron 92 28 - 170 ug/dL   TIBC 165 537 - 482 ug/dL   Saturation Ratios 21 10.4 - 31.8 %   UIBC 347 ug/dL  Ferritin     Status: None   Collection Time: 04/20/21  8:16 AM  Result Value Ref Range   Ferritin 28 11 - 307 ng/mL  Reticulocytes     Status: Abnormal   Collection Time: 04/20/21  8:16 AM  Result Value Ref Range   Retic Ct Pct 2.1 0.4 - 3.1 %   RBC. 2.70 (L) 3.87 - 5.11 MIL/uL   Retic  Count, Absolute 57.5 19.0 - 186.0 K/uL   Immature Retic Fract 19.1 (H) 2.3 - 15.9 %  Hemoglobin and hematocrit, blood     Status: Abnormal   Collection Time: 04/20/21  2:56 PM  Result Value Ref Range   Hemoglobin 9.0 (L) 12.0 - 15.0 g/dL   HCT 35.0 (L) 09.3 - 81.8 %     CT ABDOMEN PELVIS WO CONTRAST  Result Date: 04/19/2021 CLINICAL DATA:  Dry heaving and black stool. Denies GI bleed history EXAM: CT ABDOMEN AND PELVIS WITHOUT CONTRAST TECHNIQUE: Multidetector CT imaging of the abdomen and pelvis was performed following the standard protocol without IV contrast. COMPARISON:  CT abdomen pelvis 08/24/2019 FINDINGS: Lower chest: No acute abnormality. Evaluation of the abdominal viscera limited by the lack of IV contrast. Hepatobiliary: No focal liver abnormality is seen. Status post cholecystectomy. Pancreas: Unremarkable. No surrounding inflammatory changes. Spleen: Normal in size without focal abnormality. Adrenals/Urinary Tract: Adrenal glands are unremarkable. Left kidney is atrophic. There is a simple cyst in the inferior pole the right kidney. 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 No hydronephrosis. No renal calculi. Urinary bladder is unremarkable. Stomach/Bowel: Stomach is within normal limits. No evidence of bowel wall thickening, distention, or inflammatory changes. Multiple colonic diverticula without  evidence of diverticulitis. 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 Aortic atherosclerosis. Vascular patency cannot be assessed in the absence of IV contrast. Reproductive: Status post hysterectomy. No adnexal masses. Other: No abdominal wall hernia or abnormality. No abdominopelvic ascites. Musculoskeletal: Multilevel degenerative disc disease and scoliosis of the lumbar spine. No acute abnormality identified. IMPRESSION: 1. No acute intra-abdominal pathology on a noncontrast exam. 2. Colonic diverticulosis without evidence of diverticulitis. 3. 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 4. Aortic atherosclerosis. Aortic Atherosclerosis (ICD10-I70.0). Electronically Signed   By: Emmaline Kluver M.D.   On: 04/19/2021 21:07    ROS:  As stated above in the HPI otherwise negative.  Blood pressure (!) 163/68, pulse 89, temperature 99 F (37.2 C), temperature source Oral, resp. rate (!) 23, height 4\' 11"  (1.499 m), weight 52.6 kg, SpO2 96 %.    PE: Gen: NAD, Alert and Oriented HEENT:  [place]/AT, EOMI Neck: Supple, no LAD Lungs: CTA Bilaterally CV: RRR without M/G/R ABD: Soft, NTND, +BS Ext: No C/C/E  Assessment/Plan: 1) Melena. 2) Heme positive stool. 3) Anemia. 4) NSAID use.   It is likely that the use of NSAIDs is the source of her melenic stools.  Further evaluation with an EGD will be performed.  Plan: 1) EGD now.  Lyndall Bellot D 04/20/2021, 5:04 PM

## 2021-04-20 NOTE — Progress Notes (Signed)
Pharmacy Antibiotic Note  Sydney Gross is a 87 y.o. female admitted on 04/19/2021 with IAI, possible diverticulitis.  Pharmacy has been consulted for Unasyn dosing.  Plan: Unasyn 3g IV q6h Follow up renal function, culture results, and clinical course.      Temp (24hrs), Avg:98.7 F (37.1 C), Min:98 F (36.7 C), Max:99.3 F (37.4 C)  Recent Labs  Lab 04/19/21 2003 04/20/21 0517  WBC 11.9* 10.6*  CREATININE 0.96 0.91    CrCl cannot be calculated (Unknown ideal weight.).   Most recent weight 116 lbs ( 52 kg), CrCl ~ 35 ml/min  Allergies  Allergen Reactions  . Brilinta [Ticagrelor] Shortness Of Breath  . Codeine Nausea And Vomiting  . Crestor [Rosuvastatin] Other (See Comments)    MYALGIAS  . Darvon [Propoxyphene] Nausea Only  . Lipitor [Atorvastatin] Other (See Comments)    MYALGIAS  . Zocor [Simvastatin] Other (See Comments)    MYALGIAS  . Nexlizet [Bempedoic Acid-Ezetimibe]     Muscle aches (tolerates Zetia just fine, allergy is with bempedoic acid component)    Antimicrobials this admission: 5/16 Unasyn >>   Dose adjustments this admission:   Microbiology results:   Thank you for allowing pharmacy to be a part of this patient's care.  Lynann Beaver PharmD, BCPS Clinical Pharmacist [facility] main pharmacy [phone] 04/20/2021 2:47 PM

## 2021-04-20 NOTE — Op Note (Signed)
[facility] Patient Name: Sydney Gross Procedure Date: 04/20/2021 MRN: [id number] Attending MD: Jeani Hawking , MD Date of Birth: 06/16/1930 CSN: [id number] Age: 87 Admit Type: Inpatient Procedure:                Upper GI endoscopy Indications:              Melena Providers:                Jeani Hawking, MD, Vicki Mallet, RN, Arlee Muslim                            Tech., Technician Referring MD:              Medicines:                Fentanyl 50 micrograms IV, Midazolam 4 mg IV Complications:            No immediate complications. Estimated Blood Loss:     Estimated blood loss: none. Procedure:                Pre-Anesthesia Assessment:                           - Prior to the procedure, a History and Physical                            was performed, and patient medications and                            allergies were reviewed. The patient's tolerance of                            previous anesthesia was also reviewed. The risks                            and benefits of the procedure and the sedation                            options and risks were discussed with the patient.                            All questions were answered, and informed consent                            was obtained. Prior Anticoagulants: The patient has                            taken Plavix (clopidogrel), last dose was 1 day                            prior to procedure. ASA Grade Assessment: III - A                            patient with severe systemic disease. After  reviewing the risks and benefits, the patient was                            deemed in satisfactory condition to undergo the                            procedure.                           - Sedation was administered by an anesthesia                            professional. Deep sedation was attained.                           After obtaining informed consent, the endoscope was                             passed under direct vision. Throughout the                            procedure, the patient's blood pressure, pulse, and                            oxygen saturations were monitored continuously. The                            GIF-H190 ([id number] Olympus gastroscope was                            introduced through the mouth, and advanced to the                            third part of duodenum. The upper GI endoscopy was                            accomplished without difficulty. The patient                            tolerated the procedure well. Scope In: Scope Out: Findings:      The esophagus was normal.      One non-bleeding cratered gastric ulcer with a clean ulcer base (Forrest       Class III) was found in the gastric antrum. The lesion was 8 mm in       largest dimension. Biopsies were taken with a cold forceps for       Helicobacter pylori testing.      Diffuse moderately erythematous mucosa without active bleeding and with       no stigmata of bleeding was found in the duodenal bulb. Impression:               - Normal esophagus.                           - Non-bleeding gastric ulcer with a clean ulcer  base (Forrest Class III). Biopsied.                           - Erythematous duodenopathy. Moderate Sedation:      Moderate (conscious) sedation was administered by the endoscopy nurse       and supervised by the endoscopist. The following parameters were       monitored: oxygen saturation, heart rate, blood pressure, and response       to care. Recommendation:           - Return patient to hospital ward for ongoing care.                           - Resume regular diet.                           - Continue present medications.                           - Await pathology results.                           - Avoid NSAIDs.                           - Follow up with Dr. Loreta Ave in 2 weeks.                           - Okay to D/C home tomrrow  if HGB stable.                           - Signing off.                           - PPI QD. Procedure Code(s):        --- Professional ---                           [phone], Esophagogastroduodenoscopy, flexible,                            transoral; with biopsy, single or multiple Diagnosis Code(s):        --- Professional ---                           K25.9, Gastric ulcer, unspecified as acute or                            chronic, without hemorrhage or perforation                           K31.89, Other diseases of stomach and duodenum                           K92.1, Melena (includes Hematochezia) CPT copyright 2019 American Medical Association. All rights reserved. The codes documented in this report are preliminary and upon coder review may  be revised to meet current compliance requirements.  Jeani HawkingPatrick Leontine Radman, MD Jeani HawkingPatrick Kadie Balestrieri, MD 04/20/2021 5:31:13 PM This report has been signed electronically. Number of Addenda: 0

## 2021-04-21 ENCOUNTER — Encounter (HOSPITAL_COMMUNITY): Payer: Self-pay | Admitting: Gastroenterology

## 2021-04-21 DIAGNOSIS — K922 Gastrointestinal hemorrhage, unspecified: Secondary | ICD-10-CM | POA: Diagnosis not present

## 2021-04-21 DIAGNOSIS — D62 Acute posthemorrhagic anemia: Secondary | ICD-10-CM

## 2021-04-21 DIAGNOSIS — I10 Essential (primary) hypertension: Secondary | ICD-10-CM

## 2021-04-21 DIAGNOSIS — K2980 Duodenitis without bleeding: Secondary | ICD-10-CM | POA: Diagnosis present

## 2021-04-21 DIAGNOSIS — K257 Chronic gastric ulcer without hemorrhage or perforation: Secondary | ICD-10-CM

## 2021-04-21 DIAGNOSIS — J449 Chronic obstructive pulmonary disease, unspecified: Secondary | ICD-10-CM

## 2021-04-21 DIAGNOSIS — K921 Melena: Secondary | ICD-10-CM | POA: Diagnosis not present

## 2021-04-21 DIAGNOSIS — K259 Gastric ulcer, unspecified as acute or chronic, without hemorrhage or perforation: Secondary | ICD-10-CM | POA: Diagnosis present

## 2021-04-21 DIAGNOSIS — D649 Anemia, unspecified: Secondary | ICD-10-CM

## 2021-04-21 LAB — RENAL FUNCTION PANEL
Albumin: 3.2 g/dL — ABNORMAL LOW (ref 3.5–5.0)
Anion gap: 5 (ref 5–15)
BUN: 19 mg/dL (ref 8–23)
CO2: 25 mmol/L (ref 22–32)
Calcium: 8.8 mg/dL — ABNORMAL LOW (ref 8.9–10.3)
Chloride: 110 mmol/L (ref 98–111)
Creatinine, Ser: 0.75 mg/dL (ref 0.44–1.00)
GFR, Estimated: >60 mL/min (ref >60)
Glucose, Bld: 101 mg/dL — ABNORMAL HIGH (ref 70–99)
Phosphorus: 2.5 mg/dL (ref 2.5–4.6)
Potassium: 3.8 mmol/L (ref 3.5–5.1)
Sodium: 140 mmol/L (ref 135–145)

## 2021-04-21 LAB — CBC
HCT: 24.4 % — ABNORMAL LOW (ref 36.0–46.0)
Hemoglobin: 7.9 g/dL — ABNORMAL LOW (ref 12.0–15.0)
MCH: 33.3 pg (ref 26.0–34.0)
MCHC: 32.4 g/dL (ref 30.0–36.0)
MCV: 103 fL — ABNORMAL HIGH (ref 80.0–100.0)
Platelets: 152 10*3/uL (ref 150–400)
RBC: 2.37 MIL/uL — ABNORMAL LOW (ref 3.87–5.11)
RDW: 13.2 % (ref 11.5–15.5)
WBC: 8.3 10*3/uL (ref 4.0–10.5)
nRBC: 0 % (ref 0.0–0.2)

## 2021-04-21 LAB — HEMOGLOBIN AND HEMATOCRIT, BLOOD
HCT: 25.2 % — ABNORMAL LOW (ref 36.0–46.0)
HCT: 25.2 % — ABNORMAL LOW (ref 36.0–46.0)
Hemoglobin: 8 g/dL — ABNORMAL LOW (ref 12.0–15.0)
Hemoglobin: 8 g/dL — ABNORMAL LOW (ref 12.0–15.0)

## 2021-04-21 LAB — MAGNESIUM: Magnesium: 1.7 mg/dL (ref 1.7–2.4)

## 2021-04-21 MED ORDER — AMOXICILLIN-POT CLAVULANATE 875-125 MG PO TABS: 1.0000 | ORAL_TABLET | Freq: Two times a day (BID) | ORAL | 0 refills | Status: AC

## 2021-04-21 MED ORDER — CLOPIDOGREL BISULFATE 75 MG PO TABS: 1.0000 | ORAL_TABLET | Freq: Every day | ORAL | 2 refills | Status: DC

## 2021-04-21 MED ORDER — PANTOPRAZOLE SODIUM 40 MG PO TBEC: DELAYED_RELEASE_TABLET | ORAL | 0 refills | Status: DC

## 2021-04-21 MED ORDER — ACETAMINOPHEN 325 MG PO TABS: 650.0000 mg | ORAL_TABLET | Freq: Four times a day (QID) | ORAL | 0 refills | Status: AC | PRN

## 2021-04-21 NOTE — Discharge Instructions (Signed)
Peptic Ulcer  A peptic ulcer is a painful sore in the lining of your stomach or the first part of your small intestine. What are the causes? Common causes of this condition include:  An infection.  Using certain pain medicines too often or too much. What increases the risk? You are more likely to get this condition if you:  Smoke.  Have a family history of ulcer disease.  Drink alcohol.  Have been hospitalized in an intensive care unit (ICU). What are the signs or symptoms? Symptoms include:  Burning pain in the area between the chest and the belly button. The pain may: ? Not go away (be persistent). ? Be worse when your stomach is empty. ? Be worse at night.  Heartburn.  Feeling sick to your stomach (nauseous) and throwing up (vomiting).  Bloating. If the ulcer results in bleeding, it can cause you to:  Have poop (stool) that is black and looks like tar.  Throw up bright red blood.  Throw up material that looks like coffee grounds. How is this treated? Treatment for this condition may include:  Stopping things that can cause the ulcer, such as: ? Smoking. ? Using pain medicines.  Medicines to reduce stomach acid.  Antibiotic medicines if the ulcer is caused by an infection.  A procedure that is done using a small, flexible tube that has a camera at the end (upper endoscopy). This may be done if you have a bleeding ulcer.  Surgery. This may be needed if: ? You have a lot of bleeding. ? The ulcer caused a hole somewhere in the digestive system. Follow these instructions at home:  Do not drink alcohol if your doctor tells you not to drink.  Limit how much caffeine you take in.  Do not use any products that contain nicotine or tobacco, such as cigarettes, e-cigarettes, and chewing tobacco. If you need help quitting, ask your doctor.  Take over-the-counter and prescription medicines only as told by your doctor. ? Do not stop or change your medicines unless  you talk with your doctor about it first. ? Do not take aspirin, ibuprofen, or other NSAIDs unless your doctor told you to do so.  Keep all follow-up visits as told by your doctor. This is important. Contact a doctor if:  You do not get better in 7 days after you start treatment.  You keep having an upset stomach (indigestion) or heartburn. Get help right away if:  You have sudden, sharp pain in your belly (abdomen).  You have belly pain that does not go away.  You have bloody poop (stool) or black, tarry poop.  You throw up blood. It may look like coffee grounds.  You feel light-headed or feel like you may pass out (faint).  You get weak.  You get sweaty or feel sticky and cold to the touch (clammy). Summary  Symptoms of a peptic ulcer include burning pain in the area between the chest and the belly button.  Take medicines only as told by your doctor.  Limit how much alcohol and caffeine you have.  Keep all follow-up visits as told by your doctor. This information is not intended to replace advice given to you by your health care provider. Make sure you discuss any questions you have with your health care provider. Document Revised: 05/30/2018 Document Reviewed: 05/30/2018 Elsevier Patient Education  2021 [employer] Inc[employer]   Food Choices for Gastroesophageal Reflux Disease, Adult When you have gastroesophageal reflux disease (GERD), the foods you  eat and your eating habits are very important. Choosing the right foods can help ease your discomfort. Think about working with a food expert (dietitian) to help you make good choices. What are tips for following this plan? Reading food labels  Look for foods that are low in saturated fat. Foods that may help with your symptoms include: ? Foods that have less than 5% of daily value (DV) of fat. ? Foods that have 0 grams of trans fat. Cooking  Do not fry your food.  Cook your food by baking, steaming, grilling, or broiling.  These are all methods that do not need a lot of fat for cooking.  To add flavor, try to use herbs that are low in spice and acidity. Meal planning  Choose healthy foods that are low in fat, such as: ? Fruits and vegetables. ? Whole grains. ? Low-fat dairy products. ? Lean meats, fish, and poultry.  Eat small meals often instead of eating 3 large meals each day. Eat your meals slowly in a place where you are relaxed. Avoid bending over or lying down until 2-3 hours after eating.  Limit high-fat foods such as fatty meats or fried foods.  Limit your intake of fatty foods, such as oils, butter, and shortening.  Avoid the following as told by your doctor: ? Foods that cause symptoms. These may be different for different people. Keep a food diary to keep track of foods that cause symptoms. ? Alcohol. ? Drinking a lot of liquid with meals. ? Eating meals during the 2-3 hours before bed.   Lifestyle  Stay at a healthy weight. Ask your doctor what weight is healthy for you. If you need to lose weight, work with your doctor to do so safely.  Exercise for at least 30 minutes on 5 or more days each week, or as told by your doctor.  Wear loose-fitting clothes.  Do not smoke or use any products that contain nicotine or tobacco. If you need help quitting, ask your doctor.  Sleep with the head of your bed higher than your feet. Use a wedge under the mattress or blocks under the bed frame to raise the head of the bed.  Chew sugar-free gum after meals. What foods should eat? Eat a healthy, well-balanced diet of fruits, vegetables, whole grains, low-fat dairy products, lean meats, fish, and poultry. Each person is different. Foods that may cause symptoms in one person may not cause any symptoms in another person. Work with your doctor to find foods that are safe for you. The items listed above may not be a complete list of what you can eat and drink. Contact a food expert for more options.   What  foods should I avoid? Limiting some of these foods may help in managing the symptoms of GERD. Everyone is different. Talk with a food expert or your doctor to help you find the exact foods to avoid, if any. Fruits Any fruits prepared with added fat. Any fruits that cause symptoms. For some people, this may include citrus fruits, such as oranges, grapefruit, pineapple, and lemons. Vegetables Deep-fried vegetables. [place] fries. Any vegetables prepared with added fat. Any vegetables that cause symptoms. For some people, this may include tomatoes and tomato products, chili peppers, onions and garlic, and horseradish. Grains Pastries or quick breads with added fat. Meats and other proteins High-fat meats, such as fatty beef or pork, hot dogs, ribs, ham, sausage, salami, and bacon. Fried meat or protein, including fried fish and  fried chicken. Nuts and nut butters, in large amounts. Dairy Whole milk and chocolate milk. Sour cream. Cream. Ice cream. Cream cheese. Milkshakes. Fats and oils Butter. Margarine. Shortening. Ghee. Beverages Coffee and tea, with or without caffeine. Carbonated beverages. Sodas. Energy drinks. Fruit juice made with acidic fruits, such as orange or grapefruit. Tomato juice. Alcoholic drinks. Sweets and desserts Chocolate and cocoa. Donuts. Seasonings and condiments Pepper. Peppermint and spearmint. Added salt. Any condiments, herbs, or seasonings that cause symptoms. For some people, this may include curry, hot sauce, or vinegar-based salad dressings. The items listed above may not be a complete list of what you should not eat and drink. Contact a food expert for more options. Questions to ask your doctor Diet and lifestyle changes are often the first steps that are taken to manage symptoms of GERD. If diet and lifestyle changes do not help, talk with your doctor about taking medicines. Where to find more information  International Foundation for Gastrointestinal  Disorders: aboutgerd.org Summary  When you have GERD, food and lifestyle choices are very important in easing your symptoms.  Eat small meals often instead of 3 large meals a day. Eat your meals slowly and in a place where you are relaxed.  Avoid bending over or lying down until 2-3 hours after eating.  Limit high-fat foods such as fatty meats or fried foods. This information is not intended to replace advice given to you by your health care provider. Make sure you discuss any questions you have with your health care provider. Document Revised: 06/02/2020 Document Reviewed: 06/02/2020 Elsevier Patient Education  2021 [employer].

## 2021-04-21 NOTE — Evaluation (Signed)
Physical Therapy Evaluation Patient Details Name: Sydney Gross MRN: [id number] DOB: 1930/05/28 Today's Date: 04/21/2021   History of Present Illness  87 year old Fpresenting with nausea, melena, lightheadedness and diaphoresis, and admitted for symptomatic anemia. Dx GIB 2* use of NSAIDs.  Pt with PMH of CAD s/p stent to LAD in 2019, COPD not on oxygen, PAD, HTN and HLD  Clinical Impression  Pt admitted with above diagnosis. Pt ambulated 30' without an assistive device, no loss of balance, distance limited by fatigue. Pt reports she walks short distances only at baseline 2* "poor circulation in the legs". Pt reported nausea and dizziness after walking. BP in sitting 149/67, SaO2 98% on room air, HR 78. RN notified. From a mobility standpoint, she is ready to DC home.  Pt currently with functional limitations due to the deficits listed below (see PT Problem List). Pt will benefit from skilled PT to increase their independence and safety with mobility to allow discharge to the venue listed below.       Follow Up Recommendations No PT follow up    Equipment Recommendations  None recommended by PT    Recommendations for Other Services       Precautions / Restrictions Precautions Precautions: Fall Precaution Comments: pt denies falls in past 1 year Restrictions Weight Bearing Restrictions: No      Mobility  Bed Mobility               General bed mobility comments: up in bathroom    Transfers Overall transfer level: Independent Equipment used: None                Ambulation/Gait Ambulation/Gait assistance: Supervision Gait Distance (Feet): 75 Feet Assistive device: None Gait Pattern/deviations: Step-through pattern;Decreased stride length Gait velocity: decr   General Gait Details: steady, no loss of balance, no dyspnea, distance limited by fatigue; after ambulating pt reported dizziness and nausea while in recliner, BP 149/67, SaO2 98% on room air, HR 78. RN  notified.  Stairs            Wheelchair Mobility    Modified Rankin (Stroke Patients Only)       Balance Overall balance assessment: Independent                                           Pertinent Vitals/Pain Pain Assessment: No/denies pain    Home Living Family/patient expects to be discharged to:: Private residence   Available Help at Discharge: Family;Available 24 hours/day Type of Home: House Home Access: Ramped entrance     Home Layout: One level Home Equipment: Walker - 2 wheels;Wheelchair - manual      Prior Function Level of Independence: Independent         Comments: walks short distances due to LE pain "from bad circulation", independent ADLs     Hand Dominance        Extremity/Trunk Assessment   Upper Extremity Assessment Upper Extremity Assessment: Overall WFL for tasks assessed    Lower Extremity Assessment Lower Extremity Assessment: Overall WFL for tasks assessed    Cervical / Trunk Assessment Cervical / Trunk Assessment: Normal  Communication   Communication: No difficulties  Cognition Arousal/Alertness: Awake/alert Behavior During Therapy: WFL for tasks assessed/performed Overall Cognitive Status: Within Functional Limits for tasks assessed  General Comments      Exercises     Assessment/Plan    PT Assessment Patient needs continued PT services  PT Problem List Decreased activity tolerance;Decreased mobility       PT Treatment Interventions Therapeutic activities;Therapeutic exercise;Gait training    PT Goals (Current goals can be found in the Care Plan section)  Acute Rehab PT Goals Patient Stated Goal: to go home PT Goal Formulation: With patient/family Time For Goal Achievement: 05/05/21 Potential to Achieve Goals: Good    Frequency Min 3X/week   Barriers to discharge        Co-evaluation               AM-PAC PT "6  Clicks" Mobility  Outcome Measure Help needed turning from your back to your side while in a flat bed without using bedrails?: None Help needed moving from lying on your back to sitting on the side of a flat bed without using bedrails?: None Help needed moving to and from a bed to a chair (including a wheelchair)?: None Help needed standing up from a chair using your arms (e.g., wheelchair or bedside chair)?: None Help needed to walk in hospital room?: None Help needed climbing 3-5 steps with a railing? : A Little 6 Click Score: 23    End of Session Equipment Utilized During Treatment: Gait belt Activity Tolerance: Patient limited by fatigue;Other (comment) (pt reported nausea and dizziness after walking, RN nofited) Patient left: in chair;with call bell/phone within reach;with family/visitor present Nurse Communication: Mobility status;Other (comment) (pt nauseous and dizzy) PT Visit Diagnosis: Difficulty in walking, not elsewhere classified (R26.2)    Time: 1051-1105 PT Time Calculation (min) (ACUTE ONLY): 14 min   Charges:   PT Evaluation $PT Eval Low Complexity: 1 Low          Ralene Bathe Kistler PT 04/21/2021  Acute Rehabilitation Services Pager [phone] Office [phone]

## 2021-04-21 NOTE — Discharge Summary (Signed)
Physician Discharge Summary  Sydney Gross RUE:[id number] DOB: 1930/10/30 DOA: 04/19/2021  PCP: Lupita RaiderShaw, Kimberlee, MD  Admit date: 04/19/2021 Discharge date: 04/21/2021  Admitted From: Home Disposition: Home  Recommendations for Outpatient Follow-up:  1. Follow ups as below. 2. Please obtain CBC/BMP/Mag at follow up 3. Please follow up on the following pending results: Gastric ulcer biopsy  Home Health: None required Equipment/Devices: None required  Discharge Condition: Stable CODE STATUS: Full code   Follow-up Information    Lupita RaiderShaw, Kimberlee, MD. Schedule an appointment as soon as possible for a visit in 1 week(s).   Specialty: Family Medicine Contact information: [address] [place] [address] [phone]        Lyn RecordsSmith, Henry W, MD. Schedule an appointment as soon as possible for a visit in 2 week(s).   Specialty: Cardiology Contact information: [address] [place] [address] [phone]                Hospital Course: 87 year old F with PMH of CAD s/p stent to LAD in 2019, COPD not on oxygen, PAD, HTN and HLD presenting with nausea, melena, lightheadedness and diaphoresis, and admitted for symptomatic anemia likely due to GI bleed.  Patient is on Plavix and also takes NSAIDs daily.  Patient was slightly hypotensive although H&H was at baseline.  Hemoccult was positive.  Coag labs negative.  CT abdomen and pelvis with diverticulosis but no diverticulitis.    However, patient had some leukocytosis and LLQ tenderness.  So she was started on IV Unasyn and discharged on p.o. Augmentin to complete a total of 7 days course for possible diverticulitis.  Patient had an EGD on 5/16 that revealed nonbleeding gastric ulcer and erythematous duodenal mucosa.  Gastric ulcer biopsy was pending.  H&H remained stable after initial drop.  GI cleared patient for discharge.  Plavix held on discharge until she discuss with cardiology.   Advised to avoid NSAID, and use Tylenol for pain.  See individual problem list below for more on hospital course. Discharge Diagnoses:  Symptomatic anemia due to acute blood loss from upper GI bleed-patient is on Plavix for CAD.  Also uses NSAIDs daily for osteoarthritis.  BUN elevated. Baseline Hgb 10-11.  Anemia panel with low normal ferritin.  -Discharged on p.o. Protonix 40 mg twice daily for 1 month followed by 40 mg daily -Advised to hold Plavix until she follows up with a cardiologist. -Advised to avoid NSAIDs, and use Tylenol instead -Unlikely he will follow-up with PCP and GI as above -Recheck CBC at follow-up  Possible acute diverticulitis: Patient has LLQ tenderness with mild leukocytosis raising concern for diverticulitis.  I have reviewed CT abdomen and pelvis with radiologist on-call and no radiologic evidence of diverticulitis but diverticulosis. -Started on Unasyn and discharged on p.o. Augmentin to complete a total of 7 days course  History of CAD s/p stent to LAD/HLD: No anginal symptoms. -Continue holding home Plavix until follow-up with a cardiologist -Resume home metoprolol and Zetia after EGD -She is on Repatha injection as well.  Chronic COPD: Stable. -Continue home breathing treatments  Essential hypertension: Normotensive. -Continue holding home medications pending EGD   Body mass index is 23.43 kg/m.            Discharge Exam: Vitals:   04/21/21 0806 04/21/21 1110  BP:  (!) 149/67  Pulse:    Resp:    Temp:    SpO2: 98% 98%    GENERAL: No apparent distress.  Nontoxic.  HEENT: MMM.  Vision and hearing grossly intact.  NECK: Supple.  No apparent JVD.  RESP: On RA.  No IWOB.  Fair aeration bilaterally. CVS:  RRR. Heart sounds normal.  ABD/GI/GU: Bowel sounds present. Soft. Non tender.  MSK/EXT:  Moves extremities. No apparent deformity. No edema.  SKIN: no apparent skin lesion or wound NEURO: Awake, alert and oriented appropriately.  No  apparent focal neuro deficit. PSYCH: Calm. Normal affect.   Discharge Instructions  Discharge Instructions    Call MD for:   Complete by: As directed    Persistent dark stool, frank red blood, severe dizziness, weakness or other symptoms concerning to you   Call MD for:  difficulty breathing, headache or visual disturbances   Complete by: As directed    Call MD for:  extreme fatigue   Complete by: As directed    Call MD for:  persistant dizziness or light-headedness   Complete by: As directed    Call MD for:  persistant nausea and vomiting   Complete by: As directed    Call MD for:  severe uncontrolled pain   Complete by: As directed    Call MD for:  temperature >100.4   Complete by: As directed    Diet - low sodium heart healthy   Complete by: As directed    Discharge instructions   Complete by: As directed    It has been a pleasure taking care of you!  You were hospitalized due to gastrointestinal bleed.  Your endoscopy showed nonbleeding stomach ulcer and some irritation of the upper part of your small intestine (duodenum).  The ulcer and regurgitation could be due to Aleve.  The ulcer could also be due to all infection.  Deep biopsy of an ulcer has been taken during the endoscopy.  The gastroenterologist will get in touch with you with the biopsy result if it is concerning.  Meanwhile, we are discharging you on Protonix to help with healing.   It is very important that you stop taking over-the-counter pain medication other than plain Tylenol.  We have also stopped your Plavix.  We recommend you discuss about your Plavix with your cardiology before resuming it.  You also have some tenderness over left lower part of his abdomen raising concern for possible diverticulitis (infection of your colon) in that area.  We started you on antibiotics, and we are discharging you on more of these antibiotics to complete treatment course.  Please follow-up with your primary care doctor to have  your levels rechecked.  Please review your new medication list and the directions on your medications before you take them.   Take care,   Increase activity slowly   Complete by: As directed      Allergies as of 04/21/2021      Reactions   Brilinta [ticagrelor] Shortness Of Breath   Codeine Nausea And Vomiting   Crestor [rosuvastatin] Other (See Comments)   MYALGIAS   Darvon [propoxyphene] Nausea Only   Lipitor [atorvastatin] Other (See Comments)   MYALGIAS   Zocor [simvastatin] Other (See Comments)   MYALGIAS   Nexlizet [bempedoic Acid-ezetimibe]    Muscle aches (tolerates Zetia just fine, allergy is with bempedoic acid component)      Medication List    STOP taking these medications   diphenhydramine-acetaminophen 25-500 MG Tabs tablet Commonly known as: TYLENOL PM   naproxen sodium 220 MG tablet Commonly known as: ALEVE   spironolactone 25 MG tablet Commonly known as: ALDACTONE  TAKE these medications   acetaminophen 325 MG tablet Commonly known as: Tylenol Take 2 tablets (650 mg total) by mouth every 6 (six) hours as needed for mild pain, moderate pain, fever or headache. What changed:   medication strength  how much to take  when to take this  reasons to take this   albuterol (2.5 MG/3ML) 0.083% nebulizer solution Commonly known as: PROVENTIL Take 3 mLs (2.5 mg total) by nebulization every 4 (four) hours as needed for wheezing or shortness of breath.   amoxicillin-clavulanate 875-125 MG tablet Commonly known as: Augmentin Take 1 tablet by mouth 2 (two) times daily for 6 days.   Breztri Aerosphere 160-9-4.8 MCG/ACT Aero Generic drug: Budeson-Glycopyrrol-Formoterol Inhale 2 puffs into the lungs in the morning and at bedtime.   cholecalciferol 1000 units tablet Commonly known as: VITAMIN D Take 1,000 Units by mouth at bedtime.   clopidogrel 75 MG tablet Commonly known as: PLAVIX Take 1 tablet (75 mg total) by mouth daily. Please discuss with  your cardiologist if you still need this medicine Start taking on: Apr 28, 2021 What changed:   additional instructions  These instructions start on Apr 28, 2021. If you are unsure what to do until then, ask your doctor or other care provider.   ezetimibe 10 MG tablet Commonly known as: ZETIA Take 1 tablet (10 mg total) by mouth daily. Please schedule yearly appointment for future refills. Thank you What changed: additional instructions   lisinopril-hydrochlorothiazide 20-25 MG tablet Commonly known as: ZESTORETIC Take 1 tablet by mouth at bedtime.   metoprolol succinate 25 MG 24 hr tablet Commonly known as: TOPROL-XL Take 1/2 (one-half) tablet by mouth once daily What changed: See the new instructions.   nitroGLYCERIN 0.4 MG SL tablet Commonly known as: NITROSTAT Place 0.4 mg under the tongue every 5 (five) minutes as needed for chest pain. What changed: Another medication with the same name was removed. Continue taking this medication, and follow the directions you see here.   pantoprazole 40 MG tablet Commonly known as: Protonix Take 1 tablet (40 mg total) by mouth 2 (two) times daily for 30 days, THEN 1 tablet (40 mg total) daily. Start taking on: Apr 21, 2021   PROLIA Calloway Inject 1 Dose as directed every 6 (six) months. Use as directed.   Repatha SureClick 140 MG/ML Soaj Generic drug: Evolocumab INJECT 1 PEN INTO THE SKIN EVERY 14 DAYS   SLOW-MAG PO Take 1 tablet by mouth daily.       Consultations:  Gastroenterology Procedures/Studies:  EGD  - Normal esophagus. - Non-bleeding gastric ulcer with a clean ulcer base (Forrest Class III). Biopsied. - Erythematous duodenopathy.   CT ABDOMEN PELVIS WO CONTRAST  Result Date: 04/19/2021 CLINICAL DATA:  Dry heaving and black stool. Denies GI bleed history EXAM: CT ABDOMEN AND PELVIS WITHOUT CONTRAST TECHNIQUE: Multidetector CT imaging of the abdomen and pelvis was performed following the standard protocol without  IV contrast. COMPARISON:  CT abdomen pelvis 08/24/2019 FINDINGS: Lower chest: No acute abnormality. Evaluation of the abdominal viscera limited by the lack of IV contrast. Hepatobiliary: No focal liver abnormality is seen. Status post cholecystectomy. Pancreas: Unremarkable. No surrounding inflammatory changes. Spleen: Normal in size without focal abnormality. Adrenals/Urinary Tract: Adrenal glands are unremarkable. Left kidney is atrophic. There is a simple cyst in the inferior pole the right kidney. 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 No hydronephrosis. No renal calculi. Urinary bladder is  unremarkable. Stomach/Bowel: Stomach is within normal limits. No evidence of bowel wall thickening, distention, or inflammatory changes. Multiple colonic diverticula without evidence of diverticulitis. 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 Aortic atherosclerosis. Vascular patency cannot be assessed in the absence of IV contrast. Reproductive: Status post hysterectomy. No adnexal masses. Other: No abdominal wall hernia or abnormality. No abdominopelvic ascites. Musculoskeletal: Multilevel degenerative disc disease and scoliosis of the lumbar spine. No acute abnormality identified. IMPRESSION: 1. No acute intra-abdominal pathology on a noncontrast exam. 2. Colonic diverticulosis without evidence of diverticulitis. 3. 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 4. Aortic atherosclerosis. Aortic Atherosclerosis (ICD10-I70.0). Electronically Signed   By: Emmaline Kluver M.D.   On: 04/19/2021 21:07        The results of significant diagnostics from this hospitalization (including imaging, microbiology, ancillary and laboratory) are listed below for reference.     Microbiology: Recent Results (from the past 240 hour(s))  Resp Panel by RT-PCR (Flu A&B, Covid) Nasopharyngeal Swab      Status: None   Collection Time: 04/19/21  8:03 PM   Specimen: Nasopharyngeal Swab; Nasopharyngeal(NP) swabs in vial transport medium  Result Value Ref Range Status   SARS Coronavirus 2 by RT PCR NEGATIVE NEGATIVE Final    Comment: (NOTE) SARS-CoV-2 target nucleic acids are NOT DETECTED.  The SARS-CoV-2 RNA is generally detectable in upper respiratory specimens during the acute phase of infection. The lowest concentration of SARS-CoV-2 viral copies this assay can detect is 138 copies/mL. A negative result does not preclude SARS-Cov-2 infection and should not be used as the sole basis for treatment or other patient management decisions. A negative result may occur with  improper specimen collection/handling, submission of specimen other than nasopharyngeal swab, presence of viral mutation(s) within the areas targeted by this assay, and inadequate number of viral copies(<138 copies/mL). A negative result must be combined with clinical observations, patient history, and epidemiological information. The expected result is Negative.  Fact Sheet for Patients:  BloggerCourse.com  Fact Sheet for Healthcare Providers:  SeriousBroker.it  This test is no t yet approved or cleared by the [place] FDA and  has been authorized for detection and/or diagnosis of SARS-CoV-2 by FDA under an Emergency Use Authorization (EUA). This EUA will remain  in effect (meaning this test can be used) for the duration of the COVID-19 declaration under Section 564(b)(1) of the Act, 21 U.S.C.section 360bbb-3(b)(1), unless the authorization is terminated  or revoked sooner.       Influenza A by PCR NEGATIVE NEGATIVE Final   Influenza B by PCR NEGATIVE NEGATIVE Final    Comment: (NOTE) The Xpert Xpress SARS-CoV-2/FLU/RSV plus assay is intended as an aid in the diagnosis of influenza from Nasopharyngeal swab specimens and should not be used as a sole basis  for treatment. Nasal washings and aspirates are unacceptable for Xpert Xpress SARS-CoV-2/FLU/RSV testing.  Fact Sheet for Patients: BloggerCourse.com  Fact Sheet for Healthcare Providers: SeriousBroker.it  This test is not yet approved or cleared by the [place] FDA and has been authorized for detection and/or diagnosis of SARS-CoV-2 by FDA under an Emergency Use Authorization (EUA). This EUA will remain in effect (meaning this test can be used) for the duration of the COVID-19 declaration under Section 564(b)(1) of the Act, 21 U.S.C. section 360bbb-3(b)(1), unless the authorization is terminated or revoked.  Performed at [facility], [address], [place], [place] [address]      Labs:  CBC: Recent Labs  Lab 04/19/21 2003 04/20/21 0131 04/20/21 0517 04/20/21 1456 04/20/21 2143 04/21/21 0542 04/21/21 0935  WBC 11.9*  --  10.6*  --   --  8.3  --   NEUTROABS 8.5*  --   --   --   --   --   --   HGB 10.2*   < > 8.9* 9.0* 8.2* 7.9*  8.0* 8.0*  HCT 31.4*   < > 28.9* 28.1* 26.3* 24.4*  25.2* 25.2*  MCV 102.3*  --  105.5*  --   --  103.0*  --   PLT 209  --  183  --   --  152  --    < > = values in this interval not displayed.   BMP &GFR Recent Labs  Lab 04/19/21 2003 04/20/21 0517 04/21/21 0542  NA 138 138 140  K 4.1 4.3 3.8  CL 109 110 110  CO2 22 21* 25  GLUCOSE 105* 106* 101*  BUN 56* 44* 19  CREATININE 0.96 0.91 0.75  CALCIUM 9.4 8.8* 8.8*  MG  --   --  1.7  PHOS  --   --  2.5   Estimated Creatinine Clearance: 36.1 mL/min (by C-G formula based on SCr of 0.75 mg/dL). Liver & Pancreas: Recent Labs  Lab 04/19/21 2003 04/20/21 0517 04/21/21 0542  AST 16 16  --   ALT 14 12  --   ALKPHOS 36* 31*  --   BILITOT 0.5 0.7  --   PROT 6.5 5.6*  --   ALBUMIN 4.2 3.4* 3.2*   Recent Labs  Lab 04/19/21 2003  LIPASE 33   No results for input(s): AMMONIA in the last 168  hours. Diabetic: No results for input(s): HGBA1C in the last 72 hours. No results for input(s): GLUCAP in the last 168 hours. Cardiac Enzymes: No results for input(s): CKTOTAL, CKMB, CKMBINDEX, TROPONINI in the last 168 hours. No results for input(s): PROBNP in the last 8760 hours. Coagulation Profile: Recent Labs  Lab 04/19/21 2003  INR 1.0   Thyroid Function Tests: No results for input(s): TSH, T4TOTAL, FREET4, T3FREE, THYROIDAB in the last 72 hours. Lipid Profile: No results for input(s): CHOL, HDL, LDLCALC, TRIG, CHOLHDL, LDLDIRECT in the last 72 hours. Anemia Panel: Recent Labs    04/20/21 0816  VITAMINB12 233  FOLATE 13.7  FERRITIN 28  TIBC 439  IRON 92  RETICCTPCT 2.1   Urine analysis:    Component Value Date/Time   COLORURINE ORANGE (A) 10/29/2015 1955   APPEARANCEUR CLOUDY (A) 10/29/2015 1955   LABSPEC 1.012 10/29/2015 1955   PHURINE 6.0 10/29/2015 1955   GLUCOSEU NEGATIVE 10/29/2015 1955   HGBUR NEGATIVE 10/29/2015 1955   BILIRUBINUR NEGATIVE 10/29/2015 1955   KETONESUR NEGATIVE 10/29/2015 1955   PROTEINUR 30 (A) 10/29/2015 1955   UROBILINOGEN 0.2 12/09/2013 1902   NITRITE NEGATIVE 10/29/2015 1955   LEUKOCYTESUR TRACE (A) 10/29/2015 1955   Sepsis Labs: Invalid input(s): PROCALCITONIN, LACTICIDVEN   Time coordinating discharge: 35 minutes  SIGNED:  Almon Hercules, MD  [facility] 04/21/2021, 11:07 PM  If 7PM-7AM, please contact night-coverage [email or web address]

## 2021-04-22 LAB — SURGICAL PATHOLOGY

## 2021-04-30 DIAGNOSIS — K5730 Diverticulosis of large intestine without perforation or abscess without bleeding: Secondary | ICD-10-CM | POA: Diagnosis not present

## 2021-04-30 DIAGNOSIS — K5900 Constipation, unspecified: Secondary | ICD-10-CM | POA: Diagnosis not present

## 2021-04-30 DIAGNOSIS — K259 Gastric ulcer, unspecified as acute or chronic, without hemorrhage or perforation: Secondary | ICD-10-CM | POA: Diagnosis not present

## 2021-05-01 ENCOUNTER — Other Ambulatory Visit: Payer: Self-pay | Admitting: Interventional Cardiology

## 2021-05-05 ENCOUNTER — Other Ambulatory Visit: Payer: Self-pay

## 2021-05-05 ENCOUNTER — Telehealth: Payer: Self-pay

## 2021-05-05 ENCOUNTER — Telehealth: Payer: Self-pay | Admitting: Interventional Cardiology

## 2021-05-05 DIAGNOSIS — I70213 Atherosclerosis of native arteries of extremities with intermittent claudication, bilateral legs: Secondary | ICD-10-CM

## 2021-05-05 NOTE — Telephone Encounter (Signed)
I spoke with patient's daughter. She reports patient has been taken off Aleve and Plavix after recent hospitalization.  Patient hospitalized for GI bleed.  Daughter is asking if patient should resume Plavix.  I told her based on discharge summary patient should not resume Plavix until she follows up with cardiology.  Patient has appointment on 6/6.  Daughter reports patient has seen GI but she is not sure if Plavix was discussed at this visit.  Daughter reports patient is having a lot of pain in her legs.  No discoloration of feet noted.  I asked her to follow up with [facility] for leg pain

## 2021-05-05 NOTE — Telephone Encounter (Signed)
New Message:     Pt's daughter called and said pt need s to be seen sooner than 05-11-21 if possible please. Pt legs are hurting and in so much pain, she can hardly walk.

## 2021-05-05 NOTE — Telephone Encounter (Signed)
Patient's daughter called to report patient is having bilateral short distance claudication that has been occurring for about two weeks. She has a small wound on the front of her ankle. Placed on schedule for the 9 month follow up with ABIs and PA visit.

## 2021-05-05 NOTE — Telephone Encounter (Signed)
I spoke with patient who gave permission to speak with her daughter.  I placed call to patient's daughter and left message to call office. Patient with known PAD.  Was followed in the past by Dr Kirke Corin but most recent appointment was with [facility].

## 2021-05-07 ENCOUNTER — Encounter: Payer: Self-pay | Admitting: Vascular Surgery

## 2021-05-07 ENCOUNTER — Ambulatory Visit: Payer: Medicare Other | Admitting: Vascular Surgery

## 2021-05-07 ENCOUNTER — Ambulatory Visit (HOSPITAL_COMMUNITY)
Admission: RE | Admit: 2021-05-07 | Discharge: 2021-05-07 | Disposition: A | Discharge status: Home / Self Care | Payer: Medicare Other | Source: Ambulatory Visit | Attending: Vascular Surgery | Admitting: Vascular Surgery

## 2021-05-07 ENCOUNTER — Other Ambulatory Visit: Payer: Self-pay

## 2021-05-07 VITALS — BP 138/79 | HR 88 | Temp 98.2°F | Resp 20 | Ht 59.0 in | Wt 114.4 lb

## 2021-05-07 DIAGNOSIS — I70213 Atherosclerosis of native arteries of extremities with intermittent claudication, bilateral legs: Secondary | ICD-10-CM | POA: Insufficient documentation

## 2021-05-07 DIAGNOSIS — I739 Peripheral vascular disease, unspecified: Secondary | ICD-10-CM

## 2021-05-07 NOTE — Progress Notes (Signed)
Patient name: Sydney Gross MRN: [id number] DOB: 13-Sep-1930 Sex: female  HPI: STEVEN VEAZIE is a 87 y.o. female, who we have been following since 2015 for leg pain.  She was originally seen by Dr. Imogene Burn in 2015.  At that time she had a duplex ultrasound which showed diffuse superficial femoral artery occlusive disease and ABIs of 0.7 on the right 0.78 on the left.  She has been followed intermittently since then every few years.  She is a non-smoker.  He does have a history of coronary artery disease.  She was last seen by our nurse practitioner in 2020.  She has taken cilostazol in the past but this caused diarrhea.ABIs in May 2021 0.6 on the right 0.4 on the left.   Today she complains of pain primarily in the left leg after walking about 10 yards she gets pain in the buttocks on the left side which extends all the way down into the left foot.  She has similar symptoms on the right leg but the left leg definitely is symptomatic first.  She also does has fairly significant COPD and becomes short of breath not too long after walking 10 yards.  She does not have rest pain.  She does not have tissue loss.  The pain in her leg recovers after a few minutes of rest and she can go again.  She did have a GI bleed about 3 weeks ago and was in the hospital for few days.  She did not require transfusion.  Upper endoscopy showed a nonbleeding gastric ulcer.  The patient states she had been taking fairly consistent Aleve which may have been related to this.  She has now stopped her Aleve.[name]  She was taken off all antiplatelet and nonsteroidals at that time.  She has not had further bleeding.    At her last office visit in 2020 she was having mild to moderate claudication symptoms and her ABIs were other medical problems include   Past Medical History:  Diagnosis Date  . Allergic rhinitis   . Arthritis    "maybe a little in my knees and back" (07/17/2018)  . Chronic lower back pain   . Colitis 12/2013    "never had this before"  . COPD (chronic obstructive pulmonary disease) (HCC)    "questionable/Dr. Katrinka Blazing" (07/17/2018)  . Coronary artery disease   . Exertional shortness of breath    "off and on" (07/17/2018)  . Family history of anesthesia complication    "mother; think they gave her too much; c/o pain after OR; gave her more RX; they had to bring her back real quick"  . H/O calcium pyrophosphate deposition disease (CPPD)   . Heart murmur   . Hiatal hernia   . Hyperlipidemia   . Hypertension   . Numbness in feet   . On home oxygen therapy    "I've used it 2 times in 2 yrs" (07/17/2018)  . PAD (peripheral artery disease) (HCC)   . Pneumonia    "1st grade & 4th grade" (07/17/2018)  . Proteinuria   . Shingles   . Stenosis of tear duct    Past Surgical History:  Procedure Laterality Date  . ABDOMINAL HYSTERECTOMY  1960's  . APPENDECTOMY    . BACK SURGERY    . BIOPSY  04/20/2021   Procedure: BIOPSY;  Surgeon: Jeani Hawking, MD;  Location: [facility];  Service: Endoscopy;;  . BLEPHAROPLASTY Bilateral 05/2017  . CARDIAC CATHETERIZATION    . CATARACT EXTRACTION, BILATERAL  Bilateral   . CESAREAN SECTION  1957; 1960  . CORONARY STENT INTERVENTION N/A 07/17/2018   Procedure: CORONARY STENT INTERVENTION;  Surgeon: Lyn Records, MD;  Location: [facility] INVASIVE CV LAB;  Service: Cardiovascular;  Laterality: N/A;  . DACROCYSTORHINOSTOMY W/ JONES TUBE Bilateral 05/2017   "put tear duct in my eyes"  . ESOPHAGOGASTRODUODENOSCOPY (EGD) WITH PROPOFOL N/A 04/20/2021   Procedure: ESOPHAGOGASTRODUODENOSCOPY (EGD) WITH PROPOFOL;  Surgeon: Jeani Hawking, MD;  Location: [facility];  Service: Endoscopy;  Laterality: N/A;  . EYE SURGERY    . LAPAROSCOPIC CHOLECYSTECTOMY  ~ 2009  . LUMBAR DISC SURGERY  ?2004  . RIGHT/LEFT HEART CATH AND CORONARY ANGIOGRAPHY N/A 07/17/2018   Procedure: RIGHT/LEFT HEART CATH AND CORONARY ANGIOGRAPHY;  Surgeon: Lyn Records, MD;  Location: [facility] INVASIVE CV LAB;  Service:  Cardiovascular;  Laterality: N/A;  . TONSILLECTOMY AND ADENOIDECTOMY  ?1940's    Family History  Problem Relation Age of Onset  . Emphysema Father 41  . CVA Mother   . Cancer Brother        ? type    SOCIAL HISTORY: Social History   Socioeconomic History  . Marital status: Married    Spouse name: Delton See  . Number of children: Not on file  . Years of education: 70  . Highest education level: High school graduate  Occupational History  . Occupation: Retired  Tobacco Use  . Smoking status: Never Smoker  . Smokeless tobacco: Never Used  Vaping Use  . Vaping Use: Never used  Substance and Sexual Activity  . Alcohol use: Yes    Alcohol/week: 4.0 standard drinks    Types: 4 Glasses of wine per week    Comment: glass of wine every other night  . Drug use: Never  . Sexual activity: Not Currently  Other Topics Concern  . Not on file  Social History Narrative  . Not on file   Social Determinants of Health   Financial Resource Strain: Not on file  Food Insecurity: Not on file  Transportation Needs: Not on file  Physical Activity: Not on file  Stress: Not on file  Social Connections: Not on file  Intimate Partner Violence: Not on file    Allergies  Allergen Reactions  . Brilinta [Ticagrelor] Shortness Of Breath  . Codeine Nausea And Vomiting  . Darvon [Propoxyphene] Nausea Only  . Lipitor [Atorvastatin] Other (See Comments)    MYALGIAS  . Rosuvastatin Other (See Comments)    MYALGIAS Other reaction(s): aches  . Simvastatin Other (See Comments)    MYALGIAS Other reaction(s): aches  . Ezetimibe     Other reaction(s): aches  . Nexlizet [Bempedoic Acid-Ezetimibe]     Muscle aches (tolerates Zetia just fine, allergy is with bempedoic acid component)  . Tramadol Hcl     Other reaction(s): nausea (08/2019)    Current Outpatient Medications  Medication Sig Dispense Refill  . acetaminophen (TYLENOL) 325 MG tablet Take 2 tablets (650 mg total) by mouth every 6 (six)  hours as needed for mild pain, moderate pain, fever or headache. 240 tablet 0  . albuterol (PROVENTIL) (2.5 MG/3ML) 0.083% nebulizer solution Take 3 mLs (2.5 mg total) by nebulization every 4 (four) hours as needed for wheezing or shortness of breath. 75 mL 0  . Budeson-Glycopyrrol-Formoterol (BREZTRI AEROSPHERE) 160-9-4.8 MCG/ACT AERO Inhale 2 puffs into the lungs in the morning and at bedtime.    . cholecalciferol (VITAMIN D) 1000 units tablet Take 1,000 Units by mouth at bedtime.    . clopidogrel (  PLAVIX) 75 MG tablet Take 1 tablet (75 mg total) by mouth daily. Please discuss with your cardiologist if you still need this medicine 90 tablet 2  . Denosumab (PROLIA [place]) Inject 1 Dose as directed every 6 (six) months. Use as directed.    . Evolocumab (REPATHA SURECLICK) 140 MG/ML SOAJ INJECT 1 PEN INTO THE SKIN EVERY 14 DAYS 2 mL 11  . ezetimibe (ZETIA) 10 MG tablet Take 1 tablet by mouth once daily 90 tablet 0  . lisinopril-hydrochlorothiazide (PRINZIDE,ZESTORETIC) 20-25 MG tablet Take 1 tablet by mouth at bedtime.     . Magnesium Cl-Calcium Carbonate (SLOW-MAG PO) Take 1 tablet by mouth daily.    . metoprolol succinate (TOPROL-XL) 25 MG 24 hr tablet Take 1/2 (one-half) tablet by mouth once daily 45 tablet 0  . nitroGLYCERIN (NITROSTAT) 0.4 MG SL tablet Place 0.4 mg under the tongue every 5 (five) minutes as needed for chest pain.    . pantoprazole (PROTONIX) 40 MG tablet Take 1 tablet (40 mg total) by mouth 2 (two) times daily for 30 days, THEN 1 tablet (40 mg total) daily. 120 tablet 0   No current facility-administered medications for this visit.    ROS:   General:  No weight loss, Fever, chills  HEENT: No recent headaches, no nasal bleeding, no visual changes, no sore throat  Neurologic: No dizziness, blackouts, seizures. No recent symptoms of stroke or mini- stroke. No recent episodes of slurred speech, or temporary blindness.  Cardiac: No recent episodes of chest pain/pressure, no  shortness of breath at rest.  + shortness of breath with exertion.  Denies history of atrial fibrillation or irregular heartbeat  Vascular: No history of rest pain in feet.  + history of claudication.  No history of non-healing ulcer, No history of DVT   Pulmonary: No home oxygen, no productive cough, no hemoptysis,  No asthma or wheezing  Musculoskeletal:  [X]  Arthritis, [ ]  Low back pain,  [ ]  Joint pain  Hematologic:No history of hypercoagulable state.  No history of easy bleeding.  No history of anemia  Gastrointestinal: + hematochezia or melena,  No gastroesophageal reflux, no trouble swallowing  Urinary: [ ]  chronic Kidney disease, [ ]  on HD - [ ]  MWF or [ ]  TTHS, [ ]  Burning with urination, [ ]  Frequent urination, [ ]  Difficulty urinating;   Skin: No rashes  Psychological: No history of anxiety,  No history of depression   Physical Examination  Vitals:   05/07/21 0835  BP: 138/79  Pulse: 88  Resp: 20  Temp: 98.2 F (36.8 C)  SpO2: 95%  Weight: 114 lb 6.4 oz (51.9 kg)  Height: 4\' 11"  (1.499 m)    Body mass index is 23.11 kg/m.  General:  Alert and oriented, no acute distress HEENT: Normal Neck: No JVD Cardiac: Regular Rate and Rhythm Abdomen: Soft, non-tender, non-distended, no mass Skin: No rash Extremity Pulses:  2+ radial, brachial, 1+ right absent left femoral, absent popliteal dorsalis pedis, posterior tibial pulses bilaterally Musculoskeletal: No deformity or edema  Neurologic: Upper and lower extremity motor 5/5 and symmetric  DATA:  Patient had bilateral ABIs performed today which were 0.8 on the right 0.4 on the left these were fairly consistent with her ABIs from May 2021 and October 2020.  ASSESSMENT: Lifestyle limiting left leg claudication.  She probably has a component of osteoarthritis as well but certainly her ABIs have declined over the last few years and her symptoms are consistent with claudication.  Her physical exam  suggest she may have  aortoiliac as well as superficial femoral occlusive disease.  She currently does not have rest pain or tissue loss.  She recently did have a GI bleed and has been taken off all nonsteroidals and antiplatelet agents.   PLAN: The patient will follow up with me in 1 month to consider arteriogram lower extremity runoff possible intervention.  If we consider intervention she would definitely need antiplatelet therapy for at least 4 to 6 weeks post procedure.  We discussed the possibility of a percutaneous intervention versus the possibility that she may would possibly require an open operation.  Certainly with her age and overall comorbidities if she requires open operation this will require further discussion.  We will contact Dr. Loreta Ave from GI to assess risk of placing patient on Plavix or aspirin post procedure.  I will also send a copy of my note today to Dr. Garnette Scheuermann with cardiology so that he can weigh in an opinion on antiplatelet therapy as well.  At her next appointment we will perform aortoiliac duplex and bilateral lower extremity arterial duplex so that we have a roadmap for what might be involved in her intervention.   Fabienne Bruns, MD [facility] Office: [phone]

## 2021-05-11 ENCOUNTER — Encounter: Payer: Self-pay | Admitting: Family

## 2021-05-11 ENCOUNTER — Ambulatory Visit: Payer: Medicare Other | Admitting: Family

## 2021-05-11 ENCOUNTER — Other Ambulatory Visit: Payer: Self-pay | Admitting: Interventional Cardiology

## 2021-05-11 ENCOUNTER — Other Ambulatory Visit: Payer: Self-pay

## 2021-05-11 VITALS — BP 110/70 | HR 73 | Ht <= 58 in | Wt 113.4 lb

## 2021-05-11 DIAGNOSIS — I25118 Atherosclerotic heart disease of native coronary artery with other forms of angina pectoris: Secondary | ICD-10-CM

## 2021-05-11 DIAGNOSIS — I2510 Atherosclerotic heart disease of native coronary artery without angina pectoris: Secondary | ICD-10-CM | POA: Diagnosis not present

## 2021-05-11 DIAGNOSIS — I10 Essential (primary) hypertension: Secondary | ICD-10-CM

## 2021-05-11 DIAGNOSIS — E785 Hyperlipidemia, unspecified: Secondary | ICD-10-CM | POA: Diagnosis not present

## 2021-05-11 DIAGNOSIS — D500 Iron deficiency anemia secondary to blood loss (chronic): Secondary | ICD-10-CM

## 2021-05-11 MED ORDER — EZETIMIBE 10 MG PO TABS: 10.0000 mg | ORAL_TABLET | Freq: Every day | ORAL | 5 refills | Status: DC

## 2021-05-11 NOTE — Progress Notes (Signed)
Office Visit    Patient Name: Sydney Gross Date of Encounter: 05/11/2021  PCP:  Mayra Neer, MD   [place]  Cardiologist:  Sinclair Grooms, MD  Advanced Practice Provider:  No care team member to display Electrophysiologist:  None   Chief Complaint    Sydney Gross is a 87 y.o. female with a hx of CAD s/p DES to LAD and diagonal 2019, COPD, PAD, HTN, HLD, upper GI bleed presents today for hospital follow up.  Past Medical History    Past Medical History:  Diagnosis Date  . Allergic rhinitis   . Arthritis    "maybe a little in my knees and back" (07/17/2018)  . Chronic lower back pain   . Colitis 12/2013   "never had this before"  . COPD (chronic obstructive pulmonary disease) ([place])    "questionable/Dr. Tamala Julian" (07/17/2018)  . Coronary artery disease   . Exertional shortness of breath    "off and on" (07/17/2018)  . Family history of anesthesia complication    "mother; think they gave her too much; c/o pain after OR; gave her more RX; they had to bring her back real quick"  . H/O calcium pyrophosphate deposition disease (CPPD)   . Heart murmur   . Hiatal hernia   . Hyperlipidemia   . Hypertension   . Numbness in feet   . On home oxygen therapy    "I've used it 2 times in 2 yrs" (07/17/2018)  . PAD (peripheral artery disease) ([place])   . Pneumonia    "1st grade & 4th grade" (07/17/2018)  . Proteinuria   . Shingles   . Stenosis of tear duct    Past Surgical History:  Procedure Laterality Date  . ABDOMINAL HYSTERECTOMY  1960's  . APPENDECTOMY    . BACK SURGERY    . BIOPSY  04/20/2021   Procedure: BIOPSY;  Surgeon: Carol Ada, MD;  Location: [facility];  Service: Endoscopy;;  . BLEPHAROPLASTY Bilateral 05/2017  . CARDIAC CATHETERIZATION    . CATARACT EXTRACTION, BILATERAL Bilateral   . CESAREAN SECTION  1957; 1960  . CORONARY STENT INTERVENTION N/A 07/17/2018   Procedure: CORONARY STENT INTERVENTION;  Surgeon: Belva Crome, MD;  Location: [place] CV LAB;  Service: Cardiovascular;  Laterality: N/A;  . DACROCYSTORHINOSTOMY W/ JONES TUBE Bilateral 05/2017   "put tear duct in my eyes"  . ESOPHAGOGASTRODUODENOSCOPY (EGD) WITH PROPOFOL N/A 04/20/2021   Procedure: ESOPHAGOGASTRODUODENOSCOPY (EGD) WITH PROPOFOL;  Surgeon: Carol Ada, MD;  Location: [facility];  Service: Endoscopy;  Laterality: N/A;  . EYE SURGERY    . LAPAROSCOPIC CHOLECYSTECTOMY  ~ 2009  . LUMBAR DISC SURGERY  ?2004  . RIGHT/LEFT HEART CATH AND CORONARY ANGIOGRAPHY N/A 07/17/2018   Procedure: RIGHT/LEFT HEART CATH AND CORONARY ANGIOGRAPHY;  Surgeon: Belva Crome, MD;  Location: [place] CV LAB;  Service: Cardiovascular;  Laterality: N/A;  . TONSILLECTOMY AND ADENOIDECTOMY  ?1940's    Allergies  Allergies  Allergen Reactions  . Brilinta [Ticagrelor] Shortness Of Breath  . Codeine Nausea And Vomiting  . Darvon [Propoxyphene] Nausea Only  . Lipitor [Atorvastatin] Other (See Comments)    MYALGIAS  . Rosuvastatin Other (See Comments)    MYALGIAS Other reaction(s): aches  . Simvastatin Other (See Comments)    MYALGIAS Other reaction(s): aches  . Ezetimibe     Other reaction(s): aches  . Nexlizet [Bempedoic Acid-Ezetimibe]     Muscle aches (tolerates Zetia just fine, allergy is with bempedoic  acid component)  . Tramadol Hcl     Other reaction(s): nausea (08/2019)    History of Present Illness    Sydney Gross is a 87 y.o. female with a hx of CAD s/p DES to LAD and diagonal 2019, COPD, PAD, HTN, HLD, upper GI bleed last seen 03/2020 by Dr. Tamala Julian.  She had previous cardiac cath 07/2018 with noted high grade obstruction in the mid LAD and prox to mid first diagonal with PCi and DES to mid LAD and prox to mid first diagonal. Residual disease of dominant RCA up to40% distally. She was recommended for aggressive risk factor modification an DAPT for at least 6 months.   She was admitted 04/19/21 with symptomatic anemia due to acute  blood loss from upper GI bleed. Endoscopy showed nonbleeding stomach ulcer and irritation of the duodenmHer Plavix and NSAIDS were discontinued. She was discharged on Protonix 33m BID x1 month then 429mQD. Due to possible acute diverticulitis and treated with antibiotics.   She saw Dr. FiOneida Alar/2/22 with ABI showing lifestyle limiting left leg claudication with ABIs having declined. Recommended to follow up in 1 month for aortiliac duplex and bilateral lower extremity arterial duplex with consideration of arteriogram lower extremity runoff possible intervention. Noted that she would require antiplatelet for 4-6 weeks post procedure if performed.   She presents today for follow up with her daughter. She lives independently with her husband. Tells me she has been feeling "great" since hospitalization and today she was able to walk "a good ways". Does note it made her fatigued and she may have over-done it. We discussed a slow return to normal activities particularly in the setting of anemia. Reports no bleeding, saw GI since hospital discharge with no recurrent testing planned at this time. Endorses claudication with ambulation. No chest pain, pressure, tightness. Tells me her dyspnea on exertion is improving. No lightheadedness, near syncope, syncope. No edema, orthopnea, PND.  EKGs/Labs/Other Studies Reviewed:   The following studies were reviewed today:  R/[facility]/2019  Coronary artery disease with high-grade obstruction in the mid LAD and proximal to mid first diagonal.  Normal left main.  Normal circumflex coronary artery.  Dominant RCA with luminal irregularities up to 40% distally.  Right heart pressures  Ventricular systolic function and LVEDP (14 mmHg).  Successful PCI and stent implantation in both the mid LAD and proximal to mid first diagonal.    The LAD 90% stenosis was reduced to 0% with TIMI grade III flow using a 2.5 x 28 Synergy postdilated to 2.75 mm in diameter.  TIMI grade  III flow pre-and post stenting.  The first diagonal 90% stenosis was reduced to 0% using by 2.25 mm Synergy deployed at 14 atm which is not effective stent size of 2.32 mm.  TIMI grade III flow noted pre-and post PCI.   RECOMMENDATIONS:    Continue aggressive risk factor modification: PCSK9 therapy (Repatha), moderate aerobic physical activity, blood pressure control less than 140/90 mmHg, and surveillance of glycemic control.  Aspirin and Brilinta x1 month.  Switch to aspirin and Plavix thereafter.  Total duration of dual antiplatelet therapy 6 months at which time Plavix can be dropped.  Discharge in a.m.   Recommend uninterrupted dual antiplatelet therapy with Aspirin 8174maily and Ticagrelor 66m39mice daily for a minimum of 6 months (stable ischemic heart disease - Class I recommendation).   EKG:  No EKG today  Recent Labs: 04/20/2021: ALT 12 04/21/2021: BUN 19; Creatinine, Ser 0.75; Hemoglobin 8.0; Magnesium  1.7; Platelets 152; Potassium 3.8; Sodium 140  Recent Lipid Panel    Component Value Date/Time   CHOL 186 03/05/2020 0920   TRIG 188 (H) 03/05/2020 0920   HDL 43 03/05/2020 0920   CHOLHDL 4.3 03/05/2020 0920   LDLCALC 110 (H) 03/05/2020 0920   Home Medications   Current Meds  Medication Sig  . acetaminophen (TYLENOL) 325 MG tablet Take 2 tablets (650 mg total) by mouth every 6 (six) hours as needed for mild pain, moderate pain, fever or headache.  . albuterol (PROVENTIL) (2.5 MG/3ML) 0.083% nebulizer solution Take 3 mLs (2.5 mg total) by nebulization every 4 (four) hours as needed for wheezing or shortness of breath.  . Budeson-Glycopyrrol-Formoterol (BREZTRI AEROSPHERE) 160-9-4.8 MCG/ACT AERO Inhale 2 puffs into the lungs in the morning and at bedtime.  . cholecalciferol (VITAMIN D) 1000 units tablet Take 1,000 Units by mouth at bedtime.  . Denosumab (PROLIA [place]) Inject 1 Dose as directed every 6 (six) months. Use as directed.  . Evolocumab (REPATHA SURECLICK) 283  MG/ML SOAJ INJECT 1 PEN INTO THE SKIN EVERY 14 DAYS  . lisinopril-hydrochlorothiazide (PRINZIDE,ZESTORETIC) 20-25 MG tablet Take 1 tablet by mouth at bedtime.   . Magnesium Cl-Calcium Carbonate (SLOW-MAG PO) Take 1 tablet by mouth daily.  . metoprolol succinate (TOPROL-XL) 25 MG 24 hr tablet Take 1/2 (one-half) tablet by mouth once daily  . nitroGLYCERIN (NITROSTAT) 0.4 MG SL tablet Place 0.4 mg under the tongue every 5 (five) minutes as needed for chest pain.  . pantoprazole (PROTONIX) 40 MG tablet Take 1 tablet (40 mg total) by mouth 2 (two) times daily for 30 days, THEN 1 tablet (40 mg total) daily.  . [DISCONTINUED] clopidogrel (PLAVIX) 75 MG tablet Take 1 tablet (75 mg total) by mouth daily. Please discuss with your cardiologist if you still need this medicine  . [DISCONTINUED] ezetimibe (ZETIA) 10 MG tablet Take 1 tablet by mouth once daily     Review of Systems  All other systems reviewed and are otherwise negative except as noted above.  Physical Exam    VS:  BP 110/70   Pulse 73   Ht _0  (1.448 m)   Wt 113 lb 6.4 oz (51.4 kg)   SpO2 98%   BMI 24.54 kg/m  , BMI Body mass index is 24.54 kg/m.  Wt Readings from Last 3 Encounters:  05/11/21 113 lb 6.4 oz (51.4 kg)  05/07/21 114 lb 6.4 oz (51.9 kg)  04/20/21 116 lb (52.6 kg)    GEN: Well nourished, thin, well developed, in no acute distress. HEENT: normal. Neck: Supple, no JVD, carotid bruits, or masses. Cardiac: RRR, no murmurs, rubs, or gallops. No clubbing, cyanosis, edema.  Radials 2+ and equal bilaterally.  Respiratory:  Respirations regular and unlabored, clear to auscultation bilaterally. GI: Soft, nontender, nondistended. MS: No deformity or atrophy. Skin: Warm and dry, no rash. Neuro:  Strength and sensation are intact. Psych: Normal affect.  Assessment & Plan    1. UGI bleed / Anemia - Continue to follow with GI. Continue Protonix as directed. Recent admission with UGI bleed with endoscopy not requiring PRBC.  Discontinue Plavix. Consider Aspirin EC 46m daily for secondary prevention of coronary artery disease and previous stenting, but check CBC to ensure stability of Hb. Educated on dietary sources of anemia.  2. CAD s/p DES LAD and diagonal - Stable with no anginal symptoms. GDMT includes Repatha, Metoprolol. Resume Zetia, as below. No statin due to intolerance. Previously on Plavix but with recent GI  bleed. As her last coronary intervention was 2019 appropriate to discontinue Plavix. Ideally, would place on Aspirin EC 69m daily but will recheck CBC, CMP today prior to initiating.   3. PAD - Follows with Dr. FOneida Alarof [facility]. Plan for aortoiliac duplex in anticipate of likely upcoming aortogram. Per Dr. FOneida Alar will need to ensure she can tolerate antiplatelet prior to performing. She is deemed acceptable risk for aortogram at this time as she is without anginal symptoms and no indication for ischemic evaluation at this time.  4. HLD, LDL goal <70 / Statin myopathy - Continue Repatha and Zetia. Notes she is not taking Zetia, unclear why, most recent LDL 128 while on Repatha. Will resume Zetia 164mdaily. Does not recall previous intolerance. Anticipate listed intolerance of 'ache' is from while she was concurrently taking statin. Provided 30 day supply and educated to discontinue if she notes myalgias.  5. Dilated aortic root - Continue optimal BP and volume control.   6. HTN - BP well controlled. Continue current antihypertensive regimen.   Disposition: Follow up in 6 month(s) with Dr. SmTamala Julianr APP   Signed, CaLoel DubonnetNP 05/11/2021, 9:13 PM [place]

## 2021-05-11 NOTE — Patient Instructions (Addendum)
Medication Instructions:  Your physician has recommended you make the following change in your medication:   RESUME Zetia 10mg  daily  STAY OFF Clopidogrel (Plavix)   *If you need a refill on your cardiac medications before your next appointment, please call your pharmacy*  Lab Work: Your provider recommends lab work today: CMP, CBC  This will let recheck your kidneys, liver, and blood counts after your recent hospital admission.   If you have labs (blood work) drawn today and your tests are completely normal, you will receive your results only by: [name] MyChart Message (if you have MyChart) OR . A paper copy in the mail If you have any lab test that is abnormal or we need to change your treatment, we will call you to review the results.  Testing/Procedures: None ordered today.    Follow-Up: At [facility], you and your health needs are our priority.  As part of our continuing mission to provide you with exceptional heart care, we have created designated Provider Care Teams.  These Care Teams include your primary Cardiologist (physician) and Advanced Practice Providers (APPs -  Physician Assistants and Nurse Practitioners) who all work together to provide you with the care you need, when you need it.  We recommend signing up for the patient portal called "MyChart".  Sign up information is provided on this After Visit Summary.  MyChart is used to connect with patients for Virtual Visits (Telemedicine).  Patients are able to view lab/test results, encounter notes, upcoming appointments, etc.  Non-urgent messages can be sent to your provider as well.   To learn more about what you can do with MyChart, go to [facility].    Your next appointment:   6 month(s)  On Friday, December 9 @ 1:20 pm.   The format for your next appointment:   In Person  Provider:   You may see [id number], MD or one of the following Advanced Practice Providers on your designated Care Team:     Other Instructions  Heart Healthy Diet Recommendations: A low-salt diet is recommended. Meats should be grilled, baked, or boiled. Avoid fried foods. Focus on lean protein sources like fish or chicken with vegetables and fruits. The American Heart Association is a Lesleigh Noe!  American Heart Association Diet and Lifeystyle Recommendations   Exercise recommendations: The American Heart Association recommends 150 minutes of moderate intensity exercise weekly. Try 30 minutes of moderate intensity exercise 4-5 times per week. This could include walking, jogging, or swimming.   Iron-Rich Diet  Iron is a mineral that helps your body to produce hemoglobin. Hemoglobin is a protein in red blood cells that carries oxygen to your body's tissues. Eating too little iron may cause you to feel weak and tired, and it can increase your risk of infection. Iron is naturally found in many foods, and many foods have iron added to them (iron-fortified foods). You may need to follow an iron-rich diet if you do not have enough iron in your body due to certain medical conditions. The amount of iron that you need each day depends on your age, your sex, and any medical conditions you have. Follow instructions from your health care provider or a diet and nutrition specialist (dietitian) about how much iron you should eat each day. What are tips for following this plan? Reading food labels  Check food labels to see how many milligrams (mg) of iron are in each serving. Cooking  Cook foods in pots and pans that  are made from iron.  Take these steps to make it easier for your body to absorb iron from certain foods: ? Soak beans overnight before cooking. ? Soak whole grains overnight and drain them before using. ? Ferment flours before baking, such as by using yeast in bread dough. Meal planning  When you eat foods that contain iron, you should eat them with foods that are high in vitamin C. These include  oranges, peppers, tomatoes, potatoes, and mango. Vitamin C helps your body to absorb iron. General information  Take iron supplements only as told by your health care provider. An overdose of iron can be life-threatening. If you were prescribed iron supplements, take them with orange juice or a vitamin C supplement.  When you eat iron-fortified foods or take an iron supplement, you should also eat foods that naturally contain iron, such as meat, poultry, and fish. Eating naturally iron-rich foods helps your body to absorb the iron that is added to other foods or contained in a supplement.  Certain foods and drinks prevent your body from absorbing iron properly. Avoid eating these foods in the same meal as iron-rich foods or with iron supplements. These foods include: ? Coffee, black tea, and red wine. ? Milk, dairy products, and foods that are high in calcium. ? Beans and soybeans. ? Whole grains. What foods should I eat? Fruits Prunes. Raisins. Eat fruits high in vitamin C, such as oranges, grapefruits, and strawberries, alongside iron-rich foods. Vegetables Spinach (cooked). Green peas. Broccoli. Fermented vegetables. Eat vegetables high in vitamin C, such as leafy greens, potatoes, bell peppers, and tomatoes, alongside iron-rich foods. Grains Iron-fortified breakfast cereal. Iron-fortified whole-wheat bread. Enriched rice. Sprouted grains. Meats and other proteins Beef liver. Oysters. Beef. Shrimp. [place]. Chicken. Tuna. Sardines. Chickpeas. Nuts. Tofu. Pumpkin seeds. Beverages Tomato juice. Fresh orange juice. Prune juice. Hibiscus tea. Fortified instant breakfast shakes. Sweets and desserts Blackstrap molasses. Seasonings and condiments Tahini. Fermented soy sauce. Other foods Wheat germ. The items listed above may not be a complete list of recommended foods and beverages. Contact a dietitian for more information. What foods should I avoid? Grains Whole grains. Bran cereal.  Bran flour. Oats. Meats and other proteins Soybeans. Products made from soy protein. Black beans. Lentils. Mung beans. Split peas. Dairy Milk. Cream. Cheese. Yogurt. Cottage cheese. Beverages Coffee. Black tea. Red wine. Sweets and desserts Cocoa. Chocolate. Ice cream. Other foods Basil. Oregano. Large amounts of parsley. The items listed above may not be a complete list of foods and beverages to avoid. Contact a dietitian for more information. Summary  Iron is a mineral that helps your body to produce hemoglobin. Hemoglobin is a protein in red blood cells that carries oxygen to your body's tissues.  Iron is naturally found in many foods, and many foods have iron added to them (iron-fortified foods).  When you eat foods that contain iron, you should eat them with foods that are high in vitamin C. Vitamin C helps your body to absorb iron.  Certain foods and drinks prevent your body from absorbing iron properly, such as whole grains and dairy products. You should avoid eating these foods in the same meal as iron-rich foods or with iron supplements. This information is not intended to replace advice given to you by your health care provider. Make sure you discuss any questions you have with your health care provider. Document Revised: 11/04/2017 Document Reviewed: 10/18/2017 Elsevier Patient Education  2021 [employer].

## 2021-05-12 ENCOUNTER — Telehealth: Payer: Self-pay

## 2021-05-12 ENCOUNTER — Other Ambulatory Visit: Payer: Self-pay

## 2021-05-12 DIAGNOSIS — I739 Peripheral vascular disease, unspecified: Secondary | ICD-10-CM

## 2021-05-12 DIAGNOSIS — I25118 Atherosclerotic heart disease of native coronary artery with other forms of angina pectoris: Secondary | ICD-10-CM

## 2021-05-12 DIAGNOSIS — Z79899 Other long term (current) drug therapy: Secondary | ICD-10-CM

## 2021-05-12 LAB — COMPREHENSIVE METABOLIC PANEL
ALT: 12 IU/L (ref 0–32)
AST: 18 IU/L (ref 0–40)
Albumin/Globulin Ratio: 2.8 — ABNORMAL HIGH (ref 1.2–2.2)
Albumin: 4.8 g/dL — ABNORMAL HIGH (ref 3.6–4.6)
Alkaline Phosphatase: 55 IU/L (ref 44–121)
BUN/Creatinine Ratio: 16 (ref 12–28)
BUN: 25 mg/dL (ref 8–27)
Bilirubin Total: <0.2 mg/dL (ref 0.0–1.2)
CO2: 20 mmol/L (ref 20–29)
Calcium: 10.1 mg/dL (ref 8.7–10.3)
Chloride: 110 mmol/L — ABNORMAL HIGH (ref 96–106)
Creatinine, Ser: 1.53 mg/dL — ABNORMAL HIGH (ref 0.57–1.00)
Globulin, Total: 1.7 g/dL (ref 1.5–4.5)
Glucose: 105 mg/dL — ABNORMAL HIGH (ref 65–99)
Potassium: 5.1 mmol/L (ref 3.5–5.2)
Sodium: 145 mmol/L — ABNORMAL HIGH (ref 134–144)
Total Protein: 6.5 g/dL (ref 6.0–8.5)
eGFR: 33 mL/min/{1.73_m2} — ABNORMAL LOW (ref >59)

## 2021-05-12 LAB — CBC
Hematocrit: 31.8 % — ABNORMAL LOW (ref 34.0–46.6)
Hemoglobin: 10.1 g/dL — ABNORMAL LOW (ref 11.1–15.9)
MCH: 30.5 pg (ref 26.6–33.0)
MCHC: 31.8 g/dL (ref 31.5–35.7)
MCV: 96 fL (ref 79–97)
Platelets: 331 10*3/uL (ref 150–450)
RBC: 3.31 x10E6/uL — ABNORMAL LOW (ref 3.77–5.28)
RDW: 12.9 % (ref 11.7–15.4)
WBC: 9.9 10*3/uL (ref 3.4–10.8)

## 2021-05-12 MED ORDER — LISINOPRIL-HYDROCHLOROTHIAZIDE 20-25 MG PO TABS: 0.5000 | ORAL_TABLET | Freq: Every day | ORAL | 3 refills | Status: DC

## 2021-05-12 MED ORDER — ASPIRIN EC 81 MG PO TBEC: 81.0000 mg | DELAYED_RELEASE_TABLET | Freq: Every day | ORAL | 3 refills | Status: DC

## 2021-05-12 NOTE — Telephone Encounter (Signed)
Pharmacy will open today at 2:00 pm.

## 2021-05-12 NOTE — Telephone Encounter (Signed)
Pt verbalized understanding of her lab results.  

## 2021-05-12 NOTE — Telephone Encounter (Signed)
-----   Message from Alver Sorrow, NP sent at 05/12/2021  7:59 AM EDT ----- CBC shows blood counts continue to improve since hospital discharge. Anemia improving! Given improvement and history of coronary stent, start Aspirin EC 8mg  daily.   Renal function indicative of dehydration. Recommend increased fluid intake. Recommend reduce Lisinopril-HCTZ to half tablet daily.   Repeat BMP in 1-2 weeks for monitoring. As we reduced Lisinopril-HCTZ please ask her to check BP daily and contact our office if BP >130/80. If no BP cuff at home, would move follow up appt to 3 months

## 2021-05-13 NOTE — Telephone Encounter (Signed)
Patient's daughter, Dennard Nip, is following up, requesting to go over the patient's lab results and medication instructions. She states yesterday when the patient took down the information she was anxious and she assumes she didn't get everything correctly.

## 2021-05-13 NOTE — Telephone Encounter (Signed)
RN returned call to [facility], patients daughter to review lab results and medication changes. Daughter verbalized understanding and in agreement with plan.

## 2021-05-18 ENCOUNTER — Other Ambulatory Visit: Payer: Self-pay | Admitting: Interventional Cardiology

## 2021-05-19 ENCOUNTER — Ambulatory Visit: Payer: Medicare Other

## 2021-05-19 ENCOUNTER — Encounter (HOSPITAL_COMMUNITY): Payer: Medicare Other

## 2021-05-20 DIAGNOSIS — K120 Recurrent oral aphthae: Secondary | ICD-10-CM | POA: Diagnosis not present

## 2021-05-22 ENCOUNTER — Other Ambulatory Visit: Payer: Medicare Other | Admitting: *Deleted

## 2021-05-22 ENCOUNTER — Other Ambulatory Visit: Payer: Self-pay

## 2021-05-22 DIAGNOSIS — I25118 Atherosclerotic heart disease of native coronary artery with other forms of angina pectoris: Secondary | ICD-10-CM

## 2021-05-22 DIAGNOSIS — Z79899 Other long term (current) drug therapy: Secondary | ICD-10-CM | POA: Diagnosis not present

## 2021-05-22 LAB — BASIC METABOLIC PANEL
BUN/Creatinine Ratio: 20 (ref 12–28)
BUN: 20 mg/dL (ref 8–27)
CO2: 19 mmol/L — ABNORMAL LOW (ref 20–29)
Calcium: 10 mg/dL (ref 8.7–10.3)
Chloride: 105 mmol/L (ref 96–106)
Creatinine, Ser: 0.99 mg/dL (ref 0.57–1.00)
Glucose: 94 mg/dL (ref 65–99)
Potassium: 5 mmol/L (ref 3.5–5.2)
Sodium: 141 mmol/L (ref 134–144)
eGFR: 55 mL/min/{1.73_m2} — ABNORMAL LOW (ref >59)

## 2021-05-25 ENCOUNTER — Telehealth: Payer: Self-pay | Admitting: Interventional Cardiology

## 2021-05-25 NOTE — Telephone Encounter (Signed)
Left the pt a message to call the office back. 

## 2021-05-25 NOTE — Telephone Encounter (Signed)
Pt returning phone call for lab results... please advise.

## 2021-05-29 NOTE — Telephone Encounter (Signed)
Left a message for patient to call back. 

## 2021-06-01 NOTE — Telephone Encounter (Signed)
Called patient and reviewed results.  Please see result note.

## 2021-06-03 DIAGNOSIS — E782 Mixed hyperlipidemia: Secondary | ICD-10-CM | POA: Diagnosis not present

## 2021-06-03 DIAGNOSIS — I10 Essential (primary) hypertension: Secondary | ICD-10-CM | POA: Diagnosis not present

## 2021-06-03 DIAGNOSIS — J449 Chronic obstructive pulmonary disease, unspecified: Secondary | ICD-10-CM | POA: Diagnosis not present

## 2021-06-03 DIAGNOSIS — I739 Peripheral vascular disease, unspecified: Secondary | ICD-10-CM | POA: Diagnosis not present

## 2021-06-10 ENCOUNTER — Other Ambulatory Visit: Payer: Self-pay

## 2021-06-10 ENCOUNTER — Ambulatory Visit (HOSPITAL_COMMUNITY)
Admission: RE | Admit: 2021-06-10 | Discharge: 2021-06-10 | Disposition: A | Discharge status: Home / Self Care | Payer: Medicare Other | Source: Ambulatory Visit | Attending: Vascular Surgery | Admitting: Vascular Surgery

## 2021-06-10 ENCOUNTER — Ambulatory Visit (INDEPENDENT_AMBULATORY_CARE_PROVIDER_SITE_OTHER)
Admission: RE | Admit: 2021-06-10 | Discharge: 2021-06-10 | Disposition: A | Discharge status: Home / Self Care | Payer: Medicare Other | Source: Ambulatory Visit | Attending: Vascular Surgery | Admitting: Vascular Surgery

## 2021-06-10 DIAGNOSIS — I739 Peripheral vascular disease, unspecified: Secondary | ICD-10-CM | POA: Insufficient documentation

## 2021-06-11 ENCOUNTER — Encounter: Payer: Self-pay | Admitting: Vascular Surgery

## 2021-06-11 ENCOUNTER — Ambulatory Visit: Payer: Medicare Other | Admitting: Vascular Surgery

## 2021-06-11 VITALS — BP 144/62 | HR 60 | Temp 98.2°F | Resp 16 | Ht <= 58 in | Wt 116.0 lb

## 2021-06-11 DIAGNOSIS — I739 Peripheral vascular disease, unspecified: Secondary | ICD-10-CM | POA: Diagnosis not present

## 2021-06-11 NOTE — Progress Notes (Signed)
Patient is an 87 year old female who returns for follow-up today.  She was last seen May 07, 2021.  We have followed her for several years for peripheral arterial disease.  She has had slow decline in her ABIs.  She complains of pain primarily in the left leg after walking about 10 yards.  She also gets pain in the buttocks on the left side which will radiate all the way down to the left foot.  She has similar symptoms in the right leg but these do not occur as quickly.  She has a history of COPD and becomes short of breath after walking a little more than 10 yards.  She does not have rest pain.  She does not have any open wounds.  The pain in her leg recovers after a few minutes and she can go again.  She states her pain may have improved a little bit since her last office visit as she has been walking more.  She did have a GI bleed a few weeks ago but did not require transfusion.  Upper endoscopy showed nonbleeding gastric ulcer.  She was seen by cardiology on June 6.  At that time as they stopped her Plavix.  They are considering restarting her on 81 mg of aspirin.  Past Medical History:  Diagnosis Date   Allergic rhinitis    Arthritis    "maybe a little in my knees and back" (07/17/2018)   Chronic lower back pain    Colitis 12/2013   "never had this before"   COPD (chronic obstructive pulmonary disease) (HCC)    "questionable/Dr. Katrinka Blazing" (07/17/2018)   Coronary artery disease    Exertional shortness of breath    "off and on" (07/17/2018)   Family history of anesthesia complication    "mother; think they gave her too much; c/o pain after OR; gave her more RX; they had to bring her back real quick"   H/O calcium pyrophosphate deposition disease (CPPD)    Heart murmur    Hiatal hernia    Hyperlipidemia    Hypertension    Numbness in feet    On home oxygen therapy    "I've used it 2 times in 2 yrs" (07/17/2018)   PAD (peripheral artery disease) (HCC)    Pneumonia    "1st grade & 4th grade"  (07/17/2018)   Proteinuria    Shingles    Stenosis of tear duct     Past Surgical History:  Procedure Laterality Date   ABDOMINAL HYSTERECTOMY  1960's   APPENDECTOMY     BACK SURGERY     BIOPSY  04/20/2021   Procedure: BIOPSY;  Surgeon: Jeani Hawking, MD;  Location: [facility];  Service: Endoscopy;;   BLEPHAROPLASTY Bilateral 05/2017   CARDIAC CATHETERIZATION     CATARACT EXTRACTION, BILATERAL Bilateral    CESAREAN SECTION  1957; 1960   CORONARY STENT INTERVENTION N/A 07/17/2018   Procedure: CORONARY STENT INTERVENTION;  Surgeon: Lyn Records, MD;  Location: [facility] INVASIVE CV LAB;  Service: Cardiovascular;  Laterality: N/A;   DACROCYSTORHINOSTOMY W/ JONES TUBE Bilateral 05/2017   "put tear duct in my eyes"   ESOPHAGOGASTRODUODENOSCOPY (EGD) WITH PROPOFOL N/A 04/20/2021   Procedure: ESOPHAGOGASTRODUODENOSCOPY (EGD) WITH PROPOFOL;  Surgeon: Jeani Hawking, MD;  Location: [facility];  Service: Endoscopy;  Laterality: N/A;   EYE SURGERY     LAPAROSCOPIC CHOLECYSTECTOMY  ~ 2009   LUMBAR DISC SURGERY  ?2004   RIGHT/LEFT HEART CATH AND CORONARY ANGIOGRAPHY N/A 07/17/2018   Procedure: RIGHT/LEFT HEART  CATH AND CORONARY ANGIOGRAPHY;  Surgeon: Lyn Records, MD;  Location: [facility] INV[facility]IVE CV LAB;  Service: Cardiovascular;  Laterality: N/A;   TONSILLECTOMY AND ADENOIDECTOMY  ?1940's    Social History   Socioeconomic History   Marital status: Married    Spouse name: Delton See   Number of children: Not on file   Years of education: 12   Highest education level: High school graduate  Occupational History   Occupation: Retired  Tobacco Use   Smoking status: Never   Smokeless tobacco: Never  Vaping Use   Vaping Use: Never used  Substance and Sexual Activity   Alcohol use: Yes    Alcohol/week: 4.0 standard drinks    Types: 4 Glasses of wine per week    Comment: glass of wine every other night   Drug use: Never   Sexual activity: Not Currently  Other Topics Concern   Not on file  Social  History Narrative   Not on file   Social Determinants of Health   Financial Resource Strain: Not on file  Food Insecurity: Not on file  Transportation Needs: Not on file  Physical Activity: Not on file  Stress: Not on file  Social Connections: Not on file  Intimate Partner Violence: Not on file    Current Outpatient Medications on File Prior to Visit  Medication Sig Dispense Refill   albuterol (PROVENTIL) (2.5 MG/3ML) 0.083% nebulizer solution Take 3 mLs (2.5 mg total) by nebulization every 4 (four) hours as needed for wheezing or shortness of breath. 75 mL 0   aspirin EC 81 MG tablet Take 1 tablet (81 mg total) by mouth daily. Swallow whole. 90 tablet 3   Budeson-Glycopyrrol-Formoterol (BREZTRI AEROSPHERE) 160-9-4.8 MCG/ACT AERO Inhale 2 puffs into the lungs in the morning and at bedtime.     cholecalciferol (VITAMIN D) 1000 units tablet Take 1,000 Units by mouth at bedtime.     Denosumab (PROLIA Spencer) Inject 1 Dose as directed every 6 (six) months. Use as directed.     Evolocumab (REPATHA SURECLICK) 140 MG/ML SOAJ INJECT 1 PEN INTO THE SKIN EVERY 14 DAYS 2 mL 11   ezetimibe (ZETIA) 10 MG tablet Take 1 tablet (10 mg total) by mouth daily. 30 tablet 5   lisinopril-hydrochlorothiazide (ZESTORETIC) 20-25 MG tablet Take 0.5 tablets by mouth at bedtime. 45 tablet 3   Magnesium Cl-Calcium Carbonate (SLOW-MAG PO) Take 1 tablet by mouth daily.     metoprolol succinate (TOPROL-XL) 25 MG 24 hr tablet Take 1/2 (one-half) tablet by mouth once daily 45 tablet 0   nitroGLYCERIN (NITROSTAT) 0.4 MG SL tablet Place 0.4 mg under the tongue every 5 (five) minutes as needed for chest pain.     pantoprazole (PROTONIX) 40 MG tablet Take 1 tablet (40 mg total) by mouth 2 (two) times daily for 30 days, THEN 1 tablet (40 mg total) daily. 120 tablet 0   No current facility-administered medications on file prior to visit.   Physical exam:  Vitals:   06/11/21 1259  BP: (!) 144/62  Pulse: 60  Resp: 16  Temp:  98.2 F (36.8 C)  SpO2: 99%  Weight: 116 lb (52.6 kg)  Height: 4\' 9"  (1.448 m)   Extremities no palpable pedal pulses bilaterally, 1+ right femoral absent left femoral Skin: No ulcers on feet  Data: I reviewed the patient's aortoiliac and lower extremity duplex scan from July 6.  This shows a greater than 50% stenosis of the distal aorta and bilateral common iliac arteries.  The right  superficial femoral artery is occluded.  Left superficial femoral artery is occluded.  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  Her primary symptoms are claudication.  She does not currently have rest pain or tissue loss.  Although she is somewhat limited by her walking distance we had a very frank discussion about interventions for her arteries to try to improve her walking distance.  We also discussed that there would be some risk involved in that.  I would not consider her for an aortobifemoral operation for axillary bifemoral operation for claudication symptoms alone.  I think she would be high risk in light of her age and comorbidities.  She and her daughter were in agreement with this.  I did offer her aortogram lower extremity runoff with a possible intervention in her aortoiliac segment to improve her walking distance.  I also did discuss with him that it would not make her walking perfect as she does have superficial femoral artery occlusions as well.  She previously had a fairly extensive bleeding episode with a catheterization in her right arm and had some apprehension about having another percutaneous intervention.  She is going to discuss this with her family further.  She will call me in the next few weeks if she wishes to pursue arteriogram.  Otherwise I will have her follow-up in 3 months time with my partner Dr. Clotilde Dieter since I will be retiring at the end of August so that she has appropriate  follow-up.  All of this was discussed with the patient and her daughter today.  Fabienne Bruns, MD [facility] Office: [phone]

## 2021-06-11 NOTE — H&P (View-Only) (Signed)
Patient is an 87 year old female who returns for follow-up today.  She was last seen May 07, 2021.  We have followed her for several years for peripheral arterial disease.  She has had slow decline in her ABIs.  She complains of pain primarily in the left leg after walking about 10 yards.  She also gets pain in the buttocks on the left side which will radiate all the way down to the left foot.  She has similar symptoms in the right leg but these do not occur as quickly.  She has a history of COPD and becomes short of breath after walking a little more than 10 yards.  She does not have rest pain.  She does not have any open wounds.  The pain in her leg recovers after a few minutes and she can go again.  She states her pain may have improved a little bit since her last office visit as she has been walking more.  She did have a GI bleed a few weeks ago but did not require transfusion.  Upper endoscopy showed nonbleeding gastric ulcer.  She was seen by cardiology on June 6.  At that time as they stopped her Plavix.  They are considering restarting her on 81 mg of aspirin.  Past Medical History:  Diagnosis Date   Allergic rhinitis    Arthritis    "maybe a little in my knees and back" (07/17/2018)   Chronic lower back pain    Colitis 12/2013   "never had this before"   COPD (chronic obstructive pulmonary disease) (HCC)    "questionable/Dr. Katrinka Blazing" (07/17/2018)   Coronary artery disease    Exertional shortness of breath    "off and on" (07/17/2018)   Family history of anesthesia complication    "mother; think they gave her too much; c/o pain after OR; gave her more RX; they had to bring her back real quick"   H/O calcium pyrophosphate deposition disease (CPPD)    Heart murmur    Hiatal hernia    Hyperlipidemia    Hypertension    Numbness in feet    On home oxygen therapy    "I've used it 2 times in 2 yrs" (07/17/2018)   PAD (peripheral artery disease) (HCC)    Pneumonia    "1st grade & 4th grade"  (07/17/2018)   Proteinuria    Shingles    Stenosis of tear duct     Past Surgical History:  Procedure Laterality Date   ABDOMINAL HYSTERECTOMY  1960's   APPENDECTOMY     BACK SURGERY     BIOPSY  04/20/2021   Procedure: BIOPSY;  Surgeon: Jeani Hawking, MD;  Location: [facility];  Service: Endoscopy;;   BLEPHAROPLASTY Bilateral 05/2017   CARDIAC CATHETERIZATION     CATARACT EXTRACTION, BILATERAL Bilateral    CESAREAN SECTION  1957; 1960   CORONARY STENT INTERVENTION N/A 07/17/2018   Procedure: CORONARY STENT INTERVENTION;  Surgeon: Lyn Records, MD;  Location: [facility];  Service: Cardiovascular;  Laterality: N/A;   DACROCYSTORHINOSTOMY W/ JONES TUBE Bilateral 05/2017   "put tear duct in my eyes"   ESOPHAGOGASTRODUODENOSCOPY (EGD) WITH PROPOFOL N/A 04/20/2021   Procedure: ESOPHAGOGASTRODUODENOSCOPY (EGD) WITH PROPOFOL;  Surgeon: Jeani Hawking, MD;  Location: [facility];  Service: Endoscopy;  Laterality: N/A;   EYE SURGERY     LAPAROSCOPIC CHOLECYSTECTOMY  ~ 2009   LUMBAR DISC SURGERY  ?2004   RIGHT/LEFT HEART CATH AND CORONARY ANGIOGRAPHY N/A 07/17/2018   Procedure: RIGHT/LEFT HEART  CATH AND CORONARY ANGIOGRAPHY;  Surgeon: Lyn Records, MD;  Location: [facility] INV[facility]IVE CV LAB;  Service: Cardiovascular;  Laterality: N/A;   TONSILLECTOMY AND ADENOIDECTOMY  ?1940's    Social History   Socioeconomic History   Marital status: Married    Spouse name: Delton See   Number of children: Not on file   Years of education: 12   Highest education level: High school graduate  Occupational History   Occupation: Retired  Tobacco Use   Smoking status: Never   Smokeless tobacco: Never  Vaping Use   Vaping Use: Never used  Substance and Sexual Activity   Alcohol use: Yes    Alcohol/week: 4.0 standard drinks    Types: 4 Glasses of wine per week    Comment: glass of wine every other night   Drug use: Never   Sexual activity: Not Currently  Other Topics Concern   Not on file  Social  History Narrative   Not on file   Social Determinants of Health   Financial Resource Strain: Not on file  Food Insecurity: Not on file  Transportation Needs: Not on file  Physical Activity: Not on file  Stress: Not on file  Social Connections: Not on file  Intimate Partner Violence: Not on file    Current Outpatient Medications on File Prior to Visit  Medication Sig Dispense Refill   albuterol (PROVENTIL) (2.5 MG/3ML) 0.083% nebulizer solution Take 3 mLs (2.5 mg total) by nebulization every 4 (four) hours as needed for wheezing or shortness of breath. 75 mL 0   aspirin EC 81 MG tablet Take 1 tablet (81 mg total) by mouth daily. Swallow whole. 90 tablet 3   Budeson-Glycopyrrol-Formoterol (BREZTRI AEROSPHERE) 160-9-4.8 MCG/ACT AERO Inhale 2 puffs into the lungs in the morning and at bedtime.     cholecalciferol (VITAMIN D) 1000 units tablet Take 1,000 Units by mouth at bedtime.     Denosumab (PROLIA Spencer) Inject 1 Dose as directed every 6 (six) months. Use as directed.     Evolocumab (REPATHA SURECLICK) 140 MG/ML SOAJ INJECT 1 PEN INTO THE SKIN EVERY 14 DAYS 2 mL 11   ezetimibe (ZETIA) 10 MG tablet Take 1 tablet (10 mg total) by mouth daily. 30 tablet 5   lisinopril-hydrochlorothiazide (ZESTORETIC) 20-25 MG tablet Take 0.5 tablets by mouth at bedtime. 45 tablet 3   Magnesium Cl-Calcium Carbonate (SLOW-MAG PO) Take 1 tablet by mouth daily.     metoprolol succinate (TOPROL-XL) 25 MG 24 hr tablet Take 1/2 (one-half) tablet by mouth once daily 45 tablet 0   nitroGLYCERIN (NITROSTAT) 0.4 MG SL tablet Place 0.4 mg under the tongue every 5 (five) minutes as needed for chest pain.     pantoprazole (PROTONIX) 40 MG tablet Take 1 tablet (40 mg total) by mouth 2 (two) times daily for 30 days, THEN 1 tablet (40 mg total) daily. 120 tablet 0   No current facility-administered medications on file prior to visit.   Physical exam:  Vitals:   06/11/21 1259  BP: (!) 144/62  Pulse: 60  Resp: 16  Temp:  98.2 F (36.8 C)  SpO2: 99%  Weight: 116 lb (52.6 kg)  Height: 4\' 9"  (1.448 m)   Extremities no palpable pedal pulses bilaterally, 1+ right femoral absent left femoral Skin: No ulcers on feet  Data: I reviewed the patient's aortoiliac and lower extremity duplex scan from July 6.  This shows a greater than 50% stenosis of the distal aorta and bilateral common iliac arteries.  The right  superficial femoral artery is occluded.  Left superficial femoral artery is occluded.  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  Her primary symptoms are claudication.  She does not currently have rest pain or tissue loss.  Although she is somewhat limited by her walking distance we had a very frank discussion about interventions for her arteries to try to improve her walking distance.  We also discussed that there would be some risk involved in that.  I would not consider her for an aortobifemoral operation for axillary bifemoral operation for claudication symptoms alone.  I think she would be high risk in light of her age and comorbidities.  She and her daughter were in agreement with this.  I did offer her aortogram lower extremity runoff with a possible intervention in her aortoiliac segment to improve her walking distance.  I also did discuss with him that it would not make her walking perfect as she does have superficial femoral artery occlusions as well.  She previously had a fairly extensive bleeding episode with a catheterization in her right arm and had some apprehension about having another percutaneous intervention.  She is going to discuss this with her family further.  She will call me in the next few weeks if she wishes to pursue arteriogram.  Otherwise I will have her follow-up in 3 months time with my partner Dr. Clotilde Dieter since I will be retiring at the end of August so that she has appropriate  follow-up.  All of this was discussed with the patient and her daughter today.  Fabienne Bruns, MD [facility] Office: [phone]

## 2021-06-12 ENCOUNTER — Other Ambulatory Visit: Payer: Self-pay

## 2021-06-12 DIAGNOSIS — I739 Peripheral vascular disease, unspecified: Secondary | ICD-10-CM

## 2021-06-25 ENCOUNTER — Other Ambulatory Visit: Payer: Self-pay | Admitting: Interventional Cardiology

## 2021-06-25 DIAGNOSIS — E785 Hyperlipidemia, unspecified: Secondary | ICD-10-CM

## 2021-06-25 DIAGNOSIS — I25118 Atherosclerotic heart disease of native coronary artery with other forms of angina pectoris: Secondary | ICD-10-CM

## 2021-06-25 DIAGNOSIS — I70219 Atherosclerosis of native arteries of extremities with intermittent claudication, unspecified extremity: Secondary | ICD-10-CM

## 2021-07-08 ENCOUNTER — Telehealth: Payer: Self-pay

## 2021-07-08 ENCOUNTER — Other Ambulatory Visit: Payer: Self-pay

## 2021-07-08 NOTE — Telephone Encounter (Signed)
Pt contacted office ready to schedule arteriogram with Dr Darrick Penna. Procedure scheduled for Aug 5. Pt voiced understanding.

## 2021-07-10 ENCOUNTER — Other Ambulatory Visit: Payer: Self-pay

## 2021-07-10 ENCOUNTER — Encounter (HOSPITAL_COMMUNITY)
Admission: RE | Disposition: A | Discharge status: Home / Self Care | Payer: Self-pay | Source: Ambulatory Visit | Attending: Vascular Surgery

## 2021-07-10 ENCOUNTER — Ambulatory Visit (HOSPITAL_COMMUNITY)
Admission: RE | Admit: 2021-07-10 | Discharge: 2021-07-10 | Disposition: A | Discharge status: Home / Self Care | Payer: Medicare Other | Source: Ambulatory Visit | Attending: Vascular Surgery | Admitting: Vascular Surgery

## 2021-07-10 DIAGNOSIS — Z79899 Other long term (current) drug therapy: Secondary | ICD-10-CM | POA: Insufficient documentation

## 2021-07-10 DIAGNOSIS — I2510 Atherosclerotic heart disease of native coronary artery without angina pectoris: Secondary | ICD-10-CM | POA: Diagnosis not present

## 2021-07-10 DIAGNOSIS — J449 Chronic obstructive pulmonary disease, unspecified: Secondary | ICD-10-CM | POA: Insufficient documentation

## 2021-07-10 DIAGNOSIS — I70212 Atherosclerosis of native arteries of extremities with intermittent claudication, left leg: Secondary | ICD-10-CM | POA: Insufficient documentation

## 2021-07-10 DIAGNOSIS — Z90710 Acquired absence of both cervix and uterus: Secondary | ICD-10-CM | POA: Insufficient documentation

## 2021-07-10 DIAGNOSIS — E785 Hyperlipidemia, unspecified: Secondary | ICD-10-CM | POA: Insufficient documentation

## 2021-07-10 DIAGNOSIS — Z7982 Long term (current) use of aspirin: Secondary | ICD-10-CM | POA: Insufficient documentation

## 2021-07-10 DIAGNOSIS — Z8619 Personal history of other infectious and parasitic diseases: Secondary | ICD-10-CM | POA: Insufficient documentation

## 2021-07-10 DIAGNOSIS — Z955 Presence of coronary angioplasty implant and graft: Secondary | ICD-10-CM | POA: Diagnosis not present

## 2021-07-10 DIAGNOSIS — Z9049 Acquired absence of other specified parts of digestive tract: Secondary | ICD-10-CM | POA: Insufficient documentation

## 2021-07-10 DIAGNOSIS — I10 Essential (primary) hypertension: Secondary | ICD-10-CM | POA: Insufficient documentation

## 2021-07-10 DIAGNOSIS — Z9981 Dependence on supplemental oxygen: Secondary | ICD-10-CM | POA: Insufficient documentation

## 2021-07-10 HISTORY — PX: ABDOMINAL AORTOGRAM W/LOWER EXTREMITY: CATH118223

## 2021-07-10 HISTORY — PX: PERIPHERAL VASCULAR INTERVENTION: CATH118257

## 2021-07-10 LAB — TROPONIN I (HIGH SENSITIVITY): Troponin I (High Sensitivity): 8 ng/L (ref <18)

## 2021-07-10 LAB — POCT ACTIVATED CLOTTING TIME
Activated Clotting Time: 184 seconds
Activated Clotting Time: 179 seconds

## 2021-07-10 LAB — POCT I-STAT, CHEM 8
BUN: 29 mg/dL — ABNORMAL HIGH (ref 8–23)
Calcium, Ion: 1.28 mmol/L (ref 1.15–1.40)
Chloride: 110 mmol/L (ref 98–111)
Creatinine, Ser: 1 mg/dL (ref 0.44–1.00)
Glucose, Bld: 99 mg/dL (ref 70–99)
HCT: 35 % — ABNORMAL LOW (ref 36.0–46.0)
Hemoglobin: 11.9 g/dL — ABNORMAL LOW (ref 12.0–15.0)
Potassium: 5.2 mmol/L — ABNORMAL HIGH (ref 3.5–5.1)
Sodium: 140 mmol/L (ref 135–145)
TCO2: 24 mmol/L (ref 22–32)

## 2021-07-10 SURGERY — ABDOMINAL AORTOGRAM W/LOWER EXTREMITY
Anesthesia: LOCAL

## 2021-07-10 MED ORDER — LIDOCAINE HCL (PF) 1 % IJ SOLN
INTRAMUSCULAR | Status: AC
  Filled 2021-07-10: qty 30

## 2021-07-10 MED ORDER — SODIUM CHLORIDE 0.9% FLUSH: 3.0000 mL | Freq: Two times a day (BID) | INTRAVENOUS | Status: DC

## 2021-07-10 MED ORDER — FENTANYL CITRATE (PF) 100 MCG/2ML IJ SOLN
INTRAMUSCULAR | Status: DC | PRN
  Administered 2021-07-10 (×2): 25 ug via INTRAVENOUS

## 2021-07-10 MED ORDER — MIDAZOLAM HCL 2 MG/2ML IJ SOLN
INTRAMUSCULAR | Status: AC
  Filled 2021-07-10: qty 2

## 2021-07-10 MED ORDER — LIDOCAINE HCL (PF) 1 % IJ SOLN
INTRAMUSCULAR | Status: DC | PRN
  Administered 2021-07-10: 15 mL

## 2021-07-10 MED ORDER — HEPARIN SODIUM (PORCINE) 1000 UNIT/ML IJ SOLN
INTRAMUSCULAR | Status: AC
  Filled 2021-07-10: qty 1

## 2021-07-10 MED ORDER — HEPARIN (PORCINE) IN NACL 1000-0.9 UT/500ML-% IV SOLN
INTRAVENOUS | Status: DC | PRN
  Administered 2021-07-10 (×2): 500 mL

## 2021-07-10 MED ORDER — HYDRALAZINE HCL 20 MG/ML IJ SOLN
INTRAMUSCULAR | Status: AC
  Filled 2021-07-10: qty 1

## 2021-07-10 MED ORDER — FAMOTIDINE IN NACL 20-0.9 MG/50ML-% IV SOLN
20.0000 mg | Freq: Once | INTRAVENOUS | Status: AC
  Administered 2021-07-10: 20 mg via INTRAVENOUS
  Filled 2021-07-10: qty 50

## 2021-07-10 MED ORDER — ONDANSETRON HCL 4 MG/2ML IJ SOLN
4.0000 mg | Freq: Four times a day (QID) | INTRAMUSCULAR | Status: DC | PRN
  Administered 2021-07-10: 4 mg via INTRAVENOUS
  Filled 2021-07-10: qty 2

## 2021-07-10 MED ORDER — IODIXANOL 320 MG/ML IV SOLN
INTRAVENOUS | Status: DC | PRN
  Administered 2021-07-10: 165 mL via INTRA_ARTERIAL

## 2021-07-10 MED ORDER — ALUM & MAG HYDROXIDE-SIMETH 200-200-20 MG/5ML PO SUSP
30.0000 mL | Freq: Once | ORAL | Status: AC
  Administered 2021-07-10: 30 mL via ORAL
  Filled 2021-07-10: qty 30

## 2021-07-10 MED ORDER — FENTANYL CITRATE (PF) 100 MCG/2ML IJ SOLN
INTRAMUSCULAR | Status: AC
  Filled 2021-07-10: qty 2

## 2021-07-10 MED ORDER — LABETALOL HCL 5 MG/ML IV SOLN: 10.0000 mg | INTRAVENOUS | Status: DC | PRN

## 2021-07-10 MED ORDER — ALUM & MAG HYDROXIDE-SIMETH 200-200-20 MG/5ML PO SUSP
ORAL | Status: AC
  Filled 2021-07-10: qty 30

## 2021-07-10 MED ORDER — HYDRALAZINE HCL 20 MG/ML IJ SOLN
5.0000 mg | INTRAMUSCULAR | Status: DC | PRN
  Administered 2021-07-10: 5 mg via INTRAVENOUS

## 2021-07-10 MED ORDER — SODIUM CHLORIDE 0.9 % IV SOLN: 250.0000 mL | INTRAVENOUS | Status: DC | PRN

## 2021-07-10 MED ORDER — FAMOTIDINE 20 MG PO TABS: 20.0000 mg | ORAL_TABLET | Freq: Every day | ORAL | Status: DC

## 2021-07-10 MED ORDER — FAMOTIDINE IN NACL 20-0.9 MG/50ML-% IV SOLN
INTRAVENOUS | Status: AC
  Filled 2021-07-10: qty 50

## 2021-07-10 MED ORDER — CLOPIDOGREL BISULFATE 75 MG PO TABS: 75.0000 mg | ORAL_TABLET | Freq: Every day | ORAL | Status: DC

## 2021-07-10 MED ORDER — MORPHINE SULFATE (PF) 2 MG/ML IV SOLN: 2.0000 mg | INTRAVENOUS | Status: DC | PRN

## 2021-07-10 MED ORDER — ACETAMINOPHEN 325 MG PO TABS
650.0000 mg | ORAL_TABLET | ORAL | Status: DC | PRN
  Administered 2021-07-10: 650 mg via ORAL
  Filled 2021-07-10: qty 2

## 2021-07-10 MED ORDER — CLOPIDOGREL BISULFATE 300 MG PO TABS
ORAL_TABLET | ORAL | Status: AC
  Filled 2021-07-10: qty 1

## 2021-07-10 MED ORDER — CLOPIDOGREL BISULFATE 300 MG PO TABS
ORAL_TABLET | ORAL | Status: DC | PRN
  Administered 2021-07-10: 300 mg via ORAL

## 2021-07-10 MED ORDER — CLOPIDOGREL BISULFATE 75 MG PO TABS: 300.0000 mg | ORAL_TABLET | Freq: Once | ORAL | Status: DC

## 2021-07-10 MED ORDER — OXYCODONE HCL 5 MG PO TABS: 5.0000 mg | ORAL_TABLET | ORAL | Status: DC | PRN

## 2021-07-10 MED ORDER — HEPARIN SODIUM (PORCINE) 1000 UNIT/ML IJ SOLN
INTRAMUSCULAR | Status: DC | PRN
  Administered 2021-07-10: 2000 [IU] via INTRAVENOUS
  Administered 2021-07-10: 6000 [IU] via INTRAVENOUS

## 2021-07-10 MED ORDER — SODIUM CHLORIDE 0.9 % IV SOLN: INTRAVENOUS | Status: AC

## 2021-07-10 MED ORDER — MIDAZOLAM HCL 2 MG/2ML IJ SOLN
INTRAMUSCULAR | Status: DC | PRN
  Administered 2021-07-10 (×2): 1 mg via INTRAVENOUS

## 2021-07-10 MED ORDER — SODIUM CHLORIDE 0.9% FLUSH: 3.0000 mL | INTRAVENOUS | Status: DC | PRN

## 2021-07-10 MED ORDER — SODIUM CHLORIDE 0.9 % IV SOLN
INTRAVENOUS | Status: DC
Start: 2021-07-10 — End: 2021-07-10

## 2021-07-10 MED ORDER — CLOPIDOGREL BISULFATE 75 MG PO TABS: 75.0000 mg | ORAL_TABLET | Freq: Every day | ORAL | 11 refills | Status: DC

## 2021-07-10 SURGICAL SUPPLY — 16 items
BALLN MUSTANG 5.0X40 75 (BALLOONS) ×3
BALLOON MUSTANG 5.0X40 75 (BALLOONS) ×2 IMPLANT
CATH ANGIO 5F PIGTAIL 65CM (CATHETERS) ×3 IMPLANT
DEVICE CONTINUOUS FLUSH (MISCELLANEOUS) ×3 IMPLANT
KIT PV (KITS) ×3 IMPLANT
SHEATH BRITE TIP 7FR 35CM (SHEATH) ×3 IMPLANT
SHEATH PINNACLE 5F 10CM (SHEATH) ×3 IMPLANT
SHEATH PROBE COVER 6X72 (BAG) ×3 IMPLANT
STENT INNOVA 5X40X130 (Permanent Stent) ×3 IMPLANT
STENT VIABAHN 8X29X80 VBX (Permanent Stent) ×3 IMPLANT
STENT VIABAHNBX 8X59X80 (Permanent Stent) ×3 IMPLANT
SYR MEDRAD MARK V 150ML (SYRINGE) ×3 IMPLANT
TRANSDUCER W/STOPCOCK (MISCELLANEOUS) ×3 IMPLANT
TRAY PV CATH (CUSTOM PROCEDURE TRAY) ×3 IMPLANT
WIRE HITORQ VERSACORE ST 145CM (WIRE) ×3 IMPLANT
WIRE VERSACORE LOC 115CM (WIRE) ×12 IMPLANT

## 2021-07-10 NOTE — Interval H&P Note (Signed)
History and Physical Interval Note:  07/10/2021 11:20 AM  Sydney Gross  has presented today for surgery, with the diagnosis of PAD.  The various methods of treatment have been discussed with the patient and family. After consideration of risks, benefits and other options for treatment, the patient has consented to  Procedure(s): ABDOMINAL AORTOGRAM W/LOWER EXTREMITY (N/A) as a surgical intervention.  The patient's history has been reviewed, patient examined, no change in status, stable for surgery.  I have reviewed the patient's chart and labs.  Questions were answered to the patient's satisfaction.     Fabienne Bruns

## 2021-07-10 NOTE — Progress Notes (Signed)
Pt daughter Cordelia Pen called and updated  states she will come over in a few hours.

## 2021-07-10 NOTE — Progress Notes (Signed)
RN noticed hematoma to left groin level 1. Held pressure for 15 minutes. Hematoma resolved. Dr. Darrick Penna notified. Will continue to monitor.

## 2021-07-10 NOTE — Progress Notes (Signed)
RN showed EKG to Dr. Darrick Penna, no new orders obtained, informed troponin x 1 sent, ok to move pt to short stay after sheath removed, please f/u on troponin level, safety maintained Pt states lower, mid chest pain/pressure is 4/10, pt appears more relaxed

## 2021-07-10 NOTE — Op Note (Signed)
Procedure: Ultrasound left groin, abdominal aortogram with bilateral lower extremity runoff, left common iliac stent (8 x 59, 8 x 29 VBX), left external iliac stent (5 x 40 Inova)  Preoperative diagnosis: Claudication  Postoperative diagnosis: Same  Anesthesia: Local with IV sedation, sedation time was 6 69 minutes  Operative findings: 1.  Complete occlusion left common iliac and proximal left external iliac artery  2.  Bilateral flush occlusions superficial femoral artery  3.  Reconstitution below-knee popliteal artery with three-vessel runoff  Operative details: After obtaining form consent, the patient was taken the PV lab.  The patient was placed in supine position angio table.  Both groins were prepped and draped in usual sterile fashion.  Ultrasound was used to identify the left common femoral artery and femoral bifurcation.  Local anesthesia was infiltrated over the left common femoral artery.  An introducer needle was then used to cannulate the left common femoral artery and an 035 versa core wire threaded in the abdominal aorta under fluoroscopic guidance.  Next a 5 French sheath placed over the guidewire in the left common femoral artery.  This was thoroughly flushed with heparinized saline.  A 5 French pigtail catheter was then advanced over the guidewire and abdominal aortogram obtained in AP projection.  Left and right renal arteries are widely patent.  The infrarenal abdominal aorta is widely patent.  The right common iliac is patent.  The right external iliac artery has atherosclerotic change but no flow-limiting lesion.  The right internal iliac artery is patent but small and diffusely diseased.  In the left side the left common iliac artery is completely occluded at its origin the left internal iliac artery is nonvisualized, the left external iliac artery is occluded in its proximal two thirds.  An oblique view of the pelvis was performed to confirm the above findings.  At this  point bilateral extremity runoff views were obtained through the pigtail catheter.  In the right lower extremity, the right common femoral artery is patent.  The right profunda femoris artery is patent.  The right superficial femoral artery is occluded is a flush occlusion and the below-knee popliteal artery reconstitutes with three-vessel runoff to the right foot.  In the left lower extremity, there are similar findings.  At this point I decided to intervene on the iliac artery occlusion.  The patient was given 8000 units of intravenous heparin.  The 5 French sheath was swapped out over guidewire for a 7 [place] long Brite tip sheath.  Using roadmapping techniques an 8 x 59 VBX stent was selected and deployed from the left common iliac origin to the distal portion of the left common iliac.  Completion angiogram showed that we now have reconstitution of the left internal iliac artery which was small and diffusely diseased but now patent.  The left external iliac artery still had a 70% stenosis.  There was also an area of what appeared to be soft plaque distal to our first stent in the left common iliac artery.  At this point the left external iliac artery was treated with a 5 x 40 Inova self-expanding stent.  This was then postdilated with a 5 balloon.  This was done to nominal pressure.  Repeat angiogram showed good deployment without dissection of the external iliac artery stent.  I then brought up an 8 x 29 VBX stent and overlapped this with the first 1 and deployed this to nominal pressure.  Completion angiogram showed good wall apposition of both 8 mm stents  with the left common iliac artery now widely patent with flow into the left internal iliac artery and a widely patent left external iliac artery.  At this point the guidewires were removed.  The sheath was left in place to be pulled in the holding area the patient tolerated procedure well and there were no complications.  Patient was taken to the  holding area in stable condition.  Operative management: The patient now has widely patent left iliac system.  She has a flush occlusion of the superficial femoral artery bilaterally.  In light of her age we will see how her symptoms progress over the next 4 to 6 weeks and see if she has had improvement of her walking distance.  Since she is 87 years old unless she has fairly debilitating symptoms I would be reluctant to proceed with a bypass operation for either leg.  I believe that she will be significantly improved by today's procedure.  She was placed on Plavix and aspirin.  The Plavix should be continued for at least 6 weeks.  I would prefer to continue the Plavix lifetime as long as she tolerates this.  Fabienne Bruns, MD [facility] [place] Office: [phone]

## 2021-07-10 NOTE — Progress Notes (Signed)
SITE AREA: left femoral/groin  SITE PRIOR TO REMOVAL:  LEVEL 0  PRESSURE APPLIED FOR: approximately 20 minutes  MANUAL: yes  PATIENT STATUS DURING PULL: stable  POST PULL SITE:  LEVEL 0  POST PULL INSTRUCTIONS GIVEN: yes  POST PULL PULSES PRESENT: left pedal dopplerable, right post tibial dopplerable  DRESSING APPLIED: gauze with tegaderm  BEDREST BEGINS @ 1437  COMMENTS:

## 2021-07-10 NOTE — Progress Notes (Signed)
Dr. Darrick Penna notified of pt c/o 10/10 lower mid chest pain, (pressure), orders obtained

## 2021-07-10 NOTE — Progress Notes (Signed)
Pt brought to cath lab holding [place], upon receiving pt and report, pt with mid lower chest pain at 10/10, reported to nurse from [facility] lab as indigestion from plavix, IV pepcid given, and increased BP treated, see MAR, safety maintained

## 2021-07-13 ENCOUNTER — Encounter (HOSPITAL_COMMUNITY): Payer: Self-pay | Admitting: Vascular Surgery

## 2021-07-13 LAB — POCT ACTIVATED CLOTTING TIME: Activated Clotting Time: 202 seconds

## 2021-07-14 ENCOUNTER — Encounter: Payer: Self-pay | Admitting: Physician Assistant

## 2021-07-14 ENCOUNTER — Other Ambulatory Visit: Payer: Self-pay

## 2021-07-14 ENCOUNTER — Ambulatory Visit (HOSPITAL_COMMUNITY)
Admission: RE | Admit: 2021-07-14 | Discharge: 2021-07-14 | Disposition: A | Discharge status: Home / Self Care | Payer: Medicare Other | Source: Ambulatory Visit | Attending: Vascular Surgery | Admitting: Vascular Surgery

## 2021-07-14 ENCOUNTER — Ambulatory Visit (INDEPENDENT_AMBULATORY_CARE_PROVIDER_SITE_OTHER): Payer: Medicare Other | Admitting: Physician Assistant

## 2021-07-14 VITALS — BP 134/71 | HR 71 | Temp 97.7°F | Ht <= 58 in | Wt 115.2 lb

## 2021-07-14 DIAGNOSIS — I739 Peripheral vascular disease, unspecified: Secondary | ICD-10-CM | POA: Diagnosis not present

## 2021-07-14 NOTE — Progress Notes (Signed)
Office Note     CC:  follow up Requesting Provider:  Lupita RaiderShaw, Kimberlee, MD  HPI: Sydney Gross is a 87 y.o. ([birth year]) female who presents with concerns of small knot in left groin. She is s/p Abdominal aortogram with BLE runoff with left common and left external iliac artery stenting on 07/10/21 by Dr. Darrick PennaFields. This was performed due to left lower extremity claudication. She says that her legs have been feeling great. Her left leg pain is completely resolved. This morning at 4 am she says she was lying in bed and just happened to run her hand across her groin and felt a small knot there. It scared her so she called her daughter. She says she never felt any pain. No bleeding or skin changes. Her left leg feels fine but she just wanted to come in to have it evaluated  The pt is not on a statin for cholesterol management. Does not tolerate statins. Managed on Zetia and Repatha The pt is on a daily aspirin.   Other AC:  Plavix The pt is on BB, ACE/ HCTZ  for hypertension.   The pt is not diabetic.   Tobacco hx:  never  Past Medical History:  Diagnosis Date   Allergic rhinitis    Arthritis    "maybe a little in my knees and back" (07/17/2018)   Chronic lower back pain    Colitis 12/2013   "never had this before"   COPD (chronic obstructive pulmonary disease) (HCC)    "questionable/Dr. Katrinka BlazingSmith" (07/17/2018)   Coronary artery disease    Exertional shortness of breath    "off and on" (07/17/2018)   Family history of anesthesia complication    "mother; think they gave her too much; c/o pain after OR; gave her more RX; they had to bring her back real quick"   H/O calcium pyrophosphate deposition disease (CPPD)    Heart murmur    Hiatal hernia    Hyperlipidemia    Hypertension    Numbness in feet    On home oxygen therapy    "I've used it 2 times in 2 yrs" (07/17/2018)   PAD (peripheral artery disease) (HCC)    Pneumonia    "1st grade & 4th grade" (07/17/2018)   Proteinuria    Shingles     Stenosis of tear duct     Past Surgical History:  Procedure Laterality Date   ABDOMINAL AORTOGRAM W/LOWER EXTREMITY N/A 07/10/2021   Procedure: ABDOMINAL AORTOGRAM W/LOWER EXTREMITY;  Surgeon: Sherren KernsFields, Charles E, MD;  Location: [facility];  Service: Cardiovascular;  Laterality: N/A;   ABDOMINAL HYSTERECTOMY  1960's   APPENDECTOMY     BACK SURGERY     BIOPSY  04/20/2021   Procedure: BIOPSY;  Surgeon: Jeani HawkingHung, Patrick, MD;  Location: [facility];  Service: Endoscopy;;   BLEPHAROPLASTY Bilateral 05/2017   CARDIAC CATHETERIZATION     CATARACT EXTRACTION, BILATERAL Bilateral    CESAREAN SECTION  1957; 1960   CORONARY STENT INTERVENTION N/A 07/17/2018   Procedure: CORONARY STENT INTERVENTION;  Surgeon: Lyn RecordsSmith, Henry W, MD;  Location: [facility];  Service: Cardiovascular;  Laterality: N/A;   DACROCYSTORHINOSTOMY W/ JONES TUBE Bilateral 05/2017   "put tear duct in my eyes"   ESOPHAGOGASTRODUODENOSCOPY (EGD) WITH PROPOFOL N/A 04/20/2021   Procedure: ESOPHAGOGASTRODUODENOSCOPY (EGD) WITH PROPOFOL;  Surgeon: Jeani HawkingHung, Patrick, MD;  Location: [facility];  Service: Endoscopy;  Laterality: N/A;   EYE SURGERY     LAPAROSCOPIC CHOLECYSTECTOMY  ~ 2009   LUMBAR  DISC SURGERY  ?2004   PERIPHERAL VASCULAR INTERVENTION Left 07/10/2021   Procedure: PERIPHERAL VASCULAR INTERVENTION;  Surgeon: Sherren Kerns, MD;  Location: [facility];  Service: Cardiovascular;  Laterality: Left;  common, external iliac arteries   RIGHT/LEFT HEART CATH AND CORONARY ANGIOGRAPHY N/A 07/17/2018   Procedure: RIGHT/LEFT HEART CATH AND CORONARY ANGIOGRAPHY;  Surgeon: Lyn Records, MD;  Location: [facility];  Service: Cardiovascular;  Laterality: N/A;   TONSILLECTOMY AND ADENOIDECTOMY  ?1940's    Social History   Socioeconomic History   Marital status: Married    Spouse name: Delton See   Number of children: Not on file   Years of education: 12   Highest education level: High school graduate  Occupational  History   Occupation: Retired  Tobacco Use   Smoking status: Never   Smokeless tobacco: Never  Vaping Use   Vaping Use: Never used  Substance and Sexual Activity   Alcohol use: Yes    Alcohol/week: 4.0 standard drinks    Types: 4 Glasses of wine per week    Comment: glass of wine every other night   Drug use: Never   Sexual activity: Not Currently  Other Topics Concern   Not on file  Social History Narrative   Not on file   Social Determinants of Health   Financial Resource Strain: Not on file  Food Insecurity: Not on file  Transportation Needs: Not on file  Physical Activity: Not on file  Stress: Not on file  Social Connections: Not on file  Intimate Partner Violence: Not on file    Family History  Problem Relation Age of Onset   Emphysema Father 21   CVA Mother    Cancer Brother        ? type    Current Outpatient Medications  Medication Sig Dispense Refill   acetaminophen (TYLENOL) 650 MG CR tablet Take 1,300 mg by mouth every 8 (eight) hours as needed for pain.     aspirin EC 81 MG tablet Take 1 tablet (81 mg total) by mouth daily. Swallow whole. 90 tablet 3   Budeson-Glycopyrrol-Formoterol (BREZTRI AEROSPHERE) 160-9-4.8 MCG/ACT AERO Inhale 2 puffs into the lungs in the morning and at bedtime.     clopidogrel (PLAVIX) 75 MG tablet Take 1 tablet (75 mg total) by mouth daily. 30 tablet 11   Denosumab (PROLIA [place]) Inject 1 Dose as directed every 6 (six) months. Use as directed.     diphenhydramine-acetaminophen (TYLENOL PM) 25-500 MG TABS tablet Take 1 tablet by mouth at bedtime.     Evolocumab (REPATHA SURECLICK) 140 MG/ML SOAJ INJECT 1 PEN INTO THE SKIN EVERY 14 DAYS (Patient taking differently: Inject 140 mg as directed every 14 (fourteen) days.) 6 mL 3   ezetimibe (ZETIA) 10 MG tablet Take 1 tablet (10 mg total) by mouth daily. 30 tablet 5   lisinopril-hydrochlorothiazide (ZESTORETIC) 20-25 MG tablet Take 0.5 tablets by mouth at bedtime. 45 tablet 3   Magnesium  Cl-Calcium Carbonate (SLOW-MAG PO) Take 1 tablet by mouth 2 (two) times daily.     metoprolol succinate (TOPROL-XL) 25 MG 24 hr tablet Take 1/2 (one-half) tablet by mouth once daily (Patient taking differently: Take 12.5 mg by mouth daily.) 45 tablet 0   nitroGLYCERIN (NITROSTAT) 0.4 MG SL tablet Place 0.4 mg under the tongue every 5 (five) minutes as needed for chest pain.     pantoprazole (PROTONIX) 40 MG tablet Take 1 tablet (40 mg total) by mouth 2 (two) times daily  for 30 days, THEN 1 tablet (40 mg total) daily. (Patient taking differently: Take 40 mg by mouth once daily) 120 tablet 0   spironolactone (ALDACTONE) 25 MG tablet Take 25 mg by mouth daily.     No current facility-administered medications for this visit.    Allergies  Allergen Reactions   Brilinta [Ticagrelor] Shortness Of Breath   Codeine Nausea And Vomiting   Darvon [Propoxyphene] Nausea Only   Lipitor [Atorvastatin] Other (See Comments)    MYALGIAS   Rosuvastatin Other (See Comments)    MYALGIAS    Simvastatin Other (See Comments)    MYALGIAS    Nexlizet [Bempedoic Acid-Ezetimibe]     Muscle aches (tolerates Zetia just fine, allergy is with bempedoic acid component)   Tramadol Hcl Nausea Only    nausea (08/2019)     REVIEW OF SYSTEMS:  [X]  denotes positive finding, [ ]  denotes negative finding Cardiac  Comments:  Chest pain or chest pressure:    Shortness of breath upon exertion:    Short of breath when lying flat:    Irregular heart rhythm:        Vascular    Pain in calf, thigh, or hip brought on by ambulation:    Pain in feet at night that wakes you up from your sleep:     Blood clot in your veins:    Leg swelling:         Pulmonary    Oxygen at home:    Productive cough:     Wheezing:         Neurologic    Sudden weakness in arms or legs:     Sudden numbness in arms or legs:     Sudden onset of difficulty speaking or slurred speech:    Temporary loss of vision in one eye:     Problems with  dizziness:         Gastrointestinal    Blood in stool:     Vomited blood:         Genitourinary    Burning when urinating:     Blood in urine:        Psychiatric    Major depression:         Hematologic    Bleeding problems:    Problems with blood clotting too easily:        Skin    Rashes or ulcers:        Constitutional    Fever or chills:      PHYSICAL EXAMINATION:  Vitals:   07/14/21 1024  BP: 134/71  Pulse: 71  Temp: 97.7 F (36.5 C)  TempSrc: Oral  SpO2: 100%  Weight: 115 lb 3.2 oz (52.3 kg)  Height: 4\' 9"  (1.448 m)    General:  WDWN in NAD; vital signs documented above Gait: Normal HENT: WNL, normocephalic Pulmonary: normal non-labored breathing  Cardiac: regular HR, without  Murmurs without carotid bruit Abdomen: distended, soft, NT, no masses Vascular Exam/Pulses:  Right Left  Radial 2+ (normal) 2+ (normal)  Femoral 2+ (normal) 2+ (normal)  Popliteal Not palpable Not palpable  DP Not palpable Not palpable  PT Not palpable Not palpable   Extremities: without ischemic changes, without Gangrene , without cellulitis; without open wounds; small knot in left groin. Possibly small pseudo that thrombosed on its own.Ecchymosis present but unchanged from discharge Musculoskeletal: no muscle wasting or atrophy  Neurologic: A&O X 3;  No focal weakness or paresthesias are detected Psychiatric:  The pt has  Normal affect.   Non-Invasive Vascular Imaging:   07/14/21 +-----------+----------+-------------+------+--------------------+  Left DuplexPSV (cm/s)  Waveform   Plaque     Comment(s)       +-----------+----------+-------------+------+--------------------+  CFA           174      biphasic                               +-----------+----------+-------------+------+--------------------+  PFA           101      biphasic                               +-----------+----------+-------------+------+--------------------+  Prox SFA       40     pre occlusive      known SFA occlusion.  +-----------+----------+-------------+------+--------------------+   Left Vein comments: Patent CFV, PFV, FV with normal phasic waveform.  Summary: No evidence of pseudoaneurysm, AVF or DVT    ASSESSMENT/PLAN:: 87 y.o. female here for follow up s/p Abdominal aortogram with BLE runoff with left common and left external iliac artery stenting on 07/10/21 by Dr. Darrick Penna. Concerned after feeling small knot in her left groin this morning. No pain in left leg. LLE claudication pain has completely resolved following intervention.Resolving hematoma in left groin unchanged from discharge.  -Duplex today does not show any pseudoaneurysm, AVF or DVT - Continue Aspirin, Statin, Zetia and Repatha - She will keep her appointment with Dr. Chestine Spore on 08/18/21 for follow up    Graceann Congress, PA-C [facility] [phone]  Clinic MD:   [facility]

## 2021-07-15 ENCOUNTER — Encounter (HOSPITAL_COMMUNITY): Payer: Self-pay | Admitting: Vascular Surgery

## 2021-07-27 ENCOUNTER — Other Ambulatory Visit: Payer: Self-pay | Admitting: Interventional Cardiology

## 2021-08-13 ENCOUNTER — Other Ambulatory Visit: Payer: Self-pay | Admitting: Interventional Cardiology

## 2021-08-13 ENCOUNTER — Encounter: Payer: Self-pay | Admitting: Physician Assistant

## 2021-08-13 NOTE — Progress Notes (Addendum)
Cardiology Office Note    Date:  08/14/2021   ID:  SHAVONTE ZHAO, DOB Nov 12, 1930, MRN [id number]  PCP:  Mayra Neer, MD  Cardiologist:  Sinclair Grooms, MD  Electrophysiologist:  None   Chief Complaint: f/u CAD  History of Present Illness:   Sydney Gross is a 87 y.o. female with history of CAD s/p DES to LAD and diagonal 2019, COPD, PAD, ectatic abdominal aorta, HTN, HLD (prior LDL 363!), upper GIB, allergic rhinitis, moderate pulm HTN by echo 2018, arthritis who presents for follow-up. Prior echo in 2018 showed normal EF, grade 2 DD, moderate LAE, moderate TR, moderate pulmonary HTN. Dilated aortic root mentioned in remote notes but was normal caliber by 2018 echo. She was previously evaluated by our team with abnormal stress test in 2019 leading to cardiac cath with results outlined below, ultimately PCI to LAD and diagonal. She was admitted 04/2021 with symptomatic anemia due to upper GIB and non-bleeding stomach ulcer and irritation of the duodenum. Plavix and NSAIDS were discontinued and she was treated with Protonix. She saw Laurann Montana back in follow-up 05/2021 and was doing well. Zetia was resumed (also on PCSKi 2/2 statin intolerance) and she was to remain off Plavix. However, she also has concomitant PAD followed by [facility] with last aortogram 07/2021 as below. She had an episode of transient chest pain post-operatively and troponin was negative. EKG confounded by baseline wander. Per their notes, Dr. Oneida Alar placed her back on Plavix and ASA to continue Plavix at least 6 weeks, preferably lifetime as tolerated per his report.  She presents back for follow-up with her daughter today. She feels like she has a whole new life since having her PV procedure as she now can go to the store or walk longer distances without claudication. However, correlating with this increase in activity, she has noticed worsening exertional dyspnea (like walking the length of the office today). In the  past she previously even had to use a wheelchair. She does not have any increase in orthopnea or shortness of breath with her usual ADLs. No exertional angina whatsoever. Volume status looks stable and she clinically appears great at rest.   Labwork independently reviewed: 07/2021 troponin neg x1, Istat labs with K 5.2, Cr 1.00, Hgb 11.9  05/2021 K 5.0, Cr 0.99, Plt 331, Hgb 10.1,AST/ALT OK, LDL 179 by [facility] 02/2020 LDL 110 2016 TSH wnl  Past Medical History:  Diagnosis Date   Allergic rhinitis    Arthritis    "maybe a little in my knees and back" (07/17/2018)   Chronic lower back pain    Colitis 12/2013   "never had this before"   COPD (chronic obstructive pulmonary disease) ([place])    "questionable/Dr. Tamala Julian" (07/17/2018)   Coronary artery disease    Family history of anesthesia complication    "mother; think they gave her too much; c/o pain after OR; gave her more RX; they had to bring her back real quick"   GI bleed    H/O calcium pyrophosphate deposition disease (CPPD)    Heart murmur    Hiatal hernia    Hyperlipidemia    Hypertension    Numbness in feet    On home oxygen therapy    "I've used it 2 times in 2 yrs" (07/17/2018)   PAD (peripheral artery disease) ([facility])    Proteinuria    Pulmonary hypertension ([place])    Shingles    Stenosis of tear duct     Past Surgical  History:  Procedure Laterality Date   ABDOMINAL AORTOGRAM W/LOWER EXTREMITY N/A 07/10/2021   Procedure: ABDOMINAL AORTOGRAM W/LOWER EXTREMITY;  Surgeon: Elam Dutch, MD;  Location: [facility];  Service: Cardiovascular;  Laterality: N/A;   ABDOMINAL HYSTERECTOMY  1960's   APPENDECTOMY     BACK SURGERY     BIOPSY  04/20/2021   Procedure: BIOPSY;  Surgeon: Carol Ada, MD;  Location: [facility];  Service: Endoscopy;;   BLEPHAROPLASTY Bilateral 05/2017   CARDIAC CATHETERIZATION     CATARACT EXTRACTION, BILATERAL Bilateral    CESAREAN SECTION  1957; 1960   CORONARY STENT INTERVENTION N/A 07/17/2018    Procedure: CORONARY STENT INTERVENTION;  Surgeon: Belva Crome, MD;  Location: [facility];  Service: Cardiovascular;  Laterality: N/A;   DACROCYSTORHINOSTOMY W/ JONES TUBE Bilateral 05/2017   "put tear duct in my eyes"   ESOPHAGOGASTRODUODENOSCOPY (EGD) WITH PROPOFOL N/A 04/20/2021   Procedure: ESOPHAGOGASTRODUODENOSCOPY (EGD) WITH PROPOFOL;  Surgeon: Carol Ada, MD;  Location: [facility];  Service: Endoscopy;  Laterality: N/A;   EYE SURGERY     LAPAROSCOPIC CHOLECYSTECTOMY  ~ 2009   LUMBAR DISC SURGERY  ?2004   PERIPHERAL VASCULAR INTERVENTION Left 07/10/2021   Procedure: PERIPHERAL VASCULAR INTERVENTION;  Surgeon: Elam Dutch, MD;  Location: [facility];  Service: Cardiovascular;  Laterality: Left;  common, external iliac arteries   RIGHT/LEFT HEART CATH AND CORONARY ANGIOGRAPHY N/A 07/17/2018   Procedure: RIGHT/LEFT HEART CATH AND CORONARY ANGIOGRAPHY;  Surgeon: Belva Crome, MD;  Location: [facility];  Service: Cardiovascular;  Laterality: N/A;   TONSILLECTOMY AND ADENOIDECTOMY  ?1940's    Current Medications: Current Meds  Medication Sig   acetaminophen (TYLENOL) 650 MG CR tablet Take 1,300 mg by mouth every 8 (eight) hours as needed for pain.   aspirin EC 81 MG tablet Take 1 tablet (81 mg total) by mouth daily. Swallow whole.   Budeson-Glycopyrrol-Formoterol (BREZTRI AEROSPHERE) 160-9-4.8 MCG/ACT AERO Inhale 2 puffs into the lungs in the morning and at bedtime.   Denosumab (PROLIA [place]) Inject 1 Dose as directed every 6 (six) months. Use as directed.   diphenhydramine-acetaminophen (TYLENOL PM) 25-500 MG TABS tablet Take 1 tablet by mouth at bedtime.   Evolocumab (REPATHA SURECLICK) 841 MG/ML SOAJ INJECT 1 PEN INTO THE SKIN EVERY 14 DAYS   ezetimibe (ZETIA) 10 MG tablet Take 1 tablet (10 mg total) by mouth daily.   lisinopril-hydrochlorothiazide (ZESTORETIC) 20-25 MG tablet Take 0.5 tablets by mouth at bedtime.   Magnesium Cl-Calcium Carbonate  (SLOW-MAG PO) Take 1 tablet by mouth 2 (two) times daily.   metoprolol succinate (TOPROL-XL) 25 MG 24 hr tablet Take 1/2 (one-half) tablet by mouth once daily   nitroGLYCERIN (NITROSTAT) 0.4 MG SL tablet Place 0.4 mg under the tongue every 5 (five) minutes as needed for chest pain.   pantoprazole (PROTONIX) 40 MG tablet Take 40 mg by mouth daily.   spironolactone (ALDACTONE) 25 MG tablet Take 1/2 (one-half) tablet by mouth once daily      Allergies:   Brilinta [ticagrelor], Codeine, Darvon [propoxyphene], Lipitor [atorvastatin], Rosuvastatin, Simvastatin, Nexlizet [bempedoic acid-ezetimibe], and Tramadol hcl   Social History   Socioeconomic History   Marital status: Married    Spouse name: Meda Coffee   Number of children: Not on file   Years of education: 12   Highest education level: High school graduate  Occupational History   Occupation: Retired  Tobacco Use   Smoking status: Never   Smokeless tobacco: Never  Vaping  Use   Vaping Use: Never used  Substance and Sexual Activity   Alcohol use: Yes    Alcohol/week: 4.0 standard drinks    Types: 4 Glasses of wine per week    Comment: glass of wine every other night   Drug use: Never   Sexual activity: Not Currently  Other Topics Concern   Not on file  Social History Narrative   Not on file   Social Determinants of Health   Financial Resource Strain: Not on file  Food Insecurity: Not on file  Transportation Needs: Not on file  Physical Activity: Not on file  Stress: Not on file  Social Connections: Not on file     Family History:  The patient's family history includes CVA in her mother; Cancer in her brother; Emphysema (age of onset: 6) in her father.  ROS:   Please see the history of present illness.  All other systems are reviewed and otherwise negative.    EKGs/Labs/Other Studies Reviewed:    Studies reviewed are outlined and summarized above. Reports included below if pertinent.  PV angio 07/2021 Operative  management: The patient now has widely patent left iliac system.  She has a flush occlusion of the superficial femoral artery bilaterally.  In light of her age we will see how her symptoms progress over the next 4 to 6 weeks and see if she has had improvement of her walking distance.  Since she is 87 years old unless she has fairly debilitating symptoms I would be reluctant to proceed with a bypass operation for either leg.  I believe that she will be significantly improved by today's procedure.  She was placed on Plavix and aspirin.  The Plavix should be continued for at least 6 weeks.  I would prefer to continue the Plavix lifetime as long as she tolerates this.   Ruta Hinds, MD [facility] Office: [phone]    Cath 07/2018 Coronary artery disease with high-grade obstruction in the mid LAD and proximal to mid first diagonal. Normal left main. Normal circumflex coronary artery. Dominant RCA with luminal irregularities up to 40% distally. Right heart pressures Ventricular systolic function and LVEDP (14 mmHg). Successful PCI and stent implantation in both the mid LAD and proximal to mid first diagonal.   The LAD 90% stenosis was reduced to 0% with TIMI grade III flow using a 2.5 x 28 Synergy postdilated to 2.75 mm in diameter.  TIMI grade III flow pre-and post stenting. The first diagonal 90% stenosis was reduced to 0% using by 2.25 mm Synergy deployed at 14 atm which is not effective stent size of 2.32 mm.  TIMI grade III flow noted pre-and post PCI.   RECOMMENDATIONS:   Continue aggressive risk factor modification: PCSK9 therapy (Repatha), moderate aerobic physical activity, blood pressure control less than 140/90 mmHg, and surveillance of glycemic control. Aspirin and Brilinta x1 month.  Switch to aspirin and Plavix thereafter.  Total duration of dual antiplatelet therapy 6 months at which time Plavix can be dropped. Discharge in a.m.   Recommend  uninterrupted dual antiplatelet therapy with Aspirin 79m daily and Ticagrelor 937mtwice daily for a minimum of 6 months (stable ischemic heart disease - Class I recommendation).   2D echo 04/2017 - Left ventricle: The cavity size was normal. Wall thickness was    increased in a pattern of mild LVH. Systolic function was    vigorous. The estimated ejection fraction was in the range of 65%  to 70%. Wall motion was normal; there were no regional wall    motion abnormalities. Features are consistent with a pseudonormal    left ventricular filling pattern, with concomitant abnormal    relaxation and increased filling pressure (grade 2 diastolic    dysfunction).  - Left atrium: The atrium was moderately dilated.  - Tricuspid valve: There was moderate regurgitation.  - Pulmonary arteries: Systolic pressure was moderately increased.    PA peak pressure: 41 mm Hg (S).    EKG:  EKG is ordered today, personally reviewed, demonstrating NSR 69bpm, subtle ST upsloping inferiorly and V5-V6 similar to back to 2019  Recent Labs: 04/21/2021: Magnesium 1.7 05/11/2021: ALT 12; Platelets 331 07/10/2021: BUN 29; Creatinine, Ser 1.00; Hemoglobin 11.9; Potassium 5.2; Sodium 140  Recent Lipid Panel    Component Value Date/Time   CHOL 186 03/05/2020 0920   TRIG 188 (H) 03/05/2020 0920   HDL 43 03/05/2020 0920   CHOLHDL 4.3 03/05/2020 0920   LDLCALC 110 (H) 03/05/2020 0920    PHYSICAL EXAM:    VS:  BP 134/72   Pulse 83   Ht '4\' 9"'  (1.448 m)   Wt 114 lb 12.8 oz (52.1 kg)   SpO2 97%   BMI 24.84 kg/m   BMI: Body mass index is 24.84 kg/m.  GEN: Well nourished, well developed cheerful, engaging female in no acute distress HEENT: normocephalic, atraumatic Neck: no JVD, carotid bruits, or masses Cardiac: RRR; no murmurs, rubs, or gallops, no edema  Respiratory:  clear to auscultation bilaterally, normal work of breathing GI: soft, nontender, nondistended, + BS MS: no deformity or atrophy Skin: warm  and dry, no rash Neuro:  Alert and Oriented x 3, Strength and sensation are intact, follows commands Psych: euthymic mood, full affect  Wt Readings from Last 3 Encounters:  08/14/21 114 lb 12.8 oz (52.1 kg)  07/14/21 115 lb 3.2 oz (52.3 kg)  07/10/21 112 lb (50.8 kg)     ASSESSMENT & PLAN:   1. Dyspnea on exertion - she has had chronic DOE for years related to COPD but has noticed an increase correlating with her recent PV procedure. It is not clear if this represents pathology versus the experience of deconditioning as she engages in the physical activity she was previously unable to do because of her PV issues. First we will undertake a stat CBC today to ensure no recurrent GIB (signed out to on-call APP to be aware). Also had brief chest pain post-operatively from PV procedure which she feels was related to a specific medication given at that time. She is otherwise not describing any recurrent chest pain symptoms since then and has no angina with exertion. She is not tachycardic, tachypneic or hypoxic. She looks great at rest. With recent GI bleeding issues, would be hesitant to rush into invasive ischemic evaluation for this. We will start with an echocardiogram to re-evaluate her LVEF and pulmonary HTN first. We'll also get a BNP and BMET with labs. Will also plan set up for a 2V CXR as long as labs do not show recurrent anemia (see phone note for Monday to call to set up).  2. CAD - has recently only been on ASA for this from cardiac standpoint, but [facility] has recommended DAPT for at least 6 weeks. The patient states she was unsure of what the plan was for her Plavix so hasn't been taking this. She thought she had been told to stop it again. This was previously stopped from a cardiac standpoint  but I did relay to her the recommendations that Dr. Oneida Alar outlined in his recent note and asked her to call the vascular surgery team to clarify their ongoing plan. In the meantime she has tablets at home  she will resume. We're checking surveillance CBC again as above. Continue Toprol. No wheezing. See below regarding lipids.  3. Essential HTN - BP upper limits of normal but given advanced age I am satisfied with this reading today. We can revisit medication optimization if LVEF is down.  4. Hyperlipidemia - at first I was surprised to learn that her LDL was in the 190s even with PCSK9i. However, then I realized that her LDL had been as high as 363 in the past likely suggesting FH. (Her daughter also has this condition and is managed with medication.) Zetia was restarted earlier this year. We'll plan to recheck her liver/lipid profile (fasting) when she returns for her echo as she had eaten today. PharmD notes previously indicated they did not suspect she would reach goal of <70 but she was previously able to attain levels around 110.  Will also get TSH to exclude any hypothyroidism contributing to HLD.  5. Moderate pulm HTN - will f/u echocardiogram to assess PA pressure. She is not hypoxic today. Suspect this has been due to longstanding COPD but we'll see what echo shows.  Disposition: F/u with me after above testing.  Medication Adjustments/Labs and Tests Ordered: Current medicines are reviewed at length with the patient today.  Concerns regarding medicines are outlined above. Medication changes, Labs and Tests ordered today are summarized above and listed in the Patient Instructions accessible in Encounters.   Signed, Charlie Pitter, PA-C  08/14/2021 4:09 PM    [facility] [address], [place], [place]  [address] Phone: [phone]; Fax: [phone]

## 2021-08-14 ENCOUNTER — Encounter: Payer: Self-pay | Admitting: Physician Assistant

## 2021-08-14 ENCOUNTER — Other Ambulatory Visit: Payer: Self-pay

## 2021-08-14 ENCOUNTER — Telehealth: Payer: Self-pay | Admitting: Home Health

## 2021-08-14 ENCOUNTER — Telehealth: Payer: Self-pay | Admitting: Physician Assistant

## 2021-08-14 ENCOUNTER — Ambulatory Visit: Payer: Medicare Other | Admitting: Physician Assistant

## 2021-08-14 VITALS — BP 134/72 | HR 83 | Ht <= 58 in | Wt 114.8 lb

## 2021-08-14 DIAGNOSIS — I272 Pulmonary hypertension, unspecified: Secondary | ICD-10-CM | POA: Diagnosis not present

## 2021-08-14 DIAGNOSIS — E785 Hyperlipidemia, unspecified: Secondary | ICD-10-CM

## 2021-08-14 DIAGNOSIS — I2510 Atherosclerotic heart disease of native coronary artery without angina pectoris: Secondary | ICD-10-CM

## 2021-08-14 DIAGNOSIS — I10 Essential (primary) hypertension: Secondary | ICD-10-CM | POA: Diagnosis not present

## 2021-08-14 DIAGNOSIS — R0600 Dyspnea, unspecified: Secondary | ICD-10-CM | POA: Diagnosis not present

## 2021-08-14 DIAGNOSIS — R0609 Other forms of dyspnea: Secondary | ICD-10-CM

## 2021-08-14 LAB — CBC
Hematocrit: 31.9 % — ABNORMAL LOW (ref 34.0–46.6)
Hemoglobin: 10.2 g/dL — ABNORMAL LOW (ref 11.1–15.9)
MCH: 27.9 pg (ref 26.6–33.0)
MCHC: 32 g/dL (ref 31.5–35.7)
MCV: 87 fL (ref 79–97)
Platelets: 280 10*3/uL (ref 150–450)
RBC: 3.65 x10E6/uL — ABNORMAL LOW (ref 3.77–5.28)
RDW: 15.8 % — ABNORMAL HIGH (ref 11.7–15.4)
WBC: 10.3 10*3/uL (ref 3.4–10.8)

## 2021-08-14 NOTE — Telephone Encounter (Signed)
Follow-up from today's OV:  Have signed out to on call APP to be aware of pending stat CBC. I also think it would be worthwhile for [name] to update her CXR given her DOE. Will route to [place] to get her set up for a 2V CXR. Thanks. Hosanna Betley PA-C

## 2021-08-14 NOTE — Patient Instructions (Addendum)
Medication Instructions:  Your physician recommends that you continue on your current medications as directed. Please refer to the Current Medication list given to you today.  *If you need a refill on your cardiac medications before your next appointment, please call your pharmacy*   Lab Work: TODAY:  STAT CBC, BMET, PRO BNP, & TSH  6-8 WEEKS:  FASTING LIPID & LFT   If you have labs (blood work) drawn today and your tests are completely normal, you will receive your results only by: MyChart Message (if you have MyChart) OR A paper copy in the mail If you have any lab test that is abnormal or we need to change your treatment, we will call you to review the results.   Testing/Procedures: Your physician has requested that you have an echocardiogram. Echocardiography is a painless test that uses sound waves to create images of your heart. It provides your doctor with information about the size and shape of your heart and how well your heart's chambers and valves are working. This procedure takes approximately one hour. There are no restrictions for this procedure.    Follow-Up: At [facility], you and your health needs are our priority.  As part of our continuing mission to provide you with exceptional heart care, we have created designated Provider Care Teams.  These Care Teams include your primary Cardiologist (physician) and Advanced Practice Providers (APPs -  Physician Assistants and Nurse Practitioners) who all work together to provide you with the care you need, when you need it.  We recommend signing up for the patient portal called "MyChart".  Sign up information is provided on this After Visit Summary.  MyChart is used to connect with patients for Virtual Visits (Telemedicine).  Patients are able to view lab/test results, encounter notes, upcoming appointments, etc.  Non-urgent messages can be sent to your provider as well.   To learn more about what you can do with MyChart, go to  [email or web address].    Your next appointment:   10/05/2021 ARRIVE AT 11:30  The format for your next appointment:   In Person  Provider:   You may see Lesleigh Noe, MD or one of the following Advanced Practice Providers on your designated Care Team:   Nada Boozer,    Other Instructions Contact Dr. Evelina Dun office about the Plavix.

## 2021-08-14 NOTE — Telephone Encounter (Signed)
Received sign out from Dayna to look out for CBC , which has not been resulted yet at 8pm currently, follow per office.

## 2021-08-15 LAB — BASIC METABOLIC PANEL
BUN/Creatinine Ratio: 24 (ref 12–28)
BUN: 19 mg/dL (ref 8–27)
CO2: 19 mmol/L — ABNORMAL LOW (ref 20–29)
Calcium: 9.6 mg/dL (ref 8.7–10.3)
Chloride: 106 mmol/L (ref 96–106)
Creatinine, Ser: 0.78 mg/dL (ref 0.57–1.00)
Glucose: 103 mg/dL — ABNORMAL HIGH (ref 65–99)
Potassium: 4.5 mmol/L (ref 3.5–5.2)
Sodium: 142 mmol/L (ref 134–144)
eGFR: 73 mL/min/{1.73_m2} (ref >59)

## 2021-08-15 LAB — TSH: TSH: 1.75 u[IU]/mL (ref 0.450–4.500)

## 2021-08-15 LAB — PRO B NATRIURETIC PEPTIDE: NT-Pro BNP: 645 pg/mL (ref 0–738)

## 2021-08-17 ENCOUNTER — Telehealth: Payer: Self-pay | Admitting: *Deleted

## 2021-08-17 ENCOUNTER — Ambulatory Visit
Admission: RE | Admit: 2021-08-17 | Discharge: 2021-08-17 | Disposition: A | Discharge status: Home / Self Care | Payer: Medicare Other | Source: Ambulatory Visit | Attending: Physician Assistant | Admitting: Physician Assistant

## 2021-08-17 DIAGNOSIS — R0600 Dyspnea, unspecified: Secondary | ICD-10-CM

## 2021-08-17 DIAGNOSIS — R0609 Other forms of dyspnea: Secondary | ICD-10-CM

## 2021-08-17 DIAGNOSIS — R0602 Shortness of breath: Secondary | ICD-10-CM | POA: Diagnosis not present

## 2021-08-17 NOTE — Telephone Encounter (Signed)
-----   Message from Laurann Montana, [place] sent at 08/17/2021  7:55 AM EDT ----- Please let patient know labs overall stable. Hemoglobin is a little on the lower side but I think overall stable from prior. (Document: Last "istat value" was 11.9 in 07/2021 but istats are rarely "apples to oranges" - by comparison, her prior CBC Hgb was 10.1.)  Please see phone note about setting up for CXR. Otherwise would recommend to f/u primary care to see about optimizing COPD and following anemia further, and keep plan for echo. Dayna Dunn PA-C

## 2021-08-17 NOTE — Progress Notes (Signed)
Pt has been made aware of normal result and verbalized understanding.  jw

## 2021-08-17 NOTE — Telephone Encounter (Signed)
Taken care of in lab result phone note.

## 2021-08-18 ENCOUNTER — Ambulatory Visit (HOSPITAL_COMMUNITY)
Admission: RE | Admit: 2021-08-18 | Discharge: 2021-08-18 | Disposition: A | Discharge status: Home / Self Care | Payer: Medicare Other | Source: Ambulatory Visit | Attending: Vascular Surgery | Admitting: Vascular Surgery

## 2021-08-18 ENCOUNTER — Ambulatory Visit: Payer: Medicare Other | Admitting: Vascular Surgery

## 2021-08-18 ENCOUNTER — Encounter: Payer: Self-pay | Admitting: Vascular Surgery

## 2021-08-18 ENCOUNTER — Other Ambulatory Visit: Payer: Self-pay

## 2021-08-18 VITALS — BP 150/79 | HR 60 | Temp 97.6°F | Resp 14 | Ht 59.5 in | Wt 112.0 lb

## 2021-08-18 DIAGNOSIS — I739 Peripheral vascular disease, unspecified: Secondary | ICD-10-CM | POA: Insufficient documentation

## 2021-08-18 NOTE — Progress Notes (Signed)
Patient name: Sydney Gross MRN: [id number] DOB: 1930/06/19 Sex: female  REASON FOR VISIT: Follow-up after left iliac stenting for claudication  HPI: Sydney Gross is a 87 y.o. female with multiple medical problems including COPD that presents for follow-up after recent intervention on the left iliac artery.  She has previously been under the care of Dr. Darrick Penna who just retired from our practice.  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  She has known flush SFA occlusion bilateral as well.  She states her leg is significantly improved.  She has no pain with walking.  She states she can walk as far as she wants until her lungs limit her ability to walk.  She completed 6 weeks of Plavix and is now on aspirin.  Past Medical History:  Diagnosis Date   Allergic rhinitis    Arthritis    "maybe a little in my knees and back" (07/17/2018)   Chronic lower back pain    Colitis 12/2013   "never had this before"   COPD (chronic obstructive pulmonary disease) (HCC)    "questionable/Dr. Katrinka Blazing" (07/17/2018)   Coronary artery disease    Family history of anesthesia complication    "mother; think they gave her too much; c/o pain after OR; gave her more RX; they had to bring her back real quick"   GI bleed    H/O calcium pyrophosphate deposition disease (CPPD)    Heart murmur    Hiatal hernia    Hyperlipidemia    Hypertension    Numbness in feet    On home oxygen therapy    "I've used it 2 times in 2 yrs" (07/17/2018)   PAD (peripheral artery disease) (HCC)    Proteinuria    Pulmonary hypertension (HCC)    Shingles    Stenosis of tear duct     Past Surgical History:  Procedure Laterality Date   ABDOMINAL AORTOGRAM W/LOWER EXTREMITY N/A 07/10/2021   Procedure: ABDOMINAL AORTOGRAM W/LOWER EXTREMITY;  Surgeon: Sherren Kerns, MD;  Location: [facility];  Service: Cardiovascular;   Laterality: N/A;   ABDOMINAL HYSTERECTOMY  1960's   APPENDECTOMY     BACK SURGERY     BIOPSY  04/20/2021   Procedure: BIOPSY;  Surgeon: Jeani Hawking, MD;  Location: [facility];  Service: Endoscopy;;   BLEPHAROPLASTY Bilateral 05/2017   CARDIAC CATHETERIZATION     CATARACT EXTRACTION, BILATERAL Bilateral    CESAREAN SECTION  1957; 1960   CORONARY STENT INTERVENTION N/A 07/17/2018   Procedure: CORONARY STENT INTERVENTION;  Surgeon: Lyn Records, MD;  Location: [facility];  Service: Cardiovascular;  Laterality: N/A;   DACROCYSTORHINOSTOMY W/ JONES TUBE Bilateral 05/2017   "put tear duct in my eyes"   ESOPHAGOGASTRODUODENOSCOPY (EGD) WITH PROPOFOL N/A 04/20/2021   Procedure: ESOPHAGOGASTRODUODENOSCOPY (EGD) WITH PROPOFOL;  Surgeon: Jeani Hawking, MD;  Location: [facility];  Service: Endoscopy;  Laterality: N/A;   EYE SURGERY     LAPAROSCOPIC CHOLECYSTECTOMY  ~ 2009   LUMBAR DISC SURGERY  ?2004   PERIPHERAL VASCULAR INTERVENTION Left 07/10/2021   Procedure: PERIPHERAL VASCULAR INTERVENTION;  Surgeon: Sherren Kerns, MD;  Location: [facility];  Service: Cardiovascular;  Laterality: Left;  common, external iliac arteries   RIGHT/LEFT HEART CATH AND CORONARY ANGIOGRAPHY N/A 07/17/2018   Procedure: RIGHT/LEFT HEART CATH AND CORONARY ANGIOGRAPHY;  Surgeon: Lyn Records, MD;  Location: [facility];  Service: Cardiovascular;  Laterality: N/A;   TONSILLECTOMY AND ADENOIDECTOMY  ?1940's    Family History  Problem Relation Age of Onset   Emphysema Father 47   CVA Mother    Cancer Brother        ? type    SOCIAL HISTORY: Social History   Tobacco Use   Smoking status: Never   Smokeless tobacco: Never  Substance Use Topics   Alcohol use: Yes    Alcohol/week: 4.0 standard drinks    Types: 4 Glasses of wine per week    Comment: glass of wine every other night    Allergies  Allergen Reactions   Brilinta [Ticagrelor] Shortness Of Breath   Codeine Nausea And  Vomiting   Darvon [Propoxyphene] Nausea Only   Lipitor [Atorvastatin] Other (See Comments)    MYALGIAS   Rosuvastatin Other (See Comments)    MYALGIAS    Simvastatin Other (See Comments)    MYALGIAS    Nexlizet [Bempedoic Acid-Ezetimibe]     Muscle aches (tolerates Zetia just fine, allergy is with bempedoic acid component)   Tramadol Hcl Nausea Only    nausea (08/2019)    Current Outpatient Medications  Medication Sig Dispense Refill   acetaminophen (TYLENOL) 650 MG CR tablet Take 1,300 mg by mouth every 8 (eight) hours as needed for pain.     aspirin EC 81 MG tablet Take 1 tablet (81 mg total) by mouth daily. Swallow whole. 90 tablet 3   Budeson-Glycopyrrol-Formoterol (BREZTRI AEROSPHERE) 160-9-4.8 MCG/ACT AERO Inhale 2 puffs into the lungs in the morning and at bedtime.     Denosumab (PROLIA [place]) Inject 1 Dose as directed every 6 (six) months. Use as directed.     diphenhydramine-acetaminophen (TYLENOL PM) 25-500 MG TABS tablet Take 1 tablet by mouth at bedtime.     Evolocumab (REPATHA SURECLICK) 140 MG/ML SOAJ INJECT 1 PEN INTO THE SKIN EVERY 14 DAYS 6 mL 3   ezetimibe (ZETIA) 10 MG tablet Take 1 tablet (10 mg total) by mouth daily. 30 tablet 5   lisinopril-hydrochlorothiazide (ZESTORETIC) 20-25 MG tablet Take 0.5 tablets by mouth at bedtime. 45 tablet 3   Magnesium Cl-Calcium Carbonate (SLOW-MAG PO) Take 1 tablet by mouth 2 (two) times daily.     metoprolol succinate (TOPROL-XL) 25 MG 24 hr tablet Take 1/2 (one-half) tablet by mouth once daily 45 tablet 2   nitroGLYCERIN (NITROSTAT) 0.4 MG SL tablet Place 0.4 mg under the tongue every 5 (five) minutes as needed for chest pain.     pantoprazole (PROTONIX) 40 MG tablet Take 40 mg by mouth daily.     spironolactone (ALDACTONE) 25 MG tablet Take 1/2 (one-half) tablet by mouth once daily 45 tablet 0   clopidogrel (PLAVIX) 75 MG tablet Take 1 tablet (75 mg total) by mouth daily. (Patient not taking: No sig reported) 30 tablet 11   No  current facility-administered medications for this visit.    REVIEW OF SYSTEMS:  [X]  denotes positive finding, [ ]  denotes negative finding Cardiac  Comments:  Chest pain or chest pressure:    Shortness of breath upon exertion:    Short of breath when lying flat:    Irregular heart rhythm:        Vascular    Pain in calf, thigh, or hip brought on by ambulation:    Pain in feet at night that wakes you up from your sleep:     Blood clot in your veins:    Leg swelling:  Pulmonary    Oxygen at home:    Productive cough:     Wheezing:         Neurologic    Sudden weakness in arms or legs:     Sudden numbness in arms or legs:     Sudden onset of difficulty speaking or slurred speech:    Temporary loss of vision in one eye:     Problems with dizziness:         Gastrointestinal    Blood in stool:     Vomited blood:         Genitourinary    Burning when urinating:     Blood in urine:        Psychiatric    Major depression:         Hematologic    Bleeding problems:    Problems with blood clotting too easily:        Skin    Rashes or ulcers:        Constitutional    Fever or chills:      PHYSICAL EXAM: Vitals:   08/18/21 1149  BP: (!) 150/79  Pulse: 60  Resp: 14  Temp: 97.6 F (36.4 C)  TempSrc: Temporal  SpO2: 93%  Weight: 112 lb (50.8 kg)  Height: 4' 11.5" (1.511 m)    GENERAL: The patient is a well-nourished female, in no acute distress. The vital signs are documented above. CARDIAC: There is a regular rate and rhythm.  VASCULAR:  Left femoral pulse palpable no hematoma No palpable left pedal pulses but foot is warm PULMONARY: No respiratory distress. ABDOMEN: Soft and non-tender. MUSCULOSKELETAL: There are no major deformities or cyanosis. NEUROLOGIC: No focal weakness or paresthesias are detected.   DATA:   ABIs today are improved on the left from 0.4 to 0.69 after iliac stenting  Assessment/Plan:  87 year old female presents for  follow-up after recent left common iliac and external iliac stenting by Dr. Darrick Penna for lifestyle limiting claudication in the left lower extremity.  She has had a very nice result with stenting and has a palpable left femoral pulse today.  ABIs are improved from 0.4 to 0.69.  She does have a known flush FA occlusion on the left and ultimately would require bypass but with complete resolution of her symptoms after iliac stenting there is no indication for further intervention.  We will follow-up in 6 months with noninvasive imaging.  I asked that she stay on aspirin.  She has completed 6 weeks of Plavix and has a history of recent GI bleed.   Cephus Shelling, MD [facility] Office: [phone]

## 2021-08-20 ENCOUNTER — Other Ambulatory Visit: Payer: Self-pay

## 2021-08-20 DIAGNOSIS — I739 Peripheral vascular disease, unspecified: Secondary | ICD-10-CM

## 2021-09-15 ENCOUNTER — Ambulatory Visit: Payer: Medicare Other | Admitting: Vascular Surgery

## 2021-09-15 ENCOUNTER — Encounter (HOSPITAL_COMMUNITY): Payer: Medicare Other

## 2021-09-29 ENCOUNTER — Other Ambulatory Visit: Payer: Self-pay

## 2021-09-29 ENCOUNTER — Other Ambulatory Visit: Payer: Medicare Other

## 2021-09-29 ENCOUNTER — Ambulatory Visit (HOSPITAL_COMMUNITY): Payer: Medicare Other | Attending: Internal Medicine

## 2021-09-29 DIAGNOSIS — I10 Essential (primary) hypertension: Secondary | ICD-10-CM | POA: Insufficient documentation

## 2021-09-29 DIAGNOSIS — I272 Pulmonary hypertension, unspecified: Secondary | ICD-10-CM | POA: Insufficient documentation

## 2021-09-29 DIAGNOSIS — I2510 Atherosclerotic heart disease of native coronary artery without angina pectoris: Secondary | ICD-10-CM | POA: Diagnosis not present

## 2021-09-29 DIAGNOSIS — R0609 Other forms of dyspnea: Secondary | ICD-10-CM | POA: Insufficient documentation

## 2021-09-29 DIAGNOSIS — E785 Hyperlipidemia, unspecified: Secondary | ICD-10-CM | POA: Insufficient documentation

## 2021-09-29 LAB — HEPATIC FUNCTION PANEL
ALT: 8 IU/L (ref 0–32)
AST: 16 IU/L (ref 0–40)
Albumin: 4.7 g/dL — ABNORMAL HIGH (ref 3.6–4.6)
Alkaline Phosphatase: 48 IU/L (ref 44–121)
Bilirubin Total: 0.4 mg/dL (ref 0.0–1.2)
Bilirubin, Direct: 0.11 mg/dL (ref 0.00–0.40)
Total Protein: 6.3 g/dL (ref 6.0–8.5)

## 2021-09-29 LAB — ECHOCARDIOGRAM COMPLETE
AR max vel: 1.8 cm2
AV Area VTI: 2.02 cm2
AV Area mean vel: 1.92 cm2
AV Mean grad: 10 mmHg
AV Peak grad: 20.6 mmHg
Ao pk vel: 2.27 m/s
Area-P 1/2: 2.72 cm2
P 1/2 time: 541 msec
S' Lateral: 2.5 cm

## 2021-09-29 LAB — LIPID PANEL
Chol/HDL Ratio: 5.6 ratio — ABNORMAL HIGH (ref 0.0–4.4)
Cholesterol, Total: 214 mg/dL — ABNORMAL HIGH (ref 100–199)
HDL: 38 mg/dL — ABNORMAL LOW (ref >39)
LDL Chol Calc (NIH): 140 mg/dL — ABNORMAL HIGH (ref 0–99)
Triglycerides: 199 mg/dL — ABNORMAL HIGH (ref 0–149)
VLDL Cholesterol Cal: 36 mg/dL (ref 5–40)

## 2021-10-01 ENCOUNTER — Encounter: Payer: Self-pay | Admitting: Physician Assistant

## 2021-10-01 NOTE — Progress Notes (Signed)
Cardiology Office Note    Date:  10/05/2021   ID:  Sydney Gross, DOB 11-08-1930, MRN [id number]  PCP:  Mayra Neer, MD  Cardiologist:  Sinclair Grooms, MD  Electrophysiologist:  None   Chief Complaint: planned follow-up of DOE, but complains of left sided abdominal pain and cough x 2 days  History of Present Illness:   Sydney Gross is a 87 y.o. female with history of CAD s/p DES to LAD and diagonal 2019, COPD, PAD, ectatic abdominal aorta, HTN, HLD (prior LDL 363!), upper GIB, allergic rhinitis, mild pulm HTN, arthritis who presents for follow-up.   She was previously evaluated with abnormal stress test in 2019 leading to cardiac cath with results outlined below, ultimately PCI to LAD and diagonal. She was admitted 04/2021 with symptomatic anemia due to upper GIB and non-bleeding stomach ulcer and irritation of the duodenum. Plavix and NSAIDS were discontinued and she was treated with Protonix. She saw Laurann Montana back in follow-up 05/2021 and was doing well. Zetia was resumed (also on PCSKi 2/2 statin intolerance) and she was to remain off Plavix. However, she also has concomitant PAD followed by [facility] with aortogram/intervention 07/2021. She had an episode of transient chest pain post-operatively and troponin was negative. EKG confounded by baseline wander. She was placed back on Plavix by [facility] to continue for 6 weeks. I saw her post-procedurally 08/14/21 at which time she reported feeling like she had a whole new lease on life without claudication, although with c/o increased dyspnea on exertion. It was not clear whether this represented a new problem or simply deconditioning in the context of no longer being limited by claudication. 2D echo 09/29/21 showed EF 60-65%, mild LVH, mildly elevated PASP (previously moderate), mild MR, no significant change from prior. CXR showed emphysema without evidence of superimposed acute cardiopulmonary disease. Labs showed continued mild  anemia.  She presents for follow-up today. Her dyspnea on exertion persists although is not as much of a salient complaint this visit. She says she now has good days as well as days that it's more prominent. She does note persistent left sided abdominal pain and cough the last 2 days. She relays a similar history to when she had diverticulitis. The CMA alerted me to abnormal pulse ox readings in the 80s after ambulating to the exam table. On multiple fingers she had pulse ox readings of 85-90% for several minutes before finally coming up to the mid 90s with resting. No chest pain. No leg pain. No fever. She is otherwise well appearing and in no acute distress.   Labwork independently reviewed: 09/2021 AST/ALT OK, Trig 199, LDL 140 08/2021 Hgb 10.2 (adv to f/u PCP), K 4.5, Cr 0.78, BNP 645, TSH wnl 07/2021 troponin neg x1, Istat labs with K 5.2, Cr 1.00, Hgb 11.9  05/2021 K 5.0, Cr 0.99, Plt 331, Hgb 10.1,AST/ALT OK, LDL 179 by KPN   Past Medical History:  Diagnosis Date   Allergic rhinitis    Arthritis    "maybe a little in my knees and back" (07/17/2018)   Chronic lower back pain    Colitis 12/2013   "never had this before"   COPD (chronic obstructive pulmonary disease) ([place])    "questionable/Dr. Tamala Julian" (07/17/2018)   Coronary artery disease    Family history of anesthesia complication    "mother; think they gave her too much; c/o pain after OR; gave her more RX; they had to bring her back real quick"   GI bleed  H/O calcium pyrophosphate deposition disease (CPPD)    Heart murmur    Hiatal hernia    Hyperlipidemia    Hypertension    Mild mitral regurgitation    Numbness in feet    On home oxygen therapy    "I've used it 2 times in 2 yrs" (07/17/2018)   PAD (peripheral artery disease) (H[facility])    Proteinuria    Pulmonary hypertension ([facility])    Shingles    Stenosis of tear duct     Past Surgical History:  Procedure Laterality Date   ABDOMINAL AORTOGRAM W/LOWER EXTREMITY N/A  07/10/2021   Procedure: ABDOMINAL AORTOGRAM W/LOWER EXTREMITY;  Surgeon: Elam Dutch, MD;  Location: [facility];  Service: Cardiovascular;  Laterality: N/A;   ABDOMINAL HYSTERECTOMY  1960's   APPENDECTOMY     BACK SURGERY     BIOPSY  04/20/2021   Procedure: BIOPSY;  Surgeon: Carol Ada, MD;  Location: [facility];  Service: Endoscopy;;   BLEPHAROPLASTY Bilateral 05/2017   CARDIAC CATHETERIZATION     CATARACT EXTRACTION, BILATERAL Bilateral    CESAREAN SECTION  1957; 1960   CORONARY STENT INTERVENTION N/A 07/17/2018   Procedure: CORONARY STENT INTERVENTION;  Surgeon: Belva Crome, MD;  Location: [facility];  Service: Cardiovascular;  Laterality: N/A;   DACROCYSTORHINOSTOMY W/ JONES TUBE Bilateral 05/2017   "put tear duct in my eyes"   ESOPHAGOGASTRODUODENOSCOPY (EGD) WITH PROPOFOL N/A 04/20/2021   Procedure: ESOPHAGOGASTRODUODENOSCOPY (EGD) WITH PROPOFOL;  Surgeon: Carol Ada, MD;  Location: [facility];  Service: Endoscopy;  Laterality: N/A;   EYE SURGERY     LAPAROSCOPIC CHOLECYSTECTOMY  ~ 2009   LUMBAR DISC SURGERY  ?2004   PERIPHERAL VASCULAR INTERVENTION Left 07/10/2021   Procedure: PERIPHERAL VASCULAR INTERVENTION;  Surgeon: Elam Dutch, MD;  Location: [facility];  Service: Cardiovascular;  Laterality: Left;  common, external iliac arteries   RIGHT/LEFT HEART CATH AND CORONARY ANGIOGRAPHY N/A 07/17/2018   Procedure: RIGHT/LEFT HEART CATH AND CORONARY ANGIOGRAPHY;  Surgeon: Belva Crome, MD;  Location: [facility];  Service: Cardiovascular;  Laterality: N/A;   TONSILLECTOMY AND ADENOIDECTOMY  ?1940's    Current Medications: Current Meds  Medication Sig   acetaminophen (TYLENOL) 650 MG CR tablet Take 1,300 mg by mouth every 8 (eight) hours as needed for pain.   aspirin EC 81 MG tablet Take 1 tablet (81 mg total) by mouth daily. Swallow whole.   Budeson-Glycopyrrol-Formoterol (BREZTRI AEROSPHERE) 160-9-4.8 MCG/ACT AERO Inhale 2 puffs  into the lungs in the morning and at bedtime.   clopidogrel (PLAVIX) 75 MG tablet Take 1 tablet (75 mg total) by mouth daily.   Denosumab (PROLIA [place]) Inject 1 Dose as directed every 6 (six) months. Use as directed.   diphenhydramine-acetaminophen (TYLENOL PM) 25-500 MG TABS tablet Take 1 tablet by mouth at bedtime.   Evolocumab (REPATHA SURECLICK) 725 MG/ML SOAJ INJECT 1 PEN INTO THE SKIN EVERY 14 DAYS   ezetimibe (ZETIA) 10 MG tablet Take 1 tablet (10 mg total) by mouth daily.   lisinopril-hydrochlorothiazide (ZESTORETIC) 20-25 MG tablet Take 0.5 tablets by mouth at bedtime.   Magnesium Cl-Calcium Carbonate (SLOW-MAG PO) Take 1 tablet by mouth 2 (two) times daily.   metoprolol succinate (TOPROL-XL) 25 MG 24 hr tablet Take 1/2 (one-half) tablet by mouth once daily   nitroGLYCERIN (NITROSTAT) 0.4 MG SL tablet Place 0.4 mg under the tongue every 5 (five) minutes as needed for chest pain.     Allergies:   Brilinta [ticagrelor],  Codeine, Darvon [propoxyphene], Lipitor [atorvastatin], Rosuvastatin, Simvastatin, Nexlizet [bempedoic acid-ezetimibe], and Tramadol hcl   Social History   Socioeconomic History   Marital status: Married    Spouse name: Meda Coffee   Number of children: Not on file   Years of education: 12   Highest education level: High school graduate  Occupational History   Occupation: Retired  Tobacco Use   Smoking status: Never   Smokeless tobacco: Never  Vaping Use   Vaping Use: Never used  Substance and Sexual Activity   Alcohol use: Yes    Alcohol/week: 4.0 standard drinks    Types: 4 Glasses of wine per week    Comment: glass of wine every other night   Drug use: Never   Sexual activity: Not Currently  Other Topics Concern   Not on file  Social History Narrative   Not on file   Social Determinants of Health   Financial Resource Strain: Not on file  Food Insecurity: Not on file  Transportation Needs: Not on file  Physical Activity: Not on file  Stress: Not on  file  Social Connections: Not on file     Family History:  The patient's family history includes CVA in her mother; Cancer in her brother; Emphysema (age of onset: 1) in her father.  ROS:   Please see the history of present illness.  All other systems are reviewed and otherwise negative.    EKGs/Labs/Other Studies Reviewed:    Studies reviewed are outlined and summarized above. Reports included below if pertinent.  2D echo 09/29/21  1. Left ventricular ejection fraction, by estimation, is 60 to 65%. The  left ventricle has normal function. The left ventricle has no regional  wall motion abnormalities. There is mild left ventricular hypertrophy.  Left ventricular diastolic parameters  were normal.   2. Right ventricular systolic function is normal. The right ventricular  size is normal. There is mildly elevated pulmonary artery systolic  pressure.   3. The mitral valve is normal in structure. Mild mitral valve  regurgitation.   4. The aortic valve is calcified. Aortic valve regurgitation is mild.  Mild aortic valve stenosis.   5. The inferior vena cava is normal in size with greater than 50%  respiratory variability, suggesting right atrial pressure of 3 mmHg.   Conclusion(s)/Recommendation(s): Compared to prior echocardiogram  04/27/2017 no significant changes.   PV angio 07/2021 Operative management: The patient now has widely patent left iliac system.  She has a flush occlusion of the superficial femoral artery bilaterally.  In light of her age we will see how her symptoms progress over the next 4 to 6 weeks and see if she has had improvement of her walking distance.  Since she is 87 years old unless she has fairly debilitating symptoms I would be reluctant to proceed with a bypass operation for either leg.  I believe that she will be significantly improved by today's procedure.  She was placed on Plavix and aspirin.  The Plavix should be continued for at least 6 weeks.  I would  prefer to continue the Plavix lifetime as long as she tolerates this.   Ruta Hinds, MD [facility] Office: [phone]     Cath 07/2018 Coronary artery disease with high-grade obstruction in the mid LAD and proximal to mid first diagonal. Normal left main. Normal circumflex coronary artery. Dominant RCA with luminal irregularities up to 40% distally. Right heart pressures Ventricular systolic function and LVEDP (14 mmHg). Successful PCI and  stent implantation in both the mid LAD and proximal to mid first diagonal.   The LAD 90% stenosis was reduced to 0% with TIMI grade III flow using a 2.5 x 28 Synergy postdilated to 2.75 mm in diameter.  TIMI grade III flow pre-and post stenting. The first diagonal 90% stenosis was reduced to 0% using by 2.25 mm Synergy deployed at 14 atm which is not effective stent size of 2.32 mm.  TIMI grade III flow noted pre-and post PCI.   RECOMMENDATIONS:   Continue aggressive risk factor modification: PCSK9 therapy (Repatha), moderate aerobic physical activity, blood pressure control less than 140/90 mmHg, and surveillance of glycemic control. Aspirin and Brilinta x1 month.  Switch to aspirin and Plavix thereafter.  Total duration of dual antiplatelet therapy 6 months at which time Plavix can be dropped. Discharge in a.m.   Recommend uninterrupted dual antiplatelet therapy with Aspirin 27m daily and Ticagrelor 961mtwice daily for a minimum of 6 months (stable ischemic heart disease - Class I recommendation).     2D echo 04/2017 - Left ventricle: The cavity size was normal. Wall thickness was    increased in a pattern of mild LVH. Systolic function was    vigorous. The estimated ejection fraction was in the range of 65%    to 70%. Wall motion was normal; there were no regional wall    motion abnormalities. Features are consistent with a pseudonormal    left ventricular filling pattern, with concomitant abnormal     relaxation and increased filling pressure (grade 2 diastolic    dysfunction).  - Left atrium: The atrium was moderately dilated.  - Tricuspid valve: There was moderate regurgitation.  - Pulmonary arteries: Systolic pressure was moderately increased.    PA peak pressure: 41 mm Hg (S).    EKG:  EKG is ordered today, personally reviewed, demonstrating NSR 68bpm, TWI avL, but no acute STT changes  Recent Labs: 04/21/2021: Magnesium 1.7 08/14/2021: BUN 19; Creatinine, Ser 0.78; Hemoglobin 10.2; NT-Pro BNP 645; Platelets 280; Potassium 4.5; Sodium 142; TSH 1.750 09/29/2021: ALT 8  Recent Lipid Panel    Component Value Date/Time   CHOL 214 (H) 09/29/2021 0901   TRIG 199 (H) 09/29/2021 0901   HDL 38 (L) 09/29/2021 0901   CHOLHDL 5.6 (H) 09/29/2021 0901   LDLCALC 140 (H) 09/29/2021 0901    PHYSICAL EXAM:    VS:  BP 138/78   Pulse 76   Ht '4\' 11"'  (1.499 m)   Wt 107 lb (48.5 kg)   SpO2 (!) 85%   BMI 21.61 kg/m   BMI: Body mass index is 21.61 kg/m.  GEN: Well nourished, well developed female in no acute distress HEENT: normocephalic, atraumatic Neck: no JVD, carotid bruits, or masses Cardiac: RRR; no murmurs, rubs, or gallops, no edema  Respiratory:  clear to auscultation bilaterally, normal work of breathing GI: soft, nontender, nondistended, generalized L sided mild tenderness to palpation, no overt masses, + BS MS: no deformity or atrophy Skin: warm and dry, no rash Neuro:  Alert and Oriented x 3, Strength and sensation are intact, follows commands Psych: euthymic mood, full affect  Wt Readings from Last 3 Encounters:  10/05/21 107 lb (48.5 kg)  08/18/21 112 lb (50.8 kg)  08/14/21 114 lb 12.8 oz (52.1 kg)     ASSESSMENT & PLAN:   1. Dyspnea on exertion, hypoxia, also reporting left sided abdominal pain - today's visit was to follow up prior reports of dyspnea on exertion that seemed to occur  moreso after she increased her level of activity since she was no longer limited by  claudication. 2D echocardiogram was reassuring. Her labs have demonstrated some degree of anemia for which I advised she f/u primary care. With recent GI bleeding issues, we had been hesistant to rush into ischemic evaluation so continued medical therapy was employed. However, today she reports significant persistent left sided abdominal pain for 2 days similar to when she had diverticulitis. She also has noticed a worsening cough for the past 2 days, worse at night. No fever or sick contacts. She appears clinically well but I am more concerned about needing evaluation for this given that she is also showing up as hypoxic on our pulse ox on multiple readings. This came up to the 90s after several minutes of sitting at rest. Recommend ED for medical evaluation as above. We will plan to revisit her cardiology follow-up 11/13/21 with Dr. Tamala Julian as previously scheduled.  3. CAD - as outlined above. She remains on ASA and Plavix at this time as she's not sure whether vascular had given her permission to come off ASA. I will follow the results of her acute ER workup and plan to reach out to vascular surgery for their input thereafter. From a cardiac standpoint she was previously cleared to continue ASA only without Plavix. Continue metoprolol and Repatha/Zetia.  4. Essential HTN - BP mildly above goal but also reporting significant abdominal pain. She told us she is no longer taking spironolactone for unclear reasons as well - she is not sure if she means this or lisinopril/HCTZ. They will check her medications at home to verify regimen following her ER workup. She did have a mildly elevated K on prior iStat value but last labs were OK (when she had reported still taking it? Not entirely clear.)  5. Hyperlipidemia - LDL by recent testing was 140, increased from prior value. We also must remember this was previously as high as 363 in 09/2019 so remains significantly improved on PCSK9i/Zetia. I had planned to discuss  potentially adding back low dose pravastatin per pharmD Supple's recommendation on 09/29/21 lab result note, but given acute abdominal pain, I will not make any changes today. This can be revisited in follow-up.  6. Pulmonary HTN, mild mitral regurgitation - stable by follow-up echocardiogram. Follow clinically.  Disposition: F/u with Dr. Tamala Julian as previously scheduled 11/13/21.   Medication Adjustments/Labs and Tests Ordered: Current medicines are reviewed at length with the patient today.  Concerns regarding medicines are outlined above. Medication changes, Labs and Tests ordered today are summarized above and listed in the Patient Instructions accessible in Encounters.   Signed, Charlie Pitter, PA-C  10/05/2021 12:11 PM    [facility] [address], [place], [place]  [address] Phone: [phone]; Fax: [phone]

## 2021-10-02 NOTE — Progress Notes (Signed)
Pt has been made aware of normal result and verbalized understanding.  jw

## 2021-10-05 ENCOUNTER — Telehealth: Payer: Self-pay | Admitting: Interventional Cardiology

## 2021-10-05 ENCOUNTER — Ambulatory Visit: Payer: Medicare Other | Admitting: Physician Assistant

## 2021-10-05 ENCOUNTER — Emergency Department (HOSPITAL_COMMUNITY): Payer: Medicare Other

## 2021-10-05 ENCOUNTER — Encounter: Payer: Self-pay | Admitting: Physician Assistant

## 2021-10-05 ENCOUNTER — Observation Stay (HOSPITAL_COMMUNITY): Payer: Medicare Other

## 2021-10-05 ENCOUNTER — Other Ambulatory Visit: Payer: Self-pay

## 2021-10-05 ENCOUNTER — Observation Stay (HOSPITAL_COMMUNITY)
Admission: EM | Admit: 2021-10-05 | Discharge: 2021-10-06 | Disposition: A | Discharge status: Home / Self Care | Payer: Medicare Other | Attending: Emergency Medicine | Admitting: Emergency Medicine

## 2021-10-05 VITALS — BP 138/78 | HR 76 | Ht 59.0 in | Wt 107.0 lb

## 2021-10-05 DIAGNOSIS — I2510 Atherosclerotic heart disease of native coronary artery without angina pectoris: Secondary | ICD-10-CM

## 2021-10-05 DIAGNOSIS — R109 Unspecified abdominal pain: Secondary | ICD-10-CM | POA: Diagnosis not present

## 2021-10-05 DIAGNOSIS — R0902 Hypoxemia: Secondary | ICD-10-CM | POA: Diagnosis not present

## 2021-10-05 DIAGNOSIS — E785 Hyperlipidemia, unspecified: Secondary | ICD-10-CM | POA: Diagnosis present

## 2021-10-05 DIAGNOSIS — R0609 Other forms of dyspnea: Secondary | ICD-10-CM

## 2021-10-05 DIAGNOSIS — Z79899 Other long term (current) drug therapy: Secondary | ICD-10-CM | POA: Insufficient documentation

## 2021-10-05 DIAGNOSIS — I110 Hypertensive heart disease with heart failure: Secondary | ICD-10-CM | POA: Insufficient documentation

## 2021-10-05 DIAGNOSIS — R1032 Left lower quadrant pain: Secondary | ICD-10-CM | POA: Diagnosis not present

## 2021-10-05 DIAGNOSIS — I272 Pulmonary hypertension, unspecified: Secondary | ICD-10-CM

## 2021-10-05 DIAGNOSIS — E782 Mixed hyperlipidemia: Secondary | ICD-10-CM

## 2021-10-05 DIAGNOSIS — R0602 Shortness of breath: Secondary | ICD-10-CM | POA: Diagnosis not present

## 2021-10-05 DIAGNOSIS — I10 Essential (primary) hypertension: Secondary | ICD-10-CM | POA: Diagnosis not present

## 2021-10-05 DIAGNOSIS — J449 Chronic obstructive pulmonary disease, unspecified: Secondary | ICD-10-CM | POA: Diagnosis present

## 2021-10-05 DIAGNOSIS — I340 Nonrheumatic mitral (valve) insufficiency: Secondary | ICD-10-CM

## 2021-10-05 DIAGNOSIS — I5032 Chronic diastolic (congestive) heart failure: Secondary | ICD-10-CM | POA: Diagnosis not present

## 2021-10-05 DIAGNOSIS — Z7982 Long term (current) use of aspirin: Secondary | ICD-10-CM | POA: Diagnosis not present

## 2021-10-05 DIAGNOSIS — I70219 Atherosclerosis of native arteries of extremities with intermittent claudication, unspecified extremity: Secondary | ICD-10-CM | POA: Diagnosis present

## 2021-10-05 DIAGNOSIS — U071 COVID-19: Principal | ICD-10-CM | POA: Diagnosis present

## 2021-10-05 LAB — URINALYSIS, ROUTINE W REFLEX MICROSCOPIC
Bilirubin Urine: NEGATIVE
Glucose, UA: NEGATIVE mg/dL
Hgb urine dipstick: NEGATIVE
Ketones, ur: NEGATIVE mg/dL
Nitrite: POSITIVE — AB
Protein, ur: NEGATIVE mg/dL
Specific Gravity, Urine: 1.02 (ref 1.005–1.030)
pH: 5 (ref 5.0–8.0)

## 2021-10-05 LAB — CBC
HCT: 38.8 % (ref 36.0–46.0)
Hemoglobin: 11.6 g/dL — ABNORMAL LOW (ref 12.0–15.0)
MCH: 28.4 pg (ref 26.0–34.0)
MCHC: 29.9 g/dL — ABNORMAL LOW (ref 30.0–36.0)
MCV: 95.1 fL (ref 80.0–100.0)
Platelets: 232 10*3/uL (ref 150–400)
RBC: 4.08 MIL/uL (ref 3.87–5.11)
RDW: 18 % — ABNORMAL HIGH (ref 11.5–15.5)
WBC: 7.3 10*3/uL (ref 4.0–10.5)
nRBC: 0 % (ref 0.0–0.2)

## 2021-10-05 LAB — RESP PANEL BY RT-PCR (FLU A&B, COVID) ARPGX2
Influenza A by PCR: NEGATIVE
Influenza B by PCR: NEGATIVE
SARS Coronavirus 2 by RT PCR: POSITIVE — AB

## 2021-10-05 LAB — COMPREHENSIVE METABOLIC PANEL
ALT: 14 U/L (ref 0–44)
AST: 22 U/L (ref 15–41)
Albumin: 4.3 g/dL (ref 3.5–5.0)
Alkaline Phosphatase: 48 U/L (ref 38–126)
Anion gap: 11 (ref 5–15)
BUN: 25 mg/dL — ABNORMAL HIGH (ref 8–23)
CO2: 21 mmol/L — ABNORMAL LOW (ref 22–32)
Calcium: 9.8 mg/dL (ref 8.9–10.3)
Chloride: 106 mmol/L (ref 98–111)
Creatinine, Ser: 1.28 mg/dL — ABNORMAL HIGH (ref 0.44–1.00)
GFR, Estimated: 40 mL/min — ABNORMAL LOW (ref >60)
Glucose, Bld: 110 mg/dL — ABNORMAL HIGH (ref 70–99)
Potassium: 5 mmol/L (ref 3.5–5.1)
Sodium: 138 mmol/L (ref 135–145)
Total Bilirubin: 0.7 mg/dL (ref 0.3–1.2)
Total Protein: 7 g/dL (ref 6.5–8.1)

## 2021-10-05 LAB — D-DIMER, QUANTITATIVE: D-Dimer, Quant: 1.4 ug/mL-FEU — ABNORMAL HIGH (ref 0.00–0.50)

## 2021-10-05 LAB — TRIGLYCERIDES: Triglycerides: 170 mg/dL — ABNORMAL HIGH (ref <150)

## 2021-10-05 LAB — FIBRINOGEN: Fibrinogen: 382 mg/dL (ref 210–475)

## 2021-10-05 LAB — PROCALCITONIN: Procalcitonin: 6.56 ng/mL

## 2021-10-05 LAB — FERRITIN: Ferritin: 16 ng/mL (ref 11–307)

## 2021-10-05 LAB — BRAIN NATRIURETIC PEPTIDE: B Natriuretic Peptide: 53.3 pg/mL (ref 0.0–100.0)

## 2021-10-05 LAB — LIPASE, BLOOD: Lipase: 40 U/L (ref 11–51)

## 2021-10-05 LAB — LACTATE DEHYDROGENASE: LDH: 155 U/L (ref 98–192)

## 2021-10-05 LAB — C-REACTIVE PROTEIN: CRP: 0.6 mg/dL (ref <1.0)

## 2021-10-05 MED ORDER — DEXAMETHASONE 4 MG PO TABS
6.0000 mg | ORAL_TABLET | Freq: Once | ORAL | Status: AC
  Administered 2021-10-05: 6 mg via ORAL
  Filled 2021-10-05: qty 2

## 2021-10-05 MED ORDER — ENOXAPARIN SODIUM 30 MG/0.3ML IJ SOSY
30.0000 mg | PREFILLED_SYRINGE | INTRAMUSCULAR | Status: DC
  Filled 2021-10-05: qty 0.3

## 2021-10-05 MED ORDER — NITROGLYCERIN 0.4 MG SL SUBL: 0.4000 mg | SUBLINGUAL_TABLET | SUBLINGUAL | Status: DC | PRN

## 2021-10-05 MED ORDER — BUDESON-GLYCOPYRROL-FORMOTEROL 160-9-4.8 MCG/ACT IN AERO: 2.0000 | INHALATION_SPRAY | Freq: Two times a day (BID) | RESPIRATORY_TRACT | Status: DC

## 2021-10-05 MED ORDER — LISINOPRIL 10 MG PO TABS
10.0000 mg | ORAL_TABLET | Freq: Every day | ORAL | Status: DC
  Filled 2021-10-05: qty 1

## 2021-10-05 MED ORDER — GUAIFENESIN-DM 100-10 MG/5ML PO SYRP: 10.0000 mL | ORAL_SOLUTION | ORAL | Status: DC | PRN

## 2021-10-05 MED ORDER — ALBUTEROL SULFATE HFA 108 (90 BASE) MCG/ACT IN AERS
2.0000 | INHALATION_SPRAY | Freq: Four times a day (QID) | RESPIRATORY_TRACT | Status: DC
  Filled 2021-10-05 (×2): qty 6.7

## 2021-10-05 MED ORDER — ACETAMINOPHEN 500 MG PO TABS
500.0000 mg | ORAL_TABLET | Freq: Every day | ORAL | Status: DC
  Administered 2021-10-06: 500 mg via ORAL
  Filled 2021-10-05: qty 1

## 2021-10-05 MED ORDER — MORPHINE SULFATE (PF) 2 MG/ML IV SOLN
1.0000 mg | INTRAVENOUS | Status: DC | PRN
Start: 2021-10-05 — End: 2021-10-06

## 2021-10-05 MED ORDER — IOHEXOL 300 MG/ML  SOLN
80.0000 mL | Freq: Once | INTRAMUSCULAR | Status: AC | PRN
  Administered 2021-10-05: 80 mL via INTRAVENOUS

## 2021-10-05 MED ORDER — EZETIMIBE 10 MG PO TABS
10.0000 mg | ORAL_TABLET | Freq: Every day | ORAL | Status: DC
  Filled 2021-10-05 (×2): qty 1

## 2021-10-05 MED ORDER — DIPHENHYDRAMINE HCL 25 MG PO CAPS
25.0000 mg | ORAL_CAPSULE | Freq: Every day | ORAL | Status: DC
  Administered 2021-10-06: 25 mg via ORAL
  Filled 2021-10-05: qty 1

## 2021-10-05 MED ORDER — ASCORBIC ACID 500 MG PO TABS
500.0000 mg | ORAL_TABLET | Freq: Every day | ORAL | Status: DC
  Administered 2021-10-06: 500 mg via ORAL
  Filled 2021-10-05 (×2): qty 1

## 2021-10-05 MED ORDER — LISINOPRIL-HYDROCHLOROTHIAZIDE 20-25 MG PO TABS: 0.5000 | ORAL_TABLET | Freq: Every day | ORAL | Status: DC

## 2021-10-05 MED ORDER — HYDROCHLOROTHIAZIDE 12.5 MG PO TABS
12.5000 mg | ORAL_TABLET | Freq: Every day | ORAL | Status: DC
  Filled 2021-10-05: qty 1

## 2021-10-05 MED ORDER — ZINC SULFATE 220 (50 ZN) MG PO CAPS
220.0000 mg | ORAL_CAPSULE | Freq: Every day | ORAL | Status: DC
  Administered 2021-10-06: 220 mg via ORAL
  Filled 2021-10-05 (×2): qty 1

## 2021-10-05 MED ORDER — MAGNESIUM CHLORIDE 64 MG PO TBEC
1.0000 | DELAYED_RELEASE_TABLET | Freq: Two times a day (BID) | ORAL | Status: DC
  Filled 2021-10-05 (×3): qty 1

## 2021-10-05 MED ORDER — DIPHENHYDRAMINE-APAP (SLEEP) 25-500 MG PO TABS: 1.0000 | ORAL_TABLET | Freq: Every day | ORAL | Status: DC

## 2021-10-05 MED ORDER — ASPIRIN EC 81 MG PO TBEC
81.0000 mg | DELAYED_RELEASE_TABLET | Freq: Every day | ORAL | Status: DC
  Filled 2021-10-05: qty 1

## 2021-10-05 MED ORDER — ACETAMINOPHEN 325 MG PO TABS: 650.0000 mg | ORAL_TABLET | Freq: Four times a day (QID) | ORAL | Status: DC | PRN

## 2021-10-05 MED ORDER — METOPROLOL SUCCINATE ER 25 MG PO TB24
12.5000 mg | ORAL_TABLET | Freq: Every day | ORAL | Status: DC
  Filled 2021-10-05: qty 1

## 2021-10-05 NOTE — Telephone Encounter (Signed)
Patient's husband states he is returning a call. He assumes it is regarding her appointment today, 10/31 with Ronie Spies.

## 2021-10-05 NOTE — Telephone Encounter (Signed)
Spoke with husband and told him I was actually calling about him.

## 2021-10-05 NOTE — ED Notes (Signed)
CT stated at the moment only two scans available. Someone will come to get her as soon as they can.

## 2021-10-05 NOTE — Patient Instructions (Addendum)
Medication Instructions:  Your physician recommends that you continue on your current medications as directed. Please refer to the Current Medication list given to you today.  *If you need a refill on your cardiac medications before your next appointment, please call your pharmacy*   Lab Work: None ordered  If you have labs (blood work) drawn today and your tests are completely normal, you will receive your results only by: MyChart Message (if you have MyChart) OR A paper copy in the mail If you have any lab test that is abnormal or we need to change your treatment, we will call you to review the results.   Testing/Procedures: None ordered    Follow-Up: At [facility], you and your health needs are our priority.  As part of our continuing mission to provide you with exceptional heart care, we have created designated Provider Care Teams.  These Care Teams include your primary Cardiologist (physician) and Advanced Practice Providers (APPs -  Physician Assistants and Nurse Practitioners) who all work together to provide you with the care you need, when you need it.  We recommend signing up for the patient portal called "MyChart".  Sign up information is provided on this After Visit Summary.  MyChart is used to connect with patients for Virtual Visits (Telemedicine).  Patients are able to view lab/test results, encounter notes, upcoming appointments, etc.  Non-urgent messages can be sent to your provider as well.   To learn more about what you can do with MyChart, go to [email or web address].    Your next appointment:   Keep your appointment as scheduled in December with Dr. Katrinka Blazing   The format for your next appointment:   In Person   Other Instructions  PLEASE SHOW THIS TO THE ER: You were seen today for routine cardiology follow-up but we are concerned about your oxygen level and your abdominal pain. You reported having symptoms of significant left-sided abdominal pain and cough  for the last 2 days. Your oxygen level was in the 85-90% range checked on multiple fingers upon arrival and during the visit. This came up to 91% after resting for quite some time, but we would recommend you proceed to the emergency room for further evaluation.  I will reach out to the vascular team to find out if you can stop your Plavix (clopidogrel) pending your workup in the emergency room.  Please double check at home whether you are taking spironolactone and let our offic eknow.

## 2021-10-05 NOTE — ED Provider Notes (Signed)
Emergency Medicine Provider Triage Evaluation Note  KHAMIYAH GREFE , a 87 y.o. female  was evaluated in triage.  Pt complains of shortness of breath and abdominal pain.  Patient was seen at her cardiologist today for routine follow-up, and was found to have oxygen saturation 85% on room air.  Patient states that she has been more short of breath in the past several weeks, history of COPD.  Also reports nonproductive cough and intermittent chills.  Has history of diverticulitis, and is now having increased left upper quadrant left lower quadrant pain since this weekend.  She is been taking Tylenol with some relief.  Review of Systems  Positive: Shortness of breath, cough, chills, abdominal pain Negative: Chest pain, nausea, vomiting, diarrhea, leg swelling  Physical Exam  There were no vitals taken for this visit. Gen:   Awake, no distress   Resp:  Normal effort  MSK:   Moves extremities without difficulty  Other:  Lung sounds clear to auscultation, heart regular rate and rhythm, abdomen soft nontender nondistended  Medical Decision Making  Medically screening exam initiated at 12:56 PM.  Appropriate orders placed.  Angelize Ryce Piatt was informed that the remainder of the evaluation will be completed by another provider, this initial triage assessment does not replace that evaluation, and the importance of remaining in the ED until their evaluation is complete.  Plan to obtain COPD exacerbation and diverticulitis workup including labs, CXR, EKG   Kolby Schara T, PA-C 10/05/21 1256    Horton, Clabe Seal, DO 10/06/21 [phone]

## 2021-10-05 NOTE — ED Notes (Signed)
Contacted [facility] to send PCR Respiratory Panel

## 2021-10-05 NOTE — ED Triage Notes (Signed)
Pt from [facility] for further eval of low SpO2-85% room air- at routine cardiology follow up. Pt reports chronic intermittent shob, no worse than usual today. Recently admitted for diverticulitis and having increased LLQ and LUQ pain this weekend. Denies n/v/d.

## 2021-10-05 NOTE — H&P (Signed)
History and Physical    Sydney Gross [id number] DOB: 1930-02-22 DOA: 10/05/2021  PCP: Mayra Neer, MD Consultants:  cardiology: Dr. Tamala Julian, vascular: Dr. Carlis Abbott,  Patient coming from:  Home - lives with her husband   Chief Complaint: dyspnea on exertion/abdominal pain   HPI: Sydney Gross is a 87 y.o. female with medical history significant of CAD s/p DES to LAD and diagonal in 2019, COPD, PAD, HTN, HLD,mild pulm HTN, GI bleed in 04/2021 who presented to ED after being seen at her cardiologist office today for dyspnea on exertion that is chronic. Her oxygen was noted to be 85-90% after walking for several minutes before coming up to mid 90s with rest. She also has complaints of left sided abdominal pain that is similar to when she had diverticulitis.   She has history of chronic DOE "for a good while now." She states she is no worse than her baseline. She states she has not felt more short of breath than normal with exertion. She also has had a cough for 2 nights, just at night and has had no cough today. No fever at home, but has chills for the past 2 days. She denies any headaches, joint pain or congestion. Her daughter disagrees and feels like she is more short of breath than normal. She states she had visible increased work of breathing with exertion today.   Her main concern is her left sided stomach pain. This started 3 days ago. Pain can be intermittent and is very sharp in nature. Pain can be so bad it's hard to walk. Pain does not radiate anywhere. Pain rated as 10/10 when she has it. Pain worse with movement. Lying still makes it better. Not worse with food, denies any N/V. Her BM have been loose vs. Constipated, but no blood in her stool. Last colonoscopy "long time". It was "fine.". no fh of colon cancer.   She also has history of having home oxygen due to her copd, but states she has never used this.   Covid vaccinated, not boosted.   She has had no chest pain,  palpitations, dysuria or other urinary symptoms, leg swelling, rashes.   ED Course: vitals: aFebrile, blood pressure 118/92, heart rate 72, respiratory rate 20, oxygen 100% on room air. Pertinent labs: Hemoglobin 11.6, BUN 25, creatinine 1.28, covid positive, CXR: no acute process. Abdominal CT pending Patient given 6 mg Decadron in ED and [facility] was asked to admit due to COVID positive and decreased oxygenation with ambulation.   Review of Systems: As per HPI; otherwise review of systems reviewed and negative.   Ambulatory Status:  Ambulates without assistance   Past Medical History:  Diagnosis Date   Allergic rhinitis    Arthritis    "maybe a little in my knees and back" (07/17/2018)   Chronic lower back pain    Colitis 12/2013   "never had this before"   COPD (chronic obstructive pulmonary disease) ([place])    "questionable/Dr. Tamala Julian" (07/17/2018)   Coronary artery disease    Family history of anesthesia complication    "mother; think they gave her too much; c/o pain after OR; gave her more RX; they had to bring her back real quick"   GI bleed    H/O calcium pyrophosphate deposition disease (CPPD)    Heart murmur    Hiatal hernia    Hyperlipidemia    Hypertension    Mild mitral regurgitation    Numbness in feet    On home oxygen  therapy    "I've used it 2 times in 2 yrs" (07/17/2018)   PAD (peripheral artery disease) (HCC)    Proteinuria    Pulmonary hypertension (HCC)    Shingles    Stenosis of tear duct     Past Surgical History:  Procedure Laterality Date   ABDOMINAL AORTOGRAM W/LOWER EXTREMITY N/A 07/10/2021   Procedure: ABDOMINAL AORTOGRAM W/LOWER EXTREMITY;  Surgeon: Elam Dutch, MD;  Location: [facility] [facility];  Service: Cardiovascular;  Laterality: N/A;   ABDOMINAL HYSTERECTOMY  1960's   APPENDECTOMY     BACK SURGERY     BIOPSY  04/20/2021   Procedure: BIOPSY;  Surgeon: Carol Ada, MD;  Location: [facility];  Service: Endoscopy;;   BLEPHAROPLASTY  Bilateral 05/2017   CARDIAC CATHETERIZATION     CATARACT EXTRACTION, BILATERAL Bilateral    CESAREAN SECTION  1957; 1960   CORONARY STENT INTERVENTION N/A 07/17/2018   Procedure: CORONARY STENT INTERVENTION;  Surgeon: Belva Crome, MD;  Location: [facility];  Service: Cardiovascular;  Laterality: N/A;   DACROCYSTORHINOSTOMY W/ JONES TUBE Bilateral 05/2017   "put tear duct in my eyes"   ESOPHAGOGASTRODUODENOSCOPY (EGD) WITH PROPOFOL N/A 04/20/2021   Procedure: ESOPHAGOGASTRODUODENOSCOPY (EGD) WITH PROPOFOL;  Surgeon: Carol Ada, MD;  Location: [facility];  Service: Endoscopy;  Laterality: N/A;   EYE SURGERY     LAPAROSCOPIC CHOLECYSTECTOMY  ~ 2009   LUMBAR DISC SURGERY  ?2004   PERIPHERAL VASCULAR INTERVENTION Left 07/10/2021   Procedure: PERIPHERAL VASCULAR INTERVENTION;  Surgeon: Elam Dutch, MD;  Location: [facility];  Service: Cardiovascular;  Laterality: Left;  common, external iliac arteries   RIGHT/LEFT HEART CATH AND CORONARY ANGIOGRAPHY N/A 07/17/2018   Procedure: RIGHT/LEFT HEART CATH AND CORONARY ANGIOGRAPHY;  Surgeon: Belva Crome, MD;  Location: [facility];  Service: Cardiovascular;  Laterality: N/A;   TONSILLECTOMY AND ADENOIDECTOMY  ?1940's    Social History   Socioeconomic History   Marital status: Married    Spouse name: Sydney Gross   Number of children: Not on file   Years of education: 12   Highest education level: High school graduate  Occupational History   Occupation: Retired  Tobacco Use   Smoking status: Never   Smokeless tobacco: Never  Vaping Use   Vaping Use: Never used  Substance and Sexual Activity   Alcohol use: Yes    Alcohol/week: 4.0 standard drinks    Types: 4 Glasses of wine per week    Comment: glass of wine every other night   Drug use: Never   Sexual activity: Not Currently  Other Topics Concern   Not on file  Social History Narrative   Not on file   Social Determinants of Health   Financial Resource  Strain: Not on file  Food Insecurity: Not on file  Transportation Needs: Not on file  Physical Activity: Not on file  Stress: Not on file  Social Connections: Not on file  Intimate Partner Violence: Not on file    Allergies  Allergen Reactions   Brilinta [Ticagrelor] Shortness Of Breath   Codeine Nausea And Vomiting   Darvon [Propoxyphene] Nausea Only   Lipitor [Atorvastatin] Other (See Comments)    MYALGIAS   Rosuvastatin Other (See Comments)    MYALGIAS    Simvastatin Other (See Comments)    MYALGIAS    Nexlizet [Bempedoic Acid-Ezetimibe]     Muscle aches (tolerates Zetia just fine, allergy is with bempedoic acid component)   Tramadol Hcl Nausea Only  nausea (08/2019)    Family History  Problem Relation Age of Onset   Emphysema Father [id number]   CVA Mother    Cancer Brother        ? type    Prior to Admission medications   Medication Sig Start Date End Date Taking? Authorizing Provider  acetaminophen (TYLENOL) 650 MG CR tablet Take 1,300 mg by mouth every 8 (eight) hours as needed for pain.    [provider]  aspirin EC 81 MG tablet Take 1 tablet (81 mg total) by mouth daily. Swallow whole. 05/12/21   Alver SorrowWalker, Caitlin S, NP  Budeson-Glycopyrrol-Formoterol (BREZTRI AEROSPHERE) 160-9-4.8 MCG/ACT AERO Inhale 2 puffs into the lungs in the morning and at bedtime. 11/15/19   [provider]  clopidogrel (PLAVIX) 75 MG tablet Take 1 tablet (75 mg total) by mouth daily. 07/10/21   Sherren KernsFields, Charles E, MD  Denosumab (PROLIA [place]) Inject 1 Dose as directed every 6 (six) months. Use as directed.    [provider]  diphenhydramine-acetaminophen (TYLENOL PM) 25-500 MG TABS tablet Take 1 tablet by mouth at bedtime.    [provider]  Evolocumab (REPATHA SURECLICK) 140 MG/ML SOAJ INJECT 1 PEN INTO THE SKIN EVERY 14 DAYS 06/26/21   Lyn RecordsSmith, Henry W, MD  ezetimibe (ZETIA) 10 MG tablet Take 1 tablet (10 mg total) by mouth daily. 05/11/21   Alver SorrowWalker, Caitlin S, NP   lisinopril-hydrochlorothiazide (ZESTORETIC) 20-25 MG tablet Take 0.5 tablets by mouth at bedtime. 05/12/21   Alver SorrowWalker, Caitlin S, NP  Magnesium Cl-Calcium Carbonate (SLOW-MAG PO) Take 1 tablet by mouth 2 (two) times daily.    [provider]  metoprolol succinate (TOPROL-XL) 25 MG 24 hr tablet Take 1/2 (one-half) tablet by mouth once daily 08/13/21   Lyn RecordsSmith, Henry W, MD  nitroGLYCERIN (NITROSTAT) 0.4 MG SL tablet Place 0.4 mg under the tongue every 5 (five) minutes as needed for chest pain.    [provider]    Physical Exam: Vitals:   10/05/21 1243 10/05/21 1259 10/05/21 1720 10/05/21 1825  BP: (!) 118/92 136/70 (!) 141/82 (!) 161/79  Pulse: 72 76 72 72  Resp: 20 18 18  (!) 21  Temp: 98.6 F (37 C) 99 F (37.2 C)    TempSrc: Oral     SpO2: 100% 100% 100% 100%     General:  Appears calm and comfortable and is in NAD. Very pleasant and does not appear stated age.  Eyes:  PERRL, EOMI, normal lids, iris ENT:  grossly normal hearing, lips & tongue, mmm; appropriate dentition Neck:  no LAD, masses or thyromegaly; no carotid bruits Cardiovascular:  RRR, systolic murmur. No LE edema.  Respiratory:   CTA bilaterally with no wheezes/rales/rhonchi.  Normal respiratory effort. Abdomen:  soft, TTP iin LLQ, no rebound or guarding, ND, NABS. Tympanic to percussion. Small, reducible umbilical hernia  Back:   normal alignment, no CVAT Skin:  no rash or induration seen on limited exam Musculoskeletal:  grossly normal tone BUE/BLE, good ROM, no bony abnormality Lower extremity:  No LE edema.  Limited foot exam with no ulcerations.  2+ distal pulses. Psychiatric:  grossly normal mood and affect, speech fluent and appropriate, AOx3 Neurologic:  CN 2-12 grossly intact, moves all extremities in coordinated fashion, sensation intact    Radiological Exams on Admission: Independently reviewed - see discussion in A/P where applicable  DG Chest 2 View  Result Date: 10/05/2021 CLINICAL  DATA:  Chronic intermittent shortness of breath. EXAM: CHEST - 2 VIEW COMPARISON:  Chest  x-ray dated August 17, 2021. FINDINGS: The heart size and mediastinal contours are within normal limits. Normal pulmonary vascularity. No focal consolidation, pleural effusion, or pneumothorax. The lungs remain hyperinflated. No acute osseous abnormality. IMPRESSION: 1. No acute cardiopulmonary disease. Electronically Signed   By: Titus Dubin M.D.   On: 10/05/2021 13:13    EKG: Independently reviewed.  NSR with rate 65; nonspecific ST changes with no evidence of acute ischemia   Labs on Admission: I have personally reviewed the available labs and imaging studies at the time of the admission.  Pertinent labs:  Hemoglobin 11.6,  BUN 25,  creatinine 1.28,  covid positive    Assessment/Plan Active Problems:   Dyspnea on exertion with oxygen to the 80s on ambulation -87 year old female who overall looks well noted to have recorded desaturation of oxygenation with walking at cardiology office to 85% and here in the ER to 86% on room air. She was also dyspneic per daughter and working harder to breath than normal.  I had her ambulate in room and overall patient did well did  drop down to 88% but bad waveform on oxygen monitor.  Did not become dyspneic or short of breath.  Easily rebounds with rest. -She is positive for COVID-19 however I wonder if this is more an incidental finding and I am not sure this is the driving force behind ambulatory desaturation. She is 100% on room air.  -She has history of COPD with remote history of having oxygen at home however is never needed this.  No clinical symptoms of a COPD exacerbation.  No PFTs done -Mild pulmonary hypertension -no tachycardia or leg swelling for PE. D-dimer pending.  -hx of CHF, bnp pending. Cxr with no fluid overload and exam euvolemic  -Place her in observation and on telemetry with plan for ambulatory test tomorrow.    COVID-19 -overall not  convinced Covid 19 is driving her DOE and desaturation as stated above.  -received x1 dose of decadron by Ed provider. She is 100% on RA, will hold steroid and defer to day team -covid labs pending, but the ones resulted have been wnl -CXR clear with no acute process -will continue with supportive therapy at this time.  -SABA prn, vitamins, labs -her cycle threshold is only 37.8. -ambulatory challenge tomorrow  -if covid 19 not though to be cause of her desat with ambulation could have paxlovid.     Abdominal pain -non surgical exam, but she has significant tenderness -no fever, no increased wbc, no diarrhea. No indication for abx at this time.  -CT abdomen pending -prn pain meds and gentle IVF -f/u on scan  Elevated creatinine Has not eaten or drank anything all day and still NPO for CT. May still be a bit before scan as they are backed up in ED.  -gentle IVF -hctz/ace-I hold tonight and resume tomorrow if no worse.     Chronic diastolic CHF (congestive heart failure) ([place]) -recent echo: 10/22: EF of 60 to 65% with normal LV function.  Mild LVH.  Normal diastolic parameters.  Mildly elevated pulmonary artery pressure. -No signs of volume overload, clinically is euvolemic. -Monitor intake and output -gently, time limited IVF     COPD (chronic obstructive pulmonary disease) (HCC) -No formal PFT testing although it has been recommended in the past -Continue home inhalers -? If contributing to symptoms.     Atherosclerosis of native arteries of extremity with intermittent claudication (HCC)/HLD/PAD -Statin intolerant. Continue zetia  -continue repatha in outpatient setting -s/p  left common iliac stenting x2 and left external iliac stenting for leg claudication and left iliac occlusion on 07/10/2021. -continue ASA     Hypertension Continue beta-blocker Hold ACE inhibitor and HCTZ tonight    There is no height or weight on file to calculate BMI.   Level of care: Telemetry  Medical DVT prophylaxis:  Lovenox  Code Status:  DNR - confirmed with patient Family Communication: daughter at bedside Disposition Plan:  The patient is from: home  Anticipated d/c is to: home  Anticipated d/c date will depend on clinical response to treatment, but possibly as early as tomorrow if she has excellent response to treatment  Patient is currently: stable Consults called: none  Admission status:  observation   Dragon dictation used in completing this note.   Orland Mustard MD [facility]   How to contact the [facility] Attending or Consulting provider 7A - 7P or covering provider during after hours 7P -7A, for this patient?  Check the care team in [facility] and look for a) attending/consulting [facility] provider listed and b) the [facility] team listed Log into [email or web address] and use [place]'s universal password to access. If you do not have the password, please contact the hospital operator. Locate the [facility] provider you are looking for under [facility] and page to a number that you can be directly reached. If you still have difficulty reaching the provider, please page the [facility] (Director on Call) for the Hospitalists listed on amion for assistance.   10/05/2021, 7:05 PM

## 2021-10-05 NOTE — ED Provider Notes (Signed)
[facility] EMERGENCY DEPARTMENT Provider Note   CSN: [id number] Arrival date & time: 10/05/21  1218     History Chief Complaint  Patient presents with   Shortness of Breath   Abdominal Pain    Sydney Gross is a 87 y.o. female.  87 year old female with a past medical history of CAD s/p PCI, COPD, PAD, hyperlipidemia, hypertension presents today from cardiology clinic for evaluation of hypoxia.  Patient reports for the past several days she has noticed coughing and dyspnea on exertion including orthopnea 2 nights ago.  She also endorses chills over the past few nights requiring multiple blankets.  She denies PND, fever, peripheral edema, chest pain.  She denies any known recent sick contacts.  She was found to have desaturation to 85% when ambulating to exam room in cardiology clinic with recovery on rest.  She also reports left-sided abdominal pain of 3-day duration which she was planning to touch base with her gastroenterologist today but given she was referred to the emergency room has not had a chance.  She denies any changes in her bowel habits, melanotic stools, nausea, vomiting.   The history is provided by the patient. No language interpreter was used.      Past Medical History:  Diagnosis Date   Allergic rhinitis    Arthritis    "maybe a little in my knees and back" (07/17/2018)   Chronic lower back pain    Colitis 12/2013   "never had this before"   COPD (chronic obstructive pulmonary disease) (HCC)    "questionable/Dr. Katrinka Blazing" (07/17/2018)   Coronary artery disease    Family history of anesthesia complication    "mother; think they gave her too much; c/o pain after OR; gave her more RX; they had to bring her back real quick"   GI bleed    H/O calcium pyrophosphate deposition disease (CPPD)    Heart murmur    Hiatal hernia    Hyperlipidemia    Hypertension    Mild mitral regurgitation    Numbness in feet    On home oxygen therapy    "I've  used it 2 times in 2 yrs" (07/17/2018)   PAD (peripheral artery disease) (HCC)    Proteinuria    Pulmonary hypertension (HCC)    Shingles    Stenosis of tear duct     Patient Active Problem List   Diagnosis Date Noted   Gastric ulcer 04/21/2021   Duodenitis 04/21/2021   Gastrointestinal bleed 04/21/2021   Melena 04/19/2021   Hyperlipidemia 09/14/2019   Statin myopathy 09/14/2019   Chronic diastolic CHF (congestive heart failure) (HCC) 08/02/2018   Coronary artery disease of native artery of native heart with stable angina pectoris (HCC)    COPD (chronic obstructive pulmonary disease) (HCC) 11/18/2016   PAD (peripheral artery disease) (HCC) 11/18/2016   Bradycardia    Dilated aortic root (HCC) 01/27/2015   Dyspnea on exertion 12/16/2014   Aortic stenosis, supravalvular 12/16/2014   Atherosclerosis of native arteries of extremity with intermittent claudication (HCC) 08/30/2014   Malnutrition of moderate degree (HCC) 12/12/2013   Hypertension 12/09/2013   Dyslipidemia 12/09/2013   Enteritis due to Clostridium difficile 12/05/2013    Past Surgical History:  Procedure Laterality Date   ABDOMINAL AORTOGRAM W/LOWER EXTREMITY N/A 07/10/2021   Procedure: ABDOMINAL AORTOGRAM W/LOWER EXTREMITY;  Surgeon: Sherren Kerns, MD;  Location: [facility];  Service: Cardiovascular;  Laterality: N/A;   ABDOMINAL HYSTERECTOMY  1960's   APPENDECTOMY  BACK SURGERY     BIOPSY  04/20/2021   Procedure: BIOPSY;  Surgeon: Carol Ada, MD;  Location: [facility];  Service: Endoscopy;;   BLEPHAROPLASTY Bilateral 05/2017   CARDIAC CATHETERIZATION     CATARACT EXTRACTION, BILATERAL Bilateral    CESAREAN SECTION  1957; 1960   CORONARY STENT INTERVENTION N/A 07/17/2018   Procedure: CORONARY STENT INTERVENTION;  Surgeon: Belva Crome, MD;  Location: [place] [facility];  Service: Cardiovascular;  Laterality: N/A;   DACROCYSTORHINOSTOMY W/ JONES TUBE Bilateral 05/2017   "put tear duct in my  eyes"   ESOPHAGOGASTRODUODENOSCOPY (EGD) WITH PROPOFOL N/A 04/20/2021   Procedure: ESOPHAGOGASTRODUODENOSCOPY (EGD) WITH PROPOFOL;  Surgeon: Carol Ada, MD;  Location: [facility];  Service: Endoscopy;  Laterality: N/A;   EYE SURGERY     LAPAROSCOPIC CHOLECYSTECTOMY  ~ 2009   LUMBAR DISC SURGERY  ?2004   PERIPHERAL VASCULAR INTERVENTION Left 07/10/2021   Procedure: PERIPHERAL VASCULAR INTERVENTION;  Surgeon: Elam Dutch, MD;  Location: [place] [facility];  Service: Cardiovascular;  Laterality: Left;  common, external iliac arteries   RIGHT/LEFT HEART CATH AND CORONARY ANGIOGRAPHY N/A 07/17/2018   Procedure: RIGHT/LEFT HEART CATH AND CORONARY ANGIOGRAPHY;  Surgeon: Belva Crome, MD;  Location: [facility];  Service: Cardiovascular;  Laterality: N/A;   TONSILLECTOMY AND ADENOIDECTOMY  ?1940's     OB History   No obstetric history on file.     Family History  Problem Relation Age of Onset   Emphysema Father 10   CVA Mother    Cancer Brother        ? type    Social History   Tobacco Use   Smoking status: Never   Smokeless tobacco: Never  Vaping Use   Vaping Use: Never used  Substance Use Topics   Alcohol use: Yes    Alcohol/week: 4.0 standard drinks    Types: 4 Glasses of wine per week    Comment: glass of wine every other night   Drug use: Never    Home Medications Prior to Admission medications   Medication Sig Start Date End Date Taking? Authorizing Provider  acetaminophen (TYLENOL) 650 MG CR tablet Take 1,300 mg by mouth every 8 (eight) hours as needed for pain.    [provider]  aspirin EC 81 MG tablet Take 1 tablet (81 mg total) by mouth daily. Swallow whole. 05/12/21   Loel Dubonnet, NP  Budeson-Glycopyrrol-Formoterol (BREZTRI AEROSPHERE) 160-9-4.8 MCG/ACT AERO Inhale 2 puffs into the lungs in the morning and at bedtime. 11/15/19   [provider]  clopidogrel (PLAVIX) 75 MG tablet Take 1 tablet (75 mg total) by mouth daily.  07/10/21   Elam Dutch, MD  Denosumab (PROLIA [place]) Inject 1 Dose as directed every 6 (six) months. Use as directed.    [provider]  diphenhydramine-acetaminophen (TYLENOL PM) 25-500 MG TABS tablet Take 1 tablet by mouth at bedtime.    [provider]  Evolocumab (REPATHA SURECLICK) [id number] MG/ML SOAJ INJECT 1 PEN INTO THE SKIN EVERY 14 DAYS 06/26/21   Belva Crome, MD  ezetimibe (ZETIA) 10 MG tablet Take 1 tablet (10 mg total) by mouth daily. 05/11/21   Loel Dubonnet, NP  lisinopril-hydrochlorothiazide (ZESTORETIC) 20-25 MG tablet Take 0.5 tablets by mouth at bedtime. 05/12/21   Loel Dubonnet, NP  Magnesium Cl-Calcium Carbonate (SLOW-MAG PO) Take 1 tablet by mouth 2 (two) times daily.    [provider]  metoprolol succinate (TOPROL-XL) 25 MG 24 hr  tablet Take 1/2 (one-half) tablet by mouth once daily 08/13/21   Belva Crome, MD  nitroGLYCERIN (NITROSTAT) 0.4 MG SL tablet Place 0.4 mg under the tongue every 5 (five) minutes as needed for chest pain.    [provider]    Allergies    Brilinta [ticagrelor], Codeine, Darvon [propoxyphene], Lipitor [atorvastatin], Rosuvastatin, Simvastatin, Nexlizet [bempedoic acid-ezetimibe], and Tramadol hcl  Review of Systems   Review of Systems  Constitutional:  Positive for appetite change and chills. Negative for activity change, diaphoresis and fever.  Cardiovascular:  Negative for chest pain and leg swelling.  Gastrointestinal:  Positive for abdominal pain. Negative for abdominal distention, constipation, diarrhea, nausea and vomiting.  Genitourinary:  Negative for difficulty urinating.  Neurological:  Negative for weakness and light-headedness.  All other systems reviewed and are negative.  Physical Exam Updated Vital Signs BP (!) 141/82 (BP Location: Right Arm)   Pulse 72   Temp 99 F (37.2 C)   Resp 18   SpO2 100%   Physical Exam Vitals and nursing note reviewed.  Constitutional:      General: She  is not in acute distress.    Appearance: Normal appearance. She is not ill-appearing.  HENT:     Head: Normocephalic and atraumatic.     Nose: Nose normal.  Eyes:     General: No scleral icterus.    Extraocular Movements: Extraocular movements intact.     Conjunctiva/sclera: Conjunctivae normal.  Cardiovascular:     Rate and Rhythm: Normal rate and regular rhythm.     Pulses: Normal pulses.     Heart sounds: Normal heart sounds.  Pulmonary:     Effort: Pulmonary effort is normal. No respiratory distress.     Breath sounds: Normal breath sounds. No wheezing or rales.  Abdominal:     General: There is distension.     Tenderness: There is abdominal tenderness. There is no guarding or rebound.  Musculoskeletal:        General: Normal range of motion.     Cervical back: Normal range of motion.     Right lower leg: No edema.     Left lower leg: No edema.  Skin:    General: Skin is warm and dry.  Neurological:     General: No focal deficit present.     Mental Status: She is alert. Mental status is at baseline.    ED Results / Procedures / Treatments   Labs (all labs ordered are listed, but only abnormal results are displayed) Labs Reviewed  RESP PANEL BY RT-PCR (FLU A&B, COVID) ARPGX2 - Abnormal; Notable for the following components:      Result Value   SARS Coronavirus 2 by RT PCR POSITIVE (*)    All other components within normal limits  COMPREHENSIVE METABOLIC PANEL - Abnormal; Notable for the following components:   CO2 21 (*)    Glucose, Bld 110 (*)    BUN 25 (*)    Creatinine, Ser 1.28 (*)    GFR, Estimated 40 (*)    All other components within normal limits  CBC - Abnormal; Notable for the following components:   Hemoglobin 11.6 (*)    MCHC 29.9 (*)    RDW 18.0 (*)    All other components within normal limits  LIPASE, BLOOD  BRAIN NATRIURETIC PEPTIDE  URINALYSIS, ROUTINE W REFLEX MICROSCOPIC    EKG None  Radiology DG Chest 2 View  Result Date:  10/05/2021 CLINICAL DATA:  Chronic intermittent shortness of breath. EXAM:  CHEST - 2 VIEW COMPARISON:  Chest x-ray dated August 17, 2021. FINDINGS: The heart size and mediastinal contours are within normal limits. Normal pulmonary vascularity. No focal consolidation, pleural effusion, or pneumothorax. The lungs remain hyperinflated. No acute osseous abnormality. IMPRESSION: 1. No acute cardiopulmonary disease. Electronically Signed   By: Titus Dubin M.D.   On: 10/05/2021 13:13    Procedures Procedures   Medications Ordered in ED Medications - No data to display  ED Course  I have reviewed the triage vital signs and the nursing notes.  Pertinent labs & imaging results that were available during my care of the patient were reviewed by me and considered in my medical decision making (see chart for details).    MDM Rules/Calculators/A&P                           87 year old female with a past medical history of CAD, COPD presents today for evaluation of hypoxia and referred to emergency room for evaluation from her cardiology clinic.  Patient's respiratory panel positive for COVID.  CMP unremarkable with the exception of glucose of 110, and creatinine 1.28 which is above her baseline, CBC unremarkable with the exception of hemoglobin of 11.6 which appears to be at her baseline.  Lipase 40.  Chest x-ray without signs of pneumonia or volume overload.  I ambulated patient around the stretcher and noted desaturation to 86% and increased work of breathing after few laps with improvement to 100% on rest.  Given these findings we will discuss case with hospitalist for admission.  Given patient's new onset abdominal pain and abdominal distention that she was planning to have evaluated with her gastroenterologist I will order CT abdomen pelvis to evaluate for any acute intra-abdominal processes.  COVID lab work ordered.  Patient also given 1 dose of 6 mg dexamethasone p.o.  Discussed with  hospitalist who will evaluate patient for admission.    Final Clinical Impression(s) / ED Diagnoses Final diagnoses:  Left lower quadrant abdominal pain  Hypoxia  COVID-19    Rx / DC Orders ED Discharge Orders     None        Evlyn Courier, PA-C 10/05/21 1832    Dorie Rank, MD 10/06/21 1521

## 2021-10-06 ENCOUNTER — Observation Stay (HOSPITAL_BASED_OUTPATIENT_CLINIC_OR_DEPARTMENT_OTHER): Payer: Medicare Other

## 2021-10-06 DIAGNOSIS — J449 Chronic obstructive pulmonary disease, unspecified: Secondary | ICD-10-CM

## 2021-10-06 DIAGNOSIS — R109 Unspecified abdominal pain: Secondary | ICD-10-CM | POA: Diagnosis not present

## 2021-10-06 DIAGNOSIS — U071 COVID-19: Secondary | ICD-10-CM

## 2021-10-06 DIAGNOSIS — R7989 Other specified abnormal findings of blood chemistry: Secondary | ICD-10-CM

## 2021-10-06 DIAGNOSIS — I5032 Chronic diastolic (congestive) heart failure: Secondary | ICD-10-CM | POA: Diagnosis not present

## 2021-10-06 LAB — MAGNESIUM: Magnesium: 2.1 mg/dL (ref 1.7–2.4)

## 2021-10-06 LAB — CBC WITH DIFFERENTIAL/PLATELET
Abs Immature Granulocytes: 0.02 10*3/uL (ref 0.00–0.07)
Basophils Absolute: 0 10*3/uL (ref 0.0–0.1)
Basophils Relative: 0 %
Eosinophils Absolute: 0 10*3/uL (ref 0.0–0.5)
Eosinophils Relative: 0 %
HCT: 36.7 % (ref 36.0–46.0)
Hemoglobin: 11.4 g/dL — ABNORMAL LOW (ref 12.0–15.0)
Immature Granulocytes: 0 %
Lymphocytes Relative: 17 %
Lymphs Abs: 1.2 10*3/uL (ref 0.7–4.0)
MCH: 28.9 pg (ref 26.0–34.0)
MCHC: 31.1 g/dL (ref 30.0–36.0)
MCV: 93.1 fL (ref 80.0–100.0)
Monocytes Absolute: 0.2 10*3/uL (ref 0.1–1.0)
Monocytes Relative: 3 %
Neutro Abs: 5.5 10*3/uL (ref 1.7–7.7)
Neutrophils Relative %: 80 %
Platelets: 215 10*3/uL (ref 150–400)
RBC: 3.94 MIL/uL (ref 3.87–5.11)
RDW: 17.6 % — ABNORMAL HIGH (ref 11.5–15.5)
WBC: 6.9 10*3/uL (ref 4.0–10.5)
nRBC: 0 % (ref 0.0–0.2)

## 2021-10-06 LAB — RESPIRATORY PANEL BY PCR

## 2021-10-06 LAB — COMPREHENSIVE METABOLIC PANEL
ALT: 14 U/L (ref 0–44)
AST: 19 U/L (ref 15–41)
Albumin: 3.8 g/dL (ref 3.5–5.0)
Alkaline Phosphatase: 45 U/L (ref 38–126)
Anion gap: 11 (ref 5–15)
BUN: 31 mg/dL — ABNORMAL HIGH (ref 8–23)
CO2: 19 mmol/L — ABNORMAL LOW (ref 22–32)
Calcium: 9.6 mg/dL (ref 8.9–10.3)
Chloride: 103 mmol/L (ref 98–111)
Creatinine, Ser: 1.1 mg/dL — ABNORMAL HIGH (ref 0.44–1.00)
GFR, Estimated: 48 mL/min — ABNORMAL LOW (ref >60)
Glucose, Bld: 190 mg/dL — ABNORMAL HIGH (ref 70–99)
Potassium: 4.8 mmol/L (ref 3.5–5.1)
Sodium: 133 mmol/L — ABNORMAL LOW (ref 135–145)
Total Bilirubin: 0.5 mg/dL (ref 0.3–1.2)
Total Protein: 6.1 g/dL — ABNORMAL LOW (ref 6.5–8.1)

## 2021-10-06 LAB — PROCALCITONIN: Procalcitonin: <0.1 ng/mL

## 2021-10-06 LAB — D-DIMER, QUANTITATIVE: D-Dimer, Quant: 1.48 ug/mL-FEU — ABNORMAL HIGH (ref 0.00–0.50)

## 2021-10-06 LAB — PHOSPHORUS: Phosphorus: 3.1 mg/dL (ref 2.5–4.6)

## 2021-10-06 LAB — FERRITIN: Ferritin: 17 ng/mL (ref 11–307)

## 2021-10-06 LAB — C-REACTIVE PROTEIN: CRP: 0.7 mg/dL (ref <1.0)

## 2021-10-06 MED ORDER — FLUTICASONE FUROATE-VILANTEROL 100-25 MCG/ACT IN AEPB
1.0000 | INHALATION_SPRAY | Freq: Every day | RESPIRATORY_TRACT | Status: DC
  Filled 2021-10-06: qty 28

## 2021-10-06 MED ORDER — UMECLIDINIUM BROMIDE 62.5 MCG/ACT IN AEPB
1.0000 | INHALATION_SPRAY | Freq: Every day | RESPIRATORY_TRACT | Status: DC
  Filled 2021-10-06: qty 7

## 2021-10-06 NOTE — ED Notes (Signed)
Pt ambulated in room. SpO2 maintained in 90's never dropping below 90% RA

## 2021-10-06 NOTE — Discharge Summary (Signed)
PATIENT DETAILS Name: Sydney Gross Age: 87 y.o. Sex: female Date of Birth: 24-Oct-1930 MRN: [id number]. Admitting Physician: Orland Mustard, MD WUJ:WJXB, Rockney Ghee, MD  Admit Date: 10/05/2021 Discharge date: 10/06/2021  Recommendations for Outpatient Follow-up:  Follow up with PCP in 1-2 weeks Please obtain CMP/CBC in one week   Admitted From:  Home  Disposition: Home   Home Health: No  Equipment/Devices: None  Discharge Condition: Stable  CODE STATUS: FULL CODE  Diet recommendation:  Diet Order             Diet - low sodium heart healthy           Diet Heart Room service appropriate? Yes; Fluid consistency: Thin  Diet effective now                    Brief Summary: See H&P, Labs, Consult and Test reports for all details in brief, patient is a 87 year old female with history of CAD s/p PCI to LAD in 2019, COPD, PAD, HTN, HLD, pulmonary hypertension who was referred to the ED for evaluation of exertional dyspnea and left sided abdominal pain.   Brief Hospital Course: Dyspnea on exertion: Patient has chronic dyspnea with exertion at baseline that has been going on for several years-per patient-she really does not feel far from her usual baseline.  She has no signs of CHF.  She did test positive for COVID-19-and acknowledges some cough and mild fatigue over the past few days that probably was playing some role in his symptomatology.  Nursing staff ambulated her in the hallway-her O2 saturations never dropped below 92%-and she felt that her exertional dyspnea that she has chronically was back to her usual baseline.  COVID-19 infection: She does acknowledge some fatigue and cough for the past 2-3 days.  She otherwise does not have any other symptoms.  Her cycle threshold was 37.8 per H&P.  Chest x-ray without pneumonia.  D-dimer was minimally elevated but O2 saturations were 100% when I was in the room with her (on room air).  Lower extremity Dopplers were  negative for DVT.  Patient was ambulated in the hallway by nursing staff without any major issues-O2 saturation stayed above 92% persistently.  Doubt further work-up/treatment is required since her presenting complaints of dyspnea on exertion have improved and she is back to her baseline.  Left-sided abdominal pain: Has resolved-her abdominal exam is completely benign-even with deep palpation-I was not able to reproduce any pain.  CT of the abdomen did not show any obvious etiologies for pain.  Suspect that this may be a musculoskeletal issue from coughing.  Per patient-she does have occasional similar pain that has been ongoing for the past several months/years.  Chronic diastolic heart failure: Euvolemic on exam  COPD: No exacerbation-continue inhalers.  CAD/PAD: Continue usual antiplatelet agents/Zetia.  She is apparently statin intolerant.  HTN: BP stable-resume beta-blocker/ACE inhibitor and HCTZ.  Aneurysmal dilatation of the right common iliac artery (increase in size compared to previous) with possible dissection vs ulcerated plaque: Incidental finding-Case was discussed with Dr. Lester Kinsman surgery on-call-who reviewed CT images-and felt that there was no dissection-and this was a ulcerated plaque-advise outpatient follow-up with vascular surgery.   Procedures None  Discharge Diagnoses:  Active Problems:   Hypertension   Atherosclerosis of native arteries of extremity with intermittent claudication (HCC)   Dyspnea on exertion   COPD (chronic obstructive pulmonary disease) (HCC)   Chronic diastolic CHF (congestive heart failure) (HCC)   Hyperlipidemia   COVID-19  Abdominal pain   Discharge Instructions:  Activity:  As tolerated with Full fall precautions use walker/cane & assistance as needed   Discharge Instructions     Call MD for:  difficulty breathing, headache or visual disturbances   Complete by: As directed    Diet - low sodium heart healthy   Complete by: As  directed    Discharge instructions   Complete by: As directed    Follow with Primary MD  Lupita Raider, MD in 1-2 weeks  Please get a complete blood count and chemistry panel checked by your Primary MD at your next visit, and again as instructed by your Primary MD.  Get Medicines reviewed and adjusted: Please take all your medications with you for your next visit with your Primary MD  Laboratory/radiological data: Please request your Primary MD to go over all hospital tests and procedure/radiological results at the follow up, please ask your Primary MD to get all Hospital records sent to his/her office.  In some cases, they will be blood work, cultures and biopsy results pending at the time of your discharge. Please request that your primary care M.D. follows up on these results.  Also Note the following: If you experience worsening of your admission symptoms, develop shortness of breath, life threatening emergency, suicidal or homicidal thoughts you must seek medical attention immediately by calling 911 or calling your MD immediately  if symptoms less severe.  You must read complete instructions/literature along with all the possible adverse reactions/side effects for all the Medicines you take and that have been prescribed to you. Take any new Medicines after you have completely understood and accpet all the possible adverse reactions/side effects.   Do not drive when taking Pain medications or sleeping medications (Benzodaizepines)  Do not take more than prescribed Pain, Sleep and Anxiety Medications. It is not advisable to combine anxiety,sleep and pain medications without talking with your primary care practitioner  Special Instructions: If you have smoked or chewed Tobacco  in the last 2 yrs please stop smoking, stop any regular Alcohol  and or any Recreational drug use.  Wear Seat belts while driving.  Please note: You were cared for by a hospitalist during your hospital stay.  Once you are discharged, your primary care physician will handle any further medical issues. Please note that NO REFILLS for any discharge medications will be authorized once you are discharged, as it is imperative that you return to your primary care physician (or establish a relationship with a primary care physician if you do not have one) for your post hospital discharge needs so that they can reassess your need for medications and monitor your lab values.   Increase activity slowly   Complete by: As directed       Allergies as of 10/06/2021       Reactions   Brilinta [ticagrelor] Shortness Of Breath   Codeine Nausea And Vomiting   Darvon [propoxyphene] Nausea Only   Lipitor [atorvastatin] Other (See Comments)   MYALGIAS   Rosuvastatin Other (See Comments)   MYALGIAS   Simvastatin Other (See Comments)   MYALGIAS   Nexlizet [bempedoic Acid-ezetimibe]    Muscle aches (tolerates Zetia just fine, allergy is with bempedoic acid component)   Tramadol Hcl Nausea Only   nausea (08/2019)        Medication List     TAKE these medications    acetaminophen 650 MG CR tablet Commonly known as: TYLENOL Take 1,300 mg by mouth every 8 (eight) hours  as needed for pain.   aspirin EC 81 MG tablet Take 1 tablet (81 mg total) by mouth daily. Swallow whole. What changed: when to take this   Breztri Aerosphere 160-9-4.8 MCG/ACT Aero Generic drug: Budeson-Glycopyrrol-Formoterol Inhale 2 puffs into the lungs in the morning and at bedtime.   clopidogrel 75 MG tablet Commonly known as: Plavix Take 1 tablet (75 mg total) by mouth daily.   denosumab 60 MG/ML Sosy injection Commonly known as: PROLIA Inject 60 mg as directed every 6 (six) months.   diphenhydramine-acetaminophen 25-500 MG Tabs tablet Commonly known as: TYLENOL PM Take 1 tablet by mouth at bedtime.   ezetimibe 10 MG tablet Commonly known as: ZETIA Take 1 tablet (10 mg total) by mouth daily.   lisinopril-hydrochlorothiazide  20-25 MG tablet Commonly known as: ZESTORETIC Take 0.5 tablets by mouth at bedtime.   metoprolol succinate 25 MG 24 hr tablet Commonly known as: TOPROL-XL Take 1/2 (one-half) tablet by mouth once daily What changed: See the new instructions.   nitroGLYCERIN 0.4 MG SL tablet Commonly known as: NITROSTAT Place 0.4 mg under the tongue every 5 (five) minutes as needed for chest pain.   Repatha SureClick 140 MG/ML Soaj Generic drug: Evolocumab INJECT 1 PEN INTO THE SKIN EVERY 14 DAYS What changed: See the new instructions.   SLOW-MAG PO Take 1 tablet by mouth 2 (two) times daily.        Follow-up Information     Lupita Raider, MD. Schedule an appointment as soon as possible for a visit in 1 week(s).   Specialty: Family Medicine Contact information: [address] [place] [address] [phone]         Lyn Records, MD Follow up in 1 month(s).   Specialty: Cardiology Contact information: [address] [place] [address] [phone]         Sherren Kerns, MD Follow up in 1 month(s).   Specialties: Vascular Surgery, Cardiology               Allergies  Allergen Reactions   Brilinta [Ticagrelor] Shortness Of Breath   Codeine Nausea And Vomiting   Darvon [Propoxyphene] Nausea Only   Lipitor [Atorvastatin] Other (See Comments)    MYALGIAS   Rosuvastatin Other (See Comments)    MYALGIAS    Simvastatin Other (See Comments)    MYALGIAS    Nexlizet [Bempedoic Acid-Ezetimibe]     Muscle aches (tolerates Zetia just fine, allergy is with bempedoic acid component)   Tramadol Hcl Nausea Only    nausea (08/2019)      Consultations: None   Other Procedures/Studies: DG Chest 2 View  Result Date: 10/05/2021 CLINICAL DATA:  Chronic intermittent shortness of breath. EXAM: CHEST - 2 VIEW COMPARISON:  Chest x-ray dated August 17, 2021. FINDINGS: The heart size and mediastinal contours are within normal  limits. Normal pulmonary vascularity. No focal consolidation, pleural effusion, or pneumothorax. The lungs remain hyperinflated. No acute osseous abnormality. IMPRESSION: 1. No acute cardiopulmonary disease. Electronically Signed   By: Obie Dredge M.D.   On: 10/05/2021 13:13   CT ABDOMEN PELVIS W CONTRAST  Result Date: 10/05/2021 CLINICAL DATA:  Acute left-sided abdominal pain. EXAM: CT ABDOMEN AND PELVIS WITH CONTRAST TECHNIQUE: Multidetector CT imaging of the abdomen and pelvis was performed using the standard protocol following bolus administration of intravenous contrast. CONTRAST:  80mL OMNIPAQUE IOHEXOL 300 MG/ML  SOLN COMPARISON:  CT abdomen and pelvis 04/19/2021. FINDINGS: Lower chest: No acute abnormality. Hepatobiliary: No  focal liver abnormality is seen. Status post cholecystectomy. No biliary dilatation. Pancreas: Unremarkable. No pancreatic ductal dilatation or surrounding inflammatory changes. Spleen: Normal in size without focal abnormality. Adrenals/Urinary Tract: There is mild left renal atrophy, unchanged. There is no hydronephrosis or perinephric fluid. Bilateral renal cysts are similar to the prior exam. The largest cyst is in the inferior pole the right kidney measuring 2.8 cm. The adrenal glands and bladder are within normal limits. Stomach/Bowel: Stomach is within normal limits. No evidence of bowel wall thickening, distention, or inflammatory changes. There is colonic diverticulosis without evidence for acute diverticulitis. The appendix is likely surgically absent. Vascular/Lymphatic: There is no evidence for aortic aneurysm. There is a new left iliac artery stent in place. There is atherosclerotic calcification throughout the aorta and iliac arteries. There is aneurysmal dilatation of the right common iliac artery measuring 18 mm (previously 16 mm). There is a new area of focal dissection versus ulcerated plaque at this level. There is no surrounding inflammatory stranding or  fluid. Reproductive: Status post hysterectomy. No adnexal masses. Other: No abdominal wall hernia or abnormality. No abdominopelvic ascites. Musculoskeletal: There is scoliosis and degenerative change throughout the spine similar to the prior study. IMPRESSION: 1. 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 There is new focal dissection versus ulcerated plaque at this level. Recommend clinical correlation and follow-up. No surrounding hematoma. 2. Colonic diverticulosis without evidence for acute diverticulitis. 3.  Aortic Atherosclerosis (ICD10-I70.0). Electronically Signed   By: Darliss Cheney M.D.   On: 10/05/2021 22:10   ECHOCARDIOGRAM COMPLETE  Result Date: 09/29/2021    ECHOCARDIOGRAM REPORT   Patient Name:   PORTIA WISDOM Date of Exam: 09/29/2021 Medical Rec #:  [id number]         Height:       59.5 in Accession #:    [id number]        Weight:       112.0 lb Date of Birth:  01-01-1930         BSA:          1.450 m Patient Age:    89 years          BP:           150/79 mmHg Patient Gender: F                 HR:           60 bpm. Exam Location:  [address] Procedure: 2D Echo, Cardiac Doppler and Color Doppler Indications:    I25.10 CAD  History:        Patient has prior history of Echocardiogram examinations, most                 recent 04/27/2017. CAD, Pulmonary HTN and COPD, Supravalvular                 aortic stenosis; Risk Factors:Hypertension and Dyslipidemia.  Sonographer:    Samule Ohm RDCS Referring Phys: 63 DAYNA N DUNN IMPRESSIONS  1. Left ventricular ejection fraction, by estimation, is 60 to 65%. The left ventricle has normal function. The left ventricle has no regional wall motion abnormalities. There is mild left ventricular hypertrophy. Left ventricular diastolic parameters were normal.  2. Right ventricular systolic function is normal. The right ventricular size is normal. There is mildly elevated pulmonary artery systolic pressure.  3.  The mitral valve is normal in structure. Mild mitral valve regurgitation.  4. The aortic valve  is calcified. Aortic valve regurgitation is mild. Mild aortic valve stenosis.  5. The inferior vena cava is normal in size with greater than 50% respiratory variability, suggesting right atrial pressure of 3 mmHg. Conclusion(s)/Recommendation(s): Compared to prior echocardiogram 04/27/2017 no significant changes. FINDINGS  Left Ventricle: Left ventricular ejection fraction, by estimation, is 60 to 65%. The left ventricle has normal function. The left ventricle has no regional wall motion abnormalities. The left ventricular internal cavity size was normal in size. There is  mild left ventricular hypertrophy. Left ventricular diastolic parameters were normal. Right Ventricle: The right ventricular size is normal. Right ventricular systolic function is normal. There is mildly elevated pulmonary artery systolic pressure. Left Atrium: Left atrial size was normal in size. Right Atrium: Right atrial size was normal in size. Pericardium: Trivial pericardial effusion is present. Mitral Valve: The mitral valve is normal in structure. Mild mitral valve regurgitation. Tricuspid Valve: The tricuspid valve is normal in structure. Tricuspid valve regurgitation is mild. Aortic Valve: The aortic valve is calcified. Aortic valve regurgitation is mild. Aortic regurgitation PHT measures 541 msec. Mild aortic stenosis is present. Aortic valve mean gradient measures 10.0 mmHg. Aortic valve peak gradient measures 20.6 mmHg. Aortic valve area, by VTI measures 2.02 cm. Pulmonic Valve: The pulmonic valve was normal in structure. Pulmonic valve regurgitation is not visualized. Aorta: The aortic root and ascending aorta are structurally normal, with no evidence of dilitation. Venous: The inferior vena cava is normal in size with greater than 50% respiratory variability, suggesting right atrial pressure of 3 mmHg.  LEFT VENTRICLE PLAX 2D LVIDd:          4.00 cm   Diastology LVIDs:         2.50 cm   LV e' medial:    8.05 cm/s LV PW:         1.10 cm   LV E/e' medial:  17.0 LV IVS:        1.20 cm   LV e' lateral:   10.70 cm/s LVOT diam:     1.80 cm   LV E/e' lateral: 12.8 LV SV:         104 LV SV Index:   71 LVOT Area:     2.54 cm  RIGHT VENTRICLE             IVC RV S prime:     15.90 cm/s  IVC diam: 1.40 cm TAPSE (M-mode): 1.7 cm RVSP:           45.8 mmHg LEFT ATRIUM             Index        RIGHT ATRIUM           Index LA diam:        4.10 cm 2.83 cm/m   RA Pressure: 3.00 mmHg LA Vol (A2C):   45.7 ml 31.52 ml/m  RA Area:     8.98 cm LA Vol (A4C):   39.8 ml 27.45 ml/m  RA Volume:   15.00 ml  10.35 ml/m LA Biplane Vol: 43.2 ml 29.80 ml/m  AORTIC VALVE AV Area (Vmax):    1.80 cm AV Area (Vmean):   1.92 cm AV Area (VTI):     2.02 cm AV Vmax:           227.00 cm/s AV Vmean:          144.333 cm/s AV VTI:            0.513 m AV Peak  Grad:      20.6 mmHg AV Mean Grad:      10.0 mmHg LVOT Vmax:         161.00 cm/s LVOT Vmean:        109.000 cm/s LVOT VTI:          0.407 m LVOT/AV VTI ratio: 0.79 AI PHT:            541 msec  AORTA Ao Asc diam: 2.50 cm MITRAL VALVE                TRICUSPID VALVE MV Area (PHT): 2.72 cm     TR Peak grad:   42.8 mmHg MV Decel Time: 279 msec     TR Vmax:        327.00 cm/s MV E velocity: 137.00 cm/s  Estimated RAP:  3.00 mmHg MV A velocity: 65.50 cm/s   RVSP:           45.8 mmHg MV E/A ratio:  2.09                             SHUNTS                             Systemic VTI:  0.41 m                             Systemic Diam: 1.80 cm Carolan Clines Electronically signed by Carolan Clines Signature Date/Time: 09/29/2021/1:49:44 PM    Final    VAS [name] LOWER EXTREMITY VENOUS (DVT)  Result Date: 10/06/2021  Lower Venous DVT Study Patient Name:  SHARMAN GARROTT  Date of Exam:   10/06/2021 Medical Rec #: [id number]          Accession #:    [id number] Date of Birth: [birth year]          Patient Gender: F Patient Age:   60 years Exam Location:  [facility] Procedure:      VAS [name] LOWER EXTREMITY VENOUS (DVT) Referring Phys: Jeoffrey Massed --------------------------------------------------------------------------------  Indications: Covid- elevated ddimer.  Comparison Study: no prior Performing Technologist: [place] Ponder RVS  Examination Guidelines: A complete evaluation includes B-mode imaging, spectral Doppler, color Doppler, and power Doppler as needed of all accessible portions of each vessel. Bilateral testing is considered an integral part of a complete examination. Limited examinations for reoccurring indications may be performed as noted. The reflux portion of the exam is performed with the patient in reverse Trendelenburg.  +---------+---------------+---------+-----------+----------+--------------+ RIGHT    CompressibilityPhasicitySpontaneityPropertiesThrombus Aging +---------+---------------+---------+-----------+----------+--------------+ CFV      Full           Yes      Yes                                 +---------+---------------+---------+-----------+----------+--------------+ SFJ      Full                                                        +---------+---------------+---------+-----------+----------+--------------+ FV Prox  Full                                                        +---------+---------------+---------+-----------+----------+--------------+  FV Mid   Full                                                        +---------+---------------+---------+-----------+----------+--------------+ FV DistalFull                                                        +---------+---------------+---------+-----------+----------+--------------+ PFV      Full                                                        +---------+---------------+---------+-----------+----------+--------------+ POP      Full           Yes      Yes                                  +---------+---------------+---------+-----------+----------+--------------+ PTV      Full                                                        +---------+---------------+---------+-----------+----------+--------------+ PERO     Full                                                        +---------+---------------+---------+-----------+----------+--------------+   +---------+---------------+---------+-----------+----------+--------------+ LEFT     CompressibilityPhasicitySpontaneityPropertiesThrombus Aging +---------+---------------+---------+-----------+----------+--------------+ CFV      Full           Yes      Yes                                 +---------+---------------+---------+-----------+----------+--------------+ SFJ      Full                                                        +---------+---------------+---------+-----------+----------+--------------+ FV Prox  Full                                                        +---------+---------------+---------+-----------+----------+--------------+ FV Mid   Full                                                        +---------+---------------+---------+-----------+----------+--------------+  FV DistalFull                                                        +---------+---------------+---------+-----------+----------+--------------+ PFV      Full                                                        +---------+---------------+---------+-----------+----------+--------------+ POP      Full           Yes      Yes                                 +---------+---------------+---------+-----------+----------+--------------+ PTV      Full                                                        +---------+---------------+---------+-----------+----------+--------------+ PERO     Full                                                         +---------+---------------+---------+-----------+----------+--------------+     Summary: BILATERAL: - No evidence of deep vein thrombosis seen in the lower extremities, bilaterally. -No evidence of popliteal cyst, bilaterally.   *See table(s) above for measurements and observations.    Preliminary      TODAY-DAY OF DISCHARGE:  Subjective:   Daronda Gaur today has no headache,no chest abdominal pain,no new weakness tingling or numbness, feels much better wants to go home today.   Objective:   Blood pressure (!) 176/96, pulse 77, temperature 98.1 F (36.7 C), resp. rate (!) 24, SpO2 99 %. No intake or output data in the 24 hours ending 10/06/21 1149 There were no vitals filed for this visit.  Exam: Awake Alert, Oriented *3, No new F.N deficits, Normal affect [place].AT,PERRAL Supple Neck,No JVD, No cervical lymphadenopathy appriciated.  Symmetrical Chest wall movement, Good air movement bilaterally, CTAB RRR,No Gallops,Rubs or new Murmurs, No Parasternal Heave +ve B.Sounds, Abd Soft, Non tender, No organomegaly appriciated, No rebound -guarding or rigidity. No Cyanosis, Clubbing or edema, No new Rash or bruise   PERTINENT RADIOLOGIC STUDIES: DG Chest 2 View  Result Date: 10/05/2021 CLINICAL DATA:  Chronic intermittent shortness of breath. EXAM: CHEST - 2 VIEW COMPARISON:  Chest x-ray dated August 17, 2021. FINDINGS: The heart size and mediastinal contours are within normal limits. Normal pulmonary vascularity. No focal consolidation, pleural effusion, or pneumothorax. The lungs remain hyperinflated. No acute osseous abnormality. IMPRESSION: 1. No acute cardiopulmonary disease. Electronically Signed   By: Obie Dredge M.D.   On: 10/05/2021 13:13   CT ABDOMEN PELVIS W CONTRAST  Result Date: 10/05/2021 CLINICAL DATA:  Acute left-sided abdominal pain. EXAM: CT ABDOMEN AND PELVIS WITH CONTRAST TECHNIQUE: Multidetector CT imaging of the abdomen and pelvis was performed  using the  standard protocol following bolus administration of intravenous contrast. CONTRAST:  46mL OMNIPAQUE IOHEXOL 300 MG/ML  SOLN COMPARISON:  CT abdomen and pelvis 04/19/2021. FINDINGS: Lower chest: No acute abnormality. Hepatobiliary: No focal liver abnormality is seen. Status post cholecystectomy. No biliary dilatation. Pancreas: Unremarkable. No pancreatic ductal dilatation or surrounding inflammatory changes. Spleen: Normal in size without focal abnormality. Adrenals/Urinary Tract: There is mild left renal atrophy, unchanged. There is no hydronephrosis or perinephric fluid. Bilateral renal cysts are similar to the prior exam. The largest cyst is in the inferior pole the right kidney measuring 2.8 cm. The adrenal glands and bladder are within normal limits. Stomach/Bowel: Stomach is within normal limits. No evidence of bowel wall thickening, distention, or inflammatory changes. There is colonic diverticulosis without evidence for acute diverticulitis. The appendix is likely surgically absent. Vascular/Lymphatic: There is no evidence for aortic aneurysm. There is a new left iliac artery stent in place. There is atherosclerotic calcification throughout the aorta and iliac arteries. There is aneurysmal dilatation of the right common iliac artery measuring 18 mm (previously 16 mm). There is a new area of focal dissection versus ulcerated plaque at this level. There is no surrounding inflammatory stranding or fluid. Reproductive: Status post hysterectomy. No adnexal masses. Other: No abdominal wall hernia or abnormality. No abdominopelvic ascites. Musculoskeletal: There is scoliosis and degenerative change throughout the spine similar to the prior study. IMPRESSION: 1. 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 There is new focal dissection versus ulcerated plaque at this level. Recommend clinical correlation and follow-up. No surrounding hematoma. 2. Colonic diverticulosis without  evidence for acute diverticulitis. 3.  Aortic Atherosclerosis (ICD10-I70.0). Electronically Signed   By: Darliss Cheney M.D.   On: 10/05/2021 22:10   VAS [name] LOWER EXTREMITY VENOUS (DVT)  Result Date: 10/06/2021  Lower Venous DVT Study Patient Name:  SHATIKA GRINNELL  Date of Exam:   10/06/2021 Medical Rec #: [id number]          Accession #:    [id number] Date of Birth: December 31, 1929          Patient Gender: F Patient Age:   31 years Exam Location:  [facility] Procedure:      VAS [name] LOWER EXTREMITY VENOUS (DVT) Referring Phys: Jeoffrey Massed --------------------------------------------------------------------------------  Indications: Covid- elevated ddimer.  Comparison Study: no prior Performing Technologist: [place] Ponder RVS  Examination Guidelines: A complete evaluation includes B-mode imaging, spectral Doppler, color Doppler, and power Doppler as needed of all accessible portions of each vessel. Bilateral testing is considered an integral part of a complete examination. Limited examinations for reoccurring indications may be performed as noted. The reflux portion of the exam is performed with the patient in reverse Trendelenburg.  +---------+---------------+---------+-----------+----------+--------------+ RIGHT    CompressibilityPhasicitySpontaneityPropertiesThrombus Aging +---------+---------------+---------+-----------+----------+--------------+ CFV      Full           Yes      Yes                                 +---------+---------------+---------+-----------+----------+--------------+ SFJ      Full                                                        +---------+---------------+---------+-----------+----------+--------------+  FV Prox  Full                                                        +---------+---------------+---------+-----------+----------+--------------+ FV Mid   Full                                                         +---------+---------------+---------+-----------+----------+--------------+ FV DistalFull                                                        +---------+---------------+---------+-----------+----------+--------------+ PFV      Full                                                        +---------+---------------+---------+-----------+----------+--------------+ POP      Full           Yes      Yes                                 +---------+---------------+---------+-----------+----------+--------------+ PTV      Full                                                        +---------+---------------+---------+-----------+----------+--------------+ PERO     Full                                                        +---------+---------------+---------+-----------+----------+--------------+   +---------+---------------+---------+-----------+----------+--------------+ LEFT     CompressibilityPhasicitySpontaneityPropertiesThrombus Aging +---------+---------------+---------+-----------+----------+--------------+ CFV      Full           Yes      Yes                                 +---------+---------------+---------+-----------+----------+--------------+ SFJ      Full                                                        +---------+---------------+---------+-----------+----------+--------------+ FV Prox  Full                                                        +---------+---------------+---------+-----------+----------+--------------+  FV Mid   Full                                                        +---------+---------------+---------+-----------+----------+--------------+ FV DistalFull                                                        +---------+---------------+---------+-----------+----------+--------------+ PFV      Full                                                         +---------+---------------+---------+-----------+----------+--------------+ POP      Full           Yes      Yes                                 +---------+---------------+---------+-----------+----------+--------------+ PTV      Full                                                        +---------+---------------+---------+-----------+----------+--------------+ PERO     Full                                                        +---------+---------------+---------+-----------+----------+--------------+     Summary: BILATERAL: - No evidence of deep vein thrombosis seen in the lower extremities, bilaterally. -No evidence of popliteal cyst, bilaterally.   *See table(s) above for measurements and observations.    Preliminary      PERTINENT LAB RESULTS: CBC: Recent Labs    10/05/21 1254 10/06/21 0323  WBC 7.3 6.9  HGB 11.6* 11.4*  HCT 38.8 36.7  PLT 232 215   CMET CMP     Component Value Date/Time   NA 133 (L) 10/06/2021 0323   NA 142 08/14/2021 1634   K 4.8 10/06/2021 0323   CL 103 10/06/2021 0323   CO2 19 (L) 10/06/2021 0323   GLUCOSE 190 (H) 10/06/2021 0323   BUN 31 (H) 10/06/2021 0323   BUN 19 08/14/2021 1634   CREATININE 1.10 (H) 10/06/2021 0323   CALCIUM 9.6 10/06/2021 0323   PROT 6.1 (L) 10/06/2021 0323   PROT 6.3 09/29/2021 0901   ALBUMIN 3.8 10/06/2021 0323   ALBUMIN 4.7 (H) 09/29/2021 0901   AST 19 10/06/2021 0323   ALT 14 10/06/2021 0323   ALKPHOS 45 10/06/2021 0323   BILITOT 0.5 10/06/2021 0323   BILITOT 0.4 09/29/2021 0901   GFRNONAA 48 (L) 10/06/2021 0323   GFRAA 36 (L) 01/29/2019 2115    GFR Estimated Creatinine Clearance: 23.6 mL/min (A) (by C-G formula based on SCr of  1.1 mg/dL (H)). Recent Labs    10/05/21 1254  LIPASE 40   No results for input(s): CKTOTAL, CKMB, CKMBINDEX, TROPONINI in the last 72 hours. Invalid input(s): POCBNP Recent Labs    10/05/21 1807 10/06/21 0323  DDIMER 1.40* 1.48*   No results for input(s): HGBA1C  in the last 72 hours. Recent Labs    10/05/21 1807  TRIG 170*   No results for input(s): TSH, T4TOTAL, T3FREE, THYROIDAB in the last 72 hours.  Invalid input(s): FREET3 Recent Labs    10/05/21 1807 10/06/21 0323  FERRITIN 16 17   Coags: No results for input(s): INR in the last 72 hours.  Invalid input(s): PT Microbiology: Recent Results (from the past 240 hour(s))  Resp Panel by RT-PCR (Flu A&B, Covid) Nasopharyngeal Swab     Status: Abnormal   Collection Time: 10/05/21 12:54 PM   Specimen: Nasopharyngeal Swab; Nasopharyngeal(NP) swabs in vial transport medium  Result Value Ref Range Status   SARS Coronavirus 2 by RT PCR POSITIVE (A) NEGATIVE Final    Comment: RESULT CALLED TO, READ BACK BY AND VERIFIED WITH: C COGAN RN [id number] 10/05/21 A BROWNING (NOTE) SARS-CoV-2 target nucleic acids are DETECTED.  The SARS-CoV-2 RNA is generally detectable in upper respiratory specimens during the acute phase of infection. Positive results are indicative of the presence of the identified virus, but do not rule out bacterial infection or co-infection with other pathogens not detected by the test. Clinical correlation with patient history and other diagnostic information is necessary to determine patient infection status. The expected result is Negative.  Fact Sheet for Patients: BloggerCourse.com  Fact Sheet for Healthcare Providers: SeriousBroker.it  This test is not yet approved or cleared by the [place] FDA and  has been authorized for detection and/or diagnosis of SARS-CoV-2 by FDA under an Emergency Use Authorization (EUA).  This EUA will remain in effect (meaning this test can  be used) for the duration of  the COVID-19 declaration under Section 564(b)(1) of the Act, 21 U.S.C. section 360bbb-3(b)(1), unless the authorization is terminated or revoked sooner.     Influenza A by PCR NEGATIVE NEGATIVE Final   Influenza B by  PCR NEGATIVE NEGATIVE Final    Comment: (NOTE) The Xpert Xpress SARS-CoV-2/FLU/RSV plus assay is intended as an aid in the diagnosis of influenza from Nasopharyngeal swab specimens and should not be used as a sole basis for treatment. Nasal washings and aspirates are unacceptable for Xpert Xpress SARS-CoV-2/FLU/RSV testing.  Fact Sheet for Patients: BloggerCourse.com  Fact Sheet for Healthcare Providers: SeriousBroker.it  This test is not yet approved or cleared by the [place] FDA and has been authorized for detection and/or diagnosis of SARS-CoV-2 by FDA under an Emergency Use Authorization (EUA). This EUA will remain in effect (meaning this test can be used) for the duration of the COVID-19 declaration under Section 564(b)(1) of the Act, 21 U.S.C. section 360bbb-3(b)(1), unless the authorization is terminated or revoked.  Performed at [facility], [address], [place], [place] [address]   Respiratory (~20 pathogens) panel by PCR     Status: None   Collection Time: 10/05/21 10:53 PM   Specimen: Nasopharyngeal Swab; Respiratory  Result Value Ref Range Status   Adenovirus NOT DETECTED NOT DETECTED Final   Coronavirus 229E NOT DETECTED NOT DETECTED Final    Comment: (NOTE) The Coronavirus on the Respiratory Panel, DOES NOT test for the novel  Coronavirus (2019 nCoV)    Coronavirus HKU1 NOT DETECTED NOT DETECTED Final  Coronavirus NL63 NOT DETECTED NOT DETECTED Final   Coronavirus OC43 NOT DETECTED NOT DETECTED Final   Metapneumovirus NOT DETECTED NOT DETECTED Final   Rhinovirus / Enterovirus NOT DETECTED NOT DETECTED Final   Influenza A NOT DETECTED NOT DETECTED Final   Influenza B NOT DETECTED NOT DETECTED Final   Parainfluenza Virus 1 NOT DETECTED NOT DETECTED Final   Parainfluenza Virus 2 NOT DETECTED NOT DETECTED Final   Parainfluenza Virus 3 NOT DETECTED NOT DETECTED Final   Parainfluenza Virus 4 NOT  DETECTED NOT DETECTED Final   Respiratory Syncytial Virus NOT DETECTED NOT DETECTED Final   Bordetella pertussis NOT DETECTED NOT DETECTED Final   Bordetella Parapertussis NOT DETECTED NOT DETECTED Final   Chlamydophila pneumoniae NOT DETECTED NOT DETECTED Final   Mycoplasma pneumoniae NOT DETECTED NOT DETECTED Final    Comment: Performed at [facility], [address] [place], [place] [address]    FURTHER DISCHARGE INSTRUCTIONS:  Get Medicines reviewed and adjusted: Please take all your medications with you for your next visit with your Primary MD  Laboratory/radiological data: Please request your Primary MD to go over all hospital tests and procedure/radiological results at the follow up, please ask your Primary MD to get all Hospital records sent to his/her office.  In some cases, they will be blood work, cultures and biopsy results pending at the time of your discharge. Please request that your primary care M.D. goes through all the records of your hospital data and follows up on these results.  Also Note the following: If you experience worsening of your admission symptoms, develop shortness of breath, life threatening emergency, suicidal or homicidal thoughts you must seek medical attention immediately by calling 911 or calling your MD immediately  if symptoms less severe.  You must read complete instructions/literature along with all the possible adverse reactions/side effects for all the Medicines you take and that have been prescribed to you. Take any new Medicines after you have completely understood and accpet all the possible adverse reactions/side effects.   Do not drive when taking Pain medications or sleeping medications (Benzodaizepines)  Do not take more than prescribed Pain, Sleep and Anxiety Medications. It is not advisable to combine anxiety,sleep and pain medications without talking with your primary care practitioner  Special Instructions: If you have smoked  or chewed Tobacco  in the last 2 yrs please stop smoking, stop any regular Alcohol  and or any Recreational drug use.  Wear Seat belts while driving.  Please note: You were cared for by a hospitalist during your hospital stay. Once you are discharged, your primary care physician will handle any further medical issues. Please note that NO REFILLS for any discharge medications will be authorized once you are discharged, as it is imperative that you return to your primary care physician (or establish a relationship with a primary care physician if you do not have one) for your post hospital discharge needs so that they can reassess your need for medications and monitor your lab values.  Total Time spent coordinating discharge including counseling, education and face to face time equals 35 minutes.  SignedJeoffrey Massed 10/06/2021 11:49 AM

## 2021-10-06 NOTE — Progress Notes (Signed)
Lower extremity venous has been completed.   Preliminary results in CV Proc.   Kaeleb Emond Shahan Starks 10/06/2021 10:06 AM

## 2021-10-07 ENCOUNTER — Telehealth: Payer: Self-pay | Admitting: Physician Assistant

## 2021-10-07 NOTE — Telephone Encounter (Signed)
Call placed to pt regarding message below.  She has been made aware that per Dr. Chestine Spore, she can stop the Plavix and continue the Aspirin.  Pt verbalized understanding and was appreciative for the call.

## 2021-10-07 NOTE — Telephone Encounter (Signed)
   I recently saw patient in clinic in cardiology follow-up at which time patient was not sure whether she should still be taking Plavix. In our prior notes this summer, she was to be on ASA only due to hx of GI bleed but ended up having PV intervention  07/10/21 by Dr. Darrick Penna, prompting re-initiation of Plavix. Per Dr. Ophelia Charter [facility] note 08/18/21, " I asked that she stay on aspirin.  She has completed 6 weeks of Plavix and has a history of recent GI bleed." Plavix was still on her med list at OV with me on 10/05/21.  I will route to Dr. Chestine Spore to ensure she is OK to discontinue Plavix at this point. She will continue ASA.  Kumari Sculley PA-C

## 2021-10-07 NOTE — Telephone Encounter (Signed)
See Dr. Ophelia Charter reply below - please let pt/daughter know we have clarified she can stop Plavix, but continue baby ASA. Hope she is feeling better. Keep f/u as planned. Thanks.

## 2021-10-13 DIAGNOSIS — I739 Peripheral vascular disease, unspecified: Secondary | ICD-10-CM | POA: Diagnosis not present

## 2021-10-13 DIAGNOSIS — U071 COVID-19: Secondary | ICD-10-CM | POA: Diagnosis not present

## 2021-10-13 DIAGNOSIS — R109 Unspecified abdominal pain: Secondary | ICD-10-CM | POA: Diagnosis not present

## 2021-10-13 DIAGNOSIS — J9691 Respiratory failure, unspecified with hypoxia: Secondary | ICD-10-CM | POA: Diagnosis not present

## 2021-10-16 ENCOUNTER — Other Ambulatory Visit: Payer: Self-pay | Admitting: Interventional Cardiology

## 2021-10-22 DIAGNOSIS — K5730 Diverticulosis of large intestine without perforation or abscess without bleeding: Secondary | ICD-10-CM | POA: Diagnosis not present

## 2021-10-22 DIAGNOSIS — R1032 Left lower quadrant pain: Secondary | ICD-10-CM | POA: Diagnosis not present

## 2021-10-22 DIAGNOSIS — K5904 Chronic idiopathic constipation: Secondary | ICD-10-CM | POA: Diagnosis not present

## 2021-11-02 ENCOUNTER — Other Ambulatory Visit: Payer: Self-pay | Admitting: Interventional Cardiology

## 2021-11-10 ENCOUNTER — Ambulatory Visit: Payer: Medicare Other | Admitting: Vascular Surgery

## 2021-11-12 NOTE — Progress Notes (Signed)
Cardiology Office Note:    Date:  11/13/2021   ID:  SALEISHA OSSMAN, DOB 06-13-1930, MRN [id number]  PCP:  Mayra Neer, MD  Cardiologist:  Sinclair Grooms, MD   Referring MD: Mayra Neer, MD   Chief Complaint  Patient presents with   Follow-up    Shortness of breath    History of Present Illness:    Sydney Gross is a 87 y.o. female with a hx of CAD with prior stents, hyperlipidemia, hypertension, and claudication.  Recently seen for dyspnea on exertion by Melina Copa, PA-C.  Unexplained hypoxia was noted with ambulation.  Prior mildly elevated NT pro BNP levels around 600 in September 2022.  Ultimately seen in the emergency room and found to have COVID-19 October 05, 2021.  She is doing well after the above emergency room visit and overnight hospital stay.  In the emergency room she did not have hypoxia with ambulation suggesting that some of the recordings done in our office were in error.  Claudication is markedly improved.  She denies chest pain.  Shortness of breath is her major complaint.  This has been a longstanding complaint.  Overall, she feels there is a diagnosis of COPD.  She was told this by physicians.  She was never a smoker and had no occupational exposure.  She is on bronchodilator therapy.  She has had prior pulmonary function testing but it was years ago.  She denies orthopnea and lower extremity swelling.  Recent echocardiogram demonstrated normal systolic function without diastolic abnormality.  Past Medical History:  Diagnosis Date   Allergic rhinitis    Arthritis    "maybe a little in my knees and back" (07/17/2018)   Chronic lower back pain    Colitis 12/2013   "never had this before"   COPD (chronic obstructive pulmonary disease) ([place])    "questionable/Dr. Tamala Julian" (07/17/2018)   Coronary artery disease    Family history of anesthesia complication    "mother; think they gave her too much; c/o pain after OR; gave her more RX; they had to bring  her back real quick"   GI bleed    H/O calcium pyrophosphate deposition disease (CPPD)    Heart murmur    Hiatal hernia    Hyperlipidemia    Hypertension    Mild mitral regurgitation    Numbness in feet    On home oxygen therapy    "I've used it 2 times in 2 yrs" (07/17/2018)   PAD (peripheral artery disease) (HCC)    Proteinuria    Pulmonary hypertension ([place])    Shingles    Stenosis of tear duct     Past Surgical History:  Procedure Laterality Date   ABDOMINAL AORTOGRAM W/LOWER EXTREMITY N/A 07/10/2021   Procedure: ABDOMINAL AORTOGRAM W/LOWER EXTREMITY;  Surgeon: Elam Dutch, MD;  Location: [facility];  Service: Cardiovascular;  Laterality: N/A;   ABDOMINAL HYSTERECTOMY  1960's   APPENDECTOMY     BACK SURGERY     BIOPSY  04/20/2021   Procedure: BIOPSY;  Surgeon: Carol Ada, MD;  Location: [facility];  Service: Endoscopy;;   BLEPHAROPLASTY Bilateral 05/2017   CARDIAC CATHETERIZATION     CATARACT EXTRACTION, BILATERAL Bilateral    CESAREAN SECTION  1957; 1960   CORONARY STENT INTERVENTION N/A 07/17/2018   Procedure: CORONARY STENT INTERVENTION;  Surgeon: Belva Crome, MD;  Location: [facility];  Service: Cardiovascular;  Laterality: N/A;   DACROCYSTORHINOSTOMY W/ JONES TUBE Bilateral 05/2017   "  put tear duct in my eyes"   ESOPHAGOGASTRODUODENOSCOPY (EGD) WITH PROPOFOL N/A 04/20/2021   Procedure: ESOPHAGOGASTRODUODENOSCOPY (EGD) WITH PROPOFOL;  Surgeon: Carol Ada, MD;  [facility]: [facility];  Service: Endoscopy;  Laterality: N/A;   EYE SURGERY     LAPAROSCOPIC CHOLECYSTECTOMY  ~ 2009   LUMBAR DISC SURGERY  ?2004   PERIPHERAL VASCULAR INTERVENTION Left 07/10/2021   Procedure: PERIPHERAL VASCULAR INTERVENTION;  Surgeon: Elam Dutch, MD;  Location: [facility];  Service: Cardiovascular;  Laterality: Left;  common, external iliac arteries   RIGHT/LEFT HEART CATH AND CORONARY ANGIOGRAPHY N/A 07/17/2018   Procedure: RIGHT/LEFT HEART CATH AND  CORONARY ANGIOGRAPHY;  Surgeon: Belva Crome, MD;  Location: [facility];  Service: Cardiovascular;  Laterality: N/A;   TONSILLECTOMY AND ADENOIDECTOMY  ?1940's    Current Medications: Current Meds  Medication Sig   acetaminophen (TYLENOL) 650 MG CR tablet Take 1,300 mg by mouth every 8 (eight) hours as needed for pain.   aspirin EC 81 MG tablet Take 1 tablet (81 mg total) by mouth daily. Swallow whole.   Budeson-Glycopyrrol-Formoterol (BREZTRI AEROSPHERE) 160-9-4.8 MCG/ACT AERO Inhale 2 puffs into the lungs in the morning and at bedtime.   denosumab (PROLIA) 60 MG/ML SOSY injection Inject 60 mg as directed every 6 (six) months.   diphenhydramine-acetaminophen (TYLENOL PM) 25-500 MG TABS tablet Take 1 tablet by mouth at bedtime.   Evolocumab (REPATHA SURECLICK) XX123456 MG/ML SOAJ INJECT 1 PEN INTO THE SKIN EVERY 14 DAYS   ezetimibe (ZETIA) 10 MG tablet Take 1 tablet (10 mg total) by mouth daily.   lisinopril-hydrochlorothiazide (ZESTORETIC) 20-25 MG tablet Take 0.5 tablets by mouth at bedtime.   Magnesium Cl-Calcium Carbonate (SLOW-MAG PO) Take 1 tablet by mouth 2 (two) times daily.   metoprolol succinate (TOPROL-XL) 25 MG 24 hr tablet Take 1/2 (one-half) tablet by mouth once daily   nitroGLYCERIN (NITROSTAT) 0.4 MG SL tablet Place 0.4 mg under the tongue every 5 (five) minutes as needed for chest pain.     Allergies:   Brilinta [ticagrelor], Codeine, Darvon [propoxyphene], Lipitor [atorvastatin], Rosuvastatin, Simvastatin, Nexlizet [bempedoic acid-ezetimibe], and Tramadol hcl   Social History   Socioeconomic History   Marital status: Married    Spouse name: Meda Coffee   Number of children: Not on file   Years of education: 12   Highest education level: High school graduate  Occupational History   Occupation: Retired  Tobacco Use   Smoking status: Never   Smokeless tobacco: Never  Vaping Use   Vaping Use: Never used  Substance and Sexual Activity   Alcohol use: Yes     Alcohol/week: 4.0 standard drinks    Types: 4 Glasses of wine per week    Comment: glass of wine every other night   Drug use: Never   Sexual activity: Not Currently  Other Topics Concern   Not on file  Social History Narrative   Not on file   Social Determinants of Health   Financial Resource Strain: Not on file  Food Insecurity: Not on file  Transportation Needs: Not on file  Physical Activity: Not on file  Stress: Not on file  Social Connections: Not on file     Family History: The patient's family history includes CVA in her mother; Cancer in her brother; Emphysema (age of onset: 85) in her father.  ROS:   Please see the history of present illness.    Claudication is resolved.  Shortness of breath continues to be an issue.  All  other systems reviewed and are negative.  EKGs/Labs/Other Studies Reviewed:    The following studies were reviewed today: PA and lateral chest x-ray October 05, 2021: FINDINGS: The heart size and mediastinal contours are within normal limits. Normal pulmonary vascularity. No focal consolidation, pleural effusion, or pneumothorax. The lungs remain hyperinflated. No acute osseous abnormality.   IMPRESSION: 1. No acute cardiopulmonary disease.  2D Doppler echocardiogram 09/29/2021: IMPRESSIONS     1. Left ventricular ejection fraction, by estimation, is 60 to 65%. The  left ventricle has normal function. The left ventricle has no regional  wall motion abnormalities. There is mild left ventricular hypertrophy.  Left ventricular diastolic parameters  were normal.   2. Right ventricular systolic function is normal. The right ventricular  size is normal. There is mildly elevated pulmonary artery systolic  pressure.   3. The mitral valve is normal in structure. Mild mitral valve  regurgitation.   4. The aortic valve is calcified. Aortic valve regurgitation is mild.  Mild aortic valve stenosis.   5. The inferior vena cava is normal in size  with greater than 50%  respiratory variability, suggesting right atrial pressure of 3 mmHg.   Conclusion(s)/Recommendation(s): Compared to prior echocardiogram  04/27/2017 no significant changes.    EKG:  EKG did not perform  Recent Labs: 08/14/2021: NT-Pro BNP 645; TSH 1.750 10/05/2021: B Natriuretic Peptide 53.3 10/06/2021: ALT 14; BUN 31; Creatinine, Ser 1.10; Hemoglobin 11.4; Magnesium 2.1; Platelets 215; Potassium 4.8; Sodium 133  Recent Lipid Panel    Component Value Date/Time   CHOL 214 (H) 09/29/2021 0901   TRIG 170 (H) 10/05/2021 1807   HDL 38 (L) 09/29/2021 0901   CHOLHDL 5.6 (H) 09/29/2021 0901   LDLCALC 140 (H) 09/29/2021 0901    Physical Exam:    VS:  BP (!) 122/58   Pulse 75   Ht 4\' 11"  (1.499 m)   Wt 107 lb (48.5 kg)   SpO2 98%   BMI 21.61 kg/m     Wt Readings from Last 3 Encounters:  11/13/21 107 lb (48.5 kg)  10/05/21 107 lb (48.5 kg)  08/18/21 112 lb (50.8 kg)     GEN: Spry and appearing younger than age.. No acute distress HEENT: Normal NECK: No JVD. LYMPHATICS: No lymphadenopathy CARDIAC: No murmur. RRR no gallop, or edema. VASCULAR:  Normal Pulses. No bruits. RESPIRATORY:  Clear to auscultation without rales, wheezing or rhonchi  ABDOMEN: Soft, non-tender, non-distended, No pulsatile mass, MUSCULOSKELETAL: No deformity  SKIN: Warm and dry NEUROLOGIC:  Alert and oriented x 3 PSYCHIATRIC:  Normal affect   ASSESSMENT:    1. DOE (dyspnea on exertion)   2. CAD in native artery   3. Essential hypertension   4. Mixed hyperlipidemia   5. Pulmonary hypertension, unspecified (HCC)   6. Dilated aortic root (HCC)    PLAN:    In order of problems listed above:  Etiology is unclear to me.  She carries his history of COPD but the recent chest x-ray shows no evidence of that.  Recent echo does not demonstrate evidence of diastolic abnormality.  Systolic function is normal.  We need to determine if there is a pulmonary problem.  Perhaps seeing a  pulmonologist would be helpful.  I considered a trial of SGLT2 therapy to treat on recognized diastolic heart failure however decided against it as it would require an additional medication that could cause trouble without clinical data strongly supporting the presence. Secondary prevention discussed Blood pressure control is excellent Continue Praluent No  clinical evidence of significant pulmonary hypertension  Overall education and awareness concerning primary/secondary risk prevention was discussed in detail: LDL less than 70, hemoglobin A1c less than 7, blood pressure target less than 130/80 mmHg, >150 minutes of moderate aerobic activity per week, avoidance of smoking, weight control (via diet and exercise), and continued surveillance/management of/for obstructive sleep apnea.    Medication Adjustments/Labs and Tests Ordered: Current medicines are reviewed at length with the patient today.  Concerns regarding medicines are outlined above.  No orders of the defined types were placed in this encounter.  No orders of the defined types were placed in this encounter.   Patient Instructions  Medication Instructions:  Your physician recommends that you continue on your current medications as directed. Please refer to the Current Medication list given to you today.  *If you need a refill on your cardiac medications before your next appointment, please call your pharmacy*   Lab Work: None If you have labs (blood work) drawn today and your tests are completely normal, you will receive your results only by: [place] (if you have MyChart) OR A paper copy in the mail If you have any lab test that is abnormal or we need to change your treatment, we will call you to review the results.   Testing/Procedures: None   Follow-Up: At [facility], you and your health needs are our priority.  As part of our continuing mission to provide you with exceptional heart care, we have created  designated Provider Care Teams.  These Care Teams include your primary Cardiologist (physician) and Advanced Practice Providers (APPs -  Physician Assistants and Nurse Practitioners) who all work together to provide you with the care you need, when you need it.  We recommend signing up for the patient portal called "MyChart".  Sign up information is provided on this After Visit Summary.  MyChart is used to connect with patients for Virtual Visits (Telemedicine).  Patients are able to view lab/test results, encounter notes, upcoming appointments, etc.  Non-urgent messages can be sent to your provider as well.   To learn more about what you can do with MyChart, go to [email or web address].    Your next appointment:   6-9 month(s)  The format for your next appointment:   In Person  Provider:   Sinclair Grooms, MD     Other Instructions     Signed, Sinclair Grooms, MD  11/13/2021 1:49 PM    [place]

## 2021-11-13 ENCOUNTER — Other Ambulatory Visit: Payer: Self-pay

## 2021-11-13 ENCOUNTER — Encounter: Payer: Self-pay | Admitting: Interventional Cardiology

## 2021-11-13 ENCOUNTER — Ambulatory Visit: Payer: Medicare Other | Admitting: Interventional Cardiology

## 2021-11-13 VITALS — BP 122/58 | HR 75 | Ht 59.0 in | Wt 107.0 lb

## 2021-11-13 DIAGNOSIS — I272 Pulmonary hypertension, unspecified: Secondary | ICD-10-CM

## 2021-11-13 DIAGNOSIS — E782 Mixed hyperlipidemia: Secondary | ICD-10-CM

## 2021-11-13 DIAGNOSIS — I2510 Atherosclerotic heart disease of native coronary artery without angina pectoris: Secondary | ICD-10-CM

## 2021-11-13 DIAGNOSIS — I10 Essential (primary) hypertension: Secondary | ICD-10-CM

## 2021-11-13 DIAGNOSIS — R0609 Other forms of dyspnea: Secondary | ICD-10-CM

## 2021-11-13 DIAGNOSIS — I77810 Thoracic aortic ectasia: Secondary | ICD-10-CM

## 2021-11-13 DIAGNOSIS — E785 Hyperlipidemia, unspecified: Secondary | ICD-10-CM

## 2021-11-13 NOTE — Patient Instructions (Signed)
Medication Instructions:  Your physician recommends that you continue on your current medications as directed. Please refer to the Current Medication list given to you today.  *If you need a refill on your cardiac medications before your next appointment, please call your pharmacy*   Lab Work: None If you have labs (blood work) drawn today and your tests are completely normal, you will receive your results only by: MyChart Message (if you have MyChart) OR A paper copy in the mail If you have any lab test that is abnormal or we need to change your treatment, we will call you to review the results.   Testing/Procedures: None   Follow-Up: At [facility], you and your health needs are our priority.  As part of our continuing mission to provide you with exceptional heart care, we have created designated Provider Care Teams.  These Care Teams include your primary Cardiologist (physician) and Advanced Practice Providers (APPs -  Physician Assistants and Nurse Practitioners) who all work together to provide you with the care you need, when you need it.  We recommend signing up for the patient portal called "MyChart".  Sign up information is provided on this After Visit Summary.  MyChart is used to connect with patients for Virtual Visits (Telemedicine).  Patients are able to view lab/test results, encounter notes, upcoming appointments, etc.  Non-urgent messages can be sent to your provider as well.   To learn more about what you can do with MyChart, go to [email or web address].    Your next appointment:   6-9 month(s)  The format for your next appointment:   In Person  Provider:   [name], MD     Other Instructions   

## 2021-12-01 ENCOUNTER — Ambulatory Visit (INDEPENDENT_AMBULATORY_CARE_PROVIDER_SITE_OTHER)
Admission: RE | Admit: 2021-12-01 | Discharge: 2021-12-01 | Disposition: A | Discharge status: Home / Self Care | Payer: Medicare Other | Source: Ambulatory Visit | Attending: Vascular Surgery | Admitting: Vascular Surgery

## 2021-12-01 ENCOUNTER — Other Ambulatory Visit: Payer: Self-pay

## 2021-12-01 ENCOUNTER — Ambulatory Visit: Payer: Medicare Other | Admitting: Vascular Surgery

## 2021-12-01 ENCOUNTER — Encounter: Payer: Self-pay | Admitting: Vascular Surgery

## 2021-12-01 ENCOUNTER — Ambulatory Visit (HOSPITAL_COMMUNITY)
Admission: RE | Admit: 2021-12-01 | Discharge: 2021-12-01 | Disposition: A | Discharge status: Home / Self Care | Payer: Medicare Other | Source: Ambulatory Visit | Attending: Vascular Surgery | Admitting: Vascular Surgery

## 2021-12-01 VITALS — BP 155/92 | HR 62 | Temp 98.0°F | Resp 14 | Ht 59.5 in | Wt 108.0 lb

## 2021-12-01 DIAGNOSIS — I739 Peripheral vascular disease, unspecified: Secondary | ICD-10-CM | POA: Diagnosis not present

## 2021-12-01 DIAGNOSIS — I70219 Atherosclerosis of native arteries of extremities with intermittent claudication, unspecified extremity: Secondary | ICD-10-CM | POA: Diagnosis not present

## 2021-12-01 NOTE — Progress Notes (Signed)
Patient name: Sydney Gross MRN: [id number] DOB: May 12, 1930 Sex: female  REASON FOR VISIT: Follow-up and ongoing surveillance after left iliac stenting  HPI: Sydney Gross is a 87 y.o. female with multiple medical problems including COPD that presents for hospital follow-up after previous left iliac artery stenting.  She has previously been under the care of Dr. Darrick Penna who just retired from our practice.  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  She has known flush SFA occlusion bilateral as well.    She reports her left leg is doing fine.  She has had no recurrent claudication.  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  She is unclear about her visit today.  Past Medical History:  Diagnosis Date   Allergic rhinitis    Arthritis    "maybe a little in my knees and back" (07/17/2018)   Chronic lower back pain    Colitis 12/2013   "never had this before"   COPD (chronic obstructive pulmonary disease) (H[facility])    "questionable/Dr. Katrinka Blazing" (07/17/2018)   Coronary artery disease    Family history of anesthesia complication    "mother; think they gave her too much; c/o pain after OR; gave her more RX; they had to bring her back real quick"   GI bleed    H/O calcium pyrophosphate deposition disease (CPPD)    Heart murmur    Hiatal hernia    Hyperlipidemia    Hypertension    Mild mitral regurgitation    Numbness in feet    On home oxygen therapy    "I've used it 2 times in 2 yrs" (07/17/2018)   PAD (peripheral artery disease) (HCC)    Proteinuria    Pulmonary hypertension (HCC)    Shingles    Stenosis of tear duct     Past Surgical History:  Procedure Laterality Date   ABDOMINAL AORTOGRAM W/LOWER EXTREMITY N/A 07/10/2021   Procedure: ABDOMINAL AORTOGRAM W/LOWER  EXTREMITY;  Surgeon: Sherren Kerns, MD;  Location: [facility];  Service: Cardiovascular;  Laterality: N/A;   ABDOMINAL HYSTERECTOMY  1960's   APPENDECTOMY     BACK SURGERY     BIOPSY  04/20/2021   Procedure: BIOPSY;  Surgeon: Jeani Hawking, MD;  Location: [facility];  Service: Endoscopy;;   BLEPHAROPLASTY Bilateral 05/2017   CARDIAC CATHETERIZATION     CATARACT EXTRACTION, BILATERAL Bilateral    CESAREAN SECTION  1957; 1960   CORONARY STENT INTERVENTION N/A 07/17/2018   Procedure: CORONARY STENT INTERVENTION;  Surgeon: Lyn Records, MD;  Location: [facility];  Service: Cardiovascular;  Laterality: N/A;   DACROCYSTORHINOSTOMY W/ JONES TUBE Bilateral 05/2017   "put tear duct in my eyes"   ESOPHAGOGASTRODUODENOSCOPY (EGD) WITH PROPOFOL N/A 04/20/2021   Procedure: ESOPHAGOGASTRODUODENOSCOPY (EGD) WITH PROPOFOL;  Surgeon: Jeani Hawking, MD;  Location: [facility];  Service: Endoscopy;  Laterality: N/A;   EYE SURGERY     LAPAROSCOPIC CHOLECYSTECTOMY  ~ 2009   LUMBAR DISC SURGERY  ?2004   PERIPHERAL VASCULAR INTERVENTION Left 07/10/2021   Procedure: PERIPHERAL VASCULAR INTERVENTION;  Surgeon: Sherren Kerns, MD;  Location: [facility];  Service: Cardiovascular;  Laterality: Left;  common, external iliac arteries   RIGHT/LEFT HEART CATH AND CORONARY ANGIOGRAPHY N/A 07/17/2018   Procedure: RIGHT/LEFT HEART CATH  AND CORONARY ANGIOGRAPHY;  Surgeon: Belva Crome, MD;  Location: [facility];  Service: Cardiovascular;  Laterality: N/A;   TONSILLECTOMY AND ADENOIDECTOMY  ?75's    Family History  Problem Relation Age of Onset   Emphysema Father 72   CVA Mother    Cancer Brother        ? type    SOCIAL HISTORY: Social History   Tobacco Use   Smoking status: Never   Smokeless tobacco: Never  Substance Use Topics   Alcohol use: Yes    Alcohol/week: 4.0 standard drinks    Types: 4 Glasses of wine per week    Comment: glass of wine every other night     Allergies  Allergen Reactions   Brilinta [Ticagrelor] Shortness Of Breath   Codeine Nausea And Vomiting   Darvon [Propoxyphene] Nausea Only   Lipitor [Atorvastatin] Other (See Comments)    MYALGIAS   Rosuvastatin Other (See Comments)    MYALGIAS    Simvastatin Other (See Comments)    MYALGIAS    Nexlizet [Bempedoic Acid-Ezetimibe]     Muscle aches (tolerates Zetia just fine, allergy is with bempedoic acid component)   Tramadol Hcl Nausea Only    nausea (08/2019)    Current Outpatient Medications  Medication Sig Dispense Refill   acetaminophen (TYLENOL) 650 MG CR tablet Take 1,300 mg by mouth every 8 (eight) hours as needed for pain.     aspirin EC 81 MG tablet Take 1 tablet (81 mg total) by mouth daily. Swallow whole. 90 tablet 3   Budeson-Glycopyrrol-Formoterol (BREZTRI AEROSPHERE) 160-9-4.8 MCG/ACT AERO Inhale 2 puffs into the lungs in the morning and at bedtime.     denosumab (PROLIA) 60 MG/ML SOSY injection Inject 60 mg as directed every 6 (six) months.     diphenhydramine-acetaminophen (TYLENOL PM) 25-500 MG TABS tablet Take 1 tablet by mouth at bedtime.     Evolocumab (REPATHA SURECLICK) [id number] MG/ML SOAJ INJECT 1 PEN INTO THE SKIN EVERY 14 DAYS 6 mL 3   ezetimibe (ZETIA) 10 MG tablet Take 1 tablet (10 mg total) by mouth daily. 30 tablet 5   lisinopril-hydrochlorothiazide (ZESTORETIC) 20-25 MG tablet Take 0.5 tablets by mouth at bedtime. 45 tablet 3   Magnesium Cl-Calcium Carbonate (SLOW-MAG PO) Take 1 tablet by mouth 2 (two) times daily.     metoprolol succinate (TOPROL-XL) 25 MG 24 hr tablet Take 1/2 (one-half) tablet by mouth once daily 45 tablet 2   nitroGLYCERIN (NITROSTAT) 0.4 MG SL tablet Place 0.4 mg under the tongue every 5 (five) minutes as needed for chest pain.     No current facility-administered medications for this visit.    REVIEW OF SYSTEMS:  [X]  denotes positive finding, [ ]  denotes negative finding Cardiac  Comments:  Chest pain or chest pressure:     Shortness of breath upon exertion:    Short of breath when lying flat:    Irregular heart rhythm:        Vascular    Pain in calf, thigh, or hip brought on by ambulation:    Pain in feet at night that wakes you up from your sleep:     Blood clot in your veins:    Leg swelling:         Pulmonary    Oxygen at home:    Productive cough:     Wheezing:         Neurologic    Sudden weakness in arms or legs:  Sudden numbness in arms or legs:     Sudden onset of difficulty speaking or slurred speech:    Temporary loss of vision in one eye:     Problems with dizziness:         Gastrointestinal    Blood in stool:     Vomited blood:         Genitourinary    Burning when urinating:     Blood in urine:        Psychiatric    Major depression:         Hematologic    Bleeding problems:    Problems with blood clotting too easily:        Skin    Rashes or ulcers:        Constitutional    Fever or chills:      PHYSICAL EXAM: Vitals:   12/01/21 0952  BP: (!) 155/92  Pulse: 62  Resp: 14  Temp: 98 F (36.7 C)  TempSrc: Temporal  SpO2: 95%  Weight: 108 lb (49 kg)  Height: 4' 11.5" (1.511 m)    GENERAL: The patient is a well-nourished female, in no acute distress. The vital signs are documented above. CARDIAC: There is a regular rate and rhythm.  VASCULAR:  Bilateral femoral pulses palpable PULMONARY: No respiratory distress. ABDOMEN: Soft and non-tender. MUSCULOSKELETAL: There are no major deformities or cyanosis. NEUROLOGIC: No focal weakness or paresthesias are detected.   DATA:   ABIs today 0.8 on the right monophasic and 0.76 on the left monophasic  Left iliac stent with some elevated velocities 254 and 274, but patent  Assessment/Plan:  87 year old female presents for continued follow-up after left iliac stenting by Dr. Oneida Alar.  She is having no symptoms today and continues to have a good left femoral pulse.  ABIs are improved..  She does have some  elevated velocities in the left iliac stent and discussed we will continue surveillance of this given no left leg symptoms.  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  Appears to be from focal dissection vs penetrating ulcer.  We will see her in 6 months with ABIs and aortoiliac duplex.  Discussed she call with questions or concerns.  Marty Heck, MD [facility] Office: [phone]

## 2021-12-02 ENCOUNTER — Other Ambulatory Visit: Payer: Self-pay

## 2021-12-02 DIAGNOSIS — I70219 Atherosclerosis of native arteries of extremities with intermittent claudication, unspecified extremity: Secondary | ICD-10-CM

## 2021-12-14 DIAGNOSIS — Z0000 Encounter for general adult medical examination without abnormal findings: Secondary | ICD-10-CM | POA: Diagnosis not present

## 2021-12-14 DIAGNOSIS — L299 Pruritus, unspecified: Secondary | ICD-10-CM | POA: Diagnosis not present

## 2021-12-14 DIAGNOSIS — I10 Essential (primary) hypertension: Secondary | ICD-10-CM | POA: Diagnosis not present

## 2021-12-14 DIAGNOSIS — I739 Peripheral vascular disease, unspecified: Secondary | ICD-10-CM | POA: Diagnosis not present

## 2021-12-14 DIAGNOSIS — E782 Mixed hyperlipidemia: Secondary | ICD-10-CM | POA: Diagnosis not present

## 2021-12-14 DIAGNOSIS — D692 Other nonthrombocytopenic purpura: Secondary | ICD-10-CM | POA: Diagnosis not present

## 2021-12-14 DIAGNOSIS — J449 Chronic obstructive pulmonary disease, unspecified: Secondary | ICD-10-CM | POA: Diagnosis not present

## 2022-01-05 ENCOUNTER — Other Ambulatory Visit: Payer: Self-pay | Admitting: Interventional Cardiology

## 2022-01-10 ENCOUNTER — Other Ambulatory Visit: Payer: Self-pay | Admitting: Interventional Cardiology

## 2022-01-13 ENCOUNTER — Other Ambulatory Visit: Payer: Self-pay | Admitting: Interventional Cardiology

## 2022-01-21 ENCOUNTER — Other Ambulatory Visit: Payer: Self-pay | Admitting: Interventional Cardiology

## 2022-01-25 ENCOUNTER — Other Ambulatory Visit: Payer: Self-pay | Admitting: Interventional Cardiology

## 2022-02-11 DIAGNOSIS — M25552 Pain in left hip: Secondary | ICD-10-CM | POA: Diagnosis not present

## 2022-02-11 DIAGNOSIS — M545 Low back pain, unspecified: Secondary | ICD-10-CM | POA: Diagnosis not present

## 2022-02-25 DIAGNOSIS — M545 Low back pain, unspecified: Secondary | ICD-10-CM | POA: Diagnosis not present

## 2022-02-25 DIAGNOSIS — M25552 Pain in left hip: Secondary | ICD-10-CM | POA: Diagnosis not present

## 2022-02-26 ENCOUNTER — Other Ambulatory Visit: Payer: Self-pay | Admitting: Orthopedic Surgery

## 2022-02-26 DIAGNOSIS — R1032 Left lower quadrant pain: Secondary | ICD-10-CM

## 2022-03-19 ENCOUNTER — Ambulatory Visit
Admission: RE | Admit: 2022-03-19 | Discharge: 2022-03-19 | Disposition: A | Discharge status: Home / Self Care | Payer: Medicare Other | Source: Ambulatory Visit | Attending: Orthopedic Surgery | Admitting: Orthopedic Surgery

## 2022-03-19 DIAGNOSIS — R1032 Left lower quadrant pain: Secondary | ICD-10-CM

## 2022-03-23 ENCOUNTER — Telehealth: Payer: Self-pay

## 2022-03-23 NOTE — Telephone Encounter (Signed)
Pts daughter called.  Patient toes have been purple since last Thurs night.  One toe is bright red.  She wants patient to be seen.  Per Gwenette Greet, [place], tell pt to come in with ABI and TBIs.  We will call to schedule ASAP. ?

## 2022-03-24 ENCOUNTER — Other Ambulatory Visit: Payer: Self-pay | Admitting: Family

## 2022-03-24 DIAGNOSIS — E785 Hyperlipidemia, unspecified: Secondary | ICD-10-CM

## 2022-03-24 DIAGNOSIS — I25118 Atherosclerotic heart disease of native coronary artery with other forms of angina pectoris: Secondary | ICD-10-CM

## 2022-04-25 ENCOUNTER — Other Ambulatory Visit: Payer: Self-pay | Admitting: Interventional Cardiology

## 2022-04-29 ENCOUNTER — Other Ambulatory Visit: Payer: Self-pay | Admitting: Interventional Cardiology

## 2022-05-18 ENCOUNTER — Telehealth: Payer: Self-pay

## 2022-05-18 NOTE — Telephone Encounter (Signed)
I spoke with pt in regards to her recall appt listed in our system. Pt reports she is still having discoloration of her feet and legs. She also has swelling. Pt denies having LE rest pains or pains with ambulating. Pt scheduled to follow up with Dr.Clark and U/S prior to her OV. Pt voiced her understanding.

## 2022-05-21 ENCOUNTER — Ambulatory Visit (INDEPENDENT_AMBULATORY_CARE_PROVIDER_SITE_OTHER)
Admission: RE | Admit: 2022-05-21 | Discharge: 2022-05-21 | Disposition: A | Discharge status: Home / Self Care | Payer: Medicare Other | Source: Ambulatory Visit | Attending: Vascular Surgery | Admitting: Vascular Surgery

## 2022-05-21 ENCOUNTER — Ambulatory Visit (HOSPITAL_COMMUNITY)
Admission: RE | Admit: 2022-05-21 | Discharge: 2022-05-21 | Disposition: A | Discharge status: Home / Self Care | Payer: Medicare Other | Source: Ambulatory Visit | Attending: Vascular Surgery | Admitting: Vascular Surgery

## 2022-05-21 DIAGNOSIS — I70219 Atherosclerosis of native arteries of extremities with intermittent claudication, unspecified extremity: Secondary | ICD-10-CM | POA: Diagnosis not present

## 2022-05-25 ENCOUNTER — Ambulatory Visit: Payer: Medicare Other | Admitting: Vascular Surgery

## 2022-05-25 ENCOUNTER — Encounter: Payer: Self-pay | Admitting: Vascular Surgery

## 2022-05-25 VITALS — BP 152/78 | HR 69 | Temp 97.7°F | Resp 14 | Ht 59.0 in | Wt 110.0 lb

## 2022-05-25 DIAGNOSIS — I70219 Atherosclerosis of native arteries of extremities with intermittent claudication, unspecified extremity: Secondary | ICD-10-CM | POA: Diagnosis not present

## 2022-05-25 NOTE — Progress Notes (Signed)
Patient name: Sydney Gross MRN: [id number] DOB: 1930/02/28 Sex: female  REASON FOR VISIT: Follow-up and ongoing surveillance of left iliac stents  HPI: Sydney Gross is a 88 y.o. female with multiple medical problems including COPD that presents for follow-up and surveillance after previous left iliac artery stenting.  She has previously been under the care of Dr. Darrick Penna who retired from our practice.  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  She has known flush SFA occlusion bilateral as well.  She did really well after stenting and her claudication in the left leg resolved.  She has been concerned about swelling in her bilateral lower extremities.  She has recently started steroids from her orthopedic surgeon.  She denies any lower extremity claudication symptoms.  She is taking aspirin but stopped the Plavix at the direction of Dr. Darrick Penna.  No new complaints today.  Past Medical History:  Diagnosis Date   Allergic rhinitis    Arthritis    "maybe a little in my knees and back" (07/17/2018)   Chronic lower back pain    Colitis 12/2013   "never had this before"   COPD (chronic obstructive pulmonary disease) (HCC)    "questionable/Dr. Katrinka Blazing" (07/17/2018)   Coronary artery disease    Family history of anesthesia complication    "mother; think they gave her too much; c/o pain after OR; gave her more RX; they had to bring her back real quick"   GI bleed    H/O calcium pyrophosphate deposition disease (CPPD)    Heart murmur    Hiatal hernia    Hyperlipidemia    Hypertension    Mild mitral regurgitation    Numbness in feet    On home oxygen therapy    "I've used it 2 times in 2 yrs" (07/17/2018)   PAD (peripheral artery disease) (HCC)    Proteinuria    Pulmonary hypertension (HCC)    Shingles    Stenosis of tear duct     Past Surgical History:  Procedure Laterality Date    ABDOMINAL AORTOGRAM W/LOWER EXTREMITY N/A 07/10/2021   Procedure: ABDOMINAL AORTOGRAM W/LOWER EXTREMITY;  Surgeon: Sherren Kerns, MD;  Location: [facility];  Service: Cardiovascular;  Laterality: N/A;   ABDOMINAL HYSTERECTOMY  1960's   APPENDECTOMY     BACK SURGERY     BIOPSY  04/20/2021   Procedure: BIOPSY;  Surgeon: Jeani Hawking, MD;  Location: [facility];  Service: Endoscopy;;   BLEPHAROPLASTY Bilateral 05/2017   CARDIAC CATHETERIZATION     CATARACT EXTRACTION, BILATERAL Bilateral    CESAREAN SECTION  1957; 1960   CORONARY STENT INTERVENTION N/A 07/17/2018   Procedure: CORONARY STENT INTERVENTION;  Surgeon: Lyn Records, MD;  Location: [facility];  Service: Cardiovascular;  Laterality: N/A;   DACROCYSTORHINOSTOMY W/ JONES TUBE Bilateral 05/2017   "put tear duct in my eyes"   ESOPHAGOGASTRODUODENOSCOPY (EGD) WITH PROPOFOL N/A 04/20/2021   Procedure: ESOPHAGOGASTRODUODENOSCOPY (EGD) WITH PROPOFOL;  Surgeon: Jeani Hawking, MD;  Location: [facility];  Service: Endoscopy;  Laterality: N/A;   EYE SURGERY     LAPAROSCOPIC CHOLECYSTECTOMY  ~ 2009   LUMBAR DISC SURGERY  ?2004   PERIPHERAL VASCULAR INTERVENTION Left 07/10/2021   Procedure: PERIPHERAL VASCULAR INTERVENTION;  Surgeon: Sherren Kerns, MD;  Location: [facility] INV[facility]IVE CV LAB;  Service: Cardiovascular;  Laterality: Left;  common, external iliac arteries   RIGHT/LEFT HEART CATH AND  CORONARY ANGIOGRAPHY N/A 07/17/2018   Procedure: RIGHT/LEFT HEART CATH AND CORONARY ANGIOGRAPHY;  Surgeon: Lyn Records, MD;  Location: [facility] INVASIVE CV LAB;  Service: Cardiovascular;  Laterality: N/A;   TONSILLECTOMY AND ADENOIDECTOMY  ?1940's    Family History  Problem Relation Age of Onset   Emphysema Father 7   CVA Mother    Cancer Brother        ? type    SOCIAL HISTORY: Social History   Tobacco Use   Smoking status: Never   Smokeless tobacco: Never  Substance Use Topics   Alcohol use: Yes    Alcohol/week: 4.0 standard  drinks of alcohol    Types: 4 Glasses of wine per week    Comment: glass of wine every other night    Allergies  Allergen Reactions   Brilinta [Ticagrelor] Shortness Of Breath   Codeine Nausea And Vomiting   Darvon [Propoxyphene] Nausea Only   Lipitor [Atorvastatin] Other (See Comments)    MYALGIAS   Rosuvastatin Other (See Comments)    MYALGIAS    Simvastatin Other (See Comments)    MYALGIAS    Ezetimibe     aches Other reaction(s): aches   Nexlizet [Bempedoic Acid-Ezetimibe]     Muscle aches (tolerates Zetia just fine, allergy is with bempedoic acid component)   Tramadol Hcl Nausea Only    nausea (08/2019)    Current Outpatient Medications  Medication Sig Dispense Refill   acetaminophen (TYLENOL) 650 MG CR tablet Take 1,300 mg by mouth every 8 (eight) hours as needed for pain.     aspirin EC 81 MG tablet Take 1 tablet (81 mg total) by mouth daily. Swallow whole. 90 tablet 3   Budeson-Glycopyrrol-Formoterol (BREZTRI AEROSPHERE) 160-9-4.8 MCG/ACT AERO Inhale 2 puffs into the lungs in the morning and at bedtime.     denosumab (PROLIA) 60 MG/ML SOSY injection Inject 60 mg as directed every 6 (six) months.     diphenhydramine-acetaminophen (TYLENOL PM) 25-500 MG TABS tablet Take 1 tablet by mouth at bedtime.     Evolocumab (REPATHA SURECLICK) 140 MG/ML SOAJ INJECT 1 PEN INTO THE SKIN EVERY 14 DAYS 6 mL 3   ezetimibe (ZETIA) 10 MG tablet Take 1 tablet by mouth once daily 90 tablet 2   lisinopril-hydrochlorothiazide (ZESTORETIC) 20-25 MG tablet Take 0.5 tablets by mouth at bedtime. 45 tablet 3   Magnesium Cl-Calcium Carbonate (SLOW-MAG PO) Take 1 tablet by mouth 2 (two) times daily.     metoprolol succinate (TOPROL-XL) 25 MG 24 hr tablet Take 0.5 tablets (12.5 mg total) by mouth daily. Please call [phone] to schedule an appointment for future refills. Thank you. 1st attempt. 15 tablet 0   nitroGLYCERIN (NITROSTAT) 0.4 MG SL tablet Place 0.4 mg under the tongue every 5 (five)  minutes as needed for chest pain.     predniSONE (DELTASONE) 10 MG tablet Take 10 mg by mouth 2 (two) times daily.     No current facility-administered medications for this visit.    REVIEW OF SYSTEMS:  [X]  denotes positive finding, [ ]  denotes negative finding Cardiac  Comments:  Chest pain or chest pressure:    Shortness of breath upon exertion:    Short of breath when lying flat:    Irregular heart rhythm:        Vascular    Pain in calf, thigh, or hip brought on by ambulation:    Pain in feet at night that wakes you up from your sleep:     Blood clot in  your veins:    Leg swelling:         Pulmonary    Oxygen at home:    Productive cough:     Wheezing:         Neurologic    Sudden weakness in arms or legs:     Sudden numbness in arms or legs:     Sudden onset of difficulty speaking or slurred speech:    Temporary loss of vision in one eye:     Problems with dizziness:         Gastrointestinal    Blood in stool:     Vomited blood:         Genitourinary    Burning when urinating:     Blood in urine:        Psychiatric    Major depression:         Hematologic    Bleeding problems:    Problems with blood clotting too easily:        Skin    Rashes or ulcers:        Constitutional    Fever or chills:      PHYSICAL EXAM: Vitals:   05/25/22 1521  BP: (!) 152/78  Pulse: 69  Resp: 14  Temp: 97.7 F (36.5 C)  TempSrc: Temporal  SpO2: 98%  Weight: 110 lb (49.9 kg)  Height: 4\' 11"  (1.499 m)    GENERAL: The patient is a well-nourished female, in no acute distress. The vital signs are documented above. CARDIAC: There is a regular rate and rhythm.  VASCULAR:  Bilateral femoral pulses palpable No palpable pedal pulses PULMONARY: No respiratory distress. ABDOMEN: Soft and non-tender. MUSCULOSKELETAL: There are no major deformities or cyanosis. NEUROLOGIC: No focal weakness or paresthesias are detected.   DATA:   Left iliac duplex today shows  increased velocity distal to the left common iliac stent of 370 indicating a high-grade stenosis with monophasic flow.  There is also a evidence of a high-grade stenosis proximal to the left external iliac stent with a velocity of 327 with monophasic flow.  ABIs on the left have dropped from 0.76 to 0.68  Assessment/Plan:  88 year old female presents for continued follow-up after left iliac stenting by Dr. Oneida Alar for lifestyle limiting claudication.  Although she does not have any recurrent symptoms her iliac duplex suggest increasing stenosis distal to the left common iliac stent and also an area in the proximal external iliac stent on the left.  Velocities are over 300 with monophasic flow.  She also had a drop in her ABIs.  I have recommended aortogram with possible iliac intervention to maintain primary patency of her stents.  Risk benefits discussed.  We will get this scheduled on 06/17/2022 in the cath lab.  Marty Heck, MD [facility] Office: [phone]

## 2022-05-26 ENCOUNTER — Other Ambulatory Visit: Payer: Self-pay

## 2022-05-26 DIAGNOSIS — I70219 Atherosclerosis of native arteries of extremities with intermittent claudication, unspecified extremity: Secondary | ICD-10-CM

## 2022-06-01 DIAGNOSIS — T148XXA Other injury of unspecified body region, initial encounter: Secondary | ICD-10-CM | POA: Diagnosis not present

## 2022-06-01 DIAGNOSIS — I2510 Atherosclerotic heart disease of native coronary artery without angina pectoris: Secondary | ICD-10-CM | POA: Diagnosis not present

## 2022-06-01 DIAGNOSIS — S8012XA Contusion of left lower leg, initial encounter: Secondary | ICD-10-CM | POA: Diagnosis not present

## 2022-06-01 DIAGNOSIS — I739 Peripheral vascular disease, unspecified: Secondary | ICD-10-CM | POA: Diagnosis not present

## 2022-06-11 ENCOUNTER — Other Ambulatory Visit: Payer: Self-pay | Admitting: Interventional Cardiology

## 2022-06-14 DIAGNOSIS — M810 Age-related osteoporosis without current pathological fracture: Secondary | ICD-10-CM | POA: Diagnosis not present

## 2022-06-14 DIAGNOSIS — I10 Essential (primary) hypertension: Secondary | ICD-10-CM | POA: Diagnosis not present

## 2022-06-14 DIAGNOSIS — E782 Mixed hyperlipidemia: Secondary | ICD-10-CM | POA: Diagnosis not present

## 2022-06-14 DIAGNOSIS — J449 Chronic obstructive pulmonary disease, unspecified: Secondary | ICD-10-CM | POA: Diagnosis not present

## 2022-06-16 ENCOUNTER — Other Ambulatory Visit: Payer: Self-pay

## 2022-06-16 ENCOUNTER — Encounter (HOSPITAL_COMMUNITY): Payer: Self-pay | Admitting: Emergency Medicine

## 2022-06-16 ENCOUNTER — Emergency Department (HOSPITAL_COMMUNITY): Payer: Medicare Other

## 2022-06-16 ENCOUNTER — Observation Stay (HOSPITAL_COMMUNITY)
Admission: EM | Admit: 2022-06-16 | Discharge: 2022-06-18 | Disposition: A | Discharge status: Home / Self Care | Payer: Medicare Other | Attending: Interventional Cardiology | Admitting: Interventional Cardiology

## 2022-06-16 DIAGNOSIS — J449 Chronic obstructive pulmonary disease, unspecified: Secondary | ICD-10-CM | POA: Diagnosis not present

## 2022-06-16 DIAGNOSIS — I4891 Unspecified atrial fibrillation: Principal | ICD-10-CM | POA: Diagnosis present

## 2022-06-16 DIAGNOSIS — R000 Tachycardia, unspecified: Secondary | ICD-10-CM | POA: Diagnosis not present

## 2022-06-16 DIAGNOSIS — I5032 Chronic diastolic (congestive) heart failure: Secondary | ICD-10-CM | POA: Diagnosis not present

## 2022-06-16 DIAGNOSIS — D72829 Elevated white blood cell count, unspecified: Secondary | ICD-10-CM | POA: Diagnosis not present

## 2022-06-16 DIAGNOSIS — I10 Essential (primary) hypertension: Secondary | ICD-10-CM | POA: Diagnosis present

## 2022-06-16 DIAGNOSIS — I110 Hypertensive heart disease with heart failure: Secondary | ICD-10-CM | POA: Insufficient documentation

## 2022-06-16 DIAGNOSIS — Z7982 Long term (current) use of aspirin: Secondary | ICD-10-CM | POA: Insufficient documentation

## 2022-06-16 DIAGNOSIS — E782 Mixed hyperlipidemia: Secondary | ICD-10-CM

## 2022-06-16 DIAGNOSIS — R42 Dizziness and giddiness: Secondary | ICD-10-CM | POA: Diagnosis not present

## 2022-06-16 DIAGNOSIS — N179 Acute kidney failure, unspecified: Secondary | ICD-10-CM

## 2022-06-16 DIAGNOSIS — I959 Hypotension, unspecified: Secondary | ICD-10-CM | POA: Diagnosis not present

## 2022-06-16 DIAGNOSIS — Z79899 Other long term (current) drug therapy: Secondary | ICD-10-CM | POA: Insufficient documentation

## 2022-06-16 DIAGNOSIS — K5792 Diverticulitis of intestine, part unspecified, without perforation or abscess without bleeding: Secondary | ICD-10-CM | POA: Diagnosis present

## 2022-06-16 DIAGNOSIS — I350 Nonrheumatic aortic (valve) stenosis: Secondary | ICD-10-CM | POA: Insufficient documentation

## 2022-06-16 DIAGNOSIS — R0789 Other chest pain: Secondary | ICD-10-CM | POA: Diagnosis not present

## 2022-06-16 DIAGNOSIS — R079 Chest pain, unspecified: Secondary | ICD-10-CM | POA: Diagnosis not present

## 2022-06-16 DIAGNOSIS — Q253 Supravalvular aortic stenosis: Secondary | ICD-10-CM

## 2022-06-16 DIAGNOSIS — I25118 Atherosclerotic heart disease of native coronary artery with other forms of angina pectoris: Secondary | ICD-10-CM | POA: Diagnosis present

## 2022-06-16 DIAGNOSIS — I739 Peripheral vascular disease, unspecified: Secondary | ICD-10-CM | POA: Diagnosis present

## 2022-06-16 DIAGNOSIS — E785 Hyperlipidemia, unspecified: Secondary | ICD-10-CM | POA: Diagnosis present

## 2022-06-16 DIAGNOSIS — R778 Other specified abnormalities of plasma proteins: Secondary | ICD-10-CM | POA: Diagnosis not present

## 2022-06-16 DIAGNOSIS — I480 Paroxysmal atrial fibrillation: Secondary | ICD-10-CM

## 2022-06-16 DIAGNOSIS — J9811 Atelectasis: Secondary | ICD-10-CM | POA: Diagnosis not present

## 2022-06-16 LAB — BASIC METABOLIC PANEL
Anion gap: 12 (ref 5–15)
BUN: 33 mg/dL — ABNORMAL HIGH (ref 8–23)
CO2: 21 mmol/L — ABNORMAL LOW (ref 22–32)
Calcium: 9.4 mg/dL (ref 8.9–10.3)
Chloride: 109 mmol/L (ref 98–111)
Creatinine, Ser: 1.57 mg/dL — ABNORMAL HIGH (ref 0.44–1.00)
GFR, Estimated: 31 mL/min — ABNORMAL LOW (ref >60)
Glucose, Bld: 149 mg/dL — ABNORMAL HIGH (ref 70–99)
Potassium: 4.7 mmol/L (ref 3.5–5.1)
Sodium: 142 mmol/L (ref 135–145)

## 2022-06-16 LAB — CBC WITH DIFFERENTIAL/PLATELET
Abs Immature Granulocytes: 0.07 10*3/uL (ref 0.00–0.07)
Basophils Absolute: 0 10*3/uL (ref 0.0–0.1)
Basophils Relative: 0 %
Eosinophils Absolute: 0.2 10*3/uL (ref 0.0–0.5)
Eosinophils Relative: 2 %
HCT: 35.3 % — ABNORMAL LOW (ref 36.0–46.0)
Hemoglobin: 11.5 g/dL — ABNORMAL LOW (ref 12.0–15.0)
Immature Granulocytes: 1 %
Lymphocytes Relative: 23 %
Lymphs Abs: 2.5 10*3/uL (ref 0.7–4.0)
MCH: 33.9 pg (ref 26.0–34.0)
MCHC: 32.6 g/dL (ref 30.0–36.0)
MCV: 104.1 fL — ABNORMAL HIGH (ref 80.0–100.0)
Monocytes Absolute: 0.8 10*3/uL (ref 0.1–1.0)
Monocytes Relative: 7 %
Neutro Abs: 7.2 10*3/uL (ref 1.7–7.7)
Neutrophils Relative %: 67 %
Platelets: 202 10*3/uL (ref 150–400)
RBC: 3.39 MIL/uL — ABNORMAL LOW (ref 3.87–5.11)
RDW: 13.5 % (ref 11.5–15.5)
WBC: 10.7 10*3/uL — ABNORMAL HIGH (ref 4.0–10.5)
nRBC: 0 % (ref 0.0–0.2)

## 2022-06-16 LAB — I-STAT CHEM 8, ED
BUN: 32 mg/dL — ABNORMAL HIGH (ref 8–23)
Calcium, Ion: 1.21 mmol/L (ref 1.15–1.40)
Chloride: 108 mmol/L (ref 98–111)
Creatinine, Ser: 1.6 mg/dL — ABNORMAL HIGH (ref 0.44–1.00)
Glucose, Bld: 146 mg/dL — ABNORMAL HIGH (ref 70–99)
HCT: 34 % — ABNORMAL LOW (ref 36.0–46.0)
Hemoglobin: 11.6 g/dL — ABNORMAL LOW (ref 12.0–15.0)
Potassium: 4.5 mmol/L (ref 3.5–5.1)
Sodium: 140 mmol/L (ref 135–145)
TCO2: 21 mmol/L — ABNORMAL LOW (ref 22–32)

## 2022-06-16 LAB — MAGNESIUM: Magnesium: 1.9 mg/dL (ref 1.7–2.4)

## 2022-06-16 LAB — TROPONIN I (HIGH SENSITIVITY)
Troponin I (High Sensitivity): 37 ng/L — ABNORMAL HIGH (ref <18)
Troponin I (High Sensitivity): 96 ng/L — ABNORMAL HIGH (ref <18)

## 2022-06-16 MED ORDER — METOPROLOL SUCCINATE ER 25 MG PO TB24: 12.5000 mg | ORAL_TABLET | Freq: Every day | ORAL | Status: DC

## 2022-06-16 MED ORDER — MAGNESIUM CHLORIDE 64 MG PO TBEC
1.0000 | DELAYED_RELEASE_TABLET | Freq: Two times a day (BID) | ORAL | Status: DC
  Administered 2022-06-17 – 2022-06-18 (×3): 64 mg via ORAL
  Filled 2022-06-16 (×5): qty 1

## 2022-06-16 MED ORDER — ACETAMINOPHEN 325 MG PO TABS
650.0000 mg | ORAL_TABLET | ORAL | Status: DC | PRN
  Administered 2022-06-16: 650 mg via ORAL
  Filled 2022-06-16: qty 2

## 2022-06-16 MED ORDER — ONDANSETRON HCL 4 MG/2ML IJ SOLN
4.0000 mg | Freq: Four times a day (QID) | INTRAMUSCULAR | Status: DC | PRN
  Administered 2022-06-17 – 2022-06-18 (×2): 4 mg via INTRAVENOUS
  Filled 2022-06-16 (×3): qty 2

## 2022-06-16 MED ORDER — DILTIAZEM HCL-DEXTROSE 125-5 MG/125ML-% IV SOLN (PREMIX)
5.0000 mg/h | INTRAVENOUS | Status: DC
  Administered 2022-06-16: 5 mg/h via INTRAVENOUS
  Filled 2022-06-16: qty 125

## 2022-06-16 MED ORDER — ASPIRIN 81 MG PO TBEC
81.0000 mg | DELAYED_RELEASE_TABLET | Freq: Every day | ORAL | Status: DC
Start: 2022-06-16 — End: 2022-06-16

## 2022-06-16 MED ORDER — NITROGLYCERIN 0.4 MG SL SUBL: 0.4000 mg | SUBLINGUAL_TABLET | SUBLINGUAL | Status: DC | PRN

## 2022-06-16 MED ORDER — DILTIAZEM LOAD VIA INFUSION
10.0000 mg | Freq: Once | INTRAVENOUS | Status: AC
  Administered 2022-06-16: 10 mg via INTRAVENOUS
  Filled 2022-06-16: qty 10

## 2022-06-16 MED ORDER — APIXABAN 2.5 MG PO TABS
2.5000 mg | ORAL_TABLET | Freq: Two times a day (BID) | ORAL | Status: DC
  Administered 2022-06-16: 2.5 mg via ORAL
  Filled 2022-06-16 (×2): qty 1

## 2022-06-16 MED ORDER — SODIUM CHLORIDE 0.9 % IV BOLUS
1000.0000 mL | Freq: Once | INTRAVENOUS | Status: AC
  Administered 2022-06-16: 1000 mL via INTRAVENOUS

## 2022-06-16 MED ORDER — EZETIMIBE 10 MG PO TABS
10.0000 mg | ORAL_TABLET | Freq: Every day | ORAL | Status: DC
Start: 2022-06-16 — End: 2022-06-16

## 2022-06-16 MED ORDER — ACETAMINOPHEN 325 MG PO TABS: 650.0000 mg | ORAL_TABLET | Freq: Four times a day (QID) | ORAL | Status: DC | PRN

## 2022-06-16 MED ORDER — METOPROLOL TARTRATE 5 MG/5ML IV SOLN
2.5000 mg | Freq: Once | INTRAVENOUS | Status: AC
  Administered 2022-06-16: 2.5 mg via INTRAVENOUS
  Filled 2022-06-16: qty 5

## 2022-06-16 MED ORDER — SODIUM CHLORIDE 0.9 % IV SOLN: INTRAVENOUS | Status: DC

## 2022-06-16 MED ORDER — ACETAMINOPHEN 650 MG RE SUPP: 650.0000 mg | Freq: Four times a day (QID) | RECTAL | Status: DC | PRN

## 2022-06-16 MED ORDER — ASPIRIN 81 MG PO CHEW
324.0000 mg | CHEWABLE_TABLET | Freq: Once | ORAL | Status: AC
  Administered 2022-06-16: 324 mg via ORAL
  Filled 2022-06-16: qty 4

## 2022-06-16 MED ORDER — SODIUM CHLORIDE 0.9% FLUSH: 3.0000 mL | Freq: Two times a day (BID) | INTRAVENOUS | Status: DC

## 2022-06-16 MED ORDER — POLYETHYLENE GLYCOL 3350 17 G PO PACK: 17.0000 g | PACK | Freq: Every day | ORAL | Status: DC | PRN

## 2022-06-16 MED ORDER — BUDESON-GLYCOPYRROL-FORMOTEROL 160-9-4.8 MCG/ACT IN AERO: 2.0000 | INHALATION_SPRAY | Freq: Two times a day (BID) | RESPIRATORY_TRACT | Status: DC | PRN

## 2022-06-16 MED ORDER — APIXABAN 2.5 MG PO TABS: 2.5000 mg | ORAL_TABLET | Freq: Two times a day (BID) | ORAL | Status: DC

## 2022-06-16 NOTE — ED Triage Notes (Signed)
Pt BIB [facility] from home due to having mid chest pain with SHOB.  Husband took her to fire station, afib RVR.  Rate 150-200.  Pt states she might have a hx of afib but unsure.  She states she does take blood thinners but is unsure of which one.  She was supposed to have a stent placed tomorrow 06/17/22 here in [facility] at 0530.  18g in right AC.  NS.  10mg  of cardizem administered.  VS BP 90/60

## 2022-06-16 NOTE — Progress Notes (Signed)
I was initially consulted for admission for this patient.  I was later informed patient will be admitted by cardiology service.  Initial orders have been placed in anticipation for admission but these have been discontinued as I did not evaluate patient and patient is to be evaluated by and admitted by cardiology service.

## 2022-06-16 NOTE — ED Notes (Signed)
Critical troponin 96. Katrinka Blazing MD made aware

## 2022-06-16 NOTE — H&P (Addendum)
Cardiology Admission History and Physical:   Patient ID: Sydney Gross MRN: [id number]; DOB: 1930/01/03   Admission date: 06/16/2022  PCP:  Mayra Neer, MD   [facility] Providers Cardiologist:  Sinclair Grooms, MD   {   Chief Complaint:  palpitations with associated chest discomfort  Patient Profile:   Sydney Gross is a 88 y.o. female with a history of CAD s/p DES to LAD and diagonal 2019, PAD, ectatic abdominal aorta, COPD with mild pulmonary hypertension, hypertension, hyperlipidemia, upper GI bleed, and arthritis who is being seen for the evaluation of new onset atrial fibrillation with RVR.  History of Present Illness:   Sydney Gross is an 88 year old female with the above history who is followed by Dr. Tamala Julian.  She has been doing well from a cardiac standpoint.  She developed some pain that was described as a Oceanographer" in her left side about 6 to 8 weeks ago.  She saw her PCP for this who did not feel like it was cardiac in nature and referred her to Ortho.  Ortho did not feel like it was arthritis but did prescribe prednisone.  Initial plan was to only continue this for about 10 days; however, patient felt significantly better on the prednisone and asked for this to be continued for longer.  She had some lower extremity swelling (mostly in her feet) on the prednisone but otherwise tolerated it well.  She was finally weaned off of this 4 to 5 days ago.  She was in her usual state of health until today when she went to get a cold slushy after lunch and after drinking this all of a sudden developed some chest soreness with palpitations and significant fatigue.  She ultimately went to the fire station where she was found to be in rapid atrial fibrillation with rates in the 150s to 200s.  911 was called and she was transported to the ED.  Otherwise, she has been doing well. No shortness of breath or orthopnea.   Upon arrival to the ED, patient tachycardic and BP soft at times.  Initial EKG looks like SVT but she was noted to be going back and forth between SVT and atrial fibrillation on telemetry. Initial high-sensitivity troponin 37. Chest x-ray showed mild bibasilar atelectasis but otherwise clear. WBC 10.7, Hgb 11.5, Plts 202. Na 142, K 4.7, Glucose 149, Glucose 149, BUN 33, Cr 1.57. Magnesium 1.9. Patient was given a 1L fluid bolus and 2 dose of IV Lopressor with improvement in rates.  Of note, she has a history of PAD with prior stenting of left common iliac artery and left external iliac artery in 07/2021. She was recently seen by Dr. Carlis Abbott on 05/25/2022 and ABIs were noted to have dropped on the left from 0.76 to 0.68. She denied any claudication but repeat peripheral angiogram was recommended. This is scheduled for tomorrow but will now need to be postponed.   Past Medical History:  Diagnosis Date   Allergic rhinitis    Arthritis    "maybe a little in my knees and back" (07/17/2018)   Chronic lower back pain    Colitis 12/2013   "never had this before"   COPD (chronic obstructive pulmonary disease) ([place])    "questionable/Dr. Tamala Julian" (07/17/2018)   Coronary artery disease    Family history of anesthesia complication    "mother; think they gave her too much; c/o pain after OR; gave her more RX; they had to bring her back real quick"  GI bleed    H/O calcium pyrophosphate deposition disease (CPPD)    Heart murmur    Hiatal hernia    Hyperlipidemia    Hypertension    Mild mitral regurgitation    Numbness in feet    On home oxygen therapy    "I've used it 2 times in 2 yrs" (07/17/2018)   PAD (peripheral artery disease) (HCC)    Proteinuria    Pulmonary hypertension (HCC)    Shingles    Stenosis of tear duct     Past Surgical History:  Procedure Laterality Date   ABDOMINAL AORTOGRAM W/LOWER EXTREMITY N/A 07/10/2021   Procedure: ABDOMINAL AORTOGRAM W/LOWER EXTREMITY;  Surgeon: Elam Dutch, MD;  Location: [place] [facility];  Service: Cardiovascular;   Laterality: N/A;   ABDOMINAL HYSTERECTOMY  1960's   APPENDECTOMY     BACK SURGERY     BIOPSY  04/20/2021   Procedure: BIOPSY;  Surgeon: Carol Ada, MD;  Location: [facility];  Service: Endoscopy;;   BLEPHAROPLASTY Bilateral 05/2017   CARDIAC CATHETERIZATION     CATARACT EXTRACTION, BILATERAL Bilateral    CESAREAN SECTION  1957; 1960   CORONARY STENT INTERVENTION N/A 07/17/2018   Procedure: CORONARY STENT INTERVENTION;  Surgeon: Belva Crome, MD;  Location: [place] [facility];  Service: Cardiovascular;  Laterality: N/A;   DACROCYSTORHINOSTOMY W/ JONES TUBE Bilateral 05/2017   "put tear duct in my eyes"   ESOPHAGOGASTRODUODENOSCOPY (EGD) WITH PROPOFOL N/A 04/20/2021   Procedure: ESOPHAGOGASTRODUODENOSCOPY (EGD) WITH PROPOFOL;  Surgeon: Carol Ada, MD;  Location: [facility];  Service: Endoscopy;  Laterality: N/A;   EYE SURGERY     LAPAROSCOPIC CHOLECYSTECTOMY  ~ 2009   LUMBAR DISC SURGERY  ?2004   PERIPHERAL VASCULAR INTERVENTION Left 07/10/2021   Procedure: PERIPHERAL VASCULAR INTERVENTION;  Surgeon: Elam Dutch, MD;  Location: [place] [facility];  Service: Cardiovascular;  Laterality: Left;  common, external iliac arteries   RIGHT/LEFT HEART CATH AND CORONARY ANGIOGRAPHY N/A 07/17/2018   Procedure: RIGHT/LEFT HEART CATH AND CORONARY ANGIOGRAPHY;  Surgeon: Belva Crome, MD;  Location: [place] [facility];  Service: Cardiovascular;  Laterality: N/A;   TONSILLECTOMY AND ADENOIDECTOMY  ?1940's     Medications Prior to Admission: Prior to Admission medications   Medication Sig Start Date End Date Taking? Authorizing Provider  aspirin EC 81 MG tablet Take 1 tablet (81 mg total) by mouth daily. Swallow whole. 05/12/21  Yes Loel Dubonnet, NP  acetaminophen (TYLENOL) 650 MG CR tablet Take 650-1,300 mg by mouth every 8 (eight) hours as needed for pain.    [provider]  Budeson-Glycopyrrol-Formoterol (BREZTRI AEROSPHERE) 160-9-4.8 MCG/ACT AERO Inhale 2 puffs into the  lungs 2 (two) times daily as needed (respiratory issues.). 11/15/19   [provider]  denosumab (PROLIA) 60 MG/ML SOSY injection Inject 60 mg as directed every 6 (six) months.    [provider]  diphenhydramine-acetaminophen (TYLENOL PM) 25-500 MG TABS tablet Take 1 tablet by mouth at bedtime.    [provider]  docusate sodium (COLACE) 100 MG capsule Take 200 mg by mouth 2 (two) times daily.    [provider]  Evolocumab (REPATHA SURECLICK) 299 MG/ML SOAJ INJECT 1 PEN INTO THE SKIN EVERY 14 DAYS 06/26/21   Belva Crome, MD  ezetimibe (ZETIA) 10 MG tablet Take 1 tablet by mouth once daily Patient taking differently: Take 10 mg by mouth at bedtime. 03/24/22   Belva Crome, MD  lisinopril-hydrochlorothiazide (ZESTORETIC) 20-25 MG tablet Take 1  tablet by mouth in the morning.    [provider]  Magnesium Cl-Calcium Carbonate (SLOW-MAG PO) Take 1 tablet by mouth 2 (two) times daily.    [provider]  metoprolol succinate (TOPROL-XL) 25 MG 24 hr tablet Take 0.5 tablets (12.5 mg total) by mouth daily. Pt needs to make appt to see provider for further refills - 2nd attempt 06/11/22   Belva Crome, MD  nitroGLYCERIN (NITROSTAT) 0.4 MG SL tablet Place 0.4 mg under the tongue every 5 (five) minutes as needed for chest pain.    [provider]  predniSONE (DELTASONE) 10 MG tablet Take 10 mg by mouth 2 (two) times daily. 04/30/22   [provider]     Allergies:    Allergies  Allergen Reactions   Brilinta [Ticagrelor] Shortness Of Breath   Codeine Nausea And Vomiting   Darvon [Propoxyphene] Nausea Only   Lipitor [Atorvastatin] Other (See Comments)    MYALGIAS   Rosuvastatin Other (See Comments)    MYALGIAS    Simvastatin Other (See Comments)    MYALGIAS    Ezetimibe     aches Other reaction(s): aches   Nexlizet [Bempedoic Acid-Ezetimibe]     Muscle aches (tolerates Zetia just fine, allergy is with bempedoic acid  component)   Tramadol Hcl Nausea Only    nausea (08/2019)    Social History:   Social History   Socioeconomic History   Marital status: Married    Spouse name: Meda Coffee   Number of children: Not on file   Years of education: 12   Highest education level: High school graduate  Occupational History   Occupation: Retired  Tobacco Use   Smoking status: Never   Smokeless tobacco: Never  Vaping Use   Vaping Use: Never used  Substance and Sexual Activity   Alcohol use: Yes    Alcohol/week: 4.0 standard drinks of alcohol    Types: 4 Glasses of wine per week    Comment: glass of wine every other night   Drug use: Never   Sexual activity: Not Currently  Other Topics Concern   Not on file  Social History Narrative   Not on file   Social Determinants of Health   Financial Resource Strain: Low Risk  (08/29/2018)   Overall Financial Resource Strain (CARDIA)    Difficulty of Paying Living Expenses: Not very hard  Food Insecurity: Not on file  Transportation Needs: No Transportation Needs (08/29/2018)   PRAPARE - Hydrologist (Medical): No    Lack of Transportation (Non-Medical): No  Physical Activity: Inactive (08/29/2018)   Exercise Vital Sign    Days of Exercise per Week: 0 days    Minutes of Exercise per Session: 0 min  Stress: No Stress Concern Present (08/29/2018)   [place]    Feeling of Stress : Not at all  Social Connections: Not on file  Intimate Partner Violence: Not on file    Family History:   The patient's family history includes CVA in her mother; Cancer in her brother; Emphysema (age of onset: 29) in her father.    ROS:  Please see the history of present illness.  All other ROS reviewed and negative.     Physical Exam/Data:   Vitals:   06/16/22 2018 06/16/22 2019 06/16/22 2020 06/16/22 2021  BP: (!) 141/72     Pulse: 80 78 77 76  Resp: (!) 29     Temp:  TempSrc:      SpO2: 99%     Weight:      Height:       No intake or output data in the 24 hours ending 06/16/22 2053    06/16/2022    4:24 PM 05/25/2022    3:21 PM 12/01/2021    9:52 AM  Last 3 Weights  Weight (lbs) 105 lb 110 lb 108 lb  Weight (kg) 47.628 kg 49.896 kg 48.988 kg     Body mass index is 21.21 kg/m.   Physical Exam per MD: General: 88 y.o. Caucasian female resting comfortably in no acute distress. HEENT: Normocephalic and atraumatic.  Neck: Supple. No JVD. Heart: Tachycardic with irregularly irregular rhythm. No murmurs, gallops, or rubs.  Lungs: No increased work of breathing. Clear to ausculation bilaterally. No wheezes, rhonchi, or rales.  Abdomen: Soft, non-distended, and non-tender to palpation. MSK: Normal strength and tone for age. Extremities: No lower extremity edema.    Skin: Warm and dry. Neuro: Alert and oriented x3. No focal deficits. Psych: Normal affect. Responds appropriately.  EKG:  The ECG that was done was personally reviewed and demonstrates: Initial EKG showed possible SVT (vs sinus tacycardia given P wave noted in some leads) with rates in the 140s; however, repeat EKG shows clear atrial fibrillation with rates in the 120s.  Relevant CV Studies:  Right/ Left Cardiac Catheterization 07/17/2018: Impressions: Coronary artery disease with high-grade obstruction in the mid LAD and proximal to mid first diagonal. Normal left main. Normal circumflex coronary artery. Dominant RCA with luminal irregularities up to 40% distally. Right heart pressures Ventricular systolic function and LVEDP (14 mmHg). Successful PCI and stent implantation in both the mid LAD and proximal to mid first diagonal.   The LAD 90% stenosis was reduced to 0% with TIMI grade III flow using a 2.5 x 28 Synergy postdilated to 2.75 mm in diameter.  TIMI grade III flow pre-and post stenting. The first diagonal 90% stenosis was reduced to 0% using by 2.25 mm Synergy deployed at  14 atm which is not effective stent size of 2.32 mm.  TIMI grade III flow noted pre-and post PCI.   Recommendations: Continue aggressive risk factor modification: PCSK9 therapy (Repatha), moderate aerobic physical activity, blood pressure control less than 140/90 mmHg, and surveillance of glycemic control. Aspirin and Brilinta x1 month.  Switch to aspirin and Plavix thereafter.  Total duration of dual antiplatelet therapy 6 months at which time Plavix can be dropped. Discharge in a.m.   Recommend uninterrupted dual antiplatelet therapy with Aspirin 31m daily and Ticagrelor 918mtwice daily for a minimum of 6 months (stable ischemic heart disease - Class I recommendation).  Diagnostic Dominance: Right  _______________  Echocardiogram 09/29/2021: Impressions:  1. Left ventricular ejection fraction, by estimation, is 60 to 65%. The  left ventricle has normal function. The left ventricle has no regional  wall motion abnormalities. There is mild left ventricular hypertrophy.  Left ventricular diastolic parameters  were normal.   2. Right ventricular systolic function is normal. The right ventricular  size is normal. There is mildly elevated pulmonary artery systolic  pressure.   3. The mitral valve is normal in structure. Mild mitral valve  regurgitation.   4. The aortic valve is calcified. Aortic valve regurgitation is mild.  Mild aortic valve stenosis.   5. The inferior vena cava is normal in size with greater than 50%  respiratory variability, suggesting right atrial pressure of 3 mmHg.   Conclusion(s)/Recommendation(s): Compared to prior echocardiogram  04/27/2017 no significant changes.    Laboratory Data:  High Sensitivity Troponin:   Recent Labs  Lab 06/16/22 1643 06/16/22 1856  TROPONINIHS 37* 96*      Chemistry Recent Labs  Lab 06/16/22 1643 06/16/22 1654 06/16/22 1856  NA 142 140  --   K 4.7 4.5  --   CL 109 108  --   CO2 21*  --   --   GLUCOSE 149* 146*  --    BUN 33* 32*  --   CREATININE 1.57* 1.60*  --   CALCIUM 9.4  --   --   MG  --   --  1.9  GFRNONAA 31*  --   --   ANIONGAP 12  --   --     No results for input(s): "PROT", "ALBUMIN", "AST", "ALT", "ALKPHOS", "BILITOT" in the last 168 hours. Lipids No results for input(s): "CHOL", "TRIG", "HDL", "LABVLDL", "LDLCALC", "CHOLHDL" in the last 168 hours. Hematology Recent Labs  Lab 06/16/22 1643 06/16/22 1654  WBC 10.7*  --   RBC 3.39*  --   HGB 11.5* 11.6*  HCT 35.3* 34.0*  MCV 104.1*  --   MCH 33.9  --   MCHC 32.6  --   RDW 13.5  --   PLT 202  --    Thyroid No results for input(s): "TSH", "FREET4" in the last 168 hours. BNPNo results for input(s): "BNP", "PROBNP" in the last 168 hours.  DDimer No results for input(s): "DDIMER" in the last 168 hours.   Radiology/Studies:  DG Chest Portable 1 View  Result Date: 06/16/2022 CLINICAL DATA:  Chest pain EXAM: PORTABLE CHEST 1 VIEW COMPARISON:  Chest x-ray dated October 05, 2021 FINDINGS: Patient is rotated to the right. Unchanged cardiac and mediastinal contours. Mild bibasilar linear opacities, likely due to atelectasis. Lungs otherwise clear. No large pleural effusion or evidence of pneumothorax. Moderate levocurvature of the thoracic spine, unchanged when compared to prior. IMPRESSION: Mild bibasilar atelectasis, lungs otherwise clear. Electronically Signed   By: Yetta Glassman M.D.   On: 06/16/2022 16:57     Assessment and Plan:   New Onset Atrial Fibrillation with RVR Rates initially in the 150s to 200s in the fields. Improved to the 110s to 120s after IV fluids and IV Lopressor. - Potassium 4.7 and Magnesium 1.9. - Will check TSH. - Will also check AM cortisol given patient was recently on an extended course of prednisone (tapered down 4-5 days ago) - Will order Echo for tomorrow. - Will start IV Diltiazem with bolus. - Continue Toprol-XL 12.66m daily. - CHA2DS2-VASc = 5 (CAD, HTN, age x2, female). Will start Eliquis. She  currently meets criteria for reduced dosing given age and renal function so will start 2.5564mtwice daily. If renal function improves, we may be able to increase to 64m364mwice daily.  - Hoping that she will convert on IV Diltiazem but may need TEE/DCCV if she does not. I think the board in full for tomorrow but will make her NPO at midnight just in case.  Elevated Troponin CAD S/p DES to LAD and diagonal 2019. High-sensitivity troponin 37 >> 97. She initial had some chest soreness with tachyarrhythmia but imprved with rate control. Suspect troponin elevation is demand ischemia in setting of tachyarrhythmia.  - Will stop Aspirin now that we are adding Eliquis. - Will continue beta-blocker. - Intolerant to statins. Continue Zetia. Also on Repatha at home.  PAD S/p stenting of left common iliac artery and left external iliac  artery in 07/2021. She is actually scheduled for repeat angiogram tomorrow given drop in ABI. However, this will need to be postponed given new onset atrial fibrillation. Will send Dr. Carlis Abbott a message and let him know.  Hypertension BP soft at times but stable. - Will hold home Lisinopril-HCTZ given AKI and soft BP to allow for more rate control. - Continue IV Cardizem and home Toprol-XL as above.  Hyperlipidemia - Intolerant to statins. - Continue Zetia 76m daily. Also on Repatha at home.  AKI Creatinine 1.57. Baseline around 0.8 to 1.3. - Will start IV fluids at 75cc/hr. Continue to hold Lisinopril-HCTZ. - Will repeat BMET in the morning.  COPD Stable.    Risk Assessment/Risk Scores:    CHA2DS2-VASc Score = 5  This indicates a 7.2% annual risk of stroke. The patient's score is based upon: CHF History: 0 HTN History: 1 Diabetes History: 0 Stroke History: 0 Vascular Disease History: 1 Age Score: 2 Gender Score: 1    Severity of Illness: The appropriate patient status for this patient is OBSERVATION. Observation status is judged to be reasonable and  necessary in order to provide the required intensity of service to ensure the patient's safety. The patient's presenting symptoms, physical exam findings, and initial radiographic and laboratory data in the context of their medical condition is felt to place them at decreased risk for further clinical deterioration. Furthermore, it is anticipated that the patient will be medically stable for discharge from the hospital within 2 midnights of admission.    For questions or updates, please contact CRandlettPlease consult [email or web address] for contact info under     Signed, CDarreld Mclean PA-C  06/16/2022 8:53 PM

## 2022-06-16 NOTE — ED Provider Notes (Signed)
[facility] Provider Note   CSN: [id number] Arrival date & time: 06/16/22  1617     History  Chief Complaint  Patient presents with   Chest Pain    Sydney Gross is a 88 y.o. female.  HPI 88 year old female presents with acute chest pain.  Feels like an aching sensation in her chest like a toothache.  Started about 2-1/2 hours ago.  She went and had a milkshake and then sometime after noticed this pain.  Went to the fire station was found to be in A-fib with RVR and EMS was called.  She has never had A-fib before.  No history of palpitations.  She is not on a blood thinner.  She has multiple comorbidities which include hypertension, CAD, COPD.  She denies any significant shortness of breath or dizziness or palpitations.  The chest aching is better at this moment.  She has had some chronic foot swelling for over a month but otherwise no new or worsening leg swelling. No recent illness.  Home Medications Prior to Admission medications   Medication Sig Start Date End Date Taking? Authorizing Provider  aspirin EC 81 MG tablet Take 1 tablet (81 mg total) by mouth daily. Swallow whole. 05/12/21  Yes Loel Dubonnet, NP  acetaminophen (TYLENOL) 650 MG CR tablet Take 650-1,300 mg by mouth every 8 (eight) hours as needed for pain.    [provider]  Budeson-Glycopyrrol-Formoterol (BREZTRI AEROSPHERE) 160-9-4.8 MCG/ACT AERO Inhale 2 puffs into the lungs 2 (two) times daily as needed (respiratory issues.). 11/15/19   [provider]  denosumab (PROLIA) 60 MG/ML SOSY injection Inject 60 mg as directed every 6 (six) months.    [provider]  diphenhydramine-acetaminophen (TYLENOL PM) 25-500 MG TABS tablet Take 1 tablet by mouth at bedtime.    [provider]  docusate sodium (COLACE) 100 MG capsule Take 200 mg by mouth 2 (two) times daily.    [provider]  Evolocumab (REPATHA SURECLICK) [id number] MG/ML SOAJ  INJECT 1 PEN INTO THE SKIN EVERY 14 DAYS 06/26/21   Belva Crome, MD  ezetimibe (ZETIA) 10 MG tablet Take 1 tablet by mouth once daily Patient taking differently: Take 10 mg by mouth at bedtime. 03/24/22   Belva Crome, MD  lisinopril-hydrochlorothiazide (ZESTORETIC) 20-25 MG tablet Take 1 tablet by mouth in the morning.    [provider]  Magnesium Cl-Calcium Carbonate (SLOW-MAG PO) Take 1 tablet by mouth 2 (two) times daily.    [provider]  metoprolol succinate (TOPROL-XL) 25 MG 24 hr tablet Take 0.5 tablets (12.5 mg total) by mouth daily. Pt needs to make appt to see provider for further refills - 2nd attempt 06/11/22   Belva Crome, MD  nitroGLYCERIN (NITROSTAT) 0.4 MG SL tablet Place 0.4 mg under the tongue every 5 (five) minutes as needed for chest pain.    [provider]  predniSONE (DELTASONE) 10 MG tablet Take 10 mg by mouth 2 (two) times daily. 04/30/22   [provider]      Allergies    Brilinta [ticagrelor], Codeine, Darvon [propoxyphene], Lipitor [atorvastatin], Rosuvastatin, Simvastatin, Ezetimibe, Nexlizet [bempedoic acid-ezetimibe], and Tramadol hcl    Review of Systems   Review of Systems  Constitutional:  Negative for fever.  Respiratory:  Negative for shortness of breath.   Cardiovascular:  Positive for chest pain and leg swelling. Negative for palpitations.  Neurological:  Negative for dizziness.    Physical Exam Updated Vital Signs  BP (!) 141/72   Pulse 76   Temp 98.7 F (37.1 C) (Oral)   Resp (!) 29   Ht 4\' 11"  (1.499 m)   Wt 47.6 kg   SpO2 99%   BMI 21.21 kg/m  Physical Exam Vitals and nursing note reviewed.  Constitutional:      General: She is not in acute distress.    Appearance: She is well-developed. She is not ill-appearing or diaphoretic.  HENT:     Head: Normocephalic and atraumatic.  Cardiovascular:     Rate and Rhythm: Tachycardia present. Rhythm irregular.     Heart sounds: Normal heart sounds.   Pulmonary:     Effort: Pulmonary effort is normal.     Breath sounds: Normal breath sounds.  Abdominal:     Palpations: Abdomen is soft.     Tenderness: There is no abdominal tenderness.  Skin:    General: Skin is warm and dry.  Neurological:     Mental Status: She is alert.     ED Results / Procedures / Treatments   Labs (all labs ordered are listed, but only abnormal results are displayed) Labs Reviewed  CBC WITH DIFFERENTIAL/PLATELET - Abnormal; Notable for the following components:      Result Value   WBC 10.7 (*)    RBC 3.39 (*)    Hemoglobin 11.5 (*)    HCT 35.3 (*)    MCV 104.1 (*)    All other components within normal limits  BASIC METABOLIC PANEL - Abnormal; Notable for the following components:   CO2 21 (*)    Glucose, Bld 149 (*)    BUN 33 (*)    Creatinine, Ser 1.57 (*)    GFR, Estimated 31 (*)    All other components within normal limits  I-STAT CHEM 8, ED - Abnormal; Notable for the following components:   BUN 32 (*)    Creatinine, Ser 1.60 (*)    Glucose, Bld 146 (*)    TCO2 21 (*)    Hemoglobin 11.6 (*)    HCT 34.0 (*)    All other components within normal limits  TROPONIN I (HIGH SENSITIVITY) - Abnormal; Notable for the following components:   Troponin I (High Sensitivity) 37 (*)    All other components within normal limits  TROPONIN I (HIGH SENSITIVITY) - Abnormal; Notable for the following components:   Troponin I (High Sensitivity) 96 (*)    All other components within normal limits  MAGNESIUM  BASIC METABOLIC PANEL  CBC  BRAIN NATRIURETIC PEPTIDE  CORTISOL-AM, BLOOD    EKG EKG Interpretation  Date/Time:  Wednesday June 16 2022 16:22:13 EDT Ventricular Rate:  147 PR Interval:  64 QRS Duration: 92 QT Interval:  295 QTC Calculation: 462 R Axis:   10 Text Interpretation: Supraventricular tachycardia Repolarization abnormality, prob rate related Confirmed by 10-25-2005 [phone]) on 06/16/2022 4:45:04 PM  Radiology DG Chest Portable  1 View  Result Date: 06/16/2022 CLINICAL DATA:  Chest pain EXAM: PORTABLE CHEST 1 VIEW COMPARISON:  Chest x-ray dated October 05, 2021 FINDINGS: Patient is rotated to the right. Unchanged cardiac and mediastinal contours. Mild bibasilar linear opacities, likely due to atelectasis. Lungs otherwise clear. No large pleural effusion or evidence of pneumothorax. Moderate levocurvature of the thoracic spine, unchanged when compared to prior. IMPRESSION: Mild bibasilar atelectasis, lungs otherwise clear. Electronically Signed   By: October 07, 2021 M.D.   On: 06/16/2022 16:57    Procedures .Critical Care  Performed by: 08/17/2022, MD Authorized by: Pricilla Loveless,  Lorin Picket, MD   Critical care provider statement:    Critical care time (minutes):  30   Critical care time was exclusive of:  Separately billable procedures and treating other patients   Critical care was necessary to treat or prevent imminent or life-threatening deterioration of the following conditions:  Circulatory failure and cardiac failure   Critical care was time spent personally by me on the following activities:  Development of treatment plan with patient or surrogate, discussions with consultants, evaluation of patient's response to treatment, examination of patient, ordering and review of laboratory studies, ordering and review of radiographic studies, ordering and performing treatments and interventions, pulse oximetry, re-evaluation of patient's condition and review of old charts     Medications Ordered in ED Medications  magnesium chloride (SLOW-MAG) 64 MG SR tablet 64 mg (64 mg Oral Patient Refused/Not Given 06/16/22 2135)  nitroGLYCERIN (NITROSTAT) SL tablet 0.4 mg (has no administration in time range)  acetaminophen (TYLENOL) tablet 650 mg (650 mg Oral Given 06/16/22 2135)  ondansetron (ZOFRAN) injection 4 mg (has no administration in time range)  0.9 %  sodium chloride infusion ( Intravenous New Bag/Given 06/16/22 1940)   diltiazem (CARDIZEM) 1 mg/mL load via infusion 10 mg (10 mg Intravenous Bolus from Bag 06/16/22 1941)    And  diltiazem (CARDIZEM) 125 mg in dextrose 5% 125 mL (1 mg/mL) infusion (5 mg/hr Intravenous New Bag/Given 06/16/22 1941)  apixaban (ELIQUIS) tablet 2.5 mg (2.5 mg Oral Given 06/16/22 2135)  sodium chloride 0.9 % bolus 1,000 mL (0 mLs Intravenous Stopped 06/16/22 1930)  metoprolol tartrate (LOPRESSOR) injection 2.5 mg (2.5 mg Intravenous Given 06/16/22 1640)  metoprolol tartrate (LOPRESSOR) injection 2.5 mg (2.5 mg Intravenous Given 06/16/22 1752)  aspirin chewable tablet 324 mg (324 mg Oral Given 06/16/22 1756)    ED Course/ Medical Decision Making/ A&P Clinical Course as of 06/16/22 2220  Wed Jun 16, 2022  1645 I discussed options with patient which includes rate control versus rhythm control given sudden onset and no arrhythmia history.  She wants to talk to family first.  We will give a small dose of metoprolol and some IV fluids.  Initial ECG shows SVT but on the monitor she is going back and forth between SVT and atrial fibrillation [SG]    Clinical Course User Index [SG] Pricilla Loveless, MD                           Medical Decision Making Amount and/or Complexity of Data Reviewed Labs: ordered.    Details: Kidney function shows AKI compared to baseline.  Slight leukocytosis.  Mild anemia.  Troponin of 37 is likely rate related. Radiology: ordered and independent interpretation performed.    Details: No pneumonia or CHF on chest x-ray ECG/medicine tests: ordered and independent interpretation performed.    Details: First EKG shows SVT and second shows atrial fibrillation  Risk OTC drugs. Prescription drug management. Decision regarding hospitalization.   Patient with new onset atrial fibrillation with RVR.  She is not distressed but does have some soft blood pressures.  Could be rate related.  Multiple discussions with patient and then later with husband and daughter who have  arrived.  Offered cardioversion multiple times and she considered risks/benefits but declines.  Do not think this is unreasonable but due to this she will need better rate control and admission.  Originally I discussed with the hospitalist for admission and consulted cardiology and Dr. Katrinka Blazing after seeing patient will  admit to his service and apply rate control with Cardizem.  Heart rate has improved with boluses of metoprolol but still tachycardic.  She was given IV fluid bolus for this AKI of unclear etiology.  Vascular surgery was made aware of the fact that she likely will not have her scheduled procedure tomorrow per cardiology recommendations.        Final Clinical Impression(s) / ED Diagnoses Final diagnoses:  New onset atrial fibrillation ([place])  Acute kidney injury [facility])    Rx / DC Orders ED Discharge Orders          Ordered    Amb referral to AFIB Clinic  Status:  Canceled        06/16/22 1832              Sherwood Gambler, MD 06/16/22 2222

## 2022-06-17 ENCOUNTER — Observation Stay (HOSPITAL_BASED_OUTPATIENT_CLINIC_OR_DEPARTMENT_OTHER): Payer: Medicare Other

## 2022-06-17 ENCOUNTER — Other Ambulatory Visit: Payer: Self-pay

## 2022-06-17 ENCOUNTER — Observation Stay (HOSPITAL_COMMUNITY): Payer: Medicare Other

## 2022-06-17 ENCOUNTER — Encounter (HOSPITAL_COMMUNITY): Payer: Self-pay | Admitting: Interventional Cardiology

## 2022-06-17 ENCOUNTER — Other Ambulatory Visit (HOSPITAL_COMMUNITY): Payer: Self-pay

## 2022-06-17 ENCOUNTER — Encounter (HOSPITAL_COMMUNITY)
Admission: EM | Disposition: A | Discharge status: Home / Self Care | Payer: Self-pay | Source: Home / Self Care | Attending: Emergency Medicine

## 2022-06-17 ENCOUNTER — Telehealth (HOSPITAL_COMMUNITY): Payer: Self-pay | Admitting: Pharmacy Technician

## 2022-06-17 ENCOUNTER — Ambulatory Visit (HOSPITAL_COMMUNITY): Admission: RE | Admit: 2022-06-17 | Payer: Medicare Other | Source: Ambulatory Visit | Admitting: Vascular Surgery

## 2022-06-17 DIAGNOSIS — I4891 Unspecified atrial fibrillation: Secondary | ICD-10-CM

## 2022-06-17 DIAGNOSIS — I739 Peripheral vascular disease, unspecified: Secondary | ICD-10-CM | POA: Diagnosis not present

## 2022-06-17 DIAGNOSIS — R140 Abdominal distension (gaseous): Secondary | ICD-10-CM | POA: Diagnosis not present

## 2022-06-17 DIAGNOSIS — I700 Atherosclerosis of aorta: Secondary | ICD-10-CM | POA: Diagnosis not present

## 2022-06-17 DIAGNOSIS — K5792 Diverticulitis of intestine, part unspecified, without perforation or abscess without bleeding: Secondary | ICD-10-CM | POA: Diagnosis present

## 2022-06-17 DIAGNOSIS — R799 Abnormal finding of blood chemistry, unspecified: Secondary | ICD-10-CM | POA: Diagnosis not present

## 2022-06-17 DIAGNOSIS — K5730 Diverticulosis of large intestine without perforation or abscess without bleeding: Secondary | ICD-10-CM | POA: Diagnosis not present

## 2022-06-17 DIAGNOSIS — M4186 Other forms of scoliosis, lumbar region: Secondary | ICD-10-CM | POA: Diagnosis not present

## 2022-06-17 DIAGNOSIS — I25118 Atherosclerotic heart disease of native coronary artery with other forms of angina pectoris: Secondary | ICD-10-CM | POA: Diagnosis not present

## 2022-06-17 DIAGNOSIS — K7460 Unspecified cirrhosis of liver: Secondary | ICD-10-CM | POA: Diagnosis not present

## 2022-06-17 DIAGNOSIS — I878 Other specified disorders of veins: Secondary | ICD-10-CM | POA: Diagnosis not present

## 2022-06-17 LAB — ECHOCARDIOGRAM COMPLETE
AR max vel: 2.65 cm2
AV Area VTI: 2.61 cm2
AV Area mean vel: 2.76 cm2
AV Mean grad: 6 mmHg
AV Peak grad: 11.8 mmHg
Ao pk vel: 1.72 m/s
Area-P 1/2: 2.83 cm2
Calc EF: 56.9 %
Height: 59 in
MV M vel: 5.36 m/s
MV Peak grad: 114.9 mmHg
P 1/2 time: 311 msec
S' Lateral: 2.2 cm
Single Plane A2C EF: 57.5 %
Single Plane A4C EF: 56.6 %
Weight: 1680 oz

## 2022-06-17 LAB — BASIC METABOLIC PANEL
Anion gap: 6 (ref 5–15)
BUN: 22 mg/dL (ref 8–23)
CO2: 22 mmol/L (ref 22–32)
Calcium: 8.4 mg/dL — ABNORMAL LOW (ref 8.9–10.3)
Chloride: 112 mmol/L — ABNORMAL HIGH (ref 98–111)
Creatinine, Ser: 0.96 mg/dL (ref 0.44–1.00)
GFR, Estimated: 57 mL/min — ABNORMAL LOW (ref >60)
Glucose, Bld: 100 mg/dL — ABNORMAL HIGH (ref 70–99)
Potassium: 4.2 mmol/L (ref 3.5–5.1)
Sodium: 140 mmol/L (ref 135–145)

## 2022-06-17 LAB — CBC
HCT: 34.7 % — ABNORMAL LOW (ref 36.0–46.0)
Hemoglobin: 11 g/dL — ABNORMAL LOW (ref 12.0–15.0)
MCH: 33.8 pg (ref 26.0–34.0)
MCHC: 31.7 g/dL (ref 30.0–36.0)
MCV: 106.8 fL — ABNORMAL HIGH (ref 80.0–100.0)
Platelets: 185 10*3/uL (ref 150–400)
RBC: 3.25 MIL/uL — ABNORMAL LOW (ref 3.87–5.11)
RDW: 13.3 % (ref 11.5–15.5)
WBC: 11.5 10*3/uL — ABNORMAL HIGH (ref 4.0–10.5)
nRBC: 0 % (ref 0.0–0.2)

## 2022-06-17 LAB — BRAIN NATRIURETIC PEPTIDE: B Natriuretic Peptide: 319.2 pg/mL — ABNORMAL HIGH (ref 0.0–100.0)

## 2022-06-17 SURGERY — ABDOMINAL AORTOGRAM W/LOWER EXTREMITY
Anesthesia: LOCAL

## 2022-06-17 MED ORDER — METOPROLOL SUCCINATE ER 25 MG PO TB24: 12.5000 mg | ORAL_TABLET | Freq: Every day | ORAL | Status: DC

## 2022-06-17 MED ORDER — DOCUSATE SODIUM 100 MG PO CAPS
200.0000 mg | ORAL_CAPSULE | Freq: Two times a day (BID) | ORAL | Status: DC
  Administered 2022-06-17: 200 mg via ORAL
  Filled 2022-06-17 (×3): qty 2

## 2022-06-17 MED ORDER — APIXABAN 2.5 MG PO TABS
2.5000 mg | ORAL_TABLET | Freq: Two times a day (BID) | ORAL | Status: DC
  Administered 2022-06-17 – 2022-06-18 (×3): 2.5 mg via ORAL
  Filled 2022-06-17 (×5): qty 1

## 2022-06-17 MED ORDER — MELATONIN 5 MG PO TABS
5.0000 mg | ORAL_TABLET | Freq: Every evening | ORAL | Status: DC | PRN
  Administered 2022-06-17: 5 mg via ORAL
  Filled 2022-06-17: qty 1

## 2022-06-17 MED ORDER — APIXABAN 2.5 MG PO TABS
2.5000 mg | ORAL_TABLET | Freq: Two times a day (BID) | ORAL | Status: DC
  Filled 2022-06-17: qty 1

## 2022-06-17 MED ORDER — IPRATROPIUM-ALBUTEROL 0.5-2.5 (3) MG/3ML IN SOLN
3.0000 mL | Freq: Four times a day (QID) | RESPIRATORY_TRACT | Status: DC | PRN
  Administered 2022-06-18: 3 mL via RESPIRATORY_TRACT
  Filled 2022-06-17: qty 3

## 2022-06-17 MED ORDER — ASPIRIN 81 MG PO TBEC: 81.0000 mg | DELAYED_RELEASE_TABLET | Freq: Every day | ORAL | Status: DC

## 2022-06-17 MED ORDER — METRONIDAZOLE 500 MG/100ML IV SOLN
500.0000 mg | Freq: Two times a day (BID) | INTRAVENOUS | Status: DC
  Administered 2022-06-17 (×2): 500 mg via INTRAVENOUS
  Filled 2022-06-17 (×5): qty 100

## 2022-06-17 MED ORDER — IOHEXOL 300 MG/ML  SOLN
100.0000 mL | Freq: Once | INTRAMUSCULAR | Status: AC | PRN
  Administered 2022-06-17: 100 mL via INTRAVENOUS

## 2022-06-17 MED ORDER — EZETIMIBE 10 MG PO TABS
10.0000 mg | ORAL_TABLET | Freq: Every day | ORAL | Status: DC
  Administered 2022-06-17: 10 mg via ORAL
  Filled 2022-06-17: qty 1

## 2022-06-17 MED ORDER — SACCHAROMYCES BOULARDII 250 MG PO CAPS
250.0000 mg | ORAL_CAPSULE | Freq: Two times a day (BID) | ORAL | Status: DC
  Administered 2022-06-17 – 2022-06-18 (×2): 250 mg via ORAL
  Filled 2022-06-17 (×4): qty 1

## 2022-06-17 MED ORDER — METOPROLOL SUCCINATE ER 25 MG PO TB24
25.0000 mg | ORAL_TABLET | Freq: Every day | ORAL | Status: DC
  Administered 2022-06-17 – 2022-06-18 (×2): 25 mg via ORAL
  Filled 2022-06-17 (×2): qty 1

## 2022-06-17 MED ORDER — BUDESON-GLYCOPYRROL-FORMOTEROL 160-9-4.8 MCG/ACT IN AERO: 2.0000 | INHALATION_SPRAY | Freq: Two times a day (BID) | RESPIRATORY_TRACT | Status: DC | PRN

## 2022-06-17 MED ORDER — HYDROCODONE-ACETAMINOPHEN 5-325 MG PO TABS: 1.0000 | ORAL_TABLET | Freq: Four times a day (QID) | ORAL | Status: DC | PRN

## 2022-06-17 MED ORDER — SODIUM CHLORIDE 0.9 % IV SOLN
2.0000 g | INTRAVENOUS | Status: DC
  Administered 2022-06-17 – 2022-06-18 (×2): 2 g via INTRAVENOUS
  Filled 2022-06-17 (×2): qty 20

## 2022-06-17 NOTE — ED Notes (Signed)
Cardiology at bedside.

## 2022-06-17 NOTE — Discharge Instructions (Addendum)
Follow up with Dr. Loreta Ave (GI) within the next 1-2 weeks. Follow up with your primary care provider within the next 1-2 weeks.   Information on my medicine - ELIQUIS (apixaban)  This medication education was reviewed with me or my healthcare representative as part of my discharge preparation.  Why was Eliquis prescribed for you? Eliquis was prescribed for you to reduce the risk of a blood clot forming that can cause a stroke if you have a medical condition called atrial fibrillation (a type of irregular heartbeat).  What do You need to know about Eliquis ? Take your Eliquis TWICE DAILY - one tablet in the morning and one tablet in the evening with or without food. If you have difficulty swallowing the tablet whole please discuss with your pharmacist how to take the medication safely.  Take Eliquis exactly as prescribed by your doctor and DO NOT stop taking Eliquis without talking to the doctor who prescribed the medication.  Stopping may increase your risk of developing a stroke.  Refill your prescription before you run out.  After discharge, you should have regular check-up appointments with your healthcare provider that is prescribing your Eliquis.  In the future your dose may need to be changed if your kidney function or weight changes by a significant amount or as you get older.  What do you do if you miss a dose? If you miss a dose, take it as soon as you remember on the same day and resume taking twice daily.  Do not take more than one dose of ELIQUIS at the same time to make up a missed dose.  Important Safety Information A possible side effect of Eliquis is bleeding. You should call your healthcare provider right away if you experience any of the following: Bleeding from an injury or your nose that does not stop. Unusual colored urine (red or dark brown) or unusual colored stools (red or black). Unusual bruising for unknown reasons. A serious fall or if you hit your head (even  if there is no bleeding).  Some medicines may interact with Eliquis and might increase your risk of bleeding or clotting while on Eliquis. To help avoid this, consult your healthcare provider or pharmacist prior to using any new prescription or non-prescription medications, including herbals, vitamins, non-steroidal anti-inflammatory drugs (NSAIDs) and supplements.  This website has more information on Eliquis (apixaban): http://www.eliquis.com/eliquis/home

## 2022-06-17 NOTE — ED Notes (Addendum)
Pt returned from X-Ray and X-Ray Tech did not notify RN. Pt noticed to be wheezing. Pt stated she felt SHOB. This RN noticed that the cardiac monitor leads, pulse ox and blood pressure cuff were removed. Pt's Purewick was also removed and placed at foot of bed. Pt stated "The X-Ray lady took everything off and told me to walk to the table". This RN placed pt back on cardiac monitor and noticed HR was 49. Pt is still noticed to be SHOB. This RN placed pt back on cardiac monitor, new purewick placed and vital signs obtained.

## 2022-06-17 NOTE — Discharge Summary (Addendum)
The patient has been seen in conjunction with Robet Leu, PAC. All aspects of care have been considered and discussed. The patient has been personally interviewed, examined, and all clinical data has been reviewed.  Please note she was admitted for PAF which spontaneously reverted. Because of age and high CHADS VASC, she was started on Eliquis. Subsequently and probably unrelated, developed abdominal bloating and diagnosed with diverticulosis. Antibiotics started and improved. Guided by General surgery and Hosptalists. Also, c/o nausea and vertigo which improved with antivert. Home today with timely f/u with Dr. Chestine Spore (Vasc. Surgery) and Lupita Raider (PCP).  Discharge Summary    Patient ID: Sydney Gross MRN: [id number]; DOB: [birth year]  Admit date: 06/16/2022 Discharge date: 06/18/2022  PCP:  Lupita Raider, MD   [facility] Providers Cardiologist:  Lesleigh Noe, MD     Discharge Diagnoses    Principal Problem:   Atrial fibrillation with rapid ventricular response [facility]) Active Problems:   Hypertension   Aortic stenosis, supravalvular   COPD (chronic obstructive pulmonary disease) (HCC)   PAD (peripheral artery disease) (HCC)   Coronary artery disease of native artery of native heart with stable angina pectoris (HCC)   Chronic diastolic CHF (congestive heart failure) (HCC)   Hyperlipidemia   Atrial fibrillation with RVR (HCC)   Diverticulitis    Diagnostic Studies/Procedures    Echocardiogram 06/17/22 1. Left ventricular ejection fraction, by estimation, is 65 to 70%. The  left ventricle has normal function. The left ventricle has no regional  wall motion abnormalities. Moderate asymmetric focal basal septal left  ventricular hypertrophy. There was  turbulence in the LV outflow tract but no significant LVOT gradient was  measured. No mitral valve SAM noted. Left ventricular diastolic parameters  are consistent with Grade II diastolic dysfunction  (pseudonormalization).   2. Right ventricular systolic function is normal. The right ventricular  size is normal. There is moderately elevated pulmonary artery systolic  pressure. The estimated right ventricular systolic pressure is 47.6 mmHg.   3. Left atrial size was mildly dilated.   4. The mitral valve is normal in structure. Mild mitral valve  regurgitation. No evidence of mitral stenosis.   5. The aortic valve is tricuspid. There is mild calcification of the  aortic valve. Aortic valve regurgitation is mild. No aortic stenosis is  present.   6. The inferior vena cava is normal in size with greater than 50%  respiratory variability, suggesting right atrial pressure of 3 mmHg. _____________   History of Present Illness     Sydney Gross is a 88 y.o. female with a past medical history of CAD status post DES to LAD and diagonal in 2019, PAD, cachectic abdominal aorta, COPD with mild pulmonary hypertension, hypertension, hyperlipidemia, upper GI bleed, arthritis.  Patient was first seen on 7/12 for the evaluation of new onset atrial fibrillation with RVR.  Patient is followed by Dr. Katrinka Blazing.  Reportedly, she had been doing well from a cardiac standpoint.  She had developed some pain that was described as a Cabin crew" in her left side about 6 to 8 weeks ago.  She did see her PCP for this who did not feel like it was cardiac in nature.  Patient was referred to Ortho.  Ortho did not feel like it was arthritis but they did prescribe prednisone.  Initially, they had plan to only continue prednisone for about 10 days.  However the patient felt significantly better on prednisone and asked for it to be continued for longer.  While on prednisone, patient did have some lower extremity swelling (mostly in her feet) but otherwise tolerated it well.  She was finally weaned off of prednisone 4 to 5 days prior to admission.   She presented to the ER on 7/12 complaining of chest soreness, palpitations, fatigue.   On interview, patient reported that she was in her usual state of health until about lunchtime.  After eating lunch, patient went to get a cold slushy and after drinking it developed chest soreness with palpitations and fatigue.  Patient went to the fire station where she was found to be in rapid atrial fibrillation with heart rate ranging from the 150s to 200s.  911 was called and patient was transported to the ED.  Patient denied any shortness of breath or orthopnea.  While in the ED, patient was tachycardic and her BP was soft at times.  Initial EKG looked like SVT, however she was noted to be going back and forth between SVT and atrial fibrillation on telemetry.  Initial high sensitive troponin 37.  Chest x-ray showed mild bibasilar atelectasis but was otherwise clear.  Patient was given 1 L fluid bolus and 2 doses of IV Lopressor with improvement in rate.  Hospital Course     Consultants: None   New Onset Atrial Fibrillation with RVR Rates initially in the 150s to 200s in the fields. Improved to the 110s to 120s after IV fluids and IV Lopressor.  - Patient was started on IV diltiazem upon admission-- converted to NSR yesterday AM. Reports feeling much better now that she is in sinus  - Discontinued diltiazem upon conversion to NSR  - Potassium 4.2. Mag 1.6 but patient was given 2 mg IV supplementation prior to dc. Also takes slow-mag at home  - TSH WNL  - Echocardiogram with EF 65-70%, no regional wall motion abnormalities, moderate asymmetric LVH, grade 2 diastolic dysfunction, normal RV systolic function. - Increased metoprolol succinate to 25 mg daily. BP and HR tolerated well  - CHA2DS2-VASc = 5 (CAD, HTN, age x2, female). Now on eliquis 2.5 mg BID (dose based on age, weight)  - Patient has a follow up appointment on 7/31. CBC and BMP/mag at that appointment (new AC, check renal function and mag)  - 14 day non live monitor at discharge    Elevated Troponin CAD S/p DES to LAD and  diagonal 2019. High-sensitivity troponin 37 >> 97. She initially had some chest soreness with tachyarrhythmia but improved with rate control. Suspect troponin elevation is demand ischemia in setting of tachyarrhythmia.  - Stopped Aspirin now that she is on Eliquis. - Continue metoprolol as above  - Intolerant to statins. Continue Zetia. Also on Repatha at home.  Diverticulitis  - Patient developed abdominal distention and LLQ on 7/13. CT abdomen noted acute diverticulitis of the sigmoid colon without abscess or signs of perforation  - Appreciate hospitalist service's assistance -- recommended Augmentin 500-125 mg BID for 8 days. Also recommended probiotic to prevent C.Diff  - Patient was able to tolerate HH Diet without any nausea, vomiting, or worsening abdominal pain prior to discharge  - Recommended that patient follow up with Dr Collene Mares within the next 2 weeks   Dizziness and Nausea  - Suspect vertigo with associated nausea - Started meclizine 12.5 mg BID with improvement in symptoms. Reportedly had minimal dizziness and no vertigo when working with PT or walking in halls   - Orthostatic vital signs were negative    PAD S/p stenting of left common  iliac artery and left external iliac artery in 07/2021. She is actually scheduled for repeat angiogram 7/13 given drop in ABI. However, this was postponed given new onset atrial fibrillation and hospital admission. Dr. Carlis Abbott was notified  - Dr. Carlis Abbott will set up lower extremity angiography in 1-2 weeks after she is over her current disease process    Hypertension - Held home Lisinopril-HCTZ given AKI -- OK to resume at discharge as renal function improved (0.5 pill daily)  - Continue Toprol-XL as above.   Hyperlipidemia - Intolerant to statins. - Continue Zetia 10mg  daily. Also on Repatha at home.   AKI Creatinine 1.57 on admission. Baseline around 0.8 to 1.3. - After IV fluids-- creatinine improved to 0.93 - Resume home Lisinopril-HCTZ at  discharge (0.5 pill daily)    COPD Stable.  Did the patient have an acute coronary syndrome (MI, NSTEMI, STEMI, etc) this admission?:  No                               Did the patient have a percutaneous coronary intervention (stent / angioplasty)?:  No.       Patient was seen and examined by Dr. Tamala Julian and deemed stable for discharge    _____________  Discharge Vitals Blood pressure (!) 146/70, pulse 99, temperature 99 F (37.2 C), temperature source Oral, resp. rate 17, height 4\' 11"  (1.499 m), weight 47.6 kg, SpO2 96 %.  Filed Weights   06/16/22 1624  Weight: 47.6 kg    Labs & Radiologic Studies    CBC Recent Labs    06/16/22 1643 06/16/22 1654 06/17/22 0522 06/18/22 0445  WBC 10.7*  --  11.5* 9.3  NEUTROABS 7.2  --   --  6.4  HGB 11.5*   < > 11.0* 10.6*  HCT 35.3*   < > 34.7* 32.2*  MCV 104.1*  --  106.8* 102.9*  PLT 202  --  185 188   < > = values in this interval not displayed.   Basic Metabolic Panel Recent Labs    06/16/22 1856 06/17/22 0522 06/18/22 0445  NA  --  140 139  K  --  4.2 4.2  CL  --  112* 110  CO2  --  22 22  GLUCOSE  --  100* 81  BUN  --  22 9  CREATININE  --  0.96 0.93  CALCIUM  --  8.4* 8.6*  MG 1.9  --  1.6*   Liver Function Tests No results for input(s): "AST", "ALT", "ALKPHOS", "BILITOT", "PROT", "ALBUMIN" in the last 72 hours. No results for input(s): "LIPASE", "AMYLASE" in the last 72 hours. High Sensitivity Troponin:   Recent Labs  Lab 06/16/22 1643 06/16/22 1856  TROPONINIHS 37* 96*    BNP Invalid input(s): "POCBNP" D-Dimer No results for input(s): "DDIMER" in the last 72 hours. Hemoglobin A1C No results for input(s): "HGBA1C" in the last 72 hours. Fasting Lipid Panel No results for input(s): "CHOL", "HDL", "LDLCALC", "TRIG", "CHOLHDL", "LDLDIRECT" in the last 72 hours. Thyroid Function Tests Recent Labs    06/18/22 0445  TSH 1.487   _____________  ECHOCARDIOGRAM COMPLETE  Result Date: 06/17/2022     ECHOCARDIOGRAM REPORT   Patient Name:   Sydney Gross Date of Exam: 06/17/2022 Medical Rec #:  [id number]         Height:       59.0 in Accession #:    [id number]  Weight:       105.0 lb Date of Birth:  07-11-1930         BSA:          1.403 m Patient Age:    14 years          BP:           154/66 mmHg Patient Gender: F                 HR:           80 bpm. Exam Location:  Inpatient Procedure: 2D Echo, Cardiac Doppler and Color Doppler Indications:    Afib  History:        Patient has prior history of Echocardiogram examinations, most                 recent 09/29/2021. CAD, COPD, Arrythmias:Atrial Fibrillation;                 Risk Factors:Hypertension and HLD.  Sonographer:    Joette Catching RCS Referring Phys: [id number] [place]  1. Left ventricular ejection fraction, by estimation, is 65 to 70%. The left ventricle has normal function. The left ventricle has no regional wall motion abnormalities. Moderate asymmetric focal basal septal left ventricular hypertrophy. There was turbulence in the LV outflow tract but no significant LVOT gradient was measured. No mitral valve SAM noted. Left ventricular diastolic parameters are consistent with Grade II diastolic dysfunction (pseudonormalization).  2. Right ventricular systolic function is normal. The right ventricular size is normal. There is moderately elevated pulmonary artery systolic pressure. The estimated right ventricular systolic pressure is 0000000 mmHg.  3. Left atrial size was mildly dilated.  4. The mitral valve is normal in structure. Mild mitral valve regurgitation. No evidence of mitral stenosis.  5. The aortic valve is tricuspid. There is mild calcification of the aortic valve. Aortic valve regurgitation is mild. No aortic stenosis is present.  6. The inferior vena cava is normal in size with greater than 50% respiratory variability, suggesting right atrial pressure of 3 mmHg. FINDINGS  Left Ventricle: Left ventricular ejection  fraction, by estimation, is 65 to 70%. The left ventricle has normal function. The left ventricle has no regional wall motion abnormalities. The left ventricular internal cavity size was normal in size. Moderate  asymmetric focal basal septal left ventricular hypertrophy. Left ventricular diastolic parameters are consistent with Grade II diastolic dysfunction (pseudonormalization). Right Ventricle: The right ventricular size is normal. No increase in right ventricular wall thickness. Right ventricular systolic function is normal. There is moderately elevated pulmonary artery systolic pressure. The tricuspid regurgitant velocity is 3.34 m/s, and with an assumed right atrial pressure of 3 mmHg, the estimated right ventricular systolic pressure is 0000000 mmHg. Left Atrium: Left atrial size was mildly dilated. Right Atrium: Right atrial size was normal in size. Pericardium: Trivial pericardial effusion is present. Mitral Valve: The mitral valve is normal in structure. There is mild calcification of the mitral valve leaflet(s). Mild mitral annular calcification. Mild mitral valve regurgitation. No evidence of mitral valve stenosis. Tricuspid Valve: The tricuspid valve is normal in structure. Tricuspid valve regurgitation is trivial. Aortic Valve: The aortic valve is tricuspid. There is mild calcification of the aortic valve. Aortic valve regurgitation is mild. Aortic regurgitation PHT measures 311 msec. No aortic stenosis is present. Aortic valve mean gradient measures 6.0 mmHg. Aortic valve peak gradient measures 11.8 mmHg. Aortic valve area, by VTI measures 2.61 cm. Pulmonic Valve: The pulmonic  valve was normal in structure. Pulmonic valve regurgitation is trivial. Aorta: The aortic root is normal in size and structure. Venous: The inferior vena cava is normal in size with greater than 50% respiratory variability, suggesting right atrial pressure of 3 mmHg. IAS/Shunts: No atrial level shunt detected by color flow  Doppler.  LEFT VENTRICLE PLAX 2D LVIDd:         3.20 cm     Diastology LVIDs:         2.20 cm     LV e' medial:    6.12 cm/s LV PW:         1.10 cm     LV E/e' medial:  17.5 LV IVS:        1.40 cm     LV e' lateral:   5.63 cm/s LVOT diam:     1.90 cm     LV E/e' lateral: 19.0 LV SV:         95 LV SV Index:   68 LVOT Area:     2.84 cm  LV Volumes (MOD) LV vol d, MOD A2C: 36.5 ml LV vol d, MOD A4C: 31.6 ml LV vol s, MOD A2C: 15.5 ml LV vol s, MOD A4C: 13.7 ml LV SV MOD A2C:     21.0 ml LV SV MOD A4C:     31.6 ml LV SV MOD BP:      19.4 ml RIGHT VENTRICLE             IVC RV Basal diam:  2.60 cm     IVC diam: 1.30 cm RV Mid diam:    1.60 cm RV S prime:     15.00 cm/s TAPSE (M-mode): 1.8 cm LEFT ATRIUM             Index        RIGHT ATRIUM          Index LA diam:        3.10 cm 2.21 cm/m   RA Area:     9.59 cm LA Vol (A2C):   34.6 ml 24.67 ml/m  RA Volume:   16.00 ml 11.41 ml/m LA Vol (A4C):   43.9 ml 31.30 ml/m LA Biplane Vol: 39.6 ml 28.23 ml/m  AORTIC VALVE                     PULMONIC VALVE AV Area (Vmax):    2.65 cm      PV Vmax:          1.40 m/s AV Area (Vmean):   2.76 cm      PV Peak grad:     7.8 mmHg AV Area (VTI):     2.61 cm      PR End Diast Vel: 8.41 msec AV Vmax:           172.00 cm/s AV Vmean:          118.000 cm/s AV VTI:            0.364 m AV Peak Grad:      11.8 mmHg AV Mean Grad:      6.0 mmHg LVOT Vmax:         161.00 cm/s LVOT Vmean:        115.000 cm/s LVOT VTI:          0.335 m LVOT/AV VTI ratio: 0.92 AI PHT:            311 msec  AORTA Ao Root diam: 2.60  cm Ao Asc diam:  2.60 cm MITRAL VALVE                TRICUSPID VALVE MV Area (PHT): 2.83 cm     TR Peak grad:   44.6 mmHg MV Decel Time: 268 msec     TR Vmax:        334.00 cm/s MR Peak grad: 114.9 mmHg MR Vmax:      536.00 cm/s   SHUNTS MV E velocity: 107.00 cm/s  Systemic VTI:  0.34 m MV A velocity: 91.30 cm/s   Systemic Diam: 1.90 cm MV E/A ratio:  1.17 Dalton McleanMD Electronically signed by Franki Monte Signature Date/Time:  06/17/2022/4:12:37 PM    Final    CT ABDOMEN PELVIS W CONTRAST  Result Date: 06/17/2022 CLINICAL DATA:  88 year old female history of acute onset of nonlocalized abdominal pain. EXAM: CT ABDOMEN AND PELVIS WITH CONTRAST TECHNIQUE: Multidetector CT imaging of the abdomen and pelvis was performed using the standard protocol following bolus administration of intravenous contrast. RADIATION DOSE REDUCTION: This exam was performed according to the departmental dose-optimization program which includes automated exposure control, adjustment of the mA and/or kV according to patient size and/or use of iterative reconstruction technique. CONTRAST:  165mL OMNIPAQUE IOHEXOL 300 MG/ML  SOLN COMPARISON:  CT the abdomen and pelvis 03/19/2022. FINDINGS: Lower chest: Mild cardiomegaly. Atherosclerotic calcifications in the thoracic aorta. Hepatobiliary: Liver has a shrunken appearance and nodular contour, indicative of underlying cirrhosis. Subcentimeter low-attenuation lesion in segment 2 of the liver (axial image 15 of series 3), too small to characterize, but statistically likely a tiny cyst. No other suspicious cystic or solid hepatic lesions. No intra or extrahepatic biliary ductal dilatation. Status post cholecystectomy. Pancreas: No pancreatic mass. No pancreatic ductal dilatation. No pancreatic or peripancreatic fluid collections or inflammatory changes. Spleen: Unremarkable. Adrenals/Urinary Tract: 4.6 cm exophytic low-attenuation nonenhancing lesion in the lower pole of the right kidney is compatible with a simple cyst. 9 mm fatty attenuation lesion in the medial aspect of the interpolar region of the left kidney is compatible with a small angiomyolipoma. Moderate atrophy and extensive cortical thinning in the left kidney. No hydroureteronephrosis. Urinary bladder is moderately distended, but is otherwise normal in appearance. Stomach/Bowel: The appearance of the stomach is normal. There is no pathologic dilatation of  small bowel or colon. Numerous colonic diverticulae are noted. In the region of the sigmoid colon there are surrounding inflammatory changes indicative of an acute diverticulitis. No discrete diverticular abscess or signs of frank perforation or confidently identified at this time. Normal appendix. Vascular/Lymphatic: Aortic atherosclerosis including fusiform ectasia of the infrarenal abdominal aorta which measures up to 2.9 x 2.3 cm. 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 Vascular stents in the left common iliac artery and left external iliac artery, which appear patent. No lymphadenopathy noted in the abdomen or pelvis. Reproductive: Status post hysterectomy. Left ovary is not confidently identified may be surgically absent or atrophic. Right ovary is unremarkable in appearance. Other: No significant volume of ascites.  No pneumoperitoneum. Musculoskeletal: There are no aggressive appearing lytic or blastic lesions noted in the visualized portions of the skeleton. IMPRESSION: 1. Acute diverticulitis of the sigmoid colon. No definite diverticular abscess or signs of frank perforation are noted at this time. 2. Cirrhosis. 3. 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 4. 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 5. Moderate atrophy and cortical thinning in the left kidney. 6. Additional incidental findings, as above. Electronically Signed   By: Vinnie Langton M.D.   On: 06/17/2022 09:32   DG Abd 2 Views  Result Date: 06/17/2022 CLINICAL DATA:  88 year old female with abdominal distension. EXAM: ABDOMEN - 2 VIEW COMPARISON:  CT Abdomen and Pelvis 03/19/2022 and earlier. FINDINGS: Upright and supine views at 0606 hours. No pneumoperitoneum. Grossly negative lung bases. Non obstructed bowel gas pattern.  Chronic left iliac artery stents. Aortoiliac calcified atherosclerosis. Stable cholecystectomy clips. Incidental pelvic phleboliths. Abdominal and pelvic visceral contours appear stable to the prior CT. Moderate dextroconvex lumbar scoliosis and degeneration. No acute osseous abnormality identified. IMPRESSION: 1.  Normal bowel gas pattern, no free air. 2. Aortic atherosclerosis with chronic left iliac stenting. Electronically Signed   By: Genevie Ann M.D.   On: 06/17/2022 07:22   DG Chest Portable 1 View  Result Date: 06/16/2022 CLINICAL DATA:  Chest pain EXAM: PORTABLE CHEST 1 VIEW COMPARISON:  Chest x-ray dated October 05, 2021 FINDINGS: Patient is rotated to the right. Unchanged cardiac and mediastinal contours. Mild bibasilar linear opacities, likely due to atelectasis. Lungs otherwise clear. No large pleural effusion or evidence of pneumothorax. Moderate levocurvature of the thoracic spine, unchanged when compared to prior. IMPRESSION: Mild bibasilar atelectasis, lungs otherwise clear. Electronically Signed   By: Yetta Glassman M.D.   On: 06/16/2022 16:57   VAS US AORTA/IVC/ILIACS  Result Date: 05/22/2022 ABDOMINAL AORTA STUDY Patient Name:  Sydney Gross  Date of Exam:   05/21/2022 Medical Rec #: [id number]          Accession #:    [id number] Date of Birth: [birth year]          Patient Gender: F Patient Age:   44 years Exam Location:  [facility] Procedure:      VAS US AORTA/IVC/ILIACS Referring Phys: Monica Martinez --------------------------------------------------------------------------------  Indications: Left common iliac and external iliac stent evaluation Risk Factors: Hypertension, hyperlipidemia. Vascular Interventions: Left CIA and EIA stents placed 07/10/2021.  Comparison Study: Increased velocity of stents since prior exam of 12/01/2021. Performing Technologist: Alvia Grove RVT  Examination Guidelines: A complete evaluation includes B-mode imaging, spectral Doppler, color  Doppler, and power Doppler as needed of all accessible portions of each vessel. Bilateral testing is considered an integral part of a complete examination. Limited examinations for reoccurring indications may be performed as noted.  Left CIA Stent(s): +---------------+--------+---------------+----------+--------+ Left CIA stent PSV cm/sStenosis       Waveform  Comments +---------------+--------+---------------+----------+--------+ Prox to Stent  67                     monophasic         +---------------+--------+---------------+----------+--------+ Proximal Stent 62                     monophasic         +---------------+--------+---------------+----------+--------+ Mid Stent      60                     monophasic         +---------------+--------+---------------+----------+--------+ Distal Stent   59                     monophasic         +---------------+--------+---------------+----------+--------+ Distal to Stent370     50-99% stenosismonophasicstenotic +---------------+--------+---------------+----------+--------+   Left Stent(s): +---------------+--------+---------------+----------+--------+  Left EIA stent PSV cm/sStenosis       Waveform  Comments +---------------+--------+---------------+----------+--------+ Prox to Stent  327     50-99% stenosismonophasicstenotic +---------------+--------+---------------+----------+--------+ Proximal Stent 310     50-99% stenosismonophasicstenotic +---------------+--------+---------------+----------+--------+ Mid Stent      166                    monophasic         +---------------+--------+---------------+----------+--------+ Distal Stent   89                     monophasic         +---------------+--------+---------------+----------+--------+ Distal to Stent89                     monophasic         +---------------+--------+---------------+----------+--------+ Right CFA 109 cm/s biphasic waveform, Left  CFA 65 cm/s monophasic waveform   Summary: 50 - 99% stenosis distal to CIA stent. 50 - 99% stenosis at proximal to and in the proximal EIA stent.  *See table(s) above for measurements and observations.  Electronically signed by Heath Lark on 05/22/2022 at 12:28:30 PM.    Final    VAS [name] ABI WITH/WO TBI  Result Date: 05/22/2022  LOWER EXTREMITY DOPPLER STUDY Patient Name:  Sydney Gross  Date of Exam:   05/21/2022 Medical Rec #: [id number]          Accession #:    [id number] Date of Birth: [birth year]          Patient Gender: F Patient Age:   89 years Exam Location:  [facility] Procedure:      VAS [name] ABI WITH/WO TBI Referring Phys: Sherald Hess --------------------------------------------------------------------------------  Indications: Peripheral artery disease. High Risk Factors: Hypertension, coronary artery disease. CHF Other Factors: Known left SFA occlusion.  Vascular Interventions: Left common and external iliac artery stents. Performing Technologist: Elita Quick RVT  Examination Guidelines: A complete evaluation includes at minimum, Doppler waveform signals and systolic blood pressure reading at the level of bilateral brachial, anterior tibial, and posterior tibial arteries, when vessel segments are accessible. Bilateral testing is considered an integral part of a complete examination. Photoelectric Plethysmograph (PPG) waveforms and toe systolic pressure readings are included as required and additional duplex testing as needed. Limited examinations for reoccurring indications may be performed as noted.  ABI Findings: +---------+------------------+-----+----------+--------+ Right    Rt Pressure (mmHg)IndexWaveform  Comment  +---------+------------------+-----+----------+--------+ Brachial 177                                       +---------+------------------+-----+----------+--------+ PTA      130               0.73 biphasic            +---------+------------------+-----+----------+--------+ DP       118               0.67 monophasic         +---------+------------------+-----+----------+--------+ Great Toe89                0.50 Abnormal           +---------+------------------+-----+----------+--------+ +---------+------------------+-----+----------+-------+ Left     Lt Pressure (mmHg)IndexWaveform  Comment +---------+------------------+-----+----------+-------+ Brachial 164                                      +---------+------------------+-----+----------+-------+  PTA      117               0.66 monophasicbrisk   +---------+------------------+-----+----------+-------+ DP       120               0.68 biphasic          +---------+------------------+-----+----------+-------+ Great Toe63                0.36 Abnormal          +---------+------------------+-----+----------+-------+ +-------+-----------+-----------+------------+------------+ ABI/TBIToday's ABIToday's TBIPrevious ABIPrevious TBI +-------+-----------+-----------+------------+------------+ Right  0.73       0.50       0.80        0.37         +-------+-----------+-----------+------------+------------+ Left   0.68       0.35       0.76        0.44         +-------+-----------+-----------+------------+------------+  Right ABIs appear decreased. Left ABIs appear essentially unchanged.  Summary: Right: Resting right ankle-brachial index indicates moderate right lower extremity arterial disease. The right toe-brachial index is abnormal. Left: Resting left ankle-brachial index indicates moderate left lower extremity arterial disease. The left toe-brachial index is abnormal. *See table(s) above for measurements and observations.  Electronically signed by Jamelle Haring on 05/22/2022 at 12:28:02 PM.    Final     Disposition   Pt is being discharged home today in good condition.  Follow-up Plans & Appointments     Follow-up  Information     Charlie Pitter, PA-C Follow up on 07/05/2022.   Specialties: Cardiology, Radiology Why: Appointment at 8 AM Contact information: [address] [place] [place] [address] [phone]                Discharge Instructions     Diet - low sodium heart healthy   Complete by: As directed    Discharge instructions   Complete by: As directed    See your MyChart messages for information about heart monitor. Monitor will be mailed to your home.   Monitor for signs of bleeding on eliquis (coughing up blood, blood in urine, bloody/dark stools). Call our office if these symptoms arise   Increase activity slowly   Complete by: As directed    No wound care   Complete by: As directed        Discharge Medications   Allergies as of 06/18/2022       Reactions   Brilinta [ticagrelor] Shortness Of Breath   Codeine Nausea And Vomiting   Darvon [propoxyphene] Nausea Only   Lipitor [atorvastatin] Other (See Comments)   MYALGIAS   Rosuvastatin Other (See Comments)   MYALGIAS   Simvastatin Other (See Comments)   MYALGIAS   Ezetimibe    aches Other reaction(s): aches   Nexlizet [bempedoic Acid-ezetimibe]    Muscle aches (tolerates Zetia just fine, allergy is with bempedoic acid component)   Tramadol Hcl Nausea Only   nausea (08/2019)        Medication List     STOP taking these medications    aspirin EC 81 MG tablet   cephALEXin 500 MG capsule Commonly known as: KEFLEX   predniSONE 10 MG tablet Commonly known as: DELTASONE       TAKE these medications    acetaminophen 650 MG CR tablet Commonly known as: TYLENOL Take 1,300 mg by mouth as needed for pain.   amoxicillin-clavulanate 500-125 MG tablet Commonly known as:  AUGMENTIN Take 1 tablet (500 mg total) by mouth 2 (two) times daily for 8 days. Start taking on: June 19, 2022   apixaban 2.5 MG Tabs tablet Commonly known as: ELIQUIS Take 1 tablet (2.5 mg total) by mouth 2 (two)  times daily.   Breztri Aerosphere 160-9-4.8 MCG/ACT Aero Generic drug: Budeson-Glycopyrrol-Formoterol Inhale 2 puffs into the lungs 2 (two) times daily as needed (Shortness of breath/ COPD).   denosumab 60 MG/ML Sosy injection Commonly known as: PROLIA Inject 60 mg as directed every 6 (six) months.   diphenhydramine-acetaminophen 25-500 MG Tabs tablet Commonly known as: TYLENOL PM Take 1 tablet by mouth at bedtime.   docusate sodium 100 MG capsule Commonly known as: COLACE Take 200 mg by mouth 2 (two) times daily.   ezetimibe 10 MG tablet Commonly known as: ZETIA Take 1 tablet by mouth once daily   Florastor 250 MG capsule Generic drug: saccharomyces boulardii Take 1 capsule (250 mg total) by mouth 2 (two) times daily for 10 days.   lisinopril-hydrochlorothiazide 20-25 MG tablet Commonly known as: ZESTORETIC Take 0.5 tablets by mouth in the morning. What changed: how much to take   meclizine 12.5 MG tablet Commonly known as: ANTIVERT Take 1 tablet (12.5 mg total) by mouth 3 (three) times daily.   metoprolol succinate 25 MG 24 hr tablet Commonly known as: TOPROL-XL Take 1 tablet (25 mg total) by mouth daily. Pt needs to make appt to see provider for further refills - 2nd attempt What changed: how much to take   nitroGLYCERIN 0.4 MG SL tablet Commonly known as: NITROSTAT Place 0.4 mg under the tongue every 5 (five) minutes as needed for chest pain.   Repatha SureClick XX123456 MG/ML Soaj Generic drug: Evolocumab INJECT 1 PEN INTO THE SKIN EVERY 14 DAYS What changed: See the new instructions.   SLOW-MAG PO Take 1 tablet by mouth 2 (two) times daily.           Outstanding Labs/Studies   CBC and BMP at follow up   Duration of Discharge Encounter   Greater than 30 minutes including physician time.  Signed, Margie Billet, PA-C 06/18/2022, 3:44 PM

## 2022-06-17 NOTE — Progress Notes (Addendum)
The patient has been seen in conjunction with Robet Leu, PAC. All aspects of care have been considered and discussed. The patient has been personally interviewed, examined, and all clinical data has been reviewed.  Spontaneous conversion to NSR. Plan to DC diltiazem and increase the dose of oral Metoprolol succinate to 25 mg daily. Continue Eliquis. 2 week monitor to determine AF burden. Reschedule LE angiogram for ASAP. Okay to pause Eliquis for 48-72 hours prior to angio. Discharge this AM No ischemic workup unless angina while in NSR. Acute kidney injury resolved.   Progress Note  Patient Name: Sydney Gross Date of Encounter: 06/17/2022  [facility] Cardiologist: Lyn Records III, MD   Subjective   Overnight, patient reported left lower quadrant pain, abdominal distention.  Overnight fellow evaluated patient and ordered abdominal x-ray.  On my reevaluation this a.m., patient reports that she is passing gas and feels better.  Still has some mild left lower quadrant tenderness to palpation and abdominal distention. Reports having a bowel movement yesterday. Does take medication at home for constipation, but does not recall the name   Per telemetry, patient converted to sinus rhythm at around 5 AM.  Since then she is maintaining normal sinus rhythm with heart rate in the 80s.  She denies any chest pain, shortness of breath, palpitations, dizziness, syncope/near syncope. Did have some SOB when walking to the table for x-ray, reports this is normal for her with her COPD.   Inpatient Medications    Scheduled Meds:  apixaban  2.5 mg Oral BID   magnesium chloride  1 tablet Oral BID   Continuous Infusions:  diltiazem (CARDIZEM) infusion Stopped (06/16/22 2357)   PRN Meds: acetaminophen, nitroGLYCERIN, ondansetron (ZOFRAN) IV   Vital Signs    Vitals:   06/17/22 0555 06/17/22 0625 06/17/22 0630 06/17/22 0635  BP:   (!) 155/73   Pulse: 87 (!) 49 89 85  Resp: (!) 27   (!) 24 (!) 23  Temp:      TempSrc:      SpO2: 98% 98% 99% 100%  Weight:      Height:       No intake or output data in the 24 hours ending 06/17/22 0727    06/16/2022    4:24 PM 05/25/2022    3:21 PM 12/01/2021    9:52 AM  Last 3 Weights  Weight (lbs) 105 lb 110 lb 108 lb  Weight (kg) 47.628 kg 49.896 kg 48.988 kg      Telemetry    Converted to NSR at 5 AM  - Personally Reviewed  ECG    NSR, HR 85 BPM - Personally Reviewed  Physical Exam   GEN: No acute distress.  Laying flat in the bed  Neck: No JVD Cardiac: RRR, grade 2/6 systolic murmur at left/right upper sternal boarder  Respiratory: Expiratory wheezes  GI: Distended. Tenderness to deep palpation of LLQ MS: No edema; No deformity. Neuro:  Nonfocal  Psych: Normal affect   Labs    High Sensitivity Troponin:   Recent Labs  Lab 06/16/22 1643 06/16/22 1856  TROPONINIHS 37* 96*     Chemistry Recent Labs  Lab 06/16/22 1643 06/16/22 1654 06/16/22 1856 06/17/22 0522  NA 142 140  --  140  K 4.7 4.5  --  4.2  CL 109 108  --  112*  CO2 21*  --   --  22  GLUCOSE 149* 146*  --  100*  BUN 33* 32*  --  22  CREATININE 1.57* 1.60*  --  0.96  CALCIUM 9.4  --   --  8.4*  MG  --   --  1.9  --   GFRNONAA 31*  --   --  57*  ANIONGAP 12  --   --  6    Lipids No results for input(s): "CHOL", "TRIG", "HDL", "LABVLDL", "LDLCALC", "CHOLHDL" in the last 168 hours.  Hematology Recent Labs  Lab 06/16/22 1643 06/16/22 1654 06/17/22 0522  WBC 10.7*  --  11.5*  RBC 3.39*  --  3.25*  HGB 11.5* 11.6* 11.0*  HCT 35.3* 34.0* 34.7*  MCV 104.1*  --  106.8*  MCH 33.9  --  33.8  MCHC 32.6  --  31.7  RDW 13.5  --  13.3  PLT 202  --  185   Thyroid No results for input(s): "TSH", "FREET4" in the last 168 hours.  BNPNo results for input(s): "BNP", "PROBNP" in the last 168 hours.  DDimer No results for input(s): "DDIMER" in the last 168 hours.   Radiology    DG Abd 2 Views  Result Date: 06/17/2022 CLINICAL DATA:   88 year old female with abdominal distension. EXAM: ABDOMEN - 2 VIEW COMPARISON:  CT Abdomen and Pelvis 03/19/2022 and earlier. FINDINGS: Upright and supine views at 0606 hours. No pneumoperitoneum. Grossly negative lung bases. Non obstructed bowel gas pattern. Chronic left iliac artery stents. Aortoiliac calcified atherosclerosis. Stable cholecystectomy clips. Incidental pelvic phleboliths. Abdominal and pelvic visceral contours appear stable to the prior CT. Moderate dextroconvex lumbar scoliosis and degeneration. No acute osseous abnormality identified. IMPRESSION: 1.  Normal bowel gas pattern, no free air. 2. Aortic atherosclerosis with chronic left iliac stenting. Electronically Signed   By: Genevie Ann M.D.   On: 06/17/2022 07:22   DG Chest Portable 1 View  Result Date: 06/16/2022 CLINICAL DATA:  Chest pain EXAM: PORTABLE CHEST 1 VIEW COMPARISON:  Chest x-ray dated October 05, 2021 FINDINGS: Patient is rotated to the right. Unchanged cardiac and mediastinal contours. Mild bibasilar linear opacities, likely due to atelectasis. Lungs otherwise clear. No large pleural effusion or evidence of pneumothorax. Moderate levocurvature of the thoracic spine, unchanged when compared to prior. IMPRESSION: Mild bibasilar atelectasis, lungs otherwise clear. Electronically Signed   By: Yetta Glassman M.D.   On: 06/16/2022 16:57    Cardiac Studies   Echocardiogram pending   Patient Profile     88 y.o. female with a history of CAD s/p DES to LAD and diagonal 2019, PAD, ectatic abdominal aorta, COPD with mild pulmonary hypertension, hypertension, hyperlipidemia, upper GI bleed, and arthritis who is being seen for the evaluation of new onset atrial fibrillation with RVR.   Assessment & Plan    New Onset Atrial Fibrillation with RVR Rates initially in the 150s to 200s in the fields. Improved to the 110s to 120s after IV fluids and IV Lopressor.  - Patient was started on IV diltiazem yesterday-- converted to NSR at  5 AM today. Reports feeling much better now that she is in sinus  - Potassium 4.7 and Magnesium 1.9. - AM cortisol and TSH pending,  - Echocardiogram pending  - Continue Toprol-XL 12.5mg  daily - Transition from IV dilt to oral today - CHA2DS2-VASc = 5 (CAD, HTN, age x2, female). Now on eliquis 2.5 mg BID- increase to 5 mg BID since renal function improved overnight with IV fluids    Elevated Troponin CAD S/p DES to LAD and diagonal 2019. High-sensitivity troponin 37 >> 97. She initial had  some chest soreness with tachyarrhythmia but imprved with rate control. Suspect troponin elevation is demand ischemia in setting of tachyarrhythmia.  - Stopped Aspirin now that she is on Eliquis. - Will continue beta-blocker. - Intolerant to statins. Continue Zetia. Also on Repatha at home.   PAD S/p stenting of left common iliac artery and left external iliac artery in 07/2021. She is actually scheduled for repeat angiogram today given drop in ABI. However, this will need to be postponed given new onset atrial fibrillation. Marjie Skiff PA-C sent a message yesterday to let Dr. Chestine Spore know    Hypertension - Holding home Lisinopril-HCTZ given AKI  - Continue IV Cardizem and home Toprol-XL as above.   Hyperlipidemia - Intolerant to statins. - Continue Zetia 10mg  daily. Also on Repatha at home.   AKI Creatinine 1.57 on admission. Baseline around 0.8 to 1.3. - After IV fluids-- creatinine improved to 0.96 - Continue to hold Lisinopril-HCTZ    COPD Stable.  For questions or updates, please contact [facility] Please consult [email or web address] for contact info under        Signed, , PA-C  06/17/2022, 7:27 AM

## 2022-06-17 NOTE — ED Notes (Signed)
Cleaned patient up changed her sheets got her a warm blanket patient is resting with family at bedside and call bell in reach

## 2022-06-17 NOTE — Consult Note (Signed)
Initial Consultation Note   Patient: Sydney Gross [id number] DOB: [birth year] PCP: Mayra Neer, MD DOA: 06/16/2022 DOS: the patient was seen and examined on 06/17/2022 Primary service: Belva Crome, MD  Referring physician: Willeen Niece, MD Reason for consult: Diverticulitis management  Assessment/Plan:  New onset atrial fibrillation Patient presented in A-fib with RVR but spontaneously converted to normal sinus rhythm on Cardizem drip.  CHA2DS2-VASc score = %Cardizem drip was discontinued and patient started on metoprolol XL 12.5 mg daily -Follow-up TSH -Follow-up echocardiogram -Continue metoprolol -Continue Eliquis -Per cardiology  Elevated troponin CAD At bedtime troponin 37->97. Prior history of drug-eluting stent to LAD in 2019. -Continue beta-blocker, Repatha, Zetia  Diverticulitis Acute.  Patient noted to have acute abdominal pain.  CT scan of the abdomen noted acute diverticulitis of the sigmoid colon without abscess or  signs of perforation.  As this is an uncomplicated case of diverticulitis suspect patient likely to be managed with oral medicines. -Clear liquid diet and advance diet as tolerated -Rocephin 2 g IV daily and metronidazole 500 mg IV twice daily -If improvement in a.m. switch to Augmentin 875-125 mg twice daily to complete a 7-day course. -Given probiotic to take during antibiotic course to decrease risk of C. Difficile -Patient advised to follow-up in outpatient setting with Dr. Collene Mares once resolution of diverticulitis to see if colonoscopy is warranted  Leukocytosis Acute.  WBC 10.7-> 11.5 today.  Suspect secondary to diverticulitis -Continue to monitor  AKI Resolving.  On admission creatinine elevated up to 1.6, but repeat check 0.96.  Thought secondary to hypoperfusion in setting of A-fib with RVR -Continue to monitor  PAD s/p prior stenting  Hyperlipidemia Patient reportedly intolerant to statins -Continue Zetia and  Repatha,  Essential hypertension Home medications previously included lisinopril-hydrochlorothiazide -Held initially due to AKI  COPD Stable, without signs of acute flare. -Inhalers as needed  TRH will continue to follow the patient.  HPI: Sydney Gross is a 88 y.o. female with past medical history of hypertension, hyperlipidemia, COPD, chronic respiratory failure on 2 L nasal cannula oxygen presents with complaints of chest pain found to be in A-fib with RVR.  She reported having centralized chest pain that moved to the left side of her chest yesterday evening.  She had been having some pain in her left side 1 to 2 months ago for which she had been evaluated by her gastroenterologist Dr. Collene Mares and subsequently sent to orthopedics.  No clear cause was noted for patient's symptoms.  She had been prescribed prednisone which she felt better, but had been weaned off 4 to 5 days ago.  Due to the chest discomfort she had been seen at the local fire department and was found to be in atrial fibrillation with heart rates in the 150s to 200s for which EMS was called.  She denies having any significant shortness of breath.  Patient was started on diltiazem and Eliquis.  Patient spontaneously converted into normal sinus rhythm.  Overnight patient developed progressively worsening lower abdominal pain with distention.  She was having to have a bowel movement last night and still is able to pass flatus.  Denies any blood in stools.  She is unsure of when her last colonoscopy was but states that its been a couple years now.  Review of Systems: As mentioned in the history of present illness. All other systems reviewed and are negative. Past Medical History:  Diagnosis Date   Allergic rhinitis    Arthritis    "maybe a little  in my knees and back" (07/17/2018)   Chronic lower back pain    Colitis 12/2013   "never had this before"   COPD (chronic obstructive pulmonary disease) (HCC)    "questionable/Dr.  Katrinka Blazing" (07/17/2018)   Coronary artery disease    Family history of anesthesia complication    "mother; think they gave her too much; c/o pain after OR; gave her more RX; they had to bring her back real quick"   GI bleed    H/O calcium pyrophosphate deposition disease (CPPD)    Heart murmur    Hiatal hernia    Hyperlipidemia    Hypertension    Mild mitral regurgitation    Numbness in feet    On home oxygen therapy    "I've used it 2 times in 2 yrs" (07/17/2018)   PAD (peripheral artery disease) (HCC)    Proteinuria    Pulmonary hypertension (HCC)    Shingles    Stenosis of tear duct    Past Surgical History:  Procedure Laterality Date   ABDOMINAL AORTOGRAM W/LOWER EXTREMITY N/A 07/10/2021   Procedure: ABDOMINAL AORTOGRAM W/LOWER EXTREMITY;  Surgeon: Sherren Kerns, MD;  Location: [facility] INVASIVE CV LAB;  Service: Cardiovascular;  Laterality: N/A;   ABDOMINAL HYSTERECTOMY  1960's   APPENDECTOMY     BACK SURGERY     BIOPSY  04/20/2021   Procedure: BIOPSY;  Surgeon: Jeani Hawking, MD;  Location: [facility];  Service: Endoscopy;;   BLEPHAROPLASTY Bilateral 05/2017   CARDIAC CATHETERIZATION     CATARACT EXTRACTION, BILATERAL Bilateral    CESAREAN SECTION  1957; 1960   CORONARY STENT INTERVENTION N/A 07/17/2018   Procedure: CORONARY STENT INTERVENTION;  Surgeon: Lyn Records, MD;  Location: [facility]VAS[facility] CV [facility];  Service: Cardiovascular;  Laterality: N/A;   DACROCYSTORHINOSTOMY W/ JONES TUBE Bilateral 05/2017   "put tear duct in my eyes"   ESOPHAGOGASTRODUODENOSCOPY (EGD) WITH PROPOFOL N/A 04/20/2021   Procedure: ESOPHAGOGASTRODUODENOSCOPY (EGD) WITH PROPOFOL;  Surgeon: Jeani Hawking, MD;  Location: [facility];  Service: Endoscopy;  Laterality: N/A;   EYE SURGERY     LAPAROSCOPIC CHOLECYSTECTOMY  ~ 2009   LUMBAR DISC SURGERY  ?2004   PERIPHERAL VASCULAR INTERVENTION Left 07/10/2021   Procedure: PERIPHERAL VASCULAR INTERVENTION;  Surgeon: Sherren Kerns, MD;  Location: [facility] INVASIVE CV  LAB;  Service: Cardiovascular;  Laterality: Left;  common, external iliac arteries   RIGHT/LEFT HEART CATH AND CORONARY ANGIOGRAPHY N/A 07/17/2018   Procedure: RIGHT/LEFT HEART CATH AND CORONARY ANGIOGRAPHY;  Surgeon: Lyn Records, MD;  Location: [facility] INVASIVE CV LAB;  Service: Cardiovascular;  Laterality: N/A;   TONSILLECTOMY AND ADENOIDECTOMY  ?1940's   Social History:  reports that she has never smoked. She has never used smokeless tobacco. She reports current alcohol use of about 4.0 standard drinks of alcohol per week. She reports that she does not use drugs.  Allergies  Allergen Reactions   Brilinta [Ticagrelor] Shortness Of Breath   Codeine Nausea And Vomiting   Darvon [Propoxyphene] Nausea Only   Lipitor [Atorvastatin] Other (See Comments)    MYALGIAS   Rosuvastatin Other (See Comments)    MYALGIAS    Simvastatin Other (See Comments)    MYALGIAS    Ezetimibe     aches Other reaction(s): aches   Nexlizet [Bempedoic Acid-Ezetimibe]     Muscle aches (tolerates Zetia just fine, allergy is with bempedoic acid component)   Tramadol Hcl Nausea Only    nausea (08/2019)    Family History  Problem Relation Age of  Onset   Emphysema Father 38   CVA Mother    Cancer Brother        ? type    Prior to Admission medications   Medication Sig Start Date End Date Taking? Authorizing Provider  aspirin EC 81 MG tablet Take 1 tablet (81 mg total) by mouth daily. Swallow whole. 05/12/21  Yes Alver Sorrow, NP  Budeson-Glycopyrrol-Formoterol (BREZTRI AEROSPHERE) 160-9-4.8 MCG/ACT AERO Inhale 2 puffs into the lungs 2 (two) times daily as needed (Shortness of breath/ COPD). 11/15/19  Yes [provider]  cephALEXin (KEFLEX) 500 MG capsule Take 500 mg by mouth 2 (two) times daily. 06/14/22  Yes [provider]  denosumab (PROLIA) 60 MG/ML SOSY injection Inject 60 mg as directed every 6 (six) months.   Yes [provider]  diphenhydramine-acetaminophen (TYLENOL PM)  25-500 MG TABS tablet Take 1 tablet by mouth at bedtime.   Yes [provider]  docusate sodium (COLACE) 100 MG capsule Take 200 mg by mouth 2 (two) times daily.   Yes [provider]  Evolocumab (REPATHA SURECLICK) 140 MG/ML SOAJ INJECT 1 PEN INTO THE SKIN EVERY 14 DAYS Patient taking differently: Inject 140 mg into the skin every 14 (fourteen) days. 06/26/21  Yes Lyn Records, MD  ezetimibe (ZETIA) 10 MG tablet Take 1 tablet by mouth once daily Patient taking differently: Take 10 mg by mouth daily. 03/24/22  Yes Lyn Records, MD  lisinopril-hydrochlorothiazide (ZESTORETIC) 20-25 MG tablet Take 1 tablet by mouth in the morning.   Yes [provider]  Magnesium Cl-Calcium Carbonate (SLOW-MAG PO) Take 1 tablet by mouth 2 (two) times daily.   Yes [provider]  metoprolol succinate (TOPROL-XL) 25 MG 24 hr tablet Take 0.5 tablets (12.5 mg total) by mouth daily. Pt needs to make appt to see provider for further refills - 2nd attempt Patient taking differently: Take 12.5 mg by mouth daily. 06/11/22  Yes Lyn Records, MD  nitroGLYCERIN (NITROSTAT) 0.4 MG SL tablet Place 0.4 mg under the tongue every 5 (five) minutes as needed for chest pain.   Yes [provider]  acetaminophen (TYLENOL) 650 MG CR tablet Take 650-1,300 mg by mouth every 8 (eight) hours as needed for pain.    [provider]  predniSONE (DELTASONE) 10 MG tablet Take 10 mg by mouth 2 (two) times daily. Patient not taking: Reported on 06/17/2022 04/30/22   [provider]    Physical Exam: Vitals:   06/17/22 0635 06/17/22 0800 06/17/22 0930 06/17/22 0948  BP:  (!) 147/65 (!) 159/69 (!) 159/69  Pulse: 85 86 90 84  Resp: (!) 23 (!) 30 (!) 24   Temp:      TempSrc:      SpO2: 100% 99% 98%   Weight:      Height:       Exam  Constitutional: Elderly female currently in no acute distress Eyes: PERRL, lids and conjunctivae normal ENMT: Mucous membranes are moist.  Normal  dentition Neck: normal, supple, no masses, no thyromegaly Respiratory: Mildly tachypneic with mild expiratory wheeze appreciated.  Patient on room air with O2 saturations maintained Cardiovascular: Regular rate and rhythm with positive systolic murmur appreciated.  No significant lower extremity edema Abdomen: Abdomen distended with mild tenderness to deep palpation lower abdomen.  Bowel sounds present all quadrants. Neurologic: CN 2-12 grossly intact.  Strength 5/5 in all 4.  Psychiatric: Normal judgment and insight. Alert and oriented x 3. Normal mood.   Data Reviewed:  Reviewed labs and imaging as noted above   Family Communication: None Primary team communication:   Thank you very much for involving [name] in the care of your patient.  Author: Norval Morton, MD 06/17/2022 10:25 AM  For on call review [email or web address].

## 2022-06-17 NOTE — TOC Benefit Eligibility Note (Signed)
Patient Product/process development scientist completed.    The patient is currently admitted and upon discharge could be taking Eliquis 5 mg.  The current 30 day co-pay is, $141.89 due to being in Coverage Gap (donut hole).   The patient is currently admitted and upon discharge could be taking Xarelto 20 mg.  The current 30 day co-pay is, $137.19 due to being in Coverage Gap (donut hole).   The patient is insured through [facility] Medicare Part D     Sydney Gross, CPhT Pharmacy Patient Advocate Specialist [facility] Patient Advocate Team Direct Number: [phone]  Fax: [phone]

## 2022-06-17 NOTE — ED Notes (Signed)
Pt c/o Chest Pain radiating to Left breast. Patient also c/o LLQ pain. Abdomen is noted to be distended and tender to touch. Admitting and Cardiology has been paged.

## 2022-06-17 NOTE — Telephone Encounter (Signed)
Pharmacy Patient Advocate Encounter  Insurance verification completed.    The patient is insured through [employer] Part D   The patient is currently admitted and ran test claims for the following: Eliquis, Xarelto.  Copays and coinsurance results were relayed to Inpatient clinical team.  

## 2022-06-17 NOTE — Progress Notes (Signed)
  Echocardiogram 2D Echocardiogram has been performed.  Rodrigo Ran 06/17/2022, 2:01 PM

## 2022-06-18 ENCOUNTER — Other Ambulatory Visit: Payer: Self-pay | Admitting: Cardiology

## 2022-06-18 ENCOUNTER — Observation Stay (INDEPENDENT_AMBULATORY_CARE_PROVIDER_SITE_OTHER): Payer: Medicare Other

## 2022-06-18 ENCOUNTER — Other Ambulatory Visit (HOSPITAL_COMMUNITY): Payer: Self-pay

## 2022-06-18 DIAGNOSIS — I5032 Chronic diastolic (congestive) heart failure: Secondary | ICD-10-CM

## 2022-06-18 DIAGNOSIS — I4891 Unspecified atrial fibrillation: Secondary | ICD-10-CM | POA: Diagnosis not present

## 2022-06-18 DIAGNOSIS — I480 Paroxysmal atrial fibrillation: Secondary | ICD-10-CM

## 2022-06-18 DIAGNOSIS — N179 Acute kidney failure, unspecified: Secondary | ICD-10-CM

## 2022-06-18 DIAGNOSIS — R42 Dizziness and giddiness: Secondary | ICD-10-CM

## 2022-06-18 DIAGNOSIS — K5792 Diverticulitis of intestine, part unspecified, without perforation or abscess without bleeding: Secondary | ICD-10-CM | POA: Diagnosis not present

## 2022-06-18 DIAGNOSIS — I739 Peripheral vascular disease, unspecified: Secondary | ICD-10-CM | POA: Diagnosis not present

## 2022-06-18 DIAGNOSIS — I25118 Atherosclerotic heart disease of native coronary artery with other forms of angina pectoris: Secondary | ICD-10-CM | POA: Diagnosis not present

## 2022-06-18 LAB — CBC WITH DIFFERENTIAL/PLATELET
Abs Immature Granulocytes: 0.03 10*3/uL (ref 0.00–0.07)
Basophils Absolute: 0 10*3/uL (ref 0.0–0.1)
Basophils Relative: 0 %
Eosinophils Absolute: 0.2 10*3/uL (ref 0.0–0.5)
Eosinophils Relative: 2 %
HCT: 32.2 % — ABNORMAL LOW (ref 36.0–46.0)
Hemoglobin: 10.6 g/dL — ABNORMAL LOW (ref 12.0–15.0)
Immature Granulocytes: 0 %
Lymphocytes Relative: 21 %
Lymphs Abs: 2 10*3/uL (ref 0.7–4.0)
MCH: 33.9 pg (ref 26.0–34.0)
MCHC: 32.9 g/dL (ref 30.0–36.0)
MCV: 102.9 fL — ABNORMAL HIGH (ref 80.0–100.0)
Monocytes Absolute: 0.7 10*3/uL (ref 0.1–1.0)
Monocytes Relative: 8 %
Neutro Abs: 6.4 10*3/uL (ref 1.7–7.7)
Neutrophils Relative %: 69 %
Platelets: 188 10*3/uL (ref 150–400)
RBC: 3.13 MIL/uL — ABNORMAL LOW (ref 3.87–5.11)
RDW: 13.2 % (ref 11.5–15.5)
WBC: 9.3 10*3/uL (ref 4.0–10.5)
nRBC: 0 % (ref 0.0–0.2)

## 2022-06-18 LAB — MAGNESIUM
Magnesium: 1.6 mg/dL — ABNORMAL LOW (ref 1.7–2.4)
Magnesium: 2.5 mg/dL — ABNORMAL HIGH (ref 1.7–2.4)

## 2022-06-18 LAB — BASIC METABOLIC PANEL
Anion gap: 7 (ref 5–15)
BUN: 9 mg/dL (ref 8–23)
CO2: 22 mmol/L (ref 22–32)
Calcium: 8.6 mg/dL — ABNORMAL LOW (ref 8.9–10.3)
Chloride: 110 mmol/L (ref 98–111)
Creatinine, Ser: 0.93 mg/dL (ref 0.44–1.00)
GFR, Estimated: 59 mL/min — ABNORMAL LOW (ref >60)
Glucose, Bld: 81 mg/dL (ref 70–99)
Potassium: 4.2 mmol/L (ref 3.5–5.1)
Sodium: 139 mmol/L (ref 135–145)

## 2022-06-18 LAB — TSH: TSH: 1.487 u[IU]/mL (ref 0.350–4.500)

## 2022-06-18 MED ORDER — METOPROLOL SUCCINATE ER 25 MG PO TB24
25.0000 mg | ORAL_TABLET | Freq: Every day | ORAL | 3 refills | Status: DC
  Filled 2022-06-18: qty 90, 90d supply, fill #0

## 2022-06-18 MED ORDER — LISINOPRIL-HYDROCHLOROTHIAZIDE 20-25 MG PO TABS: 0.5000 | ORAL_TABLET | Freq: Every morning | ORAL | Status: AC

## 2022-06-18 MED ORDER — AMOXICILLIN-POT CLAVULANATE 875-125 MG PO TABS: 1.0000 | ORAL_TABLET | Freq: Two times a day (BID) | ORAL | Status: DC

## 2022-06-18 MED ORDER — SACCHAROMYCES BOULARDII 250 MG PO CAPS
250.0000 mg | ORAL_CAPSULE | Freq: Two times a day (BID) | ORAL | 0 refills | Status: AC
  Filled 2022-06-18: qty 20, 10d supply, fill #0

## 2022-06-18 MED ORDER — MECLIZINE HCL 25 MG PO TABS
12.5000 mg | ORAL_TABLET | Freq: Three times a day (TID) | ORAL | Status: DC
  Administered 2022-06-18 (×2): 12.5 mg via ORAL
  Filled 2022-06-18 (×2): qty 1

## 2022-06-18 MED ORDER — MECLIZINE HCL 12.5 MG PO TABS
12.5000 mg | ORAL_TABLET | Freq: Three times a day (TID) | ORAL | 0 refills | Status: DC
  Filled 2022-06-18: qty 30, 10d supply, fill #0

## 2022-06-18 MED ORDER — APIXABAN 2.5 MG PO TABS
2.5000 mg | ORAL_TABLET | Freq: Two times a day (BID) | ORAL | 11 refills | Status: DC
  Filled 2022-06-18: qty 60, 30d supply, fill #0

## 2022-06-18 MED ORDER — AMOXICILLIN-POT CLAVULANATE 500-125 MG PO TABS
1.0000 | ORAL_TABLET | Freq: Two times a day (BID) | ORAL | 0 refills | Status: AC
  Filled 2022-06-18: qty 16, 8d supply, fill #0

## 2022-06-18 MED ORDER — AMOXICILLIN-POT CLAVULANATE 500-125 MG PO TABS: 1.0000 | ORAL_TABLET | Freq: Two times a day (BID) | ORAL | Status: DC

## 2022-06-18 MED ORDER — MAGNESIUM SULFATE 2 GM/50ML IV SOLN
2.0000 g | Freq: Once | INTRAVENOUS | Status: AC
Start: 2022-06-18 — End: 2022-06-18
  Administered 2022-06-18: 2 g via INTRAVENOUS
  Filled 2022-06-18: qty 50

## 2022-06-18 NOTE — Evaluation (Signed)
Physical Therapy Evaluation Patient Details Name: Sydney Gross MRN: [id number] DOB: 1930/02/17 Today's Date: 06/18/2022  History of Present Illness  Pt is an 88 y/o female admitted secondary to chest pain and SOB. Found to have a fib with RVR. PMH includes COPD, CAD, dCHF, HTN, and pulmonary HTN.  Clinical Impression  Pt admitted secondary to problem above with deficits below. Requiring min guard A for mobility tasks this session. Pt reporting some increase in weakness, so educated about using RW at home initially. Pt with minimal dizziness symptoms throughout mobility tasks. No nystagmus noted with position changes and orthostatics negative. Pt reports she feels she will be able to manage at home, and does not feel she will need follow up PT at d/c. Will continue to follow acutely to ensure functional mobility independence and safety.      Recommendations for follow up therapy are one component of a multi-disciplinary discharge planning process, led by the attending physician.  Recommendations may be updated based on patient status, additional functional criteria and insurance authorization.  Follow Up Recommendations No PT follow up (pt reports she does not feel she needs follow up at this time)      Assistance Recommended at Discharge Intermittent Supervision/Assistance  Patient can return home with the following  Assistance with cooking/housework;Assist for transportation    Equipment Recommendations None recommended by PT  Recommendations for Other Services       Functional Status Assessment Patient has had a recent decline in their functional status and demonstrates the ability to make significant improvements in function in a reasonable and predictable amount of time.     Precautions / Restrictions Precautions Precautions: Fall Restrictions Weight Bearing Restrictions: No      Mobility  Bed Mobility Overal bed mobility: Needs Assistance Bed Mobility: Supine to Sit,  Sit to Supine     Supine to sit: Supervision Sit to supine: Supervision   General bed mobility comments: supervision for safety. pt reporting mild dizziness upon sitting, but no nystagmus noted. Resolved quickly. BP WFL. Had pt return to supine to check for nystagmus and no nystagmus noted.    Transfers Overall transfer level: Needs assistance Equipment used: None Transfers: Sit to/from Stand Sit to Stand: Min guard           General transfer comment: Min guard for safety. Pt reports minimal symptoms upon standing. BP WFL    Ambulation/Gait Ambulation/Gait assistance: Min guard Gait Distance (Feet): 15 Feet Assistive device: None Gait Pattern/deviations: Step-through pattern, Decreased stride length Gait velocity: Decreased     General Gait Details: ambulated short distance in the room, as pt's lunch arrived and pt wanting to eat. Reports some weakness and mild instability, but no overt LOB. Educated about using RW initially for increased safety.  Stairs            Wheelchair Mobility    Modified Rankin (Stroke Patients Only)       Balance Overall balance assessment: Needs assistance Sitting-balance support: No upper extremity supported, Feet supported Sitting balance-Leahy Scale: Good     Standing balance support: No upper extremity supported Standing balance-Leahy Scale: Fair                               Pertinent Vitals/Pain Pain Assessment Pain Assessment: No/denies pain    Home Living Family/patient expects to be discharged to:: Private residence Living Arrangements: Spouse/significant other Available Help at Discharge: Family Type of Home: House  Home Access: Stairs to enter Entrance Stairs-Rails: Right;Left;Can reach both Entrance Stairs-Number of Steps: 6   Home Layout: One level Home Equipment: Grab bars - tub/shower;Shower Counsellor (2 wheels);Cane - single point      Prior Function Prior Level of Function :  Independent/Modified Independent;Driving                     Hand Dominance        Extremity/Trunk Assessment   Upper Extremity Assessment Upper Extremity Assessment: Generalized weakness    Lower Extremity Assessment Lower Extremity Assessment: Generalized weakness    Cervical / Trunk Assessment Cervical / Trunk Assessment: Normal  Communication   Communication: No difficulties  Cognition Arousal/Alertness: Awake/alert Behavior During Therapy: WFL for tasks assessed/performed Overall Cognitive Status: Within Functional Limits for tasks assessed                                          General Comments General comments (skin integrity, edema, etc.): Pt's husband present. Orthostatics negative; see vitals flowsheet. No nystagmus noted with position changes.    Exercises     Assessment/Plan    PT Assessment Patient needs continued PT services  PT Problem List Decreased strength;Decreased activity tolerance;Decreased balance;Decreased mobility       PT Treatment Interventions DME instruction;Gait training;Functional mobility training;Stair training;Therapeutic activities;Therapeutic exercise;Balance training;Patient/family education    PT Goals (Current goals can be found in the Care Plan section)  Acute Rehab PT Goals Patient Stated Goal: to go home PT Goal Formulation: With patient Time For Goal Achievement: 07/02/22 Potential to Achieve Goals: Good    Frequency Min 3X/week     Co-evaluation               AM-PAC PT "6 Clicks" Mobility  Outcome Measure Help needed turning from your back to your side while in a flat bed without using bedrails?: None Help needed moving from lying on your back to sitting on the side of a flat bed without using bedrails?: None Help needed moving to and from a bed to a chair (including a wheelchair)?: A Little Help needed standing up from a chair using your arms (e.g., wheelchair or bedside chair)?:  A Little Help needed to walk in hospital room?: A Little Help needed climbing 3-5 steps with a railing? : A Little 6 Click Score: 20    End of Session   Activity Tolerance: Patient tolerated treatment well Patient left: in bed;with call bell/phone within reach;with family/visitor present (sitting EOB) Nurse Communication: Mobility status PT Visit Diagnosis: Unsteadiness on feet (R26.81);Muscle weakness (generalized) (M62.81)    Time: 2202-5427 PT Time Calculation (min) (ACUTE ONLY): 17 min   Charges:   PT Evaluation $PT Eval Low Complexity: 1 Low          Farley Ly, PT, DPT  Acute Rehabilitation Services  Office: [phone]   Lehman Prom 06/18/2022, 11:50 AM

## 2022-06-18 NOTE — Progress Notes (Signed)
[facility]                                                                               Sydney Gross, is a 88 y.o. female, DOB - [birth year], [id number] Admit date - 06/16/2022    Outpatient Primary MD for the patient is Mayra Neer, MD  LOS - 0  days    Brief summary     Sydney Gross is a 88 y.o. female with past medical history of hypertension, hyperlipidemia, COPD, chronic respiratory failure on 2 L nasal cannula oxygen presents with complaints of chest pain found to be in A-fib with RVR.  She reported having centralized chest pain that moved to the left side of her chest yesterday evening.  She had been having some pain in her left side 1 to 2 months ago for which she had been evaluated by her gastroenterologist Dr. Collene Mares .  Due to the chest discomfort she had been seen at the local fire department and was found to be in atrial fibrillation with heart rates in the 150s to 200s for which EMS was called.  She denies having any significant shortness of breath.  Patient was started on diltiazem and Eliquis.  Patient spontaneously converted into normal sinus rhythm.  Overnight patient developed progressively worsening lower abdominal pain with distention.  She was having to have a bowel movement last night and still is able to pass flatus.    Assessment & Plan    Assessment and Plan:   New onset atrial fibrillation:  Converted to sinus with cardizem.  Further management as per cardiology.    Acute diverticulitis of the sigmoid colon:  - no nausea, vomiting or diarrhea. Able to tolerated Heart healthy diet without any symptoms.  - minimal abdominal pain in the LLQ.  - No signs of peritonitis on exam.  - can be discharged on Augmentin to complete a course of  10 to 14 day treatment.  - recommend outpatient follow up with DR Collene Mares for possible sigmoidoscopy in 2 weeks.  - she remains afebrile and leukocytosis resolved.   AKI Resolved.     Hypomagnesemia:  Replace.    Hyperlipidemia:  Resume zetia and Repatha.    Hypertension:  Well controlled.  Orthostatic vital signs negative.   Dizziness / Vertigo.  She was started on Meclizine with improvement of symptoms.  She denies vertigo when working with PT today.  Dizziness is minimal and orthostatic vital signs are negative.  No focal deficits.  If she continues to have persistent dizziness, recommend outpatient follow up with PCP for MRI brain to evaluate posterior circulation.    COPD;  No wheezing heard.    Estimated body mass index is 21.21 kg/m as calculated from the following:   Height as of this encounter: 4\' 11"  (1.499 m).   Weight as of this encounter: 47.6 kg.  Code Status: PARTIAL CODE.  DVT Prophylaxis:  apixaban (ELIQUIS) tablet 2.5 mg Start: 06/17/22 1000 apixaban (ELIQUIS) tablet 2.5 mg    Antimicrobials:   Anti-infectives (From admission, onward)    Start     Dose/Rate Route Frequency Ordered Stop   06/17/22 1045  cefTRIAXone (ROCEPHIN) 2 g  in sodium chloride 0.9 % 100 mL IVPB        2 g 200 mL/hr over 30 Minutes Intravenous Every 24 hours 06/17/22 1044     06/17/22 1045  metroNIDAZOLE (FLAGYL) IVPB 500 mg        500 mg 100 mL/hr over 60 Minutes Intravenous Every 12 hours 06/17/22 1044          Medications  Scheduled Meds:  apixaban  2.5 mg Oral BID   docusate sodium  200 mg Oral BID   ezetimibe  10 mg Oral QHS   magnesium chloride  1 tablet Oral BID   meclizine  12.5 mg Oral TID   metoprolol succinate  25 mg Oral Daily   saccharomyces boulardii  250 mg Oral BID   Continuous Infusions:  cefTRIAXone (ROCEPHIN)  IV Stopped (06/17/22 1154)   magnesium sulfate bolus IVPB     metronidazole 500 mg (06/17/22 2301)   PRN Meds:.acetaminophen, HYDROcodone-acetaminophen, ipratropium-albuterol, melatonin, nitroGLYCERIN, ondansetron (ZOFRAN) IV    Subjective:   Sydney Gross was seen and examined today.  Minimal dizziness  and no vertigo when working with PT.  Able to tolerate HHDiet without any nausea, vomiting or worsening abdominal pain.  No bowel movement today.   Objective:   Vitals:   06/17/22 2021 06/18/22 0416 06/18/22 0424 06/18/22 0819  BP:   (!) 130/58 (!) 146/69  Pulse: 94  90 94  Resp:  18 18 17   Temp:    98.9 F (37.2 C)  TempSrc:    Oral  SpO2: 97%  92% 92%  Weight:      Height:        Intake/Output Summary (Last 24 hours) at 06/18/2022 1159 Last data filed at 06/18/2022 0426 Gross per 24 hour  Intake 280.12 ml  Output 600 ml  Net -319.88 ml   Filed Weights   06/16/22 1624  Weight: 47.6 kg     Exam General: Alert and oriented x 3, NAD Cardiovascular: S1 S2 auscultated, no murmurs, RRR Respiratory: Clear to auscultation bilaterally, no wheezing, rales or rhonchi Gastrointestinal: Soft, nontender, nondistended, + bowel sounds Ext: no pedal edema bilaterally Neuro: AAOx3, Cr N's II- XII. Strength 5/5 upper and lower extremities bilaterally Skin: No rashes Psych: Normal affect and demeanor, alert and oriented x3    Data Reviewed:  I have personally reviewed following labs and imaging studies   CBC Lab Results  Component Value Date   WBC 9.3 06/18/2022   RBC 3.13 (L) 06/18/2022   HGB 10.6 (L) 06/18/2022   HCT 32.2 (L) 06/18/2022   MCV 102.9 (H) 06/18/2022   MCH 33.9 06/18/2022   PLT 188 06/18/2022   MCHC 32.9 06/18/2022   RDW 13.2 06/18/2022   LYMPHSABS 2.0 06/18/2022   MONOABS 0.7 06/18/2022   EOSABS 0.2 06/18/2022   BASOSABS 0.0 [id number]     Last metabolic panel Lab Results  Component Value Date   NA 139 06/18/2022   K 4.2 06/18/2022   CL 110 06/18/2022   CO2 22 06/18/2022   BUN 9 06/18/2022   CREATININE 0.93 06/18/2022   GLUCOSE 81 06/18/2022   GFRNONAA 59 (L) 06/18/2022   GFRAA 36 (L) 01/29/2019   CALCIUM 8.6 (L) 06/18/2022   PHOS 3.1 10/06/2021   PROT 6.1 (L) 10/06/2021   ALBUMIN 3.8 10/06/2021   LABGLOB 1.7 05/11/2021   AGRATIO 2.8 (H)  05/11/2021   BILITOT 0.5 10/06/2021   ALKPHOS 45 10/06/2021   AST 19 10/06/2021   ALT 14 10/06/2021  ANIONGAP 7 06/18/2022    CBG (last 3)  No results for input(s): "GLUCAP" in the last 72 hours.    Coagulation Profile: No results for input(s): "INR", "PROTIME" in the last 168 hours.   Radiology Studies: ECHOCARDIOGRAM COMPLETE  Result Date: 06/17/2022    ECHOCARDIOGRAM REPORT   Patient Name:   Sydney Gross Date of Exam: 06/17/2022 Medical Rec #:  [id number]         Height:       59.0 in Accession #:    [id number]        Weight:       105.0 lb Date of Birth:  1930/08/12         BSA:          1.403 m Patient Age:    21 years          BP:           154/66 mmHg Patient Gender: F                 HR:           80 bpm. Exam Location:  Inpatient Procedure: 2D Echo, Cardiac Doppler and Color Doppler Indications:    Afib  History:        Patient has prior history of Echocardiogram examinations, most                 recent 09/29/2021. CAD, COPD, Arrythmias:Atrial Fibrillation;                 Risk Factors:Hypertension and HLD.  Sonographer:    Joette Catching RCS Referring Phys: [id number] [place]  1. Left ventricular ejection fraction, by estimation, is 65 to 70%. The left ventricle has normal function. The left ventricle has no regional wall motion abnormalities. Moderate asymmetric focal basal septal left ventricular hypertrophy. There was turbulence in the LV outflow tract but no significant LVOT gradient was measured. No mitral valve SAM noted. Left ventricular diastolic parameters are consistent with Grade II diastolic dysfunction (pseudonormalization).  2. Right ventricular systolic function is normal. The right ventricular size is normal. There is moderately elevated pulmonary artery systolic pressure. The estimated right ventricular systolic pressure is 0000000 mmHg.  3. Left atrial size was mildly dilated.  4. The mitral valve is normal in structure. Mild mitral valve  regurgitation. No evidence of mitral stenosis.  5. The aortic valve is tricuspid. There is mild calcification of the aortic valve. Aortic valve regurgitation is mild. No aortic stenosis is present.  6. The inferior vena cava is normal in size with greater than 50% respiratory variability, suggesting right atrial pressure of 3 mmHg. FINDINGS  Left Ventricle: Left ventricular ejection fraction, by estimation, is 65 to 70%. The left ventricle has normal function. The left ventricle has no regional wall motion abnormalities. The left ventricular internal cavity size was normal in size. Moderate  asymmetric focal basal septal left ventricular hypertrophy. Left ventricular diastolic parameters are consistent with Grade II diastolic dysfunction (pseudonormalization). Right Ventricle: The right ventricular size is normal. No increase in right ventricular wall thickness. Right ventricular systolic function is normal. There is moderately elevated pulmonary artery systolic pressure. The tricuspid regurgitant velocity is 3.34 m/s, and with an assumed right atrial pressure of 3 mmHg, the estimated right ventricular systolic pressure is 0000000 mmHg. Left Atrium: Left atrial size was mildly dilated. Right Atrium: Right atrial size was normal in size. Pericardium: Trivial pericardial effusion is present. Mitral Valve: The mitral  valve is normal in structure. There is mild calcification of the mitral valve leaflet(s). Mild mitral annular calcification. Mild mitral valve regurgitation. No evidence of mitral valve stenosis. Tricuspid Valve: The tricuspid valve is normal in structure. Tricuspid valve regurgitation is trivial. Aortic Valve: The aortic valve is tricuspid. There is mild calcification of the aortic valve. Aortic valve regurgitation is mild. Aortic regurgitation PHT measures 311 msec. No aortic stenosis is present. Aortic valve mean gradient measures 6.0 mmHg. Aortic valve peak gradient measures 11.8 mmHg. Aortic valve area,  by VTI measures 2.61 cm. Pulmonic Valve: The pulmonic valve was normal in structure. Pulmonic valve regurgitation is trivial. Aorta: The aortic root is normal in size and structure. Venous: The inferior vena cava is normal in size with greater than 50% respiratory variability, suggesting right atrial pressure of 3 mmHg. IAS/Shunts: No atrial level shunt detected by color flow Doppler.  LEFT VENTRICLE PLAX 2D LVIDd:         3.20 cm     Diastology LVIDs:         2.20 cm     LV e' medial:    6.12 cm/s LV PW:         1.10 cm     LV E/e' medial:  17.5 LV IVS:        1.40 cm     LV e' lateral:   5.63 cm/s LVOT diam:     1.90 cm     LV E/e' lateral: 19.0 LV SV:         95 LV SV Index:   68 LVOT Area:     2.84 cm  LV Volumes (MOD) LV vol d, MOD A2C: 36.5 ml LV vol d, MOD A4C: 31.6 ml LV vol s, MOD A2C: 15.5 ml LV vol s, MOD A4C: 13.7 ml LV SV MOD A2C:     21.0 ml LV SV MOD A4C:     31.6 ml LV SV MOD BP:      19.4 ml RIGHT VENTRICLE             IVC RV Basal diam:  2.60 cm     IVC diam: 1.30 cm RV Mid diam:    1.60 cm RV S prime:     15.00 cm/s TAPSE (M-mode): 1.8 cm LEFT ATRIUM             Index        RIGHT ATRIUM          Index LA diam:        3.10 cm 2.21 cm/m   RA Area:     9.59 cm LA Vol (A2C):   34.6 ml 24.67 ml/m  RA Volume:   16.00 ml 11.41 ml/m LA Vol (A4C):   43.9 ml 31.30 ml/m LA Biplane Vol: 39.6 ml 28.23 ml/m  AORTIC VALVE                     PULMONIC VALVE AV Area (Vmax):    2.65 cm      PV Vmax:          1.40 m/s AV Area (Vmean):   2.76 cm      PV Peak grad:     7.8 mmHg AV Area (VTI):     2.61 cm      PR End Diast Vel: 8.41 msec AV Vmax:           172.00 cm/s AV Vmean:  118.000 cm/s AV VTI:            0.364 m AV Peak Grad:      11.8 mmHg AV Mean Grad:      6.0 mmHg LVOT Vmax:         161.00 cm/s LVOT Vmean:        115.000 cm/s LVOT VTI:          0.335 m LVOT/AV VTI ratio: 0.92 AI PHT:            311 msec  AORTA Ao Root diam: 2.60 cm Ao Asc diam:  2.60 cm MITRAL VALVE                 TRICUSPID VALVE MV Area (PHT): 2.83 cm     TR Peak grad:   44.6 mmHg MV Decel Time: 268 msec     TR Vmax:        334.00 cm/s MR Peak grad: 114.9 mmHg MR Vmax:      536.00 cm/s   SHUNTS MV E velocity: 107.00 cm/s  Systemic VTI:  0.34 m MV A velocity: 91.30 cm/s   Systemic Diam: 1.90 cm MV E/A ratio:  1.17 Dalton McleanMD Electronically signed by Wilfred Lacy Signature Date/Time: 06/17/2022/4:12:37 PM    Final    CT ABDOMEN PELVIS W CONTRAST  Result Date: 06/17/2022 CLINICAL DATA:  88 year old female history of acute onset of nonlocalized abdominal pain. EXAM: CT ABDOMEN AND PELVIS WITH CONTRAST TECHNIQUE: Multidetector CT imaging of the abdomen and pelvis was performed using the standard protocol following bolus administration of intravenous contrast. RADIATION DOSE REDUCTION: This exam was performed according to the departmental dose-optimization program which includes automated exposure control, adjustment of the mA and/or kV according to patient size and/or use of iterative reconstruction technique. CONTRAST:  OMNIPAQUE IOHEXOL 300 MG/ML  SOLN COMPARISON:  CT the abdomen and pelvis 03/19/2022. FINDINGS: Lower chest: Mild cardiomegaly. Atherosclerotic calcifications in the thoracic aorta. Hepatobiliary: Liver has a shrunken appearance and nodular contour, indicative of underlying cirrhosis. Subcentimeter low-attenuation lesion in segment 2 of the liver (axial image 15 of series 3), too small to characterize, but statistically likely a tiny cyst. No other suspicious cystic or solid hepatic lesions. No intra or extrahepatic biliary ductal dilatation. Status post cholecystectomy. Pancreas: No pancreatic mass. No pancreatic ductal dilatation. No pancreatic or peripancreatic fluid collections or inflammatory changes. Spleen: Unremarkable. Adrenals/Urinary Tract: 4.6 cm exophytic low-attenuation nonenhancing lesion in the lower pole of the right kidney is compatible with a simple cyst. 9 mm fatty attenuation  lesion in the medial aspect of the interpolar region of the left kidney is compatible with a small angiomyolipoma. Moderate atrophy and extensive cortical thinning in the left kidney. No hydroureteronephrosis. Urinary bladder is moderately distended, but is otherwise normal in appearance. Stomach/Bowel: The appearance of the stomach is normal. There is no pathologic dilatation of small bowel or colon. Numerous colonic diverticulae are noted. In the region of the sigmoid colon there are surrounding inflammatory changes indicative of an acute diverticulitis. No discrete diverticular abscess or signs of frank perforation or confidently identified at this time. Normal appendix. Vascular/Lymphatic: Aortic atherosclerosis including fusiform ectasia of the infrarenal abdominal aorta which measures up to 2.9 x 2.3 cm. 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 Vascular stents in the left common iliac artery and left external iliac artery, which appear patent. No lymphadenopathy noted in the abdomen or pelvis. Reproductive: Status  post hysterectomy. Left ovary is not confidently identified may be surgically absent or atrophic. Right ovary is unremarkable in appearance. Other: No significant volume of ascites.  No pneumoperitoneum. Musculoskeletal: There are no aggressive appearing lytic or blastic lesions noted in the visualized portions of the skeleton. IMPRESSION: 1. Acute diverticulitis of the sigmoid colon. No definite diverticular abscess or signs of frank perforation are noted at this time. 2. Cirrhosis. 3. 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 4. 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 5. Moderate atrophy and cortical thinning in the left kidney. 6. Additional incidental findings, as above. Electronically  Signed   By: Trudie Reed M.D.   On: 06/17/2022 09:32   DG Abd 2 Views  Result Date: 06/17/2022 CLINICAL DATA:  88 year old female with abdominal distension. EXAM: ABDOMEN - 2 VIEW COMPARISON:  CT Abdomen and Pelvis 03/19/2022 and earlier. FINDINGS: Upright and supine views at 0606 hours. No pneumoperitoneum. Grossly negative lung bases. Non obstructed bowel gas pattern. Chronic left iliac artery stents. Aortoiliac calcified atherosclerosis. Stable cholecystectomy clips. Incidental pelvic phleboliths. Abdominal and pelvic visceral contours appear stable to the prior CT. Moderate dextroconvex lumbar scoliosis and degeneration. No acute osseous abnormality identified. IMPRESSION: 1.  Normal bowel gas pattern, no free air. 2. Aortic atherosclerosis with chronic left iliac stenting. Electronically Signed   By: Odessa Fleming M.D.   On: 06/17/2022 07:22   DG Chest Portable 1 View  Result Date: 06/16/2022 CLINICAL DATA:  Chest pain EXAM: PORTABLE CHEST 1 VIEW COMPARISON:  Chest x-ray dated October 05, 2021 FINDINGS: Patient is rotated to the right. Unchanged cardiac and mediastinal contours. Mild bibasilar linear opacities, likely due to atelectasis. Lungs otherwise clear. No large pleural effusion or evidence of pneumothorax. Moderate levocurvature of the thoracic spine, unchanged when compared to prior. IMPRESSION: Mild bibasilar atelectasis, lungs otherwise clear. Electronically Signed   By: Allegra Lai M.D.   On: 06/16/2022 16:57       Kathlen Mody M.D. [facility] 06/18/2022, 11:59 AM  Available via Epic secure chat 7am-7pm After 7 pm, please refer to night coverage provider listed on amion.

## 2022-06-18 NOTE — Progress Notes (Unsigned)
Enrolled patient for a 14 day Zio XT monitor to be mailed to patients home  ° °Dr smith to read °

## 2022-06-18 NOTE — Progress Notes (Signed)
Progress Note  Patient Name: Sydney Gross Date of Encounter: 06/18/2022  [facility] Cardiologist: Sinclair Grooms, MD   Subjective   Maintaining normal sinus rhythm on telemetry.  This morning and late yesterday she began experiencing nausea.  Also complaining of dizziness as if her head is spinning.  Wants to eat.  Abdominal distention has decreased.  Inpatient Medications    Scheduled Meds:  apixaban  2.5 mg Oral BID   docusate sodium  200 mg Oral BID   ezetimibe  10 mg Oral QHS   magnesium chloride  1 tablet Oral BID   metoprolol succinate  25 mg Oral Daily   saccharomyces boulardii  250 mg Oral BID   Continuous Infusions:  cefTRIAXone (ROCEPHIN)  IV Stopped (06/17/22 1154)   metronidazole 500 mg (06/17/22 2301)   PRN Meds: acetaminophen, HYDROcodone-acetaminophen, ipratropium-albuterol, melatonin, nitroGLYCERIN, ondansetron (ZOFRAN) IV   Vital Signs    Vitals:   06/17/22 2021 06/18/22 0416 06/18/22 0424 06/18/22 0819  BP:   (!) 130/58 (!) 146/69  Pulse: 94  90 94  Resp:  18 18 17   Temp:    98.9 F (37.2 C)  TempSrc:    Oral  SpO2: 97%  92% 92%  Weight:      Height:        Intake/Output Summary (Last 24 hours) at 06/18/2022 0838 Last data filed at 06/18/2022 0426 Gross per 24 hour  Intake 280.12 ml  Output 600 ml  Net -319.88 ml      06/16/2022    4:24 PM 05/25/2022    3:21 PM 12/01/2021    9:52 AM  Last 3 Weights  Weight (lbs) 105 lb 110 lb 108 lb  Weight (kg) 47.628 kg 49.896 kg 48.988 kg      Telemetry    Normal sinus rhythm with heart rate approximately 100 bpm.- Personally Reviewed  ECG    NSR, HR 85 BPM - Personally Reviewed  Physical Exam   GEN: No acute distress.  Laying flat in the bed  Neck: No JVD Cardiac: RRR, grade 2/6 systolic murmur at left/right upper sternal boarder  Respiratory: Expiratory wheezes  GI: Abdomen is no longer distended.  Bowel sounds are present.  The patient is hungry. MS: No edema; No  deformity. Neuro:  Nonfocal  Psych: Normal affect   Labs    High Sensitivity Troponin:   Recent Labs  Lab 06/16/22 1643 06/16/22 1856  TROPONINIHS 37* 96*     Chemistry Recent Labs  Lab 06/16/22 1643 06/16/22 1654 06/16/22 1856 06/17/22 0522  NA 142 140  --  140  K 4.7 4.5  --  4.2  CL 109 108  --  112*  CO2 21*  --   --  22  GLUCOSE 149* 146*  --  100*  BUN 33* 32*  --  22  CREATININE 1.57* 1.60*  --  0.96  CALCIUM 9.4  --   --  8.4*  MG  --   --  1.9  --   GFRNONAA 31*  --   --  57*  ANIONGAP 12  --   --  6    Lipids No results for input(s): "CHOL", "TRIG", "HDL", "LABVLDL", "LDLCALC", "CHOLHDL" in the last 168 hours.  Hematology Recent Labs  Lab 06/16/22 1643 06/16/22 1654 06/17/22 0522  WBC 10.7*  --  11.5*  RBC 3.39*  --  3.25*  HGB 11.5* 11.6* 11.0*  HCT 35.3* 34.0* 34.7*  MCV 104.1*  --  106.8*  MCH 33.9  --  33.8  MCHC 32.6  --  31.7  RDW 13.5  --  13.3  PLT 202  --  185   Thyroid  Recent Labs  Lab 06/18/22 0445  TSH 1.487    BNP Recent Labs  Lab 06/17/22 0522  BNP 319.2*    DDimer No results for input(s): "DDIMER" in the last 168 hours.   Radiology    ECHOCARDIOGRAM COMPLETE  Result Date: 06/17/2022    ECHOCARDIOGRAM REPORT   Patient Name:   Sydney Gross Date of Exam: 06/17/2022 Medical Rec #:  [id number]         Height:       59.0 in Accession #:    [id number]        Weight:       105.0 lb Date of Birth:  1930-10-28         BSA:          1.403 m Patient Age:    88 years          BP:           154/66 mmHg Patient Gender: F                 HR:           80 bpm. Exam Location:  Inpatient Procedure: 2D Echo, Cardiac Doppler and Color Doppler Indications:    Afib  History:        Patient has prior history of Echocardiogram examinations, most                 recent 09/29/2021. CAD, COPD, Arrythmias:Atrial Fibrillation;                 Risk Factors:Hypertension and HLD.  Sonographer:    Joette Catching RCS Referring Phys: [id number] [place]  1. Left ventricular ejection fraction, by estimation, is 65 to 70%. The left ventricle has normal function. The left ventricle has no regional wall motion abnormalities. Moderate asymmetric focal basal septal left ventricular hypertrophy. There was turbulence in the LV outflow tract but no significant LVOT gradient was measured. No mitral valve SAM noted. Left ventricular diastolic parameters are consistent with Grade II diastolic dysfunction (pseudonormalization).  2. Right ventricular systolic function is normal. The right ventricular size is normal. There is moderately elevated pulmonary artery systolic pressure. The estimated right ventricular systolic pressure is 0000000 mmHg.  3. Left atrial size was mildly dilated.  4. The mitral valve is normal in structure. Mild mitral valve regurgitation. No evidence of mitral stenosis.  5. The aortic valve is tricuspid. There is mild calcification of the aortic valve. Aortic valve regurgitation is mild. No aortic stenosis is present.  6. The inferior vena cava is normal in size with greater than 50% respiratory variability, suggesting right atrial pressure of 3 mmHg. FINDINGS  Left Ventricle: Left ventricular ejection fraction, by estimation, is 65 to 70%. The left ventricle has normal function. The left ventricle has no regional wall motion abnormalities. The left ventricular internal cavity size was normal in size. Moderate  asymmetric focal basal septal left ventricular hypertrophy. Left ventricular diastolic parameters are consistent with Grade II diastolic dysfunction (pseudonormalization). Right Ventricle: The right ventricular size is normal. No increase in right ventricular wall thickness. Right ventricular systolic function is normal. There is moderately elevated pulmonary artery systolic pressure. The tricuspid regurgitant velocity is 3.34 m/s, and with an assumed right atrial pressure of 3 mmHg, the  estimated right ventricular systolic  pressure is 47.6 mmHg. Left Atrium: Left atrial size was mildly dilated. Right Atrium: Right atrial size was normal in size. Pericardium: Trivial pericardial effusion is present. Mitral Valve: The mitral valve is normal in structure. There is mild calcification of the mitral valve leaflet(s). Mild mitral annular calcification. Mild mitral valve regurgitation. No evidence of mitral valve stenosis. Tricuspid Valve: The tricuspid valve is normal in structure. Tricuspid valve regurgitation is trivial. Aortic Valve: The aortic valve is tricuspid. There is mild calcification of the aortic valve. Aortic valve regurgitation is mild. Aortic regurgitation PHT measures 311 msec. No aortic stenosis is present. Aortic valve mean gradient measures 6.0 mmHg. Aortic valve peak gradient measures 11.8 mmHg. Aortic valve area, by VTI measures 2.61 cm. Pulmonic Valve: The pulmonic valve was normal in structure. Pulmonic valve regurgitation is trivial. Aorta: The aortic root is normal in size and structure. Venous: The inferior vena cava is normal in size with greater than 50% respiratory variability, suggesting right atrial pressure of 3 mmHg. IAS/Shunts: No atrial level shunt detected by color flow Doppler.  LEFT VENTRICLE PLAX 2D LVIDd:         3.20 cm     Diastology LVIDs:         2.20 cm     LV e' medial:    6.12 cm/s LV PW:         1.10 cm     LV E/e' medial:  17.5 LV IVS:        1.40 cm     LV e' lateral:   5.63 cm/s LVOT diam:     1.90 cm     LV E/e' lateral: 19.0 LV SV:         95 LV SV Index:   68 LVOT Area:     2.84 cm  LV Volumes (MOD) LV vol d, MOD A2C: 36.5 ml LV vol d, MOD A4C: 31.6 ml LV vol s, MOD A2C: 15.5 ml LV vol s, MOD A4C: 13.7 ml LV SV MOD A2C:     21.0 ml LV SV MOD A4C:     31.6 ml LV SV MOD BP:      19.4 ml RIGHT VENTRICLE             IVC RV Basal diam:  2.60 cm     IVC diam: 1.30 cm RV Mid diam:    1.60 cm RV S prime:     15.00 cm/s TAPSE (M-mode): 1.8 cm LEFT ATRIUM             Index        RIGHT ATRIUM           Index LA diam:        3.10 cm 2.21 cm/m   RA Area:     9.59 cm LA Vol (A2C):   34.6 ml 24.67 ml/m  RA Volume:   16.00 ml 11.41 ml/m LA Vol (A4C):   43.9 ml 31.30 ml/m LA Biplane Vol: 39.6 ml 28.23 ml/m  AORTIC VALVE                     PULMONIC VALVE AV Area (Vmax):    2.65 cm      PV Vmax:          1.40 m/s AV Area (Vmean):   2.76 cm      PV Peak grad:     7.8 mmHg AV Area (VTI):     2.61  cm      PR End Diast Vel: 8.41 msec AV Vmax:           172.00 cm/s AV Vmean:          118.000 cm/s AV VTI:            0.364 m AV Peak Grad:      11.8 mmHg AV Mean Grad:      6.0 mmHg LVOT Vmax:         161.00 cm/s LVOT Vmean:        115.000 cm/s LVOT VTI:          0.335 m LVOT/AV VTI ratio: 0.92 AI PHT:            311 msec  AORTA Ao Root diam: 2.60 cm Ao Asc diam:  2.60 cm MITRAL VALVE                TRICUSPID VALVE MV Area (PHT): 2.83 cm     TR Peak grad:   44.6 mmHg MV Decel Time: 268 msec     TR Vmax:        334.00 cm/s MR Peak grad: 114.9 mmHg MR Vmax:      536.00 cm/s   SHUNTS MV E velocity: 107.00 cm/s  Systemic VTI:  0.34 m MV A velocity: 91.30 cm/s   Systemic Diam: 1.90 cm MV E/A ratio:  1.17 Dalton McleanMD Electronically signed by Franki Monte Signature Date/Time: 06/17/2022/4:12:37 PM    Final    CT ABDOMEN PELVIS W CONTRAST  Result Date: 06/17/2022 CLINICAL DATA:  88 year old female history of acute onset of nonlocalized abdominal pain. EXAM: CT ABDOMEN AND PELVIS WITH CONTRAST TECHNIQUE: Multidetector CT imaging of the abdomen and pelvis was performed using the standard protocol following bolus administration of intravenous contrast. RADIATION DOSE REDUCTION: This exam was performed according to the departmental dose-optimization program which includes automated exposure control, adjustment of the mA and/or kV according to patient size and/or use of iterative reconstruction technique. CONTRAST:  143mL OMNIPAQUE IOHEXOL 300 MG/ML  SOLN COMPARISON:  CT the abdomen and pelvis 03/19/2022. FINDINGS:  Lower chest: Mild cardiomegaly. Atherosclerotic calcifications in the thoracic aorta. Hepatobiliary: Liver has a shrunken appearance and nodular contour, indicative of underlying cirrhosis. Subcentimeter low-attenuation lesion in segment 2 of the liver (axial image 15 of series 3), too small to characterize, but statistically likely a tiny cyst. No other suspicious cystic or solid hepatic lesions. No intra or extrahepatic biliary ductal dilatation. Status post cholecystectomy. Pancreas: No pancreatic mass. No pancreatic ductal dilatation. No pancreatic or peripancreatic fluid collections or inflammatory changes. Spleen: Unremarkable. Adrenals/Urinary Tract: 4.6 cm exophytic low-attenuation nonenhancing lesion in the lower pole of the right kidney is compatible with a simple cyst. 9 mm fatty attenuation lesion in the medial aspect of the interpolar region of the left kidney is compatible with a small angiomyolipoma. Moderate atrophy and extensive cortical thinning in the left kidney. No hydroureteronephrosis. Urinary bladder is moderately distended, but is otherwise normal in appearance. Stomach/Bowel: The appearance of the stomach is normal. There is no pathologic dilatation of small bowel or colon. Numerous colonic diverticulae are noted. In the region of the sigmoid colon there are surrounding inflammatory changes indicative of an acute diverticulitis. No discrete diverticular abscess or signs of frank perforation or confidently identified at this time. Normal appendix. Vascular/Lymphatic: Aortic atherosclerosis including fusiform ectasia of the infrarenal abdominal aorta which measures up to 2.9 x 2.3 cm. 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 Vascular stents in the left common iliac artery and left external iliac artery, which appear patent. No lymphadenopathy noted in the abdomen or pelvis. Reproductive: Status post hysterectomy. Left  ovary is not confidently identified may be surgically absent or atrophic. Right ovary is unremarkable in appearance. Other: No significant volume of ascites.  No pneumoperitoneum. Musculoskeletal: There are no aggressive appearing lytic or blastic lesions noted in the visualized portions of the skeleton. IMPRESSION: 1. Acute diverticulitis of the sigmoid colon. No definite diverticular abscess or signs of frank perforation are noted at this time. 2. Cirrhosis. 3. 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 4. 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 5. Moderate atrophy and cortical thinning in the left kidney. 6. Additional incidental findings, as above. Electronically Signed   By: Trudie Reed M.D.   On: 06/17/2022 09:32   DG Abd 2 Views  Result Date: 06/17/2022 CLINICAL DATA:  88 year old female with abdominal distension. EXAM: ABDOMEN - 2 VIEW COMPARISON:  CT Abdomen and Pelvis 03/19/2022 and earlier. FINDINGS: Upright and supine views at 0606 hours. No pneumoperitoneum. Grossly negative lung bases. Non obstructed bowel gas pattern. Chronic left iliac artery stents. Aortoiliac calcified atherosclerosis. Stable cholecystectomy clips. Incidental pelvic phleboliths. Abdominal and pelvic visceral contours appear stable to the prior CT. Moderate dextroconvex lumbar scoliosis and degeneration. No acute osseous abnormality identified. IMPRESSION: 1.  Normal bowel gas pattern, no free air. 2. Aortic atherosclerosis with chronic left iliac stenting. Electronically Signed   By: Odessa Fleming M.D.   On: 06/17/2022 07:22   DG Chest Portable 1 View  Result Date: 06/16/2022 CLINICAL DATA:  Chest pain EXAM: PORTABLE CHEST 1 VIEW COMPARISON:  Chest x-ray dated October 05, 2021 FINDINGS: Patient is rotated to the right. Unchanged cardiac and mediastinal contours. Mild bibasilar linear  opacities, likely due to atelectasis. Lungs otherwise clear. No large pleural effusion or evidence of pneumothorax. Moderate levocurvature of the thoracic spine, unchanged when compared to prior. IMPRESSION: Mild bibasilar atelectasis, lungs otherwise clear. Electronically Signed   By: Allegra Lai M.D.   On: 06/16/2022 16:57    Cardiac Studies   Echocardiogram pending   Patient Profile     88 y.o. female with a history of CAD s/p DES to LAD and diagonal 2019, PAD, ectatic abdominal aorta, COPD with mild pulmonary hypertension, hypertension, hyperlipidemia, upper GI bleed, and arthritis who is being seen for the evaluation of new onset atrial fibrillation with RVR.   Assessment & Plan    New Onset Atrial Fibrillation with RVR: Continue metoprolol.  Continue anticoagulation.  14-day monitor to determine A-fib burden. Elevated Troponin: Assume rate related due to atrial fibrillation.  LV wall motion on echo was normal. CAD: Since reversion of atrial fibrillation to normal sinus rhythm, no chest discomfort.  Continue preventive and anti-ischemic therapy as prior to admission. PAD: Dr. Chestine Spore will set up lower extremity angiography in 1 to 2 weeks after she is over her current disease processes. Hypertension: Continue current therapy. Hyperlipidemia: Continue Repatha. AKI: Resolved.  Resume lisinopril HCTZ at 10/12.5 mg daily (one half the 20/25 mg tablet that she ordinarily takes at home). Diverticulitis: Per hospitalist service.  Appreciate the guidance.  Plan to advance diet. Dizziness and nausea: Suspect vertigo with associated nausea.  Start meclizine 12.5 mg twice daily.    Home later today if  no objections from [facility] hospitalist, nausea and vertigo have resolved, and she is able to tolerate diet.   For questions or updates, please contact [facility] Please consult [email or web address] for contact info under        Signed, Sinclair Grooms, MD  06/18/2022, 8:38 AM

## 2022-06-18 NOTE — Care Management (Signed)
  Transition of Care [facility]) Screening Note   Patient Details  Name: Sydney Gross Date of Birth: 1930/06/15   Transition of Care [facility]) CM/SW Contact:    Gala Lewandowsky, RN Phone Number: 06/18/2022, 1:27 PM    Transition of Care Department [facility]) has reviewed the patient and no TOC needs have been identified at this time. We will continue to monitor patient advancement through interdisciplinary progression rounds. If new patient transition needs arise, please place a TOC consult.

## 2022-06-18 NOTE — Progress Notes (Signed)
Mobility Specialist Progress Note    06/18/22 1536  Mobility  Activity Ambulated with assistance in hallway  Level of Assistance Contact guard assist, steadying assist  Assistive Device Front wheel walker  Distance Ambulated (ft) 250 ft  Activity Response Tolerated fair  $Mobility charge 1 Mobility   Pre-Mobility: 86 HR, 157/74 BP Post-Mobility: 81 HR  Pt received in bed and agreeable. C/o some dizziness w/ sitting up. Stated it felt better walking. Returned to chair with call bell in reach and NT present. Still c/o dizziness. Family present.   Raymond Nation Mobility Specialist

## 2022-06-21 DIAGNOSIS — I480 Paroxysmal atrial fibrillation: Secondary | ICD-10-CM

## 2022-06-24 ENCOUNTER — Telehealth: Payer: Self-pay | Admitting: Interventional Cardiology

## 2022-06-24 DIAGNOSIS — I480 Paroxysmal atrial fibrillation: Secondary | ICD-10-CM | POA: Diagnosis not present

## 2022-06-24 DIAGNOSIS — I5032 Chronic diastolic (congestive) heart failure: Secondary | ICD-10-CM | POA: Diagnosis not present

## 2022-06-24 DIAGNOSIS — K449 Diaphragmatic hernia without obstruction or gangrene: Secondary | ICD-10-CM | POA: Diagnosis not present

## 2022-06-24 DIAGNOSIS — E782 Mixed hyperlipidemia: Secondary | ICD-10-CM | POA: Diagnosis not present

## 2022-06-24 DIAGNOSIS — K5790 Diverticulosis of intestine, part unspecified, without perforation or abscess without bleeding: Secondary | ICD-10-CM | POA: Diagnosis not present

## 2022-06-24 DIAGNOSIS — K219 Gastro-esophageal reflux disease without esophagitis: Secondary | ICD-10-CM | POA: Diagnosis not present

## 2022-06-24 DIAGNOSIS — M810 Age-related osteoporosis without current pathological fracture: Secondary | ICD-10-CM | POA: Diagnosis not present

## 2022-06-24 DIAGNOSIS — I110 Hypertensive heart disease with heart failure: Secondary | ICD-10-CM | POA: Diagnosis not present

## 2022-06-24 DIAGNOSIS — D692 Other nonthrombocytopenic purpura: Secondary | ICD-10-CM | POA: Diagnosis not present

## 2022-06-24 DIAGNOSIS — I350 Nonrheumatic aortic (valve) stenosis: Secondary | ICD-10-CM | POA: Diagnosis not present

## 2022-06-24 DIAGNOSIS — J309 Allergic rhinitis, unspecified: Secondary | ICD-10-CM | POA: Diagnosis not present

## 2022-06-24 DIAGNOSIS — K117 Disturbances of salivary secretion: Secondary | ICD-10-CM | POA: Diagnosis not present

## 2022-06-24 DIAGNOSIS — J449 Chronic obstructive pulmonary disease, unspecified: Secondary | ICD-10-CM | POA: Diagnosis not present

## 2022-06-24 DIAGNOSIS — I739 Peripheral vascular disease, unspecified: Secondary | ICD-10-CM | POA: Diagnosis not present

## 2022-06-24 DIAGNOSIS — I2510 Atherosclerotic heart disease of native coronary artery without angina pectoris: Secondary | ICD-10-CM | POA: Diagnosis not present

## 2022-06-24 DIAGNOSIS — E201 Pseudohypoparathyroidism: Secondary | ICD-10-CM | POA: Diagnosis not present

## 2022-06-24 NOTE — Telephone Encounter (Signed)
  Sydney Gross - [facility] calling, she would like to confirm pt's diagnosis for heart failure. She said, if she unable to answer her phone to leave her a detailed message. She said,her voicemail is password protected

## 2022-06-28 ENCOUNTER — Other Ambulatory Visit: Payer: Self-pay

## 2022-06-28 DIAGNOSIS — I70219 Atherosclerosis of native arteries of extremities with intermittent claudication, unspecified extremity: Secondary | ICD-10-CM

## 2022-06-28 NOTE — Telephone Encounter (Signed)
Returned call to Verlee Monte with [facility] requesting verification of diagnosis of heart failure for coding due to [facility] regulations.  Confirmed with review of chart patient has a diagnosis of chronic diastolic heart failure noted in August 2019.  Eunice Blase thanked me for calling.

## 2022-06-29 ENCOUNTER — Telehealth: Payer: Self-pay

## 2022-06-29 NOTE — Telephone Encounter (Signed)
Received a call from patient requesting to cancel angiogram scheduled on 07/08/22. Stated she will call back to reschedule around Aug/Sept, once her back has healed.

## 2022-07-01 ENCOUNTER — Other Ambulatory Visit (HOSPITAL_BASED_OUTPATIENT_CLINIC_OR_DEPARTMENT_OTHER): Payer: Self-pay

## 2022-07-01 ENCOUNTER — Telehealth (HOSPITAL_BASED_OUTPATIENT_CLINIC_OR_DEPARTMENT_OTHER): Payer: Self-pay

## 2022-07-01 ENCOUNTER — Other Ambulatory Visit (HOSPITAL_COMMUNITY): Payer: Self-pay

## 2022-07-01 NOTE — Telephone Encounter (Signed)
Pharmacy Transitions of Care Follow-up Telephone Call  Date of discharge: 06/18/22  Discharge Diagnosis: Afib  How have you been since you were released from the hospital? Patient has been doing well. Denies chest pain or SOB, but has had some back pain since leaving the hospital which makes it hard to get around. Refills will be transferred to her [facility].    Medication changes made at discharge: START taking these medications  START taking these medications  Eliquis 2.5 MG Tabs tablet Generic drug: apixaban Take 1 tablet (2.5 mg total) by mouth 2 (two) times daily.  meclizine 12.5 MG tablet Commonly known as: ANTIVERT Take 1 tablet (12.5 mg total) by mouth 3 (three) times daily.   CHANGE how you take these medications  CHANGE how you take these medications  lisinopril-hydrochlorothiazide 20-25 MG tablet Commonly known as: ZESTORETIC Take 0.5 tablets by mouth in the morning. What changed: how much to take  metoprolol succinate 25 MG 24 hr tablet Commonly known as: TOPROL-XL Take 1 tablet (25 mg total) by mouth daily. Pt needs to make appt to see provider for further refills - 2nd attempt What changed: how much to take  Repatha SureClick 140 MG/ML Soaj Generic drug: Evolocumab INJECT 1 PEN INTO THE SKIN EVERY 14 DAYS What changed: See the new instructions.   CONTINUE taking these medications  CONTINUE taking these medications  acetaminophen 650 MG CR tablet Commonly known as: TYLENOL  Breztri Aerosphere 160-9-4.8 MCG/ACT Aero Generic drug: Budeson-Glycopyrrol-Formoterol  denosumab 60 MG/ML Sosy injection Commonly known as: PROLIA  diphenhydramine-acetaminophen 25-500 MG Tabs tablet Commonly known as: TYLENOL PM  docusate sodium 100 MG capsule Commonly known as: COLACE  ezetimibe 10 MG tablet Commonly known as: ZETIA Take 1 tablet by mouth once daily  nitroGLYCERIN 0.4 MG SL tablet Commonly known as: NITROSTAT  SLOW-MAG PO   STOP taking these medications  STOP  taking these medications  aspirin EC 81 MG tablet  cephALEXin 500 MG capsule Commonly known as: KEFLEX  predniSONE 10 MG tablet Commonly known as: DELTASONE    Medication changes verified by the patient? yes     Medication Accessibility:  Home Pharmacy: [facility] [phone]   Was the patient provided with refills on discharged medications? yes   Have all prescriptions been transferred from [facility] to home pharmacy? yes     Medication Review:   APIXABAN (ELIQUIS)  Apixaban 2.5 mg BID.  - Discussed importance of taking medication around the same time everyday  - Reviewed potential DDIs with patient  - Advised patient of medications to avoid (NSAIDs, ASA)  - Educated that Tylenol (acetaminophen) will be the preferred analgesic to prevent risk of bleeding  - Emphasized importance of monitoring for signs and symptoms of bleeding (abnormal bruising, prolonged bleeding, nose bleeds, bleeding from gums, discolored urine, black tarry stools)  - Advised patient to alert all providers of anticoagulation therapy prior to starting a new medication or having a procedure    Follow-up Appointments: Date Visit Type Length Department    07/05/2022  8:00 AM OFFICE VISIT 25 min [facility] Office [[phone]]    If their condition worsens, is the pt aware to call PCP or go to the Emergency Dept.? yes  Final Patient Assessment: Patient has been doing well. Denies chest pain or SOB, but has had some back pain since leaving the hospital which makes it hard to get around. Refills will be transferred to her [facility].    Jiles Crocker, PharmD Clinical Pharmacist [facility]  [facility] 07/01/2022 9:10 AM

## 2022-07-01 NOTE — Progress Notes (Deleted)
Cardiology Office Note    Date:  07/01/2022   ID:  Sydney Gross, DOB May 21, 1930, MRN [id number]  PCP:  Lupita Raider, MD  Cardiologist:  Lesleigh Noe, MD  Electrophysiologist:  None   Chief Complaint: ***  History of Present Illness:   Sydney Gross is a 88 y.o. female with history of ***  Labwork independently reviewed:    Cardiology Studies:   Studies reviewed are outlined and summarized above. Reports included below if pertinent.   ***    Past Medical History:  Diagnosis Date   Allergic rhinitis    Arthritis    "maybe a little in my knees and back" (07/17/2018)   Chronic lower back pain    Colitis 12/2013   "never had this before"   COPD (chronic obstructive pulmonary disease) (HCC)    "questionable/Dr. Katrinka Blazing" (07/17/2018)   Coronary artery disease    Family history of anesthesia complication    "mother; think they gave her too much; c/o pain after OR; gave her more RX; they had to bring her back real quick"   GI bleed    H/O calcium pyrophosphate deposition disease (CPPD)    Heart murmur    Hiatal hernia    Hyperlipidemia    Hypertension    Mild mitral regurgitation    Numbness in feet    On home oxygen therapy    "I've used it 2 times in 2 yrs" (07/17/2018)   PAD (peripheral artery disease) (HCC)    Proteinuria    Pulmonary hypertension (HCC)    Shingles    Stenosis of tear duct     Past Surgical History:  Procedure Laterality Date   ABDOMINAL AORTOGRAM W/LOWER EXTREMITY N/A 07/10/2021   Procedure: ABDOMINAL AORTOGRAM W/LOWER EXTREMITY;  Surgeon: Sherren Kerns, MD;  Location: [facility];  Service: Cardiovascular;  Laterality: N/A;   ABDOMINAL HYSTERECTOMY  1960's   APPENDECTOMY     BACK SURGERY     BIOPSY  04/20/2021   Procedure: BIOPSY;  Surgeon: Jeani Hawking, MD;  Location: [facility];  Service: Endoscopy;;   BLEPHAROPLASTY Bilateral 05/2017   CARDIAC CATHETERIZATION     CATARACT EXTRACTION, BILATERAL Bilateral     CESAREAN SECTION  1957; 1960   CORONARY STENT INTERVENTION N/A 07/17/2018   Procedure: CORONARY STENT INTERVENTION;  Surgeon: Lyn Records, MD;  Location: [facility];  Service: Cardiovascular;  Laterality: N/A;   DACROCYSTORHINOSTOMY W/ JONES TUBE Bilateral 05/2017   "put tear duct in my eyes"   ESOPHAGOGASTRODUODENOSCOPY (EGD) WITH PROPOFOL N/A 04/20/2021   Procedure: ESOPHAGOGASTRODUODENOSCOPY (EGD) WITH PROPOFOL;  Surgeon: Jeani Hawking, MD;  Location: [facility];  Service: Endoscopy;  Laterality: N/A;   EYE SURGERY     LAPAROSCOPIC CHOLECYSTECTOMY  ~ 2009   LUMBAR DISC SURGERY  ?2004   PERIPHERAL VASCULAR INTERVENTION Left 07/10/2021   Procedure: PERIPHERAL VASCULAR INTERVENTION;  Surgeon: Sherren Kerns, MD;  Location: [facility];  Service: Cardiovascular;  Laterality: Left;  common, external iliac arteries   RIGHT/LEFT HEART CATH AND CORONARY ANGIOGRAPHY N/A 07/17/2018   Procedure: RIGHT/LEFT HEART CATH AND CORONARY ANGIOGRAPHY;  Surgeon: Lyn Records, MD;  Location: [facility];  Service: Cardiovascular;  Laterality: N/A;   TONSILLECTOMY AND ADENOIDECTOMY  ?1940's    Current Medications: No outpatient medications have been marked as taking for the 07/05/22 encounter (Appointment) with Laurann Montana, PA-C.   ***   Allergies:   Brilinta [ticagrelor], Codeine, Darvon [propoxyphene], Lipitor [  atorvastatin], Rosuvastatin, Simvastatin, Ezetimibe, Nexlizet [bempedoic acid-ezetimibe], and Tramadol hcl   Social History   Socioeconomic History   Marital status: Married    Spouse name: Delton See   Number of children: Not on file   Years of education: 12   Highest education level: High school graduate  Occupational History   Occupation: Retired  Tobacco Use   Smoking status: Never   Smokeless tobacco: Never  Vaping Use   Vaping Use: Never used  Substance and Sexual Activity   Alcohol use: Yes    Alcohol/week: 4.0 standard drinks of alcohol    Types: 4  Glasses of wine per week    Comment: glass of wine every other night   Drug use: Never   Sexual activity: Not Currently  Other Topics Concern   Not on file  Social History Narrative   Not on file   Social Determinants of Health   Financial Resource Strain: Low Risk  (08/29/2018)   Overall Financial Resource Strain (CARDIA)    Difficulty of Paying Living Expenses: Not very hard  Food Insecurity: Not on file  Transportation Needs: No Transportation Needs (08/29/2018)   PRAPARE - Administrator, Civil Service (Medical): No    Lack of Transportation (Non-Medical): No  Physical Activity: Inactive (08/29/2018)   Exercise Vital Sign    Days of Exercise per Week: 0 days    Minutes of Exercise per Session: 0 min  Stress: No Stress Concern Present (08/29/2018)   [employer] of Occupational Health - Occupational Stress Questionnaire    Feeling of Stress : Not at all  Social Connections: Not on file     Family History:  The patient's ***family history includes CVA in her mother; Cancer in her brother; Emphysema (age of onset: 92) in her father.  ROS:   Please see the history of present illness. Otherwise, review of systems is positive for ***.  All other systems are reviewed and otherwise negative.    EKG(s)/Additional Labs   EKG:  EKG is ordered today, personally reviewed, demonstrating ***  Recent Labs: 08/14/2021: NT-Pro BNP 645 10/06/2021: ALT 14 06/17/2022: B Natriuretic Peptide 319.2 06/18/2022: BUN 9; Creatinine, Ser 0.93; Hemoglobin 10.6; Magnesium 2.5; Platelets 188; Potassium 4.2; Sodium 139; TSH 1.487  Recent Lipid Panel    Component Value Date/Time   CHOL 214 (H) 09/29/2021 0901   TRIG 170 (H) 10/05/2021 1807   HDL 38 (L) 09/29/2021 0901   CHOLHDL 5.6 (H) 09/29/2021 0901   LDLCALC 140 (H) 09/29/2021 0901    PHYSICAL EXAM:    VS:  There were no vitals taken for this visit.  BMI: There is no height or weight on file to calculate BMI.  GEN: Well  nourished, well developed female in no acute distress HEENT: normocephalic, atraumatic Neck: no JVD, carotid bruits, or masses Cardiac: ***RRR; no murmurs, rubs, or gallops, no edema  Respiratory:  clear to auscultation bilaterally, normal work of breathing GI: soft, nontender, nondistended, + BS MS: no deformity or atrophy Skin: warm and dry, no rash Neuro:  Alert and Oriented x 3, Strength and sensation are intact, follows commands Psych: euthymic mood, full affect  Wt Readings from Last 3 Encounters:  06/16/22 105 lb (47.6 kg)  05/25/22 110 lb (49.9 kg)  12/01/21 108 lb (49 kg)     ASSESSMENT & PLAN:   ***     Disposition: F/u with ***   Medication Adjustments/Labs and Tests Ordered: Current medicines are reviewed at length with the patient today.  Concerns regarding medicines are outlined above. Medication changes, Labs and Tests ordered today are summarized above and listed in the Patient Instructions accessible in Encounters.   Signed, Laurann Montana, PA-C  07/01/2022 1:56 PM    [facility] Phone: [phone]; Fax: [phone]

## 2022-07-02 ENCOUNTER — Encounter: Payer: Self-pay | Admitting: Physician Assistant

## 2022-07-02 NOTE — Progress Notes (Deleted)
Cardiology Office Note    Date:  07/02/2022   ID:  Sydney Gross, DOB [birth year], MRN [id number]  PCP:  Mayra Neer, MD  Cardiologist:  Sinclair Grooms, MD  Electrophysiologist:  None   Chief Complaint: ***  History of Present Illness:   Sydney Gross is a 88 y.o. female with history of CAD s/p DES to LAD and diagonal [id number], diastolic dysfunction, pulmonary HTN, PAF, COPD, PAD, ectatic abdominal aorta, HTN, HLD (prior LDL 363!), upper GIB, allergic rhinitis, mild pulm HTN, arthritis who presents for follow-up.    She was previously evaluated with abnormal stress test in 2019 leading to cardiac cath with results outlined below, ultimately PCI to LAD and diagonal. She was admitted 04/2021 with symptomatic anemia due to upper GIB and non-bleeding stomach ulcer and irritation of the duodenum. Plavix and NSAIDS were discontinued and she was treated with Protonix. However, she also has concomitant PAD followed by [facility] and underwent aortogram/LE intervention 07/2021. She was placed back on Plavix by [facility] to continue for 6 weeks. She had an episode of transient chest pain post-operatively and troponin was negative. EKG confounded by baseline wander. I saw her in 08/2021 at which time she reported feeling like she had a whole new lease on life without claudication, although with c/o increased dyspnea on exertion, with potential contribution from COPD on CXR and anemia. It was not clear whether this represented a new problem or simply deconditioning in the context of no longer being limited by claudication. She had not had any further CP. 2D echo 09/29/21 showed EF 60-65%, mild LVH, mildly elevated PASP (previously moderate), mild MR, no significant change from prior. When I saw her in follow-up 09/2021 she had increased SOB and hypoxia by our pulse Ox. She was sent to the ER and found to have Covid. Her O2 sat there was normal. She  was monitored overnight and able to be discharged without  significant complication. She followed up with Dr. Tamala Julian in 11/2021 and was felt to be overall stable.   More recently she was admitted 7/12-7/14/23 with new onset atrial fib with RVR. She was started on IV diltiazem and converted to NSR. Metoprolol was increased, magnesium repleted, started on anticoagulation. She was found to have elevated troponin felt due to demand ischemia, diverticulitis treated with abx, vertigo leading to nausea, and AKI. She was also due to have repeat angiogram 7/13 by the vascular team given drop in ABI but this was deferred given new onset atrial fibrillation and Dr. Carlis Abbott was notified. Monitor was arranged at discharge to review Afib burden. Repeat echo had shown EF 65-70%, moderate asymmetric focal basal septal hypertrophy, turbulence in the LV outflow tract but no significant LVOT gradient was measure, G2DD, moderately elevated PASP, mild LAE, mild MR, mild AI, normal IVC.  Pulm Recheck bmet, mg, cbc F/u vas surg pad  Paroxysmal atrial fibrillation  CAD s/p prior PCI Diastolic dysfunction, moderate pulmonary HTN, essential HTN Hyperlipidemia Mild AI, mild MR  Labwork independently reviewed: 06/2022 Mg 1.6-2.5, K 4.2, Cr 0.93, Hgb 10.6, plt 188, BNP 319 06/2022 scanned lab LDL 135  Cardiology Studies:   Studies reviewed are outlined and summarized above. Reports included below if pertinent.   2D echo 06/17/22    1. Left ventricular ejection fraction, by estimation, is 65 to 70%. The  left ventricle has normal function. The left ventricle has no regional  wall motion abnormalities. Moderate asymmetric focal basal septal left  ventricular hypertrophy. There was  turbulence in the LV outflow tract but no significant LVOT gradient was  measured. No mitral valve SAM noted. Left ventricular diastolic parameters  are consistent with Grade II diastolic dysfunction (pseudonormalization).   2. Right ventricular systolic function is normal. The right ventricular   size is normal. There is moderately elevated pulmonary artery systolic  pressure. The estimated right ventricular systolic pressure is 47.6 mmHg.   3. Left atrial size was mildly dilated.   4. The mitral valve is normal in structure. Mild mitral valve  regurgitation. No evidence of mitral stenosis.   5. The aortic valve is tricuspid. There is mild calcification of the  aortic valve. Aortic valve regurgitation is mild. No aortic stenosis is  present.   6. The inferior vena cava is normal in size with greater than 50%  respiratory variability, suggesting right atrial pressure of 3 mmHg.     Past Medical History:  Diagnosis Date   Allergic rhinitis    Arthritis    "maybe a little in my knees and back" (07/17/2018)   Chronic lower back pain    Colitis 12/2013   "never had this before"   COPD (chronic obstructive pulmonary disease) (HCC)    "questionable/Dr. Katrinka Blazing" (07/17/2018)   Coronary artery disease    Family history of anesthesia complication    "mother; think they gave her too much; c/o pain after OR; gave her more RX; they had to bring her back real quick"   GI bleed    H/O calcium pyrophosphate deposition disease (CPPD)    Heart murmur    Hiatal hernia    Hyperlipidemia    Hypertension    Mild mitral regurgitation    Numbness in feet    On home oxygen therapy    "I've used it 2 times in 2 yrs" (07/17/2018)   PAD (peripheral artery disease) (HCC)    Proteinuria    Pulmonary hypertension (HCC)    Shingles    Stenosis of tear duct     Past Surgical History:  Procedure Laterality Date   ABDOMINAL AORTOGRAM W/LOWER EXTREMITY N/A 07/10/2021   Procedure: ABDOMINAL AORTOGRAM W/LOWER EXTREMITY;  Surgeon: Sherren Kerns, MD;  Location: [facility];  Service: Cardiovascular;  Laterality: N/A;   ABDOMINAL HYSTERECTOMY  1960's   APPENDECTOMY     BACK SURGERY     BIOPSY  04/20/2021   Procedure: BIOPSY;  Surgeon: Jeani Hawking, MD;  Location: [facility];  Service:  Endoscopy;;   BLEPHAROPLASTY Bilateral 05/2017   CARDIAC CATHETERIZATION     CATARACT EXTRACTION, BILATERAL Bilateral    CESAREAN SECTION  1957; 1960   CORONARY STENT INTERVENTION N/A 07/17/2018   Procedure: CORONARY STENT INTERVENTION;  Surgeon: Lyn Records, MD;  Location: [facility];  Service: Cardiovascular;  Laterality: N/A;   DACROCYSTORHINOSTOMY W/ JONES TUBE Bilateral 05/2017   "put tear duct in my eyes"   ESOPHAGOGASTRODUODENOSCOPY (EGD) WITH PROPOFOL N/A 04/20/2021   Procedure: ESOPHAGOGASTRODUODENOSCOPY (EGD) WITH PROPOFOL;  Surgeon: Jeani Hawking, MD;  Location: [facility];  Service: Endoscopy;  Laterality: N/A;   EYE SURGERY     LAPAROSCOPIC CHOLECYSTECTOMY  ~ 2009   LUMBAR DISC SURGERY  ?2004   PERIPHERAL VASCULAR INTERVENTION Left 07/10/2021   Procedure: PERIPHERAL VASCULAR INTERVENTION;  Surgeon: Sherren Kerns, MD;  Location: [facility] INVAS[facility] CV [facility];  Service: Cardiovascular;  Laterality: Left;  common, external iliac arteries   RIGHT/LEFT HEART CATH AND CORONARY ANGIOGRAPHY N/A 07/17/2018   Procedure: RIGHT/LEFT HEART CATH AND CORONARY ANGIOGRAPHY;  Surgeon: Lyn Records, MD;  Location: [facility];  Service: Cardiovascular;  Laterality: N/A;   TONSILLECTOMY AND ADENOIDECTOMY  ?1940's    Current Medications: No outpatient medications have been marked as taking for the 07/05/22 encounter (Appointment) with Laurann Montana, PA-C.   ***   Allergies:   Brilinta [ticagrelor], Codeine, Darvon [propoxyphene], Lipitor [atorvastatin], Rosuvastatin, Simvastatin, Ezetimibe, Nexlizet [bempedoic acid-ezetimibe], and Tramadol hcl   Social History   Socioeconomic History   Marital status: Married    Spouse name: Delton See   Number of children: Not on file   Years of education: 12   Highest education level: High school graduate  Occupational History   Occupation: Retired  Tobacco Use   Smoking status: Never   Smokeless tobacco: Never  Vaping Use   Vaping Use: Never  used  Substance and Sexual Activity   Alcohol use: Yes    Alcohol/week: 4.0 standard drinks of alcohol    Types: 4 Glasses of wine per week    Comment: glass of wine every other night   Drug use: Never   Sexual activity: Not Currently  Other Topics Concern   Not on file  Social History Narrative   Not on file   Social Determinants of Health   Financial Resource Strain: Low Risk  (08/29/2018)   Overall Financial Resource Strain (CARDIA)    Difficulty of Paying Living Expenses: Not very hard  Food Insecurity: Not on file  Transportation Needs: No Transportation Needs (08/29/2018)   PRAPARE - Administrator, Civil Service (Medical): No    Lack of Transportation (Non-Medical): No  Physical Activity: Inactive (08/29/2018)   Exercise Vital Sign    Days of Exercise per Week: 0 days    Minutes of Exercise per Session: 0 min  Stress: No Stress Concern Present (08/29/2018)   [employer] of Occupational Health - Occupational Stress Questionnaire    Feeling of Stress : Not at all  Social Connections: Not on file     Family History:  The patient's ***family history includes CVA in her mother; Cancer in her brother; Emphysema (age of onset: 77) in her father.  ROS:   Please see the history of present illness. Otherwise, review of systems is positive for ***.  All other systems are reviewed and otherwise negative.    EKG(s)/Additional Labs   EKG:  EKG is ordered today, personally reviewed, demonstrating ***  Recent Labs: 08/14/2021: NT-Pro BNP 645 10/06/2021: ALT 14 06/17/2022: B Natriuretic Peptide 319.2 06/18/2022: BUN 9; Creatinine, Ser 0.93; Hemoglobin 10.6; Magnesium 2.5; Platelets 188; Potassium 4.2; Sodium 139; TSH 1.487  Recent Lipid Panel    Component Value Date/Time   CHOL 214 (H) 09/29/2021 0901   TRIG 170 (H) 10/05/2021 1807   HDL 38 (L) 09/29/2021 0901   CHOLHDL 5.6 (H) 09/29/2021 0901   LDLCALC 140 (H) 09/29/2021 0901    PHYSICAL EXAM:    VS:   There were no vitals taken for this visit.  BMI: There is no height or weight on file to calculate BMI.  GEN: Well nourished, well developed female in no acute distress HEENT: normocephalic, atraumatic Neck: no JVD, carotid bruits, or masses Cardiac: ***RRR; no murmurs, rubs, or gallops, no edema  Respiratory:  clear to auscultation bilaterally, normal work of breathing GI: soft, nontender, nondistended, + BS MS: no deformity or atrophy Skin: warm and dry, no rash Neuro:  Alert and Oriented x 3, Strength and sensation are intact, follows commands Psych: euthymic mood, full  affect  Wt Readings from Last 3 Encounters:  06/16/22 105 lb (47.6 kg)  05/25/22 110 lb (49.9 kg)  12/01/21 108 lb (49 kg)     ASSESSMENT & PLAN:   ***     Disposition: F/u with ***   Medication Adjustments/Labs and Tests Ordered: Current medicines are reviewed at length with the patient today.  Concerns regarding medicines are outlined above. Medication changes, Labs and Tests ordered today are summarized above and listed in the Patient Instructions accessible in Encounters.   Signed, Charlie Pitter, PA-C  07/02/2022 4:45 PM    [place] Phone: [phone]; Fax: [phone]

## 2022-07-05 ENCOUNTER — Ambulatory Visit: Payer: Medicare Other | Admitting: Physician Assistant

## 2022-07-05 DIAGNOSIS — I519 Heart disease, unspecified: Secondary | ICD-10-CM

## 2022-07-05 DIAGNOSIS — I2510 Atherosclerotic heart disease of native coronary artery without angina pectoris: Secondary | ICD-10-CM

## 2022-07-05 DIAGNOSIS — I351 Nonrheumatic aortic (valve) insufficiency: Secondary | ICD-10-CM

## 2022-07-05 DIAGNOSIS — E785 Hyperlipidemia, unspecified: Secondary | ICD-10-CM

## 2022-07-05 DIAGNOSIS — I272 Pulmonary hypertension, unspecified: Secondary | ICD-10-CM

## 2022-07-05 DIAGNOSIS — I480 Paroxysmal atrial fibrillation: Secondary | ICD-10-CM

## 2022-07-05 DIAGNOSIS — I340 Nonrheumatic mitral (valve) insufficiency: Secondary | ICD-10-CM

## 2022-07-05 DIAGNOSIS — I10 Essential (primary) hypertension: Secondary | ICD-10-CM

## 2022-07-08 ENCOUNTER — Encounter (HOSPITAL_COMMUNITY): Payer: Self-pay

## 2022-07-08 ENCOUNTER — Ambulatory Visit (HOSPITAL_COMMUNITY): Admit: 2022-07-08 | Payer: Medicare Other | Admitting: Vascular Surgery

## 2022-07-08 DIAGNOSIS — M549 Dorsalgia, unspecified: Secondary | ICD-10-CM | POA: Diagnosis not present

## 2022-07-08 DIAGNOSIS — R197 Diarrhea, unspecified: Secondary | ICD-10-CM | POA: Diagnosis not present

## 2022-07-08 SURGERY — ABDOMINAL AORTOGRAM W/LOWER EXTREMITY
Anesthesia: LOCAL

## 2022-07-13 DIAGNOSIS — I480 Paroxysmal atrial fibrillation: Secondary | ICD-10-CM | POA: Diagnosis not present

## 2022-07-19 ENCOUNTER — Other Ambulatory Visit: Payer: Self-pay | Admitting: Interventional Cardiology

## 2022-07-19 DIAGNOSIS — I25118 Atherosclerotic heart disease of native coronary artery with other forms of angina pectoris: Secondary | ICD-10-CM

## 2022-07-19 DIAGNOSIS — I70219 Atherosclerosis of native arteries of extremities with intermittent claudication, unspecified extremity: Secondary | ICD-10-CM

## 2022-07-19 DIAGNOSIS — E785 Hyperlipidemia, unspecified: Secondary | ICD-10-CM

## 2022-08-02 NOTE — Progress Notes (Deleted)
Cardiology Office Note    Date:  08/02/2022   ID:  Sydney Gross, DOB 1930-06-16, MRN [id number]  PCP:  Mayra Neer, MD  Cardiologist:  Sinclair Grooms, MD  Electrophysiologist:  None   Chief Complaint: ***  History of Present Illness:   Sydney Gross is a 88 y.o. female with history of CAD s/p DES to LAD and diagonal 8832, diastolic dysfunction, pulmonary HTN, PAF/flutter, COPD, PAD, ectatic abdominal aorta, HTN, HLD (prior LDL 363!), upper GIB, allergic rhinitis, mild pulm HTN, arthritis who presents for follow-up.     She was previously evaluated with abnormal stress test in 2019 leading to cardiac cath with results outlined below, ultimately PCI to LAD and diagonal. She was admitted 04/2021 with symptomatic anemia due to upper GIB and non-bleeding stomach ulcer and irritation of the duodenum. Plavix and NSAIDS were discontinued and she was treated with Protonix. However, she also has concomitant PAD followed by [facility] and underwent aortogram/LE intervention 07/2021. She was placed back on Plavix by [facility] to continue for 6 weeks. She had an episode of transient chest pain post-operatively and troponin was negative. EKG confounded by baseline wander. I saw her in 08/2021 at which time she reported feeling like she had a whole new lease on life without claudication, although with c/o increased dyspnea on exertion, with potential contribution from COPD on CXR and anemia. It was not clear whether this represented a new problem or simply deconditioning in the context of no longer being limited by claudication. She had not had any further CP. 2D echo 09/29/21 showed EF 60-65%, mild LVH, mildly elevated PASP (previously moderate), mild MR, no significant change from prior. When I saw her in follow-up 09/2021 she had increased SOB and hypoxia by our pulse Ox. She was sent to the ER and found to have Covid. Her O2 sat there was normal. She  was monitored overnight and able to be discharged without  significant complication. She followed up with Dr. Tamala Julian in 11/2021 and was felt to be overall stable.   More recently she was admitted 7/12-7/14/23 with new onset atrial fib with RVR. She was started on IV diltiazem and converted to NSR. Metoprolol was increased, magnesium repleted, started on anticoagulation. She was found to have elevated troponin felt due to demand ischemia, diverticulitis treated with abx, vertigo leading to nausea, and AKI. She was also due to have repeat angiogram 7/13 by the vascular team given drop in ABI but this was deferred given new onset atrial fibrillation and Dr. Carlis Abbott was notified. Repeat echo had shown EF 65-70%, moderate asymmetric focal basal septal hypertrophy, turbulence in the LV outflow tract but no significant LVOT gradient was measure, G2DD, moderately elevated PASP, mild LAE, mild MR, mild AI, normal IVC. Monitor was arranged at discharge to review Afib burden, which showed NSR with 7% atrial flutter (longest episode >20 hours), <1% PVC burden, nocturnal bradycardia in the high 40s, average HR 78bpm.  Pulm  Recheck bmet, mg, cbc  F/u vas surg pad   Paroxysmal atrial fibrillation/flutter CAD s/p prior PCI, HLD Moderate pulmonary HTN, diastolic dysfunction, essential HTN  Mild AI, mild MR    Labwork independently reviewed: 06/2022 Mg 1.6-2.5, K 4.2, Cr 0.93, Hgb 10.6, plt 188, BNP 319  06/2022 scanned lab LDL 135    Cardiology Studies:   Studies reviewed are outlined and summarized above. Reports included below if pertinent.   2D echo 06/17/22    1. Left ventricular ejection fraction, by estimation,  is 65 to 70%. The  left ventricle has normal function. The left ventricle has no regional  wall motion abnormalities. Moderate asymmetric focal basal septal left  ventricular hypertrophy. There was  turbulence in the LV outflow tract but no significant LVOT gradient was  measured. No mitral valve SAM noted. Left ventricular diastolic parameters  are  consistent with Grade II diastolic dysfunction (pseudonormalization).   2. Right ventricular systolic function is normal. The right ventricular  size is normal. There is moderately elevated pulmonary artery systolic  pressure. The estimated right ventricular systolic pressure is 97.7 mmHg.   3. Left atrial size was mildly dilated.   4. The mitral valve is normal in structure. Mild mitral valve  regurgitation. No evidence of mitral stenosis.   5. The aortic valve is tricuspid. There is mild calcification of the  aortic valve. Aortic valve regurgitation is mild. No aortic stenosis is  present.   6. The inferior vena cava is normal in size with greater than 50%  respiratory variability, suggesting right atrial pressure of 3 mmHg.     Cath 07/2018 Coronary artery disease with high-grade obstruction in the mid LAD and proximal to mid first diagonal. Normal left main. Normal circumflex coronary artery. Dominant RCA with luminal irregularities up to 40% distally. Right heart pressures Ventricular systolic function and LVEDP (14 mmHg). Successful PCI and stent implantation in both the mid LAD and proximal to mid first diagonal.   The LAD 90% stenosis was reduced to 0% with TIMI grade III flow using a 2.5 x 28 Synergy postdilated to 2.75 mm in diameter.  TIMI grade III flow pre-and post stenting. The first diagonal 90% stenosis was reduced to 0% using by 2.25 mm Synergy deployed at 14 atm which is not effective stent size of 2.32 mm.  TIMI grade III flow noted pre-and post PCI.   RECOMMENDATIONS:   Continue aggressive risk factor modification: PCSK9 therapy (Repatha), moderate aerobic physical activity, blood pressure control less than 140/90 mmHg, and surveillance of glycemic control. Aspirin and Brilinta x1 month.  Switch to aspirin and Plavix thereafter.  Total duration of dual antiplatelet therapy 6 months at which time Plavix can be dropped. Discharge in a.m.   Recommend uninterrupted dual  antiplatelet therapy with Aspirin 109m daily and Ticagrelor 928mtwice daily for a minimum of 6 months (stable ischemic heart disease - Class I recommendation).      Past Medical History:  Diagnosis Date   Allergic rhinitis    Arthritis    "maybe a little in my knees and back" (07/17/2018)   Chronic lower back pain    Colitis 12/2013   "never had this before"   COPD [place] disease) [place]   "questionable/Dr. SmTamala Julian(07/17/2018)   Coronary artery disease    Family history of anesthesia complication    "mother; think they gave her too much; c/o pain after OR; gave her more RX; they had to bring her back real quick"   GI bleed    H/O calcium pyrophosphate deposition disease (CPPD)    Heart murmur    Hiatal hernia    Hyperlipidemia    Hypertension    Mild mitral regurgitation    Numbness in feet    On home oxygen therapy    "I've used it 2 times in 2 yrs" (07/17/2018)   PAD (peripheral artery disease) (H[facility])    PAF (paroxysmal atrial fibrillation) (H[facility])    Proteinuria    Pulmonary hypertension [place]   Shingles  Stenosis of tear duct     Past Surgical History:  Procedure Laterality Date   ABDOMINAL AORTOGRAM W/LOWER EXTREMITY N/A 07/10/2021   Procedure: ABDOMINAL AORTOGRAM W/LOWER EXTREMITY;  Surgeon: Elam Dutch, MD;  Location: [facility];  Service: Cardiovascular;  Laterality: N/A;   ABDOMINAL HYSTERECTOMY  1960's   APPENDECTOMY     BACK SURGERY     BIOPSY  04/20/2021   Procedure: BIOPSY;  Surgeon: Carol Ada, MD;  Location: [facility];  Service: Endoscopy;;   BLEPHAROPLASTY Bilateral 05/2017   CARDIAC CATHETERIZATION     CATARACT EXTRACTION, BILATERAL Bilateral    CESAREAN SECTION  1957; 1960   CORONARY STENT INTERVENTION N/A 07/17/2018   Procedure: CORONARY STENT INTERVENTION;  Surgeon: Belva Crome, MD;  Location: [facility];  Service: Cardiovascular;  Laterality: N/A;   DACROCYSTORHINOSTOMY W/ JONES TUBE Bilateral  05/2017   "put tear duct in my eyes"   ESOPHAGOGASTRODUODENOSCOPY (EGD) WITH PROPOFOL N/A 04/20/2021   Procedure: ESOPHAGOGASTRODUODENOSCOPY (EGD) WITH PROPOFOL;  Surgeon: Carol Ada, MD;  Location: [facility];  Service: Endoscopy;  Laterality: N/A;   EYE SURGERY     LAPAROSCOPIC CHOLECYSTECTOMY  ~ 2009   LUMBAR DISC SURGERY  ?2004   PERIPHERAL VASCULAR INTERVENTION Left 07/10/2021   Procedure: PERIPHERAL VASCULAR INTERVENTION;  Surgeon: Elam Dutch, MD;  Location: [facility];  Service: Cardiovascular;  Laterality: Left;  common, external iliac arteries   RIGHT/LEFT HEART CATH AND CORONARY ANGIOGRAPHY N/A 07/17/2018   Procedure: RIGHT/LEFT HEART CATH AND CORONARY ANGIOGRAPHY;  Surgeon: Belva Crome, MD;  Location: [facility];  Service: Cardiovascular;  Laterality: N/A;   TONSILLECTOMY AND ADENOIDECTOMY  ?1940's    Current Medications: No outpatient medications have been marked as taking for the 08/03/22 encounter (Appointment) with Charlie Pitter, PA-C.   ***   Allergies:   Brilinta [ticagrelor], Codeine, Darvon [propoxyphene], Lipitor [atorvastatin], Rosuvastatin, Simvastatin, Ezetimibe, Nexlizet [bempedoic acid-ezetimibe], and Tramadol hcl   Social History   Socioeconomic History   Marital status: Married    Spouse name: Meda Coffee   Number of children: Not on file   Years of education: 12   Highest education level: High school graduate  Occupational History   Occupation: Retired  Tobacco Use   Smoking status: Never   Smokeless tobacco: Never  Vaping Use   Vaping Use: Never used  Substance and Sexual Activity   Alcohol use: Yes    Alcohol/week: 4.0 standard drinks of alcohol    Types: 4 Glasses of wine per week    Comment: glass of wine every other night   Drug use: Never   Sexual activity: Not Currently  Other Topics Concern   Not on file  Social History Narrative   Not on file   Social Determinants of Health   Financial Resource Strain: Low  Risk  (08/29/2018)   Overall Financial Resource Strain (CARDIA)    Difficulty of Paying Living Expenses: Not very hard  Food Insecurity: Not on file  Transportation Needs: No Transportation Needs (08/29/2018)   PRAPARE - Hydrologist (Medical): No    Lack of Transportation (Non-Medical): No  Physical Activity: Inactive (08/29/2018)   Exercise Vital Sign    Days of Exercise per Week: 0 days    Minutes of Exercise per Session: 0 min  Stress: No Stress Concern Present (08/29/2018)   [place]    Feeling of Stress : Not at all  Social Connections: Not on file     Family History:  The patient's ***family history includes CVA in her mother; Cancer in her brother; Emphysema (age of onset: 46) in her father.  ROS:   Please see the history of present illness. Otherwise, review of systems is positive for ***.  All other systems are reviewed and otherwise negative.    EKG(s)/Additional Labs   EKG:  EKG is ordered today, personally reviewed, demonstrating ***  Recent Labs: 08/14/2021: NT-Pro BNP 645 10/06/2021: ALT 14 06/17/2022: B Natriuretic Peptide 319.2 06/18/2022: BUN 9; Creatinine, Ser 0.93; Hemoglobin 10.6; Magnesium 2.5; Platelets 188; Potassium 4.2; Sodium 139; TSH 1.487  Recent Lipid Panel    Component Value Date/Time   CHOL 214 (H) 09/29/2021 0901   TRIG 170 (H) 10/05/2021 1807   HDL 38 (L) 09/29/2021 0901   CHOLHDL 5.6 (H) 09/29/2021 0901   LDLCALC 140 (H) 09/29/2021 0901    PHYSICAL EXAM:    VS:  There were no vitals taken for this visit.  BMI: There is no height or weight on file to calculate BMI.  GEN: Well nourished, well developed female in no acute distress HEENT: normocephalic, atraumatic Neck: no JVD, carotid bruits, or masses Cardiac: ***RRR; no murmurs, rubs, or gallops, no edema  Respiratory:  clear to auscultation bilaterally, normal work of breathing GI: soft,  nontender, nondistended, + BS MS: no deformity or atrophy Skin: warm and dry, no rash Neuro:  Alert and Oriented x 3, Strength and sensation are intact, follows commands Psych: euthymic mood, full affect  Wt Readings from Last 3 Encounters:  06/16/22 105 lb (47.6 kg)  05/25/22 110 lb (49.9 kg)  12/01/21 108 lb (49 kg)     ASSESSMENT & PLAN:   ***     Disposition: F/u with ***   Medication Adjustments/Labs and Tests Ordered: Current medicines are reviewed at length with the patient today.  Concerns regarding medicines are outlined above. Medication changes, Labs and Tests ordered today are summarized above and listed in the Patient Instructions accessible in Encounters.   Signed, Charlie Pitter, PA-C  08/02/2022 12:43 PM    [place] Phone: [phone]; Fax: [phone]

## 2022-08-03 ENCOUNTER — Ambulatory Visit: Payer: Medicare Other | Admitting: Physician Assistant

## 2022-08-03 DIAGNOSIS — I10 Essential (primary) hypertension: Secondary | ICD-10-CM

## 2022-08-03 DIAGNOSIS — I480 Paroxysmal atrial fibrillation: Secondary | ICD-10-CM

## 2022-08-03 DIAGNOSIS — E7849 Other hyperlipidemia: Secondary | ICD-10-CM

## 2022-08-03 DIAGNOSIS — I4892 Unspecified atrial flutter: Secondary | ICD-10-CM

## 2022-08-03 DIAGNOSIS — I2510 Atherosclerotic heart disease of native coronary artery without angina pectoris: Secondary | ICD-10-CM

## 2022-08-03 DIAGNOSIS — I351 Nonrheumatic aortic (valve) insufficiency: Secondary | ICD-10-CM

## 2022-08-03 DIAGNOSIS — I340 Nonrheumatic mitral (valve) insufficiency: Secondary | ICD-10-CM

## 2022-08-03 DIAGNOSIS — I272 Pulmonary hypertension, unspecified: Secondary | ICD-10-CM

## 2022-08-04 NOTE — Progress Notes (Unsigned)
Cardiology Office Note:    Date:  08/05/2022   ID:  Sydney Gross, DOB Feb 07, 1930, MRN [id number]  PCP:  Mayra Neer, MD   [facility] Providers Cardiologist:  Sinclair Grooms, MD     Referring MD: Mayra Neer, MD   Chief Complaint: follow-up atrial flutter  History of Present Illness:    Sydney Gross is a very pleasant 88 y.o. female with a hx of CAD with prior stents, HLD, HTN, COPD, PAD with claudication and prior stents, chronic DOE, PAF.   07/10/21 she underwent left common iliac stenting x2 and left external iliac stenting for left leg claudication with left iliac occlusion.  Has known flush SFA occlusion bilaterally as well. Most recent cardiac catheterization revealed high-grade obstruction in mid LAD and proximal to mid first diagonal with successful PCI/stent to both the mid LAD and proximal to mid first diagonal. Plan to continue DAPT for 6 months after which Plavix could be discontinued.   She was last seen in our office on 11/13/2021 by Dr. Tamala Julian at which time he documented discussion of history of COPD.  She never smoked nor had environmental exposure and recent CXR showed no evidence of COPD.  Admission 7/12-7/14/23 with PAF which spontaneously converted.  Because of her age and high CHA2DS2-VASc she was started on Eliquis.  Subsequently and probably unrelated she developed abdominal bloating and was diagnosed with diverticulosis.  Antibiotics were started and her symptoms improved.  This was guided by general surgery hospitalists.  She also had nausea and vertigo which improved with Antivert. Cardiac monitor was ordered at hospital discharge which revealed basic underlying rhythm NSR with average HR 78 bpm, atrial flutter burden 7% with HR ranging from 10 6-1 47.  Longest episode greater than 20 hours. PVC burden less than 1%. Nocturnal bradycardia in the high 40s on occasion.  Today, she is here with her daughter for follow-up. Reports she is feeling  well but had severe back spasms after hospitalization that she thinks were 2/2 being less mobile than normal. Worked with PT for 6 weeks and is feeling back to normal over the past few days.  Thinks she is still taking aspirin. She denies chest pain, shortness of breath, lower extremity edema, fatigue, palpitations, melena, hematuria, hemoptysis, diaphoresis, weakness, presyncope, syncope, orthopnea, and PND. Was planning for vascular surgery at the time of onset of a fib. Felt onset of a fib after drinking a frothy beverage.   Past Medical History:  Diagnosis Date   Allergic rhinitis    Arthritis    "maybe a little in my knees and back" (07/17/2018)   Chronic lower back pain    Colitis 12/2013   "never had this before"   COPD (chronic obstructive pulmonary disease) ([place])    "questionable/Dr. Tamala Julian" (07/17/2018)   Coronary artery disease    Family history of anesthesia complication    "mother; think they gave her too much; c/o pain after OR; gave her more RX; they had to bring her back real quick"   GI bleed    H/O calcium pyrophosphate deposition disease (CPPD)    Heart murmur    Hiatal hernia    Hyperlipidemia    Hypertension    Mild mitral regurgitation    Numbness in feet    On home oxygen therapy    "I've used it 2 times in 2 yrs" (07/17/2018)   PAD (peripheral artery disease) (HCC)    PAF (paroxysmal atrial fibrillation) (HCC)    Proteinuria  Pulmonary hypertension (HCC)    Shingles    Stenosis of tear duct     Past Surgical History:  Procedure Laterality Date   ABDOMINAL AORTOGRAM W/LOWER EXTREMITY N/A 07/10/2021   Procedure: ABDOMINAL AORTOGRAM W/LOWER EXTREMITY;  Surgeon: Elam Dutch, MD;  Location: [place] [facility];  Service: Cardiovascular;  Laterality: N/A;   ABDOMINAL HYSTERECTOMY  1960's   APPENDECTOMY     BACK SURGERY     BIOPSY  04/20/2021   Procedure: BIOPSY;  Surgeon: Carol Ada, MD;  Location: [facility];  Service: Endoscopy;;   BLEPHAROPLASTY  Bilateral 05/2017   CARDIAC CATHETERIZATION     CATARACT EXTRACTION, BILATERAL Bilateral    CESAREAN SECTION  1957; 1960   CORONARY STENT INTERVENTION N/A 07/17/2018   Procedure: CORONARY STENT INTERVENTION;  Surgeon: Belva Crome, MD;  Location: [place] [facility];  Service: Cardiovascular;  Laterality: N/A;   DACROCYSTORHINOSTOMY W/ JONES TUBE Bilateral 05/2017   "put tear duct in my eyes"   ESOPHAGOGASTRODUODENOSCOPY (EGD) WITH PROPOFOL N/A 04/20/2021   Procedure: ESOPHAGOGASTRODUODENOSCOPY (EGD) WITH PROPOFOL;  Surgeon: Carol Ada, MD;  Location: [facility];  Service: Endoscopy;  Laterality: N/A;   EYE SURGERY     LAPAROSCOPIC CHOLECYSTECTOMY  ~ 2009   LUMBAR DISC SURGERY  ?2004   PERIPHERAL VASCULAR INTERVENTION Left 07/10/2021   Procedure: PERIPHERAL VASCULAR INTERVENTION;  Surgeon: Elam Dutch, MD;  Location: [place] [facility];  Service: Cardiovascular;  Laterality: Left;  common, external iliac arteries   RIGHT/LEFT HEART CATH AND CORONARY ANGIOGRAPHY N/A 07/17/2018   Procedure: RIGHT/LEFT HEART CATH AND CORONARY ANGIOGRAPHY;  Surgeon: Belva Crome, MD;  Location: [place] [facility];  Service: Cardiovascular;  Laterality: N/A;   TONSILLECTOMY AND ADENOIDECTOMY  ?1940's    Current Medications: Current Meds  Medication Sig   acetaminophen (TYLENOL) 650 MG CR tablet Take 1,300 mg by mouth as needed for pain.   apixaban (ELIQUIS) 2.5 MG TABS tablet Take 1 tablet (2.5 mg total) by mouth 2 (two) times daily.   Budeson-Glycopyrrol-Formoterol (BREZTRI AEROSPHERE) 160-9-4.8 MCG/ACT AERO Inhale 2 puffs into the lungs 2 (two) times daily as needed (Shortness of breath/ COPD).   denosumab (PROLIA) 60 MG/ML SOSY injection Inject 60 mg as directed every 6 (six) months.   diphenhydramine-acetaminophen (TYLENOL PM) 25-500 MG TABS tablet Take 1 tablet by mouth at bedtime.   Evolocumab (REPATHA SURECLICK) 876 MG/ML SOAJ INJECT 1 PEN INTO THE SKIN EVERY 14 DAYS   ezetimibe (ZETIA) 10 MG  tablet Take 1 tablet by mouth once daily   lisinopril-hydrochlorothiazide (ZESTORETIC) 20-25 MG tablet Take 0.5 tablets by mouth in the morning.   Magnesium Cl-Calcium Carbonate (SLOW-MAG PO) Take 1 tablet by mouth 2 (two) times daily.   meclizine (ANTIVERT) 12.5 MG tablet Take 1 tablet (12.5 mg total) by mouth 3 (three) times daily.   metoprolol succinate (TOPROL-XL) 25 MG 24 hr tablet Take 1 tablet (25 mg total) by mouth daily. Pt needs to make appt to see provider for further refills - 2nd attempt   metoprolol tartrate (LOPRESSOR) 25 MG tablet Take one half - one tablet (12.5 mg - 25 mg) as needed for Heart Rate 100 or above.   nitroGLYCERIN (NITROSTAT) 0.4 MG SL tablet Place 0.4 mg under the tongue every 5 (five) minutes as needed for chest pain.   predniSONE (DELTASONE) 10 MG tablet Take 1 tablet by mouth daily in the afternoon.     Allergies:   Brilinta [ticagrelor], Codeine, Darvon [propoxyphene], Lipitor [atorvastatin], Rosuvastatin, Simvastatin,  Ezetimibe, Nexlizet [bempedoic acid-ezetimibe], and Tramadol hcl   Social History   Socioeconomic History   Marital status: Married    Spouse name: Meda Coffee   Number of children: Not on file   Years of education: 12   Highest education level: High school graduate  Occupational History   Occupation: Retired  Tobacco Use   Smoking status: Never   Smokeless tobacco: Never  Vaping Use   Vaping Use: Never used  Substance and Sexual Activity   Alcohol use: Yes    Alcohol/week: 4.0 standard drinks of alcohol    Types: 4 Glasses of wine per week    Comment: glass of wine every other night   Drug use: Never   Sexual activity: Not Currently  Other Topics Concern   Not on file  Social History Narrative   Not on file   Social Determinants of Health   Financial Resource Strain: Low Risk  (08/29/2018)   Overall Financial Resource Strain (CARDIA)    Difficulty of Paying Living Expenses: Not very hard  Food Insecurity: Not on file   Transportation Needs: No Transportation Needs (08/29/2018)   PRAPARE - Hydrologist (Medical): No    Lack of Transportation (Non-Medical): No  Physical Activity: Inactive (08/29/2018)   Exercise Vital Sign    Days of Exercise per Week: 0 days    Minutes of Exercise per Session: 0 min  Stress: No Stress Concern Present (08/29/2018)   [place]    Feeling of Stress : Not at all  Social Connections: Not on file     Family History: The patient's family history includes CVA in her mother; Cancer in her brother; Emphysema (age of onset: 34) in her father.  ROS:   Please see the history of present illness.  All other systems reviewed and are negative.  Labs/Other Studies Reviewed:    The following studies were reviewed today:  Cardiac monitor 07/23/22    Basic underlying rhythm is normal sinus rhythm with average heart rate 78 bpm   Atrial flutter burden 7% with heart rate ranging between 106 and 147 bpm, average 121 bpm.  Longest episode greater than 20 hours.   PVC burden less than 1%   Nocturnal bradycardia in the high 40s on occasion.  Echo 06/17/22  1. Left ventricular ejection fraction, by estimation, is 65 to 70%. The  left ventricle has normal function. The left ventricle has no regional  wall motion abnormalities. Moderate asymmetric focal basal septal left  ventricular hypertrophy. There was  turbulence in the LV outflow tract but no significant LVOT gradient was  measured. No mitral valve SAM noted. Left ventricular diastolic parameters  are consistent with Grade II diastolic dysfunction (pseudonormalization).   2. Right ventricular systolic function is normal. The right ventricular  size is normal. There is moderately elevated pulmonary artery systolic  pressure. The estimated right ventricular systolic pressure is 25.3 mmHg.   3. Left atrial size was mildly dilated.   4. The  mitral valve is normal in structure. Mild mitral valve  regurgitation. No evidence of mitral stenosis.   5. The aortic valve is tricuspid. There is mild calcification of the  aortic valve. Aortic valve regurgitation is mild. No aortic stenosis is  present.   6. The inferior vena cava is normal in size with greater than 50%  respiratory variability, suggesting right atrial pressure of 3 mmHg.   [facility] 07/17/18  Coronary artery disease with  high-grade obstruction in the mid LAD and proximal to mid first diagonal. Normal left main. Normal circumflex coronary artery. Dominant RCA with luminal irregularities up to 40% distally. Right heart pressures Ventricular systolic function and LVEDP (14 mmHg). Successful PCI and stent implantation in both the mid LAD and proximal to mid first diagonal.   The LAD 90% stenosis was reduced to 0% with TIMI grade III flow using a 2.5 x 28 Synergy postdilated to 2.75 mm in diameter.  TIMI grade III flow pre-and post stenting. The first diagonal 90% stenosis was reduced to 0% using by 2.25 mm Synergy deployed at 14 atm which is not effective stent size of 2.32 mm.  TIMI grade III flow noted pre-and post PCI.   RECOMMENDATIONS:   Continue aggressive risk factor modification: PCSK9 therapy (Repatha), moderate aerobic physical activity, blood pressure control less than 140/90 mmHg, and surveillance of glycemic control. Aspirin and Brilinta x1 month.  Switch to aspirin and Plavix thereafter.  Total duration of dual antiplatelet therapy 6 months at which time Plavix can be dropped. Discharge in a.m.   Recommend uninterrupted dual antiplatelet therapy with Aspirin 36m daily and Ticagrelor 915mtwice daily for a minimum of 6 months (stable ischemic heart disease - Class I recommendation).  Recent Labs: 08/14/2021: NT-Pro BNP 645 10/06/2021: ALT 14 06/17/2022: B Natriuretic Peptide 319.2 06/18/2022: BUN 9; Creatinine, Ser 0.93; Hemoglobin 10.6; Magnesium 2.5; Platelets  188; Potassium 4.2; Sodium 139; TSH 1.487  Recent Lipid Panel    Component Value Date/Time   CHOL 214 (H) 09/29/2021 0901   TRIG 170 (H) 10/05/2021 1807   HDL 38 (L) 09/29/2021 0901   CHOLHDL 5.6 (H) 09/29/2021 0901   LDLCALC 140 (H) 09/29/2021 0901     Risk Assessment/Calculations:    CHA2DS2-VASc Score = 5  This indicates a 7.2% annual risk of stroke. The patient's score is based upon: CHF History: 0 HTN History: 1 Diabetes History: 0 Stroke History: 0 Vascular Disease History: 1 Age Score: 2 Gender Score: 1    Physical Exam:    VS:  BP 128/68   Pulse 71   Ht '4\' 11"'  (1.499 m)   Wt 107 lb (48.5 kg)   SpO2 98%   BMI 21.61 kg/m     Wt Readings from Last 3 Encounters:  08/05/22 107 lb (48.5 kg)  06/16/22 105 lb (47.6 kg)  05/25/22 110 lb (49.9 kg)     GEN:  Well nourished, well developed in no acute distress HEENT: Normal NECK: No JVD; No carotid bruits CARDIAC: RRR, 2/6 systolic murmur, rubs, gallops RESPIRATORY:  Clear to auscultation without rales, wheezing or rhonchi  ABDOMEN: Soft, non-tender, non-distended MUSCULOSKELETAL:  No edema; No deformity. 2+ pedal pulses, equal bilaterally SKIN: Warm and dry NEUROLOGIC:  Alert and oriented x 3 PSYCHIATRIC:  Normal affect   EKG:  EKG is  ordered today.  The ekg ordered today demonstrates NSR at 71 bpm, no ST abnormality  Diagnoses:    1. Paroxysmal atrial fibrillation (HCC)   2. Essential hypertension   3. Hyperlipidemia LDL goal <70   4. Coronary artery disease involving native coronary artery of native heart without angina pectoris    Assessment and Plan:     Paroxysmal atrial flutter on chronic anticoagulation: NSR at 71 bpm on EKG today.  She denies symptoms of irregular HR, palpitations, flutters, dyspnea since hospital discharge.  We discussed the need to continue anticoagulation with Eliquis.  She is on appropriate dose at 2.5 mg twice daily. Continue Toprol.  Will give her Lopressor 25 mg tablets for  her to take 1/2 to 1 tablet if she has HR > 100.  Advised she may call the office for advice if symptoms recur.  Minor bruising that may be attributed to continuing aspirin in error. No other concerns.   CAD without angina: History of DES x 2 to mid LAD and proximal to mid first diagonal, 40% lesion dRCA 2019. She denies chest pain, dyspnea, or other symptoms concerning for angina.  No indication for further ischemic evaluation at this time. Asa discontinued in the setting of starting DOAC, however she has continued to take it.  She will discontinue when she gets home.  Hyperlipidemia: LDL 135 on 06/14/22. Intolerant to statins, is on Repatha and ezetimibe. We will recheck fasting labs prior to next office visit.   Valve disease: 2/6 systolic murmur on exam.  Echo 06/17/2022 revealed mild mitral valve regurgitation, mild calcification of the aortic valve, mild aortic valve regurgitation, no aortic stenosis. Asymptomatic. Will recheck if clinically indicated.      Disposition: 6 months with me  Medication Adjustments/Labs and Tests Ordered: Current medicines are reviewed at length with the patient today.  Concerns regarding medicines are outlined above.  Orders Placed This Encounter  Procedures   Comp Met (CMET)   Lipid Profile   EKG 12-Lead   Meds ordered this encounter  Medications   metoprolol tartrate (LOPRESSOR) 25 MG tablet    Sig: Take one half - one tablet (12.5 mg - 25 mg) as needed for Heart Rate 100 or above.    Dispense:  30 tablet    Refill:  3    Patient Instructions  Medication Instructions:   START Lopressor one-one half tablet ( 12.5 mg - 25 mg) as needed for elevated Heart Rate 100 or above.   *If you need a refill on your cardiac medications before your next appointment, please call your pharmacy*   Lab Work:  Your physician recommends that you return for a FASTING lipid profile/cmet. Tuesday, February 6. You can come in on the day of your appointment anytime  between 7:30-4:30 fasting from midnight the night before.    If you have labs (blood work) drawn today and your tests are completely normal, you will receive your results only by: [place] (if you have MyChart) OR A paper copy in the mail If you have any lab test that is abnormal or we need to change your treatment, we will call you to review the results.   Testing/Procedures:  None ordered.   Follow-Up: At [facility], you and your health needs are our priority.  As part of our continuing mission to provide you with exceptional heart care, we have created designated Provider Care Teams.  These Care Teams include your primary Cardiologist (physician) and Advanced Practice Providers (APPs -  Physician Assistants and Nurse Practitioners) who all work together to provide you with the care you need, when you need it.  We recommend signing up for the patient portal called "MyChart".  Sign up information is provided on this After Visit Summary.  MyChart is used to connect with patients for Virtual Visits (Telemedicine).  Patients are able to view lab/test results, encounter notes, upcoming appointments, etc.  Non-urgent messages can be sent to your provider as well.   To learn more about what you can do with MyChart, go to [email or web address].    Your next appointment:   6 month(s)  The format for your next appointment:  In Person  Provider:   Christen Bame, NP         Important Information About Sugar         Signed, Emmaline Life, NP  08/05/2022 1:23 PM    [place]

## 2022-08-05 ENCOUNTER — Ambulatory Visit: Payer: Medicare Other | Attending: Physician Assistant | Admitting: Nurse Practitioner

## 2022-08-05 ENCOUNTER — Encounter: Payer: Self-pay | Admitting: Nurse Practitioner

## 2022-08-05 VITALS — BP 128/68 | HR 71 | Ht 59.0 in | Wt 107.0 lb

## 2022-08-05 DIAGNOSIS — I2510 Atherosclerotic heart disease of native coronary artery without angina pectoris: Secondary | ICD-10-CM | POA: Diagnosis not present

## 2022-08-05 DIAGNOSIS — I10 Essential (primary) hypertension: Secondary | ICD-10-CM

## 2022-08-05 DIAGNOSIS — I480 Paroxysmal atrial fibrillation: Secondary | ICD-10-CM

## 2022-08-05 DIAGNOSIS — E785 Hyperlipidemia, unspecified: Secondary | ICD-10-CM | POA: Diagnosis not present

## 2022-08-05 MED ORDER — METOPROLOL TARTRATE 25 MG PO TABS: ORAL_TABLET | ORAL | 3 refills | Status: DC

## 2022-08-05 NOTE — Patient Instructions (Signed)
Medication Instructions:   START Lopressor one-one half tablet ( 12.5 mg - 25 mg) as needed for elevated Heart Rate 100 or above.   *If you need a refill on your cardiac medications before your next appointment, please call your pharmacy*   Lab Work:  Your physician recommends that you return for a FASTING lipid profile/cmet. Tuesday, February 6. You can come in on the day of your appointment anytime between 7:30-4:30 fasting from midnight the night before.    If you have labs (blood work) drawn today and your tests are completely normal, you will receive your results only by: MyChart Message (if you have MyChart) OR A paper copy in the mail If you have any lab test that is abnormal or we need to change your treatment, we will call you to review the results.   Testing/Procedures:  None ordered.   Follow-Up: At [facility], you and your health needs are our priority.  As part of our continuing mission to provide you with exceptional heart care, we have created designated Provider Care Teams.  These Care Teams include your primary Cardiologist (physician) and Advanced Practice Providers (APPs -  Physician Assistants and Nurse Practitioners) who all work together to provide you with the care you need, when you need it.  We recommend signing up for the patient portal called "MyChart".  Sign up information is provided on this After Visit Summary.  MyChart is used to connect with patients for Virtual Visits (Telemedicine).  Patients are able to view lab/test results, encounter notes, upcoming appointments, etc.  Non-urgent messages can be sent to your provider as well.   To learn more about what you can do with MyChart, go to [email or web address].    Your next appointment:   6 month(s)  The format for your next appointment:   In Person  Provider:   Eligha Bridegroom, NP         Important Information About Sugar

## 2022-08-11 DIAGNOSIS — J029 Acute pharyngitis, unspecified: Secondary | ICD-10-CM | POA: Diagnosis not present

## 2022-08-11 DIAGNOSIS — R059 Cough, unspecified: Secondary | ICD-10-CM | POA: Diagnosis not present

## 2022-08-11 DIAGNOSIS — J449 Chronic obstructive pulmonary disease, unspecified: Secondary | ICD-10-CM | POA: Diagnosis not present

## 2022-08-11 DIAGNOSIS — Z03818 Encounter for observation for suspected exposure to other biological agents ruled out: Secondary | ICD-10-CM | POA: Diagnosis not present

## 2022-08-16 ENCOUNTER — Encounter (HOSPITAL_COMMUNITY): Payer: Self-pay

## 2022-08-16 ENCOUNTER — Other Ambulatory Visit: Payer: Self-pay

## 2022-08-16 ENCOUNTER — Encounter (HOSPITAL_BASED_OUTPATIENT_CLINIC_OR_DEPARTMENT_OTHER): Payer: Self-pay | Admitting: Obstetrics and Gynecology

## 2022-08-16 ENCOUNTER — Emergency Department (HOSPITAL_BASED_OUTPATIENT_CLINIC_OR_DEPARTMENT_OTHER): Payer: Medicare Other

## 2022-08-16 ENCOUNTER — Observation Stay (HOSPITAL_BASED_OUTPATIENT_CLINIC_OR_DEPARTMENT_OTHER)
Admission: EM | Admit: 2022-08-16 | Discharge: 2022-08-17 | Disposition: A | Discharge status: Home / Self Care | Payer: Medicare Other | Attending: Internal Medicine | Admitting: Internal Medicine

## 2022-08-16 DIAGNOSIS — I10 Essential (primary) hypertension: Secondary | ICD-10-CM | POA: Diagnosis not present

## 2022-08-16 DIAGNOSIS — Z7901 Long term (current) use of anticoagulants: Secondary | ICD-10-CM | POA: Diagnosis not present

## 2022-08-16 DIAGNOSIS — I110 Hypertensive heart disease with heart failure: Secondary | ICD-10-CM | POA: Diagnosis not present

## 2022-08-16 DIAGNOSIS — I480 Paroxysmal atrial fibrillation: Secondary | ICD-10-CM | POA: Diagnosis not present

## 2022-08-16 DIAGNOSIS — Z79899 Other long term (current) drug therapy: Secondary | ICD-10-CM | POA: Insufficient documentation

## 2022-08-16 DIAGNOSIS — J441 Chronic obstructive pulmonary disease with (acute) exacerbation: Secondary | ICD-10-CM | POA: Diagnosis not present

## 2022-08-16 DIAGNOSIS — Z955 Presence of coronary angioplasty implant and graft: Secondary | ICD-10-CM | POA: Insufficient documentation

## 2022-08-16 DIAGNOSIS — Z7722 Contact with and (suspected) exposure to environmental tobacco smoke (acute) (chronic): Secondary | ICD-10-CM | POA: Diagnosis not present

## 2022-08-16 DIAGNOSIS — Z20822 Contact with and (suspected) exposure to covid-19: Secondary | ICD-10-CM | POA: Diagnosis not present

## 2022-08-16 DIAGNOSIS — I5032 Chronic diastolic (congestive) heart failure: Secondary | ICD-10-CM | POA: Diagnosis not present

## 2022-08-16 DIAGNOSIS — I2510 Atherosclerotic heart disease of native coronary artery without angina pectoris: Secondary | ICD-10-CM | POA: Diagnosis not present

## 2022-08-16 DIAGNOSIS — R0600 Dyspnea, unspecified: Secondary | ICD-10-CM | POA: Diagnosis not present

## 2022-08-16 DIAGNOSIS — R059 Cough, unspecified: Secondary | ICD-10-CM | POA: Diagnosis present

## 2022-08-16 LAB — HEPATIC FUNCTION PANEL
ALT: 13 U/L (ref 0–44)
AST: 16 U/L (ref 15–41)
Albumin: 4.5 g/dL (ref 3.5–5.0)
Alkaline Phosphatase: 42 U/L (ref 38–126)
Bilirubin, Direct: 0.2 mg/dL (ref 0.0–0.2)
Indirect Bilirubin: 0.6 mg/dL (ref 0.3–0.9)
Total Bilirubin: 0.8 mg/dL (ref 0.3–1.2)
Total Protein: 6.8 g/dL (ref 6.5–8.1)

## 2022-08-16 LAB — TROPONIN I (HIGH SENSITIVITY)
Troponin I (High Sensitivity): 10 ng/L (ref <18)
Troponin I (High Sensitivity): 13 ng/L (ref <18)

## 2022-08-16 LAB — BRAIN NATRIURETIC PEPTIDE: B Natriuretic Peptide: 70.4 pg/mL (ref 0.0–100.0)

## 2022-08-16 LAB — BASIC METABOLIC PANEL
Anion gap: 10 (ref 5–15)
BUN: 31 mg/dL — ABNORMAL HIGH (ref 8–23)
CO2: 27 mmol/L (ref 22–32)
Calcium: 10.3 mg/dL (ref 8.9–10.3)
Chloride: 103 mmol/L (ref 98–111)
Creatinine, Ser: 1.06 mg/dL — ABNORMAL HIGH (ref 0.44–1.00)
GFR, Estimated: 50 mL/min — ABNORMAL LOW (ref >60)
Glucose, Bld: 123 mg/dL — ABNORMAL HIGH (ref 70–99)
Potassium: 3.8 mmol/L (ref 3.5–5.1)
Sodium: 140 mmol/L (ref 135–145)

## 2022-08-16 LAB — CBC
HCT: 37.8 % (ref 36.0–46.0)
Hemoglobin: 12.7 g/dL (ref 12.0–15.0)
MCH: 35 pg — ABNORMAL HIGH (ref 26.0–34.0)
MCHC: 33.6 g/dL (ref 30.0–36.0)
MCV: 104.1 fL — ABNORMAL HIGH (ref 80.0–100.0)
Platelets: 266 10*3/uL (ref 150–400)
RBC: 3.63 MIL/uL — ABNORMAL LOW (ref 3.87–5.11)
RDW: 13.6 % (ref 11.5–15.5)
WBC: 11.8 10*3/uL — ABNORMAL HIGH (ref 4.0–10.5)
nRBC: 0 % (ref 0.0–0.2)

## 2022-08-16 LAB — SARS CORONAVIRUS 2 BY RT PCR: SARS Coronavirus 2 by RT PCR: NEGATIVE

## 2022-08-16 MED ORDER — METHYLPREDNISOLONE SODIUM SUCC 125 MG IJ SOLR
125.0000 mg | Freq: Once | INTRAMUSCULAR | Status: AC
  Administered 2022-08-16: 125 mg via INTRAVENOUS
  Filled 2022-08-16: qty 2

## 2022-08-16 MED ORDER — DOXYCYCLINE HYCLATE 100 MG PO TABS: 100.0000 mg | ORAL_TABLET | Freq: Two times a day (BID) | ORAL | Status: DC

## 2022-08-16 MED ORDER — LISINOPRIL-HYDROCHLOROTHIAZIDE 20-25 MG PO TABS: 0.5000 | ORAL_TABLET | Freq: Every morning | ORAL | Status: DC

## 2022-08-16 MED ORDER — METOPROLOL SUCCINATE ER 25 MG PO TB24
25.0000 mg | ORAL_TABLET | Freq: Every day | ORAL | Status: DC
  Administered 2022-08-16 – 2022-08-17 (×2): 25 mg via ORAL
  Filled 2022-08-16 (×2): qty 1

## 2022-08-16 MED ORDER — MAGNESIUM CHLORIDE 64 MG PO TBEC
1.0000 | DELAYED_RELEASE_TABLET | Freq: Two times a day (BID) | ORAL | Status: DC
  Administered 2022-08-16: 64 mg via ORAL
  Filled 2022-08-16 (×3): qty 1

## 2022-08-16 MED ORDER — ALBUTEROL SULFATE (2.5 MG/3ML) 0.083% IN NEBU
INHALATION_SOLUTION | RESPIRATORY_TRACT | Status: AC
  Administered 2022-08-16: 2.5 mg
  Filled 2022-08-16: qty 3

## 2022-08-16 MED ORDER — IPRATROPIUM-ALBUTEROL 0.5-2.5 (3) MG/3ML IN SOLN
3.0000 mL | Freq: Once | RESPIRATORY_TRACT | Status: AC
  Administered 2022-08-16: 3 mL via RESPIRATORY_TRACT
  Filled 2022-08-16: qty 3

## 2022-08-16 MED ORDER — ONDANSETRON HCL 4 MG/2ML IJ SOLN: 4.0000 mg | Freq: Four times a day (QID) | INTRAMUSCULAR | Status: DC | PRN

## 2022-08-16 MED ORDER — ACETAMINOPHEN 650 MG RE SUPP: 650.0000 mg | Freq: Four times a day (QID) | RECTAL | Status: DC | PRN

## 2022-08-16 MED ORDER — BUDESON-GLYCOPYRROL-FORMOTEROL 160-9-4.8 MCG/ACT IN AERO: 2.0000 | INHALATION_SPRAY | Freq: Two times a day (BID) | RESPIRATORY_TRACT | Status: DC | PRN

## 2022-08-16 MED ORDER — PREDNISONE 20 MG PO TABS
40.0000 mg | ORAL_TABLET | Freq: Every day | ORAL | Status: DC
  Administered 2022-08-17: 40 mg via ORAL
  Filled 2022-08-16: qty 2

## 2022-08-16 MED ORDER — APIXABAN 2.5 MG PO TABS
2.5000 mg | ORAL_TABLET | Freq: Two times a day (BID) | ORAL | Status: DC
  Administered 2022-08-16 – 2022-08-17 (×2): 2.5 mg via ORAL
  Filled 2022-08-16 (×2): qty 1

## 2022-08-16 MED ORDER — ENOXAPARIN SODIUM 40 MG/0.4ML IJ SOSY: 40.0000 mg | PREFILLED_SYRINGE | INTRAMUSCULAR | Status: DC

## 2022-08-16 MED ORDER — MELATONIN 3 MG PO TABS
3.0000 mg | ORAL_TABLET | Freq: Once | ORAL | Status: AC
  Administered 2022-08-16: 3 mg via ORAL
  Filled 2022-08-16: qty 1

## 2022-08-16 MED ORDER — ONDANSETRON HCL 4 MG PO TABS: 4.0000 mg | ORAL_TABLET | Freq: Four times a day (QID) | ORAL | Status: DC | PRN

## 2022-08-16 MED ORDER — ALBUTEROL SULFATE (2.5 MG/3ML) 0.083% IN NEBU: 2.5000 mg | INHALATION_SOLUTION | RESPIRATORY_TRACT | Status: DC | PRN

## 2022-08-16 MED ORDER — ORAL CARE MOUTH RINSE: 15.0000 mL | OROMUCOSAL | Status: DC | PRN

## 2022-08-16 MED ORDER — ACETAMINOPHEN 325 MG PO TABS: 650.0000 mg | ORAL_TABLET | Freq: Four times a day (QID) | ORAL | Status: DC | PRN

## 2022-08-16 MED ORDER — LISINOPRIL 10 MG PO TABS
10.0000 mg | ORAL_TABLET | Freq: Every day | ORAL | Status: DC
  Administered 2022-08-16 – 2022-08-17 (×2): 10 mg via ORAL
  Filled 2022-08-16 (×2): qty 1

## 2022-08-16 MED ORDER — UMECLIDINIUM BROMIDE 62.5 MCG/ACT IN AEPB: 1.0000 | INHALATION_SPRAY | Freq: Every day | RESPIRATORY_TRACT | Status: DC | PRN

## 2022-08-16 MED ORDER — IPRATROPIUM-ALBUTEROL 0.5-2.5 (3) MG/3ML IN SOLN: 3.0000 mL | Freq: Once | RESPIRATORY_TRACT | Status: DC

## 2022-08-16 MED ORDER — ALBUTEROL SULFATE (2.5 MG/3ML) 0.083% IN NEBU: 5.0000 mg | INHALATION_SOLUTION | Freq: Once | RESPIRATORY_TRACT | Status: DC

## 2022-08-16 MED ORDER — HYDROCHLOROTHIAZIDE 12.5 MG PO TABS
12.5000 mg | ORAL_TABLET | Freq: Every day | ORAL | Status: DC
  Administered 2022-08-16 – 2022-08-17 (×2): 12.5 mg via ORAL
  Filled 2022-08-16 (×2): qty 1

## 2022-08-16 MED ORDER — MECLIZINE HCL 25 MG PO TABS: 12.5000 mg | ORAL_TABLET | Freq: Three times a day (TID) | ORAL | Status: DC

## 2022-08-16 MED ORDER — ALBUTEROL SULFATE HFA 108 (90 BASE) MCG/ACT IN AERS: 2.0000 | INHALATION_SPRAY | RESPIRATORY_TRACT | Status: DC | PRN

## 2022-08-16 MED ORDER — ALBUTEROL SULFATE (2.5 MG/3ML) 0.083% IN NEBU: 2.5000 mg | INHALATION_SOLUTION | Freq: Once | RESPIRATORY_TRACT | Status: DC

## 2022-08-16 MED ORDER — MOMETASONE FURO-FORMOTEROL FUM 100-5 MCG/ACT IN AERO: 2.0000 | INHALATION_SPRAY | Freq: Two times a day (BID) | RESPIRATORY_TRACT | Status: DC | PRN

## 2022-08-16 MED ORDER — IPRATROPIUM-ALBUTEROL 0.5-2.5 (3) MG/3ML IN SOLN
RESPIRATORY_TRACT | Status: AC
  Administered 2022-08-16: 3 mL
  Filled 2022-08-16: qty 3

## 2022-08-16 MED ORDER — IPRATROPIUM-ALBUTEROL 0.5-2.5 (3) MG/3ML IN SOLN
3.0000 mL | Freq: Four times a day (QID) | RESPIRATORY_TRACT | Status: DC
  Administered 2022-08-16: 3 mL via RESPIRATORY_TRACT
  Filled 2022-08-16: qty 3

## 2022-08-16 NOTE — ED Triage Notes (Signed)
Patient reports to the ER for COPD exacerbation. Patient reports she is having trouble breathing. Patient reports no chest pain. Patient reports she has a productive cough.

## 2022-08-16 NOTE — H&P (Signed)
History and Physical    Sydney Gross [id number] DOB: 05-16-1930 DOA: 08/16/2022  I have briefly reviewed the patient's prior medical records in [place]  PCP: Mayra Neer, MD  Patient coming from: [facility]  Chief Complaint: SOB, cough  HPI: Sydney Gross is a 88 y.o. female with medical history significant of a fib, COPD who had a positive sick contact with her family 2 weeks ago.   She then developed cough, nasal congestion, and SOB for around 10 days, worsening over the last 7. She was seen by her PCP and given an albuterol inhaler without much improvement.  Cough is productive of a yellow sputum.  No fever. In the ER, COVID negative, she was placed on O2 (not on when I saw her)), albuterol and solumedrol with improvements in her symptoms.   Chest x ray was negative for PNA and labs essentially unremarkable   Review of Systems: As per HPI otherwise 10 point review of systems negative.   Past Medical History:  Diagnosis Date   Allergic rhinitis    Arthritis    "maybe a little in my knees and back" (07/17/2018)   Chronic lower back pain    Colitis 12/2013   "never had this before"   COPD (chronic obstructive pulmonary disease) ([place])    "questionable/Dr. Tamala Julian" (07/17/2018)   Coronary artery disease    Family history of anesthesia complication    "mother; think they gave her too much; c/o pain after OR; gave her more RX; they had to bring her back real quick"   GI bleed    H/O calcium pyrophosphate deposition disease (CPPD)    Heart murmur    Hiatal hernia    Hyperlipidemia    Hypertension    Mild mitral regurgitation    Numbness in feet    On home oxygen therapy    "I've used it 2 times in 2 yrs" (07/17/2018)   PAD (peripheral artery disease) (HCC)    PAF (paroxysmal atrial fibrillation) (HCC)    Proteinuria    Pulmonary hypertension ([place])    Shingles    Stenosis of tear duct     Past Surgical History:  Procedure Laterality Date   ABDOMINAL  AORTOGRAM W/LOWER EXTREMITY N/A 07/10/2021   Procedure: ABDOMINAL AORTOGRAM W/LOWER EXTREMITY;  Surgeon: Elam Dutch, MD;  Location: [place] [facility];  Service: Cardiovascular;  Laterality: N/A;   ABDOMINAL HYSTERECTOMY  1960's   APPENDECTOMY     BACK SURGERY     BIOPSY  04/20/2021   Procedure: BIOPSY;  Surgeon: Carol Ada, MD;  Location: [facility];  Service: Endoscopy;;   BLEPHAROPLASTY Bilateral 05/2017   CARDIAC CATHETERIZATION     CATARACT EXTRACTION, BILATERAL Bilateral    CESAREAN SECTION  1957; 1960   CORONARY STENT INTERVENTION N/A 07/17/2018   Procedure: CORONARY STENT INTERVENTION;  Surgeon: Belva Crome, MD;  Location: [place] [facility];  Service: Cardiovascular;  Laterality: N/A;   DACROCYSTORHINOSTOMY W/ JONES TUBE Bilateral 05/2017   "put tear duct in my eyes"   ESOPHAGOGASTRODUODENOSCOPY (EGD) WITH PROPOFOL N/A 04/20/2021   Procedure: ESOPHAGOGASTRODUODENOSCOPY (EGD) WITH PROPOFOL;  Surgeon: Carol Ada, MD;  Location: [facility];  Service: Endoscopy;  Laterality: N/A;   EYE SURGERY     LAPAROSCOPIC CHOLECYSTECTOMY  ~ 2009   LUMBAR DISC SURGERY  ?2004   PERIPHERAL VASCULAR INTERVENTION Left 07/10/2021   Procedure: PERIPHERAL VASCULAR INTERVENTION;  Surgeon: Elam Dutch, MD;  Location: [place] [facility];  Service: Cardiovascular;  Laterality:  Left;  common, external iliac arteries   RIGHT/LEFT HEART CATH AND CORONARY ANGIOGRAPHY N/A 07/17/2018   Procedure: RIGHT/LEFT HEART CATH AND CORONARY ANGIOGRAPHY;  Surgeon: Lyn Records, MD;  Location: [facility];  Service: Cardiovascular;  Laterality: N/A;   TONSILLECTOMY AND ADENOIDECTOMY  ?1940's     reports that she has never smoked. She has been exposed to tobacco smoke. She has never used smokeless tobacco. She reports current alcohol use of about 4.0 standard drinks of alcohol per week. She reports that she does not use drugs.  Allergies  Allergen Reactions   Brilinta [Ticagrelor] Shortness Of  Breath   Codeine Nausea And Vomiting   Darvon [Propoxyphene] Nausea Only   Lipitor [Atorvastatin] Other (See Comments)    MYALGIAS   Rosuvastatin Other (See Comments)    MYALGIAS    Simvastatin Other (See Comments)    MYALGIAS    Ezetimibe     aches Other reaction(s): aches   Nexlizet [Bempedoic Acid-Ezetimibe]     Muscle aches (tolerates Zetia just fine, allergy is with bempedoic acid component)   Tramadol Hcl Nausea Only    nausea (08/2019)    Family History  Problem Relation Age of Onset   Emphysema Father 51   CVA Mother    Cancer Brother        ? type    Prior to Admission medications   Medication Sig Start Date End Date Taking? Authorizing Provider  acetaminophen (TYLENOL) 650 MG CR tablet Take 1,300 mg by mouth as needed for pain.    [provider]  albuterol (VENTOLIN HFA) 108 (90 Base) MCG/ACT inhaler SMARTSIG:1 Puff(s) By Mouth 4 Times Daily PRN 08/11/22   [provider]  apixaban (ELIQUIS) 2.5 MG TABS tablet Take 1 tablet (2.5 mg total) by mouth 2 (two) times daily. 06/18/22   Jonita Albee, PA-C  Budeson-Glycopyrrol-Formoterol (BREZTRI AEROSPHERE) 160-9-4.8 MCG/ACT AERO Inhale 2 puffs into the lungs 2 (two) times daily as needed (Shortness of breath/ COPD). 11/15/19   [provider]  denosumab (PROLIA) 60 MG/ML SOSY injection Inject 60 mg as directed every 6 (six) months.    [provider]  diphenhydramine-acetaminophen (TYLENOL PM) 25-500 MG TABS tablet Take 1 tablet by mouth at bedtime.    [provider]  Evolocumab (REPATHA SURECLICK) 140 MG/ML SOAJ INJECT 1 PEN INTO THE SKIN EVERY 14 DAYS 07/19/22   Lyn Records, MD  ezetimibe (ZETIA) 10 MG tablet Take 1 tablet by mouth once daily 03/24/22   Lyn Records, MD  lisinopril-hydrochlorothiazide (ZESTORETIC) 20-25 MG tablet Take 0.5 tablets by mouth in the morning. 06/18/22   Jonita Albee, PA-C  Magnesium Cl-Calcium Carbonate (SLOW-MAG PO) Take 1 tablet by  mouth 2 (two) times daily.    [provider]  meclizine (ANTIVERT) 12.5 MG tablet Take 1 tablet (12.5 mg total) by mouth 3 (three) times daily. 06/18/22   Jonita Albee, PA-C  metoprolol succinate (TOPROL-XL) 25 MG 24 hr tablet Take 1 tablet (25 mg total) by mouth daily. Pt needs to make appt to see provider for further refills - 2nd attempt 06/18/22   Jonita Albee, PA-C  metoprolol tartrate (LOPRESSOR) 25 MG tablet Take one half - one tablet (12.5 mg - 25 mg) as needed for Heart Rate 100 or above. 08/05/22   Swinyer, Zachary George, NP  nitroGLYCERIN (NITROSTAT) 0.4 MG SL tablet Place 0.4 mg under the tongue every 5 (five) minutes as needed for chest pain.    [provider]  predniSONE (DELTASONE) 10 MG tablet Take 1 tablet by mouth daily in the afternoon. 07/27/22   [provider]    Physical Exam: Vitals:   08/16/22 1300 08/16/22 1400 08/16/22 1500 08/16/22 1603  BP: (!) 148/91 (!) 132/90 118/67 (!) 151/79  Pulse: (!) 109 (!) 105 (!) 105 (!) 106  Resp: (!) 28 20  16   Temp:   98 F (36.7 C) 98.7 F (37.1 C)  TempSrc:    Oral  SpO2: 95% 94% 94% 94%  Weight:      Height:          Constitutional: NAD, calm, comfortable Eyes: PERRL, lids and conjunctivae normal ENMT: Mucous membranes are moist. Posterior pharynx clear of any exudate or lesions.Normal dentition.  Neck: normal, supple, no masses, no thyromegaly Respiratory: wheezing throughout Normal respiratory effort. No accessory muscle use.  Cardiovascular: Regular rate and rhythm, no murmurs / rubs / gallops. No extremity edema. 2+ pedal pulses.  Abdomen: no tenderness, no masses palpated. Bowel sounds positive.  Musculoskeletal: no clubbing / cyanosis. Normal muscle tone.  Skin: no rashes, lesions, ulcers. No induration Neurologic: CN 2-12 grossly intact. Strength 5/5 in all 4.  Psychiatric: Normal judgment and insight. Alert and oriented x 3. Normal mood.   Labs on Admission: I have  personally reviewed following labs and imaging studies  CBC: Recent Labs  Lab 08/16/22 1132  WBC 11.8*  HGB 12.7  HCT 37.8  MCV 104.1*  PLT 123456   Basic Metabolic Panel: Recent Labs  Lab 08/16/22 1132  NA 140  K 3.8  CL 103  CO2 27  GLUCOSE 123*  BUN 31*  CREATININE 1.06*  CALCIUM 10.3   GFR: Estimated Creatinine Clearance: 24.1 mL/min (A) (by C-G formula based on SCr of 1.06 mg/dL (H)). Liver Function Tests: Recent Labs  Lab 08/16/22 1132  AST 16  ALT 13  ALKPHOS 42  BILITOT 0.8  PROT 6.8  ALBUMIN 4.5   No results for input(s): "LIPASE", "AMYLASE" in the last 168 hours. No results for input(s): "AMMONIA" in the last 168 hours. Coagulation Profile: No results for input(s): "INR", "PROTIME" in the last 168 hours. Cardiac Enzymes: No results for input(s): "CKTOTAL", "CKMB", "CKMBINDEX", "TROPONINI" in the last 168 hours. BNP (last 3 results) No results for input(s): "PROBNP" in the last 8760 hours. HbA1C: No results for input(s): "HGBA1C" in the last 72 hours. CBG: No results for input(s): "GLUCAP" in the last 168 hours. Lipid Profile: No results for input(s): "CHOL", "HDL", "LDLCALC", "TRIG", "CHOLHDL", "LDLDIRECT" in the last 72 hours. Thyroid Function Tests: No results for input(s): "TSH", "T4TOTAL", "FREET4", "T3FREE", "THYROIDAB" in the last 72 hours. Anemia Panel: No results for input(s): "VITAMINB12", "FOLATE", "FERRITIN", "TIBC", "IRON", "RETICCTPCT" in the last 72 hours. Urine analysis:    Component Value Date/Time   COLORURINE YELLOW 10/05/2021 2127   APPEARANCEUR HAZY (A) 10/05/2021 2127   LABSPEC 1.020 10/05/2021 2127   PHURINE 5.0 10/05/2021 2127   GLUCOSEU NEGATIVE 10/05/2021 2127   HGBUR NEGATIVE 10/05/2021 2127   [place] 10/05/2021 2127   [place] 10/05/2021 2127   PROTEINUR NEGATIVE 10/05/2021 2127   UROBILINOGEN 0.2 12/09/2013 1902   NITRITE POSITIVE (A) 10/05/2021 2127   LEUKOCYTESUR LARGE (A) 10/05/2021 2127      Radiological Exams on Admission: DG Chest Port 1 View  Result Date: 08/16/2022 CLINICAL DATA:  Dyspnea.  Pulmonary hypertension. EXAM: PORTABLE CHEST 1 VIEW COMPARISON:  06/16/2022 FINDINGS: Heart size and mediastinal contours are stable. Aortic atherosclerotic calcifications.  No pleural effusion or edema. No airspace opacities identified. Thoracic lumbar scoliosis. IMPRESSION: No acute cardiopulmonary abnormalities. Aortic Atherosclerosis (ICD10-I70.0). Electronically Signed   By: Signa Kell M.D.   On: 08/16/2022 11:25      Assessment/Plan Principal Problem:   COPD with acute exacerbation (HCC) Active Problems:   Hypertension   Chronic diastolic CHF (congestive heart failure) (HCC)   Paroxysmal atrial fibrillation (HCC)    COPD exacerbation -probably triggered by viral infection -IV steroids to PO steroids -nebs- PRN and schedule -NP swab -hold on abx as she has history of c diff (no fever and only mild WBC elevation) -home O2 study in AM -flutter valve  HTN -resume home meds  Parox afib -resume home BB and eliquis  Chronic diastolic CHF -check BNP to rule out SOB from heart failure  DVT prophylaxis: eliquis  Code Status: partial  Family Communication: none at bedside Disposition Plan: home in 24-48 hours Consults called: none    Admission status: obs   At the point of initial evaluation, it is my clinical opinion that admission for OBSERVATION is reasonable and necessary because the patient's presenting complaints in the context of their chronic conditions represent sufficient risk of deterioration or significant morbidity to constitute reasonable grounds for close observation in the hospital setting, but that the patient may be medically stable for discharge from the hospital within 24 to 48 hours.      Joseph Art [facility]   How to contact the [facility] Attending or Consulting provider 7A - 7P or covering provider during after hours 7P -7A,  for this patient?  Check the care team in [facility] and look for a) attending/consulting TRH provider listed and b) the [facility] team listed Log into [email or web address] and use [place]'s universal password to access. If you do not have the password, please contact the hospital operator. Locate the [facility] provider you are looking for under [facility] and page to a number that you can be directly reached. If you still have difficulty reaching the provider, please page the [facility] (Director on Call) for the Hospitalists listed on amion for assistance.  08/16/2022, 5:15 PM

## 2022-08-16 NOTE — ED Provider Notes (Signed)
[facility] EMERGENCY DEPT Provider Note   CSN: [id number] Arrival date & time: 08/16/22  1050     History  Chief Complaint  Patient presents with   COPD    Sydney Gross is a 88 y.o. female.  HPI 88 year old female history of COPD, hypertension, peripheral artery disease, coronary artery disease, chronic CHF, hyperlipidemia, with diagnosis of A-fib with RVR, now on Eliquis, presents today complaining of cough, nasal congestion, dyspnea for the past 12 days.  She states that [birth year], [birth year] she began having some nasal congestion and a cough consistent with URI.  She had no known sick exposures.  She has continued to have cough and has having increasing dyspnea.  She was seen by her primary care doctor last week and was given an albuterol inhaler.  She does not report any recent steroids or other changes in her medication.  She has continued to have cough productive of yellow sputum.  She does not report any fever or chills.  She had sudden episode of low back pain last night She has been eating and drinking without difficulty.  She reports a negative COVID test in the office      Home Medications Prior to Admission medications   Medication Sig Start Date End Date Taking? Authorizing Provider  acetaminophen (TYLENOL) 650 MG CR tablet Take 1,300 mg by mouth as needed for pain.    [provider]  albuterol (VENTOLIN HFA) 108 (90 Base) MCG/ACT inhaler SMARTSIG:1 Puff(s) By Mouth 4 Times Daily PRN 08/11/22   [provider]  apixaban (ELIQUIS) 2.5 MG TABS tablet Take 1 tablet (2.5 mg total) by mouth 2 (two) times daily. 06/18/22   Jonita Albee, PA-C  Budeson-Glycopyrrol-Formoterol (BREZTRI AEROSPHERE) 160-9-4.8 MCG/ACT AERO Inhale 2 puffs into the lungs 2 (two) times daily as needed (Shortness of breath/ COPD). 11/15/19   [provider]  denosumab (PROLIA) 60 MG/ML SOSY injection Inject 60 mg as directed every 6 (six) months.     [provider]  diphenhydramine-acetaminophen (TYLENOL PM) 25-500 MG TABS tablet Take 1 tablet by mouth at bedtime.    [provider]  Evolocumab (REPATHA SURECLICK) 140 MG/ML SOAJ INJECT 1 PEN INTO THE SKIN EVERY 14 DAYS 07/19/22   Lyn Records, MD  ezetimibe (ZETIA) 10 MG tablet Take 1 tablet by mouth once daily 03/24/22   Lyn Records, MD  lisinopril-hydrochlorothiazide (ZESTORETIC) 20-25 MG tablet Take 0.5 tablets by mouth in the morning. 06/18/22   Jonita Albee, PA-C  Magnesium Cl-Calcium Carbonate (SLOW-MAG PO) Take 1 tablet by mouth 2 (two) times daily.    [provider]  meclizine (ANTIVERT) 12.5 MG tablet Take 1 tablet (12.5 mg total) by mouth 3 (three) times daily. 06/18/22   Jonita Albee, PA-C  metoprolol succinate (TOPROL-XL) 25 MG 24 hr tablet Take 1 tablet (25 mg total) by mouth daily. Pt needs to make appt to see provider for further refills - 2nd attempt 06/18/22   Jonita Albee, PA-C  metoprolol tartrate (LOPRESSOR) 25 MG tablet Take one half - one tablet (12.5 mg - 25 mg) as needed for Heart Rate 100 or above. 08/05/22   Swinyer, Zachary George, NP  nitroGLYCERIN (NITROSTAT) 0.4 MG SL tablet Place 0.4 mg under the tongue every 5 (five) minutes as needed for chest pain.    [provider]  predniSONE (DELTASONE) 10 MG tablet Take 1 tablet by mouth daily in the afternoon. 07/27/22   [provider]  Allergies    Brilinta [ticagrelor], Codeine, Darvon [propoxyphene], Lipitor [atorvastatin], Rosuvastatin, Simvastatin, Ezetimibe, Nexlizet [bempedoic acid-ezetimibe], and Tramadol hcl    Review of Systems   Review of Systems  Physical Exam Updated Vital Signs BP (!) 148/91   Pulse (!) 109   Temp 98.2 F (36.8 C) (Oral)   Resp (!) 28   Ht 1.499 m (4\' 11" )   Wt 48 kg   SpO2 95%   BMI 21.36 kg/m  Physical Exam Vitals and nursing note reviewed.  Constitutional:      General: She is in acute distress.      Appearance: Normal appearance. She is not ill-appearing.  HENT:     Head: Normocephalic.     Right Ear: External ear normal.     Left Ear: External ear normal.     Nose: Nose normal.     Mouth/Throat:     Pharynx: Oropharynx is clear.  Eyes:     Extraocular Movements: Extraocular movements intact.     Pupils: Pupils are equal, round, and reactive to light.  Cardiovascular:     Rate and Rhythm: Regular rhythm. Tachycardia present.     Pulses: Normal pulses.  Pulmonary:     Breath sounds: Wheezing present.  Abdominal:     General: Abdomen is flat. Bowel sounds are normal. There is distension.     Palpations: Abdomen is soft.  Musculoskeletal:        General: Normal range of motion.     Cervical back: Normal range of motion.  Skin:    General: Skin is warm.     Capillary Refill: Capillary refill takes less than 2 seconds.  Neurological:     General: No focal deficit present.     Mental Status: She is alert.  Psychiatric:        Mood and Affect: Mood normal.     ED Results / Procedures / Treatments   Labs (all labs ordered are listed, but only abnormal results are displayed) Labs Reviewed  BASIC METABOLIC PANEL - Abnormal; Notable for the following components:      Result Value   Glucose, Bld 123 (*)    BUN 31 (*)    Creatinine, Ser 1.06 (*)    GFR, Estimated 50 (*)    All other components within normal limits  CBC - Abnormal; Notable for the following components:   WBC 11.8 (*)    RBC 3.63 (*)    MCV 104.1 (*)    MCH 35.0 (*)    All other components within normal limits  SARS CORONAVIRUS 2 BY RT PCR  HEPATIC FUNCTION PANEL  TROPONIN I (HIGH SENSITIVITY)  TROPONIN I (HIGH SENSITIVITY)    EKG EKG Interpretation  Date/Time:  Monday August 16 2022 11:09:50 EDT Ventricular Rate:  106 PR Interval:  125 QRS Duration: 79 QT Interval:  318 QTC Calculation: 423 R Axis:   62 Text Interpretation: Sinus tachycardia Atrial premature complex Probable left atrial  enlargement Confirmed by 05-30-2002 [phone]) on 08/16/2022 11:14:46 AM  Radiology DG Chest Port 1 View  Result Date: 08/16/2022 CLINICAL DATA:  Dyspnea.  Pulmonary hypertension. EXAM: PORTABLE CHEST 1 VIEW COMPARISON:  06/16/2022 FINDINGS: Heart size and mediastinal contours are stable. Aortic atherosclerotic calcifications. No pleural effusion or edema. No airspace opacities identified. Thoracic lumbar scoliosis. IMPRESSION: No acute cardiopulmonary abnormalities. Aortic Atherosclerosis (ICD10-I70.0). Electronically Signed   By: 08/17/2022 M.D.   On: 08/16/2022 11:25    Procedures Procedures    Medications Ordered in ED  Medications  albuterol (VENTOLIN HFA) 108 (90 Base) MCG/ACT inhaler 2 puff (has no administration in time range)  albuterol (PROVENTIL) (2.5 MG/3ML) 0.083% nebulizer solution 2.5 mg (2.5 mg Nebulization Not Given 08/16/22 1131)  ipratropium-albuterol (DUONEB) 0.5-2.5 (3) MG/3ML nebulizer solution 3 mL (3 mLs Nebulization Not Given 08/16/22 1205)  albuterol (PROVENTIL) (2.5 MG/3ML) 0.083% nebulizer solution 5 mg (has no administration in time range)  ipratropium-albuterol (DUONEB) 0.5-2.5 (3) MG/3ML nebulizer solution 3 mL (3 mLs Nebulization Given 08/16/22 1205)  ipratropium-albuterol (DUONEB) 0.5-2.5 (3) MG/3ML nebulizer solution (3 mLs  Given 08/16/22 1108)  albuterol (PROVENTIL) (2.5 MG/3ML) 0.083% nebulizer solution (2.5 mg  Given 08/16/22 1108)  methylPREDNISolone sodium succinate (SOLU-MEDROL) 125 mg/2 mL injection 125 mg (125 mg Intravenous Given 08/16/22 1146)    ED Course/ Medical Decision Making/ A&P Clinical Course as of 08/16/22 1344  Mon Aug 16, 2022  1148 Chest x-Calypso Hagarty reviewed and interpreted with no evidence of acute abnormality and radiologist interpretation concurs [DR]  1304 CBC reviewed and interpreted and noted to have mild leukocytosis 11,800 [DR]  1304 Glucose(!): 123 Complete metabolic panel is reviewed and interpreted and within normal limits  [DR]   1304 SARS Coronavirus 2 by RT PCR (hospital order, performed in [facility] hospital lab) *cepheid single result test* Anterior Nasal Swab COVID test reviewed and interpreted and negative [DR]    Clinical Course User Index [DR] Margarita Grizzle, MD                           Medical Decision Making 88 year old female history of COPD, coronary artery disease, A-fib RVR, on Eliquis, presents today with almost 2 weeks of nasal congestion, cough, congestion, and some worsening dyspnea with productive cough.  Here my evaluation she is not febrile.  She does have diffuse wheezing. Patient is placed on oxygen, she was given albuterol and Solu-Medrol. Patient feels somewhat improved but remains tachypneic and tachycardic with oxygen saturation at 92% She is placed on 2 L nasal cannula She is given additional albuterol  Differential diagnosis includes but is not limited to acute infection including COVID, viral etiologies, acute pneumonia, cardiac etiologies of wheezing such as arrhythmias and CHF, upper airway infections, and upper airway obstructions. Chest x-Yara Tomkinson reviewed and no evidence of acute infiltrate Patient with ongoing wheezing despite steroids and dilators, patient with oxygen saturations around 92% but remains can significantly tachypneic and is requiring some oxygen to be comfortable No evidence of acute infection including no infiltrate on chest x-Broady Lafoy, mild leukocytosis, afebrile-do not think antibiotics are indicated at this time EKG is tachycardic but no evidence of arrhythmia or ischemia noted Plan admission for ongoing evaluation and treatment   Amount and/or Complexity of Data Reviewed Labs: ordered. Decision-making details documented in ED Course. Radiology: ordered and independent interpretation performed. Decision-making details documented in ED Course. Discussion of management or test interpretation with external provider(s): Care discussed with Dr. Jonathon Bellows who accepts for  admission  Risk Prescription drug management. Decision regarding hospitalization.           Final Clinical Impression(s) / ED Diagnoses Final diagnoses:  COPD exacerbation [facility])    Rx / DC Orders ED Discharge Orders     None         Margarita Grizzle, MD 08/16/22 1344

## 2022-08-16 NOTE — Hospital Course (Signed)
82f w/ COPD, hypertension, peripheral artery disease, coronary artery disease, chronic CHF, hyperlipidemia, with diagnosis of A-fib  on Eliquis, presented w/  cough, nasal congestion, dyspnea for the past 10 days,saw pcp  who put her on inhaler last week, In ED- diffuse wheezing, VSS, cxr clear. Wbc 11k. Givne soludmerol and admission requested.

## 2022-08-17 DIAGNOSIS — J441 Chronic obstructive pulmonary disease with (acute) exacerbation: Secondary | ICD-10-CM | POA: Diagnosis not present

## 2022-08-17 LAB — CBC
HCT: 32.2 % — ABNORMAL LOW (ref 36.0–46.0)
Hemoglobin: 11.5 g/dL — ABNORMAL LOW (ref 12.0–15.0)
MCH: 40.4 pg — ABNORMAL HIGH (ref 26.0–34.0)
MCHC: 35.7 g/dL (ref 30.0–36.0)
MCV: 113 fL — ABNORMAL HIGH (ref 80.0–100.0)
Platelets: 243 10*3/uL (ref 150–400)
RBC: 2.85 MIL/uL — ABNORMAL LOW (ref 3.87–5.11)
RDW: 15.4 % (ref 11.5–15.5)
WBC: 10.7 10*3/uL — ABNORMAL HIGH (ref 4.0–10.5)
nRBC: 0 % (ref 0.0–0.2)

## 2022-08-17 LAB — BASIC METABOLIC PANEL
Anion gap: 8 (ref 5–15)
BUN: 35 mg/dL — ABNORMAL HIGH (ref 8–23)
CO2: 25 mmol/L (ref 22–32)
Calcium: 9.7 mg/dL (ref 8.9–10.3)
Chloride: 106 mmol/L (ref 98–111)
Creatinine, Ser: 0.94 mg/dL (ref 0.44–1.00)
GFR, Estimated: 58 mL/min — ABNORMAL LOW (ref >60)
Glucose, Bld: 135 mg/dL — ABNORMAL HIGH (ref 70–99)
Potassium: 4.4 mmol/L (ref 3.5–5.1)
Sodium: 139 mmol/L (ref 135–145)

## 2022-08-17 MED ORDER — GUAIFENESIN ER 600 MG PO TB12: 600.0000 mg | ORAL_TABLET | Freq: Two times a day (BID) | ORAL | Status: DC | PRN

## 2022-08-17 MED ORDER — IPRATROPIUM-ALBUTEROL 0.5-2.5 (3) MG/3ML IN SOLN: 3.0000 mL | Freq: Three times a day (TID) | RESPIRATORY_TRACT | 0 refills | Status: DC

## 2022-08-17 MED ORDER — IPRATROPIUM-ALBUTEROL 0.5-2.5 (3) MG/3ML IN SOLN
3.0000 mL | Freq: Three times a day (TID) | RESPIRATORY_TRACT | Status: DC
  Administered 2022-08-17: 3 mL via RESPIRATORY_TRACT
  Filled 2022-08-17: qty 3

## 2022-08-17 MED ORDER — ALBUTEROL SULFATE (2.5 MG/3ML) 0.083% IN NEBU: 2.5000 mg | INHALATION_SOLUTION | RESPIRATORY_TRACT | 12 refills | Status: DC | PRN

## 2022-08-17 MED ORDER — PREDNISONE 20 MG PO TABS: 40.0000 mg | ORAL_TABLET | Freq: Every day | ORAL | 0 refills | Status: AC

## 2022-08-17 NOTE — TOC Transition Note (Signed)
Transition of Care [facility]) - CM/SW Discharge Note   Patient Details  Name: Sydney Gross MRN: [id number] Date of Birth: 08/18/1930  [facility] of [facility]) CM/SW Contact:  Lanier Clam, RN Phone Number: 08/17/2022, 11:05 AM   Clinical Narrative: [employer] rep Danielle-aware of orders for neb machine to deliver to rm prior d/c. No further CM needs.       Barriers to Discharge: No Barriers Identified   Patient Goals and CMS Choice        Discharge Placement                       Discharge Plan and Services                DME Arranged: Nebulizer machine DME Agency: [employer] Date DME Agency Contacted: 08/17/22 Time DME Agency Contacted: 1102 Representative spoke with at DME Agency:  Duwayne Heck)            Social Determinants of Health (SDOH) Interventions     Readmission Risk Interventions     No data to display

## 2022-08-17 NOTE — Progress Notes (Signed)
SATURATION QUALIFICATIONS: (This note is used to comply with regulatory documentation for home oxygen)  Patient Saturations on Room Air at Rest = 95%  Patient Saturations on Room Air while Ambulating = 95-98%  Patient Saturations on  Liters of oxygen while Ambulating = %  Please briefly explain why patient needs home oxygen: No O2 needed, patient able to maintain saturations in high 90's while ambulating on RA.

## 2022-08-17 NOTE — Progress Notes (Signed)
Patient and Patient's Husband given discharge teaching including all Medications and schedules for these Medications. Understanding verbalized and discharge AVS with the Patient at time of discharge

## 2022-08-17 NOTE — Discharge Summary (Signed)
Physician Discharge Summary  Sydney Gross WCH:[id number] DOB: 02-17-1930 DOA: 08/16/2022  PCP: Lupita Raider, MD  Admit date: 08/16/2022 Discharge date: 08/17/2022  Admitted From: home Discharge disposition: home   Recommendations for Outpatient Follow-Up:   New med: prednisone   Discharge Diagnosis:   Principal Problem:   COPD with acute exacerbation (HCC) Active Problems:   Hypertension   Chronic diastolic CHF (congestive heart failure) (HCC)   Paroxysmal atrial fibrillation (HCC)    Discharge Condition: Improved.  Diet recommendation: Low sodium, heart healthy  Wound care: None.  Code status: partial   History of Present Illness:   Sydney Gross is a 88 y.o. female with medical history significant of a fib, COPD who had a positive sick contact with her family 2 weeks ago.   She then developed cough, nasal congestion, and SOB for around 10 days, worsening over the last 7. She was seen by her PCP and given an albuterol inhaler without much improvement.  Cough is productive of a yellow sputum.  No fever. In the ER, COVID negative, she was placed on O2 (not on when I saw her)), albuterol and solumedrol with improvements in her symptoms.   Chest x ray was negative for PNA and labs essentially unremarkable     Hospital Course by Problem:   COPD exacerbation -probably triggered by viral infection -IV steroids to PO steroids with improvement of wheezing -nebs- PRN and scheduled -NP swab not done  by nursing -hold on abx as she has history of c diff (no fever and only mild WBC elevation) -n o need for home o2 -flutter valve   HTN -resume home meds   Parox afib -resume home BB and eliquis   Chronic diastolic CHF -BNP normal    Medical Consultants:      Discharge Exam:   Vitals:   08/17/22 0918 08/17/22 0954  BP: 115/64   Pulse:    Resp:    Temp:    SpO2:  95%   Vitals:   08/17/22 0412 08/17/22 0909 08/17/22 0918 08/17/22 0954   BP: 117/68  115/64   Pulse: 73     Resp: 18     Temp: 98.3 F (36.8 C)     TempSrc: Oral     SpO2: 97% 95%  95%  Weight:      Height:        General exam: Appears calm and comfortable.   The results of significant diagnostics from this hospitalization (including imaging, microbiology, ancillary and laboratory) are listed below for reference.     Procedures and Diagnostic Studies:   DG Chest Port 1 View  Result Date: 08/16/2022 CLINICAL DATA:  Dyspnea.  Pulmonary hypertension. EXAM: PORTABLE CHEST 1 VIEW COMPARISON:  06/16/2022 FINDINGS: Heart size and mediastinal contours are stable. Aortic atherosclerotic calcifications. No pleural effusion or edema. No airspace opacities identified. Thoracic lumbar scoliosis. IMPRESSION: No acute cardiopulmonary abnormalities. Aortic Atherosclerosis (ICD10-I70.0). Electronically Signed   By: Signa Kell M.D.   On: 08/16/2022 11:25     Labs:   Basic Metabolic Panel: Recent Labs  Lab 08/16/22 1132 08/17/22 0509  NA 140 139  K 3.8 4.4  CL 103 106  CO2 27 25  GLUCOSE 123* 135*  BUN 31* 35*  CREATININE 1.06* 0.94  CALCIUM 10.3 9.7   GFR Estimated Creatinine Clearance: 27.1 mL/min (by C-G formula based on SCr of 0.94 mg/dL). Liver Function Tests: Recent Labs  Lab 08/16/22 1132  AST 16  ALT 13  ALKPHOS 42  BILITOT 0.8  PROT 6.8  ALBUMIN 4.5   No results for input(s): "LIPASE", "AMYLASE" in the last 168 hours. No results for input(s): "AMMONIA" in the last 168 hours. Coagulation profile No results for input(s): "INR", "PROTIME" in the last 168 hours.  CBC: Recent Labs  Lab 08/16/22 1132 08/17/22 0509  WBC 11.8* 10.7*  HGB 12.7 11.5*  HCT 37.8 32.2*  MCV 104.1* 113.0*  PLT 266 243   Cardiac Enzymes: No results for input(s): "CKTOTAL", "CKMB", "CKMBINDEX", "TROPONINI" in the last 168 hours. BNP: Invalid input(s): "POCBNP" CBG: No results for input(s): "GLUCAP" in the last 168 hours. D-Dimer No results for  input(s): "DDIMER" in the last 72 hours. Hgb A1c No results for input(s): "HGBA1C" in the last 72 hours. Lipid Profile No results for input(s): "CHOL", "HDL", "LDLCALC", "TRIG", "CHOLHDL", "LDLDIRECT" in the last 72 hours. Thyroid function studies No results for input(s): "TSH", "T4TOTAL", "T3FREE", "THYROIDAB" in the last 72 hours.  Invalid input(s): "FREET3" Anemia work up No results for input(s): "VITAMINB12", "FOLATE", "FERRITIN", "TIBC", "IRON", "RETICCTPCT" in the last 72 hours. Microbiology Recent Results (from the past 240 hour(s))  SARS Coronavirus 2 by RT PCR (hospital order, performed in [facility] hospital lab) *cepheid single result test* Anterior Nasal Swab     Status: None   Collection Time: 08/16/22 11:26 AM   Specimen: Anterior Nasal Swab  Result Value Ref Range Status   SARS Coronavirus 2 by RT PCR NEGATIVE NEGATIVE Final    Comment: (NOTE) SARS-CoV-2 target nucleic acids are NOT DETECTED.  The SARS-CoV-2 RNA is generally detectable in upper and lower respiratory specimens during the acute phase of infection. The lowest concentration of SARS-CoV-2 viral copies this assay can detect is 250 copies / mL. A negative result does not preclude SARS-CoV-2 infection and should not be used as the sole basis for treatment or other patient management decisions.  A negative result may occur with improper specimen collection / handling, submission of specimen other than nasopharyngeal swab, presence of viral mutation(s) within the areas targeted by this assay, and inadequate number of viral copies (<250 copies / mL). A negative result must be combined with clinical observations, patient history, and epidemiological information.  Fact Sheet for Patients:   RoadLapTop.co.za  Fact Sheet for Healthcare Providers: http://kim-miller.com/  This test is not yet approved or  cleared by the [place] FDA and has been authorized for  detection and/or diagnosis of SARS-CoV-2 by FDA under an Emergency Use Authorization (EUA).  This EUA will remain in effect (meaning this test can be used) for the duration of the COVID-19 declaration under Section 564(b)(1) of the Act, 21 U.S.C. section 360bbb-3(b)(1), unless the authorization is terminated or revoked sooner.  Performed at [facility], [address], [place], [place] [address]      Discharge Instructions:   Discharge Instructions     Diet - low sodium heart healthy   Complete by: As directed    Increase activity slowly   Complete by: As directed       Allergies as of 08/17/2022       Reactions   Brilinta [ticagrelor] Shortness Of Breath   Codeine Nausea And Vomiting   Darvon [propoxyphene] Nausea Only   Lipitor [atorvastatin] Other (See Comments)   MYALGIAS   Rosuvastatin Other (See Comments)   MYALGIAS   Simvastatin Other (See Comments)   MYALGIAS   Tramadol Hcl Nausea Only   nausea (08/2019)        Medication  List     STOP taking these medications    meclizine 12.5 MG tablet Commonly known as: ANTIVERT       TAKE these medications    acetaminophen 650 MG CR tablet Commonly known as: TYLENOL Take 1,300 mg by mouth every 8 (eight) hours as needed for pain.   albuterol 108 (90 Base) MCG/ACT inhaler Commonly known as: VENTOLIN HFA Inhale 1 puff into the lungs every 6 (six) hours as needed for shortness of breath. What changed: Another medication with the same name was added. Make sure you understand how and when to take each.   albuterol (2.5 MG/3ML) 0.083% nebulizer solution Commonly known as: PROVENTIL Take 3 mLs (2.5 mg total) by nebulization every 2 (two) hours as needed for wheezing or shortness of breath. What changed: You were already taking a medication with the same name, and this prescription was added. Make sure you understand how and when to take each.   apixaban 2.5 MG Tabs tablet Commonly known as:  ELIQUIS Take 1 tablet (2.5 mg total) by mouth 2 (two) times daily.   Breztri Aerosphere 160-9-4.8 MCG/ACT Aero Generic drug: Budeson-Glycopyrrol-Formoterol Inhale 2 puffs into the lungs 2 (two) times daily as needed (Shortness of breath/ COPD).   denosumab 60 MG/ML Sosy injection Commonly known as: PROLIA Inject 60 mg as directed every 6 (six) months.   diphenhydramine-acetaminophen 25-500 MG Tabs tablet Commonly known as: TYLENOL PM Take 1 tablet by mouth at bedtime.   ezetimibe 10 MG tablet Commonly known as: ZETIA Take 1 tablet by mouth once daily   guaiFENesin 600 MG 12 hr tablet Commonly known as: MUCINEX Take 1 tablet (600 mg total) by mouth 2 (two) times daily as needed for cough or to loosen phlegm.   ipratropium-albuterol 0.5-2.5 (3) MG/3ML Soln Commonly known as: DUONEB Take 3 mLs by nebulization 3 (three) times daily.   lisinopril-hydrochlorothiazide 20-25 MG tablet Commonly known as: ZESTORETIC Take 0.5 tablets by mouth in the morning. What changed: when to take this   metoprolol succinate 25 MG 24 hr tablet Commonly known as: TOPROL-XL Take 1 tablet (25 mg total) by mouth daily. Pt needs to make appt to see provider for further refills - 2nd attempt   metoprolol tartrate 25 MG tablet Commonly known as: LOPRESSOR Take one half - one tablet (12.5 mg - 25 mg) as needed for Heart Rate 100 or above. What changed:  how much to take how to take this when to take this reasons to take this additional instructions   nitroGLYCERIN 0.4 MG SL tablet Commonly known as: NITROSTAT Place 0.4 mg under the tongue every 5 (five) minutes as needed for chest pain.   predniSONE 20 MG tablet Commonly known as: DELTASONE Take 2 tablets (40 mg total) by mouth daily with breakfast for 4 days. Start taking on: August 18, 2022 What changed:  medication strength how much to take when to take this   Repatha SureClick 140 MG/ML Soaj Generic drug: Evolocumab INJECT 1 PEN  INTO THE SKIN EVERY 14 DAYS What changed: See the new instructions.   SLOW-MAG PO Take 1 tablet by mouth 2 (two) times daily.   tiZANidine 4 MG capsule Commonly known as: ZANAFLEX Take 4 mg by mouth daily as needed for muscle spasms.               Durable Medical Equipment  (From admission, onward)           Start     Ordered   08/17/22  1740  For home use only DME Nebulizer machine  Once       Question Answer Comment  Patient needs a nebulizer to treat with the following condition COPD (chronic obstructive pulmonary disease) (HCC)   Length of Need Lifetime      08/17/22 0816            Follow-up Information     Lupita Raider, MD Follow up in 1 week(s).   Specialty: Family Medicine Contact information: [address] [place] [address] [phone]         Lyn Records, MD .   Specialty: Cardiology Contact information: [phone] [address] [place] [address] [phone]                  Time coordinating discharge: 45 min  Signed:  Joseph Art DO  [facility] 08/17/2022, 10:26 AM

## 2022-08-17 NOTE — Care Management Obs Status (Signed)
MEDICARE OBSERVATION STATUS NOTIFICATION   Patient Details  Name: PHYLLISS STREGE MRN: [id number] Date of Birth: Mar 25, 1930   Medicare Observation Status Notification Given:  Yes    MahabirOlegario Messier, RN 08/17/2022, 11:14 AM

## 2022-08-17 NOTE — Discharge Instructions (Signed)
Continue flutter valve

## 2022-08-24 ENCOUNTER — Other Ambulatory Visit: Payer: Self-pay

## 2022-08-24 ENCOUNTER — Inpatient Hospital Stay (HOSPITAL_BASED_OUTPATIENT_CLINIC_OR_DEPARTMENT_OTHER)
Admission: EM | Admit: 2022-08-24 | Discharge: 2022-08-28 | DRG: 190 | Disposition: A | Discharge status: Home / Self Care | Payer: Medicare Other | Attending: Internal Medicine | Admitting: Internal Medicine

## 2022-08-24 ENCOUNTER — Inpatient Hospital Stay (HOSPITAL_COMMUNITY): Payer: Medicare Other

## 2022-08-24 ENCOUNTER — Emergency Department (HOSPITAL_BASED_OUTPATIENT_CLINIC_OR_DEPARTMENT_OTHER): Payer: Medicare Other

## 2022-08-24 ENCOUNTER — Encounter (HOSPITAL_BASED_OUTPATIENT_CLINIC_OR_DEPARTMENT_OTHER): Payer: Self-pay

## 2022-08-24 DIAGNOSIS — R918 Other nonspecific abnormal finding of lung field: Secondary | ICD-10-CM | POA: Diagnosis present

## 2022-08-24 DIAGNOSIS — Z823 Family history of stroke: Secondary | ICD-10-CM

## 2022-08-24 DIAGNOSIS — J189 Pneumonia, unspecified organism: Secondary | ICD-10-CM | POA: Diagnosis present

## 2022-08-24 DIAGNOSIS — I272 Pulmonary hypertension, unspecified: Secondary | ICD-10-CM | POA: Diagnosis present

## 2022-08-24 DIAGNOSIS — J440 Chronic obstructive pulmonary disease with acute lower respiratory infection: Secondary | ICD-10-CM | POA: Diagnosis not present

## 2022-08-24 DIAGNOSIS — Z7901 Long term (current) use of anticoagulants: Secondary | ICD-10-CM

## 2022-08-24 DIAGNOSIS — E7800 Pure hypercholesterolemia, unspecified: Secondary | ICD-10-CM | POA: Diagnosis present

## 2022-08-24 DIAGNOSIS — J441 Chronic obstructive pulmonary disease with (acute) exacerbation: Principal | ICD-10-CM | POA: Diagnosis present

## 2022-08-24 DIAGNOSIS — G8929 Other chronic pain: Secondary | ICD-10-CM | POA: Diagnosis present

## 2022-08-24 DIAGNOSIS — J9601 Acute respiratory failure with hypoxia: Secondary | ICD-10-CM | POA: Diagnosis not present

## 2022-08-24 DIAGNOSIS — I25118 Atherosclerotic heart disease of native coronary artery with other forms of angina pectoris: Secondary | ICD-10-CM | POA: Diagnosis present

## 2022-08-24 DIAGNOSIS — I480 Paroxysmal atrial fibrillation: Secondary | ICD-10-CM | POA: Diagnosis not present

## 2022-08-24 DIAGNOSIS — Z9841 Cataract extraction status, right eye: Secondary | ICD-10-CM

## 2022-08-24 DIAGNOSIS — R059 Cough, unspecified: Secondary | ICD-10-CM | POA: Diagnosis not present

## 2022-08-24 DIAGNOSIS — Z885 Allergy status to narcotic agent status: Secondary | ICD-10-CM

## 2022-08-24 DIAGNOSIS — N179 Acute kidney failure, unspecified: Secondary | ICD-10-CM | POA: Diagnosis not present

## 2022-08-24 DIAGNOSIS — Z825 Family history of asthma and other chronic lower respiratory diseases: Secondary | ICD-10-CM

## 2022-08-24 DIAGNOSIS — I739 Peripheral vascular disease, unspecified: Secondary | ICD-10-CM | POA: Diagnosis not present

## 2022-08-24 DIAGNOSIS — I10 Essential (primary) hypertension: Secondary | ICD-10-CM | POA: Diagnosis present

## 2022-08-24 DIAGNOSIS — Z9842 Cataract extraction status, left eye: Secondary | ICD-10-CM

## 2022-08-24 DIAGNOSIS — J449 Chronic obstructive pulmonary disease, unspecified: Secondary | ICD-10-CM | POA: Diagnosis not present

## 2022-08-24 DIAGNOSIS — J989 Respiratory disorder, unspecified: Secondary | ICD-10-CM | POA: Diagnosis not present

## 2022-08-24 DIAGNOSIS — Z7722 Contact with and (suspected) exposure to environmental tobacco smoke (acute) (chronic): Secondary | ICD-10-CM | POA: Diagnosis present

## 2022-08-24 DIAGNOSIS — Z20822 Contact with and (suspected) exposure to covid-19: Secondary | ICD-10-CM | POA: Diagnosis not present

## 2022-08-24 DIAGNOSIS — Z66 Do not resuscitate: Secondary | ICD-10-CM | POA: Diagnosis present

## 2022-08-24 DIAGNOSIS — N281 Cyst of kidney, acquired: Secondary | ICD-10-CM | POA: Diagnosis not present

## 2022-08-24 DIAGNOSIS — N1831 Chronic kidney disease, stage 3a: Secondary | ICD-10-CM

## 2022-08-24 DIAGNOSIS — Z955 Presence of coronary angioplasty implant and graft: Secondary | ICD-10-CM

## 2022-08-24 DIAGNOSIS — R0602 Shortness of breath: Secondary | ICD-10-CM | POA: Diagnosis not present

## 2022-08-24 DIAGNOSIS — Z90710 Acquired absence of both cervix and uterus: Secondary | ICD-10-CM

## 2022-08-24 DIAGNOSIS — I2510 Atherosclerotic heart disease of native coronary artery without angina pectoris: Secondary | ICD-10-CM | POA: Diagnosis present

## 2022-08-24 DIAGNOSIS — I5032 Chronic diastolic (congestive) heart failure: Secondary | ICD-10-CM | POA: Diagnosis not present

## 2022-08-24 DIAGNOSIS — I110 Hypertensive heart disease with heart failure: Secondary | ICD-10-CM | POA: Diagnosis present

## 2022-08-24 DIAGNOSIS — Z809 Family history of malignant neoplasm, unspecified: Secondary | ICD-10-CM

## 2022-08-24 DIAGNOSIS — I959 Hypotension, unspecified: Secondary | ICD-10-CM | POA: Diagnosis not present

## 2022-08-24 DIAGNOSIS — E785 Hyperlipidemia, unspecified: Secondary | ICD-10-CM | POA: Diagnosis present

## 2022-08-24 DIAGNOSIS — Z9049 Acquired absence of other specified parts of digestive tract: Secondary | ICD-10-CM | POA: Diagnosis not present

## 2022-08-24 DIAGNOSIS — M549 Dorsalgia, unspecified: Secondary | ICD-10-CM | POA: Diagnosis present

## 2022-08-24 DIAGNOSIS — M62838 Other muscle spasm: Secondary | ICD-10-CM | POA: Diagnosis present

## 2022-08-24 DIAGNOSIS — Z888 Allergy status to other drugs, medicaments and biological substances status: Secondary | ICD-10-CM

## 2022-08-24 DIAGNOSIS — Z9981 Dependence on supplemental oxygen: Secondary | ICD-10-CM

## 2022-08-24 LAB — RESPIRATORY PANEL BY PCR

## 2022-08-24 LAB — CBC WITH DIFFERENTIAL/PLATELET
Abs Immature Granulocytes: 0.2 10*3/uL — ABNORMAL HIGH (ref 0.00–0.07)
Basophils Absolute: 0 10*3/uL (ref 0.0–0.1)
Basophils Relative: 0 %
Eosinophils Absolute: 0.1 10*3/uL (ref 0.0–0.5)
Eosinophils Relative: 0 %
HCT: 40.1 % (ref 36.0–46.0)
Hemoglobin: 13.1 g/dL (ref 12.0–15.0)
Immature Granulocytes: 1 %
Lymphocytes Relative: 14 %
Lymphs Abs: 2.6 10*3/uL (ref 0.7–4.0)
MCH: 33.5 pg (ref 26.0–34.0)
MCHC: 32.7 g/dL (ref 30.0–36.0)
MCV: 102.6 fL — ABNORMAL HIGH (ref 80.0–100.0)
Monocytes Absolute: 0.9 10*3/uL (ref 0.1–1.0)
Monocytes Relative: 5 %
Neutro Abs: 14.3 10*3/uL — ABNORMAL HIGH (ref 1.7–7.7)
Neutrophils Relative %: 80 %
Platelets: 313 10*3/uL (ref 150–400)
RBC: 3.91 MIL/uL (ref 3.87–5.11)
RDW: 13.2 % (ref 11.5–15.5)
WBC: 18 10*3/uL — ABNORMAL HIGH (ref 4.0–10.5)
nRBC: 0 % (ref 0.0–0.2)

## 2022-08-24 LAB — BASIC METABOLIC PANEL
Anion gap: 14 (ref 5–15)
BUN: 43 mg/dL — ABNORMAL HIGH (ref 8–23)
CO2: 25 mmol/L (ref 22–32)
Calcium: 10 mg/dL (ref 8.9–10.3)
Chloride: 104 mmol/L (ref 98–111)
Creatinine, Ser: 1.22 mg/dL — ABNORMAL HIGH (ref 0.44–1.00)
GFR, Estimated: 42 mL/min — ABNORMAL LOW (ref >60)
Glucose, Bld: 109 mg/dL — ABNORMAL HIGH (ref 70–99)
Potassium: 3.8 mmol/L (ref 3.5–5.1)
Sodium: 143 mmol/L (ref 135–145)

## 2022-08-24 LAB — SARS CORONAVIRUS 2 BY RT PCR: SARS Coronavirus 2 by RT PCR: NEGATIVE

## 2022-08-24 MED ORDER — METHYLPREDNISOLONE SODIUM SUCC 40 MG IJ SOLR
40.0000 mg | Freq: Two times a day (BID) | INTRAMUSCULAR | Status: AC
  Administered 2022-08-25 (×2): 40 mg via INTRAVENOUS
  Filled 2022-08-24 (×2): qty 1

## 2022-08-24 MED ORDER — EZETIMIBE 10 MG PO TABS
10.0000 mg | ORAL_TABLET | Freq: Every day | ORAL | Status: DC
  Administered 2022-08-25 – 2022-08-28 (×4): 10 mg via ORAL
  Filled 2022-08-24 (×4): qty 1

## 2022-08-24 MED ORDER — ACETAMINOPHEN 650 MG RE SUPP: 650.0000 mg | Freq: Four times a day (QID) | RECTAL | Status: DC | PRN

## 2022-08-24 MED ORDER — IPRATROPIUM-ALBUTEROL 0.5-2.5 (3) MG/3ML IN SOLN
3.0000 mL | RESPIRATORY_TRACT | Status: DC
  Administered 2022-08-24 – 2022-08-27 (×17): 3 mL via RESPIRATORY_TRACT
  Filled 2022-08-24 (×17): qty 3

## 2022-08-24 MED ORDER — SODIUM CHLORIDE 0.9 % IV SOLN: INTRAVENOUS | Status: DC

## 2022-08-24 MED ORDER — ALBUTEROL SULFATE (2.5 MG/3ML) 0.083% IN NEBU
10.0000 mg/h | INHALATION_SOLUTION | Freq: Once | RESPIRATORY_TRACT | Status: AC
  Administered 2022-08-24: 10 mg/h via RESPIRATORY_TRACT

## 2022-08-24 MED ORDER — TIZANIDINE HCL 4 MG PO TABS
4.0000 mg | ORAL_TABLET | Freq: Once | ORAL | Status: AC
  Administered 2022-08-24: 4 mg via ORAL
  Filled 2022-08-24: qty 1

## 2022-08-24 MED ORDER — GUAIFENESIN ER 600 MG PO TB12
600.0000 mg | ORAL_TABLET | Freq: Two times a day (BID) | ORAL | Status: DC
  Administered 2022-08-24 – 2022-08-25 (×2): 600 mg via ORAL
  Filled 2022-08-24 (×2): qty 1

## 2022-08-24 MED ORDER — METOPROLOL SUCCINATE ER 25 MG PO TB24: 25.0000 mg | ORAL_TABLET | Freq: Every day | ORAL | Status: DC

## 2022-08-24 MED ORDER — ALBUTEROL SULFATE (2.5 MG/3ML) 0.083% IN NEBU
INHALATION_SOLUTION | RESPIRATORY_TRACT | Status: AC
  Filled 2022-08-24: qty 12

## 2022-08-24 MED ORDER — PREDNISONE 20 MG PO TABS
40.0000 mg | ORAL_TABLET | Freq: Every day | ORAL | Status: DC
  Administered 2022-08-26 – 2022-08-28 (×3): 40 mg via ORAL
  Filled 2022-08-24 (×3): qty 2

## 2022-08-24 MED ORDER — APIXABAN 2.5 MG PO TABS
2.5000 mg | ORAL_TABLET | Freq: Two times a day (BID) | ORAL | Status: DC
  Administered 2022-08-24 – 2022-08-28 (×8): 2.5 mg via ORAL
  Filled 2022-08-24 (×8): qty 1

## 2022-08-24 MED ORDER — ACETAMINOPHEN 325 MG PO TABS
650.0000 mg | ORAL_TABLET | Freq: Four times a day (QID) | ORAL | Status: DC | PRN
  Administered 2022-08-25: 650 mg via ORAL
  Filled 2022-08-24: qty 2

## 2022-08-24 MED ORDER — IPRATROPIUM BROMIDE HFA 17 MCG/ACT IN AERS: 2.0000 | INHALATION_SPRAY | Freq: Four times a day (QID) | RESPIRATORY_TRACT | Status: DC

## 2022-08-24 MED ORDER — ALBUTEROL SULFATE (2.5 MG/3ML) 0.083% IN NEBU
INHALATION_SOLUTION | RESPIRATORY_TRACT | Status: AC
  Administered 2022-08-24: 5 mg
  Filled 2022-08-24: qty 6

## 2022-08-24 MED ORDER — ALBUTEROL SULFATE HFA 108 (90 BASE) MCG/ACT IN AERS: 4.0000 | INHALATION_SPRAY | RESPIRATORY_TRACT | Status: DC

## 2022-08-24 MED ORDER — METHYLPREDNISOLONE SODIUM SUCC 125 MG IJ SOLR
125.0000 mg | Freq: Once | INTRAMUSCULAR | Status: AC
  Administered 2022-08-24: 125 mg via INTRAVENOUS
  Filled 2022-08-24: qty 2

## 2022-08-24 NOTE — ED Provider Notes (Addendum)
[facility]   CSN: [id number] Arrival date & time: 08/24/22  1124     History  Chief Complaint  Patient presents with   Shortness of Breath    Sydney Gross is a 88 y.o. female.  Patient here for shortness of breath.  History of high cholesterol, hypertension, COPD, pulmonary hypertension.  Him for the last several days.  Had a negative COVID test recently.  Home nebulizer helping some but still felt more short of breath and tight this morning.  Denies any chest pain, nausea, vomiting, abdominal pain.  Has had some sputum production.  Nothing makes it worse or better.  Denies any weakness or chills.  The history is provided by the patient.       Home Medications Prior to Admission medications   Medication Sig Start Date End Date Taking? Authorizing Provider  acetaminophen (TYLENOL) 650 MG CR tablet Take 1,300 mg by mouth every 8 (eight) hours as needed for pain.    [provider]  albuterol (PROVENTIL) (2.5 MG/3ML) 0.083% nebulizer solution Take 3 mLs (2.5 mg total) by nebulization every 2 (two) hours as needed for wheezing or shortness of breath. 08/17/22   Joseph ArtVann, Jessica U, DO  albuterol (VENTOLIN HFA) 108 (90 Base) MCG/ACT inhaler Inhale 1 puff into the lungs every 6 (six) hours as needed for shortness of breath. 08/11/22   [provider]  apixaban (ELIQUIS) 2.5 MG TABS tablet Take 1 tablet (2.5 mg total) by mouth 2 (two) times daily. 06/18/22   Jonita AlbeeJohnson, Kathleen R, PA-C  Budeson-Glycopyrrol-Formoterol (BREZTRI AEROSPHERE) 160-9-4.8 MCG/ACT AERO Inhale 2 puffs into the lungs 2 (two) times daily as needed (Shortness of breath/ COPD). 11/15/19   [provider]  denosumab (PROLIA) 60 MG/ML SOSY injection Inject 60 mg as directed every 6 (six) months.    [provider]  diphenhydramine-acetaminophen (TYLENOL PM) 25-500 MG TABS tablet Take 1 tablet by mouth at bedtime.    [provider]   Evolocumab (REPATHA SURECLICK) 140 MG/ML SOAJ INJECT 1 PEN INTO THE SKIN EVERY 14 DAYS Patient taking differently: Inject 140 mg into the skin every 14 (fourteen) days. 07/19/22   Lyn RecordsSmith, Henry W, MD  ezetimibe (ZETIA) 10 MG tablet Take 1 tablet by mouth once daily Patient taking differently: Take 10 mg by mouth daily. 03/24/22   Lyn RecordsSmith, Henry W, MD  guaiFENesin (MUCINEX) 600 MG 12 hr tablet Take 1 tablet (600 mg total) by mouth 2 (two) times daily as needed for cough or to loosen phlegm. 08/17/22   Joseph ArtVann, Jessica U, DO  ipratropium-albuterol (DUONEB) 0.5-2.5 (3) MG/3ML SOLN Take 3 mLs by nebulization 3 (three) times daily. 08/17/22   Joseph ArtVann, Jessica U, DO  lisinopril-hydrochlorothiazide (ZESTORETIC) 20-25 MG tablet Take 0.5 tablets by mouth in the morning. Patient taking differently: Take 0.5 tablets by mouth daily. 06/18/22   Jonita AlbeeJohnson, Kathleen R, PA-C  Magnesium Cl-Calcium Carbonate (SLOW-MAG PO) Take 1 tablet by mouth 2 (two) times daily.    [provider]  metoprolol succinate (TOPROL-XL) 25 MG 24 hr tablet Take 1 tablet (25 mg total) by mouth daily. Pt needs to make appt to see provider for further refills - 2nd attempt 06/18/22   Jonita AlbeeJohnson, Kathleen R, PA-C  metoprolol tartrate (LOPRESSOR) 25 MG tablet Take one half - one tablet (12.5 mg - 25 mg) as needed for Heart Rate 100 or above. Patient taking differently: Take 12.5 mg by mouth daily as needed (For heart rate 100 or above). 08/05/22  Swinyer, Lanice Schwab, NP  nitroGLYCERIN (NITROSTAT) 0.4 MG SL tablet Place 0.4 mg under the tongue every 5 (five) minutes as needed for chest pain.    [provider]  tiZANidine (ZANAFLEX) 4 MG capsule Take 4 mg by mouth daily as needed for muscle spasms.    [provider]      Allergies    Brilinta [ticagrelor], Codeine, Darvon [propoxyphene], Lipitor [atorvastatin], Rosuvastatin, Simvastatin, and Tramadol hcl    Review of Systems   Review of Systems  Physical Exam Updated Vital  Signs BP 117/65 (BP Location: Right Arm)   Pulse 81   Temp (!) 97.5 F (36.4 C) (Oral)   Resp (!) 24   Ht 4\' 11"  (1.499 m)   Wt 48 kg   SpO2 94%   BMI 21.37 kg/m  Physical Exam Vitals and nursing note reviewed.  Constitutional:      General: She is not in acute distress.    Appearance: She is well-developed. She is not ill-appearing.  HENT:     Head: Normocephalic and atraumatic.  Eyes:     Conjunctiva/sclera: Conjunctivae normal.     Pupils: Pupils are equal, round, and reactive to light.  Cardiovascular:     Rate and Rhythm: Normal rate and regular rhythm.     Heart sounds: No murmur heard. Pulmonary:     Effort: Tachypnea present. No respiratory distress.     Breath sounds: Wheezing present.  Abdominal:     Palpations: Abdomen is soft.     Tenderness: There is no abdominal tenderness.  Musculoskeletal:        General: No swelling.     Cervical back: Normal range of motion and neck supple.     Right lower leg: No edema.     Left lower leg: No edema.  Skin:    General: Skin is warm and dry.     Capillary Refill: Capillary refill takes less than 2 seconds.  Neurological:     Mental Status: She is alert.  Psychiatric:        Mood and Affect: Mood normal.     ED Results / Procedures / Treatments   Labs (all labs ordered are listed, but only abnormal results are displayed) Labs Reviewed  CBC WITH DIFFERENTIAL/PLATELET - Abnormal; Notable for the following components:      Result Value   WBC 18.0 (*)    MCV 102.6 (*)    Neutro Abs 14.3 (*)    Abs Immature Granulocytes 0.20 (*)    All other components within normal limits  BASIC METABOLIC PANEL - Abnormal; Notable for the following components:   Glucose, Bld 109 (*)    BUN 43 (*)    Creatinine, Ser 1.22 (*)    GFR, Estimated 42 (*)    All other components within normal limits  SARS CORONAVIRUS 2 BY RT PCR    EKG EKG Interpretation  Date/Time:  Tuesday August 24 2022 11:42:27 EDT Ventricular Rate:   72 PR Interval:  111 QRS Duration: 80 QT Interval:  379 QTC Calculation: 415 R Axis:   46 Text Interpretation: Sinus rhythm Ventricular premature complex Borderline short PR interval Confirmed by Lennice Sites ([id number]) on 08/24/2022 11:48:44 AM  Radiology DG Chest Portable 1 View  Result Date: 08/24/2022 CLINICAL DATA:  Shortness of breath and productive cough, COPD EXAM: PORTABLE CHEST 1 VIEW COMPARISON:  08/16/2022 FINDINGS: The heart size and mediastinal contours are unchanged. Aortic atherosclerotic disease. No focal pulmonary opacity. No pleural effusion or pneumothorax. Scoliosis.  No acute osseous abnormality. IMPRESSION: No acute cardiopulmonary process. Electronically Signed   By: Merilyn Baba M.D.   On: 08/24/2022 12:08    Procedures .Critical Care  Performed by: Lennice Sites, DO Authorized by: Lennice Sites, DO   Critical care provider statement:    Critical care time (minutes):  35   Critical care was necessary to treat or prevent imminent or life-threatening deterioration of the following conditions:  Respiratory failure   Critical care was time spent personally by me on the following activities:  Blood draw for specimens, development of treatment plan with patient or surrogate, discussions with primary provider, evaluation of patient's response to treatment, examination of patient, obtaining history from patient or surrogate, ordering and performing treatments and interventions, ordering and review of laboratory studies and ordering and review of radiographic studies   Care discussed with: admitting provider       Medications Ordered in ED Medications  albuterol (PROVENTIL) (2.5 MG/3ML) 0.083% nebulizer solution (has no administration in time range)  albuterol (VENTOLIN HFA) 108 (90 Base) MCG/ACT inhaler 4 puff (has no administration in time range)  ipratropium (ATROVENT HFA) inhaler 2 puff (has no administration in time range)  albuterol (PROVENTIL) (2.5 MG/3ML) 0.083%  nebulizer solution (5 mg  Given 08/24/22 1139)  methylPREDNISolone sodium succinate (SOLU-MEDROL) 125 mg/2 mL injection 125 mg (125 mg Intravenous Given 08/24/22 1201)  albuterol (PROVENTIL) (2.5 MG/3ML) 0.083% nebulizer solution (10 mg/hr Nebulization Given 08/24/22 1321)    ED Course/ Medical Decision Making/ A&P                           Medical Decision Making Amount and/or Complexity of Data Reviewed Labs: ordered. Radiology: ordered.  Risk Prescription drug management. Decision regarding hospitalization.   Sydney Gross is here with shortness of breath.  History of COPD, hypertension, high cholesterol.  Wheezing throughout on exam.  Differential diagnosis is likely COPD exacerbation in the setting of viral process/weather changes.  I have no concern for ACS or PE.  Could be pneumonia.  We will check CBC, BMP, EKG, chest x-ray.  Will give DuoNebs, IV steroids and reevaluate.  Patient with normal vitals.  Oxygen is 94%.  Per my review and interpretation of EKG, sinus rhythm.  No ischemic changes.  Per my review and interpretation of chest x-ray there is no pneumonia or pneumothorax.  COVID test is negative.  Patient does have mild leukocytosis of 18 but otherwise unremarkable lab work.  She still fairly tachypneic and tight after breathing treatment.  While her room air oxygenation is in the low 90s I still think she benefit from observation stay given her increased work of breathing and ongoing symptoms.  We will give further breathing treatment.  Can hold on BiPAP at this time.  After multiple breathing treatments she still fairly tachypneic.  We will place her on BiPAP.  Admitted to medicine.  This chart was dictated using voice recognition software.  Despite best efforts to proofread,  errors can occur which can change the documentation meaning.     Final Clinical Impression(s) / ED Diagnoses Final diagnoses:  COPD exacerbation [facility])    Rx / DC Orders ED Discharge Orders      None         Lennice Sites, DO 08/24/22 Gibson, Gary, DO 08/24/22 1437

## 2022-08-24 NOTE — H&P (Signed)
History and Physical    Patient: Sydney Gross DOB: May 14, 1930 DOA: 08/24/2022 DOS: the patient was seen and examined on 08/24/2022 PCP: Lupita RaiderShaw, Kimberlee, MD  Patient coming from: Home  Chief Complaint:  Chief Complaint  Patient presents with   Shortness of Breath   HPI: Sydney Loatricia D Hegstrom is a 88 y.o. female with medical history significant of COPD, afib on eliquis, HTN, chronic diastolic HF. Presenting with shortness of breath. Recently admitted for the same. She reports that she's had 2 weeks of shortness of breath and cough. She was  admitted to the hospital for an ON stay on 9/11. At that time she was Dx'd w/ COPD exacerbation and sent home on steroids. She reports that since being home, her cough has not completely gone away. She is able to use her nebulizer for help, but it only lasts a short time. She hasn't had any chest pain or palpitations. She hasn't had any fevers. When her symptoms did not completely resolve, she decided to come back to the ED for evaluation. She denies any other aggravating or alleviating factors.   Review of Systems: As mentioned in the history of present illness. All other systems reviewed and are negative. Past Medical History:  Diagnosis Date   Allergic rhinitis    Arthritis    "maybe a little in my knees and back" (07/17/2018)   Chronic lower back pain    Colitis 12/2013   "never had this before"   COPD (chronic obstructive pulmonary disease) (HCC)    "questionable/Dr. Katrinka BlazingSmith" (07/17/2018)   Coronary artery disease    GI bleed    H/O calcium pyrophosphate deposition disease (CPPD)    Hiatal hernia    Hyperlipidemia    Hypertension    Mild mitral regurgitation    On home oxygen therapy    "I've used it 2 times in 2 yrs" (07/17/2018)   PAD (peripheral artery disease) (HCC)    PAF (paroxysmal atrial fibrillation) (HCC)    Pulmonary hypertension (HCC)    Shingles    Past Surgical History:  Procedure Laterality Date   ABDOMINAL  AORTOGRAM W/LOWER EXTREMITY N/A 07/10/2021   Procedure: ABDOMINAL AORTOGRAM W/LOWER EXTREMITY;  Surgeon: Sherren KernsFields, Charles E, MD;  Location: [facility];  Service: Cardiovascular;  Laterality: N/A;   ABDOMINAL HYSTERECTOMY  1960's   APPENDECTOMY     BACK SURGERY     BIOPSY  04/20/2021   Procedure: BIOPSY;  Surgeon: Jeani HawkingHung, Patrick, MD;  Location: [facility];  Service: Endoscopy;;   BLEPHAROPLASTY Bilateral 05/2017   CARDIAC CATHETERIZATION     CATARACT EXTRACTION, BILATERAL Bilateral    CESAREAN SECTION  1957; 1960   CORONARY STENT INTERVENTION N/A 07/17/2018   Procedure: CORONARY STENT INTERVENTION;  Surgeon: Lyn RecordsSmith, Henry W, MD;  Location: [facility];  Service: Cardiovascular;  Laterality: N/A;   DACROCYSTORHINOSTOMY W/ JONES TUBE Bilateral 05/2017   "put tear duct in my eyes"   ESOPHAGOGASTRODUODENOSCOPY (EGD) WITH PROPOFOL N/A 04/20/2021   Procedure: ESOPHAGOGASTRODUODENOSCOPY (EGD) WITH PROPOFOL;  Surgeon: Jeani HawkingHung, Patrick, MD;  Location: [facility];  Service: Endoscopy;  Laterality: N/A;   EYE SURGERY     LAPAROSCOPIC CHOLECYSTECTOMY  ~ 2009   LUMBAR DISC SURGERY  ?2004   PERIPHERAL VASCULAR INTERVENTION Left 07/10/2021   Procedure: PERIPHERAL VASCULAR INTERVENTION;  Surgeon: Sherren KernsFields, Charles E, MD;  [facility];  Service: Cardiovascular;  Laterality: Left;  common, external iliac arteries   RIGHT/LEFT HEART CATH AND CORONARY ANGIOGRAPHY N/A 07/17/2018  Procedure: RIGHT/LEFT HEART CATH AND CORONARY ANGIOGRAPHY;  Surgeon: Lyn Records, MD;  Location: [facility] INVASIVE CV LAB;  Service: Cardiovascular;  Laterality: N/A;   TONSILLECTOMY AND ADENOIDECTOMY  ?1940's   Social History:  reports that she has never smoked. She has been exposed to tobacco smoke. She has never used smokeless tobacco. She reports current alcohol use of about 4.0 standard drinks of alcohol per week. She reports that she does not use drugs.  Allergies  Allergen Reactions   Brilinta [Ticagrelor]  Shortness Of Breath   Codeine Nausea And Vomiting   Darvon [Propoxyphene] Nausea Only   Lipitor [Atorvastatin] Other (See Comments)    MYALGIAS   Rosuvastatin Other (See Comments)    MYALGIAS    Simvastatin Other (See Comments)    MYALGIAS    Tramadol Hcl Nausea Only    nausea (08/2019)    Family History  Problem Relation Age of Onset   Emphysema Father 48   CVA Mother    Cancer Brother        ? type    Prior to Admission medications   Medication Sig Start Date End Date Taking? Authorizing Provider  acetaminophen (TYLENOL) 650 MG CR tablet Take 1,300 mg by mouth every 8 (eight) hours as needed for pain.   Yes [provider]  albuterol (PROVENTIL) (2.5 MG/3ML) 0.083% nebulizer solution Take 3 mLs (2.5 mg total) by nebulization every 2 (two) hours as needed for wheezing or shortness of breath. 08/17/22  Yes Vann, Jessica U, DO  albuterol (VENTOLIN HFA) 108 (90 Base) MCG/ACT inhaler Inhale 1 puff into the lungs every 6 (six) hours as needed for shortness of breath. 08/11/22  Yes [provider]  Budeson-Glycopyrrol-Formoterol (BREZTRI AEROSPHERE) 160-9-4.8 MCG/ACT AERO Inhale 2 puffs into the lungs 2 (two) times daily as needed (Shortness of breath/ COPD). 11/15/19  Yes [provider]  denosumab (PROLIA) 60 MG/ML SOSY injection Inject 60 mg as directed every 6 (six) months.   Yes [provider]  diphenhydramine-acetaminophen (TYLENOL PM) 25-500 MG TABS tablet Take 1 tablet by mouth at bedtime.   Yes [provider]  Evolocumab (REPATHA SURECLICK) 140 MG/ML SOAJ INJECT 1 PEN INTO THE SKIN EVERY 14 DAYS Patient taking differently: Inject 140 mg into the skin every 14 (fourteen) days. 07/19/22  Yes Lyn Records, MD  ezetimibe (ZETIA) 10 MG tablet Take 1 tablet by mouth once daily Patient taking differently: Take 10 mg by mouth daily. 03/24/22  Yes Lyn Records, MD  ipratropium-albuterol (DUONEB) 0.5-2.5 (3) MG/3ML SOLN Take 3 mLs by  nebulization 3 (three) times daily. 08/17/22  Yes Vann, Jessica U, DO  lisinopril-hydrochlorothiazide (ZESTORETIC) 20-25 MG tablet Take 0.5 tablets by mouth in the morning. Patient taking differently: Take 0.5 tablets by mouth daily. 06/18/22  Yes Jonita Albee, PA-C  Magnesium Cl-Calcium Carbonate (SLOW-MAG PO) Take 1 tablet by mouth 2 (two) times daily.   Yes [provider]  metoprolol succinate (TOPROL-XL) 25 MG 24 hr tablet Take 1 tablet (25 mg total) by mouth daily. Pt needs to make appt to see provider for further refills - 2nd attempt 06/18/22  Yes Jonita Albee, PA-C  metoprolol tartrate (LOPRESSOR) 25 MG tablet Take one half - one tablet (12.5 mg - 25 mg) as needed for Heart Rate 100 or above. Patient taking differently: Take 12.5 mg by mouth daily as needed (For heart rate 100 or above). 08/05/22  Yes Swinyer, Zachary George, NP  apixaban (ELIQUIS) 2.5 MG TABS tablet Take  1 tablet (2.5 mg total) by mouth 2 (two) times daily. 06/18/22   Margie Billet, PA-C  guaiFENesin (MUCINEX) 600 MG 12 hr tablet Take 1 tablet (600 mg total) by mouth 2 (two) times daily as needed for cough or to loosen phlegm. Patient not taking: Reported on 08/24/2022 08/17/22   Geradine Girt, DO  nitroGLYCERIN (NITROSTAT) 0.4 MG SL tablet Place 0.4 mg under the tongue every 5 (five) minutes as needed for chest pain.    [provider]  tiZANidine (ZANAFLEX) 4 MG capsule Take 4 mg by mouth daily as needed for muscle spasms.    [provider]    Physical Exam: Vitals:   08/24/22 1530 08/24/22 1542 08/24/22 1600 08/24/22 1621  BP: (!) 158/80  138/76   Pulse: 94  94   Resp: (!) 21  12   Temp:  97.6 F (36.4 C)  98.8 F (37.1 C)  TempSrc:  Axillary  Oral  SpO2: 99%  99% 100%  Weight:      Height:       General: 88 y.o. female resting in chair in NAD Eyes: PERRL, normal sclera ENMT: Nares patent w/o discharge, orophaynx clear, dentition normal, ears w/o  discharge/lesions/ulcers Neck: Supple, trachea midline Cardiovascular: RRR, +S1, S2, no m/g/r, equal pulses throughout Respiratory: scattered expiratory wheeze, no r/r, normal WOB on 2L  GI: BS+, NDNT, no masses noted, no organomegaly noted MSK: No e/c/c Neuro: A&O x 3, no focal deficits Psyc: Appropriate interaction and affect, calm/cooperative  Data Reviewed:  Results for orders placed or performed during the hospital encounter of 08/24/22 (from the past 24 hour(s))  CBC with Differential     Status: Abnormal   Collection Time: 08/24/22 11:50 AM  Result Value Ref Range   WBC 18.0 (H) 4.0 - 10.5 K/uL   RBC 3.91 3.87 - 5.11 MIL/uL   Hemoglobin 13.1 12.0 - 15.0 g/dL   HCT 40.1 36.0 - 46.0 %   MCV 102.6 (H) 80.0 - 100.0 fL   MCH 33.5 26.0 - 34.0 pg   MCHC 32.7 30.0 - 36.0 g/dL   RDW 13.2 11.5 - 15.5 %   Platelets 313 150 - 400 K/uL   nRBC 0.0 0.0 - 0.2 %   Neutrophils Relative % 80 %   Neutro Abs 14.3 (H) 1.7 - 7.7 K/uL   Lymphocytes Relative 14 %   Lymphs Abs 2.6 0.7 - 4.0 K/uL   Monocytes Relative 5 %   Monocytes Absolute 0.9 0.1 - 1.0 K/uL   Eosinophils Relative 0 %   Eosinophils Absolute 0.1 0.0 - 0.5 K/uL   Basophils Relative 0 %   Basophils Absolute 0.0 0.0 - 0.1 K/uL   Immature Granulocytes 1 %   Abs Immature Granulocytes 0.20 (H) 0.00 - 0.07 K/uL  Basic metabolic panel     Status: Abnormal   Collection Time: 08/24/22 11:50 AM  Result Value Ref Range   Sodium 143 135 - 145 mmol/L   Potassium 3.8 3.5 - 5.1 mmol/L   Chloride 104 98 - 111 mmol/L   CO2 25 22 - 32 mmol/L   Glucose, Bld 109 (H) 70 - 99 mg/dL   BUN 43 (H) 8 - 23 mg/dL   Creatinine, Ser 1.22 (H) 0.44 - 1.00 mg/dL   Calcium 10.0 8.9 - 10.3 mg/dL   GFR, Estimated 42 (L) >60 mL/min   Anion gap 14 5 - 15  SARS Coronavirus 2 by RT PCR (hospital order, performed in [facility] hospital lab) *  cepheid single result test* Anterior Nasal Swab     Status: None   Collection Time: 08/24/22 11:50 AM   Specimen:  Anterior Nasal Swab  Result Value Ref Range   SARS Coronavirus 2 by RT PCR NEGATIVE NEGATIVE   CXR: No acute cardiopulmonary process.  Assessment and Plan: COPD exacerbation     - admitted to inpt, progress d/t initially being on BiPap; but that has been weaned to 2L      - continue steroids, duonebs     - check CT chest     - COVID negative; check RVP  AKI     - gentle fluids     - watch nephrotoxins     - renal US  PAF on eliquis     - continue home regimen when confirmed  Chronic diastolic HF     - hold diuretic today; resume in AM  HLD CAD     - continue home regimen when confirmed  Advance Care Planning:   Code Status: DNR  Consults: None  Family Communication: None at bedside  Severity of Illness: The appropriate patient status for this patient is INPATIENT. Inpatient status is judged to be reasonable and necessary in order to provide the required intensity of service to ensure the patient's safety. The patient's presenting symptoms, physical exam findings, and initial radiographic and laboratory data in the context of their chronic comorbidities is felt to place them at high risk for further clinical deterioration. Furthermore, it is not anticipated that the patient will be medically stable for discharge from the hospital within 2 midnights of admission.   * I certify that at the point of admission it is my clinical judgment that the patient will require inpatient hospital care spanning beyond 2 midnights from the point of admission due to high intensity of service, high risk for further deterioration and high frequency of surveillance required.*  Time spent in coordination of this H&P: 45 minutes   Author: Teddy Spike, DO 08/24/2022 4:51 PM  For on call review [email or web address].

## 2022-08-24 NOTE — ED Notes (Signed)
Care Handoff/Patient Report given to RN on Floor.

## 2022-08-24 NOTE — Progress Notes (Addendum)
Plan of Care Note for accepted transfer   Patient: Sydney Gross MRN: [id number]   [place]: 08/24/2022  Facility requesting transfer: [facility] Requesting Provider: Ronnald Nian Reason for transfer: COPD exacerbation  Facility course: Patient with h/o chronic back pain, COPD, chronic diastolic CHF, CAD, HTN, HLD, afib, and PAD presenting with SOB.  She was hospitalized overnight last week for the same, treated for COPD exacerbation.  Presented with COPD exacerbation, SOB for several days. CXR ok.  Persistent tachypnea despite treatments.  She is doing a trial of BIPAP and so will need progressive at this time.   Plan of care: The patient is accepted for admission to Progressive Care unit, at [facility] or [facility].    Author: Karmen Bongo, MD 08/24/2022  Check [email or web address] for on-call coverage.  Nursing staff, Please call [place] number on Amion as soon as patient's arrival, so appropriate admitting provider can evaluate the pt.

## 2022-08-24 NOTE — ED Notes (Signed)
Care Handoff/Patient Report given to [facility] at this Time.

## 2022-08-24 NOTE — Progress Notes (Signed)
Patient and family with concerns about IVF with patient's SOB and CHF history.  Referred to on call NP and she stated that IVF already ordered at 49ml/hr and to update I&Os.  Referred to pt and family and will initiate IVF at ordered rate and monitor for any s/s of overload or increased SOB.

## 2022-08-24 NOTE — Progress Notes (Signed)
Patient here from [facility]. Patient up in chair in no distress on 2 Lpm nasal cannula. HR 95, RR 18, SATs 96%. BiPAP left if room if needed at later time. RN aware.

## 2022-08-24 NOTE — Progress Notes (Deleted)
Synopsis: Referred in September 2023 for presumed COPD after hospitalization earlier that month for a COPD exacerbation in the setting of a viral infection.  COVID testing was negative.  Subjective:   PATIENT ID: Sydney Gross GENDER: female DOB: 1930/04/23, MRN: [id number]   HPI  No chief complaint on file.   *** Record review: The patient was hospitalized in September 2023 for presumed COPD exacerbation due to a virus.  COVID testing was negative.  She was treated with IV steroids and bronchodilators.  Past Medical History:  Diagnosis Date   Allergic rhinitis    Arthritis    "maybe a little in my knees and back" (07/17/2018)   Chronic lower back pain    Colitis 12/2013   "never had this before"   COPD (chronic obstructive pulmonary disease) ([place])    "questionable/Dr. Tamala Julian" (07/17/2018)   Coronary artery disease    Family history of anesthesia complication    "mother; think they gave her too much; c/o pain after OR; gave her more RX; they had to bring her back real quick"   GI bleed    H/O calcium pyrophosphate deposition disease (CPPD)    Heart murmur    Hiatal hernia    Hyperlipidemia    Hypertension    Mild mitral regurgitation    Numbness in feet    On home oxygen therapy    "I've used it 2 times in 2 yrs" (07/17/2018)   PAD (peripheral artery disease) (HCC)    PAF (paroxysmal atrial fibrillation) (HCC)    Proteinuria    Pulmonary hypertension ([place])    Shingles    Stenosis of tear duct      Family History  Problem Relation Age of Onset   Emphysema Father 76   CVA Mother    Cancer Brother        ? type     Social History   Socioeconomic History   Marital status: Married    Spouse name: Meda Coffee   Number of children: Not on file   Years of education: 12   Highest education level: High school graduate  Occupational History   Occupation: Retired  Tobacco Use   Smoking status: Never    Passive exposure: Past   Smokeless tobacco: Never  Vaping Use    Vaping Use: Never used  Substance and Sexual Activity   Alcohol use: Yes    Alcohol/week: 4.0 standard drinks of alcohol    Types: 4 Glasses of wine per week    Comment: glass of wine every other night   Drug use: Never   Sexual activity: Not Currently  Other Topics Concern   Not on file  Social History Narrative   Not on file   Social Determinants of Health   Financial Resource Strain: Low Risk  (08/29/2018)   Overall Financial Resource Strain (CARDIA)    Difficulty of Paying Living Expenses: Not very hard  Food Insecurity: No Food Insecurity (08/16/2022)   Hunger Vital Sign    Worried About Running Out of Food in the Last Year: Never true    Ran Out of Food in the Last Year: Never true  Transportation Needs: No Transportation Needs (08/16/2022)   PRAPARE - Hydrologist (Medical): No    Lack of Transportation (Non-Medical): No  Physical Activity: Inactive (08/29/2018)   Exercise Vital Sign    Days of Exercise per Week: 0 days    Minutes of Exercise per Session: 0 min  Stress: No  Stress Concern Present (08/29/2018)   [employer] of Occupational Health - Occupational Stress Questionnaire    Feeling of Stress : Not at all  Social Connections: Not on file  Intimate Partner Violence: Not At Risk (08/16/2022)   Humiliation, Afraid, Rape, and Kick questionnaire    Fear of Current or Ex-Partner: No    Emotionally Abused: No    Physically Abused: No    Sexually Abused: No     Allergies  Allergen Reactions   Brilinta [Ticagrelor] Shortness Of Breath   Codeine Nausea And Vomiting   Darvon [Propoxyphene] Nausea Only   Lipitor [Atorvastatin] Other (See Comments)    MYALGIAS   Rosuvastatin Other (See Comments)    MYALGIAS    Simvastatin Other (See Comments)    MYALGIAS    Tramadol Hcl Nausea Only    nausea (08/2019)     Outpatient Medications Prior to Visit  Medication Sig Dispense Refill   acetaminophen (TYLENOL) 650 MG CR tablet Take  1,300 mg by mouth every 8 (eight) hours as needed for pain.     albuterol (PROVENTIL) (2.5 MG/3ML) 0.083% nebulizer solution Take 3 mLs (2.5 mg total) by nebulization every 2 (two) hours as needed for wheezing or shortness of breath. 75 mL 12   albuterol (VENTOLIN HFA) 108 (90 Base) MCG/ACT inhaler Inhale 1 puff into the lungs every 6 (six) hours as needed for shortness of breath.     apixaban (ELIQUIS) 2.5 MG TABS tablet Take 1 tablet (2.5 mg total) by mouth 2 (two) times daily. 60 tablet 11   Budeson-Glycopyrrol-Formoterol (BREZTRI AEROSPHERE) 160-9-4.8 MCG/ACT AERO Inhale 2 puffs into the lungs 2 (two) times daily as needed (Shortness of breath/ COPD).     denosumab (PROLIA) 60 MG/ML SOSY injection Inject 60 mg as directed every 6 (six) months.     diphenhydramine-acetaminophen (TYLENOL PM) 25-500 MG TABS tablet Take 1 tablet by mouth at bedtime.     Evolocumab (REPATHA SURECLICK) 140 MG/ML SOAJ INJECT 1 PEN INTO THE SKIN EVERY 14 DAYS (Patient taking differently: Inject 140 mg into the skin every 14 (fourteen) days.) 2 mL 11   ezetimibe (ZETIA) 10 MG tablet Take 1 tablet by mouth once daily (Patient taking differently: Take 10 mg by mouth daily.) 90 tablet 2   guaiFENesin (MUCINEX) 600 MG 12 hr tablet Take 1 tablet (600 mg total) by mouth 2 (two) times daily as needed for cough or to loosen phlegm.     ipratropium-albuterol (DUONEB) 0.5-2.5 (3) MG/3ML SOLN Take 3 mLs by nebulization 3 (three) times daily. 360 mL 0   lisinopril-hydrochlorothiazide (ZESTORETIC) 20-25 MG tablet Take 0.5 tablets by mouth in the morning. (Patient taking differently: Take 0.5 tablets by mouth daily.)     Magnesium Cl-Calcium Carbonate (SLOW-MAG PO) Take 1 tablet by mouth 2 (two) times daily.     metoprolol succinate (TOPROL-XL) 25 MG 24 hr tablet Take 1 tablet (25 mg total) by mouth daily. Pt needs to make appt to see provider for further refills - 2nd attempt 90 tablet 3   metoprolol tartrate (LOPRESSOR) 25 MG tablet  Take one half - one tablet (12.5 mg - 25 mg) as needed for Heart Rate 100 or above. (Patient taking differently: Take 12.5 mg by mouth daily as needed (For heart rate 100 or above).) 30 tablet 3   nitroGLYCERIN (NITROSTAT) 0.4 MG SL tablet Place 0.4 mg under the tongue every 5 (five) minutes as needed for chest pain.     tiZANidine (ZANAFLEX) 4 MG capsule Take  4 mg by mouth daily as needed for muscle spasms.     No facility-administered medications prior to visit.    ROS    Objective:  Physical Exam   There were no vitals filed for this visit.  ***  CBC    Component Value Date/Time   WBC 10.7 (H) 08/17/2022 0509   RBC 2.85 (L) 08/17/2022 0509   HGB 11.5 (L) 08/17/2022 0509   HGB 10.2 (L) 08/14/2021 0000   HCT 32.2 (L) 08/17/2022 0509   HCT 31.9 (L) 08/14/2021 0000   PLT 243 08/17/2022 0509   PLT 280 08/14/2021 0000   MCV 113.0 (H) 08/17/2022 0509   MCV 87 08/14/2021 0000   MCH 40.4 (H) 08/17/2022 0509   MCHC 35.7 08/17/2022 0509   RDW 15.4 08/17/2022 0509   RDW 15.8 (H) 08/14/2021 0000   LYMPHSABS 2.0 06/18/2022 0445   MONOABS 0.7 06/18/2022 0445   EOSABS 0.2 06/18/2022 0445   BASOSABS 0.0 06/18/2022 0445     Chest imaging: September 2023 chest x-ray images independently reviewed showing flattening of the diaphragms and hyperinflation bilaterally, changes of scoliosis noted.  Cardiac silhouette is normal, question ossification of airways noted  PFT:  Labs: September 2023 hemoglobin 11.3 g/dL  Path:  Echo: July 2023 TTE LVEF 65 to 70%, moderate asymmetric focal basal septal left ventricular hypertrophy, grade 2 diastolic dysfunction, RV size is normal, moderately elevated PA systolic pressure estimate at 47 mmHg, left atrium is dilated  Heart Catheterization:       Assessment & Plan:   No diagnosis found.  Discussion: ***  Immunizations: Immunization History  Administered Date(s) Administered   Influenza,inj,Quad PF,6+ Mos 12/11/2013    PFIZER(Purple Top)SARS-COV-2 Vaccination 12/26/2019, 01/13/2020     Current Outpatient Medications:    acetaminophen (TYLENOL) 650 MG CR tablet, Take 1,300 mg by mouth every 8 (eight) hours as needed for pain., Disp: , Rfl:    albuterol (PROVENTIL) (2.5 MG/3ML) 0.083% nebulizer solution, Take 3 mLs (2.5 mg total) by nebulization every 2 (two) hours as needed for wheezing or shortness of breath., Disp: 75 mL, Rfl: 12   albuterol (VENTOLIN HFA) 108 (90 Base) MCG/ACT inhaler, Inhale 1 puff into the lungs every 6 (six) hours as needed for shortness of breath., Disp: , Rfl:    apixaban (ELIQUIS) 2.5 MG TABS tablet, Take 1 tablet (2.5 mg total) by mouth 2 (two) times daily., Disp: 60 tablet, Rfl: 11   Budeson-Glycopyrrol-Formoterol (BREZTRI AEROSPHERE) 160-9-4.8 MCG/ACT AERO, Inhale 2 puffs into the lungs 2 (two) times daily as needed (Shortness of breath/ COPD)., Disp: , Rfl:    denosumab (PROLIA) 60 MG/ML SOSY injection, Inject 60 mg as directed every 6 (six) months., Disp: , Rfl:    diphenhydramine-acetaminophen (TYLENOL PM) 25-500 MG TABS tablet, Take 1 tablet by mouth at bedtime., Disp: , Rfl:    Evolocumab (REPATHA SURECLICK) [id number] MG/ML SOAJ, INJECT 1 PEN INTO THE SKIN EVERY 14 DAYS (Patient taking differently: Inject 140 mg into the skin every 14 (fourteen) days.), Disp: 2 mL, Rfl: 11   ezetimibe (ZETIA) 10 MG tablet, Take 1 tablet by mouth once daily (Patient taking differently: Take 10 mg by mouth daily.), Disp: 90 tablet, Rfl: 2   guaiFENesin (MUCINEX) 600 MG 12 hr tablet, Take 1 tablet (600 mg total) by mouth 2 (two) times daily as needed for cough or to loosen phlegm., Disp: , Rfl:    ipratropium-albuterol (DUONEB) 0.5-2.5 (3) MG/3ML SOLN, Take 3 mLs by nebulization 3 (three) times daily., Disp: 360  mL, Rfl: 0   lisinopril-hydrochlorothiazide (ZESTORETIC) 20-25 MG tablet, Take 0.5 tablets by mouth in the morning. (Patient taking differently: Take 0.5 tablets by mouth daily.), Disp: , Rfl:     Magnesium Cl-Calcium Carbonate (SLOW-MAG PO), Take 1 tablet by mouth 2 (two) times daily., Disp: , Rfl:    metoprolol succinate (TOPROL-XL) 25 MG 24 hr tablet, Take 1 tablet (25 mg total) by mouth daily. Pt needs to make appt to see provider for further refills - 2nd attempt, Disp: 90 tablet, Rfl: 3   metoprolol tartrate (LOPRESSOR) 25 MG tablet, Take one half - one tablet (12.5 mg - 25 mg) as needed for Heart Rate 100 or above. (Patient taking differently: Take 12.5 mg by mouth daily as needed (For heart rate 100 or above).), Disp: 30 tablet, Rfl: 3   nitroGLYCERIN (NITROSTAT) 0.4 MG SL tablet, Place 0.4 mg under the tongue every 5 (five) minutes as needed for chest pain., Disp: , Rfl:    tiZANidine (ZANAFLEX) 4 MG capsule, Take 4 mg by mouth daily as needed for muscle spasms., Disp: , Rfl:

## 2022-08-24 NOTE — ED Triage Notes (Signed)
Patient here POV from Home.  Endorses SOB for a Few Weeks that has been progressing since it began. Seen for Same 3-4 Days ago. Somewhat Productive Cough.  No Home Oxygen. History of COPD.   Some SOB during Triage. A&Ox4. GCS 15. BIB Wheelchair.

## 2022-08-25 DIAGNOSIS — J441 Chronic obstructive pulmonary disease with (acute) exacerbation: Secondary | ICD-10-CM | POA: Diagnosis not present

## 2022-08-25 LAB — COMPREHENSIVE METABOLIC PANEL
ALT: 14 U/L (ref 0–44)
AST: 14 U/L — ABNORMAL LOW (ref 15–41)
Albumin: 3.1 g/dL — ABNORMAL LOW (ref 3.5–5.0)
Alkaline Phosphatase: 38 U/L (ref 38–126)
Anion gap: 7 (ref 5–15)
BUN: 45 mg/dL — ABNORMAL HIGH (ref 8–23)
CO2: 24 mmol/L (ref 22–32)
Calcium: 9 mg/dL (ref 8.9–10.3)
Chloride: 112 mmol/L — ABNORMAL HIGH (ref 98–111)
Creatinine, Ser: 1.14 mg/dL — ABNORMAL HIGH (ref 0.44–1.00)
GFR, Estimated: 46 mL/min — ABNORMAL LOW (ref >60)
Glucose, Bld: 136 mg/dL — ABNORMAL HIGH (ref 70–99)
Potassium: 4.4 mmol/L (ref 3.5–5.1)
Sodium: 143 mmol/L (ref 135–145)
Total Bilirubin: 0.7 mg/dL (ref 0.3–1.2)
Total Protein: 5.2 g/dL — ABNORMAL LOW (ref 6.5–8.1)

## 2022-08-25 LAB — CBC
HCT: 31.6 % — ABNORMAL LOW (ref 36.0–46.0)
Hemoglobin: 10 g/dL — ABNORMAL LOW (ref 12.0–15.0)
MCH: 34.1 pg — ABNORMAL HIGH (ref 26.0–34.0)
MCHC: 31.6 g/dL (ref 30.0–36.0)
MCV: 107.8 fL — ABNORMAL HIGH (ref 80.0–100.0)
Platelets: 233 10*3/uL (ref 150–400)
RBC: 2.93 MIL/uL — ABNORMAL LOW (ref 3.87–5.11)
RDW: 13.3 % (ref 11.5–15.5)
WBC: 11.3 10*3/uL — ABNORMAL HIGH (ref 4.0–10.5)
nRBC: 0 % (ref 0.0–0.2)

## 2022-08-25 LAB — MAGNESIUM: Magnesium: 2.1 mg/dL (ref 1.7–2.4)

## 2022-08-25 LAB — PROCALCITONIN: Procalcitonin: <0.1 ng/mL

## 2022-08-25 MED ORDER — MAGNESIUM OXIDE -MG SUPPLEMENT 400 (240 MG) MG PO TABS
200.0000 mg | ORAL_TABLET | Freq: Two times a day (BID) | ORAL | Status: DC
  Administered 2022-08-25 – 2022-08-28 (×7): 200 mg via ORAL
  Filled 2022-08-25 (×7): qty 1

## 2022-08-25 MED ORDER — SODIUM CHLORIDE 0.9 % IV SOLN
2.0000 g | INTRAVENOUS | Status: DC
  Administered 2022-08-25: 2 g via INTRAVENOUS
  Filled 2022-08-25: qty 20

## 2022-08-25 MED ORDER — TIZANIDINE HCL 4 MG PO CAPS: 4.0000 mg | ORAL_CAPSULE | Freq: Every day | ORAL | Status: DC | PRN

## 2022-08-25 MED ORDER — METOPROLOL SUCCINATE ER 25 MG PO TB24
25.0000 mg | ORAL_TABLET | Freq: Every day | ORAL | Status: DC
  Administered 2022-08-26 – 2022-08-28 (×3): 25 mg via ORAL
  Filled 2022-08-25 (×3): qty 1

## 2022-08-25 MED ORDER — GUAIFENESIN ER 600 MG PO TB12
1200.0000 mg | ORAL_TABLET | Freq: Two times a day (BID) | ORAL | Status: DC
  Administered 2022-08-25 – 2022-08-28 (×6): 1200 mg via ORAL
  Filled 2022-08-25 (×6): qty 2

## 2022-08-25 MED ORDER — DOXYCYCLINE HYCLATE 100 MG PO TABS
100.0000 mg | ORAL_TABLET | Freq: Two times a day (BID) | ORAL | Status: DC
  Administered 2022-08-25 – 2022-08-28 (×7): 100 mg via ORAL
  Filled 2022-08-25 (×7): qty 1

## 2022-08-25 MED ORDER — TIZANIDINE HCL 4 MG PO TABS
4.0000 mg | ORAL_TABLET | Freq: Every day | ORAL | Status: DC | PRN
  Administered 2022-08-25: 4 mg via ORAL
  Filled 2022-08-25: qty 1

## 2022-08-25 MED ORDER — SODIUM CHLORIDE 0.9 % IV BOLUS
500.0000 mL | Freq: Once | INTRAVENOUS | Status: AC
  Administered 2022-08-25: 500 mL via INTRAVENOUS

## 2022-08-25 MED ORDER — MAGNESIUM CHLORIDE 64 MG PO TBEC: 1.0000 | DELAYED_RELEASE_TABLET | Freq: Two times a day (BID) | ORAL | Status: DC

## 2022-08-25 MED ORDER — GUAIFENESIN-DM 100-10 MG/5ML PO SYRP
5.0000 mL | ORAL_SOLUTION | ORAL | Status: DC | PRN
  Administered 2022-08-25 – 2022-08-26 (×2): 5 mL via ORAL
  Filled 2022-08-25 (×2): qty 10

## 2022-08-25 MED ORDER — DIPHENHYDRAMINE HCL 25 MG PO CAPS
25.0000 mg | ORAL_CAPSULE | Freq: Once | ORAL | Status: AC
  Administered 2022-08-25: 25 mg via ORAL
  Filled 2022-08-25: qty 1

## 2022-08-25 NOTE — Progress Notes (Signed)
PROGRESS NOTE  Sydney Gross  DOB: 1930-07-25  PCP: Mayra Neer, MD RZ:[id number]  DOA: 08/24/2022  LOS: 1 day  Hospital Day: 2  Brief narrative: Sydney Gross is a 88 y.o. female with PMH significant for HTN, HLD, PAD, CAD, paroxysmal A-fib on Eliquis, diastolic CHF, COPD, pulmonary hypertension, chronic low back pain, history of GI bleed. Patient presented to the ED on 9/19 with complaint of shortness of breath progressive worsening for 2 weeks.  She was recently hospitalized 9/11 to 9/12 for COPD exacerbation and discharged on steroids.  She states that her symptoms initially felt improved but recurred again and would only responded partially to nebulizers.  With persistent of symptoms, patient presented to the ED on 9/19.  In the ED, patient was afebrile, hemodynamically stable, breathing in the 20s and required 2 L of oxygen by nasal cannula. Labs with WBC count 18, hemoglobin at 13.1, BUN/creatinine elevated to 43/1.22 Respiratory virus panel negative for COVID, influenza Chest imaging showed small patchy densities in right lower lung fields suggesting scarring or atelectasis or pneumonia. Small faint ground-glass densities are seen in left upper lobe, possibly scarring or interstitial pneumonia.   Patient initially required BiPAP in the ED and was later switched to oxygen by nasal cannula Admitted to hospitalist service for exacerbation  Subjective: Patient was seen and examined this morning.  Pleasant elderly Caucasian female.  Taking her breakfast.  On 3 L oxygen by nasal cannula.  Husband at bedside. Overnight, blood pressure dipped down as low as 66/44 Repeat labs this morning with WBC count improving to 11.3, hemoglobin down to 10, creatinine slightly better at 1.14  Assessment and plan: Acute COPD exacerbation Multifocal pneumonia Presented with progressive worsening shortness of breath for 2 weeks.   CT chest as above showed patchy densities in right lower lobe  and left upper lobe On admission, patient was started on IV Solu-Medrol but not on antibiotics.  With the dip in blood pressure last night, I would be concerned about brewing sepsis.  On exam today, patient denies scattered rhonchi.  I would start IV Rocephin and oral doxycycline.   Continue DuoNebs, continue Mucinex for cough Obtain procalcitonin level and trend. Recent Labs  Lab 08/24/22 1150 08/25/22 0358 08/25/22 1000  WBC 18.0* 11.3*  --   PROCALCITON  --   --  <0.10   Acute respiratory failure with hypoxia Initially required BiPAP.  Later weaned down to 2 L oxygen by nasal cannula.  Wean down as tolerated.  Check ambulatory oxygen requirement  Hypotension History of HTN, Chronic diastolic CHF Blood pressure was as low in 60s last night.  IV fluid bolus was given.  Started on maintenance fluid. Blood pressure improved to 90s this morning. PTA on metoprolol 25 mg daily, lisinopril 20 mg daily, HCTZ 25 mg daily Keep meds on hold for now. Continue IV hydration with normal saline at 75 mill per hour  AKI Baseline creatinine normal.  Patient with creatinine elevated 1.22.  Improving with IV fluid Renal ultrasound unremarkable for obstruction Recent Labs    10/05/21 1254 10/06/21 0323 06/16/22 1643 06/16/22 1654 06/17/22 0522 06/18/22 0445 08/16/22 1132 08/17/22 0509 08/24/22 1150 08/25/22 0358  BUN 25* 31* 33* 32* 22 9 31* 35* 43* 45*  CREATININE 1.28* 1.10* 1.57* 1.60* 0.96 0.93 1.06* 0.94 1.22* 1.14*   PAF  Previously on metoprolol and Eliquis    CAD/HLD Previously on metoprolol, Repatha, Zetia  Muscle spasm Tizanidine as needed  Pulmonary nodules CT chest showed few  small nodular densities in the lung fields largest measuring 6 mm in size in left upper lobe.  Radiologist recommended non-contrast chest CT at 3-6 months.  To follow-up with PCP   Goals of care   Code Status: DNR   Mobility: Encourage ambulation  Skin assessment: None    Nutritional status:   Body mass index is 20.93 kg/m.          Diet:  Diet Order             Diet Heart Room service appropriate? Yes; Fluid consistency: Thin  Diet effective now                   DVT prophylaxis:  apixaban (ELIQUIS) tablet 2.5 mg Start: 08/24/22 2200 apixaban (ELIQUIS) tablet 2.5 mg   Antimicrobials: IV Rocephin Fluid: NS at 2 mill per hour Consultants: None Family Communication: Husband at bedside  Status is: Inpatient  Continue in-hospital care because: Needs IV antibiotics, remains hypotensive Level of care: Progressive   Dispo: The patient is from: Home              Anticipated d/c is to: Pending clinical course              Patient currently is not medically stable to d/c.   Difficult to place patient No     Infusions:   sodium chloride 75 mL/hr at 08/25/22 0327   cefTRIAXone (ROCEPHIN)  IV 2 g (08/25/22 1145)    Scheduled Meds:  apixaban  2.5 mg Oral BID   doxycycline  100 mg Oral Q12H   ezetimibe  10 mg Oral Daily   guaiFENesin  1,200 mg Oral BID   ipratropium-albuterol  3 mL Nebulization Q4H   magnesium oxide  200 mg Oral BID   methylPREDNISolone (SOLU-MEDROL) injection  40 mg Intravenous Q12H   Followed by   Derrill Memo ON 08/26/2022] predniSONE  40 mg Oral Q breakfast   [START ON 08/26/2022] metoprolol succinate  25 mg Oral Daily    PRN meds: acetaminophen **OR** acetaminophen, tiZANidine   Antimicrobials: Anti-infectives (From admission, onward)    Start     Dose/Rate Route Frequency Ordered Stop   08/25/22 1100  cefTRIAXone (ROCEPHIN) 2 g in sodium chloride 0.9 % 100 mL IVPB        2 g 200 mL/hr over 30 Minutes Intravenous Every 24 hours 08/25/22 0957     08/25/22 1100  doxycycline (VIBRA-TABS) tablet 100 mg        100 mg Oral Every 12 hours 08/25/22 0957         Objective: Vitals:   08/25/22 1136 08/25/22 1339  BP:  (!) 86/62  Pulse:  100  Resp: (!) 26 20  Temp:  98.9 F (37.2 C)  SpO2: 98% 95%    Intake/Output Summary (Last  24 hours) at 08/25/2022 1411 Last data filed at 08/25/2022 0700 Gross per 24 hour  Intake 1284.37 ml  Output 0 ml  Net 1284.37 ml   Filed Weights   08/24/22 1135 08/25/22 0500  Weight: 48 kg 47 kg   Weight change:  Body mass index is 20.93 kg/m.   Physical Exam: General exam: Pleasant, elderly Caucasian female. Skin: No rashes, lesions or ulcers. HEENT: Atraumatic, normocephalic, no obvious bleeding Lungs: Scattered rhonchi bilaterally, coughs on deep breathing CVS: Regular rate and rhythm, no murmur GI/Abd soft, nontender, nondistended, bowel sound present CNS: Alert, awake, oriented x3 Psychiatry: Mood appropriate Extremities: No pedal edema, no calf tenderness  Data Review:  I have personally reviewed the laboratory data and studies available.  F/u labs ordered Unresulted Labs (From admission, onward)     Start     Ordered   08/26/22 0500  CBC with Differential/Platelet  Tomorrow morning,   R       Question:  Specimen collection method  Answer:  Lab=Lab collect   08/25/22 0959   08/26/22 [id number]  Basic metabolic panel  Tomorrow morning,   R       Question:  Specimen collection method  Answer:  Lab=Lab collect   08/25/22 0959   08/26/22 0500  Procalcitonin  Daily at 5am,   R     Question:  Specimen collection method  Answer:  Lab=Lab collect   08/25/22 [id number]            Signed, Terrilee Croak, MD [facility] 08/25/2022

## 2022-08-25 NOTE — Progress Notes (Signed)
   08/25/22 0126 08/25/22 0127  Vitals  BP (!) 69/45 (!) 66/44  MAP (mmHg) (!) 54 (!) 53  BP Location Right Arm Left Arm  BP Method Automatic Automatic  Patient Position (if appropriate) Lying Lying  ECG Heart Rate 82 77   NT reported soft BP.  On check by this RN BP is as shows.  Patient is alert and oriented without any complaints.  NP on call notified.  Awaiting orders.

## 2022-08-25 NOTE — Progress Notes (Signed)
NP ordered 555ml bolus.  Passenger transport manager at bedside to eval.  Bolus initiated and manual BP with systolic >36.  Rapid RN with systolic pressure >468 after aprox 271ml of bolus infused.  Patient continues to be asymptomatic throughout.  Will continue to monitor.

## 2022-08-26 DIAGNOSIS — J441 Chronic obstructive pulmonary disease with (acute) exacerbation: Secondary | ICD-10-CM | POA: Diagnosis not present

## 2022-08-26 LAB — CBC WITH DIFFERENTIAL/PLATELET
Abs Immature Granulocytes: 0.33 10*3/uL — ABNORMAL HIGH (ref 0.00–0.07)
Basophils Absolute: 0 10*3/uL (ref 0.0–0.1)
Basophils Relative: 0 %
Eosinophils Absolute: 0 10*3/uL (ref 0.0–0.5)
Eosinophils Relative: 0 %
HCT: 32.4 % — ABNORMAL LOW (ref 36.0–46.0)
Hemoglobin: 10.6 g/dL — ABNORMAL LOW (ref 12.0–15.0)
Immature Granulocytes: 2 %
Lymphocytes Relative: 9 %
Lymphs Abs: 1.5 10*3/uL (ref 0.7–4.0)
MCH: 36.1 pg — ABNORMAL HIGH (ref 26.0–34.0)
MCHC: 32.7 g/dL (ref 30.0–36.0)
MCV: 110.2 fL — ABNORMAL HIGH (ref 80.0–100.0)
Monocytes Absolute: 0.3 10*3/uL (ref 0.1–1.0)
Monocytes Relative: 2 %
Neutro Abs: 14.7 10*3/uL — ABNORMAL HIGH (ref 1.7–7.7)
Neutrophils Relative %: 87 %
Platelets: 230 10*3/uL (ref 150–400)
RBC: 2.94 MIL/uL — ABNORMAL LOW (ref 3.87–5.11)
RDW: 13.2 % (ref 11.5–15.5)
WBC: 16.8 10*3/uL — ABNORMAL HIGH (ref 4.0–10.5)
nRBC: 0 % (ref 0.0–0.2)

## 2022-08-26 LAB — EXPECTORATED SPUTUM ASSESSMENT W GRAM STAIN, RFLX TO RESP C

## 2022-08-26 LAB — BASIC METABOLIC PANEL
Anion gap: 7 (ref 5–15)
BUN: 37 mg/dL — ABNORMAL HIGH (ref 8–23)
CO2: 23 mmol/L (ref 22–32)
Calcium: 9.2 mg/dL (ref 8.9–10.3)
Chloride: 112 mmol/L — ABNORMAL HIGH (ref 98–111)
Creatinine, Ser: 0.95 mg/dL (ref 0.44–1.00)
GFR, Estimated: 57 mL/min — ABNORMAL LOW (ref >60)
Glucose, Bld: 144 mg/dL — ABNORMAL HIGH (ref 70–99)
Potassium: 4.7 mmol/L (ref 3.5–5.1)
Sodium: 142 mmol/L (ref 135–145)

## 2022-08-26 LAB — PROCALCITONIN: Procalcitonin: <0.1 ng/mL

## 2022-08-26 MED ORDER — TIZANIDINE HCL 4 MG PO TABS
4.0000 mg | ORAL_TABLET | Freq: Two times a day (BID) | ORAL | Status: DC | PRN
  Administered 2022-08-26 – 2022-08-27 (×3): 4 mg via ORAL
  Filled 2022-08-26 (×3): qty 1

## 2022-08-26 MED ORDER — ARFORMOTEROL TARTRATE 15 MCG/2ML IN NEBU
15.0000 ug | INHALATION_SOLUTION | Freq: Two times a day (BID) | RESPIRATORY_TRACT | Status: DC
  Administered 2022-08-26 – 2022-08-28 (×5): 15 ug via RESPIRATORY_TRACT
  Filled 2022-08-26 (×5): qty 2

## 2022-08-26 NOTE — Consult Note (Signed)
NAME:  Sydney Gross, MRN:  [id number], DOB:  [birth year], LOS: 2 ADMISSION DATE:  08/24/2022, CONSULTATION DATE:  08/26/22 REFERRING MD:  Pola Corn, CHIEF COMPLAINT:  dyspnea, cough   History of Present Illness:  88yF with history of HTN, diastolic dysfunction/PH, CAD, PAD, reported COPD who has had gradually worsening dyspnea over the last year. Worsened especially over last couple of weeks. Just discharged after admission 9/11/-9/12 after suspected AECOPD related to viral URI with sick contact at home. Had just started using breztri consistently before this last hospitalization. She says she felt improved but not quite back to her baseline. She has had increased cough over the last couple of weeks and chest congestion, isn't always able to produce phlegm. She reports no trouble with solid or liquid food dysphagia though she mentions several episodes where after drinking a large volume of fluid she immediately had dyspnea.   Here she has been started on ceftriaxone, doxy, steroids, bronchodilators. She does feel better than when she came in.   She is never smoker. She had substantial secondhand smoke exposure growing up. No occupational exposures. Never knew about having asthma in past.   Pertinent  Medical History  HTN Diastolic dysfunction PH CAD PAD ?COPD  Significant Hospital Events: Including procedures, antibiotic start and stop dates in addition to other pertinent events   9/20 admitted, started on CTX/doxy  Interim History / Subjective:    Objective   Blood pressure 138/85, pulse (!) 110, temperature 98.4 F (36.9 C), temperature source Oral, resp. rate 20, height 4\' 11"  (1.499 m), weight 47 kg, SpO2 95 %.        Intake/Output Summary (Last 24 hours) at 08/26/2022 1209 Last data filed at 08/26/2022 0900 Gross per 24 hour  Intake 1445.03 ml  Output --  Net 1445.03 ml   Filed Weights   08/24/22 1135 08/25/22 0500  Weight: 48 kg 47 kg    Examination: General  appearance: 88 y.o., female, NAD, conversant  Eyes: PERRL, tracking appropriately HENT: NCAT; MMM Neck: Trachea midline; no lymphadenopathy, no JVD Lungs: rhonchi and wheeze bl, with normal respiratory effort CV: tachy RR, no murmur  Abdomen: Soft, non-tender; non-distended, BS present  Extremities: No peripheral edema, warm Neuro: Alert and oriented to person and place, no focal deficit   Resolved Hospital Problem list     Assessment & Plan:   # Possible asthma-copd overlap in exacerbation # bronchial wall thickening # bronchiolitis Never smoker but does have some second-hand smoke. No emphysema on CT however. She does have marked bronchial wall thickening though, foci of mucus plugging, bronchiolectasis in RLL, and focus of bronchiolitis in RLL.  - swallow eval - doxycycline for 7 day course - taper prednisone over a couple weeks - sputum cx, AFB sputum cx - brovana bid - duonebs q4 - schedule gauifenesin - flutter valve 10 slow but firm puffs after each breathing treatment - resume breztri 2 puffs bid at discharge - has follow up with Dr. 95 10/10  Best Practice (right click and "Reselect all SmartList Selections" daily)   Per [facility]  Labs   CBC: Recent Labs  Lab 08/24/22 1150 08/25/22 0358 08/26/22 0443  WBC 18.0* 11.3* 16.8*  NEUTROABS 14.3*  --  14.7*  HGB 13.1 10.0* 10.6*  HCT 40.1 31.6* 32.4*  MCV 102.6* 107.8* 110.2*  PLT 313 233 230    Basic Metabolic Panel: Recent Labs  Lab 08/24/22 1150 08/25/22 0358 08/25/22 1000 08/26/22 0443  NA 143 143  --  142  K 3.8 4.4  --  4.7  CL 104 112*  --  112*  CO2 25 24  --  23  GLUCOSE 109* 136*  --  144*  BUN 43* 45*  --  37*  CREATININE 1.22* 1.14*  --  0.95  CALCIUM 10.0 9.0  --  9.2  MG  --   --  2.1  --    GFR: Estimated Creatinine Clearance: 26.8 mL/min (by C-G formula based on SCr of 0.95 mg/dL). Recent Labs  Lab 08/24/22 1150 08/25/22 0358 08/25/22 1000 08/26/22 0443  PROCALCITON  --   --   <0.10 <0.10  WBC 18.0* 11.3*  --  16.8*    Liver Function Tests: Recent Labs  Lab 08/25/22 0358  AST 14*  ALT 14  ALKPHOS 38  BILITOT 0.7  PROT 5.2*  ALBUMIN 3.1*   No results for input(s): "LIPASE", "AMYLASE" in the last 168 hours. No results for input(s): "AMMONIA" in the last 168 hours.  ABG    Component Value Date/Time   PHART 7.324 (L) 07/17/2018 1232   PCO2ART 42.1 07/17/2018 1232   PO2ART 225.0 (H) 07/17/2018 1232   HCO3 21.9 07/17/2018 1232   TCO2 21 (L) 06/16/2022 1654   ACIDBASEDEF 4.0 (H) 07/17/2018 1232   O2SAT 100.0 07/17/2018 1232     Coagulation Profile: No results for input(s): "INR", "PROTIME" in the last 168 hours.  Cardiac Enzymes: No results for input(s): "CKTOTAL", "CKMB", "CKMBINDEX", "TROPONINI" in the last 168 hours.  HbA1C: No results found for: "HGBA1C"  CBG: No results for input(s): "GLUCAP" in the last 168 hours.  Review of Systems:   12 point review of systems is negative except as in HPI  Past Medical History:  She,  has a past medical history of Allergic rhinitis, Arthritis, Chronic lower back pain, Colitis (12/2013), COPD (chronic obstructive pulmonary disease) (HCC), Coronary artery disease, GI bleed, H/O calcium pyrophosphate deposition disease (CPPD), Hiatal hernia, Hyperlipidemia, Hypertension, Mild mitral regurgitation, On home oxygen therapy, PAD (peripheral artery disease) (HCC), PAF (paroxysmal atrial fibrillation) (HCC), Pulmonary hypertension (HCC), and Shingles.   Surgical History:   Past Surgical History:  Procedure Laterality Date   ABDOMINAL AORTOGRAM W/LOWER EXTREMITY N/A 07/10/2021   Procedure: ABDOMINAL AORTOGRAM W/LOWER EXTREMITY;  Surgeon: Sherren Kerns, MD;  Location: [facility];  Service: Cardiovascular;  Laterality: N/A;   ABDOMINAL HYSTERECTOMY  1960's   APPENDECTOMY     BACK SURGERY     BIOPSY  04/20/2021   Procedure: BIOPSY;  Surgeon: Jeani Hawking, MD;  Location: [facility];  Service:  Endoscopy;;   BLEPHAROPLASTY Bilateral 05/2017   CARDIAC CATHETERIZATION     CATARACT EXTRACTION, BILATERAL Bilateral    CESAREAN SECTION  1957; 1960   CORONARY STENT INTERVENTION N/A 07/17/2018   Procedure: CORONARY STENT INTERVENTION;  Surgeon: Lyn Records, MD;  Location: [facility];  Service: Cardiovascular;  Laterality: N/A;   DACROCYSTORHINOSTOMY W/ JONES TUBE Bilateral 05/2017   "put tear duct in my eyes"   ESOPHAGOGASTRODUODENOSCOPY (EGD) WITH PROPOFOL N/A 04/20/2021   Procedure: ESOPHAGOGASTRODUODENOSCOPY (EGD) WITH PROPOFOL;  Surgeon: Jeani Hawking, MD;  Location: [facility];  Service: Endoscopy;  Laterality: N/A;   EYE SURGERY     LAPAROSCOPIC CHOLECYSTECTOMY  ~ 2009   LUMBAR DISC SURGERY  ?2004   PERIPHERAL VASCULAR INTERVENTION Left 07/10/2021   Procedure: PERIPHERAL VASCULAR INTERVENTION;  Surgeon: Sherren Kerns, MD;  Location: [facility] CV LAB;  Service: Cardiovascular;  Laterality: Left;  common, external iliac arteries  RIGHT/LEFT HEART CATH AND CORONARY ANGIOGRAPHY N/A 07/17/2018   Procedure: RIGHT/LEFT HEART CATH AND CORONARY ANGIOGRAPHY;  Surgeon: Lyn RecordsSmith, Henry W, MD;  Location: [facility];  Service: Cardiovascular;  Laterality: N/A;   TONSILLECTOMY AND ADENOIDECTOMY  ?1940's     Social History:   reports that she has never smoked. She has been exposed to tobacco smoke. She has never used smokeless tobacco. She reports current alcohol use of about 4.0 standard drinks of alcohol per week. She reports that she does not use drugs.   Family History:  Her family history includes CVA in her mother; Cancer in her brother; Emphysema (age of onset: 5169) in her father.   Allergies Allergies  Allergen Reactions   Brilinta [Ticagrelor] Shortness Of Breath   Codeine Nausea And Vomiting   Darvon [Propoxyphene] Nausea Only   Lipitor [Atorvastatin] Other (See Comments)    MYALGIAS   Rosuvastatin Other (See Comments)    MYALGIAS    Simvastatin Other (See  Comments)    MYALGIAS    Tramadol Hcl Nausea Only    nausea (08/2019)     Home Medications  Prior to Admission medications   Medication Sig Start Date End Date Taking? Authorizing Provider  acetaminophen (TYLENOL) 650 MG CR tablet Take 1,300 mg by mouth every 8 (eight) hours as needed for pain.   Yes [provider]  albuterol (PROVENTIL) (2.5 MG/3ML) 0.083% nebulizer solution Take 3 mLs (2.5 mg total) by nebulization every 2 (two) hours as needed for wheezing or shortness of breath. 08/17/22  Yes Vann, Jessica U, DO  albuterol (VENTOLIN HFA) 108 (90 Base) MCG/ACT inhaler Inhale 1 puff into the lungs every 6 (six) hours as needed for shortness of breath. 08/11/22  Yes [provider]  Budeson-Glycopyrrol-Formoterol (BREZTRI AEROSPHERE) 160-9-4.8 MCG/ACT AERO Inhale 2 puffs into the lungs 2 (two) times daily as needed (Shortness of breath/ COPD). 11/15/19  Yes [provider]  denosumab (PROLIA) 60 MG/ML SOSY injection Inject 60 mg as directed every 6 (six) months.   Yes [provider]  diphenhydramine-acetaminophen (TYLENOL PM) 25-500 MG TABS tablet Take 1 tablet by mouth at bedtime.   Yes [provider]  Evolocumab (REPATHA SURECLICK) 140 MG/ML SOAJ INJECT 1 PEN INTO THE SKIN EVERY 14 DAYS Patient taking differently: Inject 140 mg into the skin every 14 (fourteen) days. 07/19/22  Yes Lyn RecordsSmith, Henry W, MD  ezetimibe (ZETIA) 10 MG tablet Take 1 tablet by mouth once daily Patient taking differently: Take 10 mg by mouth daily. 03/24/22  Yes Lyn RecordsSmith, Henry W, MD  ipratropium-albuterol (DUONEB) 0.5-2.5 (3) MG/3ML SOLN Take 3 mLs by nebulization 3 (three) times daily. 08/17/22  Yes Vann, Jessica U, DO  lisinopril-hydrochlorothiazide (ZESTORETIC) 20-25 MG tablet Take 0.5 tablets by mouth in the morning. Patient taking differently: Take 0.5 tablets by mouth daily. 06/18/22  Yes Jonita AlbeeJohnson, Kathleen R, PA-C  Magnesium Cl-Calcium Carbonate (SLOW-MAG PO) Take 1 tablet by  mouth 2 (two) times daily.   Yes [provider]  metoprolol succinate (TOPROL-XL) 25 MG 24 hr tablet Take 1 tablet (25 mg total) by mouth daily. Pt needs to make appt to see provider for further refills - 2nd attempt 06/18/22  Yes Jonita AlbeeJohnson, Kathleen R, PA-C  metoprolol tartrate (LOPRESSOR) 25 MG tablet Take one half - one tablet (12.5 mg - 25 mg) as needed for Heart Rate 100 or above. Patient taking differently: Take 12.5 mg by mouth daily as needed (For heart rate 100 or above). 08/05/22  Yes Swinyer, Marcelino DusterMichelle  M, NP  apixaban (ELIQUIS) 2.5 MG TABS tablet Take 1 tablet (2.5 mg total) by mouth 2 (two) times daily. 06/18/22   Margie Billet, PA-C  guaiFENesin (MUCINEX) 600 MG 12 hr tablet Take 1 tablet (600 mg total) by mouth 2 (two) times daily as needed for cough or to loosen phlegm. Patient not taking: Reported on 08/24/2022 08/17/22   Geradine Girt, DO  nitroGLYCERIN (NITROSTAT) 0.4 MG SL tablet Place 0.4 mg under the tongue every 5 (five) minutes as needed for chest pain.    [provider]  tiZANidine (ZANAFLEX) 4 MG capsule Take 4 mg by mouth daily as needed for muscle spasms.    [provider]     Critical care time: n/a

## 2022-08-26 NOTE — Progress Notes (Signed)
PROGRESS NOTE  Sydney Gross  DOB: [birth year]  PCP: Mayra Neer, MD RZ:[id number]  DOA: 08/24/2022  LOS: 2 days  Hospital Day: 3  Brief narrative: Sydney Gross is a 88 y.o. female with PMH significant for HTN, HLD, PAD, CAD, paroxysmal A-fib on Eliquis, diastolic CHF, COPD, pulmonary hypertension, chronic low back pain, history of GI bleed. Patient presented to the ED on 9/19 with complaint of shortness of breath progressive worsening for 2 weeks.  She was recently hospitalized 9/11 to 9/12 for COPD exacerbation and discharged on steroids.  She states that her symptoms initially felt improved but recurred again and would only responded partially to nebulizers.  With persistent of symptoms, patient presented to the ED on 9/19.  In the ED, patient was afebrile, hemodynamically stable, breathing in the 20s and required 2 L of oxygen by nasal cannula. Labs with WBC count 18, hemoglobin at 13.1, BUN/creatinine elevated to 43/1.22 Respiratory virus panel negative for COVID, influenza Chest imaging showed small patchy densities in right lower lung fields suggesting scarring or atelectasis or pneumonia. Small faint ground-glass densities are seen in left upper lobe, possibly scarring or interstitial pneumonia.   Patient initially required BiPAP in the ED and was later switched to oxygen by nasal cannula Admitted to hospitalist service for exacerbation  Subjective: Patient was seen and examined this morning.   Pleasant elderly Caucasian female.  Sitting up in chair.  In fact she was ambulating on the hallway without assistance and without oxygen minutes prior to that.  At the time of my evaluation, patient was gently wheezing and had cough but not requiring supplemental oxygen.  Family at bedside. Patient states he has an appointment with pulmonologist Dr. Lake Bells tomorrow morning which she is going to miss.  She wanted to see pulmonologist here in the hospital.  Pulmonary consult called  and appreciated.  Assessment and plan: Acute COPD exacerbation Multifocal pneumonia/bronchiolitis Presented with progressive worsening shortness of breath for 2 weeks.   CT chest as above showed patchy densities in right lower lobe and left upper lobe Currently she is on IV Rocephin, oral doxycycline, IV Solu-Medrol.  Seems to be gradually improving but he still has wheezing and cough after ambulation in the hallway.  Pulm consult appreciated.  Switch antibiotics to oral doxycycline.  I would continue IV steroids for today and plan to discharge her on tapering course of oral prednisone tomorrow.  Continue DuoNebs, continue Mucinex for cough, flutter valve Follow-up with pulmonology as an outpatient. Recent Labs  Lab 08/24/22 1150 08/25/22 0358 08/25/22 1000 08/26/22 0443  WBC 18.0* 11.3*  --  16.8*  PROCALCITON  --   --  <0.10 <0.10    Acute respiratory failure with hypoxia Initially required BiPAP.  Later weaned down to 2 L oxygen by nasal cannula.  Able to ambulate in the hallway today without oxygen but had significant wheezing and cough after that.  Hypotension History of HTN, Chronic diastolic CHF Blood pressure was as low in 60s last night.  IV fluid bolus was given.  Started on maintenance fluid. Blood pressure improved to 90s this morning. PTA on metoprolol 25 mg daily, lisinopril 20 mg daily, HCTZ 25 mg daily Metoprolol resumed this morning.  Other medicines remain on hold.  Adequately hydrated.  Stop IV fluid today.  AKI Baseline creatinine normal.  Patient with creatinine elevated 1.22.  Improved with IV fluid.   Renal ultrasound unremarkable for Recent Labs    10/06/21 0323 06/16/22 1643 06/16/22 1654 06/17/22 0522  06/18/22 0445 08/16/22 1132 08/17/22 0509 08/24/22 1150 08/25/22 0358 08/26/22 0443  BUN 31* 33* 32* 22 9 31* 35* 43* 45* 37*  CREATININE 1.10* 1.57* 1.60* 0.96 0.93 1.06* 0.94 1.22* 1.14* 0.95    PAF  Previously on metoprolol and Eliquis     CAD/HLD Previously on metoprolol, Repatha, Zetia  Muscle spasm Tizanidine BID as needed  Pulmonary nodules CT chest showed few small nodular densities in the lung fields largest measuring 6 mm in size in left upper lobe.  Radiologist recommended non-contrast chest CT at 3-6 months.  To follow-up with PCP   Goals of care   Code Status: DNR   Mobility: Encourage ambulation  Skin assessment: None    Nutritional status:  Body mass index is 20.93 kg/m.          Diet:  Diet Order             Diet Heart Room service appropriate? Yes; Fluid consistency: Thin  Diet effective now                   DVT prophylaxis:  apixaban (ELIQUIS) tablet 2.5 mg Start: 08/24/22 2200 apixaban (ELIQUIS) tablet 2.5 mg   Antimicrobials: Oral Doxy Fluid: Okay to stop fluid Consultants: None Family Communication: Husband and daughter at bedside  Status is: Inpatient  Continue in-hospital care because: Needs IV steroids Level of care: Progressive   Dispo: The patient is from: Home              Anticipated d/c is to: Hopefully home tomorrow              Patient currently is not medically stable to d/c.   Difficult to place patient No     Infusions:     Scheduled Meds:  apixaban  2.5 mg Oral BID   arformoterol  15 mcg Nebulization BID   doxycycline  100 mg Oral Q12H   ezetimibe  10 mg Oral Daily   guaiFENesin  1,200 mg Oral BID   ipratropium-albuterol  3 mL Nebulization Q4H   magnesium oxide  200 mg Oral BID   metoprolol succinate  25 mg Oral Daily   predniSONE  40 mg Oral Q breakfast    PRN meds: acetaminophen **OR** acetaminophen, guaiFENesin-dextromethorphan, tiZANidine   Antimicrobials: Anti-infectives (From admission, onward)    Start     Dose/Rate Route Frequency Ordered Stop   08/25/22 1100  cefTRIAXone (ROCEPHIN) 2 g in sodium chloride 0.9 % 100 mL IVPB  Status:  Discontinued        2 g 200 mL/hr over 30 Minutes Intravenous Every 24 hours 08/25/22 0957  08/26/22 1305   08/25/22 1100  doxycycline (VIBRA-TABS) tablet 100 mg        100 mg Oral Every 12 hours 08/25/22 0957 09/01/22 0959       Objective: Vitals:   08/26/22 1144 08/26/22 1330  BP:  (!) 98/53  Pulse:  75  Resp:  20  Temp:  98.2 F (36.8 C)  SpO2: 95% 96%    Intake/Output Summary (Last 24 hours) at 08/26/2022 1400 Last data filed at 08/26/2022 1327 Gross per 24 hour  Intake 1685.03 ml  Output --  Net 1685.03 ml    Filed Weights   08/24/22 1135 08/25/22 0500  Weight: 48 kg 47 kg   Weight change:  Body mass index is 20.93 kg/m.   Physical Exam: General exam: Pleasant, elderly Caucasian female. Skin: No rashes, lesions or ulcers. HEENT: Atraumatic, normocephalic, no obvious  bleeding Lungs: Post ambulation, she has rhonchi and mild wheezing bilaterally CVS: Regular rate and rhythm, no murmur GI/Abd soft, nontender, nondistended, bowel sound present CNS: Alert, awake, oriented x3 Psychiatry: Mood appropriate Extremities: No pedal edema, no calf tenderness  Data Review: I have personally reviewed the laboratory data and studies available.  F/u labs ordered Unresulted Labs (From admission, onward)     Start     Ordered   08/26/22 1223  Expectorated Sputum Assessment w Gram Stain, Rflx to Resp Cult  Once,   R        08/26/22 1223   08/26/22 1223  Acid Fast Smear (AFB)  (AFB smear + Culture w reflexed sensitivities panel)  Once,   R       See Hyperspace for full Linked Orders Report.   08/26/22 1223   08/26/22 1223  Acid Fast Culture with reflexed sensitivities  (AFB smear + Culture w reflexed sensitivities panel)  Once,   R       See Hyperspace for full Linked Orders Report.   08/26/22 1223   08/26/22 0500  Procalcitonin  Daily at 5am,   R     Question:  Specimen collection method  Answer:  Lab=Lab collect   08/25/22 [id number]            Signed, Terrilee Croak, MD [facility] 08/26/2022

## 2022-08-26 NOTE — Evaluation (Signed)
Physical Therapy Evaluation Only Patient Details Name: Sydney Gross MRN: [id number] DOB: [birth year] Today's Date: 08/26/2022  History of Present Illness  Sydney Gross is a 88 y.o. female presents with SOB. Of note, pt admitted 9/11 diagnosed with COPD exacerbation and d/c home on steroids. PMH: COPD, afib, HTN, chronic diastolic HF.   Clinical Impression  Physical therapy evaluation completed and no further PT services recommended at this time. Pt ambulates 350 ft without AD, gait WFL, on RA with SpO2 96-99%, dyspnea noted. Patient discharged to care of nursing for ambulation daily as tolerated for length of stay.        Recommendations for follow up therapy are one component of a multi-disciplinary discharge planning process, led by the attending physician.  Recommendations may be updated based on patient status, additional functional criteria and insurance authorization.  Follow Up Recommendations No PT follow up      Assistance Recommended at Discharge PRN  Patient can return home with the following  Assistance with cooking/housework;Assist for transportation    Equipment Recommendations None recommended by PT  Recommendations for Other Services       Functional Status Assessment Patient has had a recent decline in their functional status and demonstrates the ability to make significant improvements in function in a reasonable and predictable amount of time.     Precautions / Restrictions Precautions Precautions: Other (comment) Precaution Comments: monitor O2 Restrictions Weight Bearing Restrictions: No      Mobility  Bed Mobility Overal bed mobility: Modified Independent   Transfers Overall transfer level: Modified independent Equipment used: None   Ambulation/Gait Ambulation/Gait assistance: Supervision Gait Distance (Feet): 350 Feet Assistive device: IV Pole Gait Pattern/deviations: WFL(Within Functional Limits)  General Gait Details: pt pushing IV  pole around room and in hallway clearing past obstacles at safe distance, dyspnea 3/4, on RA with SpO2 96-99%, good steadiness without LOB  Stairs            Wheelchair Mobility    Modified Rankin (Stroke Patients Only)       Balance Overall balance assessment: No apparent balance deficits (not formally assessed)             Pertinent Vitals/Pain Pain Assessment Pain Assessment: No/denies pain    Home Living Family/patient expects to be discharged to:: Private residence Living Arrangements: Spouse/significant other Available Help at Discharge: Family;Available 24 hours/day Type of Home: House Home Access: Stairs to enter Entrance Stairs-Rails: Right;Left;Can reach both Entrance Stairs-Number of Steps: 6-8   Home Layout: One level Home Equipment: Grab bars - tub/shower;Shower Land (2 wheels);Cane - single point;Rollator (4 wheels)      Prior Function Prior Level of Function : Independent/Modified Independent;Driving  Mobility Comments: Pt reports ind with community distances, no AD use ADLs Comments: Pt reports ind with ADLs/IADLs     Hand Dominance        Extremity/Trunk Assessment   Upper Extremity Assessment Upper Extremity Assessment: Overall WFL for tasks assessed    Lower Extremity Assessment Lower Extremity Assessment: Overall WFL for tasks assessed    Cervical / Trunk Assessment Cervical / Trunk Assessment: Normal  Communication   Communication: No difficulties  Cognition Arousal/Alertness: Awake/alert Behavior During Therapy: WFL for tasks assessed/performed Overall Cognitive Status: Within Functional Limits for tasks assessed     General Comments      Exercises     Assessment/Plan    PT Assessment Patient does not need any further PT services  PT Problem List  PT Treatment Interventions      PT Goals (Current goals can be found in the Care Plan section)  Acute Rehab PT Goals Patient Stated Goal: return  home PT Goal Formulation: All assessment and education complete, DC therapy    Frequency       Co-evaluation               AM-PAC PT "6 Clicks" Mobility  Outcome Measure Help needed turning from your back to your side while in a flat bed without using bedrails?: None Help needed moving from lying on your back to sitting on the side of a flat bed without using bedrails?: None Help needed moving to and from a bed to a chair (including a wheelchair)?: None Help needed standing up from a chair using your arms (e.g., wheelchair or bedside chair)?: None Help needed to walk in hospital room?: A Little Help needed climbing 3-5 steps with a railing? : A Little 6 Click Score: 22    End of Session   Activity Tolerance: Patient tolerated treatment well Patient left: in chair;with call bell/phone within reach;with family/visitor present Nurse Communication: Mobility status;Other (comment) (SpO2) PT Visit Diagnosis: Other abnormalities of gait and mobility (R26.89)    Time: 2778-2423 PT Time Calculation (min) (ACUTE ONLY): 19 min   Charges:   PT Evaluation $PT Eval Low Complexity: 1 Low           Tori Yaa Donnellan PT, DPT 08/26/22, 12:37 PM

## 2022-08-26 NOTE — Progress Notes (Signed)
PT demonstrated hands on understanding of Flutter device- NPC at this time. 

## 2022-08-27 ENCOUNTER — Inpatient Hospital Stay: Payer: Medicare Other | Admitting: Pulmonary Disease

## 2022-08-27 DIAGNOSIS — J441 Chronic obstructive pulmonary disease with (acute) exacerbation: Secondary | ICD-10-CM | POA: Diagnosis not present

## 2022-08-27 LAB — PROCALCITONIN: Procalcitonin: <0.1 ng/mL

## 2022-08-27 MED ORDER — IPRATROPIUM-ALBUTEROL 0.5-2.5 (3) MG/3ML IN SOLN
3.0000 mL | Freq: Four times a day (QID) | RESPIRATORY_TRACT | Status: DC
  Administered 2022-08-27 – 2022-08-28 (×5): 3 mL via RESPIRATORY_TRACT
  Filled 2022-08-27 (×5): qty 3

## 2022-08-27 MED ORDER — AEROCHAMBER PLUS FLO-VU SMALL MISC
1.0000 | Freq: Once | Status: AC
  Administered 2022-08-27: 1
  Filled 2022-08-27: qty 1

## 2022-08-27 MED ORDER — HYDROCHLOROTHIAZIDE 12.5 MG PO TABS
12.5000 mg | ORAL_TABLET | Freq: Every day | ORAL | Status: DC
  Administered 2022-08-27 – 2022-08-28 (×2): 12.5 mg via ORAL
  Filled 2022-08-27 (×2): qty 1

## 2022-08-27 NOTE — Evaluation (Signed)
Clinical/Bedside Swallow Evaluation Patient Details  Name: Sydney Gross MRN: [id number] Date of Birth: 1930-02-27  Today's Date: 08/27/2022 Time: SLP Start Time (ACUTE ONLY): 72 SLP Stop Time (ACUTE ONLY): 1640 SLP Time Calculation (min) (ACUTE ONLY): 50 min  Past Medical History:  Past Medical History:  Diagnosis Date   Allergic rhinitis    Arthritis    "maybe a little in my knees and back" (07/17/2018)   Chronic lower back pain    Colitis 12/2013   "never had this before"   COPD (chronic obstructive pulmonary disease) ([place])    "questionable/Dr. Tamala Julian" (07/17/2018)   Coronary artery disease    GI bleed    H/O calcium pyrophosphate deposition disease (CPPD)    Hiatal hernia    Hyperlipidemia    Hypertension    Mild mitral regurgitation    On home oxygen therapy    "I've used it 2 times in 2 yrs" (07/17/2018)   PAD (peripheral artery disease) ([facility])    PAF (paroxysmal atrial fibrillation) ([place])    Pulmonary hypertension ([place])    Shingles    Past Surgical History:  Past Surgical History:  Procedure Laterality Date   ABDOMINAL AORTOGRAM W/LOWER EXTREMITY N/A 07/10/2021   Procedure: ABDOMINAL AORTOGRAM W/LOWER EXTREMITY;  Surgeon: Elam Dutch, MD;  Location: [facility];  Service: Cardiovascular;  Laterality: N/A;   ABDOMINAL HYSTERECTOMY  1960's   APPENDECTOMY     BACK SURGERY     BIOPSY  04/20/2021   Procedure: BIOPSY;  Surgeon: Carol Ada, MD;  Location: [facility];  Service: Endoscopy;;   BLEPHAROPLASTY Bilateral 05/2017   CARDIAC CATHETERIZATION     CATARACT EXTRACTION, BILATERAL Bilateral    CESAREAN SECTION  1957; 1960   CORONARY STENT INTERVENTION N/A 07/17/2018   Procedure: CORONARY STENT INTERVENTION;  Surgeon: Belva Crome, MD;  Location: [facility];  Service: Cardiovascular;  Laterality: N/A;   DACROCYSTORHINOSTOMY W/ JONES TUBE Bilateral 05/2017   "put tear duct in my eyes"   ESOPHAGOGASTRODUODENOSCOPY (EGD) WITH PROPOFOL N/A  04/20/2021   Procedure: ESOPHAGOGASTRODUODENOSCOPY (EGD) WITH PROPOFOL;  Surgeon: Carol Ada, MD;  Location: [facility];  Service: Endoscopy;  Laterality: N/A;   EYE SURGERY     LAPAROSCOPIC CHOLECYSTECTOMY  ~ 2009   LUMBAR DISC SURGERY  ?2004   PERIPHERAL VASCULAR INTERVENTION Left 07/10/2021   Procedure: PERIPHERAL VASCULAR INTERVENTION;  Surgeon: Elam Dutch, MD;  Location: [facility];  Service: Cardiovascular;  Laterality: Left;  common, external iliac arteries   RIGHT/LEFT HEART CATH AND CORONARY ANGIOGRAPHY N/A 07/17/2018   Procedure: RIGHT/LEFT HEART CATH AND CORONARY ANGIOGRAPHY;  Surgeon: Belva Crome, MD;  Location: [facility];  Service: Cardiovascular;  Laterality: N/A;   TONSILLECTOMY AND ADENOIDECTOMY  ?1940's   HPI:  Per MD note "88 y.o. female presents with SOB. Pt admitted 9/11 diagnosed with COPD.   PMH: COPD, afib, HTN, chronic diastolic HF.  Worsened especially over last couple of weeks. Just discharged after admission 9/11/-9/12 after suspected AECOPD related to viral URI with sick contact at home. Had just started using breztri consistently before this last hospitalization. She says she felt improved but not quite back to her baseline. She has had increased cough over the last couple of weeks and chest congestion, isn't always able to produce phlegm. She reports no trouble with solid or liquid food dysphagia though she mentions several episodes where after drinking a large volume of fluid she immediately had dyspnea.  She has undergone prior  [facility]  DG esophagus 2010 showed prominent cricopharyngeus, dysmotlity-tertiary contractions, no reflux and 04/2005 DG esoph Mild transient delay of barium tablet at the level of the transverse aorta which did slightly reproduce the patient's symptoms - the esophageal lumen at this level but no evidence of mass, mucosal abnormality or focal stricture, Gastroesophageal reflux,  Mildly prominent cricopharyngeus  muscle impression without Zenker's diverticulum.  Pt chest imaging 9/19 showed right lower lobe density suggests ATX or scarring.   Swallow eval ordered.    Assessment / Plan / Recommendation  Clinical Impression  Ms Arno demonstrates no focal CN deficits and is observed to have cough at baseline and with po intake.  She denies issues with dysphagia.  Two prior esophagrams showed dysmotility, prominent CP but not reflux.  Her swallow appeared timely with baseline voice throughout.  Adequate mastication ability as well as speech.  Pt reports productive cough at times.  Pt's daughter, Sydney Gross, present and reports pt tosses her head back to swallow pills, which SlP advised against.  Reviewed MBS study loops to help pt understand dysphagia as well as reviewed her study.  She did not pass  yale swallow screen due to her subtle cough at approx 50 seconds after liquid consumption but she easily swallowed the water.  Can not rule out subtle aspiration but doubtful.  Located note from Dr Lore[facility] that indicated recommendation for pt to follow reflux precautions, therefore provided her with list of reflux precautions.  If pt is having any aspiration, suspect her known dysmotility contributing.  She reports this is her first pna, admits she was around sick contact for her 89th birthday celebration.  No SLP follow up indicated as functional swallow present. SLP Visit Diagnosis: Dysphagia, oral phase (R13.11)    Aspiration Risk  No limitations    Diet Recommendation Regular;Thin liquid   Liquid Administration via: Cup;Straw Medication Administration: Whole meds with liquid Supervision: Patient able to self feed Compensations: Slow rate;Small sips/bites Postural Changes: Seated upright at 90 degrees;Remain upright for at least 30 minutes after po intake    Other  Recommendations Oral Care Recommendations: Oral care BID    Recommendations for follow up therapy are one component of a multi-disciplinary discharge  planning process, led by the attending physician.  Recommendations may be updated based on patient status, additional functional criteria and insurance authorization.  Follow up Recommendations No SLP follow up      Assistance Recommended at Discharge None  Functional Status Assessment Patient has not had a recent decline in their functional status  Frequency and Duration     N/a       Prognosis   N/a     Swallow Study   General Date of Onset: 08/27/22 HPI: Per MD note "88 y.o. female presents with SOB. Pt admitted 9/11 diagnosed with COPD.   PMH: COPD, afib, HTN, chronic diastolic HF.  Worsened especially over last couple of weeks. Just discharged after admission 9/11/-9/12 after suspected AECOPD related to viral URI with sick contact at home. Had just started using breztri consistently before this last hospitalization. She says she felt improved but not quite back to her baseline. She has had increased cough over the last couple of weeks and chest congestion, isn't always able to produce phlegm. She reports no trouble with solid or liquid food dysphagia though she mentions several episodes where after drinking a large volume of fluid she immediately had dyspnea.  She has undergone prior esophagrams at [facility].  DG esophagus 2010  showed prominent cricopharyngeus, dysmotlity-tertiary contractions, no reflux and 04/2005 DG esoph Mild transient delay of barium tablet at the level of the transverse aorta which did slightly reproduce the patient's symptoms - the esophageal lumen at this level but no evidence of mass, mucosal abnormality or focal stricture, Gastroesophageal reflux,  Mildly prominent cricopharyngeus muscle impression without Zenker's diverticulum.  Pt chest imaging 9/19 showed right lower lobe density suggests ATX or scarring.   Swallow eval ordered. Type of Study: Bedside Swallow Evaluation Previous Swallow Assessment: see HPI Diet Prior to this Study: Regular;Thin liquids Temperature  Spikes Noted: No Respiratory Status: Room air History of Recent Intubation: No Behavior/Cognition: Alert;Cooperative Oral Cavity Assessment: Within Functional Limits Oral Care Completed by SLP: No Oral Cavity - Dentition: Adequate natural dentition Vision: Functional for self-feeding Self-Feeding Abilities: Able to feed self Patient Positioning: Upright in bed Baseline Vocal Quality: Hoarse (pt attributes to cough) Volitional Cough: Strong Volitional Swallow: Able to elicit    Oral/Motor/Sensory Function Overall Oral Motor/Sensory Function: Within functional limits   Ice Chips Ice chips: Not tested   Thin Liquid Thin Liquid: Within functional limits Presentation: Cup;Self Fed;Straw Other Comments: Pt coughing with and without intake - Did not appear coorelated to po.  She did not pass 3 ounce [place] due to very subtle cough approx 50 seconds after consumption, but pt was able to sequential consume the water.  Eructation noted which pt denied being productive.    Nectar Thick Nectar Thick Liquid: Not tested   Honey Thick Honey Thick Liquid: Not tested   Puree Puree: Not tested   Solid     Solid: Within functional limits Presentation: Self Fed      Chales Abrahams 08/27/2022,5:31 PM Rolena Infante, MS [facility] SLP Acute Rehab Services Office [phone] Pager [phone]

## 2022-08-27 NOTE — Progress Notes (Signed)
Informed at 1207 by central monitoring that pt had a 6 second run of SVT with HR in the 180s.  Assessed patient who reported feeling anxious moments before, but denied SOB, chest pain.  MD notified.  Angie Fava, RN

## 2022-08-27 NOTE — Progress Notes (Signed)
Patient ambulated with husband in hallway without assistance, no c/o SOB, weakness.  Angie Fava, RN

## 2022-08-27 NOTE — Consult Note (Addendum)
NAME:  Sydney Gross, MRN:  [id number], DOB:  May 16, 1930, LOS: 3 ADMISSION DATE:  08/24/2022, CONSULTATION DATE:  08/26/22 REFERRING MD:  Pietro Cassis, CHIEF COMPLAINT:  dyspnea, cough   History of Present Illness:  88yF with history of HTN, diastolic dysfunction/PH, CAD, PAD, reported COPD who has had gradually worsening dyspnea over the last year. Worsened especially over last couple of weeks. Just discharged after admission 9/11/-9/12 after suspected AECOPD related to viral URI with sick contact at home. Had just started using breztri consistently before this last hospitalization. She says she felt improved but not quite back to her baseline. She has had increased cough over the last couple of weeks and chest congestion, isn't always able to produce phlegm. She reports no trouble with solid or liquid food dysphagia though she mentions several episodes where after drinking a large volume of fluid she immediately had dyspnea.   Here she has been started on ceftriaxone, doxy, steroids, bronchodilators. She does feel better than when she came in.   She is never smoker. She had substantial secondhand smoke exposure growing up. No occupational exposures. Never knew about having asthma in past.   Pertinent  Medical History  HTN Diastolic dysfunction PH CAD PAD ?COPD  Significant Hospital Events: Including procedures, antibiotic start and stop dates in addition to other pertinent events   9/20 admitted, started on CTX/doxy 9/21 started on brovana bid, flutter valve, guaifenesin  Interim History / Subjective:  Slow progress. Still with productive cough, feels dyspneic with light activity.  Objective   Blood pressure (!) 162/86, pulse 91, temperature 99.3 F (37.4 C), temperature source Oral, resp. rate 16, height 4\' 11"  (1.499 m), weight 47 kg, SpO2 97 %.        Intake/Output Summary (Last 24 hours) at 08/27/2022 1422 Last data filed at 08/27/2022 1009 Gross per 24 hour  Intake 240 ml   Output 1 ml  Net 239 ml   Filed Weights   08/24/22 1135 08/25/22 0500  Weight: 48 kg 47 kg    Examination: General appearance: 88 y.o., female, female, NAD, conversant  Eyes: PERRL, tracking appropriately HENT: NCAT; MMM Lungs: rhonchi/wheeze bl similar to yeterday, with normal respiratory effort CV: RRR, no murmur  Abdomen: Soft, non-tender; non-distended, BS present  Extremities: No peripheral edema, warm Skin: Normal turgor and texture; no rash Psych: Appropriate affect Neuro: Alert and oriented to person and place, no focal deficit    Labs/imaging reviewed: Sputum, AFB cx pending  No new imaging  Resolved Hospital Problem list     Assessment & Plan:   # Possible asthma-copd overlap in exacerbation # bronchial wall thickening # bronchiolitis Never smoker but does have some second-hand smoke. No emphysema on CT however. She does have marked bronchial wall thickening though, foci of mucus plugging, bronchiolectasis in RLL, and focus of bronchiolitis in RLL.  - swallow eval - doxycycline for 14 day course - she has borderline bronchiolectasis/bronchiectasis - taper prednisone over a couple weeks - f/u sputum cx, AFB sputum cx - brovana bid - duonebs q4 - continue guaifenesin until productive cough/chest congestion improved - flutter valve 10 slow but firm puffs after each breathing treatment - resume breztri 2 puffs bid with spacer at discharge - rinse mouth/gargle after each use, wash spacer daily - has follow up with Dr. Lake Bells 10/10  # Pulmonary nodules - utility of 3-6 month CT surveillance can be discussed in clinic in this never smoking 88 year old patient   Best Practice (right click and "Reselect  all SmartList Selections" daily)   Per [facility]  Critical care time: n/a

## 2022-08-27 NOTE — Progress Notes (Signed)
[name]  DOB: 02-16-1930  PCP: Mayra Neer, MD QZR:[id number]  DOA: 08/24/2022  LOS: 3 days  Hospital Day: 4  Brief narrative: Sydney Gross is a 88 y.o. female with PMH significant for HTN, HLD, PAD, CAD, paroxysmal A-fib on Eliquis, diastolic CHF, COPD, pulmonary hypertension, chronic low back pain, history of GI bleed. Patient presented to the ED on 9/19 with complaint of shortness of breath progressive worsening for 2 weeks.  She was recently hospitalized 9/11 to 9/12 for COPD exacerbation and discharged on steroids.  She states that her symptoms initially felt improved but recurred again and would only responded partially to nebulizers.  With persistent of symptoms, patient presented to the ED on 9/19.  In the ED, patient was afebrile, hemodynamically stable, breathing in the 20s and required 2 L of oxygen by nasal cannula. Labs with WBC count 18, hemoglobin at 13.1, BUN/creatinine elevated to 43/1.22 Respiratory virus panel negative for COVID, influenza Chest imaging showed small patchy densities in right lower lung fields suggesting scarring or atelectasis or pneumonia. Small faint ground-glass densities are seen in left upper lobe, possibly scarring or interstitial pneumonia.   Patient initially required BiPAP in the ED and was later switched to oxygen by nasal cannula Admitted to hospitalist service for exacerbation  Subjective: Patient was seen and examined this morning.   Lying on bed. When she walked in the hallway, she did not require oxygen. However, she continues to cough and wheezing.  Mildly with bilateral rales.  Patient does not feel comfortable going home.    Assessment and plan: Acute COPD exacerbation Multifocal pneumonia/bronchiolitis Presented with progressive worsening shortness of breath for 2 weeks.   CT chest as above showed patchy densities in right lower lobe and left upper lobe She was started on IV Rocephin, oral  doxycycline, IV Solu-Medrol.  Clinically improving and not requiring supplemental oxygen to ambulate on the hallway but continues to have coughing, wheezing..  Does not feel comfortable with discharge today.   9/21, pulm consult appreciated.  Switch antibiotics to oral doxycycline.  I would continue IV steroids for today and plan to discharge her on tapering course of oral prednisone tomorrow.  Continue DuoNebs, continue Mucinex for cough, flutter valve Follow-up with pulmonology as an outpatient. Recent Labs  Lab 08/24/22 1150 08/25/22 0358 08/25/22 1000 08/26/22 0443 08/27/22 0422  WBC 18.0* 11.3*  --  16.8*  --   PROCALCITON  --   --  <0.10 <0.10 <0.10    Acute respiratory failure with hypoxia Initially required BiPAP.  Later weaned down to 2 L oxygen by nasal cannula.  Able to ambulate in the hallway today without oxygen but had significant wheezing and cough after that.  Hypotension History of HTN, Chronic diastolic CHF Blood pressure was as low in 60s last night.  IV fluid bolus was given.  Started on maintenance fluid. Blood pressure improving. PTA on metoprolol 25 mg daily, lisinopril 10 mg daily, HCTZ 12.5 mg daily Metoprolol resumed yesterday.  Resume lisinopril and HCTZ today.  AKI Baseline creatinine normal.  Patient with creatinine elevated 1.22.  Improved with IV fluid.   Renal ultrasound unremarkable for Recent Labs    10/06/21 0323 06/16/22 1643 06/16/22 1654 06/17/22 0522 06/18/22 0445 08/16/22 1132 08/17/22 0509 08/24/22 1150 08/25/22 0358 08/26/22 0443  BUN 31* 33* 32* 22 9 31* 35* 43* 45* 37*  CREATININE 1.10* 1.57* 1.60* 0.96 0.93 1.06* 0.94 1.22* 1.14* 0.95    PAF  Previously on metoprolol  and Eliquis    CAD/HLD Previously on metoprolol, Repatha, Zetia  Muscle spasm Tizanidine BID as needed  Pulmonary nodules CT chest showed few small nodular densities in the lung fields largest measuring 6 mm in size in left upper lobe.  Radiologist  recommended non-contrast chest CT at 3-6 months.  To follow-up with PCP   Goals of care   Code Status: DNR   Mobility: Encourage ambulation  Skin assessment: None    Nutritional status:  Body mass index is 20.93 kg/m.          Diet:  Diet Order             Diet Heart Room service appropriate? Yes; Fluid consistency: Thin  Diet effective now                   DVT prophylaxis:  apixaban (ELIQUIS) tablet 2.5 mg Start: 08/24/22 2200 apixaban (ELIQUIS) tablet 2.5 mg   Antimicrobials: Oral Doxy, IV Ancef Fluid: Okay to stop fluid Consultants: None Family Communication: Husband at bedside  Status is: Inpatient  Continue in-hospital care because: Needs IV steroids Level of care: Progressive   Dispo: The patient is from: Home              Anticipated d/c is to: Hopefully home tomorrow still has shortness of breath, wheezing, cough              Patient currently is not medically stable to d/c.   Difficult to place patient No     Infusions:     Scheduled Meds:  apixaban  2.5 mg Oral BID   arformoterol  15 mcg Nebulization BID   doxycycline  100 mg Oral Q12H   ezetimibe  10 mg Oral Daily   guaiFENesin  1,200 mg Oral BID   ipratropium-albuterol  3 mL Nebulization Q6H   magnesium oxide  200 mg Oral BID   metoprolol succinate  25 mg Oral Daily   predniSONE  40 mg Oral Q breakfast    PRN meds: acetaminophen **OR** acetaminophen, guaiFENesin-dextromethorphan, tiZANidine   Antimicrobials: Anti-infectives (From admission, onward)    Start     Dose/Rate Route Frequency Ordered Stop   08/25/22 1100  cefTRIAXone (ROCEPHIN) 2 g in sodium chloride 0.9 % 100 mL IVPB  Status:  Discontinued        2 g 200 mL/hr over 30 Minutes Intravenous Every 24 hours 08/25/22 0957 08/26/22 1305   08/25/22 1100  doxycycline (VIBRA-TABS) tablet 100 mg        100 mg Oral Every 12 hours 08/25/22 0957 09/01/22 0959       Objective: Vitals:   08/27/22 1322 08/27/22 1334  BP:   (!) 162/86  Pulse:  91  Resp:  16  Temp:  99.3 F (37.4 C)  SpO2: 99% 97%    Intake/Output Summary (Last 24 hours) at 08/27/2022 1351 Last data filed at 08/27/2022 1009 Gross per 24 hour  Intake 240 ml  Output 1 ml  Net 239 ml    Filed Weights   08/24/22 1135 08/25/22 0500  Weight: 48 kg 47 kg   Weight change:  Body mass index is 20.93 kg/m.   Physical Exam: General exam: Pleasant, elderly Caucasian female. Skin: No rashes, lesions or ulcers. HEENT: Atraumatic, normocephalic, no obvious bleeding Lungs: Post ambulation, she has rhonchi and mild wheezing bilaterally with cough. CVS: Regular rate and rhythm, no murmur GI/Abd soft, nontender, nondistended, bowel sound present CNS: Alert, awake, oriented x3 Psychiatry: Mood appropriate Extremities:  No pedal edema, no calf tenderness  Data Review: I have personally reviewed the laboratory data and studies available.  F/u labs ordered Unresulted Labs (From admission, onward)     Start     Ordered   08/26/22 1223  Acid Fast Smear (AFB)  (AFB smear + Culture w reflexed sensitivities panel)  Once,   R       See Hyperspace for full Linked Orders Report.   08/26/22 1223   08/26/22 1223  Acid Fast Culture with reflexed sensitivities  (AFB smear + Culture w reflexed sensitivities panel)  Once,   R       See Hyperspace for full Linked Orders Report.   08/26/22 1223            Signed, Terrilee Croak, MD [facility] 08/27/2022

## 2022-08-28 DIAGNOSIS — J441 Chronic obstructive pulmonary disease with (acute) exacerbation: Secondary | ICD-10-CM | POA: Diagnosis not present

## 2022-08-28 MED ORDER — DOXYCYCLINE HYCLATE 100 MG PO TABS: 100.0000 mg | ORAL_TABLET | Freq: Two times a day (BID) | ORAL | 0 refills | Status: DC

## 2022-08-28 MED ORDER — GUAIFENESIN ER 600 MG PO TB12: 1200.0000 mg | ORAL_TABLET | Freq: Two times a day (BID) | ORAL | 0 refills | Status: AC

## 2022-08-28 MED ORDER — PREDNISONE 10 MG PO TABS: ORAL_TABLET | ORAL | 0 refills | Status: DC

## 2022-08-28 NOTE — Progress Notes (Signed)
Discharge instructions provided to and reviewed with patient and patient's daughter.  Both verbalized understanding.  PIV and cardiac monitoring removed.  Patient transported to main entrance via wheelchair with belongings for transport home with daughter.  Angie Fava, RN

## 2022-08-28 NOTE — Plan of Care (Signed)
  Problem: Health Behavior/Discharge Planning: Goal: Ability to manage health-related needs will improve Outcome: Progressing   Problem: Clinical Measurements: Goal: Ability to maintain clinical measurements within normal limits will improve Outcome: Progressing   Problem: Nutrition: Goal: Adequate nutrition will be maintained Outcome: Progressing   

## 2022-08-28 NOTE — Plan of Care (Signed)
  Problem: Education: Goal: Knowledge of General Education information will improve Description: Including pain rating scale, medication(s)/side effects and non-pharmacologic comfort measures Outcome: Adequate for Discharge   Problem: Health Behavior/Discharge Planning: Goal: Ability to manage health-related needs will improve Outcome: Adequate for Discharge   Problem: Clinical Measurements: Goal: Ability to maintain clinical measurements within normal limits will improve Outcome: Adequate for Discharge Goal: Will remain free from infection Outcome: Adequate for Discharge   Problem: Nutrition: Goal: Adequate nutrition will be maintained Outcome: Adequate for Discharge

## 2022-08-28 NOTE — Discharge Summary (Signed)
Physician Discharge Summary  Sydney Gross [id number] DOB: 10-Jul-1930 DOA: 08/24/2022  PCP: Mayra Neer, MD  Admit date: 08/24/2022 Discharge date: 08/28/2022  Admitted From: Home Discharge disposition: Home  Recommendations at discharge:  Complete 2 weeks course of doxycycline, long tapering course of prednisone Continue inhalers, Mucinex, incentive spirometry Continue other home meds as before `  Brief narrative: Sydney Gross is a 88 y.o. female with PMH significant for HTN, HLD, PAD, CAD, paroxysmal A-fib on Eliquis, diastolic CHF, COPD, pulmonary hypertension, chronic low back pain, history of GI bleed. Patient presented to the ED on 9/19 with complaint of shortness of breath progressive worsening for 2 weeks.  She was recently hospitalized 9/11 to 9/12 for COPD exacerbation and discharged on steroids.  She states that her symptoms initially felt improved but recurred again and would only responded partially to nebulizers.  With persistent of symptoms, patient presented to the ED on 9/19.  In the ED, patient was afebrile, hemodynamically stable, breathing in the 20s and required 2 L of oxygen by nasal cannula. Labs with WBC count 18, hemoglobin at 13.1, BUN/creatinine elevated to 43/1.22 Respiratory virus panel negative for COVID, influenza Chest imaging showed small patchy densities in right lower lung fields suggesting scarring or atelectasis or pneumonia. Small faint ground-glass densities are seen in left upper lobe, possibly scarring or interstitial pneumonia.   Patient initially required BiPAP in the ED and was later switched to oxygen by nasal cannula Admitted to hospitalist service for exacerbation  Subjective: Patient was seen and examined this morning.   Lying on bed not in distress.  Cough improving.  Lung auscultation much better.  Able to ambulate in the hallway without supplemental oxygen.  Feels ready to go home today.  Assessment and plan: Acute  COPD exacerbation Multifocal pneumonia/bronchiolitis Presented with progressive worsening shortness of breath for 2 weeks.   CT chest as above showed patchy densities in right lower lobe and left upper lobe She was started on IV Rocephin, oral doxycycline, IV Solu-Medrol.  Clinically improving and not requiring supplemental oxygen to ambulate on the hallway.  Feels comfortable for discharge today. 9/21, pulm consult appreciated.  Antibiotics was switched to oral doxycycline.  Per pulmonary recommendation, discharge on 2 weeks course of doxycycline, long tapering course of prednisone. Continue inhalers as recommended by PCCM. Continue Mucinex for cough, flutter valve Follow-up with pulmonology as an outpatient. Recent Labs  Lab 08/24/22 1150 08/25/22 0358 08/25/22 1000 08/26/22 0443 08/27/22 0422  WBC 18.0* 11.3*  --  16.8*  --   PROCALCITON  --   --  <0.10 <0.10 <0.10   Acute respiratory failure with hypoxia Initially required BiPAP.  Able to wean off oxygen gradually.  This morning, patient was able to ambulate in the hallway without supplemental oxygen.  Hypotension History of HTN, Chronic diastolic CHF Blood pressure was as low in 60s last night.  IV fluid bolus was given.  Started on maintenance fluid. Blood pressure improving. PTA on metoprolol 25 mg daily, lisinopril 10 mg daily, HCTZ 12.5 mg daily Continue all  AKI Baseline creatinine normal.  Initially, creatinine was elevated 1.22.  Improved with IV fluid.   Renal ultrasound unremarkable for any obstruction. Recent Labs    10/06/21 0323 06/16/22 1643 06/16/22 1654 06/17/22 0522 06/18/22 0445 08/16/22 1132 08/17/22 0509 08/24/22 1150 08/25/22 0358 08/26/22 0443  BUN 31* 33* 32* 22 9 31* 35* 43* 45* 37*  CREATININE 1.10* 1.57* 1.60* 0.96 0.93 1.06* 0.94 1.22* 1.14* 0.95   PAF  Previously on metoprolol and Eliquis    CAD/HLD Previously on metoprolol, Repatha, Zetia  Muscle spasm Tizanidine BID as  needed  Pulmonary nodules CT chest showed few small nodular densities in the lung fields largest measuring 6 mm in size in left upper lobe.  Radiologist recommended non-contrast chest CT at 3-6 months.  To follow-up with PCP    Wounds:  - Wound / Incision (Open or Dehisced) 06/17/22 Non-pressure wound Pretibial Left;Lower;Mid (Active)  Date First Assessed/Time First Assessed: 06/17/22 1807   Wound Type: Non-pressure wound  Location: Pretibial  Location Orientation: Left;Lower;Mid  Present on Admission: Yes    Assessments 06/17/2022  6:07 PM 06/17/2022  8:18 PM  Dressing Type Foam - Lift dressing to assess site every shift;Impregnated gauze (bismuth) Foam - Lift dressing to assess site every shift  Dressing Changed New --  Dressing Status Clean, Dry, Intact --  Dressing Change Frequency Daily --  Site / Wound Assessment -- Dressing in place / Unable to assess  Wound Length (cm) 7 cm --  Wound Width (cm) 5 cm --  Wound Depth (cm) 0 cm --  Wound Volume (cm^3) 0 cm^3 --  Wound Surface Area (cm^2) 35 cm^2 --  Drainage Amount Minimal --  Drainage Description Purulent --  Treatment Cleansed Other (Comment)     No associated orders.    Discharge Exam:   Vitals:   08/28/22 0250 08/28/22 0404 08/28/22 0830 08/28/22 1302  BP:  119/71  (!) 140/88  Pulse:  76  79  Resp:  18  18  Temp:  97.9 F (36.6 C)  98.6 F (37 C)  TempSrc:  Oral  Oral  SpO2: 97% 96% 93% 98%  Weight:  49.4 kg    Height:        Body mass index is 22 kg/m.   General exam: Pleasant, elderly Caucasian female. Skin: No rashes, lesions or ulcers. HEENT: Atraumatic, normocephalic, no obvious bleeding Lungs: Much better on auscultation.  No wheezing, no cough on deep breathing today. CVS: Regular rate and rhythm, no murmur GI/Abd soft, nontender, nondistended, bowel sound present CNS: Alert, awake, oriented x3 Psychiatry: Mood appropriate Extremities: No pedal edema, no calf tenderness  Follow ups:     Follow-up Information     Mayra Neer, MD Follow up.   Specialty: Family Medicine Contact information: [address] [place] [address] [phone]         Belva Crome, MD .   Specialty: Cardiology Contact information: [phone] [address] [place] [address] [phone]                 Discharge Instructions:   Discharge Instructions     Call MD for:  difficulty breathing, headache or visual disturbances   Complete by: As directed    Call MD for:  extreme fatigue   Complete by: As directed    Call MD for:  hives   Complete by: As directed    Call MD for:  persistant dizziness or light-headedness   Complete by: As directed    Call MD for:  persistant nausea and vomiting   Complete by: As directed    Call MD for:  severe uncontrolled pain   Complete by: As directed    Call MD for:  temperature >100.4   Complete by: As directed    Diet general   Complete by: As directed    Discharge instructions   Complete by: As directed    Recommendations  at discharge:   Complete 2 weeks course of doxycycline, long tapering course of prednisone  Continue inhalers, Mucinex, incentive spirometry  Continue other home meds as before  General discharge instructions: Follow with Primary MD Mayra Neer, MD in 7 days  Please request your PCP  to go over your hospital tests, procedures, radiology results at the follow up. Please get your medicines reviewed and adjusted.  Your PCP may decide to repeat certain labs or tests as needed. Do not drive, operate heavy machinery, perform activities at heights, swimming or participation in water activities or provide baby sitting services if your were admitted for syncope or siezures until you have seen by Primary MD or a Neurologist and advised to do so again. [place] Controlled Substance Reporting System database was reviewed. Do not drive, operate heavy machinery, perform activities at  heights, swim, participate in water activities or provide baby-sitting services while on medications for pain, sleep and mood until your outpatient physician has reevaluated you and advised to do so again.  You are strongly recommended to comply with the dose, frequency and duration of prescribed medications. Activity: As tolerated with Full fall precautions use walker/cane & assistance as needed Avoid using any recreational substances like cigarette, tobacco, alcohol, or non-prescribed drug. If you experience worsening of your admission symptoms, develop shortness of breath, life threatening emergency, suicidal or homicidal thoughts you must seek medical attention immediately by calling 911 or calling your MD immediately  if symptoms less severe. You must read complete instructions/literature along with all the possible adverse reactions/side effects for all the medicines you take and that have been prescribed to you. Take any new medicine only after you have completely understood and accepted all the possible adverse reactions/side effects.  Wear Seat belts while driving. You were cared for by a hospitalist during your hospital stay. If you have any questions about your discharge medications or the care you received while you were in the hospital after you are discharged, you can call the unit and ask to speak with the hospitalist or the covering physician. Once you are discharged, your primary care physician will handle any further medical issues. Please note that NO REFILLS for any discharge medications will be authorized once you are discharged, as it is imperative that you return to your primary care physician (or establish a relationship with a primary care physician if you do not have one).   Increase activity slowly   Complete by: As directed        Discharge Medications:   Allergies as of 08/28/2022       Reactions   Brilinta [ticagrelor] Shortness Of Breath   Codeine Nausea And Vomiting    Darvon [propoxyphene] Nausea Only   Lipitor [atorvastatin] Other (See Comments)   MYALGIAS   Rosuvastatin Other (See Comments)   MYALGIAS   Simvastatin Other (See Comments)   MYALGIAS   Tramadol Hcl Nausea Only   nausea (08/2019)        Medication List     STOP taking these medications    acetaminophen 650 MG CR tablet Commonly known as: TYLENOL       TAKE these medications    albuterol 108 (90 Base) MCG/ACT inhaler Commonly known as: VENTOLIN HFA Inhale 1 puff into the lungs every 6 (six) hours as needed for shortness of breath.   albuterol (2.5 MG/3ML) 0.083% nebulizer solution Commonly known as: PROVENTIL Take 3 mLs (2.5 mg total) by nebulization every 2 (two) hours as needed for wheezing  or shortness of breath.   apixaban 2.5 MG Tabs tablet Commonly known as: ELIQUIS Take 1 tablet (2.5 mg total) by mouth 2 (two) times daily.   Breztri Aerosphere 160-9-4.8 MCG/ACT Aero Generic drug: Budeson-Glycopyrrol-Formoterol Inhale 2 puffs into the lungs 2 (two) times daily as needed (Shortness of breath/ COPD).   denosumab 60 MG/ML Sosy injection Commonly known as: PROLIA Inject 60 mg as directed every 6 (six) months.   diphenhydramine-acetaminophen 25-500 MG Tabs tablet Commonly known as: TYLENOL PM Take 1 tablet by mouth at bedtime.   doxycycline 100 MG tablet Commonly known as: VIBRA-TABS Take 1 tablet (100 mg total) by mouth every 12 (twelve) hours for 14 days.   ezetimibe 10 MG tablet Commonly known as: ZETIA Take 1 tablet by mouth once daily   guaiFENesin 600 MG 12 hr tablet Commonly known as: MUCINEX Take 2 tablets (1,200 mg total) by mouth 2 (two) times daily for 7 days. What changed:  how much to take when to take this reasons to take this   ipratropium-albuterol 0.5-2.5 (3) MG/3ML Soln Commonly known as: DUONEB Take 3 mLs by nebulization 3 (three) times daily.   lisinopril-hydrochlorothiazide 20-25 MG tablet Commonly known as:  ZESTORETIC Take 0.5 tablets by mouth in the morning. What changed: when to take this   metoprolol succinate 25 MG 24 hr tablet Commonly known as: TOPROL-XL Take 1 tablet (25 mg total) by mouth daily. Pt needs to make appt to see provider for further refills - 2nd attempt   metoprolol tartrate 25 MG tablet Commonly known as: LOPRESSOR Take one half - one tablet (12.5 mg - 25 mg) as needed for Heart Rate 100 or above. What changed:  how much to take how to take this when to take this reasons to take this additional instructions   nitroGLYCERIN 0.4 MG SL tablet Commonly known as: NITROSTAT Place 0.4 mg under the tongue every 5 (five) minutes as needed for chest pain.   predniSONE 10 MG tablet Commonly known as: DELTASONE Tak 4 tabs daily for 5 days, then take 3 tabs daily for 5 days, then take 2 tabs daily for 5 days, then take 1 tab daily for 5 days, then STOP.   Repatha SureClick [id number] MG/ML Soaj Generic drug: Evolocumab INJECT 1 PEN INTO THE SKIN EVERY 14 DAYS What changed: See the new instructions.   SLOW-MAG PO Take 1 tablet by mouth 2 (two) times daily.   tiZANidine 4 MG capsule Commonly known as: ZANAFLEX Take 4 mg by mouth daily as needed for muscle spasms.         The results of significant diagnostics from this hospitalization (including imaging, microbiology, ancillary and laboratory) are listed below for reference.    Procedures and Diagnostic Studies:   US RENAL  Result Date: 08/24/2022 CLINICAL DATA:  Acute renal injury EXAM: RENAL / URINARY TRACT ULTRASOUND COMPLETE COMPARISON:  06/17/2022 CT FINDINGS: Right Kidney: Renal measurements: 9.9 x 4.1 x 5.8 cm. = volume: 125 mL. No mass lesion or hydronephrosis is noted. 4.3 cm cyst is noted arising from the lower pole of the right kidney stable in appearance from the prior CT. Left Kidney: Renal measurements: 8.1 x 3.5 x 4.0 cm. = volume: 60 mL. Echogenicity within normal limits. No mass or hydronephrosis  visualized. Bladder: Decompressed Other: None. IMPRESSION: Slightly smaller left kidney. Right renal cyst simple in appearance stable from prior CT. No further follow-up is recommended. Electronically Signed   By: Inez Catalina M.D.   On: 08/24/2022 19:34  CT CHEST WO CONTRAST  Result Date: 08/24/2022 CLINICAL DATA:  Respiratory illness, cough EXAM: CT CHEST WITHOUT CONTRAST TECHNIQUE: Multidetector CT imaging of the chest was performed following the standard protocol without IV contrast. RADIATION DOSE REDUCTION: This exam was performed according to the departmental dose-optimization program which includes automated exposure control, adjustment of the mA and/or kV according to patient size and/or use of iterative reconstruction technique. COMPARISON:  Previous studies including chest radiograph done earlier today FINDINGS: Cardiovascular: Calcifications are seen in thoracic aorta and coronary arteries. There is ectasia of ascending thoracic aorta measuring 3.8 cm. Mediastinum/Nodes: No significant lymphadenopathy is seen in mediastinum. Thyroid is smaller than usual in size. Lungs/Pleura: Small linear densities are seen in right lower lung field suggesting scarring or subsegmental atelectasis. There is mild ectasia of bronchi in the right lower lung field. There is no focal pulmonary consolidation. In image 70 of series 5, there is 6 mm faint nodule in left upper lobe. In image 76, small faint ground-glass opacity seen in left upper lobe. In image 68, there is a faint 3 mm nodule in right lower lobe. Upper Abdomen: There is possible small angiomyolipoma in left kidney. Musculoskeletal: No acute findings are seen. IMPRESSION: Small patchy densities are seen in right lower lung fields suggesting scarring or atelectasis or pneumonia. Small faint ground-glass densities are seen in left upper lobe, possibly scarring or interstitial pneumonia. There are few small nodular densities in the lung fields largest measuring  6 mm in size in left upper lobe. Non-contrast chest CT at 3-6 months is recommended. If the nodules are stable at time of repeat CT, then future CT at 18-24 months (from today's scan) is considered optional for low-risk patients, but is recommended for high-risk patients. This recommendation follows the consensus statement: Guidelines for Management of Incidental Pulmonary Nodules Detected on CT Images: From the Fleischner Society 2017; Radiology 2017; 284:228-243. Coronary artery disease. There is ectasia of ascending thoracic aorta measuring 3.8 cm. Recommend annual imaging followup by CTA or MRA. This recommendation follows 2010 ACCF/AHA/AATS/ACR/ASA/SCA/SCAI/SIR/STS/SVM Guidelines for the Diagnosis and Management of Patients with Thoracic Aortic Disease. Circulation. 2010; [id number]:4928372. Aortic aneurysm NOS (ICD10-I71.9) Other findings as described in the body of the report. Electronically Signed   By: Elmer Picker M.D.   On: 08/24/2022 18:55   DG Chest Portable 1 View  Result Date: 08/24/2022 CLINICAL DATA:  Shortness of breath and productive cough, COPD EXAM: PORTABLE CHEST 1 VIEW COMPARISON:  08/16/2022 FINDINGS: The heart size and mediastinal contours are unchanged. Aortic atherosclerotic disease. No focal pulmonary opacity. No pleural effusion or pneumothorax. Scoliosis. No acute osseous abnormality. IMPRESSION: No acute cardiopulmonary process. Electronically Signed   By: Merilyn Baba M.D.   On: 08/24/2022 12:08     Labs:   Basic Metabolic Panel: Recent Labs  Lab 08/24/22 1150 08/25/22 0358 08/25/22 1000 08/26/22 0443  NA 143 143  --  142  K 3.8 4.4  --  4.7  CL 104 112*  --  112*  CO2 25 24  --  23  GLUCOSE 109* 136*  --  144*  BUN 43* 45*  --  37*  CREATININE 1.22* 1.14*  --  0.95  CALCIUM 10.0 9.0  --  9.2  MG  --   --  2.1  --    GFR Estimated Creatinine Clearance: 26.8 mL/min (by C-G formula based on SCr of 0.95 mg/dL). Liver Function Tests: Recent Labs  Lab  08/25/22 0358  AST 14*  ALT 14  ALKPHOS 38  BILITOT 0.7  PROT 5.2*  ALBUMIN 3.1*   No results for input(s): "LIPASE", "AMYLASE" in the last 168 hours. No results for input(s): "AMMONIA" in the last 168 hours. Coagulation profile No results for input(s): "INR", "PROTIME" in the last 168 hours.  CBC: Recent Labs  Lab 08/24/22 1150 08/25/22 0358 08/26/22 0443  WBC 18.0* 11.3* 16.8*  NEUTROABS 14.3*  --  14.7*  HGB 13.1 10.0* 10.6*  HCT 40.1 31.6* 32.4*  MCV 102.6* 107.8* 110.2*  PLT 313 233 230   Cardiac Enzymes: No results for input(s): "CKTOTAL", "CKMB", "CKMBINDEX", "TROPONINI" in the last 168 hours. BNP: Invalid input(s): "POCBNP" CBG: No results for input(s): "GLUCAP" in the last 168 hours. D-Dimer No results for input(s): "DDIMER" in the last 72 hours. Hgb A1c No results for input(s): "HGBA1C" in the last 72 hours. Lipid Profile No results for input(s): "CHOL", "HDL", "LDLCALC", "TRIG", "CHOLHDL", "LDLDIRECT" in the last 72 hours. Thyroid function studies No results for input(s): "TSH", "T4TOTAL", "T3FREE", "THYROIDAB" in the last 72 hours.  Invalid input(s): "FREET3" Anemia work up No results for input(s): "VITAMINB12", "FOLATE", "FERRITIN", "TIBC", "IRON", "RETICCTPCT" in the last 72 hours. Microbiology Recent Results (from the past 240 hour(s))  SARS Coronavirus 2 by RT PCR (hospital order, performed in [facility] hospital lab) *cepheid single result test* Anterior Nasal Swab     Status: None   Collection Time: 08/24/22 11:50 AM   Specimen: Anterior Nasal Swab  Result Value Ref Range Status   SARS Coronavirus 2 by RT PCR NEGATIVE NEGATIVE Final    Comment: (NOTE) SARS-CoV-2 target nucleic acids are NOT DETECTED.  The SARS-CoV-2 RNA is generally detectable in upper and lower respiratory specimens during the acute phase of infection. The lowest concentration of SARS-CoV-2 viral copies this assay can detect is 250 copies / mL. A negative result does not  preclude SARS-CoV-2 infection and should not be used as the sole basis for treatment or other patient management decisions.  A negative result may occur with improper specimen collection / handling, submission of specimen other than nasopharyngeal swab, presence of viral mutation(s) within the areas targeted by this assay, and inadequate number of viral copies (<250 copies / mL). A negative result must be combined with clinical observations, patient history, and epidemiological information.  Fact Sheet for Patients:   https://www.patel.info/  Fact Sheet for Healthcare Providers: https://hall.com/  This test is not yet approved or  cleared by the [place] FDA and has been authorized for detection and/or diagnosis of SARS-CoV-2 by FDA under an Emergency Use Authorization (EUA).  This EUA will remain in effect (meaning this test can be used) for the duration of the COVID-19 declaration under Section 564(b)(1) of the Act, 21 U.S.C. section 360bbb-3(b)(1), unless the authorization is terminated or revoked sooner.  Performed at [facility], [address] [place],  [address]   Respiratory (~20 pathogens) panel by PCR     Status: None   Collection Time: 08/24/22  5:37 PM   Specimen: Nasopharyngeal Swab; Respiratory  Result Value Ref Range Status   Adenovirus NOT DETECTED NOT DETECTED Final   Coronavirus 229E NOT DETECTED NOT DETECTED Final    Comment: (NOTE) The Coronavirus on the Respiratory Panel, DOES NOT test for the novel  Coronavirus (2019 nCoV)    Coronavirus HKU1 NOT DETECTED NOT DETECTED Final   Coronavirus NL63 NOT DETECTED NOT DETECTED Final   Coronavirus OC43 NOT DETECTED NOT DETECTED Final   Metapneumovirus NOT DETECTED NOT DETECTED Final  Rhinovirus / Enterovirus NOT DETECTED NOT DETECTED Final   Influenza A NOT DETECTED NOT DETECTED Final   Influenza B NOT DETECTED NOT DETECTED Final    Parainfluenza Virus 1 NOT DETECTED NOT DETECTED Final   Parainfluenza Virus 2 NOT DETECTED NOT DETECTED Final   Parainfluenza Virus 3 NOT DETECTED NOT DETECTED Final   Parainfluenza Virus 4 NOT DETECTED NOT DETECTED Final   Respiratory Syncytial Virus NOT DETECTED NOT DETECTED Final   Bordetella pertussis NOT DETECTED NOT DETECTED Final   Bordetella Parapertussis NOT DETECTED NOT DETECTED Final   Chlamydophila pneumoniae NOT DETECTED NOT DETECTED Final   Mycoplasma pneumoniae NOT DETECTED NOT DETECTED Final    Comment: Performed at [facility], [place] [address], [place], [place] [address]  Expectorated Sputum Assessment w Gram Stain, Rflx to Resp Cult     Status: None   Collection Time: 08/26/22  7:58 PM   Specimen: Sputum  Result Value Ref Range Status   Specimen Description SPUTUM  Final   Special Requests NONE  Final   Sputum evaluation   Final    THIS SPECIMEN IS ACCEPTABLE FOR SPUTUM CULTURE Performed at [facility], [place] [address], [place], [place] [address]    Report Status 08/26/2022 FINAL  Final  Culture, Respiratory w Gram Stain     Status: None (Preliminary result)   Collection Time: 08/26/22  7:58 PM   Specimen: SPU  Result Value Ref Range Status   Specimen Description   Final    SPUTUM Performed at [facility] [address] [place], [place] [address]    Special Requests   Final    NONE Reflexed from [id number] Performed at [facility], [place] [address] [place], [place] [address]    Gram Stain   Final    FEW WBC PRESENT, PREDOMINANTLY PMN FEW GRAM NEGATIVE RODS RARE GRAM POSITIVE RODS RARE GRAM POSITIVE COCCI IN PAIRS RARE SQUAMOUS EPITHELIAL CELLS PRESENT    Culture   Final    CULTURE REINCUBATED FOR BETTER GROWTH Performed at [facility], [place] [address] [place], [place] [address]    Report Status PENDING  Incomplete    Time coordinating discharge: 35 minutes  Signed: Preciliano Castell  [facility] 08/28/2022, 1:57 PM

## 2022-08-29 LAB — CULTURE, RESPIRATORY W GRAM STAIN: Culture: NORMAL

## 2022-08-29 LAB — ACID FAST SMEAR (AFB, MYCOBACTERIA): Acid Fast Smear: NEGATIVE

## 2022-09-10 ENCOUNTER — Emergency Department (HOSPITAL_BASED_OUTPATIENT_CLINIC_OR_DEPARTMENT_OTHER): Payer: Medicare Other

## 2022-09-10 ENCOUNTER — Encounter (HOSPITAL_COMMUNITY): Payer: Self-pay

## 2022-09-10 ENCOUNTER — Encounter (HOSPITAL_BASED_OUTPATIENT_CLINIC_OR_DEPARTMENT_OTHER): Payer: Self-pay | Admitting: Emergency Medicine

## 2022-09-10 ENCOUNTER — Inpatient Hospital Stay (HOSPITAL_BASED_OUTPATIENT_CLINIC_OR_DEPARTMENT_OTHER)
Admission: EM | Admit: 2022-09-10 | Discharge: 2022-09-15 | DRG: 378 | Disposition: A | Discharge status: Home / Self Care | Payer: Medicare Other | Attending: Family Medicine | Admitting: Family Medicine

## 2022-09-10 ENCOUNTER — Other Ambulatory Visit: Payer: Self-pay

## 2022-09-10 DIAGNOSIS — Z1152 Encounter for screening for COVID-19: Secondary | ICD-10-CM

## 2022-09-10 DIAGNOSIS — R110 Nausea: Secondary | ICD-10-CM | POA: Diagnosis not present

## 2022-09-10 DIAGNOSIS — I2489 Other forms of acute ischemic heart disease: Secondary | ICD-10-CM | POA: Diagnosis present

## 2022-09-10 DIAGNOSIS — Z66 Do not resuscitate: Secondary | ICD-10-CM | POA: Diagnosis not present

## 2022-09-10 DIAGNOSIS — I340 Nonrheumatic mitral (valve) insufficiency: Secondary | ICD-10-CM | POA: Diagnosis present

## 2022-09-10 DIAGNOSIS — K264 Chronic or unspecified duodenal ulcer with hemorrhage: Secondary | ICD-10-CM | POA: Diagnosis present

## 2022-09-10 DIAGNOSIS — I5032 Chronic diastolic (congestive) heart failure: Secondary | ICD-10-CM | POA: Diagnosis not present

## 2022-09-10 DIAGNOSIS — R197 Diarrhea, unspecified: Secondary | ICD-10-CM | POA: Diagnosis not present

## 2022-09-10 DIAGNOSIS — Z7901 Long term (current) use of anticoagulants: Secondary | ICD-10-CM | POA: Diagnosis not present

## 2022-09-10 DIAGNOSIS — R112 Nausea with vomiting, unspecified: Secondary | ICD-10-CM

## 2022-09-10 DIAGNOSIS — I2510 Atherosclerotic heart disease of native coronary artery without angina pectoris: Secondary | ICD-10-CM | POA: Diagnosis not present

## 2022-09-10 DIAGNOSIS — E861 Hypovolemia: Secondary | ICD-10-CM | POA: Diagnosis not present

## 2022-09-10 DIAGNOSIS — K257 Chronic gastric ulcer without hemorrhage or perforation: Secondary | ICD-10-CM | POA: Diagnosis not present

## 2022-09-10 DIAGNOSIS — Z955 Presence of coronary angioplasty implant and graft: Secondary | ICD-10-CM

## 2022-09-10 DIAGNOSIS — I480 Paroxysmal atrial fibrillation: Secondary | ICD-10-CM | POA: Diagnosis not present

## 2022-09-10 DIAGNOSIS — I509 Heart failure, unspecified: Secondary | ICD-10-CM | POA: Diagnosis not present

## 2022-09-10 DIAGNOSIS — K922 Gastrointestinal hemorrhage, unspecified: Secondary | ICD-10-CM | POA: Diagnosis present

## 2022-09-10 DIAGNOSIS — I110 Hypertensive heart disease with heart failure: Secondary | ICD-10-CM | POA: Diagnosis not present

## 2022-09-10 DIAGNOSIS — R778 Other specified abnormalities of plasma proteins: Secondary | ICD-10-CM | POA: Diagnosis not present

## 2022-09-10 DIAGNOSIS — E871 Hypo-osmolality and hyponatremia: Secondary | ICD-10-CM | POA: Diagnosis not present

## 2022-09-10 DIAGNOSIS — R42 Dizziness and giddiness: Secondary | ICD-10-CM | POA: Diagnosis not present

## 2022-09-10 DIAGNOSIS — R7989 Other specified abnormal findings of blood chemistry: Secondary | ICD-10-CM | POA: Diagnosis present

## 2022-09-10 DIAGNOSIS — D62 Acute posthemorrhagic anemia: Secondary | ICD-10-CM | POA: Diagnosis present

## 2022-09-10 DIAGNOSIS — I739 Peripheral vascular disease, unspecified: Secondary | ICD-10-CM | POA: Diagnosis not present

## 2022-09-10 DIAGNOSIS — K259 Gastric ulcer, unspecified as acute or chronic, without hemorrhage or perforation: Secondary | ICD-10-CM | POA: Diagnosis not present

## 2022-09-10 DIAGNOSIS — Z825 Family history of asthma and other chronic lower respiratory diseases: Secondary | ICD-10-CM

## 2022-09-10 DIAGNOSIS — I351 Nonrheumatic aortic (valve) insufficiency: Secondary | ICD-10-CM | POA: Diagnosis not present

## 2022-09-10 DIAGNOSIS — Z23 Encounter for immunization: Secondary | ICD-10-CM | POA: Diagnosis not present

## 2022-09-10 DIAGNOSIS — Z9049 Acquired absence of other specified parts of digestive tract: Secondary | ICD-10-CM

## 2022-09-10 DIAGNOSIS — R109 Unspecified abdominal pain: Secondary | ICD-10-CM | POA: Diagnosis not present

## 2022-09-10 DIAGNOSIS — I272 Pulmonary hypertension, unspecified: Secondary | ICD-10-CM | POA: Diagnosis present

## 2022-09-10 DIAGNOSIS — D500 Iron deficiency anemia secondary to blood loss (chronic): Secondary | ICD-10-CM | POA: Diagnosis not present

## 2022-09-10 DIAGNOSIS — N1831 Chronic kidney disease, stage 3a: Secondary | ICD-10-CM | POA: Diagnosis present

## 2022-09-10 DIAGNOSIS — Z7722 Contact with and (suspected) exposure to environmental tobacco smoke (acute) (chronic): Secondary | ICD-10-CM | POA: Diagnosis not present

## 2022-09-10 DIAGNOSIS — K5730 Diverticulosis of large intestine without perforation or abscess without bleeding: Secondary | ICD-10-CM | POA: Diagnosis not present

## 2022-09-10 DIAGNOSIS — Z90710 Acquired absence of both cervix and uterus: Secondary | ICD-10-CM

## 2022-09-10 DIAGNOSIS — K254 Chronic or unspecified gastric ulcer with hemorrhage: Secondary | ICD-10-CM | POA: Diagnosis not present

## 2022-09-10 DIAGNOSIS — K2990 Gastroduodenitis, unspecified, without bleeding: Secondary | ICD-10-CM | POA: Diagnosis not present

## 2022-09-10 DIAGNOSIS — Z79899 Other long term (current) drug therapy: Secondary | ICD-10-CM

## 2022-09-10 DIAGNOSIS — J449 Chronic obstructive pulmonary disease, unspecified: Secondary | ICD-10-CM | POA: Diagnosis not present

## 2022-09-10 DIAGNOSIS — E785 Hyperlipidemia, unspecified: Secondary | ICD-10-CM | POA: Diagnosis present

## 2022-09-10 DIAGNOSIS — Z885 Allergy status to narcotic agent status: Secondary | ICD-10-CM

## 2022-09-10 DIAGNOSIS — K921 Melena: Secondary | ICD-10-CM | POA: Diagnosis present

## 2022-09-10 DIAGNOSIS — D509 Iron deficiency anemia, unspecified: Secondary | ICD-10-CM | POA: Diagnosis not present

## 2022-09-10 DIAGNOSIS — Z823 Family history of stroke: Secondary | ICD-10-CM | POA: Diagnosis not present

## 2022-09-10 DIAGNOSIS — D72829 Elevated white blood cell count, unspecified: Secondary | ICD-10-CM | POA: Diagnosis not present

## 2022-09-10 DIAGNOSIS — N281 Cyst of kidney, acquired: Secondary | ICD-10-CM | POA: Diagnosis not present

## 2022-09-10 DIAGNOSIS — R079 Chest pain, unspecified: Principal | ICD-10-CM

## 2022-09-10 DIAGNOSIS — I350 Nonrheumatic aortic (valve) stenosis: Secondary | ICD-10-CM | POA: Diagnosis not present

## 2022-09-10 DIAGNOSIS — K279 Peptic ulcer, site unspecified, unspecified as acute or chronic, without hemorrhage or perforation: Secondary | ICD-10-CM | POA: Diagnosis not present

## 2022-09-10 DIAGNOSIS — G8929 Other chronic pain: Secondary | ICD-10-CM | POA: Diagnosis present

## 2022-09-10 DIAGNOSIS — Z888 Allergy status to other drugs, medicaments and biological substances status: Secondary | ICD-10-CM

## 2022-09-10 DIAGNOSIS — M545 Low back pain, unspecified: Secondary | ICD-10-CM | POA: Diagnosis present

## 2022-09-10 DIAGNOSIS — M1990 Unspecified osteoarthritis, unspecified site: Secondary | ICD-10-CM | POA: Diagnosis present

## 2022-09-10 DIAGNOSIS — R0789 Other chest pain: Secondary | ICD-10-CM | POA: Diagnosis not present

## 2022-09-10 LAB — COMPREHENSIVE METABOLIC PANEL
ALT: 19 U/L (ref 0–44)
AST: 18 U/L (ref 15–41)
Albumin: 4 g/dL (ref 3.5–5.0)
Alkaline Phosphatase: 56 U/L (ref 38–126)
Anion gap: 11 (ref 5–15)
BUN: 41 mg/dL — ABNORMAL HIGH (ref 8–23)
CO2: 27 mmol/L (ref 22–32)
Calcium: 9 mg/dL (ref 8.9–10.3)
Chloride: 93 mmol/L — ABNORMAL LOW (ref 98–111)
Creatinine, Ser: 1.06 mg/dL — ABNORMAL HIGH (ref 0.44–1.00)
GFR, Estimated: 50 mL/min — ABNORMAL LOW (ref >60)
Glucose, Bld: 97 mg/dL (ref 70–99)
Potassium: 4.5 mmol/L (ref 3.5–5.1)
Sodium: 131 mmol/L — ABNORMAL LOW (ref 135–145)
Total Bilirubin: 0.9 mg/dL (ref 0.3–1.2)
Total Protein: 6.6 g/dL (ref 6.5–8.1)

## 2022-09-10 LAB — CBC WITH DIFFERENTIAL/PLATELET
Abs Immature Granulocytes: 0.08 10*3/uL — ABNORMAL HIGH (ref 0.00–0.07)
Basophils Absolute: 0 10*3/uL (ref 0.0–0.1)
Basophils Relative: 0 %
Eosinophils Absolute: 0 10*3/uL (ref 0.0–0.5)
Eosinophils Relative: 0 %
HCT: 39.1 % (ref 36.0–46.0)
Hemoglobin: 13.2 g/dL (ref 12.0–15.0)
Immature Granulocytes: 1 %
Lymphocytes Relative: 25 %
Lymphs Abs: 4.2 10*3/uL — ABNORMAL HIGH (ref 0.7–4.0)
MCH: 33.8 pg (ref 26.0–34.0)
MCHC: 33.8 g/dL (ref 30.0–36.0)
MCV: 100 fL (ref 80.0–100.0)
Monocytes Absolute: 1.3 10*3/uL — ABNORMAL HIGH (ref 0.1–1.0)
Monocytes Relative: 7 %
Neutro Abs: 11.7 10*3/uL — ABNORMAL HIGH (ref 1.7–7.7)
Neutrophils Relative %: 67 %
Platelets: 218 10*3/uL (ref 150–400)
RBC: 3.91 MIL/uL (ref 3.87–5.11)
RDW: 13.5 % (ref 11.5–15.5)
WBC: 17.3 10*3/uL — ABNORMAL HIGH (ref 4.0–10.5)
nRBC: 0 % (ref 0.0–0.2)

## 2022-09-10 LAB — TROPONIN I (HIGH SENSITIVITY)
Troponin I (High Sensitivity): 155 ng/L (ref <18)
Troponin I (High Sensitivity): 135 ng/L (ref <18)

## 2022-09-10 LAB — LIPASE, BLOOD: Lipase: 58 U/L — ABNORMAL HIGH (ref 11–51)

## 2022-09-10 LAB — RESP PANEL BY RT-PCR (FLU A&B, COVID) ARPGX2
Influenza A by PCR: NEGATIVE
Influenza B by PCR: NEGATIVE
SARS Coronavirus 2 by RT PCR: NEGATIVE

## 2022-09-10 LAB — MAGNESIUM: Magnesium: 2.2 mg/dL (ref 1.7–2.4)

## 2022-09-10 MED ORDER — SODIUM CHLORIDE 0.9 % IV BOLUS
1000.0000 mL | Freq: Once | INTRAVENOUS | Status: AC
  Administered 2022-09-10: 1000 mL via INTRAVENOUS

## 2022-09-10 MED ORDER — MECLIZINE HCL 25 MG PO TABS
25.0000 mg | ORAL_TABLET | Freq: Once | ORAL | Status: AC
  Administered 2022-09-10: 25 mg via ORAL
  Filled 2022-09-10: qty 1

## 2022-09-10 MED ORDER — ASPIRIN 81 MG PO CHEW
324.0000 mg | CHEWABLE_TABLET | Freq: Once | ORAL | Status: AC
  Administered 2022-09-10: 324 mg via ORAL
  Filled 2022-09-10: qty 4

## 2022-09-10 MED ORDER — METOCLOPRAMIDE HCL 5 MG/ML IJ SOLN
5.0000 mg | Freq: Once | INTRAMUSCULAR | Status: AC
  Administered 2022-09-10: 5 mg via INTRAVENOUS
  Filled 2022-09-10: qty 2

## 2022-09-10 MED ORDER — DIPHENHYDRAMINE HCL 50 MG/ML IJ SOLN
12.5000 mg | Freq: Once | INTRAMUSCULAR | Status: AC
  Administered 2022-09-10: 12.5 mg via INTRAVENOUS
  Filled 2022-09-10: qty 1

## 2022-09-10 MED ORDER — ONDANSETRON HCL 4 MG/2ML IJ SOLN
4.0000 mg | Freq: Once | INTRAMUSCULAR | Status: DC
  Filled 2022-09-10: qty 2

## 2022-09-10 MED ORDER — IOHEXOL 350 MG/ML SOLN
100.0000 mL | Freq: Once | INTRAVENOUS | Status: AC | PRN
  Administered 2022-09-10: 75 mL via INTRAVENOUS

## 2022-09-10 NOTE — Progress Notes (Deleted)
Synopsis: Referred in September 2023 for presumed COPD after hospitalization earlier that month for a COPD exacerbation in the setting of a viral infection.  COVID testing was negative.  Subjective:   PATIENT ID: Sydney Gross GENDER: female DOB: 28-Mar-1930, MRN: [id number]   HPI  No chief complaint on file.   *** Record review: The patient was hospitalized in September 2023 for presumed COPD exacerbation due to a virus.  COVID testing was negative.  She was treated with IV steroids and bronchodilators.  Past Medical History:  Diagnosis Date   Allergic rhinitis    Arthritis    "maybe a little in my knees and back" (07/17/2018)   Chronic lower back pain    Colitis 12/2013   "never had this before"   COPD (chronic obstructive pulmonary disease) ([place])    "questionable/Dr. Tamala Julian" (07/17/2018)   Coronary artery disease    GI bleed    H/O calcium pyrophosphate deposition disease (CPPD)    Hiatal hernia    Hyperlipidemia    Hypertension    Mild mitral regurgitation    On home oxygen therapy    "I've used it 2 times in 2 yrs" (07/17/2018)   PAD (peripheral artery disease) ([facility])    PAF (paroxysmal atrial fibrillation) (H[facility])    Pulmonary hypertension ([place])    Shingles      Family History  Problem Relation Age of Onset   Emphysema Father 54   CVA Mother    Cancer Brother        ? type     Social History   Socioeconomic History   Marital status: Married    Spouse name: Meda Coffee   Number of children: Not on file   Years of education: 12   Highest education level: High school graduate  Occupational History   Occupation: Retired  Tobacco Use   Smoking status: Never    Passive exposure: Past   Smokeless tobacco: Never  Vaping Use   Vaping Use: Never used  Substance and Sexual Activity   Alcohol use: Yes    Alcohol/week: 4.0 standard drinks of alcohol    Types: 4 Glasses of wine per week    Comment: glass of wine every other night   Drug use: Never   Sexual  activity: Not Currently  Other Topics Concern   Not on file  Social History Narrative   Not on file   Social Determinants of Health   Financial Resource Strain: Low Risk  (08/29/2018)   Overall Financial Resource Strain (CARDIA)    Difficulty of Paying Living Expenses: Not very hard  Food Insecurity: No Food Insecurity (08/24/2022)   Hunger Vital Sign    Worried About Running Out of Food in the Last Year: Never true    Ran Out of Food in the Last Year: Never true  Transportation Needs: No Transportation Needs (08/24/2022)   PRAPARE - Hydrologist (Medical): No    Lack of Transportation (Non-Medical): No  Physical Activity: Inactive (08/29/2018)   Exercise Vital Sign    Days of Exercise per Week: 0 days    Minutes of Exercise per Session: 0 min  Stress: No Stress Concern Present (08/29/2018)   [place]    Feeling of Stress : Not at all  Social Connections: Not on file  Intimate Partner Violence: Not At Risk (08/24/2022)   Humiliation, Afraid, Rape, and Kick questionnaire    Fear of Current or Ex-Partner:  No    Emotionally Abused: No    Physically Abused: No    Sexually Abused: No     Allergies  Allergen Reactions   Brilinta [Ticagrelor] Shortness Of Breath   Codeine Nausea And Vomiting   Darvon [Propoxyphene] Nausea Only   Lipitor [Atorvastatin] Other (See Comments)    MYALGIAS   Rosuvastatin Other (See Comments)    MYALGIAS    Simvastatin Other (See Comments)    MYALGIAS    Tramadol Hcl Nausea Only    nausea (08/2019)     Facility-Administered Medications Prior to Visit  Medication Dose Route Frequency Provider Last Rate Last Admin   ondansetron (ZOFRAN) injection 4 mg  4 mg Intravenous Once Jeanell Sparrow, DO       Outpatient Medications Prior to Visit  Medication Sig Dispense Refill   albuterol (PROVENTIL) (2.5 MG/3ML) 0.083% nebulizer solution Take 3 mLs (2.5 mg total)  by nebulization every 2 (two) hours as needed for wheezing or shortness of breath. 75 mL 12   albuterol (VENTOLIN HFA) 108 (90 Base) MCG/ACT inhaler Inhale 1 puff into the lungs every 6 (six) hours as needed for shortness of breath.     apixaban (ELIQUIS) 2.5 MG TABS tablet Take 1 tablet (2.5 mg total) by mouth 2 (two) times daily. 60 tablet 11   Budeson-Glycopyrrol-Formoterol (BREZTRI AEROSPHERE) 160-9-4.8 MCG/ACT AERO Inhale 2 puffs into the lungs 2 (two) times daily as needed (Shortness of breath/ COPD).     denosumab (PROLIA) 60 MG/ML SOSY injection Inject 60 mg as directed every 6 (six) months.     diphenhydramine-acetaminophen (TYLENOL PM) 25-500 MG TABS tablet Take 1 tablet by mouth at bedtime.     doxycycline (VIBRA-TABS) 100 MG tablet Take 1 tablet (100 mg total) by mouth every 12 (twelve) hours for 14 days. 28 tablet 0   Evolocumab (REPATHA SURECLICK) XX123456 MG/ML SOAJ INJECT 1 PEN INTO THE SKIN EVERY 14 DAYS (Patient taking differently: Inject 140 mg into the skin every 14 (fourteen) days.) 2 mL 11   ezetimibe (ZETIA) 10 MG tablet Take 1 tablet by mouth once daily (Patient taking differently: Take 10 mg by mouth daily.) 90 tablet 2   ipratropium-albuterol (DUONEB) 0.5-2.5 (3) MG/3ML SOLN Take 3 mLs by nebulization 3 (three) times daily. 360 mL 0   lisinopril-hydrochlorothiazide (ZESTORETIC) 20-25 MG tablet Take 0.5 tablets by mouth in the morning. (Patient taking differently: Take 0.5 tablets by mouth daily.)     Magnesium Cl-Calcium Carbonate (SLOW-MAG PO) Take 1 tablet by mouth 2 (two) times daily.     metoprolol succinate (TOPROL-XL) 25 MG 24 hr tablet Take 1 tablet (25 mg total) by mouth daily. Pt needs to make appt to see provider for further refills - 2nd attempt 90 tablet 3   metoprolol tartrate (LOPRESSOR) 25 MG tablet Take one half - one tablet (12.5 mg - 25 mg) as needed for Heart Rate 100 or above. (Patient taking differently: Take 12.5 mg by mouth daily as needed (For heart rate 100  or above).) 30 tablet 3   nitroGLYCERIN (NITROSTAT) 0.4 MG SL tablet Place 0.4 mg under the tongue every 5 (five) minutes as needed for chest pain.     predniSONE (DELTASONE) 10 MG tablet Tak 4 tabs daily for 5 days, then take 3 tabs daily for 5 days, then take 2 tabs daily for 5 days, then take 1 tab daily for 5 days, then STOP. 50 tablet 0   tiZANidine (ZANAFLEX) 4 MG capsule Take 4 mg by mouth  daily as needed for muscle spasms.      ROS    Objective:  Physical Exam   There were no vitals filed for this visit.  ***  CBC    Component Value Date/Time   WBC 17.3 (H) 09/10/2022 1803   RBC 3.91 09/10/2022 1803   HGB 13.2 09/10/2022 1803   HGB 10.2 (L) 08/14/2021 0000   HCT 39.1 09/10/2022 1803   HCT 31.9 (L) 08/14/2021 0000   PLT 218 09/10/2022 1803   PLT 280 08/14/2021 0000   MCV 100.0 09/10/2022 1803   MCV 87 08/14/2021 0000   MCH 33.8 09/10/2022 1803   MCHC 33.8 09/10/2022 1803   RDW 13.5 09/10/2022 1803   RDW 15.8 (H) 08/14/2021 0000   LYMPHSABS 4.2 (H) 09/10/2022 1803   MONOABS 1.3 (H) 09/10/2022 1803   EOSABS 0.0 09/10/2022 1803   BASOSABS 0.0 09/10/2022 1803     Chest imaging: September 2023 chest x-ray images independently reviewed showing flattening of the diaphragms and hyperinflation bilaterally, changes of scoliosis noted.  Cardiac silhouette is normal, question ossification of airways noted  PFT:  Labs: September 2023 hemoglobin 11.3 g/dL  Path:  Echo: July 2023 TTE LVEF 65 to 70%, moderate asymmetric focal basal septal left ventricular hypertrophy, grade 2 diastolic dysfunction, RV size is normal, moderately elevated PA systolic pressure estimate at 47 mmHg, left atrium is dilated  Heart Catheterization:       Assessment & Plan:   No diagnosis found.  Discussion: ***  Immunizations: Immunization History  Administered Date(s) Administered   Influenza,inj,Quad PF,6+ Mos 12/11/2013   PFIZER(Purple Top)SARS-COV-2 Vaccination 12/26/2019,  01/13/2020    No current facility-administered medications for this visit.  Current Outpatient Medications:    albuterol (PROVENTIL) (2.5 MG/3ML) 0.083% nebulizer solution, Take 3 mLs (2.5 mg total) by nebulization every 2 (two) hours as needed for wheezing or shortness of breath., Disp: 75 mL, Rfl: 12   albuterol (VENTOLIN HFA) 108 (90 Base) MCG/ACT inhaler, Inhale 1 puff into the lungs every 6 (six) hours as needed for shortness of breath., Disp: , Rfl:    apixaban (ELIQUIS) 2.5 MG TABS tablet, Take 1 tablet (2.5 mg total) by mouth 2 (two) times daily., Disp: 60 tablet, Rfl: 11   Budeson-Glycopyrrol-Formoterol (BREZTRI AEROSPHERE) 160-9-4.8 MCG/ACT AERO, Inhale 2 puffs into the lungs 2 (two) times daily as needed (Shortness of breath/ COPD)., Disp: , Rfl:    denosumab (PROLIA) 60 MG/ML SOSY injection, Inject 60 mg as directed every 6 (six) months., Disp: , Rfl:    diphenhydramine-acetaminophen (TYLENOL PM) 25-500 MG TABS tablet, Take 1 tablet by mouth at bedtime., Disp: , Rfl:    doxycycline (VIBRA-TABS) 100 MG tablet, Take 1 tablet (100 mg total) by mouth every 12 (twelve) hours for 14 days., Disp: 28 tablet, Rfl: 0   Evolocumab (REPATHA SURECLICK) XX123456 MG/ML SOAJ, INJECT 1 PEN INTO THE SKIN EVERY 14 DAYS (Patient taking differently: Inject 140 mg into the skin every 14 (fourteen) days.), Disp: 2 mL, Rfl: 11   ezetimibe (ZETIA) 10 MG tablet, Take 1 tablet by mouth once daily (Patient taking differently: Take 10 mg by mouth daily.), Disp: 90 tablet, Rfl: 2   ipratropium-albuterol (DUONEB) 0.5-2.5 (3) MG/3ML SOLN, Take 3 mLs by nebulization 3 (three) times daily., Disp: 360 mL, Rfl: 0   lisinopril-hydrochlorothiazide (ZESTORETIC) 20-25 MG tablet, Take 0.5 tablets by mouth in the morning. (Patient taking differently: Take 0.5 tablets by mouth daily.), Disp: , Rfl:    Magnesium Cl-Calcium Carbonate (SLOW-MAG PO), Take  1 tablet by mouth 2 (two) times daily., Disp: , Rfl:    metoprolol succinate  (TOPROL-XL) 25 MG 24 hr tablet, Take 1 tablet (25 mg total) by mouth daily. Pt needs to make appt to see provider for further refills - 2nd attempt, Disp: 90 tablet, Rfl: 3   metoprolol tartrate (LOPRESSOR) 25 MG tablet, Take one half - one tablet (12.5 mg - 25 mg) as needed for Heart Rate 100 or above. (Patient taking differently: Take 12.5 mg by mouth daily as needed (For heart rate 100 or above).), Disp: 30 tablet, Rfl: 3   nitroGLYCERIN (NITROSTAT) 0.4 MG SL tablet, Place 0.4 mg under the tongue every 5 (five) minutes as needed for chest pain., Disp: , Rfl:    predniSONE (DELTASONE) 10 MG tablet, Tak 4 tabs daily for 5 days, then take 3 tabs daily for 5 days, then take 2 tabs daily for 5 days, then take 1 tab daily for 5 days, then STOP., Disp: 50 tablet, Rfl: 0   tiZANidine (ZANAFLEX) 4 MG capsule, Take 4 mg by mouth daily as needed for muscle spasms., Disp: , Rfl:   Facility-Administered Medications Ordered in Other Visits:    ondansetron (ZOFRAN) injection 4 mg, 4 mg, Intravenous, Once, Jeanell Sparrow, DO

## 2022-09-10 NOTE — ED Provider Notes (Addendum)
[facility] EMERGENCY DEPT Provider Note   CSN: [id number] Arrival date & time: 09/10/22  1735     History  Chief Complaint  Patient presents with   Abdominal Pain    Sydney Gross is a 88 y.o. female.  Patient as above with significant medical history as below, including COPD, HLD, HTN, diastolic heart failure, pulmonary hypertension, mitral regurg, PAF on eliquis, PAD who presents to the ED with complaint of multiple complaints.  Patient was discharged in the hospital on 9/23 following COPD exacerbation.  She was discharged on 2 weeks of doxycycline, tapering course of prednisone, o/p pulm f/u. She began feeling unwell 9/30.  Having intermittent abdominal discomfort epigastric region.  Intermittent nausea and vomiting.  Intermittent diarrhea described as mushy stool mixed with liquid.  No BRBPR or melena.  Intermittent dizziness described as room spinning sensation.  Productive cough with white sputum.  Intermittent chest tightness.  No dyspnea.  No change to urination.  No rashes, no fevers or chills.  No recent diet or medication changes.  Denies sick contacts.  Denies history of vertigo in the past.  Feels the dizziness is very sudden onset, worsened with head turning or attempting to ambulate.  Feels as though she is spinning.  No numbness or tingling, no neck pain.  No hearing or vision loss.  Patient reports that she took a promethazine just prior to arrival, her abdominal pain and nausea resolved following that.   Follows w/ Dr Daneen Schick HTN, PAF on eliquis, CAD, DES to LAD 2019, pulm HTN, HLD echo 7/23 EF 65-70%, G2DD  Past Medical History:  Diagnosis Date   Allergic rhinitis    Arthritis    "maybe a little in my knees and back" (07/17/2018)   Chronic lower back pain    Colitis 12/2013   "never had this before"   COPD (chronic obstructive pulmonary disease) ([place])    "questionable/Dr. Tamala Julian" (07/17/2018)   Coronary artery disease    GI bleed    H/O  calcium pyrophosphate deposition disease (CPPD)    Hiatal hernia    Hyperlipidemia    Hypertension    Mild mitral regurgitation    On home oxygen therapy    "I've used it 2 times in 2 yrs" (07/17/2018)   PAD (peripheral artery disease) ([facility])    PAF (paroxysmal atrial fibrillation) ([place])    Pulmonary hypertension ([place])    Shingles     Past Surgical History:  Procedure Laterality Date   ABDOMINAL AORTOGRAM W/LOWER EXTREMITY N/A 07/10/2021   Procedure: ABDOMINAL AORTOGRAM W/LOWER EXTREMITY;  Surgeon: Elam Dutch, MD;  Location: [facility];  Service: Cardiovascular;  Laterality: N/A;   ABDOMINAL HYSTERECTOMY  1960's   APPENDECTOMY     BACK SURGERY     BIOPSY  04/20/2021   Procedure: BIOPSY;  Surgeon: Carol Ada, MD;  Location: [facility];  Service: Endoscopy;;   BLEPHAROPLASTY Bilateral 05/2017   CARDIAC CATHETERIZATION     CATARACT EXTRACTION, BILATERAL Bilateral    CESAREAN SECTION  1957; 1960   CORONARY STENT INTERVENTION N/A 07/17/2018   Procedure: CORONARY STENT INTERVENTION;  Surgeon: Belva Crome, MD;  Location: [facility];  Service: Cardiovascular;  Laterality: N/A;   DACROCYSTORHINOSTOMY W/ JONES TUBE Bilateral 05/2017   "put tear duct in my eyes"   ESOPHAGOGASTRODUODENOSCOPY (EGD) WITH PROPOFOL N/A 04/20/2021   Procedure: ESOPHAGOGASTRODUODENOSCOPY (EGD) WITH PROPOFOL;  Surgeon: Carol Ada, MD;  Location: [facility];  Service: Endoscopy;  Laterality: N/A;   EYE  SURGERY     LAPAROSCOPIC CHOLECYSTECTOMY  ~ 2009   LUMBAR [place] SURGERY  ?2004   PERIPHERAL VASCULAR INTERVENTION Left 07/10/2021   Procedure: PERIPHERAL VASCULAR INTERVENTION;  Surgeon: Elam Dutch, MD;  Location: [facility];  Service: Cardiovascular;  Laterality: Left;  common, external iliac arteries   RIGHT/LEFT HEART CATH AND CORONARY ANGIOGRAPHY N/A 07/17/2018   Procedure: RIGHT/LEFT HEART CATH AND CORONARY ANGIOGRAPHY;  Surgeon: Belva Crome, MD;  Location: [facility];  Service: Cardiovascular;  Laterality: N/A;   TONSILLECTOMY AND ADENOIDECTOMY  ?1940's     The history is provided by the patient, the spouse and a relative. No language interpreter was used.  Abdominal Pain Associated symptoms: chest pain, cough, diarrhea, fatigue, nausea and vomiting   Associated symptoms: no dysuria, no fever and no shortness of breath        Home Medications Prior to Admission medications   Medication Sig Start Date End Date Taking? Authorizing Provider  albuterol (PROVENTIL) (2.5 MG/3ML) 0.083% nebulizer solution Take 3 mLs (2.5 mg total) by nebulization every 2 (two) hours as needed for wheezing or shortness of breath. 08/17/22   Geradine Girt, DO  albuterol (VENTOLIN HFA) 108 (90 Base) MCG/ACT inhaler Inhale 1 puff into the lungs every 6 (six) hours as needed for shortness of breath. 08/11/22   [provider]  apixaban (ELIQUIS) 2.5 MG TABS tablet Take 1 tablet (2.5 mg total) by mouth 2 (two) times daily. 06/18/22   Margie Billet, PA-C  Budeson-Glycopyrrol-Formoterol (BREZTRI AEROSPHERE) 160-9-4.8 MCG/ACT AERO Inhale 2 puffs into the lungs 2 (two) times daily as needed (Shortness of breath/ COPD). 11/15/19   [provider]  denosumab (PROLIA) 60 MG/ML SOSY injection Inject 60 mg as directed every 6 (six) months.    [provider]  diphenhydramine-acetaminophen (TYLENOL PM) 25-500 MG TABS tablet Take 1 tablet by mouth at bedtime.    [provider]  doxycycline (VIBRA-TABS) 100 MG tablet Take 1 tablet (100 mg total) by mouth every 12 (twelve) hours for 14 days. 08/28/22 09/11/22  Dahal, Marlowe Aschoff, MD  Evolocumab (REPATHA SURECLICK) 937 MG/ML SOAJ INJECT 1 PEN INTO THE SKIN EVERY 14 DAYS Patient taking differently: Inject 140 mg into the skin every 14 (fourteen) days. 07/19/22   Belva Crome, MD  ezetimibe (ZETIA) 10 MG tablet Take 1 tablet by mouth once daily Patient taking differently: Take 10 mg by mouth daily. 03/24/22    Belva Crome, MD  ipratropium-albuterol (DUONEB) 0.5-2.5 (3) MG/3ML SOLN Take 3 mLs by nebulization 3 (three) times daily. 08/17/22   Geradine Girt, DO  lisinopril-hydrochlorothiazide (ZESTORETIC) 20-25 MG tablet Take 0.5 tablets by mouth in the morning. Patient taking differently: Take 0.5 tablets by mouth daily. 06/18/22   Margie Billet, PA-C  Magnesium Cl-Calcium Carbonate (SLOW-MAG PO) Take 1 tablet by mouth 2 (two) times daily.    [provider]  metoprolol succinate (TOPROL-XL) 25 MG 24 hr tablet Take 1 tablet (25 mg total) by mouth daily. Pt needs to make appt to see provider for further refills - 2nd attempt 06/18/22   Margie Billet, PA-C  metoprolol tartrate (LOPRESSOR) 25 MG tablet Take one half - one tablet (12.5 mg - 25 mg) as needed for Heart Rate 100 or above. Patient taking differently: Take 12.5 mg by mouth daily as needed (For heart rate 100 or above). 08/05/22   Swinyer, Lanice Schwab, NP  nitroGLYCERIN (NITROSTAT) 0.4 MG SL tablet Place 0.4 mg under  the tongue every 5 (five) minutes as needed for chest pain.    [provider]  predniSONE (DELTASONE) 10 MG tablet Tak 4 tabs daily for 5 days, then take 3 tabs daily for 5 days, then take 2 tabs daily for 5 days, then take 1 tab daily for 5 days, then STOP. 08/28/22   Dahal, Marlowe Aschoff, MD  tiZANidine (ZANAFLEX) 4 MG capsule Take 4 mg by mouth daily as needed for muscle spasms.    [provider]      Allergies    Brilinta [ticagrelor], Codeine, Darvon [propoxyphene], Lipitor [atorvastatin], Rosuvastatin, Simvastatin, and Tramadol hcl    Review of Systems   Review of Systems  Constitutional:  Positive for fatigue. Negative for activity change and fever.  HENT:  Positive for congestion, rhinorrhea and sinus pressure. Negative for facial swelling and trouble swallowing.   Eyes:  Negative for discharge and redness.  Respiratory:  Positive for cough. Negative for shortness of breath.   Cardiovascular:   Positive for chest pain. Negative for palpitations and leg swelling.  Gastrointestinal:  Positive for abdominal pain, diarrhea, nausea and vomiting.  Genitourinary:  Negative for dysuria, flank pain and urgency.  Musculoskeletal:  Negative for back pain and gait problem.  Skin:  Negative for pallor and rash.  Neurological:  Positive for dizziness. Negative for syncope, weakness, numbness and headaches.    Physical Exam Updated Vital Signs BP (!) 147/75   Pulse 81   Temp 98.1 F (36.7 C) (Oral)   Resp 20   Ht _0  (1.499 m)   Wt 49.4 kg   SpO2 97%   BMI 22.00 kg/m  Physical Exam Vitals and nursing note reviewed.  Constitutional:      General: She is awake. She is not in acute distress.    Appearance: Normal appearance. She is well-developed. She is not ill-appearing or diaphoretic.  HENT:     Head: Normocephalic and atraumatic. No raccoon eyes, Battle's sign, right periorbital erythema or left periorbital erythema.     Jaw: There is normal jaw occlusion. No trismus.     Right Ear: External ear normal.     Left Ear: External ear normal.     Ears:     Comments: Cerumen noted bilateral, No mastoid tenderness     Nose: Nose normal.     Mouth/Throat:     Mouth: Mucous membranes are moist.     Pharynx: Oropharynx is clear.  Eyes:     General: No scleral icterus.       Right eye: No discharge.        Left eye: No discharge.     Extraocular Movements: Extraocular movements intact.     Pupils: Pupils are equal, round, and reactive to light.  Neck:     Vascular: No carotid bruit.     Trachea: Trachea normal. No tracheal deviation.     Meningeal: Brudzinski's sign and Kernig's sign absent.  Cardiovascular:     Rate and Rhythm: Normal rate and regular rhythm.     Pulses: Normal pulses.     Heart sounds: Normal heart sounds.     No S3 or S4 sounds.  Pulmonary:     Effort: Pulmonary effort is normal. No tachypnea, accessory muscle usage or respiratory distress.     Breath  sounds: Normal breath sounds. No wheezing.  Abdominal:     General: Abdomen is flat.     Palpations: Abdomen is soft.     Tenderness: There is no abdominal tenderness.  There is no guarding or rebound.  Musculoskeletal:        General: Normal range of motion.     Cervical back: Full passive range of motion without pain and normal range of motion.     Right lower leg: No edema.     Left lower leg: No edema.  Skin:    General: Skin is warm and dry.     Capillary Refill: Capillary refill takes less than 2 seconds.  Neurological:     Mental Status: She is alert and oriented to person, place, and time.     GCS: GCS eye subscore is 4. GCS verbal subscore is 5. GCS motor subscore is 6.     Cranial Nerves: Cranial nerves 2-12 are intact. No dysarthria or facial asymmetry.     Sensory: Sensation is intact.     Motor: Motor function is intact. No weakness.     Coordination: Coordination is intact. Finger-Nose-Finger Test normal.     Comments: Strength 5/5 symmetric bilateral upper extremities, bilateral lower extremities  HINTS exam with positive subtle right-sided nystagmus, no skew deviation, corrective saccade noted  Psychiatric:        Mood and Affect: Mood normal.        Behavior: Behavior normal. Behavior is cooperative.     ED Results / Procedures / Treatments   Labs (all labs ordered are listed, but only abnormal results are displayed) Labs Reviewed  CBC WITH DIFFERENTIAL/PLATELET - Abnormal; Notable for the following components:      Result Value   WBC 17.3 (*)    Neutro Abs 11.7 (*)    Lymphs Abs 4.2 (*)    Monocytes Absolute 1.3 (*)    Abs Immature Granulocytes 0.08 (*)    All other components within normal limits  COMPREHENSIVE METABOLIC PANEL - Abnormal; Notable for the following components:   Sodium 131 (*)    Chloride 93 (*)    BUN 41 (*)    Creatinine, Ser 1.06 (*)    GFR, Estimated 50 (*)    All other components within normal limits  LIPASE, BLOOD - Abnormal;  Notable for the following components:   Lipase 58 (*)    All other components within normal limits  TROPONIN I (HIGH SENSITIVITY) - Abnormal; Notable for the following components:   Troponin I (High Sensitivity) 155 (*)    All other components within normal limits  TROPONIN I (HIGH SENSITIVITY) - Abnormal; Notable for the following components:   Troponin I (High Sensitivity) 135 (*)    All other components within normal limits  RESP PANEL BY RT-PCR (FLU A&B, COVID) ARPGX2  MAGNESIUM  URINALYSIS, ROUTINE W REFLEX MICROSCOPIC    EKG EKG Interpretation  Date/Time:  Friday September 10 2022 18:28:15 EDT Ventricular Rate:  83 PR Interval:  114 QRS Duration: 81 QT Interval:  355 QTC Calculation: 418 R Axis:   52 Text Interpretation: Sinus rhythm Supraventricular bigeminy Borderline short PR interval no stemi Confirmed by Wynona Dove ([id number]) on 09/10/2022 10:16:19 PM  Radiology CT ANGIO HEAD NECK W WO CM  Result Date: 09/10/2022 CLINICAL DATA:  Initial evaluation for acute dizziness. EXAM: CT ANGIOGRAPHY HEAD AND NECK TECHNIQUE: Multidetector CT imaging of the head and neck was performed using the standard protocol during bolus administration of intravenous contrast. Multiplanar CT image reconstructions and MIPs were obtained to evaluate the vascular anatomy. Carotid stenosis measurements (when applicable) are obtained utilizing NASCET criteria, using the distal internal carotid diameter as the denominator. RADIATION DOSE REDUCTION: This  exam was performed according to the departmental dose-optimization program which includes automated exposure control, adjustment of the mA and/or kV according to patient size and/or use of iterative reconstruction technique. CONTRAST:  76m OMNIPAQUE IOHEXOL 350 MG/ML SOLN COMPARISON:  Prior study from 06/23/2012. FINDINGS: CT HEAD FINDINGS Brain: Age-related cerebral atrophy with chronic small vessel ischemic disease. No acute intracranial hemorrhage. No acute  large vessel territory infarct. 1.5 cm calcified meningioma overlies anterior left frontal convexity without edema or mass effect. No midline shift or hydrocephalus. No extra-axial fluid collection. Vascular: No hyperdense vessel. Scattered vascular calcifications noted within the carotid siphons. Skull: Scalp soft tissues and calvarium within normal limits. Sinuses/Orbits: Globes orbital soft tissues demonstrate no acute finding. Visualized paranasal sinuses and mastoid air cells are clear. Other: None. Review of the MIP images confirms the above findings CTA NECK FINDINGS Aortic arch: Visualized aortic arch normal caliber with standard branch pattern. Moderate atheromatous change about the arch itself. No high-grade stenosis about the origin the great vessels. Right carotid system: Right common and internal carotid arteries are tortuous without dissection or occlusion. No hemodynamically significant stenosis about the right carotid artery system within the neck. Left carotid system: She left common and internal carotid arteries are tortuous but patent without dissection or occlusion. No hemodynamically significant stenosis about the left carotid artery system within the neck. Vertebral arteries: Both vertebral arteries arise from subclavian arteries. No proximal subclavian artery stenosis. Vertebral arteries are tortuous but widely patent without dissection or stenosis. Skeleton: No discrete or worrisome osseous lesions. Moderate cervical spondylosis with grade 1 anterolisthesis of C2 on C3. Other neck: No other acute soft tissue abnormality within the neck. Upper chest: Visualized upper chest demonstrates no acute finding. Review of the MIP images confirms the above findings CTA HEAD FINDINGS Anterior circulation: Petrous segments patent bilaterally. Mild for age atheromatous change within the carotid siphons without stenosis. Left A1 patent. Right A1 hypoplastic but grossly patent as well, accounting for the  diminutive right ICA is compared to the left. Normal anterior communicating artery complex. Tortuosity and atheromatous irregularity throughout the ACAs without proximal high-grade stenosis. No high-grade M1 stenosis or occlusion. No proximal MCA branch occlusion. Diffuse MCA branches well perfused bilaterally, although demonstrate diffuse atheromatous irregularity. Posterior circulation: Intradural V4 segments are tortuous but patent without stenosis. Both PICA patent. Atheromatous irregularity within the basilar artery with associated mild to moderate stenoses. Superior cerebral arteries patent bilaterally. Both PCAs primarily supplied via the basilar. Atheromatous irregularity throughout both PCAs with associated moderate to severe left P2 stenoses. Both PCAs remain patent to their distal aspects. Venous sinuses: Patent allowing for timing the contrast bolus. Anatomic variants: None significant.  No aneurysm. Review of the MIP images confirms the above findings IMPRESSION: CT HEAD: 1. No acute intracranial abnormality. 2. Age-related cerebral atrophy with chronic small vessel ischemic disease. 3. 1.5 cm calcified meningioma overlying the anterior left frontal convexity without edema or mass effect. CTA HEAD AND NECK: 1. Negative CTA for large vessel occlusion or other emergent finding. 2. Moderate intracranial atherosclerotic disease, with most notable findings including moderate to severe left P2 stenoses. No other proximal high-grade or correctable stenosis. 3. Diffuse tortuosity about the major arterial vasculature of the head and neck, suggesting chronic underlying hypertension. Aortic Atherosclerosis (ICD10-I70.0). Electronically Signed   By: BJeannine BogaM.D.   On: 09/10/2022 20:23   CT ABDOMEN PELVIS W CONTRAST  Result Date: 09/10/2022 CLINICAL DATA:  Abdominal pain, nausea, diarrhea, dizziness EXAM: CT ABDOMEN AND PELVIS WITH CONTRAST  TECHNIQUE: Multidetector CT imaging of the abdomen and  pelvis was performed using the standard protocol following bolus administration of intravenous contrast. RADIATION DOSE REDUCTION: This exam was performed according to the departmental dose-optimization program which includes automated exposure control, adjustment of the mA and/or kV according to patient size and/or use of iterative reconstruction technique. CONTRAST:  44m OMNIPAQUE IOHEXOL 350 MG/ML SOLN COMPARISON:  06/17/2022 FINDINGS: Lower chest: Lung bases are clear. Hepatobiliary: Liver is within normal limits, noting focal fat/altered perfusion along the falciform ligament. Status post cholecystectomy. No intrahepatic or extrahepatic ductal dilatation. Pancreas: Within normal limits. Spleen: Within normal limits. Adrenals/Urinary Tract: Adrenal glands are within normal limits. 4.6 cm right lower pole renal cyst (series 3/image 24), measuring simple fluid density, benign (Bosniak I). No follow-up is recommended. 7 mm fat density lesion in the posterior left lower kidney (series 3/image 16), reflecting a benign renal angiomyolipoma. No follow-up is recommended. Mild left renal atrophy. No hydronephrosis. Bladder is within normal limits. Stomach/Bowel: Stomach is within normal limits. No evidence of bowel obstruction. Appendix is not discretely visualized, likely surgically absent. Sigmoid diverticulosis, without evidence of diverticulitis. Vascular/Lymphatic: No evidence of abdominal aortic aneurysm. 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 Left common iliac stent. Atherosclerotic calcifications of the abdominal aorta and branch vessels. No suspicious abdominopelvic lymphadenopathy. Reproductive: Status post hysterectomy. No adnexal masses. Other: No abdominopelvic ascites. Musculoskeletal: Degenerative changes of the visualized thoracolumbar spine. Upper lumbar dextroscoliosis. IMPRESSION: Sigmoid diverticulosis, without evidence of diverticulitis. 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 Left common iliac stent. No CT findings to account for the patient's abdominal pain. Electronically Signed   By: SJulian HyM.D.   On: 09/10/2022 20:01   DG Chest Portable 1 View  Result Date: 09/10/2022 CLINICAL DATA:  Addendum abdominal pain. EXAM: PORTABLE CHEST 1 VIEW COMPARISON:  Chest x-ray 08/24/2022 FINDINGS: The heart size and mediastinal contours are within normal limits. Both lungs are clear. There is levoconvex curvature of the thoracic spine. IMPRESSION: No active disease. Electronically Signed   By: ARonney AstersM.D.   On: 09/10/2022 18:49    Procedures Procedures    Medications Ordered in ED Medications  ondansetron (ZOFRAN) injection 4 mg (4 mg Intravenous Patient Refused/Not Given 09/10/22 1905)  sodium chloride 0.9 % bolus 1,000 mL (0 mLs Intravenous Stopped 09/10/22 2316)  meclizine (ANTIVERT) tablet 25 mg (25 mg Oral Given 09/10/22 1912)  iohexol (OMNIPAQUE) 350 MG/ML injection 100 mL (75 mLs Intravenous Contrast Given 09/10/22 1946)  aspirin chewable tablet 324 mg (324 mg Oral Given 09/10/22 2223)  metoCLOPramide (REGLAN) injection 5 mg (5 mg Intravenous Given 09/10/22 2317)  diphenhydrAMINE (BENADRYL) injection 12.5 mg (12.5 mg Intravenous Given 09/10/22 2316)    ED Course/ Medical Decision Making/ A&P                           Medical Decision Making Amount and/or Complexity of Data Reviewed Labs: ordered. Radiology: ordered.  Risk OTC drugs. Prescription drug management. Decision regarding hospitalization.   This patient presents to the ED with chief complaint(s) of complaints including dizziness, chest pain, cough, abdominal pain, nausea and vomiting, diarrhea with pertinent past medical history of as above which further complicates the presenting complaint. The complaint involves an extensive differential diagnosis and also carries with it a high risk of complications and morbidity.    Differential includes all  life-threatening causes for chest pain. This includes but is not exclusive to  acute coronary syndrome, aortic dissection, pulmonary embolism, cardiac tamponade, community-acquired pneumonia, pericarditis, musculoskeletal chest wall pain, etc.  Differential diagnosis includes but is not exclusive to acute cholecystitis, intrathoracic causes for epigastric abdominal pain, gastritis, duodenitis, pancreatitis, small bowel or large bowel obstruction, abdominal aortic aneurysm, hernia, gastritis, etc.  . Serious etiologies were considered.   The initial plan is to screening labs, will get head CT, CT abdomen, chest x-ray, give IV fluids   Additional history obtained: Additional history obtained from family and spouse Records reviewed previous admission documents and home medications, prior labs and imaging  Independent labs interpretation:  The following labs were independently interpreted:  CMP with mild hyponatremia 131 BUN is elevated 41.  Continue IV fluids. WBC 17.3, patient is recently on steroids after COPD exacerbation. Troponin initially 153, delta 135.  ECG without acute ischemia. Lipase at 58 RVP negative  Independent visualization of imaging: - I independently visualized the following imaging with scope of interpretation limited to determining acute life threatening conditions related to emergency care: CTA head/neck/CTAP/cXR, which revealed CTA with athero  sclerosis, no CVA or LVO.  CTA with unchanged iliac artery aneurysm, diverticulosis without diverticulitis.  Chest x-ray unremarkable  Cardiac monitoring was reviewed and interpreted by myself which shows premature beats  Treatment and Reassessment: Antivert, IV fluids, aspirin, reglan/benadryl .> improved   Consultation: - Consulted or discussed management/test interpretation w/ external professional:  On-call cardiology Dr. Myles Gip, troponin elevated 150, delta downtrending 130s.  She has mild chest discomfort.  EKG stable.  Heart score is a 5.  Discussed with on-call cardiology, recommends overnight observation on telemetry.  Hold heparin.  Consideration for admission or further workup: Admission was considered   88 year old female with history as above to the ED with myriad of complaints.  Found to have elevated troponin, chest pain with a troponin elevation.  Elevated heart score at 5.  Discussed with cardiology, recommend overnight observation on telemetry, can likely discharge home if symptoms resolved in the morning per cardiologist.   D/w hospitalist who accepts pt for admission.   Social Determinants of health: Social History   Tobacco Use   Smoking status: Never    Passive exposure: Past   Smokeless tobacco: Never  Vaping Use   Vaping Use: Never used  Substance Use Topics   Alcohol use: Yes    Alcohol/week: 4.0 standard drinks of alcohol    Types: 4 Glasses of wine per week    Comment: glass of wine every other night   Drug use: Never            Final Clinical Impression(s) / ED Diagnoses Final diagnoses:  Chest pain, unspecified type  Elevated troponin  Dizzy    Rx / DC Orders ED Discharge Orders     None         Jeanell Sparrow, DO 09/11/22 0004    Jeanell Sparrow, DO 09/11/22 0006

## 2022-09-10 NOTE — ED Triage Notes (Signed)
Pt arrives to ED with c/o abdominal pain x1 week. Symptoms include nausea, diarrhea, dizziness.

## 2022-09-11 ENCOUNTER — Encounter (HOSPITAL_COMMUNITY): Payer: Self-pay | Admitting: Family Medicine

## 2022-09-11 ENCOUNTER — Observation Stay (HOSPITAL_COMMUNITY): Payer: Medicare Other

## 2022-09-11 DIAGNOSIS — D62 Acute posthemorrhagic anemia: Secondary | ICD-10-CM | POA: Diagnosis present

## 2022-09-11 DIAGNOSIS — R42 Dizziness and giddiness: Secondary | ICD-10-CM

## 2022-09-11 DIAGNOSIS — Z23 Encounter for immunization: Secondary | ICD-10-CM | POA: Diagnosis present

## 2022-09-11 DIAGNOSIS — R197 Diarrhea, unspecified: Secondary | ICD-10-CM | POA: Diagnosis not present

## 2022-09-11 DIAGNOSIS — I272 Pulmonary hypertension, unspecified: Secondary | ICD-10-CM | POA: Diagnosis present

## 2022-09-11 DIAGNOSIS — N1831 Chronic kidney disease, stage 3a: Secondary | ICD-10-CM

## 2022-09-11 DIAGNOSIS — R109 Unspecified abdominal pain: Secondary | ICD-10-CM | POA: Diagnosis not present

## 2022-09-11 DIAGNOSIS — I480 Paroxysmal atrial fibrillation: Secondary | ICD-10-CM | POA: Diagnosis not present

## 2022-09-11 DIAGNOSIS — K264 Chronic or unspecified duodenal ulcer with hemorrhage: Secondary | ICD-10-CM | POA: Diagnosis present

## 2022-09-11 DIAGNOSIS — J449 Chronic obstructive pulmonary disease, unspecified: Secondary | ICD-10-CM

## 2022-09-11 DIAGNOSIS — I351 Nonrheumatic aortic (valve) insufficiency: Secondary | ICD-10-CM | POA: Diagnosis not present

## 2022-09-11 DIAGNOSIS — E861 Hypovolemia: Secondary | ICD-10-CM | POA: Diagnosis present

## 2022-09-11 DIAGNOSIS — R778 Other specified abnormalities of plasma proteins: Secondary | ICD-10-CM

## 2022-09-11 DIAGNOSIS — I2510 Atherosclerotic heart disease of native coronary artery without angina pectoris: Secondary | ICD-10-CM | POA: Diagnosis present

## 2022-09-11 DIAGNOSIS — E785 Hyperlipidemia, unspecified: Secondary | ICD-10-CM | POA: Diagnosis present

## 2022-09-11 DIAGNOSIS — E871 Hypo-osmolality and hyponatremia: Secondary | ICD-10-CM | POA: Diagnosis not present

## 2022-09-11 DIAGNOSIS — Z955 Presence of coronary angioplasty implant and graft: Secondary | ICD-10-CM | POA: Diagnosis not present

## 2022-09-11 DIAGNOSIS — Z66 Do not resuscitate: Secondary | ICD-10-CM | POA: Diagnosis present

## 2022-09-11 DIAGNOSIS — Z823 Family history of stroke: Secondary | ICD-10-CM | POA: Diagnosis not present

## 2022-09-11 DIAGNOSIS — I2489 Other forms of acute ischemic heart disease: Secondary | ICD-10-CM | POA: Diagnosis present

## 2022-09-11 DIAGNOSIS — I739 Peripheral vascular disease, unspecified: Secondary | ICD-10-CM | POA: Diagnosis present

## 2022-09-11 DIAGNOSIS — Z7901 Long term (current) use of anticoagulants: Secondary | ICD-10-CM | POA: Diagnosis not present

## 2022-09-11 DIAGNOSIS — K254 Chronic or unspecified gastric ulcer with hemorrhage: Secondary | ICD-10-CM | POA: Diagnosis present

## 2022-09-11 DIAGNOSIS — D72829 Elevated white blood cell count, unspecified: Secondary | ICD-10-CM | POA: Diagnosis present

## 2022-09-11 DIAGNOSIS — R7989 Other specified abnormal findings of blood chemistry: Secondary | ICD-10-CM

## 2022-09-11 DIAGNOSIS — R112 Nausea with vomiting, unspecified: Secondary | ICD-10-CM | POA: Diagnosis not present

## 2022-09-11 DIAGNOSIS — I509 Heart failure, unspecified: Secondary | ICD-10-CM | POA: Diagnosis not present

## 2022-09-11 DIAGNOSIS — I350 Nonrheumatic aortic (valve) stenosis: Secondary | ICD-10-CM

## 2022-09-11 DIAGNOSIS — I5032 Chronic diastolic (congestive) heart failure: Secondary | ICD-10-CM | POA: Diagnosis present

## 2022-09-11 DIAGNOSIS — I110 Hypertensive heart disease with heart failure: Secondary | ICD-10-CM | POA: Diagnosis present

## 2022-09-11 DIAGNOSIS — Z1152 Encounter for screening for COVID-19: Secondary | ICD-10-CM | POA: Diagnosis not present

## 2022-09-11 DIAGNOSIS — I340 Nonrheumatic mitral (valve) insufficiency: Secondary | ICD-10-CM | POA: Diagnosis present

## 2022-09-11 DIAGNOSIS — K921 Melena: Secondary | ICD-10-CM | POA: Diagnosis not present

## 2022-09-11 DIAGNOSIS — K279 Peptic ulcer, site unspecified, unspecified as acute or chronic, without hemorrhage or perforation: Secondary | ICD-10-CM | POA: Diagnosis not present

## 2022-09-11 DIAGNOSIS — Z9049 Acquired absence of other specified parts of digestive tract: Secondary | ICD-10-CM | POA: Diagnosis not present

## 2022-09-11 DIAGNOSIS — Z825 Family history of asthma and other chronic lower respiratory diseases: Secondary | ICD-10-CM | POA: Diagnosis not present

## 2022-09-11 LAB — BASIC METABOLIC PANEL
Anion gap: 8 (ref 5–15)
BUN: 24 mg/dL — ABNORMAL HIGH (ref 8–23)
CO2: 24 mmol/L (ref 22–32)
Calcium: 8 mg/dL — ABNORMAL LOW (ref 8.9–10.3)
Chloride: 102 mmol/L (ref 98–111)
Creatinine, Ser: 0.87 mg/dL (ref 0.44–1.00)
GFR, Estimated: >60 mL/min (ref >60)
Glucose, Bld: 116 mg/dL — ABNORMAL HIGH (ref 70–99)
Potassium: 4.4 mmol/L (ref 3.5–5.1)
Sodium: 134 mmol/L — ABNORMAL LOW (ref 135–145)

## 2022-09-11 LAB — ECHOCARDIOGRAM COMPLETE
AR max vel: 1.57 cm2
AV Area VTI: 1.39 cm2
AV Area mean vel: 1.61 cm2
AV Mean grad: 16 mmHg
AV Peak grad: 32.9 mmHg
Ao pk vel: 2.87 m/s
Calc EF: 65.6 %
Height: 59 in
P 1/2 time: 584 msec
S' Lateral: 1.6 cm
Single Plane A2C EF: 61.4 %
Single Plane A4C EF: 72 %
Weight: 1604.8 oz

## 2022-09-11 LAB — CBC
HCT: 35.7 % — ABNORMAL LOW (ref 36.0–46.0)
Hemoglobin: 11.5 g/dL — ABNORMAL LOW (ref 12.0–15.0)
MCH: 33.3 pg (ref 26.0–34.0)
MCHC: 32.2 g/dL (ref 30.0–36.0)
MCV: 103.5 fL — ABNORMAL HIGH (ref 80.0–100.0)
Platelets: 201 10*3/uL (ref 150–400)
RBC: 3.45 MIL/uL — ABNORMAL LOW (ref 3.87–5.11)
RDW: 13.5 % (ref 11.5–15.5)
WBC: 13.5 10*3/uL — ABNORMAL HIGH (ref 4.0–10.5)
nRBC: 0 % (ref 0.0–0.2)

## 2022-09-11 LAB — TROPONIN I (HIGH SENSITIVITY)
Troponin I (High Sensitivity): 97 ng/L — ABNORMAL HIGH (ref <18)
Troponin I (High Sensitivity): 87 ng/L — ABNORMAL HIGH (ref <18)

## 2022-09-11 MED ORDER — IPRATROPIUM-ALBUTEROL 0.5-2.5 (3) MG/3ML IN SOLN
3.0000 mL | Freq: Three times a day (TID) | RESPIRATORY_TRACT | Status: DC
  Administered 2022-09-11 (×2): 3 mL via RESPIRATORY_TRACT
  Filled 2022-09-11 (×2): qty 3

## 2022-09-11 MED ORDER — SODIUM CHLORIDE 0.9 % IV SOLN: INTRAVENOUS | Status: DC

## 2022-09-11 MED ORDER — METOCLOPRAMIDE HCL 5 MG/ML IJ SOLN
10.0000 mg | Freq: Three times a day (TID) | INTRAMUSCULAR | Status: AC
  Administered 2022-09-11 (×3): 10 mg via INTRAVENOUS
  Filled 2022-09-11 (×3): qty 2

## 2022-09-11 MED ORDER — ONDANSETRON HCL 4 MG/2ML IJ SOLN
4.0000 mg | Freq: Four times a day (QID) | INTRAMUSCULAR | Status: DC | PRN
  Administered 2022-09-11: 4 mg via INTRAVENOUS
  Filled 2022-09-11: qty 2

## 2022-09-11 MED ORDER — APIXABAN 2.5 MG PO TABS
2.5000 mg | ORAL_TABLET | Freq: Two times a day (BID) | ORAL | Status: DC
  Administered 2022-09-11 (×3): 2.5 mg via ORAL
  Filled 2022-09-11 (×3): qty 1

## 2022-09-11 MED ORDER — MELATONIN 3 MG PO TABS
3.0000 mg | ORAL_TABLET | Freq: Every evening | ORAL | Status: DC | PRN
  Administered 2022-09-11 – 2022-09-14 (×3): 3 mg via ORAL
  Filled 2022-09-11 (×3): qty 1

## 2022-09-11 MED ORDER — MAGNESIUM CHLORIDE 64 MG PO TBEC
1.0000 | DELAYED_RELEASE_TABLET | Freq: Two times a day (BID) | ORAL | Status: DC
  Administered 2022-09-11 – 2022-09-15 (×8): 64 mg via ORAL
  Filled 2022-09-11 (×9): qty 1

## 2022-09-11 MED ORDER — FLUTICASONE FUROATE-VILANTEROL 200-25 MCG/ACT IN AEPB
1.0000 | INHALATION_SPRAY | Freq: Every day | RESPIRATORY_TRACT | Status: DC
  Administered 2022-09-11 – 2022-09-15 (×5): 1 via RESPIRATORY_TRACT
  Filled 2022-09-11: qty 28

## 2022-09-11 MED ORDER — LISINOPRIL 20 MG PO TABS
20.0000 mg | ORAL_TABLET | Freq: Every day | ORAL | Status: DC
  Administered 2022-09-11: 20 mg via ORAL
  Filled 2022-09-11: qty 1

## 2022-09-11 MED ORDER — BUDESON-GLYCOPYRROL-FORMOTEROL 160-9-4.8 MCG/ACT IN AERO: 2.0000 | INHALATION_SPRAY | Freq: Two times a day (BID) | RESPIRATORY_TRACT | Status: DC | PRN

## 2022-09-11 MED ORDER — TIZANIDINE HCL 4 MG PO TABS
4.0000 mg | ORAL_TABLET | Freq: Three times a day (TID) | ORAL | Status: DC | PRN
  Administered 2022-09-11 – 2022-09-13 (×2): 4 mg via ORAL
  Filled 2022-09-11 (×3): qty 1

## 2022-09-11 MED ORDER — METOPROLOL SUCCINATE ER 25 MG PO TB24
25.0000 mg | ORAL_TABLET | Freq: Every day | ORAL | Status: DC
  Administered 2022-09-11: 25 mg via ORAL
  Filled 2022-09-11: qty 1

## 2022-09-11 MED ORDER — ONDANSETRON HCL 4 MG/2ML IJ SOLN: 4.0000 mg | Freq: Four times a day (QID) | INTRAMUSCULAR | Status: DC | PRN

## 2022-09-11 MED ORDER — PREDNISONE 20 MG PO TABS
20.0000 mg | ORAL_TABLET | Freq: Every day | ORAL | Status: DC
  Administered 2022-09-11 – 2022-09-14 (×4): 20 mg via ORAL
  Filled 2022-09-11 (×4): qty 1

## 2022-09-11 MED ORDER — ACETAMINOPHEN 325 MG PO TABS: 650.0000 mg | ORAL_TABLET | ORAL | Status: DC | PRN

## 2022-09-11 MED ORDER — INFLUENZA VAC A&B SA ADJ QUAD 0.5 ML IM PRSY
0.5000 mL | PREFILLED_SYRINGE | INTRAMUSCULAR | Status: AC
  Administered 2022-09-15: 0.5 mL via INTRAMUSCULAR
  Filled 2022-09-11: qty 0.5

## 2022-09-11 MED ORDER — MECLIZINE HCL 25 MG PO TABS: 12.5000 mg | ORAL_TABLET | Freq: Three times a day (TID) | ORAL | Status: DC | PRN

## 2022-09-11 MED ORDER — UMECLIDINIUM BROMIDE 62.5 MCG/ACT IN AEPB
1.0000 | INHALATION_SPRAY | Freq: Every day | RESPIRATORY_TRACT | Status: DC
  Administered 2022-09-11 – 2022-09-15 (×5): 1 via RESPIRATORY_TRACT
  Filled 2022-09-11: qty 7

## 2022-09-11 NOTE — Progress Notes (Signed)
Mobility Specialist - Progress Note   09/11/22 1002  Mobility  Activity Ambulated with assistance in hallway  Activity Response Tolerated well  Distance Ambulated (ft) 140 ft  $Mobility charge 1 Mobility  Level of Assistance Standby assist, set-up cues, supervision of patient - no hands on  Assistive Device None  Mobility Referral Yes   Pt was received in bed and agreeable to mobility. No complaints throughout ambulation. Pt was returned to bed with all needs met.   Larey Seat

## 2022-09-11 NOTE — Progress Notes (Signed)
  PROGRESS NOTE  Patient admitted earlier this morning. See H&P.   Patient presented with symptoms of abdominal pain, nausea, vomiting, diarrhea.  Also had some chest aches several days ago.  Denies any chest pain this morning.  She received Reglan and nausea seems to be slightly improved and was able to take some bites of food, but still feeling quite weak and not eating as much as normal.  Troponin trending down Echocardiogram EF 65-70%, no wall motion abnl  CT abdomen pelvis without acute finding  Unclear etiology of nausea, vomiting, diarrhea, abdominal pain.  Possible gastroenteritis.  Continue antiemetics, Reglan.  Resume IV fluids today Hold HCTZ PT eval   Discussed with daughter over the phone today   Status is: Observation The patient will require care spanning > 2 midnights and should be moved to inpatient because: IVF    Dessa Phi, DO [facility] 09/11/2022, 12:51 PM  Available via Epic secure chat 7am-7pm After these hours, please refer to coverage provider listed on [email or web address]

## 2022-09-11 NOTE — Progress Notes (Signed)
Echocardiogram 2D Echocardiogram has been performed.  Sydney Gross 09/11/2022, 9:29 AM

## 2022-09-11 NOTE — Progress Notes (Signed)
Pt arrived on unit via stretcher w/ transport, paged admitting & pt placement RN. VSS, cardiac monitoring in place, ambulatory w/ standby assist.

## 2022-09-11 NOTE — Progress Notes (Addendum)
Transferring facility: [facility] Requesting provider: Dr. Wynona Dove (EDP at [facility]) Reason for transfer: admission for further evaluation and management of elevated troponin.   88year old female with medical history notable for paroxysmal atrial fibrillation chronically anticoagulated on Eliquis, coronary artery disease status post prior stent, COPD, who presented to [facility] ED complaining of chest discomfort.   The patient was recently hospitalized for an acute COPD exacerbation, discharged on 08/28/2022 on additional start corticosteroids.  While she has not experienced any interval shortness of breath, she presented [facility] earlier today with complaint of vague chest pain associated with some nausea/vomiting as well as some dizziness.  Troponin found to be in the 150s, with EKG showing no evidence of acute ischemic changes, including no STEMI.  Dyspnea is reportedly intermittent and nonexertional.  Vital signs in the ED were reported to be stable, including maintaining oxygen saturations in the mid 90s on room air.  Additional labs notable for leukocytosis, while noting that the patient has been on interval systemic corticosteroid therapy.  Chest x-ray showed no evidence of acute cardiopulmonary process.  [facility] EDP discussed patient's case with on-call cardiology, Dr. Myles Gip, who felt that this mild elevation troponin was most likely as a consequence of type II supply/demand mismatch, with presentation less likely to be representative of ACS.  He recommended overnight observation to the hospitalist service, continued trending of troponin, echocardiogram in the morning. He recommended that we NOT start heparin drip and conveyed that cardiology would be available for additional consultation on an as needed basis.   Subsequently, I accepted this patient for transfer for observation to a cardiac tele bed at [facility] for further work-up and management of mildly elevated troponin.        Check  [email or web address] for on-call coverage.   Nursing staff, Please call [place] number on Amion as soon as patient's arrival, so appropriate admitting provider can evaluate the pt.     Babs Bertin, DO Hospitalist

## 2022-09-11 NOTE — H&P (Signed)
History and Physical    Sydney Gross XNA:[id number] DOB: 03-20-1930 DOA: 09/10/2022  PCP: Mayra Neer, MD   Patient coming from: Home   Chief Complaint: Abdominal pain, N/V/D, dizziness   HPI: Sydney Gross is a very pleasant 88 y.o. female with medical history significant for COPD, CAD, PAD, chronic diastolic CHF, PAF on Eliquis, CKD 3A, hypertension, and recent admission for COPD exacerbation who presents to the emergency department with abdominal pain, nausea, vomiting, diarrhea, and dizziness.  Patient reports 1 week of intermittent upper abdominal pain with nausea, nonbloody vomiting, and some loose stools.  She was also experiencing a room spinning sensation.  She had a mild ache in her chest on 09/06/2022 but no chest discomfort since.  She took promethazine prior to arrival in the ED and symptoms seem to resolve with that.  She had had a poor appetite for the past week but is hungry now. She denies fever or chills and denies sick contacts.   [place] ED Course: Upon arrival to the ED, patient is found to be afebrile and saturating well on room air with stable blood pressure.  EKG features sinus rhythm and chest x-ray is negative for acute cardiopulmonary disease.  CT of the abdomen and pelvis is negative for acute findings.  There is no acute intracranial abnormality on head CT and CTA of the head and neck is negative for large vessel occlusion.  Chemistry panel notable for sodium 131, BUN 41, and creatinine 1.06.  CBC features a leukocytosis to 17,300.  Troponin was 155 and then 135.  ED physician discussed case with cardiology (Dr. Myles Gip) and it was recommended that patient be observed on heart monitor overnight and echo repeated in the morning.  Cardiology advised against starting heparin.  Patient was given 304 mg of aspirin, 1 L of saline, Zofran, Reglan, Antivert, and Benadryl in the ED.  She was transferred to [facility].  Review of Systems:  All  other systems reviewed and apart from HPI, are negative.  Past Medical History:  Diagnosis Date   Allergic rhinitis    Arthritis    "maybe a little in my knees and back" (07/17/2018)   Chronic lower back pain    Colitis 12/2013   "never had this before"   COPD (chronic obstructive pulmonary disease) ([place])    "questionable/Dr. Tamala Julian" (07/17/2018)   Coronary artery disease    GI bleed    H/O calcium pyrophosphate deposition disease (CPPD)    Hiatal hernia    Hyperlipidemia    Hypertension    Mild mitral regurgitation    On home oxygen therapy    "I've used it 2 times in 2 yrs" (07/17/2018)   PAD (peripheral artery disease) ([facility])    PAF (paroxysmal atrial fibrillation) ([place])    Pulmonary hypertension ([place])    Shingles     Past Surgical History:  Procedure Laterality Date   ABDOMINAL AORTOGRAM W/LOWER EXTREMITY N/A 07/10/2021   Procedure: ABDOMINAL AORTOGRAM W/LOWER EXTREMITY;  Surgeon: Elam Dutch, MD;  Location: [place];  Service: Cardiovascular;  Laterality: N/A;   ABDOMINAL HYSTERECTOMY  1960's   APPENDECTOMY     BACK SURGERY     BIOPSY  04/20/2021   Procedure: BIOPSY;  Surgeon: Carol Ada, MD;  Location: [facility];  Service: Endoscopy;;   BLEPHAROPLASTY Bilateral 05/2017   CARDIAC CATHETERIZATION     CATARACT EXTRACTION, BILATERAL Bilateral    CESAREAN SECTION  1957; [place] INTERVENTION N/A 07/17/2018  Procedure: CORONARY STENT INTERVENTION;  Surgeon: Belva Crome, MD;  Location: [facility];  Service: Cardiovascular;  Laterality: N/A;   DACROCYSTORHINOSTOMY W/ JONES TUBE Bilateral 05/2017   "put tear duct in my eyes"   ESOPHAGOGASTRODUODENOSCOPY (EGD) WITH PROPOFOL N/A 04/20/2021   Procedure: ESOPHAGOGASTRODUODENOSCOPY (EGD) WITH PROPOFOL;  Surgeon: Carol Ada, MD;  Location: [facility];  Service: Endoscopy;  Laterality: N/A;   EYE SURGERY     LAPAROSCOPIC CHOLECYSTECTOMY  ~ 2009   LUMBAR DISC SURGERY  ?2004   PERIPHERAL  VASCULAR INTERVENTION Left 07/10/2021   Procedure: PERIPHERAL VASCULAR INTERVENTION;  Surgeon: Elam Dutch, MD;  Location: [facility];  Service: Cardiovascular;  Laterality: Left;  common, external iliac arteries   RIGHT/LEFT HEART CATH AND CORONARY ANGIOGRAPHY N/A 07/17/2018   Procedure: RIGHT/LEFT HEART CATH AND CORONARY ANGIOGRAPHY;  Surgeon: Belva Crome, MD;  Location: [place] [facility];  Service: Cardiovascular;  Laterality: N/A;   TONSILLECTOMY AND ADENOIDECTOMY  ?1940's    Social History:   reports that she has never smoked. She has been exposed to tobacco smoke. She has never used smokeless tobacco. She reports current alcohol use of about 4.0 standard drinks of alcohol per week. She reports that she does not use drugs.  Allergies  Allergen Reactions   Brilinta [Ticagrelor] Shortness Of Breath   Codeine Nausea And Vomiting   Darvon [Propoxyphene] Nausea Only   Lipitor [Atorvastatin] Other (See Comments)    MYALGIAS   Rosuvastatin Other (See Comments)    MYALGIAS    Simvastatin Other (See Comments)    MYALGIAS    Tramadol Hcl Nausea Only    nausea (08/2019)    Family History  Problem Relation Age of Onset   Emphysema Father 63   CVA Mother    Cancer Brother        ? type     Prior to Admission medications   Medication Sig Start Date End Date Taking? Authorizing Provider  albuterol (PROVENTIL) (2.5 MG/3ML) 0.083% nebulizer solution Take 3 mLs (2.5 mg total) by nebulization every 2 (two) hours as needed for wheezing or shortness of breath. 08/17/22   Geradine Girt, DO  albuterol (VENTOLIN HFA) 108 (90 Base) MCG/ACT inhaler Inhale 1 puff into the lungs every 6 (six) hours as needed for shortness of breath. 08/11/22   [provider]  apixaban (ELIQUIS) 2.5 MG TABS tablet Take 1 tablet (2.5 mg total) by mouth 2 (two) times daily. 06/18/22   Margie Billet, PA-C  Budeson-Glycopyrrol-Formoterol (BREZTRI AEROSPHERE) 160-9-4.8 MCG/ACT AERO Inhale 2  puffs into the lungs 2 (two) times daily as needed (Shortness of breath/ COPD). 11/15/19   [provider]  denosumab (PROLIA) 60 MG/ML SOSY injection Inject 60 mg as directed every 6 (six) months.    [provider]  diphenhydramine-acetaminophen (TYLENOL PM) 25-500 MG TABS tablet Take 1 tablet by mouth at bedtime.    [provider]  doxycycline (VIBRA-TABS) 100 MG tablet Take 1 tablet (100 mg total) by mouth every 12 (twelve) hours for 14 days. 08/28/22 09/11/22  Dahal, Marlowe Aschoff, MD  Evolocumab (REPATHA SURECLICK) [id number] MG/ML SOAJ INJECT 1 PEN INTO THE SKIN EVERY 14 DAYS Patient taking differently: Inject 140 mg into the skin every 14 (fourteen) days. 07/19/22   Belva Crome, MD  ezetimibe (ZETIA) 10 MG tablet Take 1 tablet by mouth once daily Patient taking differently: Take 10 mg by mouth daily. 03/24/22   Belva Crome, MD  ipratropium-albuterol (DUONEB) 0.5-2.5 (3)  MG/3ML SOLN Take 3 mLs by nebulization 3 (three) times daily. 08/17/22   Geradine Girt, DO  lisinopril-hydrochlorothiazide (ZESTORETIC) 20-25 MG tablet Take 0.5 tablets by mouth in the morning. Patient taking differently: Take 0.5 tablets by mouth daily. 06/18/22   Margie Billet, PA-C  Magnesium Cl-Calcium Carbonate (SLOW-MAG PO) Take 1 tablet by mouth 2 (two) times daily.    [provider]  metoprolol succinate (TOPROL-XL) 25 MG 24 hr tablet Take 1 tablet (25 mg total) by mouth daily. Pt needs to make appt to see provider for further refills - 2nd attempt 06/18/22   Margie Billet, PA-C  metoprolol tartrate (LOPRESSOR) 25 MG tablet Take one half - one tablet (12.5 mg - 25 mg) as needed for Heart Rate 100 or above. Patient taking differently: Take 12.5 mg by mouth daily as needed (For heart rate 100 or above). 08/05/22   Swinyer, Lanice Schwab, NP  nitroGLYCERIN (NITROSTAT) 0.4 MG SL tablet Place 0.4 mg under the tongue every 5 (five) minutes as needed for chest pain.    [provider]   predniSONE (DELTASONE) 10 MG tablet Tak 4 tabs daily for 5 days, then take 3 tabs daily for 5 days, then take 2 tabs daily for 5 days, then take 1 tab daily for 5 days, then STOP. 08/28/22   Dahal, Marlowe Aschoff, MD  tiZANidine (ZANAFLEX) 4 MG capsule Take 4 mg by mouth daily as needed for muscle spasms.    [provider]    Physical Exam: Vitals:   09/11/22 0018 09/11/22 0100 09/11/22 0118 09/11/22 0208  BP: 110/60 (!) 99/52 108/86 130/67  Pulse: 77 72 85 78  Resp: 20 20 20 20   Temp:   98.6 F (37 C) 98.1 F (36.7 C)  TempSrc:   Oral Oral  SpO2: 96% 97% 99% 98%  Weight:    45.5 kg  Height:    4\' 11"  (1.499 m)    Constitutional: NAD, calm  Eyes: PERTLA, lids and conjunctivae normal ENMT: Mucous membranes are moist. Posterior pharynx clear of any exudate or lesions.   Neck: supple, no masses  Respiratory: no wheezing, no crackles. No accessory muscle use.  Cardiovascular: S1 & S2 heard, regular rate and rhythm. No extremity edema.   Abdomen: No distension, no tenderness, soft. Bowel sounds active.  Musculoskeletal: no clubbing / cyanosis. No joint deformity upper and lower extremities.   Skin: no significant rashes, lesions, ulcers. Warm, dry, well-perfused. Neurologic: CN 2-12 grossly intact. Sensation to light touch intact. Strength 5/5 in all 4 limbs. Alert and oriented.  Psychiatric: Pleasant. Cooperative.    Labs and Imaging on Admission: I have personally reviewed following labs and imaging studies  CBC: Recent Labs  Lab 09/10/22 1803  WBC 17.3*  NEUTROABS 11.7*  HGB 13.2  HCT 39.1  MCV 100.0  PLT 99991111   Basic Metabolic Panel: Recent Labs  Lab 09/10/22 1803  NA 131*  K 4.5  CL 93*  CO2 27  GLUCOSE 97  BUN 41*  CREATININE 1.06*  CALCIUM 9.0  MG 2.2   GFR: Estimated Creatinine Clearance: 24.1 mL/min (A) (by C-G formula based on SCr of 1.06 mg/dL (H)). Liver Function Tests: Recent Labs  Lab 09/10/22 1803  AST 18  ALT 19  ALKPHOS 56  BILITOT 0.9   PROT 6.6  ALBUMIN 4.0   Recent Labs  Lab 09/10/22 1803  LIPASE 58*   No results for input(s): "AMMONIA" in the last 168 hours. Coagulation Profile: No results for input(s): "  INR", "PROTIME" in the last 168 hours. Cardiac Enzymes: No results for input(s): "CKTOTAL", "CKMB", "CKMBINDEX", "TROPONINI" in the last 168 hours. BNP (last 3 results) No results for input(s): "PROBNP" in the last 8760 hours. HbA1C: No results for input(s): "HGBA1C" in the last 72 hours. CBG: No results for input(s): "GLUCAP" in the last 168 hours. Lipid Profile: No results for input(s): "CHOL", "HDL", "LDLCALC", "TRIG", "CHOLHDL", "LDLDIRECT" in the last 72 hours. Thyroid Function Tests: No results for input(s): "TSH", "T4TOTAL", "FREET4", "T3FREE", "THYROIDAB" in the last 72 hours. Anemia Panel: No results for input(s): "VITAMINB12", "FOLATE", "FERRITIN", "TIBC", "IRON", "RETICCTPCT" in the last 72 hours. Urine analysis:    Component Value Date/Time   COLORURINE YELLOW 10/05/2021 2127   APPEARANCEUR HAZY (A) 10/05/2021 2127   LABSPEC 1.020 10/05/2021 2127   PHURINE 5.0 10/05/2021 2127   GLUCOSEU NEGATIVE 10/05/2021 2127   HGBUR NEGATIVE 10/05/2021 2127   BILIRUBINUR NEGATIVE 10/05/2021 2127   KETONESUR NEGATIVE 10/05/2021 2127   PROTEINUR NEGATIVE 10/05/2021 2127   UROBILINOGEN 0.2 12/09/2013 1902   NITRITE POSITIVE (A) 10/05/2021 2127   LEUKOCYTESUR LARGE (A) 10/05/2021 2127   Sepsis Labs: @LABRCNTIP (procalcitonin:4,lacticidven:4) ) Recent Results (from the past 240 hour(s))  Resp Panel by RT-PCR (Flu A&B, Covid) Anterior Nasal Swab     Status: None   Collection Time: 09/10/22  6:03 PM   Specimen: Anterior Nasal Swab  Result Value Ref Range Status   SARS Coronavirus 2 by RT PCR NEGATIVE NEGATIVE Final    Comment: (NOTE) SARS-CoV-2 target nucleic acids are NOT DETECTED.  The SARS-CoV-2 RNA is generally detectable in upper respiratory specimens during the acute phase of infection. The  lowest concentration of SARS-CoV-2 viral copies this assay can detect is 138 copies/mL. A negative result does not preclude SARS-Cov-2 infection and should not be used as the sole basis for treatment or other patient management decisions. A negative result may occur with  improper specimen collection/handling, submission of specimen other than nasopharyngeal swab, presence of viral mutation(s) within the areas targeted by this assay, and inadequate number of viral copies(<138 copies/mL). A negative result must be combined with clinical observations, patient history, and epidemiological information. The expected result is Negative.  Fact Sheet for Patients:  EntrepreneurPulse.com.au  Fact Sheet for Healthcare Providers:  IncredibleEmployment.be  This test is no t yet approved or cleared by the [place] FDA and  has been authorized for detection and/or diagnosis of SARS-CoV-2 by FDA under an Emergency Use Authorization (EUA). This EUA will remain  in effect (meaning this test can be used) for the duration of the COVID-19 declaration under Section 564(b)(1) of the Act, 21 U.S.C.section 360bbb-3(b)(1), unless the authorization is terminated  or revoked sooner.       Influenza A by PCR NEGATIVE NEGATIVE Final   Influenza B by PCR NEGATIVE NEGATIVE Final    Comment: (NOTE) The Xpert Xpress SARS-CoV-2/FLU/RSV plus assay is intended as an aid in the diagnosis of influenza from Nasopharyngeal swab specimens and should not be used as a sole basis for treatment. Nasal washings and aspirates are unacceptable for Xpert Xpress SARS-CoV-2/FLU/RSV testing.  Fact Sheet for Patients: EntrepreneurPulse.com.au  Fact Sheet for Healthcare Providers: IncredibleEmployment.be  This test is not yet approved or cleared by the [place] FDA and has been authorized for detection and/or diagnosis of SARS-CoV-2 by FDA under  an Emergency Use Authorization (EUA). This EUA will remain in effect (meaning this test can be used) for the duration of the COVID-19 declaration under Section 564(b)(1) of  the Act, 21 U.S.C. section 360bbb-3(b)(1), unless the authorization is terminated or revoked.  Performed at [facility], [address], [place],  [address]      Radiological Exams on Admission: CT ANGIO HEAD NECK W WO CM  Result Date: 09/10/2022 CLINICAL DATA:  Initial evaluation for acute dizziness. EXAM: CT ANGIOGRAPHY HEAD AND NECK TECHNIQUE: Multidetector CT imaging of the head and neck was performed using the standard protocol during bolus administration of intravenous contrast. Multiplanar CT image reconstructions and MIPs were obtained to evaluate the vascular anatomy. Carotid stenosis measurements (when applicable) are obtained utilizing NASCET criteria, using the distal internal carotid diameter as the denominator. RADIATION DOSE REDUCTION: This exam was performed according to the departmental dose-optimization program which includes automated exposure control, adjustment of the mA and/or kV according to patient size and/or use of iterative reconstruction technique. CONTRAST:  69mL OMNIPAQUE IOHEXOL 350 MG/ML SOLN COMPARISON:  Prior study from 06/23/2012. FINDINGS: CT HEAD FINDINGS Brain: Age-related cerebral atrophy with chronic small vessel ischemic disease. No acute intracranial hemorrhage. No acute large vessel territory infarct. 1.5 cm calcified meningioma overlies anterior left frontal convexity without edema or mass effect. No midline shift or hydrocephalus. No extra-axial fluid collection. Vascular: No hyperdense vessel. Scattered vascular calcifications noted within the carotid siphons. Skull: Scalp soft tissues and calvarium within normal limits. Sinuses/Orbits: Globes orbital soft tissues demonstrate no acute finding. Visualized paranasal sinuses and mastoid air cells are clear.  Other: None. Review of the MIP images confirms the above findings CTA NECK FINDINGS Aortic arch: Visualized aortic arch normal caliber with standard branch pattern. Moderate atheromatous change about the arch itself. No high-grade stenosis about the origin the great vessels. Right carotid system: Right common and internal carotid arteries are tortuous without dissection or occlusion. No hemodynamically significant stenosis about the right carotid artery system within the neck. Left carotid system: She left common and internal carotid arteries are tortuous but patent without dissection or occlusion. No hemodynamically significant stenosis about the left carotid artery system within the neck. Vertebral arteries: Both vertebral arteries arise from subclavian arteries. No proximal subclavian artery stenosis. Vertebral arteries are tortuous but widely patent without dissection or stenosis. Skeleton: No discrete or worrisome osseous lesions. Moderate cervical spondylosis with grade 1 anterolisthesis of C2 on C3. Other neck: No other acute soft tissue abnormality within the neck. Upper chest: Visualized upper chest demonstrates no acute finding. Review of the MIP images confirms the above findings CTA HEAD FINDINGS Anterior circulation: Petrous segments patent bilaterally. Mild for age atheromatous change within the carotid siphons without stenosis. Left A1 patent. Right A1 hypoplastic but grossly patent as well, accounting for the diminutive right ICA is compared to the left. Normal anterior communicating artery complex. Tortuosity and atheromatous irregularity throughout the ACAs without proximal high-grade stenosis. No high-grade M1 stenosis or occlusion. No proximal MCA branch occlusion. Diffuse MCA branches well perfused bilaterally, although demonstrate diffuse atheromatous irregularity. Posterior circulation: Intradural V4 segments are tortuous but patent without stenosis. Both PICA patent. Atheromatous irregularity  within the basilar artery with associated mild to moderate stenoses. Superior cerebral arteries patent bilaterally. Both PCAs primarily supplied via the basilar. Atheromatous irregularity throughout both PCAs with associated moderate to severe left P2 stenoses. Both PCAs remain patent to their distal aspects. Venous sinuses: Patent allowing for timing the contrast bolus. Anatomic variants: None significant.  No aneurysm. Review of the MIP images confirms the above findings IMPRESSION: CT HEAD: 1. No acute intracranial abnormality. 2. Age-related cerebral atrophy with chronic small  vessel ischemic disease. 3. 1.5 cm calcified meningioma overlying the anterior left frontal convexity without edema or mass effect. CTA HEAD AND NECK: 1. Negative CTA for large vessel occlusion or other emergent finding. 2. Moderate intracranial atherosclerotic disease, with most notable findings including moderate to severe left P2 stenoses. No other proximal high-grade or correctable stenosis. 3. Diffuse tortuosity about the major arterial vasculature of the head and neck, suggesting chronic underlying hypertension. Aortic Atherosclerosis (ICD10-I70.0). Electronically Signed   By: Jeannine Boga M.D.   On: 09/10/2022 20:23   CT ABDOMEN PELVIS W CONTRAST  Result Date: 09/10/2022 CLINICAL DATA:  Abdominal pain, nausea, diarrhea, dizziness EXAM: CT ABDOMEN AND PELVIS WITH CONTRAST TECHNIQUE: Multidetector CT imaging of the abdomen and pelvis was performed using the standard protocol following bolus administration of intravenous contrast. RADIATION DOSE REDUCTION: This exam was performed according to the departmental dose-optimization program which includes automated exposure control, adjustment of the mA and/or kV according to patient size and/or use of iterative reconstruction technique. CONTRAST:  59mL OMNIPAQUE IOHEXOL 350 MG/ML SOLN COMPARISON:  06/17/2022 FINDINGS: Lower chest: Lung bases are clear. Hepatobiliary: Liver is  within normal limits, noting focal fat/altered perfusion along the falciform ligament. Status post cholecystectomy. No intrahepatic or extrahepatic ductal dilatation. Pancreas: Within normal limits. Spleen: Within normal limits. Adrenals/Urinary Tract: Adrenal glands are within normal limits. 4.6 cm right lower pole renal cyst (series 3/image 24), measuring simple fluid density, benign (Bosniak I). No follow-up is recommended. 7 mm fat density lesion in the posterior left lower kidney (series 3/image 16), reflecting a benign renal angiomyolipoma. No follow-up is recommended. Mild left renal atrophy. No hydronephrosis. Bladder is within normal limits. Stomach/Bowel: Stomach is within normal limits. No evidence of bowel obstruction. Appendix is not discretely visualized, likely surgically absent. Sigmoid diverticulosis, without evidence of diverticulitis. Vascular/Lymphatic: No evidence of abdominal aortic aneurysm. 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 Left common iliac stent. Atherosclerotic calcifications of the abdominal aorta and branch vessels. No suspicious abdominopelvic lymphadenopathy. Reproductive: Status post hysterectomy. No adnexal masses. Other: No abdominopelvic ascites. Musculoskeletal: Degenerative changes of the visualized thoracolumbar spine. Upper lumbar dextroscoliosis. IMPRESSION: Sigmoid diverticulosis, without evidence of diverticulitis. 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 Left common iliac stent. No CT findings to account for the patient's abdominal pain. Electronically Signed   By: Julian Hy M.D.   On: 09/10/2022 20:01   DG Chest Portable 1 View  Result Date: 09/10/2022 CLINICAL DATA:  Addendum abdominal pain. EXAM: PORTABLE CHEST 1 VIEW COMPARISON:  Chest x-ray 08/24/2022 FINDINGS: The heart size and mediastinal contours are within normal limits. Both lungs are clear. There is levoconvex curvature of the thoracic spine.  IMPRESSION: No active disease. Electronically Signed   By: Ronney Asters M.D.   On: 09/10/2022 18:49    EKG: Independently reviewed. Sinus rhythm, supraventricular bigeminy.   Assessment/Plan   1. Elevated troponin  - Presents with intermittent abdominal pain with N/V/D and is found to have troponin of 155 then 135  - She had mild chest ache on 09/06/22 but none since  - Per cardiology recommendation, will observe on telemetry and repeat echo in am    2. Abdominal pain with N/V/D  - Resolved with promethazine prior to arrival in ED  - No acute findings on CT and exam is benign    3. Hyponatremia  - Serum sodium is 131 on admission in setting of N/V/D with hypovolemia   - She was given a liter of NS  in ED  - Hold HCTZ, continue isotonic IVF and repeat chem panel    4. COPD  - Recovered from recent exacerbation  - Continue prednisone taper and inhalers    5. PAF  - Continue metoprolol and Eliquis    6. CKD IIIa  - SCr is 1.06 on admission, appears close to baseline  - Renally-dose medications, monitor    7. Leukocytosis  - No apparent infectious process, likely from steroids    DVT prophylaxis: sq heparin  Code Status: DNR  Level of Care: Level of care: Telemetry Cardiac Family Communication: none present  Disposition Plan:  Patient is from: home  Anticipated d/c is to: home  Anticipated d/c date is: 09/12/22  Patient currently: Pending cardiac monitoring, echocardiogram  Consults called: none  Admission status: Observation     Vianne Bulls, MD [facility]  09/11/2022, 3:16 AM

## 2022-09-11 NOTE — ED Notes (Signed)
[facility] arrived to transport pt. Pt stable at time of departure. Called pt's daughter to update her on transfer per pt's request.

## 2022-09-11 NOTE — Evaluation (Signed)
Physical Therapy Evaluation Patient Details Name: Sydney Gross MRN: [id number] DOB: 1930/09/08 Today's Date: 09/11/2022  History of Present Illness  88 y.o. female presents to [facility] hospital on 09/10/2022 with abdminal pain, nausea, vomiting, diarrhea, dizziness. Pt found to have elevated troponin, admitted for observation. PMH includes COPD, CAD, PAD, CHF, PAF, CKD III, HTN.  Clinical Impression  Pt presents to PT close to her baseline, able to ambulate independently at this time despite reporting mild weakness. PT anticipates the pt's strength will improve with improvement in sodium levels along with reduction in frequency of diarrhea. PT provides education on the need for frequent ambulation for the remainder of this admission and once discharged home. Pt has HEP from previous HHPT that she will continue. Pt has no further PT needs at this time. Acute PT signing off.     Recommendations for follow up therapy are one component of a multi-disciplinary discharge planning process, led by the attending physician.  Recommendations may be updated based on patient status, additional functional criteria and insurance authorization.  Follow Up Recommendations No PT follow up      Assistance Recommended at Discharge PRN  Patient can return home with the following       Equipment Recommendations None recommended by PT  Recommendations for Other Services       Functional Status Assessment Patient has not had a recent decline in their functional status     Precautions / Restrictions Precautions Precautions: None Restrictions Weight Bearing Restrictions: No      Mobility  Bed Mobility Overal bed mobility: Independent                  Transfers Overall transfer level: Independent                      Ambulation/Gait Ambulation/Gait assistance: Independent Gait Distance (Feet): 150 Feet Assistive device: None Gait Pattern/deviations: WFL(Within Functional  Limits) Gait velocity: functional Gait velocity interpretation: >2.62 ft/sec, indicative of community ambulatory   General Gait Details: mild drift laterally initially, improves with continued ambulation  Stairs Stairs:  (pt declines the need for stair training)          Wheelchair Mobility    Modified Rankin (Stroke Patients Only)       Balance Overall balance assessment: Mild deficits observed, not formally tested                                           Pertinent Vitals/Pain Pain Assessment Pain Assessment: No/denies pain    Home Living Family/patient expects to be discharged to:: Private residence Living Arrangements: Spouse/significant other Available Help at Discharge: Family;Available 24 hours/day Type of Home: House Home Access: Stairs to enter Entrance Stairs-Rails: Right;Left;Can reach both Entrance Stairs-Number of Steps: 6-8   Home Layout: One level Home Equipment: Grab bars - tub/shower;Shower Land (2 wheels);Cane - single point;Rollator (4 wheels)      Prior Function Prior Level of Function : Independent/Modified Independent;Driving                     Hand Dominance   Dominant Hand: Right    Extremity/Trunk Assessment   Upper Extremity Assessment Upper Extremity Assessment: Overall WFL for tasks assessed    Lower Extremity Assessment Lower Extremity Assessment: Overall WFL for tasks assessed    Cervical / Trunk Assessment Cervical / Trunk Assessment:  Normal  Communication   Communication: No difficulties  Cognition Arousal/Alertness: Awake/alert Behavior During Therapy: WFL for tasks assessed/performed Overall Cognitive Status: Within Functional Limits for tasks assessed                                          General Comments General comments (skin integrity, edema, etc.): VSS on RA    Exercises     Assessment/Plan    PT Assessment Patient does not need any further  PT services  PT Problem List         PT Treatment Interventions      PT Goals (Current goals can be found in the Care Plan section)       Frequency       Co-evaluation               AM-PAC PT "6 Clicks" Mobility  Outcome Measure Help needed turning from your back to your side while in a flat bed without using bedrails?: None Help needed moving from lying on your back to sitting on the side of a flat bed without using bedrails?: None Help needed moving to and from a bed to a chair (including a wheelchair)?: None Help needed standing up from a chair using your arms (e.g., wheelchair or bedside chair)?: None Help needed to walk in hospital room?: None Help needed climbing 3-5 steps with a railing? : None 6 Click Score: 24    End of Session   Activity Tolerance: Patient tolerated treatment well Patient left: in bed;with call bell/phone within reach;with family/visitor present Nurse Communication: Mobility status PT Visit Diagnosis: Other abnormalities of gait and mobility (R26.89)    Time: [id number] PT Time Calculation (min) (ACUTE ONLY): 10 min   Charges:   PT Evaluation $PT Eval Low Complexity: 1 Low          Zenaida Niece, PT, DPT Acute Rehabilitation Office [phone]   Zenaida Niece 09/11/2022, 4:12 PM

## 2022-09-11 NOTE — Plan of Care (Signed)

## 2022-09-11 NOTE — ED Notes (Signed)
Attempted to give report. Nurse unable to take report at this time.

## 2022-09-12 DIAGNOSIS — R112 Nausea with vomiting, unspecified: Secondary | ICD-10-CM

## 2022-09-12 DIAGNOSIS — R197 Diarrhea, unspecified: Secondary | ICD-10-CM

## 2022-09-12 DIAGNOSIS — R7989 Other specified abnormal findings of blood chemistry: Secondary | ICD-10-CM | POA: Diagnosis not present

## 2022-09-12 LAB — CBC
HCT: 24 % — ABNORMAL LOW (ref 36.0–46.0)
HCT: 27.6 % — ABNORMAL LOW (ref 36.0–46.0)
HCT: 29.5 % — ABNORMAL LOW (ref 36.0–46.0)
HCT: 29.9 % — ABNORMAL LOW (ref 36.0–46.0)
HCT: 30 % — ABNORMAL LOW (ref 36.0–46.0)
Hemoglobin: 8 g/dL — ABNORMAL LOW (ref 12.0–15.0)
Hemoglobin: 8.8 g/dL — ABNORMAL LOW (ref 12.0–15.0)
Hemoglobin: 9.5 g/dL — ABNORMAL LOW (ref 12.0–15.0)
Hemoglobin: 9.9 g/dL — ABNORMAL LOW (ref 12.0–15.0)
Hemoglobin: 9.8 g/dL — ABNORMAL LOW (ref 12.0–15.0)
MCH: 33.9 pg (ref 26.0–34.0)
MCH: 33.1 pg (ref 26.0–34.0)
MCH: 33.3 pg (ref 26.0–34.0)
MCH: 34.1 pg — ABNORMAL HIGH (ref 26.0–34.0)
MCH: 33.8 pg (ref 26.0–34.0)
MCHC: 33.3 g/dL (ref 30.0–36.0)
MCHC: 31.9 g/dL (ref 30.0–36.0)
MCHC: 32.2 g/dL (ref 30.0–36.0)
MCHC: 33.1 g/dL (ref 30.0–36.0)
MCHC: 32.7 g/dL (ref 30.0–36.0)
MCV: 101.7 fL — ABNORMAL HIGH (ref 80.0–100.0)
MCV: 103.8 fL — ABNORMAL HIGH (ref 80.0–100.0)
MCV: 103.5 fL — ABNORMAL HIGH (ref 80.0–100.0)
MCV: 103.1 fL — ABNORMAL HIGH (ref 80.0–100.0)
MCV: 103.4 fL — ABNORMAL HIGH (ref 80.0–100.0)
Platelets: 151 10*3/uL (ref 150–400)
Platelets: 168 10*3/uL (ref 150–400)
Platelets: 202 10*3/uL (ref 150–400)
Platelets: 205 10*3/uL (ref 150–400)
Platelets: 181 10*3/uL (ref 150–400)
RBC: 2.36 MIL/uL — ABNORMAL LOW (ref 3.87–5.11)
RBC: 2.66 MIL/uL — ABNORMAL LOW (ref 3.87–5.11)
RBC: 2.85 MIL/uL — ABNORMAL LOW (ref 3.87–5.11)
RBC: 2.9 MIL/uL — ABNORMAL LOW (ref 3.87–5.11)
RBC: 2.9 MIL/uL — ABNORMAL LOW (ref 3.87–5.11)
RDW: 13.5 % (ref 11.5–15.5)
RDW: 13.4 % (ref 11.5–15.5)
RDW: 13.7 % (ref 11.5–15.5)
RDW: 13.9 % (ref 11.5–15.5)
RDW: 13.6 % (ref 11.5–15.5)
WBC: 9.7 10*3/uL (ref 4.0–10.5)
WBC: 12.7 10*3/uL — ABNORMAL HIGH (ref 4.0–10.5)
WBC: 13.2 10*3/uL — ABNORMAL HIGH (ref 4.0–10.5)
WBC: 13.2 10*3/uL — ABNORMAL HIGH (ref 4.0–10.5)
WBC: 12.4 10*3/uL — ABNORMAL HIGH (ref 4.0–10.5)
nRBC: 0 % (ref 0.0–0.2)
nRBC: 0 % (ref 0.0–0.2)
nRBC: 0 % (ref 0.0–0.2)
nRBC: 0 % (ref 0.0–0.2)
nRBC: 0 % (ref 0.0–0.2)

## 2022-09-12 LAB — BASIC METABOLIC PANEL
Anion gap: 8 (ref 5–15)
BUN: 18 mg/dL (ref 8–23)
CO2: 21 mmol/L — ABNORMAL LOW (ref 22–32)
Calcium: 7.1 mg/dL — ABNORMAL LOW (ref 8.9–10.3)
Chloride: 105 mmol/L (ref 98–111)
Creatinine, Ser: 0.87 mg/dL (ref 0.44–1.00)
GFR, Estimated: >60 mL/min (ref >60)
Glucose, Bld: 133 mg/dL — ABNORMAL HIGH (ref 70–99)
Potassium: 3.9 mmol/L (ref 3.5–5.1)
Sodium: 134 mmol/L — ABNORMAL LOW (ref 135–145)

## 2022-09-12 LAB — OCCULT BLOOD X 1 CARD TO LAB, STOOL: Fecal Occult Bld: POSITIVE — AB

## 2022-09-12 LAB — COMPREHENSIVE METABOLIC PANEL
ALT: 13 U/L (ref 0–44)
AST: 15 U/L (ref 15–41)
Albumin: 1.9 g/dL — ABNORMAL LOW (ref 3.5–5.0)
Alkaline Phosphatase: 33 U/L — ABNORMAL LOW (ref 38–126)
Anion gap: 5 (ref 5–15)
BUN: 20 mg/dL (ref 8–23)
CO2: 21 mmol/L — ABNORMAL LOW (ref 22–32)
Calcium: 6.8 mg/dL — ABNORMAL LOW (ref 8.9–10.3)
Chloride: 106 mmol/L (ref 98–111)
Creatinine, Ser: 0.9 mg/dL (ref 0.44–1.00)
GFR, Estimated: >60 mL/min (ref >60)
Glucose, Bld: 139 mg/dL — ABNORMAL HIGH (ref 70–99)
Potassium: 3.9 mmol/L (ref 3.5–5.1)
Sodium: 132 mmol/L — ABNORMAL LOW (ref 135–145)
Total Bilirubin: 0.4 mg/dL (ref 0.3–1.2)
Total Protein: 3.5 g/dL — ABNORMAL LOW (ref 6.5–8.1)

## 2022-09-12 MED ORDER — PANTOPRAZOLE SODIUM 40 MG IV SOLR
40.0000 mg | Freq: Two times a day (BID) | INTRAVENOUS | Status: DC
  Administered 2022-09-12: 40 mg via INTRAVENOUS
  Filled 2022-09-12 (×2): qty 10

## 2022-09-12 MED ORDER — MIDODRINE HCL 5 MG PO TABS
10.0000 mg | ORAL_TABLET | Freq: Three times a day (TID) | ORAL | Status: DC
  Administered 2022-09-12: 10 mg via ORAL
  Filled 2022-09-12 (×2): qty 2

## 2022-09-12 MED ORDER — MIDODRINE HCL 5 MG PO TABS
5.0000 mg | ORAL_TABLET | Freq: Three times a day (TID) | ORAL | Status: AC
  Administered 2022-09-12: 5 mg via ORAL
  Filled 2022-09-12: qty 1

## 2022-09-12 MED ORDER — SODIUM CHLORIDE 0.9 % IV BOLUS
1000.0000 mL | INTRAVENOUS | Status: AC
  Administered 2022-09-12: 1000 mL via INTRAVENOUS

## 2022-09-12 MED ORDER — PANTOPRAZOLE SODIUM 40 MG IV SOLR
40.0000 mg | INTRAVENOUS | Status: AC
  Administered 2022-09-12: 40 mg via INTRAVENOUS
  Filled 2022-09-12: qty 10

## 2022-09-12 MED ORDER — IPRATROPIUM-ALBUTEROL 0.5-2.5 (3) MG/3ML IN SOLN
3.0000 mL | Freq: Four times a day (QID) | RESPIRATORY_TRACT | Status: DC | PRN
  Administered 2022-09-14: 3 mL via RESPIRATORY_TRACT
  Filled 2022-09-12: qty 3

## 2022-09-12 MED ORDER — MIDODRINE HCL 5 MG PO TABS
10.0000 mg | ORAL_TABLET | ORAL | Status: AC
  Administered 2022-09-12: 10 mg via ORAL
  Filled 2022-09-12: qty 2

## 2022-09-12 MED ORDER — PANTOPRAZOLE SODIUM 40 MG PO TBEC
40.0000 mg | DELAYED_RELEASE_TABLET | Freq: Every day | ORAL | Status: DC
  Administered 2022-09-12 – 2022-09-14 (×3): 40 mg via ORAL
  Filled 2022-09-12 (×3): qty 1

## 2022-09-12 NOTE — Progress Notes (Signed)
Mobility Specialist - Progress Note   09/12/22 1539  Mobility  Activity Ambulated with assistance in hallway  Activity Response Tolerated well  Distance Ambulated (ft) 180 ft  $Mobility charge 1 Mobility  Level of Assistance Standby assist, set-up cues, supervision of patient - no hands on  Assistive Device Other (Comment) (IV pole)  Mobility Referral Yes    Post-mobility: 127/82 BP  Pt was received in bed and agreeable to mobility. No complaints throughout ambulation. Pt was returned to EOB with all needs met.  Larey Seat

## 2022-09-12 NOTE — Plan of Care (Signed)

## 2022-09-12 NOTE — Consult Note (Signed)
Reason for Consult: Nausea and anemia Referring Physician: [facility]  Ubaldo Glassing HPI: This is a 88 year old female with a PMH of a clean-based antral ulcer (06/2021), PAF, COPD, HTN, and CAD s/p stenting admitted for complaints of nausea.  Her symptoms started acutely this past Monday.  At that time she was not able to tolerate any PO, but she denied any problems with vomiting.  Her nausea was associated with a brief diarrhea that lasted from Monday to Tuesday.  The nausea continued to persist and she presented to the ER on 09/10/2022.  The work up was unremarkable for any GI source of her symptoms and she denied any sick contacts.  She was in the hospital recently for an COPD exacerbation and she was discharged on 08/28/2022 with 2 weeks of doxycycline and a tapering course of prednisone.  With IV hydration during this hospitalization her HGB was noted to decline from her baseline of 11 down to the 8 g/dL range.  There were no reports of hematochezia or melena.  Her presentation in 06/2021 was positive for melena and an anemia, which was secondary to an NSAID ulcer in the antrum of the stomach.  Past Medical History:  Diagnosis Date   Allergic rhinitis    Arthritis    "maybe a little in my knees and back" (07/17/2018)   Chronic lower back pain    Colitis 12/2013   "never had this before"   COPD (chronic obstructive pulmonary disease) ([place])    "questionable/Dr. Tamala Julian" (07/17/2018)   Coronary artery disease    GI bleed    H/O calcium pyrophosphate deposition disease (CPPD)    Hiatal hernia    Hyperlipidemia    Hypertension    Mild mitral regurgitation    On home oxygen therapy    "I've used it 2 times in 2 yrs" (07/17/2018)   PAD (peripheral artery disease) ([facility])    PAF (paroxysmal atrial fibrillation) ([place])    Pulmonary hypertension ([place])    Shingles     Past Surgical History:  Procedure Laterality Date   ABDOMINAL AORTOGRAM W/LOWER EXTREMITY N/A 07/10/2021   Procedure:  ABDOMINAL AORTOGRAM W/LOWER EXTREMITY;  Surgeon: Elam Dutch, MD;  Location: [place] [facility];  Service: Cardiovascular;  Laterality: N/A;   ABDOMINAL HYSTERECTOMY  1960's   APPENDECTOMY     BACK SURGERY     BIOPSY  04/20/2021   Procedure: BIOPSY;  Surgeon: Carol Ada, MD;  Location: [facility];  Service: Endoscopy;;   BLEPHAROPLASTY Bilateral 05/2017   CARDIAC CATHETERIZATION     CATARACT EXTRACTION, BILATERAL Bilateral    CESAREAN SECTION  1957; 1960   CORONARY STENT INTERVENTION N/A 07/17/2018   Procedure: CORONARY STENT INTERVENTION;  Surgeon: Belva Crome, MD;  Location: [place] [facility];  Service: Cardiovascular;  Laterality: N/A;   DACROCYSTORHINOSTOMY W/ JONES TUBE Bilateral 05/2017   "put tear duct in my eyes"   ESOPHAGOGASTRODUODENOSCOPY (EGD) WITH PROPOFOL N/A 04/20/2021   Procedure: ESOPHAGOGASTRODUODENOSCOPY (EGD) WITH PROPOFOL;  Surgeon: Carol Ada, MD;  Location: [facility];  Service: Endoscopy;  Laterality: N/A;   EYE SURGERY     LAPAROSCOPIC CHOLECYSTECTOMY  ~ 2009   LUMBAR DISC SURGERY  ?2004   PERIPHERAL VASCULAR INTERVENTION Left 07/10/2021   Procedure: PERIPHERAL VASCULAR INTERVENTION;  Surgeon: Elam Dutch, MD;  Location: [place] [facility];  Service: Cardiovascular;  Laterality: Left;  common, external iliac arteries   RIGHT/LEFT HEART CATH AND CORONARY ANGIOGRAPHY N/A 07/17/2018   Procedure: RIGHT/LEFT HEART  CATH AND CORONARY ANGIOGRAPHY;  Surgeon: Belva Crome, MD;  Location: [place] [facility];  Service: Cardiovascular;  Laterality: N/A;   TONSILLECTOMY AND ADENOIDECTOMY  ?83's    Family History  Problem Relation Age of Onset   Emphysema Father 61   CVA Mother    Cancer Brother        ? type    Social History:  reports that she has never smoked. She has been exposed to tobacco smoke. She has never used smokeless tobacco. She reports current alcohol use of about 4.0 standard drinks of alcohol per week. She reports that she does not  use drugs.  Allergies:  Allergies  Allergen Reactions   Brilinta [Ticagrelor] Shortness Of Breath   Codeine Nausea And Vomiting   Darvon [Propoxyphene] Nausea Only   Lipitor [Atorvastatin] Other (See Comments)    MYALGIAS   Rosuvastatin Other (See Comments)    MYALGIAS    Simvastatin Other (See Comments)    MYALGIAS    Tramadol Hcl Nausea Only    nausea (08/2019)    Medications: Scheduled:  fluticasone furoate-vilanterol  1 puff Inhalation Daily   And   umeclidinium bromide  1 puff Inhalation Daily   influenza vaccine adjuvanted  0.5 mL Intramuscular Tomorrow-1000   magnesium chloride  1 tablet Oral BID   midodrine  10 mg Oral TID WC   pantoprazole (PROTONIX) IV  40 mg Intravenous Q12H   predniSONE  20 mg Oral Q breakfast   Continuous:  sodium chloride 75 mL/hr at 09/12/22 0919    Results for orders placed or performed during the hospital encounter of 09/10/22 (from the past 24 hour(s))  Troponin I (High Sensitivity)     Status: Abnormal   Collection Time: 09/11/22 10:32 AM  Result Value Ref Range   Troponin I (High Sensitivity) 97 (H) <18 ng/L  CBC     Status: Abnormal   Collection Time: 09/11/22 10:32 AM  Result Value Ref Range   WBC 13.5 (H) 4.0 - 10.5 K/uL   RBC 3.45 (L) 3.87 - 5.11 MIL/uL   Hemoglobin 11.5 (L) 12.0 - 15.0 g/dL   HCT 35.7 (L) 36.0 - 46.0 %   MCV 103.5 (H) 80.0 - 100.0 fL   MCH 33.3 26.0 - 34.0 pg   MCHC 32.2 30.0 - 36.0 g/dL   RDW 13.5 11.5 - 15.5 %   Platelets 201 150 - 400 K/uL   nRBC 0.0 0.0 - 0.2 %  Basic metabolic panel     Status: Abnormal   Collection Time: 09/11/22 10:32 AM  Result Value Ref Range   Sodium 134 (L) 135 - 145 mmol/L   Potassium 4.4 3.5 - 5.1 mmol/L   Chloride 102 98 - 111 mmol/L   CO2 24 22 - 32 mmol/L   Glucose, Bld 116 (H) 70 - 99 mg/dL   BUN 24 (H) 8 - 23 mg/dL   Creatinine, Ser 0.87 0.44 - 1.00 mg/dL   Calcium 8.0 (L) 8.9 - 10.3 mg/dL   GFR, Estimated >60 >60 mL/min   Anion gap 8 5 - 15  Troponin I (High  Sensitivity)     Status: Abnormal   Collection Time: 09/11/22 12:39 PM  Result Value Ref Range   Troponin I (High Sensitivity) 87 (H) <18 ng/L  CBC     Status: Abnormal   Collection Time: 09/12/22  1:56 AM  Result Value Ref Range   WBC 9.7 4.0 - 10.5 K/uL   RBC 2.36 (L) 3.87 - 5.11  MIL/uL   Hemoglobin 8.0 (L) 12.0 - 15.0 g/dL   HCT 24.0 (L) 36.0 - 46.0 %   MCV 101.7 (H) 80.0 - 100.0 fL   MCH 33.9 26.0 - 34.0 pg   MCHC 33.3 30.0 - 36.0 g/dL   RDW 13.5 11.5 - 15.5 %   Platelets 151 150 - 400 K/uL   nRBC 0.0 0.0 - 0.2 %  Comprehensive metabolic panel     Status: Abnormal   Collection Time: 09/12/22  1:56 AM  Result Value Ref Range   Sodium 132 (L) 135 - 145 mmol/L   Potassium 3.9 3.5 - 5.1 mmol/L   Chloride 106 98 - 111 mmol/L   CO2 21 (L) 22 - 32 mmol/L   Glucose, Bld 139 (H) 70 - 99 mg/dL   BUN 20 8 - 23 mg/dL   Creatinine, Ser 0.90 0.44 - 1.00 mg/dL   Calcium 6.8 (L) 8.9 - 10.3 mg/dL   Total Protein 3.5 (L) 6.5 - 8.1 g/dL   Albumin 1.9 (L) 3.5 - 5.0 g/dL   AST 15 15 - 41 U/L   ALT 13 0 - 44 U/L   Alkaline Phosphatase 33 (L) 38 - 126 U/L   Total Bilirubin 0.4 0.3 - 1.2 mg/dL   GFR, Estimated >60 >60 mL/min   Anion gap 5 5 - 15  Basic metabolic panel     Status: Abnormal   Collection Time: 09/12/22  4:05 AM  Result Value Ref Range   Sodium 134 (L) 135 - 145 mmol/L   Potassium 3.9 3.5 - 5.1 mmol/L   Chloride 105 98 - 111 mmol/L   CO2 21 (L) 22 - 32 mmol/L   Glucose, Bld 133 (H) 70 - 99 mg/dL   BUN 18 8 - 23 mg/dL   Creatinine, Ser 0.87 0.44 - 1.00 mg/dL   Calcium 7.1 (L) 8.9 - 10.3 mg/dL   GFR, Estimated >60 >60 mL/min   Anion gap 8 5 - 15  CBC     Status: Abnormal   Collection Time: 09/12/22  4:05 AM  Result Value Ref Range   WBC 12.7 (H) 4.0 - 10.5 K/uL   RBC 2.66 (L) 3.87 - 5.11 MIL/uL   Hemoglobin 8.8 (L) 12.0 - 15.0 g/dL   HCT 27.6 (L) 36.0 - 46.0 %   MCV 103.8 (H) 80.0 - 100.0 fL   MCH 33.1 26.0 - 34.0 pg   MCHC 31.9 30.0 - 36.0 g/dL   RDW 13.4 11.5 -  15.5 %   Platelets 168 150 - 400 K/uL   nRBC 0.0 0.0 - 0.2 %     ECHOCARDIOGRAM COMPLETE  Result Date: 09/11/2022    ECHOCARDIOGRAM REPORT   Patient Name:   NIAJAH MAUEL Date of Exam: 09/11/2022 Medical Rec #:  [id number]         Height:       59.0 in Accession #:    [id number]        Weight:       100.3 lb Date of Birth:  06/11/1930         BSA:          1.376 m Patient Age:    72 years          BP:           132/72 mmHg Patient Gender: F                 HR:  88 bpm. Exam Location:  Inpatient Procedure: 2D Echo, Cardiac Doppler and Color Doppler Indications:    Elevated Troponin  History:        Patient has prior history of Echocardiogram examinations, most                 recent 06/17/2022. CAD, COPD and Pulmonary HTN, Arrythmias:Atrial                 Fibrillation; Risk Factors:Dyslipidemia and Hypertension.  Sonographer:    Bernadene Person RDCS Referring Phys: [id number] [place]  1. Left ventricular ejection fraction, by estimation, is 65 to 70%. The left ventricle has normal function. The left ventricle has no regional wall motion abnormalities. There is mild left ventricular hypertrophy of the basal-septal segment. Left ventricular diastolic function could not be evaluated.  2. Right ventricular systolic function is normal. The right ventricular size is normal. There is mildly elevated pulmonary artery systolic pressure. The estimated right ventricular systolic pressure is 85.2 mmHg.  3. Left atrial size was moderately dilated.  4. The mitral valve is normal in structure. Mild mitral valve regurgitation. No evidence of mitral stenosis.  5. Tricuspid valve regurgitation is mild to moderate.  6. The aortic valve is tricuspid. There is moderate calcification of the aortic valve. There is moderate thickening of the aortic valve. Aortic valve regurgitation is mild. Mild aortic valve stenosis. Aortic regurgitation PHT measures 584 msec. Aortic valve area, by VTI measures 1.39 cm.  Aortic valve mean gradient measures 16.0 mmHg. Aortic valve Vmax measures 2.87 m/s.  7. The inferior vena cava is normal in size with greater than 50% respiratory variability, suggesting right atrial pressure of 3 mmHg.  8. A small pericardial effusion is present. The pericardial effusion is anterior to the right ventricle. There is no evidence of cardiac tamponade. FINDINGS  Left Ventricle: Left ventricular ejection fraction, by estimation, is 65 to 70%. The left ventricle has normal function. The left ventricle has no regional wall motion abnormalities. The left ventricular internal cavity size was normal in size. There is  mild left ventricular hypertrophy of the basal-septal segment. Left ventricular diastolic function could not be evaluated. Right Ventricle: The right ventricular size is normal. No increase in right ventricular wall thickness. Right ventricular systolic function is normal. There is mildly elevated pulmonary artery systolic pressure. The tricuspid regurgitant velocity is 2.96  m/s, and with an assumed right atrial pressure of 3 mmHg, the estimated right ventricular systolic pressure is 77.8 mmHg. Left Atrium: Left atrial size was moderately dilated. Right Atrium: Right atrial size was normal in size. Pericardium: A small pericardial effusion is present. The pericardial effusion is anterior to the right ventricle. There is no evidence of cardiac tamponade. Mitral Valve: The mitral valve is normal in structure. Mild mitral valve regurgitation. No evidence of mitral valve stenosis. Tricuspid Valve: The tricuspid valve is normal in structure. Tricuspid valve regurgitation is mild to moderate. No evidence of tricuspid stenosis. Aortic Valve: The aortic valve is tricuspid. There is moderate calcification of the aortic valve. There is moderate thickening of the aortic valve. Aortic valve regurgitation is mild. Aortic regurgitation PHT measures 584 msec. Mild aortic stenosis is present. Aortic valve  mean gradient measures 16.0 mmHg. Aortic valve peak gradient measures 32.9 mmHg. Aortic valve area, by VTI measures 1.39 cm. Pulmonic Valve: The pulmonic valve was normal in structure. Pulmonic valve regurgitation is mild. No evidence of pulmonic stenosis. Aorta: The aortic root is normal in size and structure.  Venous: The inferior vena cava is normal in size with greater than 50% respiratory variability, suggesting right atrial pressure of 3 mmHg. IAS/Shunts: No atrial level shunt detected by color flow Doppler.  LEFT VENTRICLE PLAX 2D LVIDd:         3.10 cm LVIDs:         1.60 cm LV PW:         1.10 cm LV IVS:        1.20 cm LVOT diam:     1.80 cm LV SV:         69 LV SV Index:   50 LVOT Area:     2.54 cm  LV Volumes (MOD) LV vol d, MOD A2C: 36.5 ml LV vol d, MOD A4C: 43.5 ml LV vol s, MOD A2C: 14.1 ml LV vol s, MOD A4C: 12.2 ml LV SV MOD A2C:     22.4 ml LV SV MOD A4C:     43.5 ml LV SV MOD BP:      26.1 ml RIGHT VENTRICLE TAPSE (M-mode): 1.4 cm LEFT ATRIUM             Index        RIGHT ATRIUM          Index LA diam:        4.20 cm 3.05 cm/m   RA Area:     8.96 cm LA Vol (A2C):   54.7 ml 39.77 ml/m  RA Volume:   14.50 ml 10.54 ml/m LA Vol (A4C):   60.6 ml 44.06 ml/m LA Biplane Vol: 61.5 ml 44.71 ml/m  AORTIC VALVE AV Area (Vmax):    1.57 cm AV Area (Vmean):   1.61 cm AV Area (VTI):     1.39 cm AV Vmax:           287.00 cm/s AV Vmean:          183.000 cm/s AV VTI:            0.493 m AV Peak Grad:      32.9 mmHg AV Mean Grad:      16.0 mmHg LVOT Vmax:         177.00 cm/s LVOT Vmean:        115.500 cm/s LVOT VTI:          0.270 m LVOT/AV VTI ratio: 0.55 AI PHT:            584 msec  AORTA Ao Root diam: 2.80 cm Ao Asc diam:  2.80 cm TRICUSPID VALVE TR Peak grad:   35.0 mmHg TR Vmax:        296.00 cm/s  SHUNTS Systemic VTI:  0.27 m Systemic Diam: 1.80 cm Fransico Him MD Electronically signed by Fransico Him MD Signature Date/Time: 09/11/2022/10:52:13 AM    Final    CT ANGIO HEAD NECK W WO CM  Result  Date: 09/10/2022 CLINICAL DATA:  Initial evaluation for acute dizziness. EXAM: CT ANGIOGRAPHY HEAD AND NECK TECHNIQUE: Multidetector CT imaging of the head and neck was performed using the standard protocol during bolus administration of intravenous contrast. Multiplanar CT image reconstructions and MIPs were obtained to evaluate the vascular anatomy. Carotid stenosis measurements (when applicable) are obtained utilizing NASCET criteria, using the distal internal carotid diameter as the denominator. RADIATION DOSE REDUCTION: This exam was performed according to the departmental dose-optimization program which includes automated exposure control, adjustment of the mA and/or kV according to patient size and/or use of iterative reconstruction technique. CONTRAST:  15mL OMNIPAQUE  IOHEXOL 350 MG/ML SOLN COMPARISON:  Prior study from 06/23/2012. FINDINGS: CT HEAD FINDINGS Brain: Age-related cerebral atrophy with chronic small vessel ischemic disease. No acute intracranial hemorrhage. No acute large vessel territory infarct. 1.5 cm calcified meningioma overlies anterior left frontal convexity without edema or mass effect. No midline shift or hydrocephalus. No extra-axial fluid collection. Vascular: No hyperdense vessel. Scattered vascular calcifications noted within the carotid siphons. Skull: Scalp soft tissues and calvarium within normal limits. Sinuses/Orbits: Globes orbital soft tissues demonstrate no acute finding. Visualized paranasal sinuses and mastoid air cells are clear. Other: None. Review of the MIP images confirms the above findings CTA NECK FINDINGS Aortic arch: Visualized aortic arch normal caliber with standard branch pattern. Moderate atheromatous change about the arch itself. No high-grade stenosis about the origin the great vessels. Right carotid system: Right common and internal carotid arteries are tortuous without dissection or occlusion. No hemodynamically significant stenosis about the right carotid  artery system within the neck. Left carotid system: She left common and internal carotid arteries are tortuous but patent without dissection or occlusion. No hemodynamically significant stenosis about the left carotid artery system within the neck. Vertebral arteries: Both vertebral arteries arise from subclavian arteries. No proximal subclavian artery stenosis. Vertebral arteries are tortuous but widely patent without dissection or stenosis. Skeleton: No discrete or worrisome osseous lesions. Moderate cervical spondylosis with grade 1 anterolisthesis of C2 on C3. Other neck: No other acute soft tissue abnormality within the neck. Upper chest: Visualized upper chest demonstrates no acute finding. Review of the MIP images confirms the above findings CTA HEAD FINDINGS Anterior circulation: Petrous segments patent bilaterally. Mild for age atheromatous change within the carotid siphons without stenosis. Left A1 patent. Right A1 hypoplastic but grossly patent as well, accounting for the diminutive right ICA is compared to the left. Normal anterior communicating artery complex. Tortuosity and atheromatous irregularity throughout the ACAs without proximal high-grade stenosis. No high-grade M1 stenosis or occlusion. No proximal MCA branch occlusion. Diffuse MCA branches well perfused bilaterally, although demonstrate diffuse atheromatous irregularity. Posterior circulation: Intradural V4 segments are tortuous but patent without stenosis. Both PICA patent. Atheromatous irregularity within the basilar artery with associated mild to moderate stenoses. Superior cerebral arteries patent bilaterally. Both PCAs primarily supplied via the basilar. Atheromatous irregularity throughout both PCAs with associated moderate to severe left P2 stenoses. Both PCAs remain patent to their distal aspects. Venous sinuses: Patent allowing for timing the contrast bolus. Anatomic variants: None significant.  No aneurysm. Review of the MIP images  confirms the above findings IMPRESSION: CT HEAD: 1. No acute intracranial abnormality. 2. Age-related cerebral atrophy with chronic small vessel ischemic disease. 3. 1.5 cm calcified meningioma overlying the anterior left frontal convexity without edema or mass effect. CTA HEAD AND NECK: 1. Negative CTA for large vessel occlusion or other emergent finding. 2. Moderate intracranial atherosclerotic disease, with most notable findings including moderate to severe left P2 stenoses. No other proximal high-grade or correctable stenosis. 3. Diffuse tortuosity about the major arterial vasculature of the head and neck, suggesting chronic underlying hypertension. Aortic Atherosclerosis (ICD10-I70.0). Electronically Signed   By: Jeannine Boga M.D.   On: 09/10/2022 20:23   CT ABDOMEN PELVIS W CONTRAST  Result Date: 09/10/2022 CLINICAL DATA:  Abdominal pain, nausea, diarrhea, dizziness EXAM: CT ABDOMEN AND PELVIS WITH CONTRAST TECHNIQUE: Multidetector CT imaging of the abdomen and pelvis was performed using the standard protocol following bolus administration of intravenous contrast. RADIATION DOSE REDUCTION: This exam was performed according to the departmental dose-optimization program  which includes automated exposure control, adjustment of the mA and/or kV according to patient size and/or use of iterative reconstruction technique. CONTRAST:  79mL OMNIPAQUE IOHEXOL 350 MG/ML SOLN COMPARISON:  06/17/2022 FINDINGS: Lower chest: Lung bases are clear. Hepatobiliary: Liver is within normal limits, noting focal fat/altered perfusion along the falciform ligament. Status post cholecystectomy. No intrahepatic or extrahepatic ductal dilatation. Pancreas: Within normal limits. Spleen: Within normal limits. Adrenals/Urinary Tract: Adrenal glands are within normal limits. 4.6 cm right lower pole renal cyst (series 3/image 24), measuring simple fluid density, benign (Bosniak I). No follow-up is recommended. 7 mm fat density  lesion in the posterior left lower kidney (series 3/image 16), reflecting a benign renal angiomyolipoma. No follow-up is recommended. Mild left renal atrophy. No hydronephrosis. Bladder is within normal limits. Stomach/Bowel: Stomach is within normal limits. No evidence of bowel obstruction. Appendix is not discretely visualized, likely surgically absent. Sigmoid diverticulosis, without evidence of diverticulitis. Vascular/Lymphatic: No evidence of abdominal aortic aneurysm. 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 Left common iliac stent. Atherosclerotic calcifications of the abdominal aorta and branch vessels. No suspicious abdominopelvic lymphadenopathy. Reproductive: Status post hysterectomy. No adnexal masses. Other: No abdominopelvic ascites. Musculoskeletal: Degenerative changes of the visualized thoracolumbar spine. Upper lumbar dextroscoliosis. IMPRESSION: Sigmoid diverticulosis, without evidence of diverticulitis. 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 Left common iliac stent. No CT findings to account for the patient's abdominal pain. Electronically Signed   By: Julian Hy M.D.   On: 09/10/2022 20:01   DG Chest Portable 1 View  Result Date: 09/10/2022 CLINICAL DATA:  Addendum abdominal pain. EXAM: PORTABLE CHEST 1 VIEW COMPARISON:  Chest x-ray 08/24/2022 FINDINGS: The heart size and mediastinal contours are within normal limits. Both lungs are clear. There is levoconvex curvature of the thoracic spine. IMPRESSION: No active disease. Electronically Signed   By: Ronney Asters M.D.   On: 09/10/2022 18:49    ROS:  As stated above in the HPI otherwise negative.  Blood pressure 126/79, pulse 65, temperature 97.7 F (36.5 C), temperature source Oral, resp. rate 16, height 4\' 11"  (1.499 m), weight 45.5 kg, SpO2 98 %.    PE: Gen: NAD, Alert and Oriented HEENT:  [place]/AT, EOMI Neck: Supple, no LAD Lungs: CTA Bilaterally CV: RRR without M/G/R ABD:  Soft, NTND, +BS Ext: No C/C/E  Assessment/Plan: 1) Nausea. 2) Anemia. 3) Recent COPD exacerbation.   Her nausea can be multifactorial.  With her recent treatment of doxycycline and prednisone, a Candidal esophagitis can be a source.  Using doxycycline itself can cause some nausea issues.  There is a history of a gastric ulcer, and with her anemia, PUD is another consideration.  Plan: 1) Pantoprazole. 2) Symptomatic treatment for now. 3) Dr. Collene Mares will round in the patient tomorrow.  Antar Milks D 09/12/2022, 9:51 AM

## 2022-09-12 NOTE — Progress Notes (Addendum)
Received a call from bedside RN regarding the patient being severely hypotensive with MAP in the 40s.  1 L normal saline IV fluid bolus ordered to be administered stat.  Home oral antihypertensives lisinopril and Toprol-XL DC'd for now.  1 dose of midodrine 10 mg x 1 was ordered stat.  Presented at bedside.  The patient is alert and responds to questions appropriately.  Denies having any pain.  She has minimal tenderness at epigastric region with moderate palpation.  Patient's labs drawn yesterday with downtrending hemoglobin.  Repeating CBC and obtaining FOBT.  History of of nonbleeding gastric ulcer and erythematous duodenopathy seen on EGD done on 04/20/2021.  IV Protonix 40 mg x 1 ordered stat to be administered.  In the room, BP is improving with IV fluid and p.o. vasopressor, midodrine.  Goal MAP greater than 65.  Continue IV fluid, midodrine 10 mg 3 times daily x3 doses.  We will continue to closely follow and treat as indicated.  Addendum: Drop in hemoglobin with concern for acute blood loss anemia.  IV Protonix given twice.  Consulted GI to assist with the management.  Hypotensive, received total of 2 L normal saline IV fluid bolus during this shift.  Also received midodrine 10 mg x 2 to maintain MAP greater than 65.  Made NPO except sips and meds until seen by GI.

## 2022-09-12 NOTE — Progress Notes (Signed)
PROGRESS NOTE    Sydney Gross  [id number] DOB: 1930-06-14 DOA: 09/10/2022 PCP: Mayra Neer, MD     Brief Narrative:  Sydney Gross is a very pleasant 88 y.o. female with medical history significant for COPD, CAD, PAD, chronic diastolic CHF, PAF on Eliquis, CKD 3A, hypertension, and recent admission for COPD exacerbation who presents to the emergency department with abdominal pain, nausea, vomiting, diarrhea, and dizziness. Patient reports 1 week of intermittent upper abdominal pain with nausea, nonbloody vomiting, and some loose stools.  She was also experiencing a room spinning sensation.  She had a mild ache in her chest on 09/06/2022 but no chest discomfort since.  CT of the abdomen and pelvis is negative for acute findings.  There is no acute intracranial abnormality on head CT and CTA of the head and neck is negative for large vessel occlusion. ED physician discussed case with cardiology (Dr. Myles Gip) and it was recommended that patient be observed on heart monitor overnight and echo repeated in the morning.  Cardiology advised against starting heparin.  New events last 24 hours / Subjective: Overnight, patient had episode of hypotension requiring IV fluid and midodrine.  This morning, patient is feeling better, blood pressure has improved.  Denies any more nausea or vomiting.  Has not had bowel movement since admission.  Has some dizziness.  Assessment & Plan:   Principal Problem:   Nausea, vomiting and diarrhea Active Problems:   Stage 3a chronic kidney disease (CKD) (HCC)   COPD (chronic obstructive pulmonary disease) (HCC)   Chronic diastolic CHF (congestive heart failure) (HCC)   Abdominal pain   Paroxysmal atrial fibrillation (HCC)   Elevated troponin   Hyponatremia   Nausea, vomiting, diarrhea, abdominal pain -Could be secondary to use of doxycycline, prednisone, peptic ulcer disease -Improved -PPI  Hypotension -Question possible GI bleeding.   -Blood  pressure improved.  Continue to hold HCTZ, lisinopril, metoprolol -Midodrine, wean   Possible GI bleeding -Previous history of nonbleeding gastric ulcer in 2022 -Baseline Hgb 11-13  -Hold Eliquis -Check FOBT -Trend CBC q6h -GI consulted   Demand ischemia -Troponin has trended downward 155>135>97>87  -Echocardiogram EF 65 to 70%, no wall motion abnormality -No chest pain   Paroxysmal A-fib -Holding metoprolol, Eliquis  COPD -Had recent exacerbation, is on prednisone taper, breathing tx   CKD ruled out    DVT prophylaxis: Holding Eliquis   Code Status: DNR Family Communication: Daughter over the phone Disposition Plan:  Status is: Inpatient Remains inpatient appropriate because: Monitor blood pressure   Antimicrobials:  Anti-infectives (From admission, onward)    None        Objective: Vitals:   09/12/22 0511 09/12/22 0525 09/12/22 0610 09/12/22 0634  BP: (!) 79/49 (!) 82/58 (!) 129/48 126/79  Pulse:  65    Resp:  16    Temp:  97.7 F (36.5 C)    TempSrc:  Oral    SpO2:      Weight:      Height:        Intake/Output Summary (Last 24 hours) at 09/12/2022 1034 Last data filed at 09/12/2022 0800 Gross per 24 hour  Intake 2087.52 ml  Output --  Net 2087.52 ml   Filed Weights   09/10/22 1745 09/11/22 0208  Weight: 49.4 kg 45.5 kg    Examination:  General exam: Appears calm and comfortable  Respiratory system: Clear to auscultation. Respiratory effort normal. No respiratory distress. No conversational dyspnea.  Cardiovascular system: S1 & S2 heard, RRR (PACs  on tele with NSR). No murmurs. No pedal edema. Gastrointestinal system: Abdomen is nondistended, soft and nontender. Normal bowel sounds heard. Central nervous system: Alert and oriented. No focal neurological deficits. Speech clear.  Extremities: Symmetric in appearance  Skin: No rashes, lesions or ulcers on exposed skin  Psychiatry: Judgement and insight appear normal. Mood & affect  appropriate.   Data Reviewed: I have personally reviewed following labs and imaging studies  CBC: Recent Labs  Lab 09/10/22 1803 09/11/22 1032 09/12/22 0156 09/12/22 0405  WBC 17.3* 13.5* 9.7 12.7*  NEUTROABS 11.7*  --   --   --   HGB 13.2 11.5* 8.0* 8.8*  HCT 39.1 35.7* 24.0* 27.6*  MCV 100.0 103.5* 101.7* 103.8*  PLT 218 201 151 [id number]   Basic Metabolic Panel: Recent Labs  Lab 09/10/22 1803 09/11/22 1032 09/12/22 0156 09/12/22 0405  NA 131* 134* 132* 134*  K 4.5 4.4 3.9 3.9  CL 93* 102 106 105  CO2 27 24 21* 21*  GLUCOSE 97 116* 139* 133*  BUN 41* 24* 20 18  CREATININE 1.06* 0.87 0.90 0.87  CALCIUM 9.0 8.0* 6.8* 7.1*  MG 2.2  --   --   --    GFR: Estimated Creatinine Clearance: 29.3 mL/Sydney (by C-G formula based on SCr of 0.87 mg/dL). Liver Function Tests: Recent Labs  Lab 09/10/22 1803 09/12/22 0156  AST 18 15  ALT 19 13  ALKPHOS 56 33*  BILITOT 0.9 0.4  PROT 6.6 3.5*  ALBUMIN 4.0 1.9*   Recent Labs  Lab 09/10/22 1803  LIPASE 58*   No results for input(s): "AMMONIA" in the last 168 hours. Coagulation Profile: No results for input(s): "INR", "PROTIME" in the last 168 hours. Cardiac Enzymes: No results for input(s): "CKTOTAL", "CKMB", "CKMBINDEX", "TROPONINI" in the last 168 hours. BNP (last 3 results) No results for input(s): "PROBNP" in the last 8760 hours. HbA1C: No results for input(s): "HGBA1C" in the last 72 hours. CBG: No results for input(s): "GLUCAP" in the last 168 hours. Lipid Profile: No results for input(s): "CHOL", "HDL", "LDLCALC", "TRIG", "CHOLHDL", "LDLDIRECT" in the last 72 hours. Thyroid Function Tests: No results for input(s): "TSH", "T4TOTAL", "FREET4", "T3FREE", "THYROIDAB" in the last 72 hours. Anemia Panel: No results for input(s): "VITAMINB12", "FOLATE", "FERRITIN", "TIBC", "IRON", "RETICCTPCT" in the last 72 hours. Sepsis Labs: No results for input(s): "PROCALCITON", "LATICACIDVEN" in the last 168 hours.  Recent Results  (from the past 240 hour(s))  Resp Panel by RT-PCR (Flu A&B, Covid) Anterior Nasal Swab     Status: None   Collection Time: 09/10/22  6:03 PM   Specimen: Anterior Nasal Swab  Result Value Ref Range Status   SARS Coronavirus 2 by RT PCR NEGATIVE NEGATIVE Final    Comment: (NOTE) SARS-CoV-2 target nucleic acids are NOT DETECTED.  The SARS-CoV-2 RNA is generally detectable in upper respiratory specimens during the acute phase of infection. The lowest concentration of SARS-CoV-2 viral copies this assay can detect is 138 copies/mL. A negative result does not preclude SARS-Cov-2 infection and should not be used as the sole basis for treatment or other patient management decisions. A negative result may occur with  improper specimen collection/handling, submission of specimen other than nasopharyngeal swab, presence of viral mutation(s) within the areas targeted by this assay, and inadequate number of viral copies(<138 copies/mL). A negative result must be combined with clinical observations, patient history, and epidemiological information. The expected result is Negative.  Fact Sheet for Patients:  EntrepreneurPulse.com.au  Fact Sheet for Healthcare Providers:  IncredibleEmployment.be  This test is no t yet approved or cleared by the [place] and  has been authorized for detection and/or diagnosis of SARS-CoV-2 by FDA under an Emergency Use Authorization (EUA). This EUA will remain  in effect (meaning this test can be used) for the duration of the COVID-19 declaration under Section 564(b)(1) of the Act, 21 U.S.C.section 360bbb-3(b)(1), unless the authorization is terminated  or revoked sooner.       Influenza A by PCR NEGATIVE NEGATIVE Final   Influenza B by PCR NEGATIVE NEGATIVE Final    Comment: (NOTE) The Xpert Xpress SARS-CoV-2/FLU/RSV plus assay is intended as an aid in the diagnosis of influenza from Nasopharyngeal swab specimens  and should not be used as a sole basis for treatment. Nasal washings and aspirates are unacceptable for Xpert Xpress SARS-CoV-2/FLU/RSV testing.  Fact Sheet for Patients: EntrepreneurPulse.com.au  Fact Sheet for Healthcare Providers: IncredibleEmployment.be  This test is not yet approved or cleared by the [place] FDA and has been authorized for detection and/or diagnosis of SARS-CoV-2 by FDA under an Emergency Use Authorization (EUA). This EUA will remain in effect (meaning this test can be used) for the duration of the COVID-19 declaration under Section 564(b)(1) of the Act, 21 U.S.C. section 360bbb-3(b)(1), unless the authorization is terminated or revoked.  Performed at [facility], [address] [place], [place] [address]       Radiology Studies: ECHOCARDIOGRAM COMPLETE  Result Date: 09/11/2022    ECHOCARDIOGRAM REPORT   Patient Name:   Sydney Gross Date of Exam: 09/11/2022 Medical Rec #:  [id number]         Height:       59.0 in Accession #:    [id number]        Weight:       100.3 lb Date of Birth:  [birth year]         BSA:          1.376 m Patient Age:    67 years          BP:           132/72 mmHg Patient Gender: F                 HR:           88 bpm. Exam Location:  Inpatient Procedure: 2D Echo, Cardiac Doppler and Color Doppler Indications:    Elevated Troponin  History:        Patient has prior history of Echocardiogram examinations, most                 recent 06/17/2022. CAD, COPD and Pulmonary HTN, Arrythmias:Atrial                 Fibrillation; Risk Factors:Dyslipidemia and Hypertension.  Sonographer:    Bernadene Person RDCS Referring Phys: [id number] [place]  1. Left ventricular ejection fraction, by estimation, is 65 to 70%. The left ventricle has normal function. The left ventricle has no regional wall motion abnormalities. There is mild left ventricular hypertrophy of the basal-septal  segment. Left ventricular diastolic function could not be evaluated.  2. Right ventricular systolic function is normal. The right ventricular size is normal. There is mildly elevated pulmonary artery systolic pressure. The estimated right ventricular systolic pressure is [id number] mmHg.  3. Left atrial size was moderately dilated.  4. The mitral valve is normal in structure. Mild mitral valve regurgitation. No evidence of mitral stenosis.  5. Tricuspid  valve regurgitation is mild to moderate.  6. The aortic valve is tricuspid. There is moderate calcification of the aortic valve. There is moderate thickening of the aortic valve. Aortic valve regurgitation is mild. Mild aortic valve stenosis. Aortic regurgitation PHT measures 584 msec. Aortic valve area, by VTI measures 1.39 cm. Aortic valve mean gradient measures 16.0 mmHg. Aortic valve Vmax measures 2.87 m/s.  7. The inferior vena cava is normal in size with greater than 50% respiratory variability, suggesting right atrial pressure of 3 mmHg.  8. A small pericardial effusion is present. The pericardial effusion is anterior to the right ventricle. There is no evidence of cardiac tamponade. FINDINGS  Left Ventricle: Left ventricular ejection fraction, by estimation, is 65 to 70%. The left ventricle has normal function. The left ventricle has no regional wall motion abnormalities. The left ventricular internal cavity size was normal in size. There is  mild left ventricular hypertrophy of the basal-septal segment. Left ventricular diastolic function could not be evaluated. Right Ventricle: The right ventricular size is normal. No increase in right ventricular wall thickness. Right ventricular systolic function is normal. There is mildly elevated pulmonary artery systolic pressure. The tricuspid regurgitant velocity is 2.96  m/s, and with an assumed right atrial pressure of 3 mmHg, the estimated right ventricular systolic pressure is [id number] mmHg. Left Atrium: Left atrial size  was moderately dilated. Right Atrium: Right atrial size was normal in size. Pericardium: A small pericardial effusion is present. The pericardial effusion is anterior to the right ventricle. There is no evidence of cardiac tamponade. Mitral Valve: The mitral valve is normal in structure. Mild mitral valve regurgitation. No evidence of mitral valve stenosis. Tricuspid Valve: The tricuspid valve is normal in structure. Tricuspid valve regurgitation is mild to moderate. No evidence of tricuspid stenosis. Aortic Valve: The aortic valve is tricuspid. There is moderate calcification of the aortic valve. There is moderate thickening of the aortic valve. Aortic valve regurgitation is mild. Aortic regurgitation PHT measures 584 msec. Mild aortic stenosis is present. Aortic valve mean gradient measures 16.0 mmHg. Aortic valve peak gradient measures 32.9 mmHg. Aortic valve area, by VTI measures 1.39 cm. Pulmonic Valve: The pulmonic valve was normal in structure. Pulmonic valve regurgitation is mild. No evidence of pulmonic stenosis. Aorta: The aortic root is normal in size and structure. Venous: The inferior vena cava is normal in size with greater than 50% respiratory variability, suggesting right atrial pressure of 3 mmHg. IAS/Shunts: No atrial level shunt detected by color flow Doppler.  LEFT VENTRICLE PLAX 2D LVIDd:         3.10 cm LVIDs:         1.60 cm LV PW:         1.10 cm LV IVS:        1.20 cm LVOT diam:     1.80 cm LV SV:         69 LV SV Index:   50 LVOT Area:     2.54 cm  LV Volumes (MOD) LV vol d, MOD A2C: 36.5 ml LV vol d, MOD A4C: 43.5 ml LV vol s, MOD A2C: 14.1 ml LV vol s, MOD A4C: 12.2 ml LV SV MOD A2C:     22.4 ml LV SV MOD A4C:     43.5 ml LV SV MOD BP:      26.1 ml RIGHT VENTRICLE TAPSE (M-mode): 1.4 cm LEFT ATRIUM             Index  RIGHT ATRIUM          Index LA diam:        4.20 cm 3.05 cm/m   RA Area:     8.96 cm LA Vol (A2C):   54.7 ml 39.77 ml/m  RA Volume:   14.50 ml 10.54 ml/m LA Vol  (A4C):   60.6 ml 44.06 ml/m LA Biplane Vol: 61.5 ml 44.71 ml/m  AORTIC VALVE AV Area (Vmax):    1.57 cm AV Area (Vmean):   1.61 cm AV Area (VTI):     1.39 cm AV Vmax:           287.00 cm/s AV Vmean:          183.000 cm/s AV VTI:            0.493 m AV Peak Grad:      32.9 mmHg AV Mean Grad:      16.0 mmHg LVOT Vmax:         177.00 cm/s LVOT Vmean:        115.500 cm/s LVOT VTI:          0.270 m LVOT/AV VTI ratio: 0.55 AI PHT:            584 msec  AORTA Ao Root diam: 2.80 cm Ao Asc diam:  2.80 cm TRICUSPID VALVE TR Peak grad:   35.0 mmHg TR Vmax:        296.00 cm/s  SHUNTS Systemic VTI:  0.27 m Systemic Diam: 1.80 cm Fransico Him MD Electronically signed by Fransico Him MD Signature Date/Time: 09/11/2022/10:52:13 AM    Final    CT ANGIO HEAD NECK W WO CM  Result Date: 09/10/2022 CLINICAL DATA:  Initial evaluation for acute dizziness. EXAM: CT ANGIOGRAPHY HEAD AND NECK TECHNIQUE: Multidetector CT imaging of the head and neck was performed using the standard protocol during bolus administration of intravenous contrast. Multiplanar CT image reconstructions and MIPs were obtained to evaluate the vascular anatomy. Carotid stenosis measurements (when applicable) are obtained utilizing NASCET criteria, using the distal internal carotid diameter as the denominator. RADIATION DOSE REDUCTION: This exam was performed according to the departmental dose-optimization program which includes automated exposure control, adjustment of the mA and/or kV according to patient size and/or use of iterative reconstruction technique. CONTRAST:  32mL OMNIPAQUE IOHEXOL 350 MG/ML SOLN COMPARISON:  Prior study from 06/23/2012. FINDINGS: CT HEAD FINDINGS Brain: Age-related cerebral atrophy with chronic small vessel ischemic disease. No acute intracranial hemorrhage. No acute large vessel territory infarct. 1.5 cm calcified meningioma overlies anterior left frontal convexity without edema or mass effect. No midline shift or hydrocephalus. No  extra-axial fluid collection. Vascular: No hyperdense vessel. Scattered vascular calcifications noted within the carotid siphons. Skull: Scalp soft tissues and calvarium within normal limits. Sinuses/Orbits: Globes orbital soft tissues demonstrate no acute finding. Visualized paranasal sinuses and mastoid air cells are clear. Other: None. Review of the MIP images confirms the above findings CTA NECK FINDINGS Aortic arch: Visualized aortic arch normal caliber with standard branch pattern. Moderate atheromatous change about the arch itself. No high-grade stenosis about the origin the great vessels. Right carotid system: Right common and internal carotid arteries are tortuous without dissection or occlusion. No hemodynamically significant stenosis about the right carotid artery system within the neck. Left carotid system: She left common and internal carotid arteries are tortuous but patent without dissection or occlusion. No hemodynamically significant stenosis about the left carotid artery system within the neck. Vertebral arteries: Both vertebral arteries arise from subclavian arteries.  No proximal subclavian artery stenosis. Vertebral arteries are tortuous but widely patent without dissection or stenosis. Skeleton: No discrete or worrisome osseous lesions. Moderate cervical spondylosis with grade 1 anterolisthesis of C2 on C3. Other neck: No other acute soft tissue abnormality within the neck. Upper chest: Visualized upper chest demonstrates no acute finding. Review of the MIP images confirms the above findings CTA HEAD FINDINGS Anterior circulation: Petrous segments patent bilaterally. Mild for age atheromatous change within the carotid siphons without stenosis. Left A1 patent. Right A1 hypoplastic but grossly patent as well, accounting for the diminutive right ICA is compared to the left. Normal anterior communicating artery complex. Tortuosity and atheromatous irregularity throughout the ACAs without proximal  high-grade stenosis. No high-grade M1 stenosis or occlusion. No proximal MCA branch occlusion. Diffuse MCA branches well perfused bilaterally, although demonstrate diffuse atheromatous irregularity. Posterior circulation: Intradural V4 segments are tortuous but patent without stenosis. Both PICA patent. Atheromatous irregularity within the basilar artery with associated mild to moderate stenoses. Superior cerebral arteries patent bilaterally. Both PCAs primarily supplied via the basilar. Atheromatous irregularity throughout both PCAs with associated moderate to severe left P2 stenoses. Both PCAs remain patent to their distal aspects. Venous sinuses: Patent allowing for timing the contrast bolus. Anatomic variants: None significant.  No aneurysm. Review of the MIP images confirms the above findings IMPRESSION: CT HEAD: 1. No acute intracranial abnormality. 2. Age-related cerebral atrophy with chronic small vessel ischemic disease. 3. 1.5 cm calcified meningioma overlying the anterior left frontal convexity without edema or mass effect. CTA HEAD AND NECK: 1. Negative CTA for large vessel occlusion or other emergent finding. 2. Moderate intracranial atherosclerotic disease, with most notable findings including moderate to severe left P2 stenoses. No other proximal high-grade or correctable stenosis. 3. Diffuse tortuosity about the major arterial vasculature of the head and neck, suggesting chronic underlying hypertension. Aortic Atherosclerosis (ICD10-I70.0). Electronically Signed   By: Jeannine Boga M.D.   On: 09/10/2022 20:23   CT ABDOMEN PELVIS W CONTRAST  Result Date: 09/10/2022 CLINICAL DATA:  Abdominal pain, nausea, diarrhea, dizziness EXAM: CT ABDOMEN AND PELVIS WITH CONTRAST TECHNIQUE: Multidetector CT imaging of the abdomen and pelvis was performed using the standard protocol following bolus administration of intravenous contrast. RADIATION DOSE REDUCTION: This exam was performed according to the  departmental dose-optimization program which includes automated exposure control, adjustment of the mA and/or kV according to patient size and/or use of iterative reconstruction technique. CONTRAST:  34mL OMNIPAQUE IOHEXOL 350 MG/ML SOLN COMPARISON:  06/17/2022 FINDINGS: Lower chest: Lung bases are clear. Hepatobiliary: Liver is within normal limits, noting focal fat/altered perfusion along the falciform ligament. Status post cholecystectomy. No intrahepatic or extrahepatic ductal dilatation. Pancreas: Within normal limits. Spleen: Within normal limits. Adrenals/Urinary Tract: Adrenal glands are within normal limits. 4.6 cm right lower pole renal cyst (series 3/image 24), measuring simple fluid density, benign (Bosniak I). No follow-up is recommended. 7 mm fat density lesion in the posterior left lower kidney (series 3/image 16), reflecting a benign renal angiomyolipoma. No follow-up is recommended. Mild left renal atrophy. No hydronephrosis. Bladder is within normal limits. Stomach/Bowel: Stomach is within normal limits. No evidence of bowel obstruction. Appendix is not discretely visualized, likely surgically absent. Sigmoid diverticulosis, without evidence of diverticulitis. Vascular/Lymphatic: No evidence of abdominal aortic aneurysm. 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 Left common iliac stent. Atherosclerotic calcifications of the abdominal aorta and branch vessels. No suspicious abdominopelvic lymphadenopathy. Reproductive: Status post hysterectomy. No adnexal masses. Other: No abdominopelvic ascites. Musculoskeletal:  Degenerative changes of the visualized thoracolumbar spine. Upper lumbar dextroscoliosis. IMPRESSION: Sigmoid diverticulosis, without evidence of diverticulitis. 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 Left common iliac stent. No CT findings to account for the patient's abdominal pain. Electronically Signed   By: Julian Hy M.D.   On:  09/10/2022 20:01   DG Chest Portable 1 View  Result Date: 09/10/2022 CLINICAL DATA:  Addendum abdominal pain. EXAM: PORTABLE CHEST 1 VIEW COMPARISON:  Chest x-ray 08/24/2022 FINDINGS: The heart size and mediastinal contours are within normal limits. Both lungs are clear. There is levoconvex curvature of the thoracic spine. IMPRESSION: No active disease. Electronically Signed   By: Ronney Asters M.D.   On: 09/10/2022 18:49      Scheduled Meds:  fluticasone furoate-vilanterol  1 puff Inhalation Daily   And   umeclidinium bromide  1 puff Inhalation Daily   influenza vaccine adjuvanted  0.5 mL Intramuscular Tomorrow-1000   magnesium chloride  1 tablet Oral BID   midodrine  10 mg Oral TID WC   pantoprazole  40 mg Oral Daily   predniSONE  20 mg Oral Q breakfast   Continuous Infusions:  sodium chloride 75 mL/hr at 09/12/22 0919     LOS: 1 day     Dessa Phi, DO [facility] 09/12/2022, 10:34 AM   Available via Epic secure chat 7am-7pm After these hours, please refer to coverage provider listed on [email or web address]

## 2022-09-13 DIAGNOSIS — R197 Diarrhea, unspecified: Secondary | ICD-10-CM | POA: Diagnosis not present

## 2022-09-13 DIAGNOSIS — R112 Nausea with vomiting, unspecified: Secondary | ICD-10-CM | POA: Diagnosis not present

## 2022-09-13 LAB — CBC
HCT: 27 % — ABNORMAL LOW (ref 36.0–46.0)
Hemoglobin: 8.9 g/dL — ABNORMAL LOW (ref 12.0–15.0)
MCH: 33.6 pg (ref 26.0–34.0)
MCHC: 33 g/dL (ref 30.0–36.0)
MCV: 101.9 fL — ABNORMAL HIGH (ref 80.0–100.0)
Platelets: 179 10*3/uL (ref 150–400)
RBC: 2.65 MIL/uL — ABNORMAL LOW (ref 3.87–5.11)
RDW: 13.6 % (ref 11.5–15.5)
WBC: 10.5 10*3/uL (ref 4.0–10.5)
nRBC: 0 % (ref 0.0–0.2)

## 2022-09-13 MED ORDER — TRAZODONE HCL 50 MG PO TABS: 50.0000 mg | ORAL_TABLET | Freq: Every evening | ORAL | Status: DC | PRN

## 2022-09-13 NOTE — H&P (View-Only) (Signed)
Subjective: Sydney Gross is a 88 year old white female with multiple medical problems including peripheral arterial disease, CAD, COPD, chronic diastolic CHF, PAF on Eliquis, stage IIIa kidney disease who was hospitalized with some dizziness weakness nausea and vomiting or abdominal pain.  She claims she is feeling better today but had some melenic stools with hypotension today causing concern for GI bleed. She was FOBT positive at the time as per my discussion with the patient's nurse her tizanidine is causing her to be hypotensive and therefore that has been held for now. She had some abdominal pain for about 10 days a couple of weeks ago but that has completely resolved now.  Patient claims she is taking Pepto-Bismol occasionally but has noticed some melenic stools off and on even when she did not take the Pepto-Bismol. Patient has a history of peptic ulcer disease in the past is hesitant undergo another endoscopy as she is DNR. Her daughter is at her bedside.  Her Eliquis has been on hold.  Daughter has several questions about resuming anticoagulation. Her last colonoscopy done on 12/30/2100 revealed sigmoid diverticulosis and internal hemorrhoids; a few hyperplastic polyps were removed.    Objective: Vital signs in last 24 hours: Temp:  [97.7 F (36.5 C)-98.1 F (36.7 C)] 98.1 F (36.7 C) (10/09 0422) Pulse Rate:  [71-83] 83 (10/09 0422) Resp:  [16-18] 16 (10/09 0422) BP: (90-140)/(64-84) 140/64 (10/09 0422) SpO2:  [100 %] 100 % (10/09 0811) Last BM Date : 09/12/22  Intake/Output from previous day: 10/08 0701 - 10/09 0700 In: 2978.2 [P.O.:240; I.V.:1755.8; IV Piggyback:982.4] Out: 100 [Urine:100] Intake/Output this shift: No intake/output data recorded.  General appearance: alert, cooperative, appears stated age, and fatigued Resp: clear to auscultation bilaterally Cardio: regular rate and rhythm, S1, S2 normal, no murmur, click, rub or gallop GI: soft, non-tender; bowel sounds  normal; no masses,  no organomegaly  Lab Results: Recent Labs    09/12/22 1606 09/12/22 2110 09/13/22 0426  WBC 13.2* 12.4* 10.5  HGB 9.9* 9.8* 8.9*  HCT 29.9* 30.0* 27.0*  PLT 205 181 179   BMET Recent Labs    09/11/22 1032 09/12/22 0156 09/12/22 0405  NA 134* 132* 134*  K 4.4 3.9 3.9  CL 102 106 105  CO2 24 21* 21*  GLUCOSE 116* 139* 133*  BUN 24* 20 18  CREATININE 0.87 0.90 0.87  CALCIUM 8.0* 6.8* 7.1*   LFT Recent Labs    09/12/22 0156  PROT 3.5*  ALBUMIN 1.9*  AST 15  ALT 13  ALKPHOS 33*  BILITOT 0.4   Studies/Results: No results found.  Medications: I have reviewed the patient's current medications. Prior to Admission:  Medications Prior to Admission  Medication Sig Dispense Refill Last Dose   albuterol (PROVENTIL) (2.5 MG/3ML) 0.083% nebulizer solution Take 3 mLs (2.5 mg total) by nebulization every 2 (two) hours as needed for wheezing or shortness of breath. 75 mL 12 09/10/2022   albuterol (VENTOLIN HFA) 108 (90 Base) MCG/ACT inhaler Inhale 1 puff into the lungs every 6 (six) hours as needed for shortness of breath.   Past Week   apixaban (ELIQUIS) 2.5 MG TABS tablet Take 1 tablet (2.5 mg total) by mouth 2 (two) times daily. 60 tablet 11 09/09/2022 at 0800   Budeson-Glycopyrrol-Formoterol (BREZTRI AEROSPHERE) 160-9-4.8 MCG/ACT AERO Inhale 2 puffs into the lungs 2 (two) times daily as needed (shortness of breath/ COPD).   Past Week   denosumab (PROLIA) 60 MG/ML SOSY injection Inject 60 mg as directed every 6 (six) months.  unknown   diphenhydramine-acetaminophen (TYLENOL PM) 25-500 MG TABS tablet Take 1 tablet by mouth at bedtime.   09/09/2022   [EXPIRED] doxycycline (VIBRA-TABS) 100 MG tablet Take 1 tablet (100 mg total) by mouth every 12 (twelve) hours for 14 days. 28 tablet 0 09/09/2022   Evolocumab (REPATHA SURECLICK) 664 MG/ML SOAJ INJECT 1 PEN INTO THE SKIN EVERY 14 DAYS (Patient taking differently: Inject 140 mg into the skin every 14 (fourteen) days.)  2 mL 11 09/10/2022   ezetimibe (ZETIA) 10 MG tablet Take 1 tablet by mouth once daily (Patient taking differently: Take 10 mg by mouth daily.) 90 tablet 2 09/09/2022   ipratropium-albuterol (DUONEB) 0.5-2.5 (3) MG/3ML SOLN Take 3 mLs by nebulization 3 (three) times daily. 360 mL 0 09/10/2022   lisinopril-hydrochlorothiazide (ZESTORETIC) 20-25 MG tablet Take 0.5 tablets by mouth in the morning. (Patient taking differently: Take 0.5 tablets by mouth daily.)   09/09/2022   Magnesium Cl-Calcium Carbonate (SLOW-MAG PO) Take 1 tablet by mouth 2 (two) times daily.   09/09/2022   metoprolol succinate (TOPROL-XL) 25 MG 24 hr tablet Take 1 tablet (25 mg total) by mouth daily. Pt needs to make appt to see provider for further refills - 2nd attempt (Patient taking differently: Take 25 mg by mouth daily.) 90 tablet 3    metoprolol tartrate (LOPRESSOR) 25 MG tablet Take one half - one tablet (12.5 mg - 25 mg) as needed for Heart Rate 100 or above. (Patient taking differently: Take 12.5 mg by mouth daily as needed (for heart rate >= 100).) 30 tablet 3 unknown at 0800   nitroGLYCERIN (NITROSTAT) 0.4 MG SL tablet Place 0.4 mg under the tongue every 5 (five) minutes as needed for chest pain.   unknown   predniSONE (DELTASONE) 10 MG tablet Tak 4 tabs daily for 5 days, then take 3 tabs daily for 5 days, then take 2 tabs daily for 5 days, then take 1 tab daily for 5 days, then STOP. (Patient taking differently: Take 10-40 mg by mouth See admin instructions. Take 40 mg daily for 5 days, then take 30 mg by mouth for 5 days, then take 20 mg by mouth for 5 days, then take 10 mg by mouth for 5 days per patient) 50 tablet 0 09/09/2022   tiZANidine (ZANAFLEX) 4 MG capsule Take 4 mg by mouth daily as needed for muscle spasms.   Past Week   Scheduled:  fluticasone furoate-vilanterol  1 puff Inhalation Daily   And   umeclidinium bromide  1 puff Inhalation Daily   influenza vaccine adjuvanted  0.5 mL Intramuscular Tomorrow-1000    magnesium chloride  1 tablet Oral BID   pantoprazole  40 mg Oral Daily   predniSONE  20 mg Oral Q breakfast   Continuous:  Assessment/Plan: 1) Melena with anemia-EGD planned for tomorrow to rule out recurrent peptic ulcer disease.  2) Chronic diastolic congestive heart failure/CAD/PAD/PAF. 3) COPD. 4) Chronic kidney disease Stage IIIa. 5) Elevated troponins 2D echo done-patient's daughters would like to have a discussion with the cardiology service with regards to her anticoagulation.  I have asked to discuss this with her cardiologist after her EGD has been done. 7) Sigmoid diverticulosis.   LOS: 2 days   Juanita Craver 09/13/2022, 12:32 PM

## 2022-09-13 NOTE — Progress Notes (Addendum)
Subjective: Sydney Gross is a 88 year old white female with multiple medical problems including peripheral arterial disease, CAD, COPD, chronic diastolic CHF, PAF on Eliquis, stage IIIa kidney disease who was hospitalized with some dizziness weakness nausea and vomiting or abdominal pain.  She claims she is feeling better today but had some melenic stools with hypotension today causing concern for GI bleed. She was FOBT positive at the time as per my discussion with the patient's nurse her tizanidine is causing her to be hypotensive and therefore that has been held for now. She had some abdominal pain for about 10 days a couple of weeks ago but that has completely resolved now.  Patient claims she is taking Pepto-Bismol occasionally but has noticed some melenic stools off and on even when she did not take the Pepto-Bismol. Patient has a history of peptic ulcer disease in the past is hesitant undergo another endoscopy as she is DNR. Her daughter is at her bedside.  Her Eliquis has been on hold.  Daughter has several questions about resuming anticoagulation. Her last colonoscopy done on 12/30/2100 revealed sigmoid diverticulosis and internal hemorrhoids; a few hyperplastic polyps were removed.    Objective: Vital signs in last 24 hours: Temp:  [97.7 F (36.5 C)-98.1 F (36.7 C)] 98.1 F (36.7 C) (10/09 0422) Pulse Rate:  [71-83] 83 (10/09 0422) Resp:  [16-18] 16 (10/09 0422) BP: (90-140)/(64-84) 140/64 (10/09 0422) SpO2:  [100 %] 100 % (10/09 0811) Last BM Date : 09/12/22  Intake/Output from previous day: 10/08 0701 - 10/09 0700 In: 2978.2 [P.O.:240; I.V.:1755.8; IV Piggyback:982.4] Out: 100 [Urine:100] Intake/Output this shift: No intake/output data recorded.  General appearance: alert, cooperative, appears stated age, and fatigued Resp: clear to auscultation bilaterally Cardio: regular rate and rhythm, S1, S2 normal, no murmur, click, rub or gallop GI: soft, non-tender; bowel sounds  normal; no masses,  no organomegaly  Lab Results: Recent Labs    09/12/22 1606 09/12/22 2110 09/13/22 0426  WBC 13.2* 12.4* 10.5  HGB 9.9* 9.8* 8.9*  HCT 29.9* 30.0* 27.0*  PLT 205 181 179   BMET Recent Labs    09/11/22 1032 09/12/22 0156 09/12/22 0405  NA 134* 132* 134*  K 4.4 3.9 3.9  CL 102 106 105  CO2 24 21* 21*  GLUCOSE 116* 139* 133*  BUN 24* 20 18  CREATININE 0.87 0.90 0.87  CALCIUM 8.0* 6.8* 7.1*   LFT Recent Labs    09/12/22 0156  PROT 3.5*  ALBUMIN 1.9*  AST 15  ALT 13  ALKPHOS 33*  BILITOT 0.4   Studies/Results: No results found.  Medications: I have reviewed the patient's current medications. Prior to Admission:  Medications Prior to Admission  Medication Sig Dispense Refill Last Dose   albuterol (PROVENTIL) (2.5 MG/3ML) 0.083% nebulizer solution Take 3 mLs (2.5 mg total) by nebulization every 2 (two) hours as needed for wheezing or shortness of breath. 75 mL 12 09/10/2022   albuterol (VENTOLIN HFA) 108 (90 Base) MCG/ACT inhaler Inhale 1 puff into the lungs every 6 (six) hours as needed for shortness of breath.   Past Week   apixaban (ELIQUIS) 2.5 MG TABS tablet Take 1 tablet (2.5 mg total) by mouth 2 (two) times daily. 60 tablet 11 09/09/2022 at 0800   Budeson-Glycopyrrol-Formoterol (BREZTRI AEROSPHERE) 160-9-4.8 MCG/ACT AERO Inhale 2 puffs into the lungs 2 (two) times daily as needed (shortness of breath/ COPD).   Past Week   denosumab (PROLIA) 60 MG/ML SOSY injection Inject 60 mg as directed every 6 (six) months.  unknown   diphenhydramine-acetaminophen (TYLENOL PM) 25-500 MG TABS tablet Take 1 tablet by mouth at bedtime.   09/09/2022   [EXPIRED] doxycycline (VIBRA-TABS) 100 MG tablet Take 1 tablet (100 mg total) by mouth every 12 (twelve) hours for 14 days. 28 tablet 0 09/09/2022   Evolocumab (REPATHA SURECLICK) 664 MG/ML SOAJ INJECT 1 PEN INTO THE SKIN EVERY 14 DAYS (Patient taking differently: Inject 140 mg into the skin every 14 (fourteen) days.)  2 mL 11 09/10/2022   ezetimibe (ZETIA) 10 MG tablet Take 1 tablet by mouth once daily (Patient taking differently: Take 10 mg by mouth daily.) 90 tablet 2 09/09/2022   ipratropium-albuterol (DUONEB) 0.5-2.5 (3) MG/3ML SOLN Take 3 mLs by nebulization 3 (three) times daily. 360 mL 0 09/10/2022   lisinopril-hydrochlorothiazide (ZESTORETIC) 20-25 MG tablet Take 0.5 tablets by mouth in the morning. (Patient taking differently: Take 0.5 tablets by mouth daily.)   09/09/2022   Magnesium Cl-Calcium Carbonate (SLOW-MAG PO) Take 1 tablet by mouth 2 (two) times daily.   09/09/2022   metoprolol succinate (TOPROL-XL) 25 MG 24 hr tablet Take 1 tablet (25 mg total) by mouth daily. Pt needs to make appt to see provider for further refills - 2nd attempt (Patient taking differently: Take 25 mg by mouth daily.) 90 tablet 3    metoprolol tartrate (LOPRESSOR) 25 MG tablet Take one half - one tablet (12.5 mg - 25 mg) as needed for Heart Rate 100 or above. (Patient taking differently: Take 12.5 mg by mouth daily as needed (for heart rate >= 100).) 30 tablet 3 unknown at 0800   nitroGLYCERIN (NITROSTAT) 0.4 MG SL tablet Place 0.4 mg under the tongue every 5 (five) minutes as needed for chest pain.   unknown   predniSONE (DELTASONE) 10 MG tablet Tak 4 tabs daily for 5 days, then take 3 tabs daily for 5 days, then take 2 tabs daily for 5 days, then take 1 tab daily for 5 days, then STOP. (Patient taking differently: Take 10-40 mg by mouth See admin instructions. Take 40 mg daily for 5 days, then take 30 mg by mouth for 5 days, then take 20 mg by mouth for 5 days, then take 10 mg by mouth for 5 days per patient) 50 tablet 0 09/09/2022   tiZANidine (ZANAFLEX) 4 MG capsule Take 4 mg by mouth daily as needed for muscle spasms.   Past Week   Scheduled:  fluticasone furoate-vilanterol  1 puff Inhalation Daily   And   umeclidinium bromide  1 puff Inhalation Daily   influenza vaccine adjuvanted  0.5 mL Intramuscular Tomorrow-1000    magnesium chloride  1 tablet Oral BID   pantoprazole  40 mg Oral Daily   predniSONE  20 mg Oral Q breakfast   Continuous:  Assessment/Plan: 1) Melena with anemia-EGD planned for tomorrow to rule out recurrent peptic ulcer disease.  2) Chronic diastolic congestive heart failure/CAD/PAD/PAF. 3) COPD. 4) Chronic kidney disease Stage IIIa. 5) Elevated troponins 2D echo done-patient's daughters would like to have a discussion with the cardiology service with regards to her anticoagulation.  I have asked to discuss this with her cardiologist after her EGD has been done. 7) Sigmoid diverticulosis.   LOS: 2 days   Juanita Craver 09/13/2022, 12:32 PM

## 2022-09-13 NOTE — Progress Notes (Signed)
PROGRESS NOTE    [name]  PPJ:[id number] DOB: 01-09-1930 DOA: 09/10/2022 PCP: Mayra Neer, MD     Brief Narrative:  Sydney Gross is a very pleasant 88 y.o. female with medical history significant for COPD, CAD, PAD, chronic diastolic CHF, PAF on Eliquis, CKD 3A, hypertension, and recent admission for COPD exacerbation who presents to the emergency department with abdominal pain, nausea, vomiting, diarrhea, and dizziness. Patient reports 1 week of intermittent upper abdominal pain with nausea, nonbloody vomiting, and some loose stools.  She was also experiencing a room spinning sensation.  She had a mild ache in her chest on 09/06/2022 but no chest discomfort since.  CT of the abdomen and pelvis is negative for acute findings.  There is no acute intracranial abnormality on head CT and CTA of the head and neck is negative for large vessel occlusion. ED physician discussed case with cardiology (Dr. Myles Gip) and it was recommended that patient be observed on heart monitor overnight and echo repeated in the morning.  Cardiology advised against starting heparin.   Overnight 10/8, patient had episode of hypotension requiring IV fluid and midodrine.  There was concern for GI bleeding.  GI was consulted.  New events last 24 hours / Subjective: Patient had melanotic stool yesterday.  But otherwise feeling well today.  Tolerated breakfast, no abdominal pain or nausea.  No dizziness, blood pressure has improved.  Assessment & Plan:   Principal Problem:   Nausea, vomiting and diarrhea Active Problems:   Stage 3a chronic kidney disease (CKD) (HCC)   COPD (chronic obstructive pulmonary disease) (HCC)   Chronic diastolic CHF (congestive heart failure) (HCC)   Abdominal pain   Paroxysmal atrial fibrillation (HCC)   Elevated troponin   Hyponatremia   Nausea, vomiting, diarrhea, abdominal pain -Could be secondary to use of doxycycline, prednisone, peptic ulcer  disease -Improved -PPI  Hypotension -Question possible GI bleeding.   -Blood pressure improved.  Continue to hold HCTZ, lisinopril, metoprolol  Melena -Previous history of nonbleeding gastric ulcer in 2022 -Baseline Hgb 11-13  -Hold Eliquis -FOBT positive -Hemoglobin stable this morning -GI following  Demand ischemia -Troponin has trended downward 155>135>97>87  -Echocardiogram EF 65 to 70%, no wall motion abnormality -No chest pain   Paroxysmal A-fib -Holding metoprolol, Eliquis  COPD -Had recent exacerbation, is on prednisone taper, breathing tx   CKD ruled out    DVT prophylaxis: Holding Eliquis   Code Status: DNR Family Communication: Daughter at bedside Disposition Plan:  Status is: Inpatient Remains inpatient appropriate because: Pending further GI recommendation due to melena   Antimicrobials:  Anti-infectives (From admission, onward)    None        Objective: Vitals:   09/12/22 1845 09/13/22 0042 09/13/22 0422 09/13/22 0811  BP: 133/77 120/64 (!) 140/64   Pulse:   83   Resp:  18 16   Temp:  97.7 F (36.5 C) 98.1 F (36.7 C)   TempSrc:  Oral Oral   SpO2:   100% 100%  Weight:      Height:        Intake/Output Summary (Last 24 hours) at 09/13/2022 1218 Last data filed at 09/12/2022 1800 Gross per 24 hour  Intake 770.64 ml  Output 100 ml  Net 670.64 ml    Filed Weights   09/10/22 1745 09/11/22 0208  Weight: 49.4 kg 45.5 kg    Examination:  General exam: Appears calm and comfortable  Respiratory system: Clear to auscultation. Respiratory effort normal. No respiratory distress. No  conversational dyspnea.  Cardiovascular system: S1 & S2 heard, irreg rhythm. No murmurs. No pedal edema. Gastrointestinal system: Abdomen is nondistended, soft and nontender. Normal bowel sounds heard. Central nervous system: Alert and oriented. No focal neurological deficits. Speech clear.  Extremities: Symmetric in appearance  Skin: No rashes, lesions or  ulcers on exposed skin  Psychiatry: Judgement and insight appear normal. Mood & affect appropriate.   Data Reviewed: I have personally reviewed following labs and imaging studies  CBC: Recent Labs  Lab 09/10/22 1803 09/11/22 1032 09/12/22 0405 09/12/22 1004 09/12/22 1606 09/12/22 2110 09/13/22 0426  WBC 17.3*   < > 12.7* 13.2* 13.2* 12.4* 10.5  NEUTROABS 11.7*  --   --   --   --   --   --   HGB 13.2   < > 8.8* 9.5* 9.9* 9.8* 8.9*  HCT 39.1   < > 27.6* 29.5* 29.9* 30.0* 27.0*  MCV 100.0   < > 103.8* 103.5* 103.1* 103.4* 101.9*  PLT 218   < > 168 202 205 181 179   < > = values in this interval not displayed.    Basic Metabolic Panel: Recent Labs  Lab 09/10/22 1803 09/11/22 1032 09/12/22 0156 09/12/22 0405  NA 131* 134* 132* 134*  K 4.5 4.4 3.9 3.9  CL 93* 102 106 105  CO2 27 24 21* 21*  GLUCOSE 97 116* 139* 133*  BUN 41* 24* 20 18  CREATININE 1.06* 0.87 0.90 0.87  CALCIUM 9.0 8.0* 6.8* 7.1*  MG 2.2  --   --   --     GFR: Estimated Creatinine Clearance: 29.3 mL/min (by C-G formula based on SCr of 0.87 mg/dL). Liver Function Tests: Recent Labs  Lab 09/10/22 1803 09/12/22 0156  AST 18 15  ALT 19 13  ALKPHOS 56 33*  BILITOT 0.9 0.4  PROT 6.6 3.5*  ALBUMIN 4.0 1.9*    Recent Labs  Lab 09/10/22 1803  LIPASE 58*    No results for input(s): "AMMONIA" in the last 168 hours. Coagulation Profile: No results for input(s): "INR", "PROTIME" in the last 168 hours. Cardiac Enzymes: No results for input(s): "CKTOTAL", "CKMB", "CKMBINDEX", "TROPONINI" in the last 168 hours. BNP (last 3 results) No results for input(s): "PROBNP" in the last 8760 hours. HbA1C: No results for input(s): "HGBA1C" in the last 72 hours. CBG: No results for input(s): "GLUCAP" in the last 168 hours. Lipid Profile: No results for input(s): "CHOL", "HDL", "LDLCALC", "TRIG", "CHOLHDL", "LDLDIRECT" in the last 72 hours. Thyroid Function Tests: No results for input(s): "TSH", "T4TOTAL",  "FREET4", "T3FREE", "THYROIDAB" in the last 72 hours. Anemia Panel: No results for input(s): "VITAMINB12", "FOLATE", "FERRITIN", "TIBC", "IRON", "RETICCTPCT" in the last 72 hours. Sepsis Labs: No results for input(s): "PROCALCITON", "LATICACIDVEN" in the last 168 hours.  Recent Results (from the past 240 hour(s))  Resp Panel by RT-PCR (Flu A&B, Covid) Anterior Nasal Swab     Status: None   Collection Time: 09/10/22  6:03 PM   Specimen: Anterior Nasal Swab  Result Value Ref Range Status   SARS Coronavirus 2 by RT PCR NEGATIVE NEGATIVE Final    Comment: (NOTE) SARS-CoV-2 target nucleic acids are NOT DETECTED.  The SARS-CoV-2 RNA is generally detectable in upper respiratory specimens during the acute phase of infection. The lowest concentration of SARS-CoV-2 viral copies this assay can detect is 138 copies/mL. A negative result does not preclude SARS-Cov-2 infection and should not be used as the sole basis for treatment or other patient management decisions.  A negative result may occur with  improper specimen collection/handling, submission of specimen other than nasopharyngeal swab, presence of viral mutation(s) within the areas targeted by this assay, and inadequate number of viral copies(<138 copies/mL). A negative result must be combined with clinical observations, patient history, and epidemiological information. The expected result is Negative.  Fact Sheet for Patients:  EntrepreneurPulse.com.au  Fact Sheet for Healthcare Providers:  IncredibleEmployment.be  This test is no t yet approved or cleared by the [place] FDA and  has been authorized for detection and/or diagnosis of SARS-CoV-2 by FDA under an Emergency Use Authorization (EUA). This EUA will remain  in effect (meaning this test can be used) for the duration of the COVID-19 declaration under Section 564(b)(1) of the Act, 21 U.S.C.section 360bbb-3(b)(1), unless the  authorization is terminated  or revoked sooner.       Influenza A by PCR NEGATIVE NEGATIVE Final   Influenza B by PCR NEGATIVE NEGATIVE Final    Comment: (NOTE) The Xpert Xpress SARS-CoV-2/FLU/RSV plus assay is intended as an aid in the diagnosis of influenza from Nasopharyngeal swab specimens and should not be used as a sole basis for treatment. Nasal washings and aspirates are unacceptable for Xpert Xpress SARS-CoV-2/FLU/RSV testing.  Fact Sheet for Patients: EntrepreneurPulse.com.au  Fact Sheet for Healthcare Providers: IncredibleEmployment.be  This test is not yet approved or cleared by the [place] FDA and has been authorized for detection and/or diagnosis of SARS-CoV-2 by FDA under an Emergency Use Authorization (EUA). This EUA will remain in effect (meaning this test can be used) for the duration of the COVID-19 declaration under Section 564(b)(1) of the Act, 21 U.S.C. section 360bbb-3(b)(1), unless the authorization is terminated or revoked.  Performed at [facility], [address], [place], [place] [address]       Radiology Studies: No results found.    Scheduled Meds:  fluticasone furoate-vilanterol  1 puff Inhalation Daily   And   umeclidinium bromide  1 puff Inhalation Daily   influenza vaccine adjuvanted  0.5 mL Intramuscular Tomorrow-1000   magnesium chloride  1 tablet Oral BID   pantoprazole  40 mg Oral Daily   predniSONE  20 mg Oral Q breakfast   Continuous Infusions:  sodium chloride 75 mL/hr at 09/12/22 2210     LOS: 2 days     Dessa Phi, DO [facility] 09/13/2022, 12:18 PM   Available via Epic secure chat 7am-7pm After these hours, please refer to coverage provider listed on [email or web address]

## 2022-09-13 NOTE — Progress Notes (Signed)
Mobility Specialist Progress Note:   09/13/22 1034  Mobility  Activity Ambulated with assistance in hallway  Activity Response Tolerated well  Distance Ambulated (ft) 240 ft  $Mobility charge 1 Mobility  Level of Assistance Standby assist, set-up cues, supervision of patient - no hands on  Assistive Device  (iv pole)   Pt received in bed willing to participate in mobility. No complaints of pain. Left in bed with call bell in reach and all needs met.   [facility] Surveyor, mining Chat only

## 2022-09-14 ENCOUNTER — Institutional Professional Consult (permissible substitution): Payer: Medicare Other | Admitting: Pulmonary Disease

## 2022-09-14 ENCOUNTER — Encounter (HOSPITAL_COMMUNITY)
Admission: EM | Disposition: A | Discharge status: Home / Self Care | Payer: Self-pay | Source: Home / Self Care | Attending: Internal Medicine

## 2022-09-14 ENCOUNTER — Inpatient Hospital Stay (HOSPITAL_COMMUNITY): Payer: Medicare Other | Admitting: Critical Care Medicine

## 2022-09-14 ENCOUNTER — Encounter (HOSPITAL_COMMUNITY): Payer: Self-pay | Admitting: Internal Medicine

## 2022-09-14 DIAGNOSIS — I509 Heart failure, unspecified: Secondary | ICD-10-CM

## 2022-09-14 DIAGNOSIS — Z7722 Contact with and (suspected) exposure to environmental tobacco smoke (acute) (chronic): Secondary | ICD-10-CM

## 2022-09-14 DIAGNOSIS — R112 Nausea with vomiting, unspecified: Secondary | ICD-10-CM | POA: Diagnosis not present

## 2022-09-14 DIAGNOSIS — J449 Chronic obstructive pulmonary disease, unspecified: Secondary | ICD-10-CM

## 2022-09-14 DIAGNOSIS — R197 Diarrhea, unspecified: Secondary | ICD-10-CM | POA: Diagnosis not present

## 2022-09-14 DIAGNOSIS — I2510 Atherosclerotic heart disease of native coronary artery without angina pectoris: Secondary | ICD-10-CM

## 2022-09-14 DIAGNOSIS — I110 Hypertensive heart disease with heart failure: Secondary | ICD-10-CM

## 2022-09-14 DIAGNOSIS — K279 Peptic ulcer, site unspecified, unspecified as acute or chronic, without hemorrhage or perforation: Secondary | ICD-10-CM

## 2022-09-14 HISTORY — PX: BIOPSY: SHX5522

## 2022-09-14 HISTORY — PX: ESOPHAGOGASTRODUODENOSCOPY (EGD) WITH PROPOFOL: SHX5813

## 2022-09-14 LAB — CBC
HCT: 27.3 % — ABNORMAL LOW (ref 36.0–46.0)
Hemoglobin: 9.1 g/dL — ABNORMAL LOW (ref 12.0–15.0)
MCH: 33.6 pg (ref 26.0–34.0)
MCHC: 33.3 g/dL (ref 30.0–36.0)
MCV: 100.7 fL — ABNORMAL HIGH (ref 80.0–100.0)
Platelets: 193 10*3/uL (ref 150–400)
RBC: 2.71 MIL/uL — ABNORMAL LOW (ref 3.87–5.11)
RDW: 13.8 % (ref 11.5–15.5)
WBC: 10.1 10*3/uL (ref 4.0–10.5)
nRBC: 0 % (ref 0.0–0.2)

## 2022-09-14 SURGERY — ESOPHAGOGASTRODUODENOSCOPY (EGD) WITH PROPOFOL
Anesthesia: Monitor Anesthesia Care

## 2022-09-14 MED ORDER — SODIUM CHLORIDE 0.9 % IV SOLN: INTRAVENOUS | Status: DC

## 2022-09-14 MED ORDER — PROPOFOL 10 MG/ML IV BOLUS
INTRAVENOUS | Status: DC | PRN
  Administered 2022-09-14 (×3): 10 mg via INTRAVENOUS

## 2022-09-14 MED ORDER — LISINOPRIL 10 MG PO TABS
10.0000 mg | ORAL_TABLET | Freq: Every day | ORAL | Status: DC
  Administered 2022-09-15: 10 mg via ORAL
  Filled 2022-09-14: qty 1

## 2022-09-14 MED ORDER — PROPOFOL 500 MG/50ML IV EMUL
INTRAVENOUS | Status: DC | PRN
  Administered 2022-09-14: 100 ug/kg/min via INTRAVENOUS

## 2022-09-14 MED ORDER — HYDROCHLOROTHIAZIDE 12.5 MG PO TABS
12.5000 mg | ORAL_TABLET | Freq: Every day | ORAL | Status: DC
  Administered 2022-09-15: 12.5 mg via ORAL
  Filled 2022-09-14: qty 1

## 2022-09-14 MED ORDER — METOPROLOL SUCCINATE ER 25 MG PO TB24
25.0000 mg | ORAL_TABLET | Freq: Every day | ORAL | Status: DC
  Administered 2022-09-14: 25 mg via ORAL
  Filled 2022-09-14: qty 1

## 2022-09-14 MED ORDER — METOPROLOL SUCCINATE ER 25 MG PO TB24
12.5000 mg | ORAL_TABLET | Freq: Every day | ORAL | Status: DC
  Administered 2022-09-15: 12.5 mg via ORAL
  Filled 2022-09-14: qty 1

## 2022-09-14 MED ORDER — PREDNISONE 10 MG PO TABS
10.0000 mg | ORAL_TABLET | Freq: Every day | ORAL | Status: DC
  Administered 2022-09-15: 10 mg via ORAL
  Filled 2022-09-14: qty 1

## 2022-09-14 MED ORDER — SUCRALFATE 1 GM/10ML PO SUSP
1.0000 g | Freq: Three times a day (TID) | ORAL | Status: DC
  Administered 2022-09-14 – 2022-09-15 (×5): 1 g via ORAL
  Filled 2022-09-14 (×5): qty 10

## 2022-09-14 MED ORDER — PANTOPRAZOLE SODIUM 40 MG PO TBEC
40.0000 mg | DELAYED_RELEASE_TABLET | Freq: Two times a day (BID) | ORAL | Status: DC
  Administered 2022-09-14 – 2022-09-15 (×2): 40 mg via ORAL
  Filled 2022-09-14 (×2): qty 1

## 2022-09-14 SURGICAL SUPPLY — 15 items

## 2022-09-14 NOTE — Op Note (Signed)
[facility] Patient Name: Sydney Gross Procedure Date : 09/14/2022 MRN: [id number] Attending MD: Carol Ada , MD Date of Birth: 12/19/1929 CSN: [id number] Age: 88 Admit Type: Inpatient Procedure:                Upper GI endoscopy Indications:              Melena Providers:                Carol Ada, MD, Grace Isaac, RN, Cletis Athens,                            Technician Referring MD:              Medicines:                Propofol per Anesthesia Complications:            No immediate complications. Estimated Blood Loss:     Estimated blood loss: none. Procedure:                Pre-Anesthesia Assessment:                           - Prior to the procedure, a History and Physical                            was performed, and patient medications and                            allergies were reviewed. The patient's tolerance of                            previous anesthesia was also reviewed. The risks                            and benefits of the procedure and the sedation                            options and risks were discussed with the patient.                            All questions were answered, and informed consent                            was obtained. Prior Anticoagulants: The patient has                            taken Eliquis (apixaban), last dose was 5 days                            prior to procedure. ASA Grade Assessment: III - A                            patient with severe systemic disease. After  reviewing the risks and benefits, the patient was                            deemed in satisfactory condition to undergo the                            procedure.                           - Sedation was administered by an anesthesia                            professional. Deep sedation was attained.                           After obtaining informed consent, the endoscope was                            passed under  direct vision. Throughout the                            procedure, the patient's blood pressure, pulse, and                            oxygen saturations were monitored continuously. The                            [id number] Olympus endoscope was introduced                            through the mouth, and advanced to the second part                            of duodenum. The upper GI endoscopy was                            accomplished without difficulty. The patient                            tolerated the procedure well. Scope In: Scope Out: Findings:      The esophagus was normal.      Three non-bleeding superficial gastric ulcers with a clean ulcer base       (Forrest Class III) were found in the gastric antrum. The largest lesion       was 15 mm in largest dimension. Biopsies were taken with a cold forceps       for Helicobacter pylori testing.      Two non-bleeding superficial duodenal ulcers with no stigmata of       bleeding were found in the duodenal bulb. The largest lesion was 10 mm       in largest dimension.      Three superficial, but large ulcers were found in the antrum. Two       superficial ulcers were found in the bulb. All were clean-based. Impression:               -  Normal esophagus.                           - Non-bleeding gastric ulcers with a clean ulcer                            base (Forrest Class III). Biopsied.                           - Non-bleeding duodenal ulcers with no stigmata of                            bleeding. Recommendation:           - Resume regular diet.                           - Continue present medications.                           - Await pathology results.                           - PPI BID x 1 month.                           - Sucralfate TID.                           - Hold Eliquis for another 3-5 days if possible.                           - Follow up with Dr. Collene Mares in 2-4 weeks. Procedure Code(s):        ---  Professional ---                           [phone], Esophagogastroduodenoscopy, flexible,                            transoral; with biopsy, single or multiple Diagnosis Code(s):        --- Professional ---                           K25.9, Gastric ulcer, unspecified as acute or                            chronic, without hemorrhage or perforation                           K26.9, Duodenal ulcer, unspecified as acute or                            chronic, without hemorrhage or perforation                           K92.1, Melena (includes Hematochezia) CPT copyright 2019 American Medical Association. All rights reserved. The codes documented in this report are preliminary  and upon coder review may  be revised to meet current compliance requirements. Carol Ada, MD Carol Ada, MD 09/14/2022 11:36:46 AM This report has been signed electronically. Number of Addenda: 0

## 2022-09-14 NOTE — Transfer of Care (Signed)
Immediate Anesthesia Transfer of Care Note  Patient: MADESYN AST  Procedure(s) Performed: ESOPHAGOGASTRODUODENOSCOPY (EGD) WITH PROPOFOL BIOPSY  Patient Location: PACU  Anesthesia Type:MAC  Level of Consciousness: awake and alert   Airway & Oxygen Therapy: Patient Spontanous Breathing  Post-op Assessment: Report given to RN and Post -op Vital signs reviewed and stable  Post vital signs: Reviewed and stable  Last Vitals:  Vitals Value Taken Time  BP 117/47 09/14/22 1130  Temp 36.5 C 09/14/22 1130  Pulse 70 09/14/22 1132  Resp 23 09/14/22 1132  SpO2 97 % 09/14/22 1132  Vitals shown include unvalidated device data.  Last Pain:  Vitals:   09/14/22 1130  TempSrc:   PainSc: 0-No pain         Complications: No notable events documented.

## 2022-09-14 NOTE — Progress Notes (Signed)
Mobility Specialist - Progress Note   09/14/22 0935  Mobility  Activity Ambulated with assistance in hallway  Level of Assistance Standby assist, set-up cues, supervision of patient - no hands on  Assistive Device Other (Comment) (iv pole)  Distance Ambulated (ft) 200 ft  Activity Response Tolerated well  Mobility Referral Yes  $Mobility charge 1 Mobility   Pt was received in chair and agreeable. No complaints throughout. Pt was returned to EOB with all needs met.   Larey Seat

## 2022-09-14 NOTE — Interval H&P Note (Signed)
History and Physical Interval Note:  09/14/2022 11:09 AM  Sydney Gross  has presented today for surgery, with the diagnosis of Melena and anemia..  The various methods of treatment have been discussed with the patient and family. After consideration of risks, benefits and other options for treatment, the patient has consented to  Procedure(s) with comments: ESOPHAGOGASTRODUODENOSCOPY (EGD) WITH PROPOFOL (N/A) - CAD, COPD, PAF as a surgical intervention.  The patient's history has been reviewed, patient examined, no change in status, stable for surgery.  I have reviewed the patient's chart and labs.  Questions were answered to the patient's satisfaction.     [name]

## 2022-09-14 NOTE — Progress Notes (Signed)
PROGRESS NOTE    Sydney Gross  [id number] DOB: May 29, 1930 DOA: 09/10/2022 PCP: Mayra Neer, MD     Brief Narrative:  Sydney Gross is a very pleasant 88 y.o. female with medical history significant for COPD, CAD, PAD, chronic diastolic CHF, PAF on Eliquis, CKD 3A, hypertension, and recent admission for COPD exacerbation who presents to the emergency department with abdominal pain, nausea, vomiting, diarrhea, and dizziness. Patient reports 1 week of intermittent upper abdominal pain with nausea, nonbloody vomiting, and some loose stools.  She was also experiencing a room spinning sensation.  She had a mild ache in her chest on 09/06/2022 but no chest discomfort since.  CT of the abdomen and pelvis is negative for acute findings.  There is no acute intracranial abnormality on head CT and CTA of the head and neck is negative for large vessel occlusion. ED physician discussed case with cardiology (Dr. Myles Gip) and it was recommended that patient be observed on heart monitor overnight and echo repeated in the morning.  Cardiology advised against starting heparin.   Overnight 10/8, patient had episode of hypotension requiring IV fluid and midodrine.  There was concern for GI bleeding.  GI was consulted.  New events last 24 hours / Subjective: Patient underwent EGD this morning; they found nonbleeding gastric ulcers with clean ulcer base, nonbleeding duodenal ulcers.  Patient examined, she is eating lunch with spouse at bedside.  She has no physical complaints this morning.  She tells me that Dr. Benson Norway would like to monitor her overnight and potential discharge in the morning.  Assessment & Plan:   Principal Problem:   Nausea, vomiting and diarrhea Active Problems:   Stage 3a chronic kidney disease (CKD) (HCC)   COPD (chronic obstructive pulmonary disease) (HCC)   Chronic diastolic CHF (congestive heart failure) (HCC)   Abdominal pain   Paroxysmal atrial fibrillation (HCC)    Elevated troponin   Hyponatremia   Melena, upper GI bleeding with gastric and duodenal ulcers -Previous history of nonbleeding gastric ulcer in 2022 -Baseline Hgb 11-13  -FOBT positive -EGD 10/10: Non-bleeding gastric ulcers with a clean ulcer base, Non-bleeding duodenal ulcers with no stigmata of bleeding -Hold Eliquis for another 3 to 5 days -PPI twice daily for 1 month, sucralfate 3 times daily -Follow-up with Dr. Collene Mares in 2 to 4 weeks -Hemoglobin stable this morning  Hypertension -Blood pressure medications were discontinued due to hypotension.  Now blood pressure is elevated.  Resume metoprolol, lisinopril/HCTZ and monitor  Demand ischemia -Troponin has trended downward [phone]  -Echocardiogram EF 65 to 70%, no wall motion abnormality -No chest pain   Paroxysmal A-fib -Metoprolol, Eliquis  COPD -Had recent exacerbation, is on prednisone taper, breathing tx   CKD ruled out    DVT prophylaxis: Holding Eliquis   Code Status: DNR Family Communication: Spouse at bedside Disposition Plan:  Status is: Inpatient Remains inpatient appropriate because: Plan for discharge 10/11 as long as blood pressure and hemoglobin remain stable   Antimicrobials:  Anti-infectives (From admission, onward)    None        Objective: Vitals:   09/14/22 0622 09/14/22 1003 09/14/22 1130 09/14/22 1145  BP: (!) 184/90 (!) 182/90 (!) 117/47 (!) 147/66  Pulse:  78 71 67  Resp:  (!) 23 20 19   Temp:  97.7 F (36.5 C) 97.7 F (36.5 C)   TempSrc:  Temporal    SpO2: 99% 98% 96% 95%  Weight:  45.5 kg    Height:  4\' 11"  (1.499 m)  Intake/Output Summary (Last 24 hours) at 09/14/2022 1315 Last data filed at 09/14/2022 1123 Gross per 24 hour  Intake 200 ml  Output --  Net 200 ml   Filed Weights   09/10/22 1745 09/11/22 0208 09/14/22 1003  Weight: 49.4 kg 45.5 kg 45.5 kg    Examination:  General exam: Appears calm and comfortable  Respiratory system: Clear to  auscultation. Respiratory effort normal. No respiratory distress. No conversational dyspnea.  Cardiovascular system: S1 & S2 heard, reg rhythm. No murmurs. No pedal edema. Gastrointestinal system: Abdomen is nondistended, soft and nontender. Normal bowel sounds heard. Central nervous system: Alert and oriented. No focal neurological deficits. Speech clear.  Extremities: Symmetric in appearance  Skin: No rashes, lesions or ulcers on exposed skin  Psychiatry: Judgement and insight appear normal. Mood & affect appropriate.   Data Reviewed: I have personally reviewed following labs and imaging studies  CBC: Recent Labs  Lab 09/10/22 1803 09/11/22 1032 09/12/22 1004 09/12/22 1606 09/12/22 2110 09/13/22 0426 09/14/22 0012  WBC 17.3*   < > 13.2* 13.2* 12.4* 10.5 10.1  NEUTROABS 11.7*  --   --   --   --   --   --   HGB 13.2   < > 9.5* 9.9* 9.8* 8.9* 9.1*  HCT 39.1   < > 29.5* 29.9* 30.0* 27.0* 27.3*  MCV 100.0   < > 103.5* 103.1* 103.4* 101.9* 100.7*  PLT 218   < > 202 205 181 179 193   < > = values in this interval not displayed.   Basic Metabolic Panel: Recent Labs  Lab 09/10/22 1803 09/11/22 1032 09/12/22 0156 09/12/22 0405  NA 131* 134* 132* 134*  K 4.5 4.4 3.9 3.9  CL 93* 102 106 105  CO2 27 24 21* 21*  GLUCOSE 97 116* 139* 133*  BUN 41* 24* 20 18  CREATININE 1.06* 0.87 0.90 0.87  CALCIUM 9.0 8.0* 6.8* 7.1*  MG 2.2  --   --   --    GFR: Estimated Creatinine Clearance: 29.3 mL/min (by C-G formula based on SCr of 0.87 mg/dL). Liver Function Tests: Recent Labs  Lab 09/10/22 1803 09/12/22 0156  AST 18 15  ALT 19 13  ALKPHOS 56 33*  BILITOT 0.9 0.4  PROT 6.6 3.5*  ALBUMIN 4.0 1.9*   Recent Labs  Lab 09/10/22 1803  LIPASE 58*   No results for input(s): "AMMONIA" in the last 168 hours. Coagulation Profile: No results for input(s): "INR", "PROTIME" in the last 168 hours. Cardiac Enzymes: No results for input(s): "CKTOTAL", "CKMB", "CKMBINDEX", "TROPONINI" in  the last 168 hours. BNP (last 3 results) No results for input(s): "PROBNP" in the last 8760 hours. HbA1C: No results for input(s): "HGBA1C" in the last 72 hours. CBG: No results for input(s): "GLUCAP" in the last 168 hours. Lipid Profile: No results for input(s): "CHOL", "HDL", "LDLCALC", "TRIG", "CHOLHDL", "LDLDIRECT" in the last 72 hours. Thyroid Function Tests: No results for input(s): "TSH", "T4TOTAL", "FREET4", "T3FREE", "THYROIDAB" in the last 72 hours. Anemia Panel: No results for input(s): "VITAMINB12", "FOLATE", "FERRITIN", "TIBC", "IRON", "RETICCTPCT" in the last 72 hours. Sepsis Labs: No results for input(s): "PROCALCITON", "LATICACIDVEN" in the last 168 hours.  Recent Results (from the past 240 hour(s))  Resp Panel by RT-PCR (Flu A&B, Covid) Anterior Nasal Swab     Status: None   Collection Time: 09/10/22  6:03 PM   Specimen: Anterior Nasal Swab  Result Value Ref Range Status   SARS Coronavirus 2 by RT PCR NEGATIVE  NEGATIVE Final    Comment: (NOTE) SARS-CoV-2 target nucleic acids are NOT DETECTED.  The SARS-CoV-2 RNA is generally detectable in upper respiratory specimens during the acute phase of infection. The lowest concentration of SARS-CoV-2 viral copies this assay can detect is 138 copies/mL. A negative result does not preclude SARS-Cov-2 infection and should not be used as the sole basis for treatment or other patient management decisions. A negative result may occur with  improper specimen collection/handling, submission of specimen other than nasopharyngeal swab, presence of viral mutation(s) within the areas targeted by this assay, and inadequate number of viral copies(<138 copies/mL). A negative result must be combined with clinical observations, patient history, and epidemiological information. The expected result is Negative.  Fact Sheet for Patients:  EntrepreneurPulse.com.au  Fact Sheet for Healthcare Providers:   IncredibleEmployment.be  This test is no t yet approved or cleared by the [place] FDA and  has been authorized for detection and/or diagnosis of SARS-CoV-2 by FDA under an Emergency Use Authorization (EUA). This EUA will remain  in effect (meaning this test can be used) for the duration of the COVID-19 declaration under Section 564(b)(1) of the Act, 21 U.S.C.section 360bbb-3(b)(1), unless the authorization is terminated  or revoked sooner.       Influenza A by PCR NEGATIVE NEGATIVE Final   Influenza B by PCR NEGATIVE NEGATIVE Final    Comment: (NOTE) The Xpert Xpress SARS-CoV-2/FLU/RSV plus assay is intended as an aid in the diagnosis of influenza from Nasopharyngeal swab specimens and should not be used as a sole basis for treatment. Nasal washings and aspirates are unacceptable for Xpert Xpress SARS-CoV-2/FLU/RSV testing.  Fact Sheet for Patients: EntrepreneurPulse.com.au  Fact Sheet for Healthcare Providers: IncredibleEmployment.be  This test is not yet approved or cleared by the [place] FDA and has been authorized for detection and/or diagnosis of SARS-CoV-2 by FDA under an Emergency Use Authorization (EUA). This EUA will remain in effect (meaning this test can be used) for the duration of the COVID-19 declaration under Section 564(b)(1) of the Act, 21 U.S.C. section 360bbb-3(b)(1), unless the authorization is terminated or revoked.  Performed at [facility], [address] [place], [place] [address]       Radiology Studies: No results found.    Scheduled Meds:  fluticasone furoate-vilanterol  1 puff Inhalation Daily   And   umeclidinium bromide  1 puff Inhalation Daily   [START ON 09/15/2022] hydrochlorothiazide  12.5 mg Oral Daily   influenza vaccine adjuvanted  0.5 mL Intramuscular Tomorrow-1000   [START ON 09/15/2022] lisinopril  10 mg Oral Daily   magnesium chloride   1 tablet Oral BID   [START ON 09/15/2022] metoprolol succinate  12.5 mg Oral Daily   pantoprazole  40 mg Oral BID AC   [START ON 09/15/2022] predniSONE  10 mg Oral Q breakfast   sucralfate  1 g Oral TID WC & HS   Continuous Infusions:     LOS: 3 days     Dessa Phi, DO [facility] 09/14/2022, 1:15 PM   Available via Epic secure chat 7am-7pm After these hours, please refer to coverage provider listed on [email or web address]

## 2022-09-14 NOTE — Anesthesia Postprocedure Evaluation (Signed)
Anesthesia Post Note  Patient: Sydney Gross  Procedure(s) Performed: ESOPHAGOGASTRODUODENOSCOPY (EGD) WITH PROPOFOL BIOPSY     Patient location during evaluation: Endoscopy Anesthesia Type: MAC Level of consciousness: awake and alert Pain management: pain level controlled Vital Signs Assessment: post-procedure vital signs reviewed and stable Respiratory status: spontaneous breathing, nonlabored ventilation, respiratory function stable and patient connected to nasal cannula oxygen Cardiovascular status: stable and blood pressure returned to baseline Postop Assessment: no apparent nausea or vomiting Anesthetic complications: no   No notable events documented.  Last Vitals:  Vitals:   09/14/22 1130 09/14/22 1145  BP: (!) 117/47 (!) 147/66  Pulse: 71 67  Resp: 20 19  Temp: 36.5 C   SpO2: 96% 95%    Last Pain:  Vitals:   09/14/22 1145  TempSrc:   PainSc: 0-No pain                 Evoleht Hovatter S

## 2022-09-14 NOTE — Anesthesia Preprocedure Evaluation (Signed)
Anesthesia Evaluation  Patient identified by MRN, date of birth, ID band Patient awake    Reviewed: Allergy & Precautions, H&P , NPO status , Patient's Chart, lab work & pertinent test results  Airway Mallampati: II   Neck ROM: full    Dental   Pulmonary COPD,    breath sounds clear to auscultation       Cardiovascular hypertension, + CAD, + Peripheral Vascular Disease and +CHF  + dysrhythmias Atrial Fibrillation  Rhythm:regular Rate:Normal     Neuro/Psych  Neuromuscular disease    GI/Hepatic hiatal hernia, PUD, GI bleeding   Endo/Other    Renal/GU      Musculoskeletal  (+) Arthritis ,   Abdominal   Peds  Hematology  (+) Blood dyscrasia, anemia ,   Anesthesia Other Findings   Reproductive/Obstetrics                             Anesthesia Physical Anesthesia Plan  ASA: 3  Anesthesia Plan: MAC   Post-op Pain Management:    Induction: Intravenous  PONV Risk Score and Plan: 2 and Propofol infusion and Treatment may vary due to age or medical condition  Airway Management Planned: Nasal Cannula  Additional Equipment:   Intra-op Plan:   Post-operative Plan:   Informed Consent: I have reviewed the patients History and Physical, chart, labs and discussed the procedure including the risks, benefits and alternatives for the proposed anesthesia with the patient or authorized representative who has indicated his/her understanding and acceptance.     Dental advisory given  Plan Discussed with: CRNA, Anesthesiologist and Surgeon  Anesthesia Plan Comments:         Anesthesia Quick Evaluation

## 2022-09-14 NOTE — Consult Note (Signed)
   [facility] CM Inpatient Consult   09/14/2022  IRENE COLLINGS [birth year] [id number]  [place] [facility] [ACO] Patient: [place] Medicare  Primary Care Provider:  Mayra Neer, MD confirmed by patient , this provider is listed for post hospital transition of care [TOC] calls/follow up  Patient screened for less than 30 day readmission hospitalization with noted high risk score for unplanned readmission risk and  to assess for potential [place] [facility] service needs for post hospital transition.   Came by and confirmed with patient PCP for post hospital follow up.  No additional needs noted as reviewed in electronic medical record.  Husband and daughter in attendance. Notes reviewed for care needs and SDOH notes are intact. Patient states no additional needs at this time.  Plan:  Continue to follow progress and disposition to assess for post hospital care management needs.  Currently, no care coordination needs were assessed.  For questions contact:   Natividad Brood, RN BSN [place]  [phone] business mobile phone Toll free office [phone]  *[place]  [phone] Fax number: [phone] [email or web address]

## 2022-09-15 DIAGNOSIS — R109 Unspecified abdominal pain: Secondary | ICD-10-CM

## 2022-09-15 DIAGNOSIS — K257 Chronic gastric ulcer without hemorrhage or perforation: Secondary | ICD-10-CM

## 2022-09-15 DIAGNOSIS — K921 Melena: Secondary | ICD-10-CM

## 2022-09-15 DIAGNOSIS — I5032 Chronic diastolic (congestive) heart failure: Secondary | ICD-10-CM

## 2022-09-15 LAB — CBC
HCT: 30.5 % — ABNORMAL LOW (ref 36.0–46.0)
Hemoglobin: 9.9 g/dL — ABNORMAL LOW (ref 12.0–15.0)
MCH: 33.8 pg (ref 26.0–34.0)
MCHC: 32.5 g/dL (ref 30.0–36.0)
MCV: 104.1 fL — ABNORMAL HIGH (ref 80.0–100.0)
Platelets: 216 10*3/uL (ref 150–400)
RBC: 2.93 MIL/uL — ABNORMAL LOW (ref 3.87–5.11)
RDW: 14 % (ref 11.5–15.5)
WBC: 14.4 10*3/uL — ABNORMAL HIGH (ref 4.0–10.5)
nRBC: 0.1 % (ref 0.0–0.2)

## 2022-09-15 LAB — SURGICAL PATHOLOGY

## 2022-09-15 MED ORDER — SUCRALFATE 1 GM/10ML PO SUSP: 1.0000 g | Freq: Three times a day (TID) | ORAL | 0 refills | Status: DC

## 2022-09-15 MED ORDER — PANTOPRAZOLE SODIUM 40 MG PO TBEC: 40.0000 mg | DELAYED_RELEASE_TABLET | Freq: Two times a day (BID) | ORAL | 0 refills | Status: DC

## 2022-09-15 MED ORDER — PREDNISONE 2.5 MG PO TABS: ORAL_TABLET | ORAL | 0 refills | Status: AC

## 2022-09-15 NOTE — Care Management Important Message (Signed)
Important Message  Patient Details  Name: Sydney Gross MRN: [id number] Date of Birth: 1930/09/29   Medicare Important Message Given:  Yes     Shelda Altes 09/15/2022, 11:57 AM

## 2022-09-15 NOTE — Plan of Care (Signed)

## 2022-09-15 NOTE — Discharge Summary (Signed)
Physician Discharge Summary   Patient: Sydney Gross MRN: [id number] DOB: 06-16-1930  Admit date:     09/10/2022  Discharge date: 09/15/22  Discharge Physician: Patrecia Pour   PCP: Mayra Neer, MD   Recommendations at discharge:  Follow up with Dr. Collene Mares in 2-4 weeks and follow up results of gastric ulcer biopsy taken 10/10 at EGD by Dr. Benson Norway.  - Take protonix twice daily for the next month and take carafate 3 times daily for 2 weeks to help the ulcer heal.  - You will need to follow up with your GI doctor, Dr. Collene Mares, in 2-4 weeks. If you are not contacted to schedule this, please call their office. - You will also need to follow up with your primary doctor in 1-2 weeks and have repeat lab work. Seek medical attention emergently if you notice any bleeding, shortness of breath or chest pain.  Discharge Diagnoses: Principal Problem:   Melena Active Problems:   Stage 3a chronic kidney disease (CKD) (HCC)   COPD (chronic obstructive pulmonary disease) (HCC)   Chronic diastolic CHF (congestive heart failure) (HCC)   Gastric ulcer   Gastrointestinal bleed   Abdominal pain   Paroxysmal atrial fibrillation (HCC)   Elevated troponin   Hyponatremia   Nausea, vomiting and diarrhea  Hospital Course: Sydney Gross is a very pleasant 88 y.o. female with medical history significant for COPD, CAD, PAD, chronic diastolic CHF, PAF on Eliquis, CKD 3A, hypertension, and recent admission for COPD exacerbation who presents to the emergency department with abdominal pain, nausea, vomiting, diarrhea, and dizziness. Patient reports 1 week of intermittent upper abdominal pain with nausea, nonbloody vomiting, and some loose stools.  She was also experiencing a room spinning sensation.  She had a mild ache in her chest on 09/06/2022 but no chest discomfort since.   CT of the abdomen and pelvis is negative for acute findings.  There is no acute intracranial abnormality on head CT and CTA of the head and  neck is negative for large vessel occlusion. ED physician discussed case with cardiology (Dr. Myles Gip) and it was recommended that patient be observed on heart monitor overnight and echo repeated in the morning.  Cardiology advised against starting heparin.    Overnight 10/8, patient had episode of hypotension requiring IV fluid and midodrine.  There was concern for GI bleeding.  GI was consulted. See below for problem-based hospital course  Assessment and Plan: Melena, upper GI bleeding with gastric and duodenal ulcers -Previous history of nonbleeding gastric ulcer in 2022 -FOBT positive -EGD 10/10: Non-bleeding gastric ulcers with a clean ulcer base, Non-bleeding duodenal ulcers with no stigmata of bleeding -Hold Eliquis for another 3 to 5 days -PPI twice daily for 1 month, sucralfate 3 times daily -Follow-up with Dr. Collene Mares in 2 to 4 weeks -Hemoglobin stable this morning   Hypertension: Resume metoprolol, lisinopril/HCTZ since BPs were elevated off medications.   Demand ischemia: ACS ruled out. -Troponin has trended downward 155>135>97>87  -Echocardiogram EF 65 to 70%, no wall motion abnormality - Consider outpatient risk stratification.   Paroxysmal A-fib - Metoprolol to continue, holding eliquis until 10/14 per GI   COPD - Had recent exacerbation, is on prednisone taper which we will continue.   CKD ruled out   Consultants: GI Procedures performed: EGD Impression:        - Normal esophagus.                           -  Non-bleeding gastric ulcers with a clean ulcer                            base (Forrest Class III). Biopsied.                           - Non-bleeding duodenal ulcers with no stigmata of                            bleeding. Recommendation: Resume regular diet.                           - Continue present medications.                           - Await pathology results.                           - PPI BID x 1 month.                           - Sucralfate TID.                            - Hold Eliquis for another 3-5 days if possible.                           - Follow up with Dr. Collene Mares in 2-4 weeks. Disposition: Home Diet recommendation: As tolerated DISCHARGE MEDICATION: Allergies as of 09/15/2022       Reactions   Brilinta [ticagrelor] Shortness Of Breath   Codeine Nausea And Vomiting   Darvon [propoxyphene] Nausea Only   Lipitor [atorvastatin] Other (See Comments)   MYALGIAS   Rosuvastatin Other (See Comments)   MYALGIAS   Simvastatin Other (See Comments)   MYALGIAS   Tramadol Hcl Nausea Only   nausea (08/2019)        Medication List     STOP taking these medications    doxycycline 100 MG tablet Commonly known as: VIBRA-TABS       TAKE these medications    albuterol 108 (90 Base) MCG/ACT inhaler Commonly known as: VENTOLIN HFA Inhale 1 puff into the lungs every 6 (six) hours as needed for shortness of breath.   albuterol (2.5 MG/3ML) 0.083% nebulizer solution Commonly known as: PROVENTIL Take 3 mLs (2.5 mg total) by nebulization every 2 (two) hours as needed for wheezing or shortness of breath.   apixaban 2.5 MG Tabs tablet Commonly known as: ELIQUIS Take 1 tablet (2.5 mg total) by mouth 2 (two) times daily.   Breztri Aerosphere 160-9-4.8 MCG/ACT Aero Generic drug: Budeson-Glycopyrrol-Formoterol Inhale 2 puffs into the lungs 2 (two) times daily as needed (shortness of breath/ COPD).   denosumab 60 MG/ML Sosy injection Commonly known as: PROLIA Inject 60 mg as directed every 6 (six) months.   diphenhydramine-acetaminophen 25-500 MG Tabs tablet Commonly known as: TYLENOL PM Take 1 tablet by mouth at bedtime.   ezetimibe 10 MG tablet Commonly known as: ZETIA Take 1 tablet by mouth once daily   ipratropium-albuterol 0.5-2.5 (3) MG/3ML Soln Commonly known as: DUONEB Take 3 mLs by nebulization 3 (three) times daily.   lisinopril-hydrochlorothiazide  20-25 MG tablet Commonly known as: ZESTORETIC Take 0.5 tablets  by mouth in the morning. What changed: when to take this   metoprolol succinate 25 MG 24 hr tablet Commonly known as: TOPROL-XL Take 1 tablet (25 mg total) by mouth daily. Pt needs to make appt to see provider for further refills - 2nd attempt What changed: additional instructions   metoprolol tartrate 25 MG tablet Commonly known as: LOPRESSOR Take one half - one tablet (12.5 mg - 25 mg) as needed for Heart Rate 100 or above. What changed:  how much to take how to take this when to take this reasons to take this additional instructions   nitroGLYCERIN 0.4 MG SL tablet Commonly known as: NITROSTAT Place 0.4 mg under the tongue every 5 (five) minutes as needed for chest pain.   pantoprazole 40 MG tablet Commonly known as: PROTONIX Take 1 tablet (40 mg total) by mouth 2 (two) times daily before a meal.   predniSONE 2.5 MG tablet Commonly known as: DELTASONE Take 4 tablets (10 mg total) by mouth daily with breakfast for 3 days, THEN 3 tablets (7.5 mg total) daily with breakfast for 3 days, THEN 2 tablets (5 mg total) daily with breakfast for 3 days, THEN 1 tablet (2.5 mg total) daily with breakfast for 3 days. Start taking on: September 16, 2022 What changed:  medication strength See the new instructions.   Repatha SureClick [id number] MG/ML Soaj Generic drug: Evolocumab INJECT 1 PEN INTO THE SKIN EVERY 14 DAYS What changed: See the new instructions.   SLOW-MAG PO Take 1 tablet by mouth 2 (two) times daily.   sucralfate 1 GM/10ML suspension Commonly known as: CARAFATE Take 10 mLs (1 g total) by mouth 3 (three) times daily.        Follow-up Information     Mayra Neer, MD Follow up.   Specialty: Family Medicine Contact information: [address] [place] [address] [phone]         Juanita Craver, MD Follow up in 2 week(s).   Specialty: Gastroenterology Contact information: [address] [place]  [address] [phone]                Discharge Exam: Filed Weights   09/10/22 1745 09/11/22 0208 09/14/22 1003  Weight: 49.4 kg 45.5 kg 45.5 kg  BP (!) 144/73   Pulse 87   Temp 98.4 F (36.9 C) (Oral)   Resp 19   Ht 4\' 11"  (1.499 m)   Wt 45.5 kg   SpO2 99%   BMI 20.26 kg/m   No distress RRR Soft, NT  Condition at discharge: stable  The results of significant diagnostics from this hospitalization (including imaging, microbiology, ancillary and laboratory) are listed below for reference.   Imaging Studies: ECHOCARDIOGRAM COMPLETE  Result Date: 09/11/2022    ECHOCARDIOGRAM REPORT   Patient Name:   Sydney Gross Date of Exam: 09/11/2022 Medical Rec #:  [id number]         Height:       59.0 in Accession #:    [id number]        Weight:       100.3 lb Date of Birth:  1930-09-15         BSA:          1.376 m Patient Age:    61 years          BP:           132/72  mmHg Patient Gender: F                 HR:           88 bpm. Exam Location:  Inpatient Procedure: 2D Echo, Cardiac Doppler and Color Doppler Indications:    Elevated Troponin  History:        Patient has prior history of Echocardiogram examinations, most                 recent 06/17/2022. CAD, COPD and Pulmonary HTN, Arrythmias:Atrial                 Fibrillation; Risk Factors:Dyslipidemia and Hypertension.  Sonographer:    Bernadene Person RDCS Referring Phys: [id number] [place]  1. Left ventricular ejection fraction, by estimation, is 65 to 70%. The left ventricle has normal function. The left ventricle has no regional wall motion abnormalities. There is mild left ventricular hypertrophy of the basal-septal segment. Left ventricular diastolic function could not be evaluated.  2. Right ventricular systolic function is normal. The right ventricular size is normal. There is mildly elevated pulmonary artery systolic pressure. The estimated right ventricular systolic pressure is 99991111 mmHg.  3. Left atrial size was  moderately dilated.  4. The mitral valve is normal in structure. Mild mitral valve regurgitation. No evidence of mitral stenosis.  5. Tricuspid valve regurgitation is mild to moderate.  6. The aortic valve is tricuspid. There is moderate calcification of the aortic valve. There is moderate thickening of the aortic valve. Aortic valve regurgitation is mild. Mild aortic valve stenosis. Aortic regurgitation PHT measures 584 msec. Aortic valve area, by VTI measures 1.39 cm. Aortic valve mean gradient measures 16.0 mmHg. Aortic valve Vmax measures 2.87 m/s.  7. The inferior vena cava is normal in size with greater than 50% respiratory variability, suggesting right atrial pressure of 3 mmHg.  8. A small pericardial effusion is present. The pericardial effusion is anterior to the right ventricle. There is no evidence of cardiac tamponade. FINDINGS  Left Ventricle: Left ventricular ejection fraction, by estimation, is 65 to 70%. The left ventricle has normal function. The left ventricle has no regional wall motion abnormalities. The left ventricular internal cavity size was normal in size. There is  mild left ventricular hypertrophy of the basal-septal segment. Left ventricular diastolic function could not be evaluated. Right Ventricle: The right ventricular size is normal. No increase in right ventricular wall thickness. Right ventricular systolic function is normal. There is mildly elevated pulmonary artery systolic pressure. The tricuspid regurgitant velocity is 2.96  m/s, and with an assumed right atrial pressure of 3 mmHg, the estimated right ventricular systolic pressure is [id number] mmHg. Left Atrium: Left atrial size was moderately dilated. Right Atrium: Right atrial size was normal in size. Pericardium: A small pericardial effusion is present. The pericardial effusion is anterior to the right ventricle. There is no evidence of cardiac tamponade. Mitral Valve: The mitral valve is normal in structure. Mild mitral valve  regurgitation. No evidence of mitral valve stenosis. Tricuspid Valve: The tricuspid valve is normal in structure. Tricuspid valve regurgitation is mild to moderate. No evidence of tricuspid stenosis. Aortic Valve: The aortic valve is tricuspid. There is moderate calcification of the aortic valve. There is moderate thickening of the aortic valve. Aortic valve regurgitation is mild. Aortic regurgitation PHT measures 584 msec. Mild aortic stenosis is present. Aortic valve mean gradient measures 16.0 mmHg. Aortic valve peak gradient measures 32.9 mmHg. Aortic valve area, by VTI measures  1.39 cm. Pulmonic Valve: The pulmonic valve was normal in structure. Pulmonic valve regurgitation is mild. No evidence of pulmonic stenosis. Aorta: The aortic root is normal in size and structure. Venous: The inferior vena cava is normal in size with greater than 50% respiratory variability, suggesting right atrial pressure of 3 mmHg. IAS/Shunts: No atrial level shunt detected by color flow Doppler.  LEFT VENTRICLE PLAX 2D LVIDd:         3.10 cm LVIDs:         1.60 cm LV PW:         1.10 cm LV IVS:        1.20 cm LVOT diam:     1.80 cm LV SV:         69 LV SV Index:   50 LVOT Area:     2.54 cm  LV Volumes (MOD) LV vol d, MOD A2C: 36.5 ml LV vol d, MOD A4C: 43.5 ml LV vol s, MOD A2C: 14.1 ml LV vol s, MOD A4C: 12.2 ml LV SV MOD A2C:     22.4 ml LV SV MOD A4C:     43.5 ml LV SV MOD BP:      26.1 ml RIGHT VENTRICLE TAPSE (M-mode): 1.4 cm LEFT ATRIUM             Index        RIGHT ATRIUM          Index LA diam:        4.20 cm 3.05 cm/m   RA Area:     8.96 cm LA Vol (A2C):   54.7 ml 39.77 ml/m  RA Volume:   14.50 ml 10.54 ml/m LA Vol (A4C):   60.6 ml 44.06 ml/m LA Biplane Vol: 61.5 ml 44.71 ml/m  AORTIC VALVE AV Area (Vmax):    1.57 cm AV Area (Vmean):   1.61 cm AV Area (VTI):     1.39 cm AV Vmax:           287.00 cm/s AV Vmean:          183.000 cm/s AV VTI:            0.493 m AV Peak Grad:      32.9 mmHg AV Mean Grad:      16.0  mmHg LVOT Vmax:         177.00 cm/s LVOT Vmean:        115.500 cm/s LVOT VTI:          0.270 m LVOT/AV VTI ratio: 0.55 AI PHT:            584 msec  AORTA Ao Root diam: 2.80 cm Ao Asc diam:  2.80 cm TRICUSPID VALVE TR Peak grad:   35.0 mmHg TR Vmax:        296.00 cm/s  SHUNTS Systemic VTI:  0.27 m Systemic Diam: 1.80 cm Fransico Him MD Electronically signed by Fransico Him MD Signature Date/Time: 09/11/2022/10:52:13 AM    Final    CT ANGIO HEAD NECK W WO CM  Result Date: 09/10/2022 CLINICAL DATA:  Initial evaluation for acute dizziness. EXAM: CT ANGIOGRAPHY HEAD AND NECK TECHNIQUE: Multidetector CT imaging of the head and neck was performed using the standard protocol during bolus administration of intravenous contrast. Multiplanar CT image reconstructions and MIPs were obtained to evaluate the vascular anatomy. Carotid stenosis measurements (when applicable) are obtained utilizing NASCET criteria, using the distal internal carotid diameter as the denominator. RADIATION DOSE REDUCTION: This exam was performed  according to the departmental dose-optimization program which includes automated exposure control, adjustment of the mA and/or kV according to patient size and/or use of iterative reconstruction technique. CONTRAST:  39mL OMNIPAQUE IOHEXOL 350 MG/ML SOLN COMPARISON:  Prior study from 06/23/2012. FINDINGS: CT HEAD FINDINGS Brain: Age-related cerebral atrophy with chronic small vessel ischemic disease. No acute intracranial hemorrhage. No acute large vessel territory infarct. 1.5 cm calcified meningioma overlies anterior left frontal convexity without edema or mass effect. No midline shift or hydrocephalus. No extra-axial fluid collection. Vascular: No hyperdense vessel. Scattered vascular calcifications noted within the carotid siphons. Skull: Scalp soft tissues and calvarium within normal limits. Sinuses/Orbits: Globes orbital soft tissues demonstrate no acute finding. Visualized paranasal sinuses and mastoid  air cells are clear. Other: None. Review of the MIP images confirms the above findings CTA NECK FINDINGS Aortic arch: Visualized aortic arch normal caliber with standard branch pattern. Moderate atheromatous change about the arch itself. No high-grade stenosis about the origin the great vessels. Right carotid system: Right common and internal carotid arteries are tortuous without dissection or occlusion. No hemodynamically significant stenosis about the right carotid artery system within the neck. Left carotid system: She left common and internal carotid arteries are tortuous but patent without dissection or occlusion. No hemodynamically significant stenosis about the left carotid artery system within the neck. Vertebral arteries: Both vertebral arteries arise from subclavian arteries. No proximal subclavian artery stenosis. Vertebral arteries are tortuous but widely patent without dissection or stenosis. Skeleton: No discrete or worrisome osseous lesions. Moderate cervical spondylosis with grade 1 anterolisthesis of C2 on C3. Other neck: No other acute soft tissue abnormality within the neck. Upper chest: Visualized upper chest demonstrates no acute finding. Review of the MIP images confirms the above findings CTA HEAD FINDINGS Anterior circulation: Petrous segments patent bilaterally. Mild for age atheromatous change within the carotid siphons without stenosis. Left A1 patent. Right A1 hypoplastic but grossly patent as well, accounting for the diminutive right ICA is compared to the left. Normal anterior communicating artery complex. Tortuosity and atheromatous irregularity throughout the ACAs without proximal high-grade stenosis. No high-grade M1 stenosis or occlusion. No proximal MCA branch occlusion. Diffuse MCA branches well perfused bilaterally, although demonstrate diffuse atheromatous irregularity. Posterior circulation: Intradural V4 segments are tortuous but patent without stenosis. Both PICA patent.  Atheromatous irregularity within the basilar artery with associated mild to moderate stenoses. Superior cerebral arteries patent bilaterally. Both PCAs primarily supplied via the basilar. Atheromatous irregularity throughout both PCAs with associated moderate to severe left P2 stenoses. Both PCAs remain patent to their distal aspects. Venous sinuses: Patent allowing for timing the contrast bolus. Anatomic variants: None significant.  No aneurysm. Review of the MIP images confirms the above findings IMPRESSION: CT HEAD: 1. No acute intracranial abnormality. 2. Age-related cerebral atrophy with chronic small vessel ischemic disease. 3. 1.5 cm calcified meningioma overlying the anterior left frontal convexity without edema or mass effect. CTA HEAD AND NECK: 1. Negative CTA for large vessel occlusion or other emergent finding. 2. Moderate intracranial atherosclerotic disease, with most notable findings including moderate to severe left P2 stenoses. No other proximal high-grade or correctable stenosis. 3. Diffuse tortuosity about the major arterial vasculature of the head and neck, suggesting chronic underlying hypertension. Aortic Atherosclerosis (ICD10-I70.0). Electronically Signed   By: Jeannine Boga M.D.   On: 09/10/2022 20:23   CT ABDOMEN PELVIS W CONTRAST  Result Date: 09/10/2022 CLINICAL DATA:  Abdominal pain, nausea, diarrhea, dizziness EXAM: CT ABDOMEN AND PELVIS WITH CONTRAST TECHNIQUE: Multidetector CT  imaging of the abdomen and pelvis was performed using the standard protocol following bolus administration of intravenous contrast. RADIATION DOSE REDUCTION: This exam was performed according to the departmental dose-optimization program which includes automated exposure control, adjustment of the mA and/or kV according to patient size and/or use of iterative reconstruction technique. CONTRAST:  11mL OMNIPAQUE IOHEXOL 350 MG/ML SOLN COMPARISON:  06/17/2022 FINDINGS: Lower chest: Lung bases are clear.  Hepatobiliary: Liver is within normal limits, noting focal fat/altered perfusion along the falciform ligament. Status post cholecystectomy. No intrahepatic or extrahepatic ductal dilatation. Pancreas: Within normal limits. Spleen: Within normal limits. Adrenals/Urinary Tract: Adrenal glands are within normal limits. 4.6 cm right lower pole renal cyst (series 3/image 24), measuring simple fluid density, benign (Bosniak I). No follow-up is recommended. 7 mm fat density lesion in the posterior left lower kidney (series 3/image 16), reflecting a benign renal angiomyolipoma. No follow-up is recommended. Mild left renal atrophy. No hydronephrosis. Bladder is within normal limits. Stomach/Bowel: Stomach is within normal limits. No evidence of bowel obstruction. Appendix is not discretely visualized, likely surgically absent. Sigmoid diverticulosis, without evidence of diverticulitis. Vascular/Lymphatic: No evidence of abdominal aortic aneurysm. 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 Left common iliac stent. Atherosclerotic calcifications of the abdominal aorta and branch vessels. No suspicious abdominopelvic lymphadenopathy. Reproductive: Status post hysterectomy. No adnexal masses. Other: No abdominopelvic ascites. Musculoskeletal: Degenerative changes of the visualized thoracolumbar spine. Upper lumbar dextroscoliosis. IMPRESSION: Sigmoid diverticulosis, without evidence of diverticulitis. 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 Left common iliac stent. No CT findings to account for the patient's abdominal pain. Electronically Signed   By: Julian Hy M.D.   On: 09/10/2022 20:01   DG Chest Portable 1 View  Result Date: 09/10/2022 CLINICAL DATA:  Addendum abdominal pain. EXAM: PORTABLE CHEST 1 VIEW COMPARISON:  Chest x-ray 08/24/2022 FINDINGS: The heart size and mediastinal contours are within normal limits. Both lungs are clear. There is levoconvex curvature of  the thoracic spine. IMPRESSION: No active disease. Electronically Signed   By: Ronney Asters M.D.   On: 09/10/2022 18:49   US RENAL  Result Date: 08/24/2022 CLINICAL DATA:  Acute renal injury EXAM: RENAL / URINARY TRACT ULTRASOUND COMPLETE COMPARISON:  06/17/2022 CT FINDINGS: Right Kidney: Renal measurements: 9.9 x 4.1 x 5.8 cm. = volume: 125 mL. No mass lesion or hydronephrosis is noted. 4.3 cm cyst is noted arising from the lower pole of the right kidney stable in appearance from the prior CT. Left Kidney: Renal measurements: 8.1 x 3.5 x 4.0 cm. = volume: 60 mL. Echogenicity within normal limits. No mass or hydronephrosis visualized. Bladder: Decompressed Other: None. IMPRESSION: Slightly smaller left kidney. Right renal cyst simple in appearance stable from prior CT. No further follow-up is recommended. Electronically Signed   By: Inez Catalina M.D.   On: 08/24/2022 19:34   CT CHEST WO CONTRAST  Result Date: 08/24/2022 CLINICAL DATA:  Respiratory illness, cough EXAM: CT CHEST WITHOUT CONTRAST TECHNIQUE: Multidetector CT imaging of the chest was performed following the standard protocol without IV contrast. RADIATION DOSE REDUCTION: This exam was performed according to the departmental dose-optimization program which includes automated exposure control, adjustment of the mA and/or kV according to patient size and/or use of iterative reconstruction technique. COMPARISON:  Previous studies including chest radiograph done earlier today FINDINGS: Cardiovascular: Calcifications are seen in thoracic aorta and coronary arteries. There is ectasia of ascending thoracic aorta measuring 3.8 cm. Mediastinum/Nodes: No significant lymphadenopathy is seen in mediastinum. Thyroid is  smaller than usual in size. Lungs/Pleura: Small linear densities are seen in right lower lung field suggesting scarring or subsegmental atelectasis. There is mild ectasia of bronchi in the right lower lung field. There is no focal pulmonary  consolidation. In image 70 of series 5, there is 6 mm faint nodule in left upper lobe. In image 76, small faint ground-glass opacity seen in left upper lobe. In image 68, there is a faint 3 mm nodule in right lower lobe. Upper Abdomen: There is possible small angiomyolipoma in left kidney. Musculoskeletal: No acute findings are seen. IMPRESSION: Small patchy densities are seen in right lower lung fields suggesting scarring or atelectasis or pneumonia. Small faint ground-glass densities are seen in left upper lobe, possibly scarring or interstitial pneumonia. There are few small nodular densities in the lung fields largest measuring 6 mm in size in left upper lobe. Non-contrast chest CT at 3-6 months is recommended. If the nodules are stable at time of repeat CT, then future CT at 18-24 months (from today's scan) is considered optional for low-risk patients, but is recommended for high-risk patients. This recommendation follows the consensus statement: Guidelines for Management of Incidental Pulmonary Nodules Detected on CT Images: From the Fleischner Society 2017; Radiology 2017; 284:228-243. Coronary artery disease. There is ectasia of ascending thoracic aorta measuring 3.8 cm. Recommend annual imaging followup by CTA or MRA. This recommendation follows 2010 ACCF/AHA/AATS/ACR/ASA/SCA/SCAI/SIR/STS/SVM Guidelines for the Diagnosis and Management of Patients with Thoracic Aortic Disease. Circulation. 2010; [id number]. Aortic aneurysm NOS (ICD10-I71.9) Other findings as described in the body of the report. Electronically Signed   By: Elmer Picker M.D.   On: 08/24/2022 18:55   DG Chest Portable 1 View  Result Date: 08/24/2022 CLINICAL DATA:  Shortness of breath and productive cough, COPD EXAM: PORTABLE CHEST 1 VIEW COMPARISON:  08/16/2022 FINDINGS: The heart size and mediastinal contours are unchanged. Aortic atherosclerotic disease. No focal pulmonary opacity. No pleural effusion or pneumothorax.  Scoliosis. No acute osseous abnormality. IMPRESSION: No acute cardiopulmonary process. Electronically Signed   By: Merilyn Baba M.D.   On: 08/24/2022 12:08    Microbiology: Results for orders placed or performed during the hospital encounter of 09/10/22  Resp Panel by RT-PCR (Flu A&B, Covid) Anterior Nasal Swab     Status: None   Collection Time: 09/10/22  6:03 PM   Specimen: Anterior Nasal Swab  Result Value Ref Range Status   SARS Coronavirus 2 by RT PCR NEGATIVE NEGATIVE Final    Comment: (NOTE) SARS-CoV-2 target nucleic acids are NOT DETECTED.  The SARS-CoV-2 RNA is generally detectable in upper respiratory specimens during the acute phase of infection. The lowest concentration of SARS-CoV-2 viral copies this assay can detect is 138 copies/mL. A negative result does not preclude SARS-Cov-2 infection and should not be used as the sole basis for treatment or other patient management decisions. A negative result may occur with  improper specimen collection/handling, submission of specimen other than nasopharyngeal swab, presence of viral mutation(s) within the areas targeted by this assay, and inadequate number of viral copies(<138 copies/mL). A negative result must be combined with clinical observations, patient history, and epidemiological information. The expected result is Negative.  Fact Sheet for Patients:  EntrepreneurPulse.com.au  Fact Sheet for Healthcare Providers:  IncredibleEmployment.be  This test is no t yet approved or cleared by the [place] FDA and  has been authorized for detection and/or diagnosis of SARS-CoV-2 by FDA under an Emergency Use Authorization (EUA). This EUA will remain  in effect (  meaning this test can be used) for the duration of the COVID-19 declaration under Section 564(b)(1) of the Act, 21 U.S.C.section 360bbb-3(b)(1), unless the authorization is terminated  or revoked sooner.       Influenza A by  PCR NEGATIVE NEGATIVE Final   Influenza B by PCR NEGATIVE NEGATIVE Final    Comment: (NOTE) The Xpert Xpress SARS-CoV-2/FLU/RSV plus assay is intended as an aid in the diagnosis of influenza from Nasopharyngeal swab specimens and should not be used as a sole basis for treatment. Nasal washings and aspirates are unacceptable for Xpert Xpress SARS-CoV-2/FLU/RSV testing.  Fact Sheet for Patients: EntrepreneurPulse.com.au  Fact Sheet for Healthcare Providers: IncredibleEmployment.be  This test is not yet approved or cleared by the [place] FDA and has been authorized for detection and/or diagnosis of SARS-CoV-2 by FDA under an Emergency Use Authorization (EUA). This EUA will remain in effect (meaning this test can be used) for the duration of the COVID-19 declaration under Section 564(b)(1) of the Act, 21 U.S.C. section 360bbb-3(b)(1), unless the authorization is terminated or revoked.  Performed at [facility], [address] [place], [place] [address]     Labs: CBC: Recent Labs  Lab 09/12/22 1606 09/12/22 2110 09/13/22 0426 09/14/22 0012 09/15/22 0712  WBC 13.2* 12.4* 10.5 10.1 14.4*  HGB 9.9* 9.8* 8.9* 9.1* 9.9*  HCT 29.9* 30.0* 27.0* 27.3* 30.5*  MCV 103.1* 103.4* 101.9* 100.7* 104.1*  PLT 205 181 179 193 [id number]   Basic Metabolic Panel: Recent Labs  Lab 09/12/22 0156 09/12/22 0405  NA 132* 134*  K 3.9 3.9  CL 106 105  CO2 21* 21*  GLUCOSE 139* 133*  BUN 20 18  CREATININE 0.90 0.87  CALCIUM 6.8* 7.1*   Liver Function Tests: Recent Labs  Lab 09/12/22 0156  AST 15  ALT 13  ALKPHOS 33*  BILITOT 0.4  PROT 3.5*  ALBUMIN 1.9*   CBG: No results for input(s): "GLUCAP" in the last 168 hours.  Discharge time spent: greater than 30 minutes.  Signed: Patrecia Pour, MD [facility] 09/18/2022

## 2022-09-15 NOTE — Progress Notes (Signed)
Mobility Specialist - Progress Note   09/15/22 0911  Mobility  Activity Ambulated with assistance in hallway  Level of Assistance Standby assist, set-up cues, supervision of patient - no hands on  Assistive Device None  Distance Ambulated (ft) 200 ft  Activity Response Tolerated well  Mobility Referral Yes  $Mobility charge 1 Mobility   Pt received in door and agreeable. No complaints throughout. Pt returned to E[facility] with all needs met.  Larey Seat

## 2022-09-16 ENCOUNTER — Encounter (HOSPITAL_COMMUNITY): Payer: Self-pay | Admitting: Gastroenterology

## 2022-09-30 DIAGNOSIS — K259 Gastric ulcer, unspecified as acute or chronic, without hemorrhage or perforation: Secondary | ICD-10-CM | POA: Diagnosis not present

## 2022-09-30 DIAGNOSIS — R634 Abnormal weight loss: Secondary | ICD-10-CM | POA: Diagnosis not present

## 2022-09-30 DIAGNOSIS — K269 Duodenal ulcer, unspecified as acute or chronic, without hemorrhage or perforation: Secondary | ICD-10-CM | POA: Diagnosis not present

## 2022-09-30 DIAGNOSIS — K5730 Diverticulosis of large intestine without perforation or abscess without bleeding: Secondary | ICD-10-CM | POA: Diagnosis not present

## 2022-10-07 LAB — ORG ID BY SEQUENCING RFLX AST

## 2022-10-13 DIAGNOSIS — D6869 Other thrombophilia: Secondary | ICD-10-CM | POA: Diagnosis not present

## 2022-10-13 DIAGNOSIS — J449 Chronic obstructive pulmonary disease, unspecified: Secondary | ICD-10-CM | POA: Diagnosis not present

## 2022-10-13 DIAGNOSIS — K254 Chronic or unspecified gastric ulcer with hemorrhage: Secondary | ICD-10-CM | POA: Diagnosis not present

## 2022-10-13 DIAGNOSIS — I480 Paroxysmal atrial fibrillation: Secondary | ICD-10-CM | POA: Diagnosis not present

## 2022-10-18 LAB — MISC LABCORP TEST (SEND OUT): Labcorp test code: 183802

## 2022-10-19 ENCOUNTER — Encounter: Payer: Self-pay | Admitting: Pulmonary Disease

## 2022-10-19 ENCOUNTER — Ambulatory Visit (INDEPENDENT_AMBULATORY_CARE_PROVIDER_SITE_OTHER): Payer: Medicare Other | Admitting: Pulmonary Disease

## 2022-10-19 VITALS — BP 122/64 | HR 76 | Ht 59.0 in | Wt 105.6 lb

## 2022-10-19 DIAGNOSIS — J4540 Moderate persistent asthma, uncomplicated: Secondary | ICD-10-CM

## 2022-10-19 DIAGNOSIS — R0609 Other forms of dyspnea: Secondary | ICD-10-CM

## 2022-10-19 MED ORDER — IPRATROPIUM-ALBUTEROL 0.5-2.5 (3) MG/3ML IN SOLN: 3.0000 mL | Freq: Four times a day (QID) | RESPIRATORY_TRACT | 2 refills | Status: DC | PRN

## 2022-10-19 NOTE — Patient Instructions (Signed)
Nice to meet you  I think likely the correct pulmonary diagnosis is asthma  Continue Breztri 2 puffs twice a day every day, rinse your mouth out with water after every use  Continue DuoNebs, use as needed for wheeze or shortness of breath or cough  Continue the Mucinex as you are  We will get pulmonary function test to see if COPD is present and make sure not missing other causes of your shortness of breath  I recommend gradual increase in your exercise, walking on flat surfaces for 10 to 15 minutes every day or more if you can tolerate this.  Start with around 10 minutes and increase by 2 minutes every 2 weeks or so.  Return to clinic in 3 months or sooner as needed with Dr. Judeth Horn

## 2022-10-19 NOTE — Progress Notes (Signed)
@Patient  ID: , female    DOB: Dec 02, 1930, 88 y.o.   MRN: [id number]  Chief Complaint  Patient presents with   Consult    Pt is here for consult for possible copd. Pt had CT scan in Septemeber and echocardiogram in October. Pt states that she is on Breztri daily and Duoneb treatment. Pt states that November does help. Pt states that she is experiencing SOB when she is up walking around and seems to be SOB when she over works herself. Pt admits that this has been occurring for about 3 years.     Referring provider: Markus Daft, MD  HPI:   88 y.o. woman whom are seen in consultation for evaluation of dyspnea on exertion.  Most recent discharge summary x4 reviewed.  Patient notes onset of cough back in the summer.  She was hospitalized July 2023.  CT scan at that time demonstrates no emphysema, scattered bronchiectasis right middle lobe and right lower lobe, thickened bronchioles, otherwise clear on my review and interpretation.  She was treated for "COPD exacerbation" with steroids.  Some concern for emphysema given wispy infiltrates felt to be atelectasis on my opinion.  She was given antibiotics.  Her cough gradually improved.  She was hospitalized again 09/2022 with GI bleeding.  Most recent hemoglobin in the nines.  Her cough is much improved.  Nearly gone.  He does have dyspnea exertion.  With inclines or stairs is worse.  She is limited in activity by musculoskeletal pain as well.  No times a day with things are better or worse.  No position make things better or worse.  No seasonal environmental factors she can identify to make things better or worse.  Has been using Breztri 2 puffs twice daily.  She feels like this has helped her symptoms.  Uses albuterol and DuoNebs with improvement albeit temporary.  No other alleviating or exacerbating factors.  Her most recent TTE 09/2019 through showed dilated left atrium, mild MVR, mild AI, mild AS.  PMH: Atrial fibrillation,  MVR, AI, AAS, hyperlipidemia, hypertension, GERD Surgical history: Hysterectomy appendectomy back surgery C-section cataract surgery cholecystectomy Family history: Father with emphysema, mother with CVA Social history: never smoker, lives in [place], lives with husband   Questionaires / Pulmonary Flowsheets:   ACT:      No data to display          MMRC:     No data to display          Epworth:      No data to display          Tests:   FENO:  No results found for: "NITRICOXIDE"  PFT:     No data to display          WALK:      No data to display          Imaging: Personally reviewed and as per EMR discussion of this note No results found.  Lab Results: Personally reviewed CBC     Component Value Date/Time   WBC 14.4 (H) 09/15/2022 0712   RBC 2.93 (L) 09/15/2022 0712   HGB 9.9 (L) 09/15/2022 0712   HGB 10.2 (L) 08/14/2021 0000   HCT 30.5 (L) 09/15/2022 0712   HCT 31.9 (L) 08/14/2021 0000   PLT 216 09/15/2022 0712   PLT 280 08/14/2021 0000   MCV 104.1 (H) 09/15/2022 0712   MCV 87 08/14/2021 0000   MCH 33.8 09/15/2022 0712   MCHC 32.5 09/15/2022  7867   RDW 14.0 09/15/2022 0712   RDW 15.8 (H) 08/14/2021 0000   LYMPHSABS 4.2 (H) 09/10/2022 1803   MONOABS 1.3 (H) 09/10/2022 1803   EOSABS 0.0 09/10/2022 1803   BASOSABS 0.0 09/10/2022 1803    BMET    Component Value Date/Time   NA 134 (L) 09/12/2022 0405   NA 142 08/14/2021 1634   K 3.9 09/12/2022 0405   CL 105 09/12/2022 0405   CO2 21 (L) 09/12/2022 0405   GLUCOSE 133 (H) 09/12/2022 0405   BUN 18 09/12/2022 0405   BUN 19 08/14/2021 1634   CREATININE 0.87 09/12/2022 0405   CALCIUM 7.1 (L) 09/12/2022 0405   GFRNONAA >60 09/12/2022 0405   GFRAA 36 (L) 01/29/2019 2115    BNP    Component Value Date/Time   BNP 70.4 08/16/2022 1637    ProBNP    Component Value Date/Time   PROBNP 645 08/14/2021 1634    Specialty Problems       Pulmonary Problems   Dyspnea on  exertion   COPD (chronic obstructive pulmonary disease) (HCC)   COPD with acute exacerbation (HCC)    Allergies  Allergen Reactions   Brilinta [Ticagrelor] Shortness Of Breath   Codeine Nausea And Vomiting   Darvon [Propoxyphene] Nausea Only   Lipitor [Atorvastatin] Other (See Comments)    MYALGIAS   Rosuvastatin Other (See Comments)    MYALGIAS    Simvastatin Other (See Comments)    MYALGIAS    Tramadol Hcl Nausea Only    nausea (08/2019)    Immunization History  Administered Date(s) Administered   Fluad Quad(high Dose 65+) 09/15/2022   Influenza,inj,Quad PF,6+ Mos 12/11/2013   PFIZER(Purple Top)SARS-COV-2 Vaccination 12/26/2019, 01/13/2020    Past Medical History:  Diagnosis Date   Allergic rhinitis    Arthritis    "maybe a little in my knees and back" (07/17/2018)   Chronic lower back pain    Colitis 12/2013   "never had this before"   COPD (chronic obstructive pulmonary disease) (HCC)    "questionable/Dr. Katrinka Blazing" (07/17/2018)   Coronary artery disease    GI bleed    H/O calcium pyrophosphate deposition disease (CPPD)    Hiatal hernia    Hyperlipidemia    Hypertension    Mild mitral regurgitation    On home oxygen therapy    "I've used it 2 times in 2 yrs" (07/17/2018)   PAD (peripheral artery disease) (HCC)    PAF (paroxysmal atrial fibrillation) (HCC)    Pulmonary hypertension (HCC)    Shingles     Tobacco History: Social History   Tobacco Use  Smoking Status Never   Passive exposure: Past  Smokeless Tobacco Never   Counseling given: Not Answered   Continue to not smoke  Outpatient Encounter Medications as of 10/19/2022  Medication Sig   albuterol (PROVENTIL) (2.5 MG/3ML) 0.083% nebulizer solution Take 3 mLs (2.5 mg total) by nebulization every 2 (two) hours as needed for wheezing or shortness of breath.   albuterol (VENTOLIN HFA) 108 (90 Base) MCG/ACT inhaler Inhale 1 puff into the lungs every 6 (six) hours as needed for shortness of breath.    apixaban (ELIQUIS) 2.5 MG TABS tablet Take 1 tablet (2.5 mg total) by mouth 2 (two) times daily.   apixaban (ELIQUIS) 2.5 MG TABS tablet 5 mg.   Budeson-Glycopyrrol-Formoterol (BREZTRI AEROSPHERE) 160-9-4.8 MCG/ACT AERO Inhale 2 puffs into the lungs 2 (two) times daily as needed (shortness of breath/ COPD).   denosumab (PROLIA) 60 MG/ML SOSY injection Inject  60 mg as directed every 6 (six) months.   diphenhydramine-acetaminophen (TYLENOL PM) 25-500 MG TABS tablet Take 1 tablet by mouth at bedtime.   Evolocumab (REPATHA SURECLICK) 140 MG/ML SOAJ INJECT 1 PEN INTO THE SKIN EVERY 14 DAYS (Patient taking differently: Inject 140 mg into the skin every 14 (fourteen) days.)   ezetimibe (ZETIA) 10 MG tablet Take 1 tablet by mouth once daily (Patient taking differently: Take 10 mg by mouth daily.)   lisinopril-hydrochlorothiazide (ZESTORETIC) 20-25 MG tablet Take 0.5 tablets by mouth in the morning. (Patient taking differently: Take 0.5 tablets by mouth daily.)   Magnesium Cl-Calcium Carbonate (SLOW-MAG PO) Take 1 tablet by mouth 2 (two) times daily.   metoprolol succinate (TOPROL-XL) 25 MG 24 hr tablet Take 1 tablet (25 mg total) by mouth daily. Pt needs to make appt to see provider for further refills - 2nd attempt (Patient taking differently: Take 25 mg by mouth daily.)   metoprolol tartrate (LOPRESSOR) 25 MG tablet Take one half - one tablet (12.5 mg - 25 mg) as needed for Heart Rate 100 or above. (Patient taking differently: Take 12.5 mg by mouth daily as needed (for heart rate >= 100).)   nitroGLYCERIN (NITROSTAT) 0.4 MG SL tablet Place 0.4 mg under the tongue every 5 (five) minutes as needed for chest pain.   sucralfate (CARAFATE) 1 GM/10ML suspension Take 10 mLs (1 g total) by mouth 3 (three) times daily.   [DISCONTINUED] ipratropium-albuterol (DUONEB) 0.5-2.5 (3) MG/3ML SOLN Take 3 mLs by nebulization 3 (three) times daily.   ipratropium-albuterol (DUONEB) 0.5-2.5 (3) MG/3ML SOLN Take 3 mLs by  nebulization every 6 (six) hours as needed.   pantoprazole (PROTONIX) 40 MG tablet Take 1 tablet (40 mg total) by mouth 2 (two) times daily before a meal.   No facility-administered encounter medications on file as of 10/19/2022.     Review of Systems  Review of Systems  No chest pain with exertion.  No orthopnea or PND.  Comprehensive review of systems otherwise negative. Physical Exam  BP 122/64 (BP Location: Left Arm, Patient Position: Sitting, Cuff Size: Normal)   Pulse 76   Ht 4\' 11"  (1.499 m)   Wt 105 lb 9.6 oz (47.9 kg)   SpO2 95%   BMI 21.33 kg/m   Wt Readings from Last 5 Encounters:  10/19/22 105 lb 9.6 oz (47.9 kg)  09/14/22 100 lb 5 oz (45.5 kg)  08/28/22 108 lb 14.5 oz (49.4 kg)  08/16/22 105 lb 11.7 oz (48 kg)  08/05/22 107 lb (48.5 kg)    BMI Readings from Last 5 Encounters:  10/19/22 21.33 kg/m  09/14/22 20.26 kg/m  08/28/22 22.00 kg/m  08/16/22 21.36 kg/m  08/05/22 21.61 kg/m     Physical Exam General: Sitting in chair, appears younger than stated age Eyes: EOMI, no icterus Neck: Supple, no JVP Pulmonary: Clear, normal work of breathing Cardiovascular: warm, no edema Abdomen: Non-distended, bowel sounds present MSK: no synovitis, no joint effusion Neuro: Normal gait, no weakness Psych: Normal mood, full affect  Assessment & Plan:   DOE: Multifactorial related to deconditioning in setting of multiple hospitalizations in 2023, cardiac contributors of MVR, AI, AS with LA dilation with worsening in setting of normal elevation in BP with exertion, asthma. PFT for further evaluation.  Recommend graduated exercise program.  Sounds like MSK issues, leg pain, feet pain are limiting factors.  Asthma: Continue Breztri, PRN bronchodilators. Duonebs refilled today.   Bronchiectasis: Scattered, mild. Suspect sequelae of prior infections. Denies GERD or aspiration  symptoms. Consider early antibiotic therapy is cough worsens.  Cough: Now improved. Suspect  related to possible viral illness or asthma flare earlier in 2023.  Return in about 3 months (around 01/19/2023).   Lanier Clam, MD 10/19/2022   This appointment required 65 minutes of patient care (this includes precharting, chart review, review of results, face-to-face care, etc.).

## 2022-11-01 LAB — RAPID GROWER BROTH SUSCEP.
Amikacin: 4
Ciprofloxacin: 4
Clarithromycin: 16
Clofazimine: 0.25
Doxycycline: >8
Linezolid: 8
Moxifloxacin: >4
Tigecycline: 0.06

## 2022-11-01 LAB — ORG ID BY SEQUENCING RFLX AST

## 2022-11-01 LAB — ACID FAST CULTURE WITH REFLEXED SENSITIVITIES (MYCOBACTERIA): Acid Fast Culture: POSITIVE — AB

## 2022-11-01 LAB — AFB ID BY DNA PROBE

## 2022-11-01 LAB — AFB ORGANISM ID BY DNA PROBE
M avium complex: NEGATIVE
M tuberculosis complex: NEGATIVE

## 2022-11-02 LAB — NOCARDIA SUSCEPTIBILITY BROTH
Ceftriaxone: 64
Ciprofloxacin: 1
Clarithromycin: >16
Doxycycline: >16
Imipenem: 16
Linezolid: 2
Minocycline: 16
Moxifloxacin: 1

## 2022-11-08 ENCOUNTER — Emergency Department (HOSPITAL_BASED_OUTPATIENT_CLINIC_OR_DEPARTMENT_OTHER)
Admission: EM | Admit: 2022-11-08 | Discharge: 2022-11-08 | Disposition: A | Discharge status: Home / Self Care | Payer: Medicare Other | Attending: Emergency Medicine | Admitting: Emergency Medicine

## 2022-11-08 ENCOUNTER — Encounter (HOSPITAL_BASED_OUTPATIENT_CLINIC_OR_DEPARTMENT_OTHER): Payer: Self-pay

## 2022-11-08 ENCOUNTER — Other Ambulatory Visit: Payer: Self-pay

## 2022-11-08 DIAGNOSIS — Z7901 Long term (current) use of anticoagulants: Secondary | ICD-10-CM | POA: Diagnosis not present

## 2022-11-08 DIAGNOSIS — S8991XA Unspecified injury of right lower leg, initial encounter: Secondary | ICD-10-CM | POA: Diagnosis present

## 2022-11-08 DIAGNOSIS — S81811A Laceration without foreign body, right lower leg, initial encounter: Secondary | ICD-10-CM | POA: Diagnosis not present

## 2022-11-08 DIAGNOSIS — Z79899 Other long term (current) drug therapy: Secondary | ICD-10-CM | POA: Insufficient documentation

## 2022-11-08 DIAGNOSIS — J449 Chronic obstructive pulmonary disease, unspecified: Secondary | ICD-10-CM | POA: Diagnosis not present

## 2022-11-08 DIAGNOSIS — Z23 Encounter for immunization: Secondary | ICD-10-CM | POA: Diagnosis not present

## 2022-11-08 DIAGNOSIS — W230XXA Caught, crushed, jammed, or pinched between moving objects, initial encounter: Secondary | ICD-10-CM | POA: Insufficient documentation

## 2022-11-08 DIAGNOSIS — I480 Paroxysmal atrial fibrillation: Secondary | ICD-10-CM | POA: Diagnosis not present

## 2022-11-08 DIAGNOSIS — S81819A Laceration without foreign body, unspecified lower leg, initial encounter: Secondary | ICD-10-CM

## 2022-11-08 MED ORDER — TETANUS-DIPHTH-ACELL PERTUSSIS 5-2.5-18.5 LF-MCG/0.5 IM SUSY
0.5000 mL | PREFILLED_SYRINGE | Freq: Once | INTRAMUSCULAR | Status: AC
  Administered 2022-11-08: 0.5 mL via INTRAMUSCULAR
  Filled 2022-11-08: qty 0.5

## 2022-11-08 NOTE — Discharge Instructions (Signed)
Please return to the ED with any new or worsening signs or symptoms Please keep wound clean and dry utilizing supplies provided. Please read attached guide concerning nonsutured laceration care Please follow-up with your PCP this week for wound check Please do not submerge the wound in water

## 2022-11-08 NOTE — ED Triage Notes (Signed)
Patient here POV from Home.  Endorses Lacerating her Right Lower Anterior Leg against a car door. Bruising and 3 cm Laceration to same. No Fall.   Does take Eliquis. Bleeding controlled. Unknown Tetanus.   NAD noted during Triage. A&Ox4. GCS 15. Ambulatory.

## 2022-11-08 NOTE — ED Provider Notes (Signed)
[facility] EMERGENCY DEPT Provider Note   CSN: [id number] Arrival date & time: 11/08/22  1426     History  Chief Complaint  Patient presents with   Laceration    Sydney Gross is a 88 y.o. female with medical history of arthritis, chronic low back pain, COPD, peripheral arterial disease, paroxysmal A-fib on Eliquis.  Patient presents to ED for evaluation of right lower extremity laceration.  The patient states that prior to arrival she was stepping out of a car when the car door swung back striking her right lower shin.  Patient unsure of last tetanus update.  Patient states she is currently anticoagulated for A-fib.  Patient denies any numbness or tingling, lightheadedness or dizziness.   Laceration      Home Medications Prior to Admission medications   Medication Sig Start Date End Date Taking? Authorizing Provider  albuterol (PROVENTIL) (2.5 MG/3ML) 0.083% nebulizer solution Take 3 mLs (2.5 mg total) by nebulization every 2 (two) hours as needed for wheezing or shortness of breath. 08/17/22   Geradine Girt, DO  albuterol (VENTOLIN HFA) 108 (90 Base) MCG/ACT inhaler Inhale 1 puff into the lungs every 6 (six) hours as needed for shortness of breath. 08/11/22   [provider]  apixaban (ELIQUIS) 2.5 MG TABS tablet Take 1 tablet (2.5 mg total) by mouth 2 (two) times daily. 06/18/22   Margie Billet, PA-C  apixaban (ELIQUIS) 2.5 MG TABS tablet 5 mg.    [provider]  Budeson-Glycopyrrol-Formoterol (BREZTRI AEROSPHERE) 160-9-4.8 MCG/ACT AERO Inhale 2 puffs into the lungs 2 (two) times daily as needed (shortness of breath/ COPD). 11/15/19   [provider]  denosumab (PROLIA) 60 MG/ML SOSY injection Inject 60 mg as directed every 6 (six) months.    [provider]  diphenhydramine-acetaminophen (TYLENOL PM) 25-500 MG TABS tablet Take 1 tablet by mouth at bedtime.    [provider]  Evolocumab (REPATHA SURECLICK) [id number]  MG/ML SOAJ INJECT 1 PEN INTO THE SKIN EVERY 14 DAYS Patient taking differently: Inject 140 mg into the skin every 14 (fourteen) days. 07/19/22   Belva Crome, MD  ezetimibe (ZETIA) 10 MG tablet Take 1 tablet by mouth once daily Patient taking differently: Take 10 mg by mouth daily. 03/24/22   Belva Crome, MD  ipratropium-albuterol (DUONEB) 0.5-2.5 (3) MG/3ML SOLN Take 3 mLs by nebulization every 6 (six) hours as needed. 10/19/22   Hunsucker, Bonna Gains, MD  lisinopril-hydrochlorothiazide (ZESTORETIC) 20-25 MG tablet Take 0.5 tablets by mouth in the morning. Patient taking differently: Take 0.5 tablets by mouth daily. 06/18/22   Margie Billet, PA-C  Magnesium Cl-Calcium Carbonate (SLOW-MAG PO) Take 1 tablet by mouth 2 (two) times daily.    [provider]  metoprolol succinate (TOPROL-XL) 25 MG 24 hr tablet Take 1 tablet (25 mg total) by mouth daily. Pt needs to make appt to see provider for further refills - 2nd attempt Patient taking differently: Take 25 mg by mouth daily. 06/18/22   Margie Billet, PA-C  metoprolol tartrate (LOPRESSOR) 25 MG tablet Take one half - one tablet (12.5 mg - 25 mg) as needed for Heart Rate 100 or above. Patient taking differently: Take 12.5 mg by mouth daily as needed (for heart rate >= 100). 08/05/22   Swinyer, Lanice Schwab, NP  nitroGLYCERIN (NITROSTAT) 0.4 MG SL tablet Place 0.4 mg under the tongue every 5 (five) minutes as needed for chest pain.    [provider]  pantoprazole (PROTONIX) 40 MG  tablet Take 1 tablet (40 mg total) by mouth 2 (two) times daily before a meal. 09/15/22 10/15/22  Tyrone Nine, MD  sucralfate (CARAFATE) 1 GM/10ML suspension Take 10 mLs (1 g total) by mouth 3 (three) times daily. 09/15/22   Tyrone Nine, MD      Allergies    Brilinta [ticagrelor], Codeine, Darvon [propoxyphene], Lipitor [atorvastatin], Rosuvastatin, Simvastatin, and Tramadol hcl    Review of Systems   Review of Systems  Skin:  Positive for  wound.  Neurological:  Negative for dizziness, light-headedness and numbness.  All other systems reviewed and are negative.   Physical Exam Updated Vital Signs BP (!) 173/81 (BP Location: Right Arm)   Pulse 85   Temp 98.3 F (36.8 C) (Oral)   Resp 15   Ht 4\' 11"  (1.499 m)   Wt 47.9 kg   SpO2 99%   BMI 21.33 kg/m  Physical Exam Vitals and nursing note reviewed.  Constitutional:      General: She is not in acute distress.    Appearance: She is well-developed.  HENT:     Head: Normocephalic and atraumatic.  Eyes:     Conjunctiva/sclera: Conjunctivae normal.  Cardiovascular:     Rate and Rhythm: Normal rate and regular rhythm.     Heart sounds: No murmur heard. Pulmonary:     Effort: Pulmonary effort is normal. No respiratory distress.     Breath sounds: Normal breath sounds.  Abdominal:     Palpations: Abdomen is soft.     Tenderness: There is no abdominal tenderness.  Musculoskeletal:        General: No swelling.     Cervical back: Neck supple.  Skin:    General: Skin is warm and dry.     Capillary Refill: Capillary refill takes less than 2 seconds.     Findings: Bruising, ecchymosis and laceration present.     Comments: 3 cm laceration to patient anterior right lower leg  Neurological:     Mental Status: She is alert.  Psychiatric:        Mood and Affect: Mood normal.       ED Results / Procedures / Treatments   Labs (all labs ordered are listed, but only abnormal results are displayed) Labs Reviewed - No data to display  EKG None  Radiology No results found.  Procedures . Laceration Repair  Date/Time: 11/08/2022 6:25 PM  Performed by: 14/03/2022, PA-C Authorized by: Al Decant, PA-C   Consent:    Consent obtained:  Verbal   Consent given by:  Patient   Risks, benefits, and alternatives were discussed: yes     Risks discussed:  Infection, need for additional repair, nerve damage, poor wound healing, poor cosmetic result,  pain, retained foreign body, tendon damage and vascular damage   Alternatives discussed:  No treatment Universal protocol:    Patient identity confirmed:  Verbally with patient and arm band Anesthesia:    Anesthesia method:  None Laceration details:    Location:  Leg   Leg location:  R lower leg   Length (cm):  3 Treatment:    Area cleansed with:  Saline   Amount of cleaning:  Standard   Irrigation solution:  Sterile saline   Irrigation method:  Syringe   Debridement:  Minimal Skin repair:    Repair method:  Steri-Strips   Number of Steri-Strips:  3 Approximation:    Approximation:  Loose Repair type:    Repair type:  Simple Post-procedure details:  Dressing:  Bulky dressing   Procedure completion:  Tolerated well, no immediate complications    Medications Ordered in ED Medications  Tdap (BOOSTRIX) injection 0.5 mL (0.5 mLs Intramuscular Given 11/08/22 1741)    ED Course/ Medical Decision Making/ A&P                           Medical Decision Making Risk Prescription drug management.   88 year old female presents to ED for evaluation.  Please see HPI for further details.  On examination patient has 3 cm laceration to right anterior lower leg.  Bleeding controlled.  Patient unsure of tetanus update last.  Patient wound repaired per procedure note with Steri-Strips.  Patient wound was assessed by my attending Dr. Ashok Cordia who stated that skin was most likely lacking integrity for suture closure. I agree.  Patient tetanus updated.  At this time, the patient will be advised to follow back up with her PCP for recheck of wound.  The patient had her wound thoroughly irrigated, cleansed and wrapped.  The patient had Steri-Strips placed and the wound edges were approximated to the best of my ability.  The patient was provided with supplies to care for her wound at home.  The patient was heavily encouraged to follow-up with her PCP this week on Thursday or Friday for wound  check.  Patient given return precautions and she voiced understanding of these.  The patient all her questions answered to her satisfaction.  The patient is stable for discharge.  Final Clinical Impression(s) / ED Diagnoses Final diagnoses:  Skin tear of lower leg without complication, initial encounter    Rx / DC Orders ED Discharge Orders     None         Azucena Cecil, PA-C 11/08/22 1830    Lajean Saver, MD 11/11/22 1418

## 2022-11-10 DIAGNOSIS — S81811A Laceration without foreign body, right lower leg, initial encounter: Secondary | ICD-10-CM | POA: Diagnosis not present

## 2022-11-11 ENCOUNTER — Ambulatory Visit (INDEPENDENT_AMBULATORY_CARE_PROVIDER_SITE_OTHER): Payer: Medicare Other | Admitting: Pulmonary Disease

## 2022-11-11 DIAGNOSIS — R0609 Other forms of dyspnea: Secondary | ICD-10-CM | POA: Diagnosis not present

## 2022-11-11 LAB — PULMONARY FUNCTION TEST
DL/VA % pred: 87 %
DL/VA: 3.65 ml/min/mmHg/L
DLCO cor % pred: 75 %
DLCO cor: 11.56 ml/min/mmHg
DLCO unc % pred: 75 %
DLCO unc: 11.56 ml/min/mmHg
FEF 25-75 Post: 0.65 L/sec
FEF 25-75 Pre: 0.48 L/sec
FEF2575-%Change-Post: 33 %
FEF2575-%Pred-Post: 107 %
FEF2575-%Pred-Pre: 80 %
FEV1-%Change-Post: 11 %
FEV1-%Pred-Post: 80 %
FEV1-%Pred-Pre: 71 %
FEV1-Post: 0.9 L
FEV1-Pre: 0.8 L
FEV1FVC-%Change-Post: 5 %
FEV1FVC-%Pred-Pre: 91 %
FEV6-%Change-Post: 5 %
FEV6-%Pred-Post: 90 %
FEV6-%Pred-Pre: 85 %
FEV6-Post: 1.3 L
FEV6-Pre: 1.23 L
FEV6FVC-%Pred-Post: 109 %
FEV6FVC-%Pred-Pre: 109 %
FVC-%Change-Post: 5 %
FVC-%Pred-Post: 83 %
FVC-%Pred-Pre: 78 %
FVC-Post: 1.3 L
FVC-Pre: 1.23 L
Post FEV1/FVC ratio: 69 %
Post FEV6/FVC ratio: 100 %
Pre FEV1/FVC ratio: 65 %
Pre FEV6/FVC Ratio: 100 %
RV % pred: 105 %
RV: 2.48 L
TLC % pred: 92 %
TLC: 3.99 L

## 2022-11-11 NOTE — Patient Instructions (Signed)
Full PFT performed today. °

## 2022-11-11 NOTE — Progress Notes (Signed)
Full PFT performed today. °

## 2022-11-16 DIAGNOSIS — L0390 Cellulitis, unspecified: Secondary | ICD-10-CM | POA: Diagnosis not present

## 2022-11-16 DIAGNOSIS — I2510 Atherosclerotic heart disease of native coronary artery without angina pectoris: Secondary | ICD-10-CM | POA: Diagnosis not present

## 2022-11-17 DIAGNOSIS — M549 Dorsalgia, unspecified: Secondary | ICD-10-CM | POA: Diagnosis not present

## 2022-11-17 DIAGNOSIS — L0390 Cellulitis, unspecified: Secondary | ICD-10-CM | POA: Diagnosis not present

## 2022-11-24 ENCOUNTER — Encounter: Payer: Self-pay | Admitting: Student

## 2022-11-24 ENCOUNTER — Ambulatory Visit (INDEPENDENT_AMBULATORY_CARE_PROVIDER_SITE_OTHER): Payer: Medicare Other | Admitting: Student

## 2022-11-24 VITALS — BP 108/69 | HR 94

## 2022-11-24 DIAGNOSIS — I739 Peripheral vascular disease, unspecified: Secondary | ICD-10-CM | POA: Diagnosis not present

## 2022-11-24 DIAGNOSIS — S81801A Unspecified open wound, right lower leg, initial encounter: Secondary | ICD-10-CM | POA: Diagnosis not present

## 2022-11-24 DIAGNOSIS — L0390 Cellulitis, unspecified: Secondary | ICD-10-CM | POA: Diagnosis not present

## 2022-11-24 DIAGNOSIS — T148XXA Other injury of unspecified body region, initial encounter: Secondary | ICD-10-CM | POA: Diagnosis not present

## 2022-11-24 NOTE — Progress Notes (Signed)
Referring Provider Mayra Neer, MD [place] [address] [facility] [address] [place],  [place] [address]   CC:  Chief Complaint  Patient presents with   Follow-up      Sydney Gross is an 88 y.o. female.  HPI: Patient is a 88 year old female with history of a laceration to the right lower leg.  Per chart review, patient went to the emergency room on 11/08/2022 after a car door hit her right lower shin.  Patient also appears to be on Eliquis for A-fib.  Patient is accompanied by her daughter today at bedside.  Today, patient reports she is doing well.  She states that this was initially caused by her getting out of a car that was parked on an incline and the door fell backwards toward her and hit her in the leg.  She reported she went to the emergency room at that time, and since then has been following up with her PCP for wound care.  Patient's daughter states that she has been covering it every day.  Patient's daughter also reports that it was infected at 1 point and the patient was placed on antibiotics, but reports the wound has gotten better but patient still remains on antibiotics.  Patient reports that the area is still slightly tender.  She denies any fevers or chills.  Patient and patient's daughter also reports that the patient has been off of Eliquis for 1 week.  Patient and patient's daughter state that today they went to her PCP, where her PCP evacuated the hematoma in the area and unroofed the dead skin that was above the hematoma.  Patient and patient's daughter state that they were sent here for further wound care management.  Review of Systems General: Denies fevers or chills  Physical Exam    11/24/2022    3:13 PM 11/08/2022    6:40 PM 11/08/2022    5:44 PM  Vitals with BMI  Systolic [id number]  Diastolic 69 81 81  Pulse 94 85 85    General:  No acute distress,  Alert and oriented, Non-Toxic, Normal speech and affect Chaperone present on exam.  On exam, patient is sitting  upright in no acute distress.  Open wound noted to the right lower extremity measuring approximately 6 cm x 4 cm x 1 cm.  There appears to be some eschar versus old blood scattered throughout the wound, especially noted at the inferior aspect of the wound.  There appears to be a little bit of surrounding irritation.  There is no malodor.  There is no purulent drainage noted.  There is some mild tenderness to palpation surrounding the area.  It does not appear to be infected on exam.  Assessment/Plan  Wound of right lower extremity, initial encounter   I discussed with the patient and the patient's daughter that there are several different options potentially for moving forward with treating the patient's wound.  I discussed that we could use different wound care products in which regular wound care would be required.  I also discussed the possibility of surgery, including something such as debridement and skin substitute placement.  I did discuss with the patient and the patient's daughter though that they we will have to meet with Dr. Marla Roe to discuss the possibility of surgery.  Patient and patient's daughter expressed understanding.  I discussed with the patient and the patient's daughter that at this time, we will apply Xeroform, gauze or ABD pad, Curlex and Ace wrap to the  wound daily.  I discussed with him that we will have him follow-up in the clinic next week for reevaluation and to possibly meet Dr. Ulice Bold to discuss further treatment options.  Patient and patient's daughter were in agreement with this plan.  [facility] order will be placed for wound care supplies.  All of the patient and the patient's daughter questions were answered to their satisfaction.  Patient to follow-up next week.  I instructed the patient and patient's daughter to call if they have any questions or concerns.  I will discuss the patient's case with Dr. Ulice Bold.  Pictures were obtained of the patient and  placed in the chart with the patient's or guardian's permission.   Laurena Spies 11/24/2022, 4:33 PM

## 2022-11-25 ENCOUNTER — Telehealth: Payer: Self-pay | Admitting: Plastic Surgery

## 2022-11-25 NOTE — Telephone Encounter (Signed)
Spoke with spouse and notified him of appt for Friday, November 26, 2022 at 9:30 with Dr. Ulice Bold and he said they would be here.

## 2022-11-26 ENCOUNTER — Encounter: Payer: Self-pay | Admitting: Plastic Surgery

## 2022-11-26 ENCOUNTER — Ambulatory Visit (INDEPENDENT_AMBULATORY_CARE_PROVIDER_SITE_OTHER): Payer: Medicare Other | Admitting: Plastic Surgery

## 2022-11-26 VITALS — BP 121/70 | HR 96

## 2022-11-26 DIAGNOSIS — T148XXA Other injury of unspecified body region, initial encounter: Secondary | ICD-10-CM | POA: Insufficient documentation

## 2022-11-26 DIAGNOSIS — Z7901 Long term (current) use of anticoagulants: Secondary | ICD-10-CM

## 2022-11-26 DIAGNOSIS — S81801A Unspecified open wound, right lower leg, initial encounter: Secondary | ICD-10-CM

## 2022-11-26 NOTE — Progress Notes (Signed)
The patient is a lovely 88 year old lady here with her daughter for evaluation of her right leg.  She accidentally hit her right leg getting into her car.  She got a hematoma likely related to her being on blood thinner.  She was seen by Dr. Brigitte Pulse and the blood thinner was held.  The hematoma has been drained.  There does not appear to be infection.  I used some the Vashe to clean the wound and then applied donated myriad powder and a sheet.  I have instructed the daughter to do [place] dressing changes daily and come and see [name] next week.  We will also see if we can get endoform sent to her house.  Pictures were obtained of the patient and placed in the chart with the patient's or guardian's permission.

## 2022-11-30 ENCOUNTER — Telehealth: Payer: Self-pay | Admitting: *Deleted

## 2022-11-30 ENCOUNTER — Telehealth: Payer: Self-pay | Admitting: Plastic Surgery

## 2022-11-30 ENCOUNTER — Ambulatory Visit: Payer: Medicare Other | Admitting: Physician Assistant

## 2022-11-30 NOTE — Telephone Encounter (Signed)
She is scheduled for a visit today isnt she?

## 2022-11-30 NOTE — Telephone Encounter (Signed)
Jeff spoke with patient. 

## 2022-11-30 NOTE — Telephone Encounter (Signed)
Pt daughter is calling and wanting to know if her mom is supposed to be on an antibotic. She would like a call back when possible

## 2022-11-30 NOTE — Telephone Encounter (Signed)
Received on (11/26/2022) via of fax Order Status Notification from [facility] St[place]:[facility] has provided service for the patient; Please provide medical record documentation for the most recent visit dates.  Please ensure these are legibly or electronically signed and dated by the ordering practitioner.  Recent visit notes faxed to [facility].  Confirmation received and copy scanned into the chart.//AB/CMA

## 2022-12-02 ENCOUNTER — Telehealth: Payer: Self-pay

## 2022-12-02 ENCOUNTER — Ambulatory Visit: Payer: Medicare Other | Admitting: Student

## 2022-12-02 DIAGNOSIS — S81801A Unspecified open wound, right lower leg, initial encounter: Secondary | ICD-10-CM

## 2022-12-02 DIAGNOSIS — T148XXA Other injury of unspecified body region, initial encounter: Secondary | ICD-10-CM

## 2022-12-02 NOTE — Progress Notes (Signed)
   Referring Provider Mayra Neer, MD [place] [address] [facility] [address] [place],  [place] [address]   CC:  Chief Complaint  Patient presents with   Follow-up      Sydney Gross is an 88 y.o. female.  HPI: Patient is a 88 year old female with history of a right lower extremity hematoma which was then drained.  There is now a wound to her right lower extremity.  Patient presents to the clinic today for further follow-up on her wound.  Patient was last seen in the clinic on 11/26/2022.  At this visit, the wound did not appear to be infected.  Vashe was used to clean the wound and then donated myriad powder and a sheet were applied to the wound.  Patient was to do [place] dressing changes daily and come back to the clinic for reevaluation the following week.  Patient is accompanied by her daughter at bedside today.  Today, patient reports she is doing well.  She has no new complaints or concerns regarding her wound.  Patient's daughter states that she has been applying K-Y jelly daily to the wound.  Patient's daughter states that she has been doing the wound care and plans to continue to do the wound care.  She states that the patient does not have home health.  Review of Systems General: Denies fevers or chills  Physical Exam    11/26/2022    9:28 AM 11/24/2022    3:13 PM 11/08/2022    6:40 PM  Vitals with BMI  Systolic 259 563 875  Diastolic 70 69 81  Pulse 96 94 85    General:  No acute distress,  Alert and oriented, Non-Toxic, Normal speech and affect Right lower extremity: There is an approximately 6 cm x 4 cm x 0.5 cm wound to the right lower extremity.  There appears to be some myriad noted throughout the wound bed that has not completely incorporated yet.  There is some healthy appearing tissue throughout the periphery of the wound.  There is no surrounding erythema to the wound.  There is no purulent drainage or malodor.  There are no signs of infection on exam.  Donated endoform  (LOT: [id number], EXP: 04/30/252) was placed on the wound here in the clinic today.  Adaptic and K-Y jelly were placed over the wound, followed by Curlex and Ace wrap.  Assessment/Plan  Hematoma   I discussed with the patient and the patient's daughter that they should apply endoform 2 times per week, followed by Adaptic, K-Y jelly, Curlex and Ace wrap.  I discussed with the patient and the patient's daughter that they should apply K-Y jelly over the Adaptic daily.  Patient and patient's daughter expressed understanding.  [facility] order will be placed for patient supplies.  Patient to follow-up next week.  I instructed the patient and the patient's daughter to call if they have any questions or concerns.  Pictures were obtained of the patient and placed in the chart with the patient's or guardian's permission.    Clance Boll 12/03/2022, 6:24 AM

## 2022-12-02 NOTE — Telephone Encounter (Signed)
Faxed order with demographics, insurance card and ov note on 11/26/22; received confirmation success; forwarded order to front desk x batch scanning.

## 2022-12-03 DIAGNOSIS — S81801A Unspecified open wound, right lower leg, initial encounter: Secondary | ICD-10-CM | POA: Diagnosis not present

## 2022-12-07 ENCOUNTER — Telehealth: Payer: Self-pay

## 2022-12-07 NOTE — Telephone Encounter (Signed)
Received fax from [facility] stating service has been provided for the patient.

## 2022-12-08 ENCOUNTER — Telehealth: Payer: Self-pay | Admitting: Plastic Surgery

## 2022-12-08 NOTE — Telephone Encounter (Signed)
We will see her in clinic tomorrow to assess her at that time

## 2022-12-08 NOTE — Telephone Encounter (Signed)
Please call daughter Carmel [place], she is changing bandage and the endoform does not look like what we used.  She feels it looks worse today than before.  Her appt is tomorrow but would like a call back asap.

## 2022-12-08 NOTE — Telephone Encounter (Signed)
I called and spoke with the patient.  Patient states that when her daughter went to change the dressing, she stated that the wound had some "yellow stuff" within the wound.  She denies any other issues or concerns.  She states that it is not painful.  She denies any fevers or chills.  I called and spoke with the patient's daughter.  Patient's daughter states that she went to change the endoform yesterday, and instead applied a little bit of K-Y jelly and gauze.  She states that she went to take it off today, and it stuck a little bit to the wound.  She states she was able to get it off.  She states today she moisten the endoform and placed on the wound.  She states she placed Adaptic and K-Y jelly and gauze over the top of it.  I discussed with the patient's daughter to continue to apply K-Y jelly daily over the Adaptic and we will evaluate the patient tomorrow in clinic.  Patient's daughter was in agreement with this plan.

## 2022-12-09 ENCOUNTER — Ambulatory Visit: Payer: Medicare Other | Admitting: Physician Assistant

## 2022-12-09 DIAGNOSIS — S81801D Unspecified open wound, right lower leg, subsequent encounter: Secondary | ICD-10-CM | POA: Diagnosis not present

## 2022-12-09 NOTE — Progress Notes (Signed)
Referring Provider Mayra Neer, MD [place] [facility] [place],  [place] [address]   CC:  Chief Complaint  Patient presents with   Follow-up      Sydney Gross is an 89 y.o. female.  HPI: Patient is a very pleasant 89 year old female with PMH of injury to right lower leg while on Eliquis 11/08/2022 resulting in hematoma s/p drainage performed by her primary care provider, Dr. Manuella Ghazi.  She had been referred to our clinic for ongoing wound management.  She was seen here in clinic on 11/26/2022 and donated myriad was placed.  She was then seen again most recently 12/02/2022 and plan was for endoform twice weekly with daily Adaptic/KY dressing changes.  Orders were placed with [facility].  Today, patient is accompanied by her daughter at bedside.  She states that she recently received the endoform from [facility], but did not receive the Adaptic and K-Y jelly.  This is likely because she had been previously prescribed Xeroform and was informed that they are not approved for additional nonadherent dressings for another few weeks.  They applied their first endoform dressing yesterday followed by KY, gauze, and Kerlix secured with Ace wrap.  Patient reports that she is having some tenderness and there was some minor malodor yesterday, but denies any other complaints.  No difficulty ambulating.   Allergies  Allergen Reactions   Brilinta [Ticagrelor] Shortness Of Breath   Codeine Nausea And Vomiting   Darvon [Propoxyphene] Nausea Only   Lipitor [Atorvastatin] Other (See Comments)    MYALGIAS   Rosuvastatin Other (See Comments)    MYALGIAS    Simvastatin Other (See Comments)    MYALGIAS    Tramadol Hcl Nausea Only    nausea (08/2019)    Outpatient Encounter Medications as of 12/09/2022  Medication Sig   albuterol (PROVENTIL) (2.5 MG/3ML) 0.083% nebulizer solution Take 3 mLs (2.5 mg total) by nebulization every 2 (two) hours as needed for wheezing or shortness of breath.   albuterol  (VENTOLIN HFA) 108 (90 Base) MCG/ACT inhaler Inhale 1 puff into the lungs every 6 (six) hours as needed for shortness of breath.   apixaban (ELIQUIS) 2.5 MG TABS tablet Take 1 tablet (2.5 mg total) by mouth 2 (two) times daily.   Budeson-Glycopyrrol-Formoterol (BREZTRI AEROSPHERE) 160-9-4.8 MCG/ACT AERO Inhale 2 puffs into the lungs 2 (two) times daily as needed (shortness of breath/ COPD).   denosumab (PROLIA) 60 MG/ML SOSY injection Inject 60 mg as directed every 6 (six) months.   diphenhydramine-acetaminophen (TYLENOL PM) 25-500 MG TABS tablet Take 1 tablet by mouth at bedtime.   doxycycline (VIBRAMYCIN) 100 MG capsule Take 100 mg by mouth 2 (two) times daily.   Evolocumab (REPATHA SURECLICK) 245 MG/ML SOAJ INJECT 1 PEN INTO THE SKIN EVERY 14 DAYS (Patient taking differently: Inject 140 mg into the skin every 14 (fourteen) days.)   ezetimibe (ZETIA) 10 MG tablet Take 1 tablet by mouth once daily (Patient taking differently: Take 10 mg by mouth daily.)   ipratropium-albuterol (DUONEB) 0.5-2.5 (3) MG/3ML SOLN Take 3 mLs by nebulization every 6 (six) hours as needed.   lisinopril-hydrochlorothiazide (ZESTORETIC) 20-25 MG tablet Take 0.5 tablets by mouth in the morning. (Patient taking differently: Take 0.5 tablets by mouth daily.)   Magnesium Cl-Calcium Carbonate (SLOW-MAG PO) Take 1 tablet by mouth 2 (two) times daily.   metoprolol succinate (TOPROL-XL) 25 MG 24 hr tablet Take 1 tablet (25 mg total) by mouth daily. Pt needs to make appt to see provider for further  refills - 2nd attempt (Patient taking differently: Take 25 mg by mouth daily.)   metoprolol tartrate (LOPRESSOR) 25 MG tablet Take one half - one tablet (12.5 mg - 25 mg) as needed for Heart Rate 100 or above. (Patient taking differently: Take 12.5 mg by mouth daily as needed (for heart rate >= 100).)   nitroGLYCERIN (NITROSTAT) 0.4 MG SL tablet Place 0.4 mg under the tongue every 5 (five) minutes as needed for chest pain.   pantoprazole  (PROTONIX) 40 MG tablet Take 1 tablet (40 mg total) by mouth 2 (two) times daily before a meal.   sucralfate (CARAFATE) 1 GM/10ML suspension Take 10 mLs (1 g total) by mouth 3 (three) times daily.   tiZANidine (ZANAFLEX) 2 MG tablet Take 2 mg by mouth at bedtime.   No facility-administered encounter medications on file as of 12/09/2022.     Past Medical History:  Diagnosis Date   Allergic rhinitis    Arthritis    "maybe a little in my knees and back" (07/17/2018)   Chronic lower back pain    Colitis 12/2013   "never had this before"   COPD (chronic obstructive pulmonary disease) ([facility])    "questionable/Dr. Tamala Julian" (07/17/2018)   Coronary artery disease    GI bleed    H/O calcium pyrophosphate deposition disease (CPPD)    Hiatal hernia    Hyperlipidemia    Hypertension    Mild mitral regurgitation    On home oxygen therapy    "I've used it 2 times in 2 yrs" (07/17/2018)   PAD (peripheral artery disease) ([facility])    PAF (paroxysmal atrial fibrillation) ([facility])    Pulmonary hypertension ([facility])    Shingles     Past Surgical History:  Procedure Laterality Date   ABDOMINAL AORTOGRAM W/LOWER EXTREMITY N/A 07/10/2021   Procedure: ABDOMINAL AORTOGRAM W/LOWER EXTREMITY;  Surgeon: Elam Dutch, MD;  Location: [facility];  Service: Cardiovascular;  Laterality: N/A;   ABDOMINAL HYSTERECTOMY  1960's   APPENDECTOMY     BACK SURGERY     BIOPSY  04/20/2021   Procedure: BIOPSY;  Surgeon: Carol Ada, MD;  Location: [facility];  Service: Endoscopy;;   BIOPSY  09/14/2022   Procedure: BIOPSY;  Surgeon: Carol Ada, MD;  Location: [facility];  Service: Gastroenterology;;   BLEPHAROPLASTY Bilateral 05/2017   CARDIAC CATHETERIZATION     CATARACT EXTRACTION, BILATERAL Bilateral    CESAREAN SECTION  1957; 1960   CORONARY STENT INTERVENTION N/A 07/17/2018   Procedure: CORONARY STENT INTERVENTION;  Surgeon: Belva Crome, MD;  Location: [facility];  Service: Cardiovascular;   Laterality: N/A;   DACROCYSTORHINOSTOMY W/ JONES TUBE Bilateral 05/2017   "put tear duct in my eyes"   ESOPHAGOGASTRODUODENOSCOPY (EGD) WITH PROPOFOL N/A 04/20/2021   Procedure: ESOPHAGOGASTRODUODENOSCOPY (EGD) WITH PROPOFOL;  Surgeon: Carol Ada, MD;  Location: [facility];  Service: Endoscopy;  Laterality: N/A;   ESOPHAGOGASTRODUODENOSCOPY (EGD) WITH PROPOFOL N/A 09/14/2022   Procedure: ESOPHAGOGASTRODUODENOSCOPY (EGD) WITH PROPOFOL;  Surgeon: Carol Ada, MD;  Location: [facility];  Service: Gastroenterology;  Laterality: N/A;  CAD, COPD, PAF   EYE SURGERY     LAPAROSCOPIC CHOLECYSTECTOMY  ~ 2009   LUMBAR [place] SURGERY  ?2004   PERIPHERAL VASCULAR INTERVENTION Left 07/10/2021   Procedure: PERIPHERAL VASCULAR INTERVENTION;  Surgeon: Elam Dutch, MD;  Location: [facility];  Service: Cardiovascular;  Laterality: Left;  common, external iliac arteries   RIGHT/LEFT HEART CATH AND CORONARY ANGIOGRAPHY N/A 07/17/2018   Procedure: RIGHT/LEFT HEART  CATH AND CORONARY ANGIOGRAPHY;  Surgeon: Belva Crome, MD;  Location: [facility];  Service: Cardiovascular;  Laterality: N/A;   TONSILLECTOMY AND ADENOIDECTOMY  ?12's    Family History  Problem Relation Age of Onset   Emphysema Father 34   CVA Mother    Cancer Brother        ? type    Social History   Social History Narrative   Not on file     Review of Systems General: Denies fevers or chills Skin: Endorses mild malodor, denies redness or swelling.  Physical Exam    11/26/2022    9:28 AM 11/24/2022    3:13 PM 11/08/2022    6:40 PM  Vitals with BMI  Systolic [phone]  Diastolic 70 69 81  Pulse 96 94 85    General:  No acute distress, nontoxic appearing  Respiratory: No increased work of breathing Neuro: Alert and oriented Psychiatric: Normal mood and affect  MSK: Right lower leg wound measuring 5 x 4 cm, yellow slough/unincorporated endoform.  Cannot yet visualize the underlying granulation.  Some  unincorporated endoform around periphery of wound.  No surrounding erythema or swelling.  No malodor or concerning drainage.  Appears mildly desiccated.  Assessment/Plan  Right lower extremity wound: The wound did appear mildly dry on examination today with some unincorporated endoform around periphery of wound and some currently incorporating endoform over central aspect.  There had been some residual unincorporated myriad visualized at last encounter with photos obtained 12/02/2022.  Emphasized the importance of good moisture balance. Recommending Adaptic dressing changed every other day followed by K-Y jelly twice daily.  This can be padded with 4 x 4 gauze and secured with Kerlix followed by Ace wrap.  Endoform can be applied again in 1 week and then resume twice weekly application if incorporating quickly enough.  This can be reevaluated at subsequent encounter in 1 week.  No evidence concerning for infection on exam.  Patient and daughter are provided with the contact information for [facility] in order to obtain the Adaptic.  They understand and are agreeable to paying out-of-pocket if necessary.  They understand that given the location of this wound, delayed healing is not uncommon.  They will call clinic should they have any questions or concerns in the interim.  Picture(s) obtained of the patient and placed in the chart were with the patient's or guardian's permission.  Krista Blue 12/09/2022, 11:48 AM

## 2022-12-17 ENCOUNTER — Encounter: Payer: Self-pay | Admitting: Student

## 2022-12-17 ENCOUNTER — Telehealth: Payer: Self-pay | Admitting: *Deleted

## 2022-12-17 ENCOUNTER — Ambulatory Visit: Payer: Medicare Other | Admitting: Student

## 2022-12-17 VITALS — BP 132/79 | HR 89

## 2022-12-17 DIAGNOSIS — S81801D Unspecified open wound, right lower leg, subsequent encounter: Secondary | ICD-10-CM | POA: Diagnosis not present

## 2022-12-17 DIAGNOSIS — Z7901 Long term (current) use of anticoagulants: Secondary | ICD-10-CM

## 2022-12-17 NOTE — Telephone Encounter (Signed)
Call received from [name]'s daughter, Sydney Gross, in response to message from [employer], [place]. Advised her she is currently in the OR and I would send her the message

## 2022-12-17 NOTE — Telephone Encounter (Signed)
I spoke with the patient's daughter. I discussed patient's wound care regimen with her. She expressed understanding and all of her questions were answered.

## 2022-12-17 NOTE — Progress Notes (Signed)
Referring Provider Mayra Neer, MD [facility] [place],  [place] [address]   CC:  Chief Complaint  Patient presents with   Follow-up      Sydney Gross is an 89 y.o. female.  HPI: Patient is a 89 year old female with history of injury to her right lower leg while on Eliquis on 11/08/2022 resulting in a hematoma.  Patient's primary care provider, Dr. Brigitte Pulse, eventually drained the hematoma.  Patient has been following up with our clinic for ongoing wound management.  Patient was last seen on 12/09/2022.  At this visit, patient reported she was having a little bit of tenderness and there was some minor malodor to the wound.  On exam, the right lower extremity wound was measuring approximately 5 x 4 cm.  There was some yellow slough/unincorporated endoform noted to the wound bed.  There was also some unincorporated endoform around the periphery of the wound.  The wound appeared to be mildly desiccated.  Plan was for patient to apply Adaptic dressing changed every other day followed by K-Y jelly twice daily.  Plan was for endoform to be applied again in 1 week and then patient could resume twice weekly application if incorporating quickly enough.  There is no evidence concerning for infection on exam.  Patient is accompanied by her friend at bedside.  Today, patient reports she is doing well.  She reports there is a little bit of soreness near the wound, but has no other complaints or concerns.  She denies any fevers or chills.  Review of Systems General: Denies any fevers or chills  Physical Exam    12/17/2022   11:47 AM 11/26/2022    9:28 AM 11/24/2022    3:13 PM  Vitals with BMI  Systolic [phone]  Diastolic 79 70 69  Pulse 89 96 94    General:  No acute distress,  Alert and oriented, Non-Toxic, Normal speech and affect Chaperone present on exam.  On exam, patient is sitting upright in no acute distress.  Wound to the right lower extremity is approximately 5 cm  x 3 cm x 0.25 cm.  There is granulation tissue noted to the medial aspect of the wound.  There is otherwise exudate noted to the more lateral aspect.  There is no surrounding erythema.  There are no signs of infection on exam.  Donated Medihoney hydrogel sheet was applied to the wound.  Assessment/Plan  Wound of right lower extremity, subsequent encounter   I discussed with the patient that she can leave the Medihoney hydrogel sheet in place for the next 2 to 3 days.  I discussed with the patient that we will plan to apply collagen sheets once the Medihoney is removed. I discussed with the patient that she may apply the collagen to the wound, apply adaptic, a small amount of KY jelly, gauze and wrap with kerlix and ACE wrap. I discussed with the patient she may apply collagen daily or every other day depending on if it is incorporated. Patient may change adaptic every 2 days. Patient expressed understanding.  I will place a [facility] order for collagen sheets.  I discussed with the patient to continue to monitor the area.  I discussed that if it becomes more painful, if there is redness, swelling, increased drainage, or if she develops any fevers or chills to let us know.  Patient expressed understanding.  Patient to follow-up next week.  Pictures were obtained of the patient and placed in  the chart with the patient's or guardian's permission.  Dr. Marla Roe also had the opportunity to examine the patient and agrees with the plan.  I also called the patient's daughter and explained the wound care instructions to her. All of her questions were answered to her satisfaction.   Clance Boll 12/17/2022, 12:21 PM

## 2022-12-20 ENCOUNTER — Telehealth: Payer: Self-pay | Admitting: Physician Assistant

## 2022-12-20 ENCOUNTER — Telehealth: Payer: Self-pay

## 2022-12-20 ENCOUNTER — Telehealth: Payer: Self-pay | Admitting: *Deleted

## 2022-12-20 NOTE — Telephone Encounter (Signed)
Sydney Gross called today noting that she was having some pain at her wound site.  She notes that she had Medihoney placed on her wound at her last office visit approximately 3 days ago.  She notes she is still waiting for her collagen sheets to arrive.  She denies any surrounding redness fever or swelling.  I advised that it is okay for her to take the dressing off, she can use a Adaptic dressing for the next several days until she follows up in our clinic.  I did advise her to reach out to [name] tomorrow morning if she would like to be seen sooner than Friday.  She agreed to this, she had no further questions or concerns.

## 2022-12-20 NOTE — Telephone Encounter (Addendum)
Faxed order to [facility] on (11/25/22) for supplies for the patient.  Confirmation received and copy scanned into the chart.//AB/CMA

## 2022-12-20 NOTE — Telephone Encounter (Signed)
Received on (11/26/2022) via of fax Order Status Notification from [facility].    Stating:[facility] has provided service for the patient;  Please provide medical record documentation for the most recent visit dates.  Please ensure these are legibly or electronically signed and dated by the ordering practitioner.  Most recent office notes were faxed to [facility].  Copy scanned into the chart.//AB/CMA

## 2022-12-20 NOTE — Telephone Encounter (Signed)
Faxed wound supply order to [facility] with demographics, insurance card and ov note; received confirmation success.  

## 2022-12-20 NOTE — Telephone Encounter (Addendum)
Received on (11/30/2022) via of fax Order Status Notification from [facility].  Stating:Patient has reached the maximum allowable as outlined by their health plan.  The patient will be eligible to receive additional supplies on 12/26/2022.  [facility] will be contacting the patient with pricing options and advise them of their eligible date for reordering.  Thank you for choosing [facility]!//AB/CMA

## 2022-12-21 ENCOUNTER — Telehealth: Payer: Self-pay | Admitting: *Deleted

## 2022-12-21 ENCOUNTER — Telehealth: Payer: Self-pay | Admitting: Student

## 2022-12-21 ENCOUNTER — Telehealth: Payer: Self-pay

## 2022-12-21 NOTE — Telephone Encounter (Signed)
Received fax from [facility] regarding patient's wound care supply request: The patient's insurance is showing inactive, [facility] will reach out to the patient to offer discounted pricing or updated Insurance information.

## 2022-12-21 NOTE — Telephone Encounter (Signed)
I called the patient to inform her that we received a form from [facility] that states that she cannot receive any additional supplies until 12/26/2022 due to insurance reasons.  I discussed with the patient that she can apply endoform to her lower leg wound, Adaptic, a small amount of K-Y jelly, gauze, Curlex and Ace wrap.  I discussed with the patient that the endoform should incorporate into the tissue to the wound.  I discussed with her that if it is not incorporated, she can change her dressings the following day when if the endoform is more incorporated.  Patient expressed understanding.  I discussed with the patient to call us in the meantime if she has any questions or concerns regarding her wound care.  I discussed with her that if she feels like she needs to be seen in the clinic before Friday we can see her.  Patient expressed understanding and states she will call if needed.  I also attempted to call the patient's daughter to discuss the wound care with her as well.  Patient's daughter did not answer and I did leave a voicemail to return call.

## 2022-12-21 NOTE — Telephone Encounter (Signed)
Voicemail retrieved from clinical line- pt's daughter Eduard Clos returning call. Message sent to Donnamarie Rossetti, PA-C

## 2022-12-22 ENCOUNTER — Encounter: Payer: Self-pay | Admitting: Student

## 2022-12-22 ENCOUNTER — Ambulatory Visit (INDEPENDENT_AMBULATORY_CARE_PROVIDER_SITE_OTHER): Payer: Medicare Other | Admitting: Student

## 2022-12-22 VITALS — BP 137/81 | HR 107 | Temp 98.2°F

## 2022-12-22 DIAGNOSIS — S81801D Unspecified open wound, right lower leg, subsequent encounter: Secondary | ICD-10-CM | POA: Diagnosis not present

## 2022-12-22 DIAGNOSIS — S81801A Unspecified open wound, right lower leg, initial encounter: Secondary | ICD-10-CM | POA: Diagnosis not present

## 2022-12-22 MED ORDER — DOXYCYCLINE HYCLATE 100 MG PO TABS: 100.0000 mg | ORAL_TABLET | Freq: Two times a day (BID) | ORAL | 0 refills | Status: AC

## 2022-12-22 NOTE — Telephone Encounter (Signed)
Spoke to [facility] [employer] [facility] at [facility] to confirm if the order was received and she advised:   Yes, we did. Unfortunately, the insurance was not updated in the file so it was denied for inactive insurance. I have pulled the correct insurance and sent the order to ship today. I apologize for the inconvenience.

## 2022-12-22 NOTE — Addendum Note (Signed)
Addended by: Donnamarie Rossetti on: 12/22/2022 03:33 PM   Modules accepted: Orders

## 2022-12-22 NOTE — Progress Notes (Signed)
Referring Provider Mayra Neer, MD [place] [facility] [place],  [place] [address]   CC:  Chief Complaint  Patient presents with   Follow-up      Sydney Gross is an 89 y.o. female.  HPI: Patient is a 89 year old female with history of injury to her right lower leg while on Eliquis on 11/08/2022 resulting in a hematoma.  Patient's primary care provider, Dr. Manuella Ghazi, eventually drained the hematoma.  Patient has been following up in our clinic for ongoing wound management.  Patient was last seen in the clinic on 12/17/2022.  At this visit, patient reported she was doing well.  On exam, the wound was approximately 5 cm x 3 cm x 0.25 cm.  There is granulation tissue noted to the medial aspect of the wound.  Some exudate noted to the more lateral aspect of the wound.  There were no signs of infection on exam.  Donated Medihoney hydrogel sheet was applied to the wound.  Plan was for patient to leave the Medihoney hydrogel sheet in place for approximately 2 to 3 days, and then apply collagen sheets with Adaptic, small amount of K-Y jelly, gauze and then wrapped with Kerlix and Ace wrap.  Plan was for patient to follow-up in 1 week.  Patient is accompanied by her husband at bedside.  Today, the patient reports she is doing okay.  She states that she has had some increased pain near the right lower extremity wound.  She reports that it is a sharp pain that is fleeting.  She reports it is tender upon palpation now.  Patient denies any drainage from the area.  She denies any fevers or chills.  Review of Systems General: Denies any fevers or chills  Physical Exam    12/22/2022    1:42 PM 12/17/2022   11:47 AM 11/26/2022    9:28 AM  Vitals with BMI  Systolic [phone]  Diastolic 81 79 70  Pulse 683 89 96    General:  No acute distress,  Alert and oriented, Non-Toxic, Normal speech and affect Chaperone present on exam.  On exam, patient is sitting upright in no acute distress.  Right  lower extremity wound is approximately 4.25 cm x 3.5 cm.  There appears to be good granulation tissue throughout the wound.  There is a little bit of exudate noted to the inferior/lateral aspect of the wound.  It appears to be healthy.  There is some mild surrounding irritation and erythema.  There is some tenderness to palpation on exam and a little bit of swelling.  There is no drainage or malodor noted on exam.  Assessment/Plan  Wound of right lower extremity, subsequent encounter   I discussed with the patient that she does appear to have a little bit of irritation surrounding the wound.  I also discussed with her this could be early cellulitis.  I discussed with the patient that given her tenderness and swelling, I will start her on an antibiotic.  I discussed with the patient that she needs to monitor the area.  I discussed with her that if the area becomes more red, swollen, painful, if it begins to drain, or if she develops any fevers, chills or any worsening symptoms, she needs to let us know immediately.  Patient expressed understanding.  Donated collagen was placed on the wound today.  The remainder of the donated collagen was given to the patient.  I discussed with the patient and the patient's husband  that if the collagen has not incorporated by tomorrow, patient can apply more K-Y jelly and leave it in place.  I discussed with the patient and the patient's husband though that if the collagen has incorporated, they can put another small piece of the collagen sheet over the wound along with Adaptic, K-Y jelly, gauze and wrap with Kerlix.  Patient and patient's husband expressed understanding.  Patient to follow-up on Friday for reevaluation.  I discussed with the patient and the patient's husband to call us back if they have any questions or concerns in the meantime.  Pictures were obtained of the patient and placed in the chart with the patient's or guardian's permission.   Sydney Gross 12/22/2022, 2:25 PM

## 2022-12-24 ENCOUNTER — Ambulatory Visit: Payer: Medicare Other | Admitting: Student

## 2022-12-24 DIAGNOSIS — Z7901 Long term (current) use of anticoagulants: Secondary | ICD-10-CM

## 2022-12-24 DIAGNOSIS — E782 Mixed hyperlipidemia: Secondary | ICD-10-CM | POA: Diagnosis not present

## 2022-12-24 DIAGNOSIS — D692 Other nonthrombocytopenic purpura: Secondary | ICD-10-CM | POA: Diagnosis not present

## 2022-12-24 DIAGNOSIS — I2510 Atherosclerotic heart disease of native coronary artery without angina pectoris: Secondary | ICD-10-CM | POA: Diagnosis not present

## 2022-12-24 DIAGNOSIS — I10 Essential (primary) hypertension: Secondary | ICD-10-CM | POA: Diagnosis not present

## 2022-12-24 DIAGNOSIS — S81801D Unspecified open wound, right lower leg, subsequent encounter: Secondary | ICD-10-CM

## 2022-12-24 DIAGNOSIS — Z0000 Encounter for general adult medical examination without abnormal findings: Secondary | ICD-10-CM | POA: Diagnosis not present

## 2022-12-24 DIAGNOSIS — I739 Peripheral vascular disease, unspecified: Secondary | ICD-10-CM | POA: Diagnosis not present

## 2022-12-24 DIAGNOSIS — D649 Anemia, unspecified: Secondary | ICD-10-CM | POA: Diagnosis not present

## 2022-12-24 DIAGNOSIS — J449 Chronic obstructive pulmonary disease, unspecified: Secondary | ICD-10-CM | POA: Diagnosis not present

## 2022-12-24 LAB — LAB REPORT - SCANNED
Creatinine, POC: 221 mg/dL
EGFR: 55
Microalb Creat Ratio: 33.7
Microalbumin, Urine: 7.45

## 2022-12-24 NOTE — Progress Notes (Signed)
Referring Provider Mayra Neer, MD [facility] [place],  [place] [address]   CC:  Chief Complaint  Patient presents with   Follow-up      Sydney Gross is an 89 y.o. female.  HPI: Patient is a 89 year old female with history of injury to her right lower leg while on Eliquis on 11/08/2022 resulting in a hematoma.  Patient's primary care provider, Dr. Brigitte Pulse, eventually drained the hematoma.  Patient has been following up with [name] for ongoing wound management.  Patient was last seen in the clinic on 12/22/2022.  At this visit, patient reported she was doing okay.  She reported she was having some increased pain near her lower extremity wound.  She reported it was tender upon palpation.  On exam, wound to the right lower extremity was approximately 4.25 cm x 3.5 cm.  There appeared to be good granulation tissue throughout the wound bed.  There was some mild surrounding irritation and erythema.  There was some tenderness to palpation and a little bit of swelling.  There is no malodor or drainage noted on exam.  Patient was placed on doxycycline and told to monitor her wound.  Donated collagen was also placed onto the wound and patient was to apply more collagen, Adaptic, K-Y jelly and gauze at home.  Today patient is accompanied by her friend.  Today, she reports she is doing well.  She states that she has been taking the doxycycline.  She reports her pain is much improved in comparison to Wednesday.  She reports she placed collagen on her wound yesterday.  She denies any fevers or chills.  She denies any other issues or concerns.    Patient and patient's friend states that patient saw PCP today and are inquiring about restarting Eliquis.  Review of Systems General: Denies fevers or chills  Physical Exam    12/22/2022    1:42 PM 12/17/2022   11:47 AM 11/26/2022    9:28 AM  Vitals with BMI  Systolic 191 478 295  Diastolic 81 79 70  Pulse 621 89 96    General:  No acute  distress,  Alert and oriented, Non-Toxic, Normal speech and affect Patient is sitting upright in no acute distress on exam.  Wound to the right lower extremity is approximately 4.5 cm x 3 cm.  There is good granulation tissue noted throughout the wound.  Tissue looks healthy and well-vascularized.  There is some trace swelling and mild irritation surrounding the wound, but appears improved from previous exam.  There is no drainage or malodor on exam.  Patient brought her own collagen to clinic today.  Collagen, Adaptic, K-Y jelly, gauze, Curlex and Ace wrap were applied to the right lower extremity.  Assessment/Plan  Wound of right lower extremity, subsequent encounter   I discussed with the patient that she should continue to apply collagen, Adaptic, a small amount of K-Y jelly, gauze, Curlex and Ace wrap.  I discussed with the patient that she should check her wound daily and if the collagen is Incorporated/dissolved into the wound, she may apply more collagen.  I discussed with her though that if it is not completely dissolved, she may leave it on, apply a small amount of K-Y jelly and check it the next day.  I discussed with the patient she can change her Adaptic every 2 to 3 days or when she applies new collagen.  I discussed with the patient that she should change her gauze daily.  Patient expressed understanding.  Instructions were also written for the patient and given to her.  I discussed with the patient to finish her course of antibiotics.  Patient expressed understanding.  I discussed with the patient and patient's friend that in terms of her Eliquis, she should follow the primary care providers recommendations for restarting it.    Patient to follow-up in the clinic next week.  I instructed the patient to call in the meantime if she has questions or concerns.  Pictures were obtained of the patient and placed in the chart with the patient's or guardian's permission.   Clance Boll 12/24/2022, 2:09 PM

## 2022-12-27 ENCOUNTER — Encounter (HOSPITAL_BASED_OUTPATIENT_CLINIC_OR_DEPARTMENT_OTHER): Payer: Medicare Other | Admitting: General Surgery

## 2022-12-28 ENCOUNTER — Telehealth: Payer: Self-pay | Admitting: Student

## 2022-12-28 NOTE — Telephone Encounter (Signed)
I spoke with the patient's PCP, Dr. Brigitte Pulse.  I discussed with Dr. Brigitte Pulse that patient is okay to start Eliquis from our standpoint.  Dr. Brigitte Pulse states that she would like the patient to start her Eliquis.  I called the patient and told her that she may start her Eliquis.  I had discussed with the patient that I had talked with Dr. Brigitte Pulse.  Patient expressed understanding and states that she will start her Eliquis.  Patient reports she is doing well.  She states there is a little bit of swelling in your the wound, but has no other complaints or concerns.  She denies any fevers or chills.  I offered to see the patient today if she wanted to be evaluated.  Patient states that she thinks it will be okay to wait a few days.  She states that she will continue to monitor the area and call back if she has concerns or feels that anything is worsening.  We will plan to see the patient on Thursday.

## 2022-12-29 ENCOUNTER — Ambulatory Visit: Payer: Medicare Other | Admitting: Physician Assistant

## 2022-12-29 ENCOUNTER — Encounter: Payer: Self-pay | Admitting: Physician Assistant

## 2022-12-29 VITALS — BP 145/79 | HR 67

## 2022-12-29 DIAGNOSIS — S81801D Unspecified open wound, right lower leg, subsequent encounter: Secondary | ICD-10-CM

## 2022-12-29 DIAGNOSIS — Z7901 Long term (current) use of anticoagulants: Secondary | ICD-10-CM | POA: Diagnosis not present

## 2022-12-29 NOTE — Progress Notes (Signed)
Referring Provider Mayra Neer, MD [facility] [place],  [place] [address]   CC:  Chief Complaint  Patient presents with   Follow-up      Sydney Gross is an 89 y.o. female.  HPI: Patient is a very pleasant 89 year old female with PMH of injury to right lower leg while on Eliquis 11/08/2022 resulting in hematoma s/p drainage performed by her primary care provider, Dr. Manuella Ghazi. She had been referred to our clinic for ongoing wound management.   She was last seen here in clinic on 12/24/2022.  At that time, she states that she has been taking doxycycline, as prescribed for redness and pain.  She was informed that she can restart her Eliquis, per PCP.  Wound noted to be 4.5 x 3 cm.  Good granular tissue noted throughout the wound,.  Healthy and well-vascularized.  Trace edema.  Plan was for continued collagen followed by Adaptic, K-Y jelly, gauze, Kerlix, and Ace wrap.  Today, patient is accompanied by her daughter at bedside.  She states that the pain and discomfort has greatly improved since her doxycycline.  She did report earlier in the week that she had been having some swelling of that extremity, but admits today that she had been upright and ambulating more frequently over the weekend.  She has been continuing with the dressing changes, as instructed.  Denies any fevers, worsening redness, malodor, pain, or other symptoms.   Allergies  Allergen Reactions   Brilinta [Ticagrelor] Shortness Of Breath   Codeine Nausea And Vomiting   Darvon [Propoxyphene] Nausea Only   Lipitor [Atorvastatin] Other (See Comments)    MYALGIAS   Rosuvastatin Other (See Comments)    MYALGIAS    Simvastatin Other (See Comments)    MYALGIAS    Tramadol Hcl Nausea Only    nausea (08/2019)    Outpatient Encounter Medications as of 12/29/2022  Medication Sig   albuterol (PROVENTIL) (2.5 MG/3ML) 0.083% nebulizer solution Take 3 mLs (2.5 mg total) by nebulization every 2 (two) hours as  needed for wheezing or shortness of breath.   albuterol (VENTOLIN HFA) 108 (90 Base) MCG/ACT inhaler Inhale 1 puff into the lungs every 6 (six) hours as needed for shortness of breath.   apixaban (ELIQUIS) 2.5 MG TABS tablet Take 1 tablet (2.5 mg total) by mouth 2 (two) times daily.   Budeson-Glycopyrrol-Formoterol (BREZTRI AEROSPHERE) 160-9-4.8 MCG/ACT AERO Inhale 2 puffs into the lungs 2 (two) times daily as needed (shortness of breath/ COPD).   denosumab (PROLIA) 60 MG/ML SOSY injection Inject 60 mg as directed every 6 (six) months.   diphenhydramine-acetaminophen (TYLENOL PM) 25-500 MG TABS tablet Take 1 tablet by mouth at bedtime.   Evolocumab (REPATHA SURECLICK) 338 MG/ML SOAJ INJECT 1 PEN INTO THE SKIN EVERY 14 DAYS (Patient taking differently: Inject 140 mg into the skin every 14 (fourteen) days.)   ezetimibe (ZETIA) 10 MG tablet Take 1 tablet by mouth once daily (Patient taking differently: Take 10 mg by mouth daily.)   ipratropium-albuterol (DUONEB) 0.5-2.5 (3) MG/3ML SOLN Take 3 mLs by nebulization every 6 (six) hours as needed.   lisinopril-hydrochlorothiazide (ZESTORETIC) 20-25 MG tablet Take 0.5 tablets by mouth in the morning. (Patient taking differently: Take 0.5 tablets by mouth daily.)   Magnesium Cl-Calcium Carbonate (SLOW-MAG PO) Take 1 tablet by mouth 2 (two) times daily.   metoprolol succinate (TOPROL-XL) 25 MG 24 hr tablet Take 1 tablet (25 mg total) by mouth daily. Pt needs to make appt to see provider  for further refills - 2nd attempt (Patient taking differently: Take 25 mg by mouth daily.)   metoprolol tartrate (LOPRESSOR) 25 MG tablet Take one half - one tablet (12.5 mg - 25 mg) as needed for Heart Rate 100 or above. (Patient taking differently: Take 12.5 mg by mouth daily as needed (for heart rate >= 100).)   nitroGLYCERIN (NITROSTAT) 0.4 MG SL tablet Place 0.4 mg under the tongue every 5 (five) minutes as needed for chest pain.   pantoprazole (PROTONIX) 40 MG tablet Take 1  tablet (40 mg total) by mouth 2 (two) times daily before a meal.   sucralfate (CARAFATE) 1 GM/10ML suspension Take 10 mLs (1 g total) by mouth 3 (three) times daily.   tiZANidine (ZANAFLEX) 2 MG tablet Take 2 mg by mouth at bedtime.   No facility-administered encounter medications on file as of 12/29/2022.     Past Medical History:  Diagnosis Date   Allergic rhinitis    Arthritis    "maybe a little in my knees and back" (07/17/2018)   Chronic lower back pain    Colitis 12/2013   "never had this before"   COPD (chronic obstructive pulmonary disease) ([facility])    "questionable/Dr. Tamala Julian" (07/17/2018)   Coronary artery disease    GI bleed    H/O calcium pyrophosphate deposition disease (CPPD)    Hiatal hernia    Hyperlipidemia    Hypertension    Mild mitral regurgitation    On home oxygen therapy    "I've used it 2 times in 2 yrs" (07/17/2018)   PAD (peripheral artery disease) ([facility])    PAF (paroxysmal atrial fibrillation) ([facility])    Pulmonary hypertension ([facility])    Shingles     Past Surgical History:  Procedure Laterality Date   ABDOMINAL AORTOGRAM W/LOWER EXTREMITY N/A 07/10/2021   Procedure: ABDOMINAL AORTOGRAM W/LOWER EXTREMITY;  Surgeon: Elam Dutch, MD;  Location: [facility];  Service: Cardiovascular;  Laterality: N/A;   ABDOMINAL HYSTERECTOMY  1960's   APPENDECTOMY     BACK SURGERY     BIOPSY  04/20/2021   Procedure: BIOPSY;  Surgeon: Carol Ada, MD;  Location: [facility];  Service: Endoscopy;;   BIOPSY  09/14/2022   Procedure: BIOPSY;  Surgeon: Carol Ada, MD;  Location: [facility];  Service: Gastroenterology;;   BLEPHAROPLASTY Bilateral 05/2017   CARDIAC CATHETERIZATION     CATARACT EXTRACTION, BILATERAL Bilateral    CESAREAN SECTION  1957; 1960   CORONARY STENT INTERVENTION N/A 07/17/2018   Procedure: CORONARY STENT INTERVENTION;  Surgeon: Belva Crome, MD;  Location: [facility];  Service: Cardiovascular;  Laterality: N/A;    DACROCYSTORHINOSTOMY W/ JONES TUBE Bilateral 05/2017   "put tear duct in my eyes"   ESOPHAGOGASTRODUODENOSCOPY (EGD) WITH PROPOFOL N/A 04/20/2021   Procedure: ESOPHAGOGASTRODUODENOSCOPY (EGD) WITH PROPOFOL;  Surgeon: Carol Ada, MD;  Location: [facility];  Service: Endoscopy;  Laterality: N/A;   ESOPHAGOGASTRODUODENOSCOPY (EGD) WITH PROPOFOL N/A 09/14/2022   Procedure: ESOPHAGOGASTRODUODENOSCOPY (EGD) WITH PROPOFOL;  Surgeon: Carol Ada, MD;  Location: [facility];  Service: Gastroenterology;  Laterality: N/A;  CAD, COPD, PAF   EYE SURGERY     LAPAROSCOPIC CHOLECYSTECTOMY  ~ 2009   LUMBAR [place] SURGERY  ?2004   PERIPHERAL VASCULAR INTERVENTION Left 07/10/2021   Procedure: PERIPHERAL VASCULAR INTERVENTION;  Surgeon: Elam Dutch, MD;  Location: [facility];  Service: Cardiovascular;  Laterality: Left;  common, external iliac arteries   RIGHT/LEFT HEART CATH AND CORONARY ANGIOGRAPHY N/A 07/17/2018   Procedure:  RIGHT/LEFT HEART CATH AND CORONARY ANGIOGRAPHY;  Surgeon: Lyn Records, MD;  Location: [facility];  Service: Cardiovascular;  Laterality: N/A;   TONSILLECTOMY AND ADENOIDECTOMY  ?1940's    Family History  Problem Relation Age of Onset   Emphysema Father 49   CVA Mother    Cancer Brother        ? type    Social History   Social History Narrative   Not on file     Review of Systems General: Denies fevers or chills Skin: Denies any worsening pain or or malodor  Physical Exam    12/22/2022    1:42 PM 12/17/2022   11:47 AM 11/26/2022    9:28 AM  Vitals with BMI  Systolic 137 132 301  Diastolic 81 79 70  Pulse 107 89 96    General:  No acute distress, nontoxic appearing  Respiratory: No increased work of breathing Neuro: Alert and oriented Psychiatric: Normal mood and affect  Skin: Advanced epithelialization with chronic wound now measuring at 3.5 x 2 x 0.25 cm.  Excellent granulation.  No considerable surrounding erythema or induration otherwise  concerning for cellulitis.  Trace edema, no pedal edema.  Pedal pulse intact.  Good strength throughout.  Assessment/Plan  Right lower extremity wound: Continue advanced epithelialization with wound now measuring 3.5 x 2 x 0.25 cm.  Recommend continued collagen every other day and daily Adaptic followed by K-Y jelly, gauze, Kerlix, and secured with Ace wrap.  No evidence concerning for infection.  Recommend that she elevate the leg whenever possible to help with dependent edema.  Return in 2 to 3 weeks, sooner if needed.  Patient and daughter pleased with the wound progress.  Picture(s) obtained of the patient and placed in the chart were with the patient's or guardian's permission.   Evelena Leyden 12/29/2022, 1:18 PM

## 2022-12-31 ENCOUNTER — Ambulatory Visit: Payer: Medicare Other | Admitting: Surgical

## 2022-12-31 ENCOUNTER — Telehealth: Payer: Self-pay

## 2022-12-31 ENCOUNTER — Ambulatory Visit: Payer: Medicare Other | Admitting: Student

## 2022-12-31 DIAGNOSIS — S81801A Unspecified open wound, right lower leg, initial encounter: Secondary | ICD-10-CM | POA: Diagnosis not present

## 2022-12-31 NOTE — Telephone Encounter (Signed)
Faxed wound supply order to [facility] with demographics, insurance card and most recent ov note. Received fax success confirmation. Forwarded order to front desk for batch scanning.  

## 2023-01-04 ENCOUNTER — Telehealth: Payer: Self-pay | Admitting: Plastic Surgery

## 2023-01-04 NOTE — Telephone Encounter (Signed)
Yes, I spoke with Dr. Marla Roe afterwards and she recommended transitioning to the [facility] patch. Nothing to do except change it daily. Thank you!

## 2023-01-04 NOTE — Telephone Encounter (Signed)
Pt dtr called to clarify how to dress wound with bandage and medication.  Please contact her asap.

## 2023-01-07 ENCOUNTER — Telehealth: Payer: Self-pay | Admitting: *Deleted

## 2023-01-07 NOTE — Telephone Encounter (Signed)
S/w pt to let pt know lab appt was canceled due to receiving labs from PCP.

## 2023-01-11 ENCOUNTER — Other Ambulatory Visit: Payer: Medicare Other

## 2023-01-16 ENCOUNTER — Other Ambulatory Visit: Payer: Self-pay | Admitting: Nurse Practitioner

## 2023-01-16 NOTE — Progress Notes (Unsigned)
Cardiology Office Note:    Date:  01/18/2023   ID:  Sydney Gross, DOB Jul 15, 1930, MRN [id number]  PCP:  Mayra Neer, MD   [facility] Providers Cardiologist:  Sinclair Grooms, MD (Inactive)     Referring MD: Mayra Neer, MD   Chief Complaint: follow-up atrial flutter  History of Present Illness:    Sydney Gross is a very pleasant 89 y.o. female with a hx of CAD with prior stents, HLD, HTN, COPD, PAD with claudication and prior stents, chronic DOE, PAF.   07/10/21 she underwent left common iliac stenting x2 and left external iliac stenting for left leg claudication with left iliac occlusion.  Has known flush SFA occlusion bilaterally as well. Most recent cardiac catheterization revealed high-grade obstruction in mid LAD and proximal to mid first diagonal with successful PCI/stent to both the mid LAD and proximal to mid first diagonal. Plan to continue DAPT for 6 months after which Plavix could be discontinued.   She was last seen in our office on 11/13/2021 by Dr. Tamala Julian at which time he documented discussion of history of COPD.  She never smoked nor had environmental exposure and recent CXR showed no evidence of COPD.  Admission 7/12-7/14/23 with PAF which spontaneously converted.  Because of her age and high CHA2DS2-VASc she was started on Eliquis.  Subsequently and probably unrelated she developed abdominal bloating and was diagnosed with diverticulosis.  Antibiotics were started and her symptoms improved.  This was guided by general surgery hospitalists.  She also had nausea and vertigo which improved with Antivert. Cardiac monitor was ordered at hospital discharge which revealed basic underlying rhythm NSR with average HR 78 bpm, atrial flutter burden 7% with HR ranging from 10 6-1 47.  Longest episode greater than 20 hours. PVC burden less than 1%. Nocturnal bradycardia in the high 40s on occasion.  Seen by me on 08/05/22, accompanied by her daughter. Reported feeling  well but had severe back spasms after hospitalization that she thinks were 2/2 being less mobile than normal. Worked with PT for 6 weeks and is feeling back to normal over the past few days. Continued to take aspirin which should have stopped in setting of adding [place].  Was planning for vascular surgery at the time of onset of a fib. Felt onset of a fib after drinking a frothy beverage.  EKG at office visit revealed NSR at 71 bpm.  She was advised to stop ASA and continue metoprolol succinate.  I gave her metoprolol tartrate 25 mg to take 1/2-1 tab if HR > 100 bpm. She was asymptomatic from a cardiac perspective and advised to return in 6 months for follow-up.   Today, she is here with her daughter for follow-up. Reports she has had difficulty with a RLE wound from a car door since 11/08/22. Was off blood thinner for 2 months, continues to see wound care but hopeful to be discharged this week.  Continues to have chronic back pain which is biggest burden to her. Has been eating less healthy foods due to poor appetite and weight loss. Some days she does not eat at all. She remains active at home. Some activity is  limited by back pain. She denies chest pain, shortness of breath, lower extremity edema, fatigue, palpitations, melena, hematuria, hemoptysis, diaphoresis, weakness, presyncope, syncope, orthopnea, and PND. Has not taken extra metoprolol for a fib/palpitations.   Past Medical History:  Diagnosis Date   Allergic rhinitis    Arthritis    "  maybe a little in my knees and back" (07/17/2018)   Chronic lower back pain    Colitis 12/2013   "never had this before"   COPD (chronic obstructive pulmonary disease) ([place])    "questionable/Dr. Tamala Julian" (07/17/2018)   Coronary artery disease    GI bleed    H/O calcium pyrophosphate deposition disease (CPPD)    Hiatal hernia    Hyperlipidemia    Hypertension    Mild mitral regurgitation    On home oxygen therapy    "I've used it 2 times in 2 yrs" (07/17/2018)    PAD (peripheral artery disease) ([facility])    PAF (paroxysmal atrial fibrillation) ([place])    Pulmonary hypertension ([place])    Shingles     Past Surgical History:  Procedure Laterality Date   ABDOMINAL AORTOGRAM W/LOWER EXTREMITY N/A 07/10/2021   Procedure: ABDOMINAL AORTOGRAM W/LOWER EXTREMITY;  Surgeon: Elam Dutch, MD;  Location: [place] [facility];  Service: Cardiovascular;  Laterality: N/A;   ABDOMINAL HYSTERECTOMY  1960's   APPENDECTOMY     BACK SURGERY     BIOPSY  04/20/2021   Procedure: BIOPSY;  Surgeon: Carol Ada, MD;  Location: [facility];  Service: Endoscopy;;   BIOPSY  09/14/2022   Procedure: BIOPSY;  Surgeon: Carol Ada, MD;  Location: [place];  Service: Gastroenterology;;   BLEPHAROPLASTY Bilateral 05/2017   CARDIAC CATHETERIZATION     CATARACT EXTRACTION, BILATERAL Bilateral    CESAREAN SECTION  1957; 1960   CORONARY STENT INTERVENTION N/A 07/17/2018   Procedure: CORONARY STENT INTERVENTION;  Surgeon: Belva Crome, MD;  Location: [place] [facility];  Service: Cardiovascular;  Laterality: N/A;   DACROCYSTORHINOSTOMY W/ JONES TUBE Bilateral 05/2017   "put tear duct in my eyes"   ESOPHAGOGASTRODUODENOSCOPY (EGD) WITH PROPOFOL N/A 04/20/2021   Procedure: ESOPHAGOGASTRODUODENOSCOPY (EGD) WITH PROPOFOL;  Surgeon: Carol Ada, MD;  Location: [facility];  Service: Endoscopy;  Laterality: N/A;   ESOPHAGOGASTRODUODENOSCOPY (EGD) WITH PROPOFOL N/A 09/14/2022   Procedure: ESOPHAGOGASTRODUODENOSCOPY (EGD) WITH PROPOFOL;  Surgeon: Carol Ada, MD;  Location: [place];  Service: Gastroenterology;  Laterality: N/A;  CAD, COPD, PAF   EYE SURGERY     LAPAROSCOPIC CHOLECYSTECTOMY  ~ 2009   LUMBAR [place] SURGERY  ?2004   PERIPHERAL VASCULAR INTERVENTION Left 07/10/2021   Procedure: PERIPHERAL VASCULAR INTERVENTION;  Surgeon: Elam Dutch, MD;  Location: [place] [facility];  Service: Cardiovascular;  Laterality: Left;  common, external iliac arteries   RIGHT/LEFT  HEART CATH AND CORONARY ANGIOGRAPHY N/A 07/17/2018   Procedure: RIGHT/LEFT HEART CATH AND CORONARY ANGIOGRAPHY;  Surgeon: Belva Crome, MD;  Location: [place] [facility];  Service: Cardiovascular;  Laterality: N/A;   TONSILLECTOMY AND ADENOIDECTOMY  ?1940's    Current Medications: Current Meds  Medication Sig   albuterol (PROVENTIL) (2.5 MG/3ML) 0.083% nebulizer solution Take 3 mLs (2.5 mg total) by nebulization every 2 (two) hours as needed for wheezing or shortness of breath.   albuterol (VENTOLIN HFA) 108 (90 Base) MCG/ACT inhaler Inhale 1 puff into the lungs every 6 (six) hours as needed for shortness of breath.   apixaban (ELIQUIS) 2.5 MG TABS tablet Take 1 tablet (2.5 mg total) by mouth 2 (two) times daily.   Budeson-Glycopyrrol-Formoterol (BREZTRI AEROSPHERE) 160-9-4.8 MCG/ACT AERO Inhale 2 puffs into the lungs 2 (two) times daily as needed (shortness of breath/ COPD).   denosumab (PROLIA) 60 MG/ML SOSY injection Inject 60 mg as directed every 6 (six) months.   diphenhydramine-acetaminophen (TYLENOL PM) 25-500 MG TABS tablet Take 1  tablet by mouth at bedtime.   Evolocumab (REPATHA SURECLICK) [id number] MG/ML SOAJ INJECT 1 PEN INTO THE SKIN EVERY 14 DAYS (Patient taking differently: Inject 140 mg into the skin every 14 (fourteen) days.)   ezetimibe (ZETIA) 10 MG tablet Take 1 tablet (10 mg total) by mouth daily.   ipratropium-albuterol (DUONEB) 0.5-2.5 (3) MG/3ML SOLN Take 3 mLs by nebulization every 6 (six) hours as needed.   lisinopril-hydrochlorothiazide (ZESTORETIC) 20-25 MG tablet Take 0.5 tablets by mouth in the morning. (Patient taking differently: Take 0.5 tablets by mouth daily.)   Magnesium Cl-Calcium Carbonate (SLOW-MAG PO) Take 1 tablet by mouth 2 (two) times daily.   metoprolol succinate (TOPROL-XL) 25 MG 24 hr tablet Take 1 tablet (25 mg total) by mouth daily. Pt needs to make appt to see provider for further refills - 2nd attempt (Patient taking differently: Take 25 mg by mouth  daily.)   metoprolol tartrate (LOPRESSOR) 25 MG tablet Take one half - one tablet (12.5 mg - 25 mg) as needed for Heart Rate 100 or above. (Patient taking differently: Take 12.5 mg by mouth daily as needed (for heart rate >= 100).)   nitroGLYCERIN (NITROSTAT) 0.4 MG SL tablet Place 0.4 mg under the tongue every 5 (five) minutes as needed for chest pain.   tiZANidine (ZANAFLEX) 2 MG tablet Take 2 mg by mouth at bedtime.     Allergies:   Brilinta [ticagrelor], Codeine, Darvon [propoxyphene], Lipitor [atorvastatin], Rosuvastatin, Simvastatin, and Tramadol hcl   Social History   Socioeconomic History   Marital status: Married    Spouse name: Meda Coffee   Number of children: Not on file   Years of education: 12   Highest education level: High school graduate  Occupational History   Occupation: Retired  Tobacco Use   Smoking status: Never    Passive exposure: Past   Smokeless tobacco: Never  Vaping Use   Vaping Use: Never used  Substance and Sexual Activity   Alcohol use: Yes    Alcohol/week: 4.0 standard drinks of alcohol    Types: 4 Glasses of wine per week    Comment: glass of wine every other night   Drug use: Never   Sexual activity: Not Currently  Other Topics Concern   Not on file  Social History Narrative   Not on file   Social Determinants of Health   Financial Resource Strain: Low Risk  (08/29/2018)   Overall Financial Resource Strain (CARDIA)    Difficulty of Paying Living Expenses: Not very hard  Food Insecurity: No Food Insecurity (09/11/2022)   Hunger Vital Sign    Worried About Running Out of Food in the Last Year: Never true    Ran Out of Food in the Last Year: Never true  Transportation Needs: No Transportation Needs (09/11/2022)   PRAPARE - Hydrologist (Medical): No    Lack of Transportation (Non-Medical): No  Physical Activity: Inactive (08/29/2018)   Exercise Vital Sign    Days of Exercise per Week: 0 days    Minutes of Exercise  per Session: 0 min  Stress: No Stress Concern Present (08/29/2018)   [place]    Feeling of Stress : Not at all  Social Connections: Not on file     Family History: The patient's family history includes CVA in her mother; Cancer in her brother; Emphysema (age of onset: 51) in her father.  ROS:   Please see the history of present illness.  +  chronic back pain All other systems reviewed and are negative.  Labs/Other Studies Reviewed:    The following studies were reviewed today:  Cardiac monitor 07/23/22    Basic underlying rhythm is normal sinus rhythm with average heart rate 78 bpm   Atrial flutter burden 7% with heart rate ranging between 106 and 147 bpm, average 121 bpm.  Longest episode greater than 20 hours.   PVC burden less than 1%   Nocturnal bradycardia in the high 40s on occasion.  Echo 06/17/22  1. Left ventricular ejection fraction, by estimation, is 65 to 70%. The  left ventricle has normal function. The left ventricle has no regional  wall motion abnormalities. Moderate asymmetric focal basal septal left  ventricular hypertrophy. There was  turbulence in the LV outflow tract but no significant LVOT gradient was  measured. No mitral valve SAM noted. Left ventricular diastolic parameters  are consistent with Grade II diastolic dysfunction (pseudonormalization).   2. Right ventricular systolic function is normal. The right ventricular  size is normal. There is moderately elevated pulmonary artery systolic  pressure. The estimated right ventricular systolic pressure is 0000000 mmHg.   3. Left atrial size was mildly dilated.   4. The mitral valve is normal in structure. Mild mitral valve  regurgitation. No evidence of mitral stenosis.   5. The aortic valve is tricuspid. There is mild calcification of the  aortic valve. Aortic valve regurgitation is mild. No aortic stenosis is  present.   6. The inferior  vena cava is normal in size with greater than 50%  respiratory variability, suggesting right atrial pressure of 3 mmHg.   R/LHC 07/17/18  Coronary artery disease with high-grade obstruction in the mid LAD and proximal to mid first diagonal. Normal left main. Normal circumflex coronary artery. Dominant RCA with luminal irregularities up to 40% distally. Right heart pressures Ventricular systolic function and LVEDP (14 mmHg). Successful PCI and stent implantation in both the mid LAD and proximal to mid first diagonal.   The LAD 90% stenosis was reduced to 0% with TIMI grade III flow using a 2.5 x 28 Synergy postdilated to 2.75 mm in diameter.  TIMI grade III flow pre-and post stenting. The first diagonal 90% stenosis was reduced to 0% using by 2.25 mm Synergy deployed at 14 atm which is not effective stent size of 2.32 mm.  TIMI grade III flow noted pre-and post PCI.   RECOMMENDATIONS:   Continue aggressive risk factor modification: PCSK9 therapy (Repatha), moderate aerobic physical activity, blood pressure control less than 140/90 mmHg, and surveillance of glycemic control. Aspirin and Brilinta x1 month.  Switch to aspirin and Plavix thereafter.  Total duration of dual antiplatelet therapy 6 months at which time Plavix can be dropped. Discharge in a.m.   Recommend uninterrupted dual antiplatelet therapy with Aspirin 60m daily and Ticagrelor 962mtwice daily for a minimum of 6 months (stable ischemic heart disease - Class I recommendation).  Recent Labs: 06/18/2022: TSH 1.487 08/16/2022: B Natriuretic Peptide 70.4 09/10/2022: Magnesium 2.2 09/12/2022: ALT 13; BUN 18; Creatinine, Ser 0.87; Potassium 3.9; Sodium 134 09/15/2022: Hemoglobin 9.9; Platelets 216  Recent Lipid Panel From [facility] 12/24/22 LDL 143, HDL 48, trigs 188   Risk Assessment/Calculations:    CHA2DS2-VASc Score = 5  This indicates a 7.2% annual risk of stroke. The patient's score is based upon: CHF History: 0 HTN History:  1 Diabetes History: 0 Stroke History: 0 Vascular Disease History: 1 Age Score: 2 Gender Score: 1    Physical Exam:  VS:  BP 124/68   Pulse 66   Ht 4' 11"$  (1.499 m)   Wt 105 lb 9.6 oz (47.9 kg)   SpO2 94%   BMI 21.33 kg/m     Wt Readings from Last 3 Encounters:  01/18/23 105 lb 9.6 oz (47.9 kg)  11/08/22 105 lb 9.6 oz (47.9 kg)  10/19/22 105 lb 9.6 oz (47.9 kg)     GEN:  Well nourished, well developed in no acute distress HEENT: Normal NECK: No JVD; No carotid bruits CARDIAC: RRR, 2/6 systolic murmur, rubs, gallops RESPIRATORY:  Clear to auscultation without rales, wheezing or rhonchi  ABDOMEN: Soft, non-tender, non-distended MUSCULOSKELETAL:  No edema; No deformity. 2+ pedal pulses, equal bilaterally SKIN: Warm and dry NEUROLOGIC:  Alert and oriented x 3 PSYCHIATRIC:  Normal affect   EKG:  EKG is not ordered today  Diagnoses:    1. Coronary artery disease involving native coronary artery of native heart without angina pectoris   2. Hyperlipidemia LDL goal <70   3. Essential hypertension   4. Aortic valve insufficiency, etiology of cardiac valve disease unspecified   5. Mild mitral regurgitation     Assessment and Plan:     Paroxysmal atrial flutter on chronic anticoagulation: Clinically appears to be maintaining SR today. HR is well controlled. She is asymptomatic. Was off Eliquis due to leg wound for a couple of months per daughter, has resumed. Has not taken prn Lopressor. Continue Toprol. No bleeding  No other concerns.   CAD without angina: History of DES x 2 to mid LAD and proximal to mid first diagonal, 40% lesion dRCA 2019. She denies chest pain, dyspnea, or other symptoms concerning for angina.  No indication for further ischemic evaluation at this time.  Not on aspirin in the setting of [place].  Continue metoprolol, ezetimibe, lisinopril-HCTZ, Repatha. Will add low dose rosuvastatin as noted below.   Hyperlipidemia: LDL 143 on 12/24/22, slight increase from  previous reading. Lengthy discussion about diet. Admits to poor diet at times, but does not want to lose more weight and does not get hungry some days. Compliant with Repatha and ezetimibe. She is agreeable to add rosuvastatin 5 mg 3 days per week. Advised her to notify us if it causes myalgia. If tolerant, will recheck in 2-3 months.   Valve disease: Soft systolic murmur on exam. Echo 06/17/2022 revealed mild mitral valve regurgitation, mild calcification of the aortic valve, mild aortic valve regurgitation, no aortic stenosis. Asymptomatic. Will recheck if clinically indicated.      Disposition: 6 months with me  Medication Adjustments/Labs and Tests Ordered: Current medicines are reviewed at length with the patient today.  Concerns regarding medicines are outlined above.  No orders of the defined types were placed in this encounter.  No orders of the defined types were placed in this encounter.   Patient Instructions  Medication Instructions:  Your physician recommends that you continue on your current medications as directed. Please refer to the Current Medication list given to you today. *If you need a refill on your cardiac medications before your next appointment, please call your pharmacy*   Lab Work: None ordered  Testing/Procedures: None ordered  Follow-Up: At [facility], you and your health needs are our priority.  As part of our continuing mission to provide you with exceptional heart care, we have created designated Provider Care Teams.  These Care Teams include your primary Cardiologist (physician) and Advanced Practice Providers (APPs -  Physician Assistants and Nurse Practitioners) who  all work together to provide you with the care you need, when you need it.  We recommend signing up for the patient portal called "MyChart".  Sign up information is provided on this After Visit Summary.  MyChart is used to connect with patients for Virtual Visits (Telemedicine).   Patients are able to view lab/test results, encounter notes, upcoming appointments, etc.  Non-urgent messages can be sent to your provider as well.   To learn more about what you can do with MyChart, go to [email or web address].    Your next appointment:   6 month(s)  Provider:   Christen Bame, NP      Other Instructions     Signed, Emmaline Life, NP  01/18/2023 12:19 PM    [place]

## 2023-01-17 ENCOUNTER — Other Ambulatory Visit: Payer: Self-pay

## 2023-01-17 DIAGNOSIS — I25118 Atherosclerotic heart disease of native coronary artery with other forms of angina pectoris: Secondary | ICD-10-CM

## 2023-01-17 DIAGNOSIS — E785 Hyperlipidemia, unspecified: Secondary | ICD-10-CM

## 2023-01-17 MED ORDER — EZETIMIBE 10 MG PO TABS: 10.0000 mg | ORAL_TABLET | Freq: Every day | ORAL | 1 refills | Status: DC

## 2023-01-18 ENCOUNTER — Telehealth: Payer: Self-pay

## 2023-01-18 ENCOUNTER — Ambulatory Visit: Payer: Medicare Other | Attending: Nurse Practitioner | Admitting: Nurse Practitioner

## 2023-01-18 ENCOUNTER — Encounter: Payer: Self-pay | Admitting: Nurse Practitioner

## 2023-01-18 VITALS — BP 124/68 | HR 66 | Ht 59.0 in | Wt 105.6 lb

## 2023-01-18 DIAGNOSIS — I351 Nonrheumatic aortic (valve) insufficiency: Secondary | ICD-10-CM

## 2023-01-18 DIAGNOSIS — I10 Essential (primary) hypertension: Secondary | ICD-10-CM | POA: Diagnosis not present

## 2023-01-18 DIAGNOSIS — I2510 Atherosclerotic heart disease of native coronary artery without angina pectoris: Secondary | ICD-10-CM

## 2023-01-18 DIAGNOSIS — E785 Hyperlipidemia, unspecified: Secondary | ICD-10-CM

## 2023-01-18 DIAGNOSIS — I340 Nonrheumatic mitral (valve) insufficiency: Secondary | ICD-10-CM

## 2023-01-18 MED ORDER — ROSUVASTATIN CALCIUM 5 MG PO TABS: 5.0000 mg | ORAL_TABLET | ORAL | 3 refills | Status: DC

## 2023-01-18 NOTE — Telephone Encounter (Signed)
Patient has been notified directly; all questions, if any, were answered. Patient voiced understanding.   Rx(s) sent to pharmacy electronically.  Lab appointment has been scheduled. (03/18/23)

## 2023-01-18 NOTE — Telephone Encounter (Signed)
Per Christen Bame, NP:  Nigel Sloop, I saw this lady earlier. Will you please call her and tell her we are going to add rosuvastatin 5 mg 3 days per week and recheck lipid/ALT in 2-3 months. Pls ask her to let us know if it causes her pain and she cannot tolerate it. Thanks!

## 2023-01-18 NOTE — Patient Instructions (Signed)
Medication Instructions:  Your physician recommends that you continue on your current medications as directed. Please refer to the Current Medication list given to you today. *If you need a refill on your cardiac medications before your next appointment, please call your pharmacy*   Lab Work: None ordered  Testing/Procedures: None ordered  Follow-Up: At [facility], you and your health needs are our priority.  As part of our continuing mission to provide you with exceptional heart care, we have created designated Provider Care Teams.  These Care Teams include your primary Cardiologist (physician) and Advanced Practice Providers (APPs -  Physician Assistants and Nurse Practitioners) who all work together to provide you with the care you need, when you need it.  We recommend signing up for the patient portal called "MyChart".  Sign up information is provided on this After Visit Summary.  MyChart is used to connect with patients for Virtual Visits (Telemedicine).  Patients are able to view lab/test results, encounter notes, upcoming appointments, etc.  Non-urgent messages can be sent to your provider as well.   To learn more about what you can do with MyChart, go to [email or web address].    Your next appointment:   6 month(s)  Provider:   Christen Bame, NP      Other Instructions

## 2023-01-19 ENCOUNTER — Ambulatory Visit: Payer: Medicare Other | Admitting: Student

## 2023-01-19 DIAGNOSIS — S81801D Unspecified open wound, right lower leg, subsequent encounter: Secondary | ICD-10-CM | POA: Diagnosis not present

## 2023-01-19 NOTE — Progress Notes (Signed)
   Referring Provider Mayra Neer, MD [facility] [address] [employer] [address] [place],  [place] [address]   CC:  Chief Complaint  Patient presents with   Follow-up      Sydney Gross is an 89 y.o. female.  HPI: Patient is a 89 year old female with history of injury to her right lower leg while on Eliquis on 11/08/2022 resulting in a hematoma.  Patient's primary care provider, Dr. Brigitte Pulse, eventually drained the hematoma.  Patient has been following up with [name] for ongoing wound management.  Patient was last seen in the clinic on 12/29/2022.  At this visit, she reported she was doing well.  She reported her pain and discomfort had greatly improved.  On exam, advanced epithelialization with chronic wound was noted and was approximately 3.5 x 2 x 0.25 cm.  There is excellent granulation tissue noted.  There is no considerable surrounding erythema or induration.  There is trace edema noted throughout.  Plan was for patient to continue collagen every other day and daily Adaptic followed by K-Y jelly, gauze Curlex and Ace wrap.  Patient was recommended to follow-up in 2 to 3 weeks for reevaluation.  In the days following patient's appointment on 12/29/2022, patient was recommended to transition to Medihoney patches.  Patient is accompanied by her husband at bedside.  Today, patient reports she is doing well.  She states she has been applying the Medihoney patches every other day.  She reports she has a little bit of pain from time to time, but reports that overall her pain is much improved.  Patient denies any other issues or concerns.  She denies any fevers or chills.  She denies any drainage from the wound.  Review of Systems General: Denies any fevers or chills  Physical Exam    01/18/2023   10:35 AM 12/29/2022    1:15 PM 12/22/2022    1:42 PM  Vitals with BMI  Height 4\' 11"     Weight 105 lbs 10 oz    BMI 83.66    Systolic 294 765 465  Diastolic 68 79 81  Pulse 66 67 107    General:  No acute  distress,  Alert and oriented, Non-Toxic, Normal speech and affect On exam, patient is sitting upright in no acute distress.  Wound to the right lower extremity is approximately 2 cm x 2 cm.  There appears to be scabbing noted overlying the wound.  There is no surrounding erythema or drainage.  There are no signs of infection on exam.  Assessment/Plan  Wound of right lower extremity, subsequent encounter   I discussed with the patient that she should continue the Medihoney patches for another week.  I discussed with her that we will have her follow-up in 1 week .  I discussed with her that if the wound continues to improve, we will plan to transition her to Vaseline daily.  Patient expressed understanding and was in agreement with this plan.  Patient to follow-up in 1 week.  I instructed the patient to call in the meantime if she has any questions or concerns.  Pictures were obtained of the patient and placed in the chart with the patient's or guardian's permission.   Clance Boll 01/19/2023, 3:21 PM

## 2023-01-26 ENCOUNTER — Ambulatory Visit: Payer: Medicare Other | Admitting: Student

## 2023-02-02 ENCOUNTER — Ambulatory Visit: Payer: Medicare Other | Admitting: Student

## 2023-02-02 DIAGNOSIS — S81801D Unspecified open wound, right lower leg, subsequent encounter: Secondary | ICD-10-CM

## 2023-02-02 NOTE — Progress Notes (Signed)
   Referring Provider Mayra Neer, MD [facility] [place],  [place] [address]   CC: No chief complaint on file.     Sydney Gross is an 89 y.o. female.  HPI: Patient is a 89 year old female with history of injury to her right lower leg while on Eliquis on 11/08/2022 resulting in a hematoma.  Patient's primary care provider, Dr. Brigitte Pulse, eventually drained the hematoma.  Patient has been following up with [name] for ongoing wound management.  Patient was last seen here in the clinic on 01/19/2023.  At this visit, patient reported she was doing well.  She stated that she was applying Medihoney patches to her right lower extremity every other day.  On exam, the wound to the right lower extremity appears to be approximately 2 cm x 2 cm in size.  There is some scabbing noted over the wound.  There is no surrounding erythema or drainage.  Plan was for patient to continue Medihoney patches and follow-up with [name].  Patient is accompanied by her daughter at bedside.  Today, patient reports she is doing well.  She has no new complaints or concerns.  Patient states that she feels the wound has been healing really well.  Review of Systems General: Denies fevers or chills  Physical Exam    01/18/2023   10:35 AM 12/29/2022    1:15 PM 12/22/2022    1:42 PM  Vitals with BMI  Height 4' 11"$     Weight 105 lbs 10 oz    BMI [id number]    Systolic [id number]  Diastolic 68 79 81  Pulse 66 67 107    General:  No acute distress,  Alert and oriented, Non-Toxic, Normal speech and affect On exam, patient is sitting upright in no acute distress.  To the right lower extremity, wound has almost completely epithelialized.  There is a small area of superficial dryness/scabbing.  There is no surrounding erythema, swelling or drainage.  There are no signs of infection on exam.  Assessment/Plan  Wound of right lower extremity, subsequent encounter   I discussed with the patient and the patient's daughter  that they may now transition to applying Vaseline to the area daily.  I discussed with them that they can do this for the next few weeks, and then start a scar cream if they would like afterwards.  Patient and patient's daughter expressed understanding.  Patient to follow-up as needed.  I instructed the patient and the patient's daughter to call us back if they have any questions or concerns.  Pictures were obtained of the patient and placed in the chart with the patient's or guardian's permission.   Clance Boll 02/02/2023, 8:45 AM

## 2023-02-03 ENCOUNTER — Other Ambulatory Visit: Payer: Self-pay

## 2023-02-03 DIAGNOSIS — E785 Hyperlipidemia, unspecified: Secondary | ICD-10-CM

## 2023-02-03 DIAGNOSIS — I25118 Atherosclerotic heart disease of native coronary artery with other forms of angina pectoris: Secondary | ICD-10-CM

## 2023-02-03 MED ORDER — METOPROLOL SUCCINATE ER 25 MG PO TB24: 25.0000 mg | ORAL_TABLET | Freq: Every day | ORAL | 3 refills | Status: DC

## 2023-02-03 MED ORDER — METOPROLOL TARTRATE 25 MG PO TABS: ORAL_TABLET | ORAL | 6 refills | Status: DC

## 2023-02-03 MED ORDER — EZETIMIBE 10 MG PO TABS: 10.0000 mg | ORAL_TABLET | Freq: Every day | ORAL | 3 refills | Status: DC

## 2023-02-03 NOTE — Addendum Note (Signed)
Addended by: Carter Kitten D on: 02/03/2023 12:42 PM   Modules accepted: Orders

## 2023-03-11 ENCOUNTER — Other Ambulatory Visit (HOSPITAL_COMMUNITY): Payer: Self-pay

## 2023-03-11 ENCOUNTER — Telehealth: Payer: Self-pay

## 2023-03-11 NOTE — Telephone Encounter (Signed)
Pharmacy Patient Advocate Encounter   Received notification from [employer] that prior authorization for REPATHA is needed.    PA submitted on 03/11/23 VIA [employer] PORTAL  Status is pending  Haze Rushing, CPhT Pharmacy Patient Advocate Specialist Direct Number: [phone] Fax: [phone]

## 2023-03-11 NOTE — Telephone Encounter (Signed)
Pharmacy Patient Advocate Encounter  Prior Authorization for REPATHA has been approved.    Effective dates: 03/11/23 through 03/10/24  Sydney Gross, CPhT Pharmacy Patient Advocate Specialist Direct Number: [phone] Fax: [phone]

## 2023-03-17 ENCOUNTER — Other Ambulatory Visit: Payer: Self-pay

## 2023-03-17 MED ORDER — ROSUVASTATIN CALCIUM 5 MG PO TABS: 5.0000 mg | ORAL_TABLET | ORAL | 3 refills | Status: DC

## 2023-03-18 ENCOUNTER — Ambulatory Visit: Payer: Medicare Other

## 2023-04-07 ENCOUNTER — Other Ambulatory Visit (HOSPITAL_COMMUNITY): Payer: Self-pay

## 2023-04-08 ENCOUNTER — Encounter: Payer: Self-pay | Admitting: Family Medicine

## 2023-04-14 ENCOUNTER — Ambulatory Visit: Payer: Medicare Other | Admitting: Pulmonary Disease

## 2023-04-14 ENCOUNTER — Encounter: Payer: Self-pay | Admitting: Pulmonary Disease

## 2023-04-14 VITALS — BP 116/68 | HR 78 | Ht 59.0 in | Wt 104.2 lb

## 2023-04-14 DIAGNOSIS — R0609 Other forms of dyspnea: Secondary | ICD-10-CM | POA: Diagnosis not present

## 2023-04-14 MED ORDER — BREZTRI AEROSPHERE 160-9-4.8 MCG/ACT IN AERO: 2.0000 | INHALATION_SPRAY | Freq: Two times a day (BID) | RESPIRATORY_TRACT | 11 refills | Status: DC | PRN

## 2023-04-14 NOTE — Patient Instructions (Addendum)
Nice to see you again  No changes in medicines  Return to clinic in 1 year or sooner if needed with Dr. Judeth Horn

## 2023-04-14 NOTE — Progress Notes (Signed)
@Patient  ID: Sydney Gross, female    DOB: 1930-01-03, 89 y.o.   MRN: [id number]  Chief Complaint  Patient presents with   Follow-up    Breathing is not the best  SOB     Referring provider: Lupita Raider, MD  HPI:   89 y.o. woman whom are seen in consultation for evaluation of dyspnea on exertion.  Most recent cardiology note reviewed.  Returns for follow-up.  Doing relatively well.  Still with some ongoing dyspnea.  He had pulmonary function test performed in the interim.  We reviewed these.  These are normal.  She does think the Breztri prescribed helped some.  Discussed role and rationale for aggressive treatment of this.  Discussed graduated exercise program etc.  Okay to be active.  HPI initial visit: Patient notes onset of cough back in the summer.  She was hospitalized July 2023.  CT scan at that time demonstrates no emphysema, scattered bronchiectasis right middle lobe and right lower lobe, thickened bronchioles, otherwise clear on my review and interpretation.  She was treated for "COPD exacerbation" with steroids.  Some concern for emphysema given wispy infiltrates felt to be atelectasis on my opinion.  She was given antibiotics.  Her cough gradually improved.  She was hospitalized again 09/2022 with GI bleeding.  Most recent hemoglobin in the nines.  Her cough is much improved.  Nearly gone.  He does have dyspnea exertion.  With inclines or stairs is worse.  She is limited in activity by musculoskeletal pain as well.  No times a day with things are better or worse.  No position make things better or worse.  No seasonal environmental factors she can identify to make things better or worse.  Has been using Breztri 2 puffs twice daily.  She feels like this has helped her symptoms.  Uses albuterol and DuoNebs with improvement albeit temporary.  No other alleviating or exacerbating factors.  Her most recent TTE 09/2019 through showed dilated left atrium, mild MVR, mild AI, mild  AS.  PMH: Atrial fibrillation, MVR, AI, AAS, hyperlipidemia, hypertension, GERD Surgical history: Hysterectomy appendectomy back surgery C-section cataract surgery cholecystectomy Family history: Father with emphysema, mother with CVA Social history: never smoker, lives in [place], lives with husband   Questionaires / Pulmonary Flowsheets:   ACT:      No data to display          MMRC:     No data to display          Epworth:      No data to display          Tests:   FENO:  No results found for: "NITRICOXIDE"  PFT:    Latest Ref Rng & Units 11/11/2022    2:56 PM  PFT Results  FVC-Pre L 1.23   FVC-Predicted Pre % 78   FVC-Post L 1.30   FVC-Predicted Post % 83   Pre FEV1/FVC % % 65   Post FEV1/FCV % % 69   FEV1-Pre L 0.80   FEV1-Predicted Pre % 71   FEV1-Post L 0.90   DLCO uncorrected ml/min/mmHg 11.56   DLCO UNC% % 75   DLCO corrected ml/min/mmHg 11.56   DLCO COR %Predicted % 75   DLVA Predicted % 87   TLC L 3.99   TLC % Predicted % 92   RV % Predicted % 105   Personally reviewed interpret as normal spirometry, no bronchodilator response, lung volumes within normal notes, DLCO within normal limits.  WALK:      No data to display          Imaging: Personally reviewed and as per EMR discussion of this note No results found.  Lab Results: Personally reviewed CBC     Component Value Date/Time   WBC 14.4 (H) 09/15/2022 0712   RBC 2.93 (L) 09/15/2022 0712   HGB 9.9 (L) 09/15/2022 0712   HGB 10.2 (L) 08/14/2021 0000   HCT 30.5 (L) 09/15/2022 0712   HCT 31.9 (L) 08/14/2021 0000   PLT 216 09/15/2022 0712   PLT 280 08/14/2021 0000   MCV 104.1 (H) 09/15/2022 0712   MCV 87 08/14/2021 0000   MCH 33.8 09/15/2022 0712   MCHC 32.5 09/15/2022 0712   RDW 14.0 09/15/2022 0712   RDW 15.8 (H) 08/14/2021 0000   LYMPHSABS 4.2 (H) 09/10/2022 1803   MONOABS 1.3 (H) 09/10/2022 1803   EOSABS 0.0 09/10/2022 1803   BASOSABS 0.0 09/10/2022 1803     BMET    Component Value Date/Time   NA 134 (L) 09/12/2022 0405   NA 142 08/14/2021 1634   K 3.9 09/12/2022 0405   CL 105 09/12/2022 0405   CO2 21 (L) 09/12/2022 0405   GLUCOSE 133 (H) 09/12/2022 0405   BUN 18 09/12/2022 0405   BUN 19 08/14/2021 1634   CREATININE 0.87 09/12/2022 0405   CALCIUM 7.1 (L) 09/12/2022 0405   GFRNONAA >60 09/12/2022 0405   GFRAA 36 (L) 01/29/2019 2115    BNP    Component Value Date/Time   BNP 70.4 08/16/2022 1637    ProBNP    Component Value Date/Time   PROBNP 645 08/14/2021 1634    Specialty Problems       Pulmonary Problems   Dyspnea on exertion   COPD (chronic obstructive pulmonary disease) (HCC)   COPD with acute exacerbation (HCC)    Allergies  Allergen Reactions   Brilinta [Ticagrelor] Shortness Of Breath   Codeine Nausea And Vomiting   Darvon [Propoxyphene] Nausea Only   Lipitor [Atorvastatin] Other (See Comments)    MYALGIAS   Rosuvastatin Other (See Comments)    MYALGIAS    Simvastatin Other (See Comments)    MYALGIAS    Other Other (See Comments)   Tramadol Hcl Nausea Only    nausea (08/2019)    Immunization History  Administered Date(s) Administered   Fluad Quad(high Dose 65+) 09/15/2022   Influenza,inj,Quad PF,6+ Mos 12/11/2013   PFIZER(Purple Top)SARS-COV-2 Vaccination 12/26/2019, 01/13/2020   Tdap 11/08/2022    Past Medical History:  Diagnosis Date   Allergic rhinitis    Arthritis    "maybe a little in my knees and back" (07/17/2018)   Chronic lower back pain    Colitis 12/2013   "never had this before"   COPD (chronic obstructive pulmonary disease) (HCC)    "questionable/Dr. Katrinka Blazing" (07/17/2018)   Coronary artery disease    GI bleed    H/O calcium pyrophosphate deposition disease (CPPD)    Hiatal hernia    Hyperlipidemia    Hypertension    Mild mitral regurgitation    On home oxygen therapy    "I've used it 2 times in 2 yrs" (07/17/2018)   PAD (peripheral artery disease) (HCC)    PAF  (paroxysmal atrial fibrillation) (HCC)    Pulmonary hypertension (HCC)    Shingles     Tobacco History: Social History   Tobacco Use  Smoking Status Never   Passive exposure: Past  Smokeless Tobacco Never   Counseling given: Not Answered  Continue to not smoke  Outpatient Encounter Medications as of 04/14/2023  Medication Sig   albuterol (PROVENTIL) (2.5 MG/3ML) 0.083% nebulizer solution Take 3 mLs (2.5 mg total) by nebulization every 2 (two) hours as needed for wheezing or shortness of breath.   albuterol (VENTOLIN HFA) 108 (90 Base) MCG/ACT inhaler Inhale 1 puff into the lungs every 6 (six) hours as needed for shortness of breath.   apixaban (ELIQUIS) 2.5 MG TABS tablet Take 1 tablet (2.5 mg total) by mouth 2 (two) times daily.   denosumab (PROLIA) 60 MG/ML SOSY injection Inject 60 mg as directed every 6 (six) months.   diphenhydramine-acetaminophen (TYLENOL PM) 25-500 MG TABS tablet Take 1 tablet by mouth at bedtime.   Evolocumab (REPATHA SURECLICK) 140 MG/ML SOAJ INJECT 1 PEN INTO THE SKIN EVERY 14 DAYS (Patient taking differently: Inject 140 mg into the skin every 14 (fourteen) days.)   ezetimibe (ZETIA) 10 MG tablet Take 1 tablet (10 mg total) by mouth daily.   ipratropium-albuterol (DUONEB) 0.5-2.5 (3) MG/3ML SOLN Take 3 mLs by nebulization every 6 (six) hours as needed.   lisinopril-hydrochlorothiazide (ZESTORETIC) 20-25 MG tablet Take 0.5 tablets by mouth in the morning. (Patient taking differently: Take 0.5 tablets by mouth daily.)   Magnesium Cl-Calcium Carbonate (SLOW-MAG PO) Take 1 tablet by mouth 2 (two) times daily.   metoprolol succinate (TOPROL-XL) 25 MG 24 hr tablet Take 1 tablet (25 mg total) by mouth daily.   metoprolol tartrate (LOPRESSOR) 25 MG tablet Take one half - one tablet (12.5 mg - 25 mg) as needed for Heart Rate 100 or above.   nitroGLYCERIN (NITROSTAT) 0.4 MG SL tablet Place 0.4 mg under the tongue every 5 (five) minutes as needed for chest pain.    rosuvastatin (CRESTOR) 5 MG tablet Take 1 tablet (5 mg total) by mouth 3 (three) times a week.   tiZANidine (ZANAFLEX) 2 MG tablet Take 2 mg by mouth at bedtime.   [DISCONTINUED] Budeson-Glycopyrrol-Formoterol (BREZTRI AEROSPHERE) 160-9-4.8 MCG/ACT AERO Inhale 2 puffs into the lungs 2 (two) times daily as needed (shortness of breath/ COPD).   Budeson-Glycopyrrol-Formoterol (BREZTRI AEROSPHERE) 160-9-4.8 MCG/ACT AERO Inhale 2 puffs into the lungs 2 (two) times daily as needed (shortness of breath/ COPD).   No facility-administered encounter medications on file as of 04/14/2023.     Review of Systems  Review of Systems  N/a Physical Exam  BP 116/68 (BP Location: Left Arm, Patient Position: Sitting, Cuff Size: Normal)   Pulse 78   Ht 4\' 11"  (1.499 m)   Wt 104 lb 3.2 oz (47.3 kg)   SpO2 95%   BMI 21.05 kg/m   Wt Readings from Last 5 Encounters:  04/14/23 104 lb 3.2 oz (47.3 kg)  01/18/23 105 lb 9.6 oz (47.9 kg)  11/08/22 105 lb 9.6 oz (47.9 kg)  10/19/22 105 lb 9.6 oz (47.9 kg)  09/14/22 100 lb 5 oz (45.5 kg)    BMI Readings from Last 5 Encounters:  04/14/23 21.05 kg/m  01/18/23 21.33 kg/m  11/08/22 21.33 kg/m  10/19/22 21.33 kg/m  09/14/22 20.26 kg/m     Physical Exam General: Sitting in chair, appears younger than stated age Eyes: EOMI, no icterus Neck: Supple, no JVP Pulmonary: Clear, normal work of breathing Cardiovascular: warm, no edema Abdomen: Non-distended, bowel sounds present MSK: no synovitis, no joint effusion Neuro: Normal gait, no weakness Psych: Normal mood, full affect  Assessment & Plan:   DOE: Multifactorial related to deconditioning in setting of multiple hospitalizations in 2023, cardiac contributors of  MVR, AI, AS with LA dilation with worsening in setting of normal elevation in BP with exertion, asthma. PFT normal, possible asthma but major driver of symptoms is nonpulmonary.   Recommend graduated exercise program.  Sounds like MSK issues,  leg pain, feet pain are limiting factors.  Asthma: Continue Breztri, PRN bronchodilators. Duonebs refilled today.   Bronchiectasis: Scattered, mild. Suspect sequelae of prior infections. Denies GERD or aspiration symptoms. Consider early antibiotic therapy is cough worsens.  Cough: Now improved. Suspect related to possible viral illness or asthma flare earlier in 2023.  Return in about 1 year (around 04/13/2024).   Karren Burly, MD 04/14/2023

## 2023-05-20 DIAGNOSIS — I10 Essential (primary) hypertension: Secondary | ICD-10-CM | POA: Diagnosis not present

## 2023-05-20 DIAGNOSIS — J449 Chronic obstructive pulmonary disease, unspecified: Secondary | ICD-10-CM | POA: Diagnosis not present

## 2023-05-20 DIAGNOSIS — I2510 Atherosclerotic heart disease of native coronary artery without angina pectoris: Secondary | ICD-10-CM | POA: Diagnosis not present

## 2023-05-20 DIAGNOSIS — E782 Mixed hyperlipidemia: Secondary | ICD-10-CM | POA: Diagnosis not present

## 2023-07-06 ENCOUNTER — Other Ambulatory Visit: Payer: Self-pay | Admitting: Pharmacist

## 2023-07-06 DIAGNOSIS — I25118 Atherosclerotic heart disease of native coronary artery with other forms of angina pectoris: Secondary | ICD-10-CM

## 2023-07-06 DIAGNOSIS — I70219 Atherosclerosis of native arteries of extremities with intermittent claudication, unspecified extremity: Secondary | ICD-10-CM

## 2023-07-06 DIAGNOSIS — E785 Hyperlipidemia, unspecified: Secondary | ICD-10-CM

## 2023-07-06 MED ORDER — REPATHA SURECLICK 140 MG/ML ~~LOC~~ SOAJ: SUBCUTANEOUS | 11 refills | Status: DC

## 2023-07-08 IMAGING — CR DG CHEST 2V
2 series · 2 of 2 positions shown · non-contrast
Comparison: 01/29/2019

CLINICAL DATA: 89-year-old female with shortness of breath

EXAM:
CHEST - 2 VIEW

[w chest pa *]
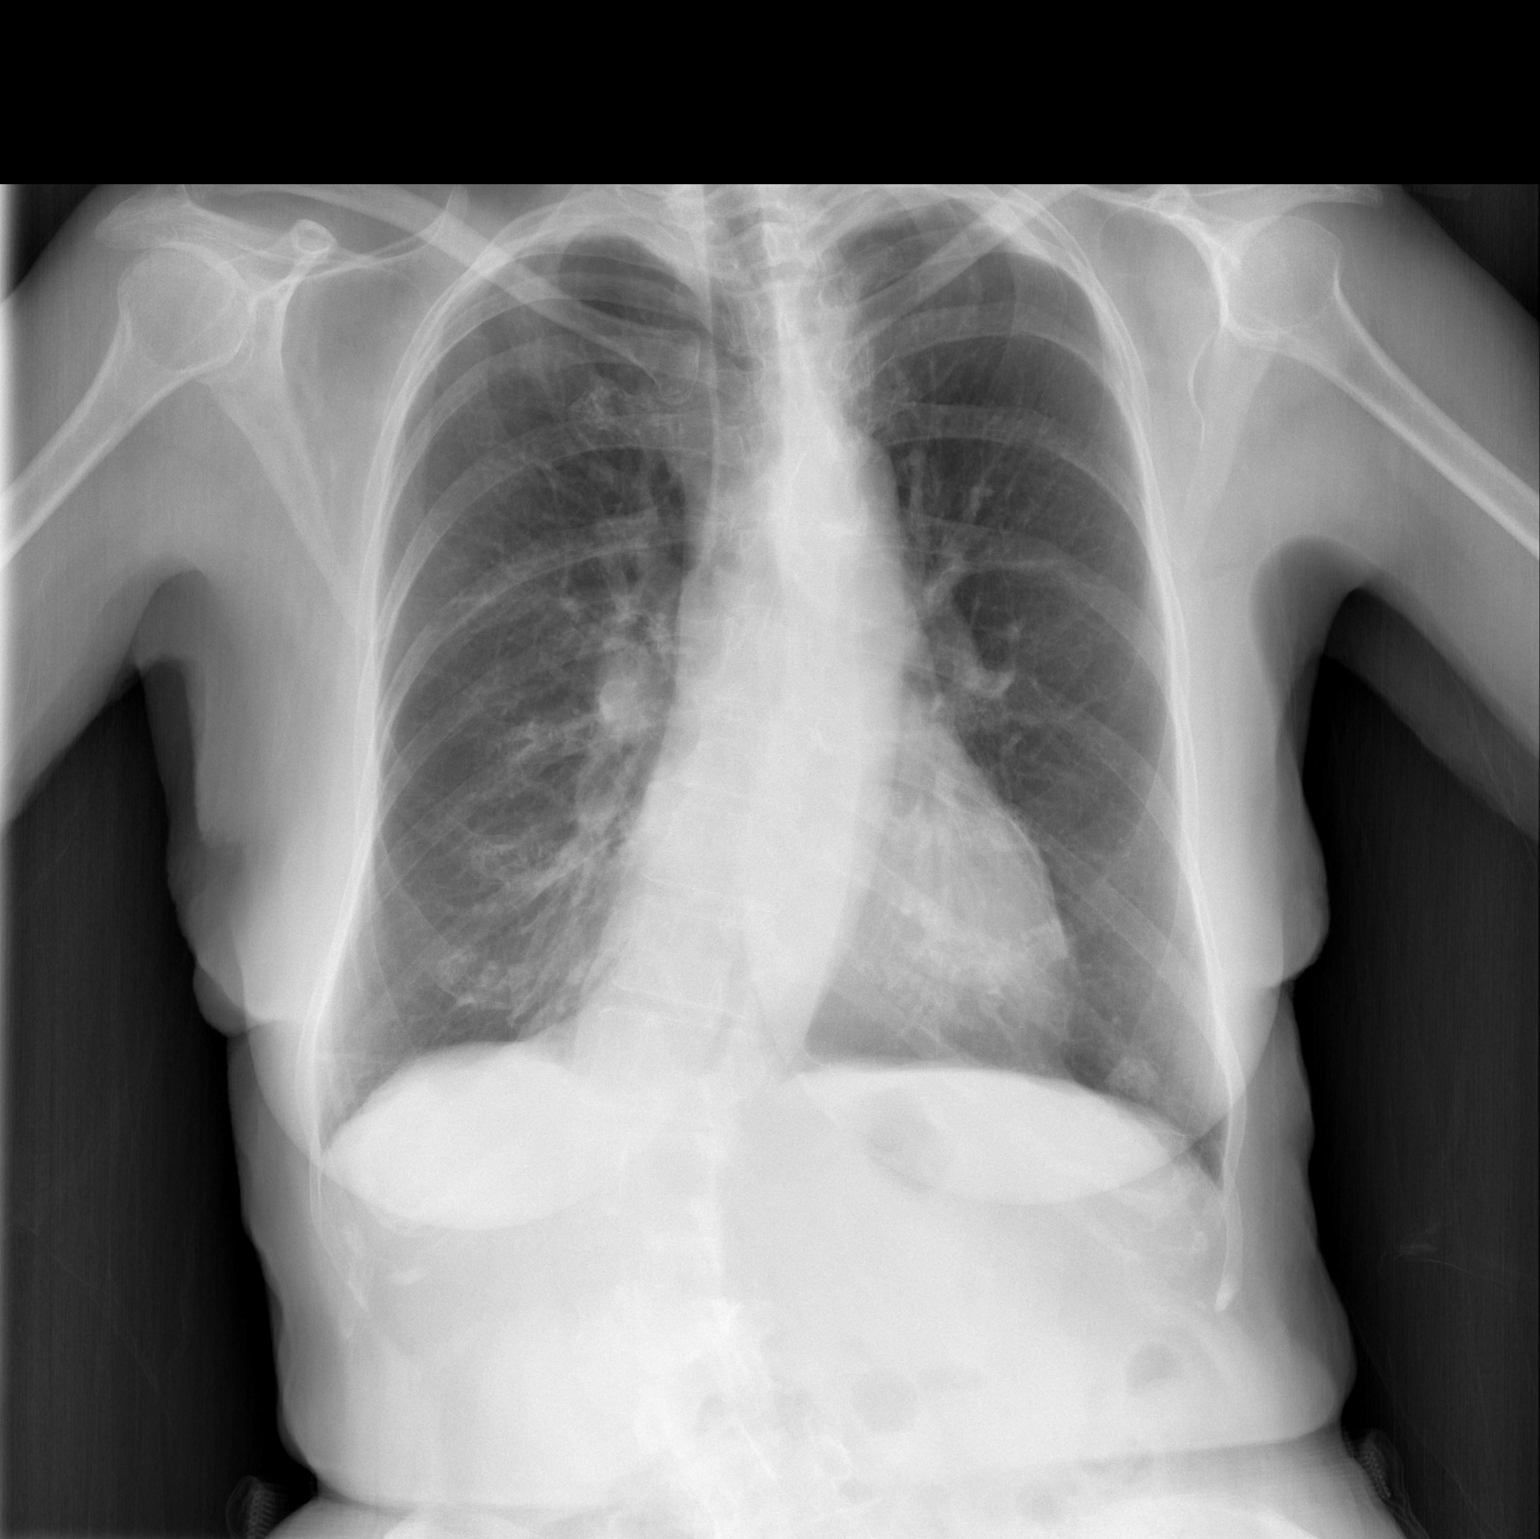

[w chest lat *]
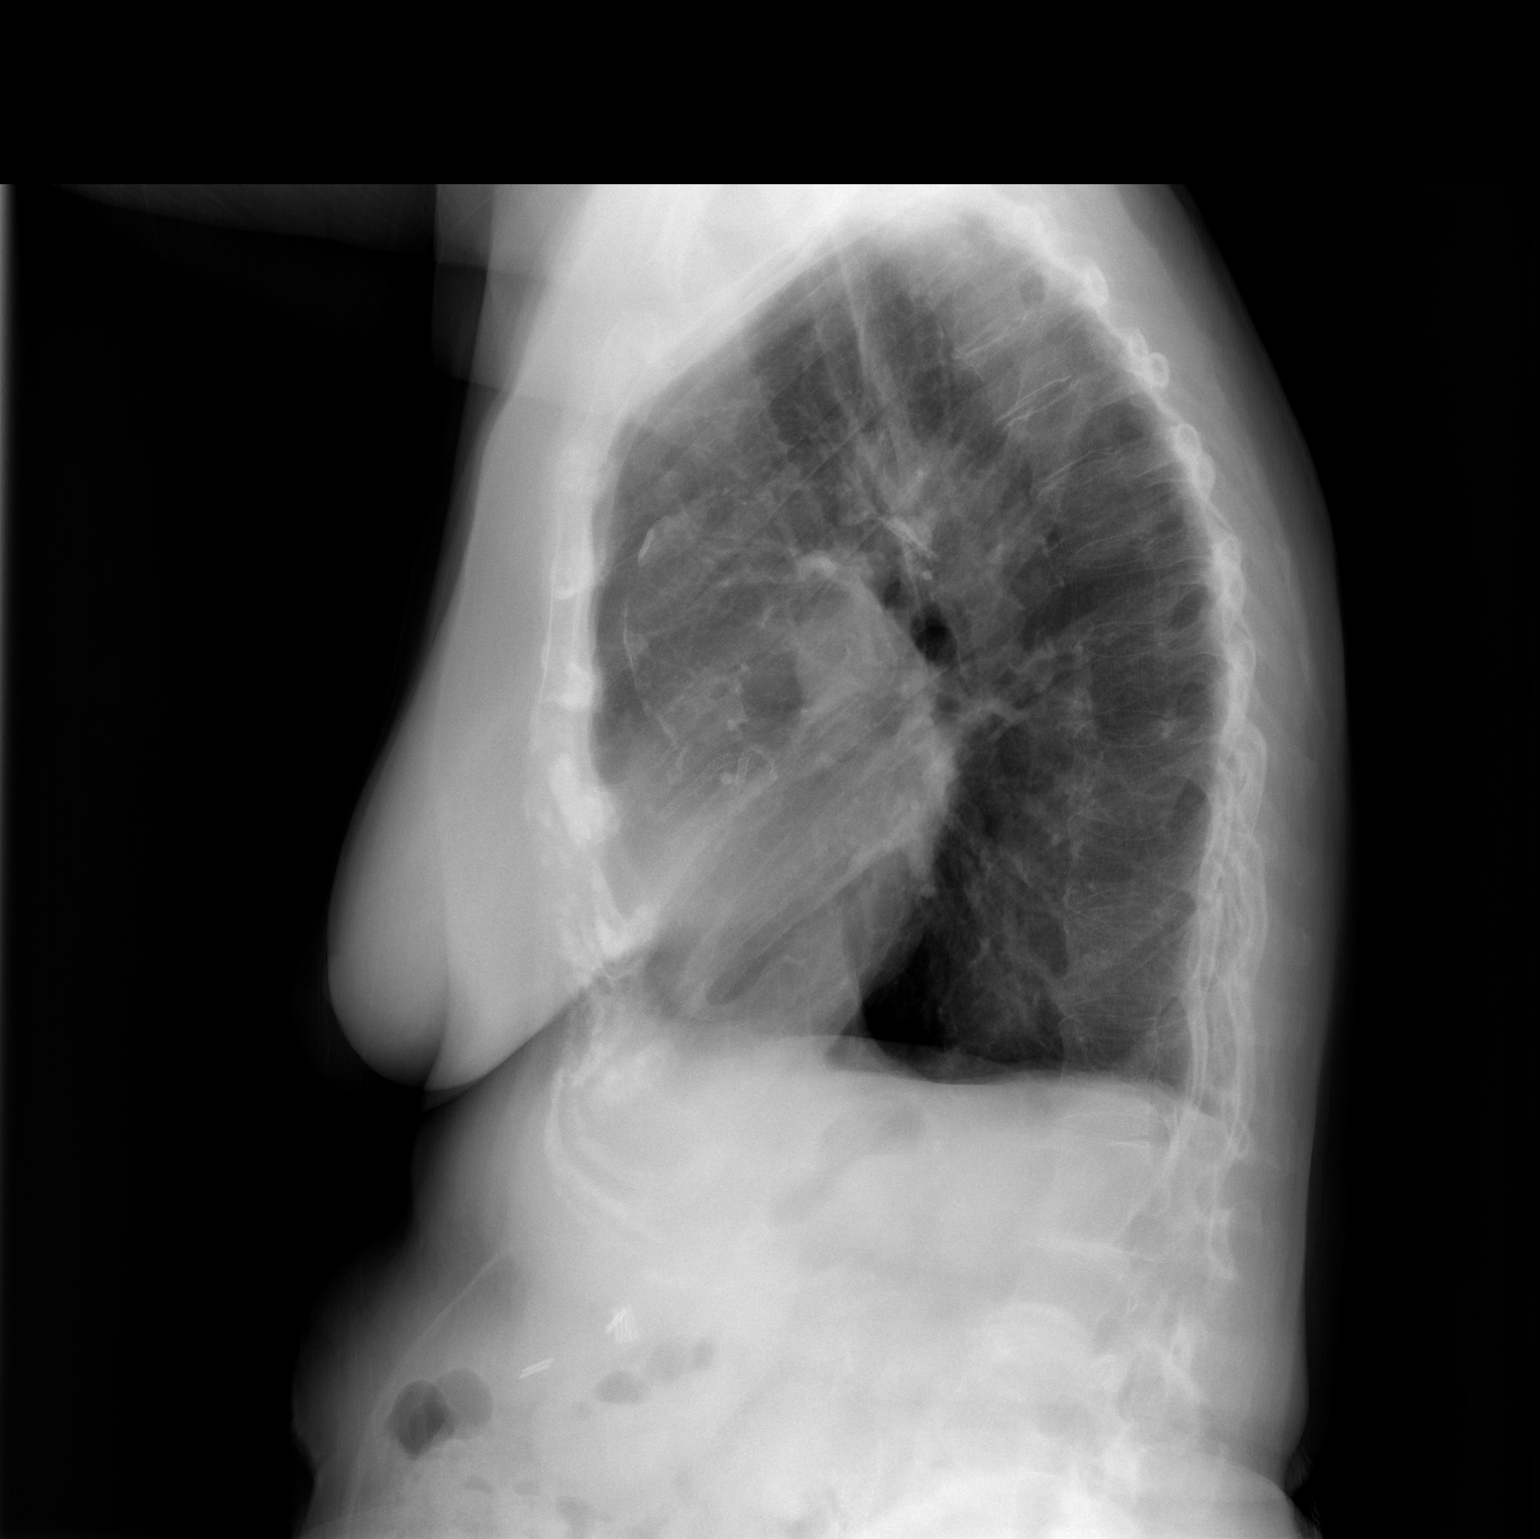

[2 of 2 positions shown; findings below may reference images not displayed]

FINDINGS: Cardiomediastinal silhouette unchanged in size and contour. No
evidence of central vascular congestion. No interlobular septal
thickening.

Stigmata of emphysema, with increased retrosternal airspace,
flattened hemidiaphragms, increased AP diameter, and hyperinflation
on the AP view.

Density at the left lung base, corresponds to the tip of the lower
thoracic rib and not a lung nodule.

Pleural-parenchymal thickening at the apices of the lungs, similar
to the prior. Coarsened interstitial markings, with no confluent
airspace disease.

Aortic calcifications.  Evidence of prior PTCA/coronary disease.

No pneumothorax or pleural effusion.

No acute displaced fracture. Degenerative changes of the spine.

Scoliotic curvature again noted
IMPRESSION: Emphysema without evidence of superimposed acute cardiopulmonary
disease.

## 2023-07-11 DIAGNOSIS — I5032 Chronic diastolic (congestive) heart failure: Secondary | ICD-10-CM | POA: Diagnosis not present

## 2023-07-11 DIAGNOSIS — M810 Age-related osteoporosis without current pathological fracture: Secondary | ICD-10-CM | POA: Diagnosis not present

## 2023-07-11 DIAGNOSIS — E559 Vitamin D deficiency, unspecified: Secondary | ICD-10-CM | POA: Diagnosis not present

## 2023-07-11 DIAGNOSIS — I2510 Atherosclerotic heart disease of native coronary artery without angina pectoris: Secondary | ICD-10-CM | POA: Diagnosis not present

## 2023-07-11 DIAGNOSIS — I130 Hypertensive heart and chronic kidney disease with heart failure and stage 1 through stage 4 chronic kidney disease, or unspecified chronic kidney disease: Secondary | ICD-10-CM | POA: Diagnosis not present

## 2023-07-11 DIAGNOSIS — J42 Unspecified chronic bronchitis: Secondary | ICD-10-CM | POA: Diagnosis not present

## 2023-07-11 DIAGNOSIS — N183 Chronic kidney disease, stage 3 unspecified: Secondary | ICD-10-CM | POA: Diagnosis not present

## 2023-07-11 DIAGNOSIS — I10 Essential (primary) hypertension: Secondary | ICD-10-CM | POA: Diagnosis not present

## 2023-07-11 DIAGNOSIS — E782 Mixed hyperlipidemia: Secondary | ICD-10-CM | POA: Diagnosis not present

## 2023-07-11 DIAGNOSIS — I700 Atherosclerosis of aorta: Secondary | ICD-10-CM | POA: Diagnosis not present

## 2023-07-11 LAB — LAB REPORT - SCANNED: EGFR: 54

## 2023-07-16 ENCOUNTER — Other Ambulatory Visit: Payer: Self-pay | Admitting: Cardiology

## 2023-07-18 NOTE — Telephone Encounter (Signed)
Prescription refill request for Eliquis received. Indication:afib Last office visit:2/24 Age: 89 Weight:47.3  kg  Prescription refilled

## 2023-08-26 IMAGING — DX DG CHEST 2V
2 series · 2 of 2 positions shown · non-contrast
Comparison: Chest x-ray dated August 17, 2021.

CLINICAL DATA: Chronic intermittent shortness of breath.

EXAM:
CHEST - 2 VIEW

[chest pa]
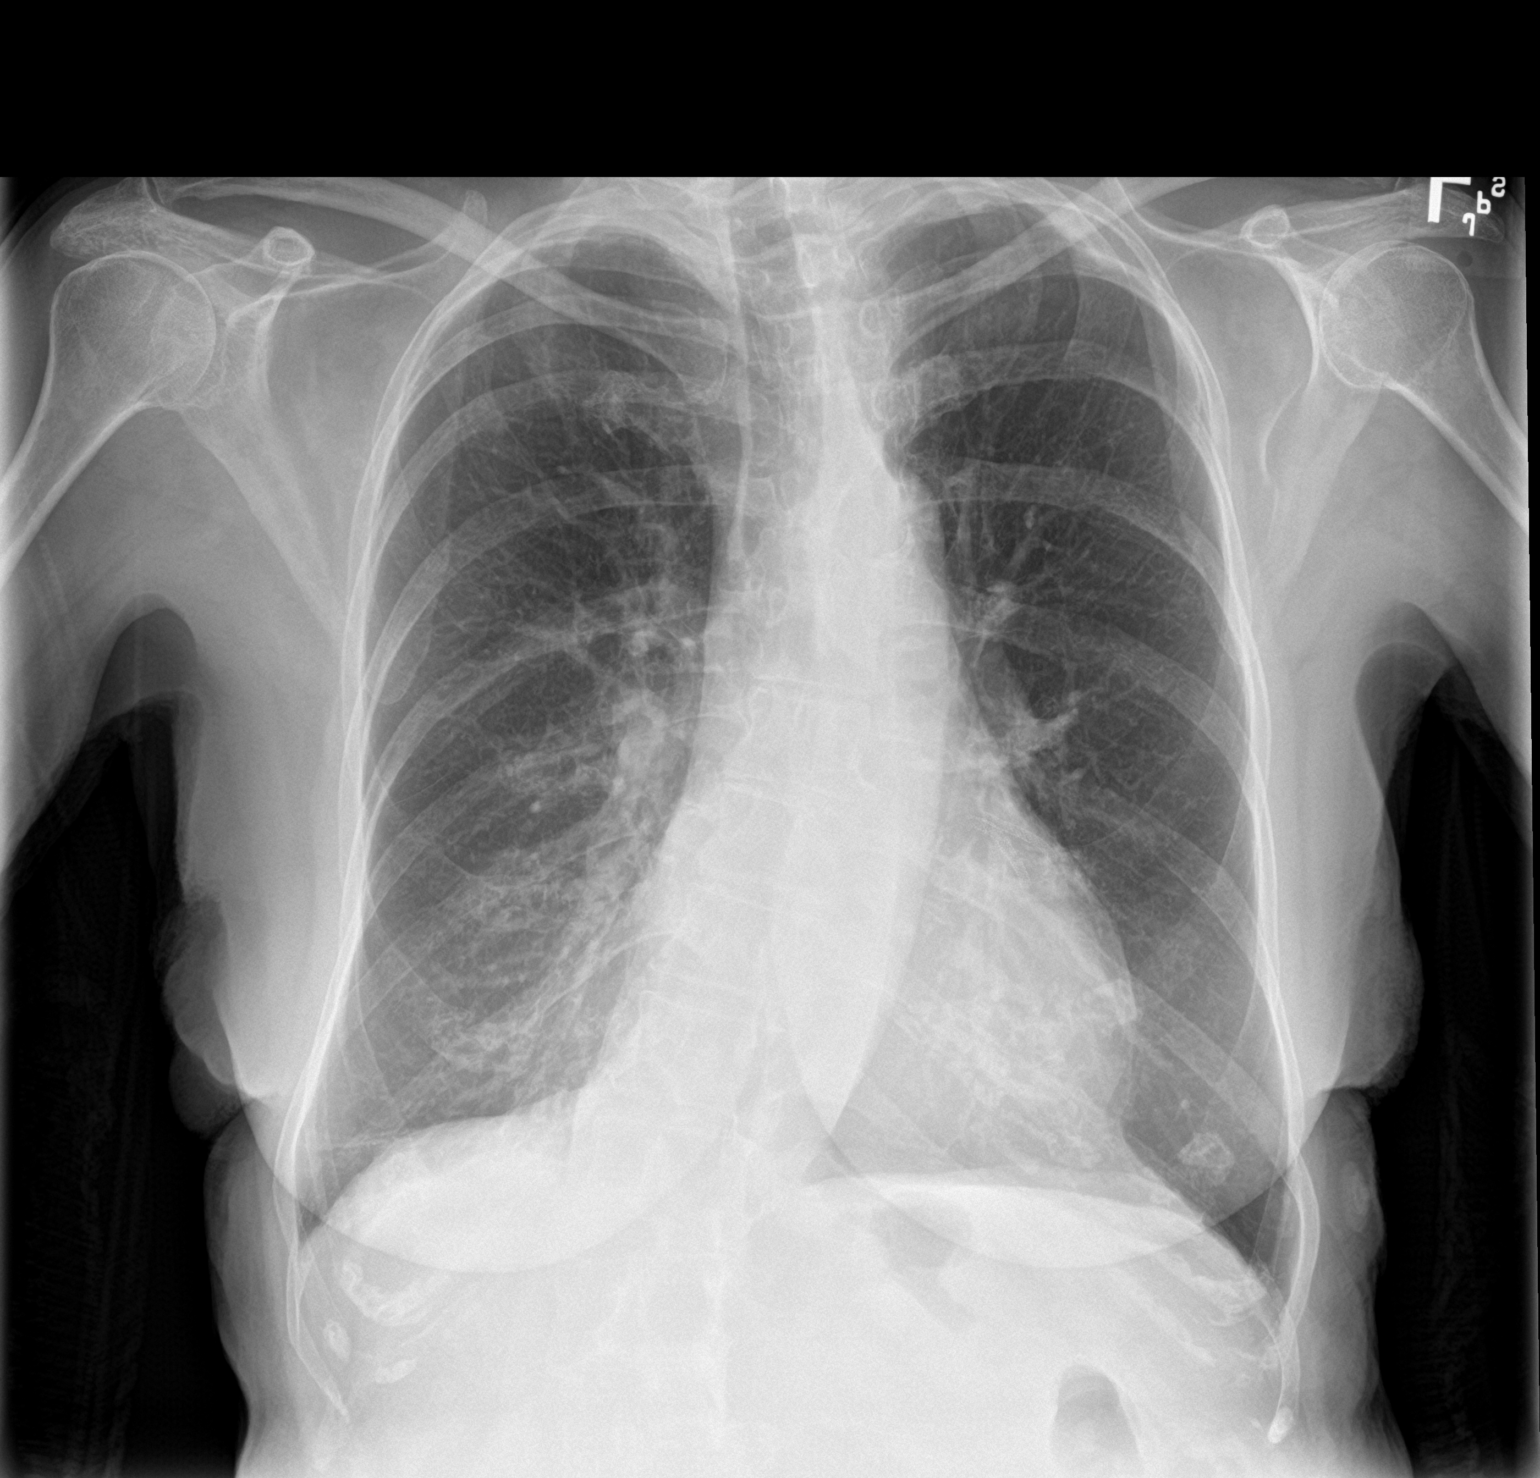

[chest lat]
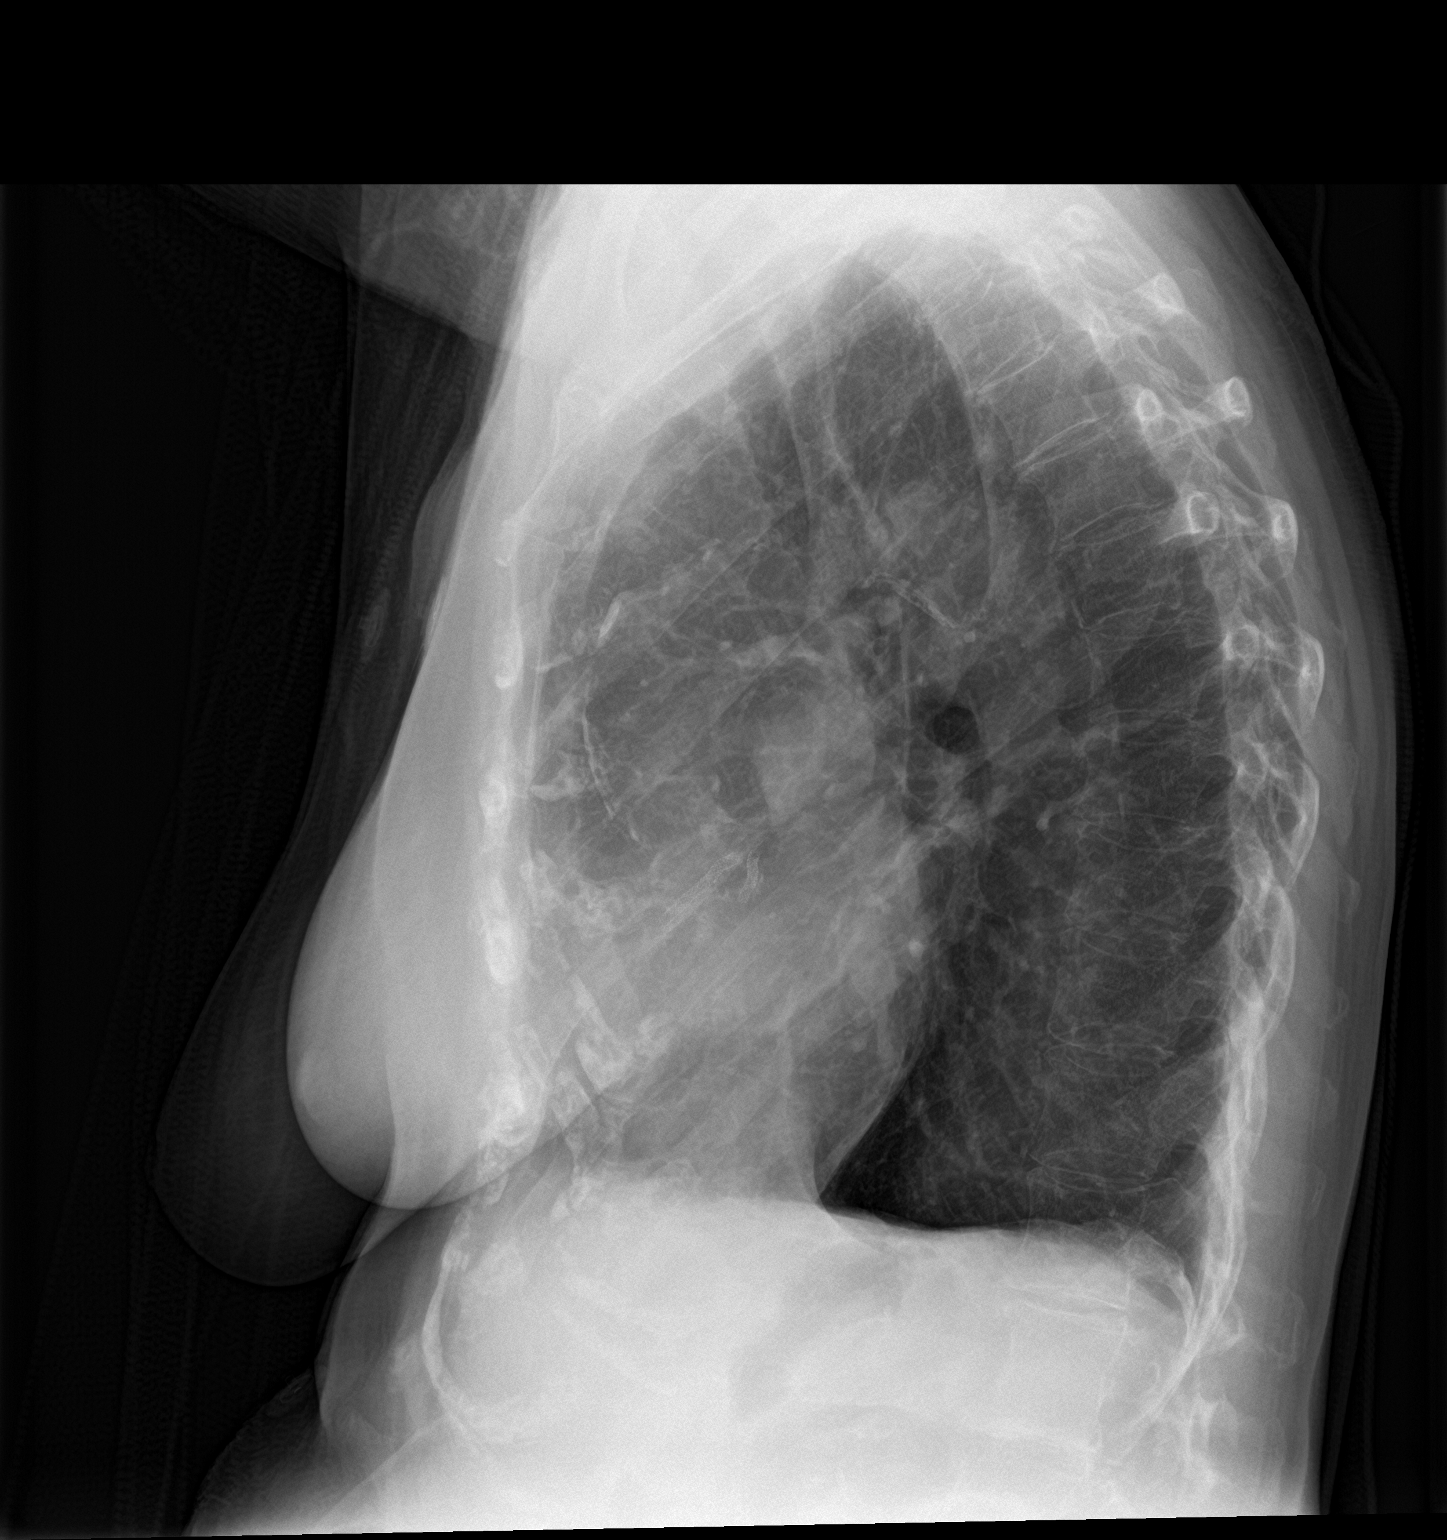

[2 of 2 positions shown; findings below may reference images not displayed]

FINDINGS: The heart size and mediastinal contours are within normal limits.
Normal pulmonary vascularity. No focal consolidation, pleural
effusion, or pneumothorax. The lungs remain hyperinflated. No acute
osseous abnormality.
IMPRESSION: 1. No acute cardiopulmonary disease.

## 2023-08-26 IMAGING — CT CT ABD-PELV W/ CM
2 of 5 series · 16 of 46 positions shown, 18 images · IV contrast (Omni 300)
Comparison: CT abdomen and pelvis 04/19/2021.

CLINICAL DATA: Acute left-sided abdominal pain.

EXAM:
CT ABDOMEN AND PELVIS WITH CONTRAST
TECHNIQUE: Multidetector CT imaging of the abdomen and pelvis was performed
using the standard protocol following bolus administration of
intravenous contrast.
CONTRAST:  80mL OMNIPAQUE IOHEXOL 300 MG/ML  SOLN

[Series 4: a/p w/ 5mm · axial · 0.68mm/px · z∈[+924,+1229]mm · 13 of 69 slices shown, 15 images]
[im 4/69  soft-tissue]
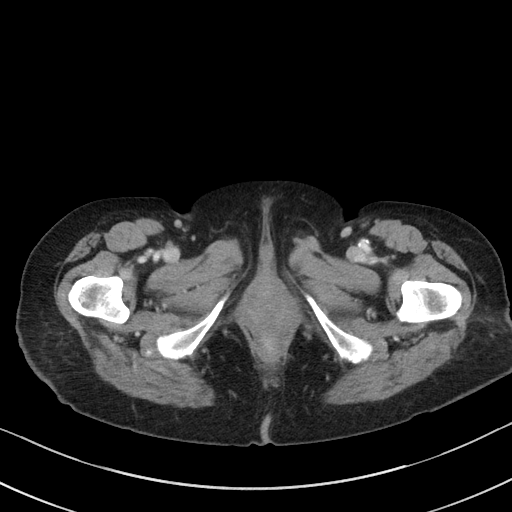
[im 4/69  bone]
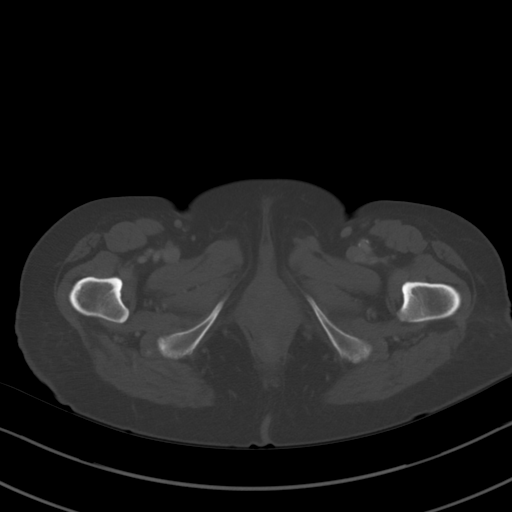
[im 11/69  soft-tissue]
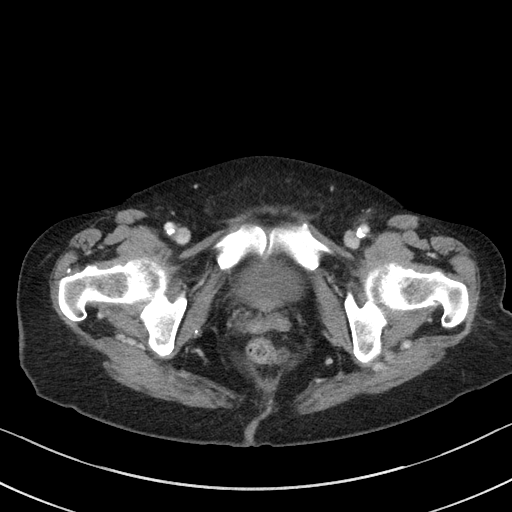
[im 15/69  soft-tissue]
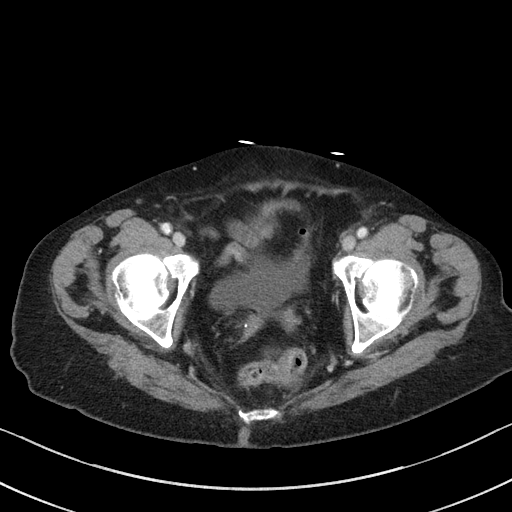
[im 18/69  soft-tissue]
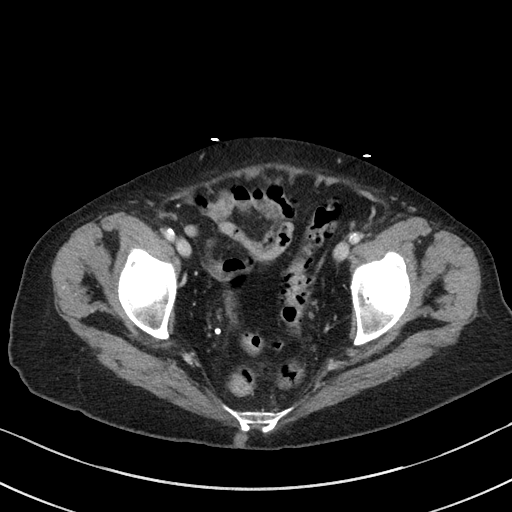
[im 26/69  soft-tissue]
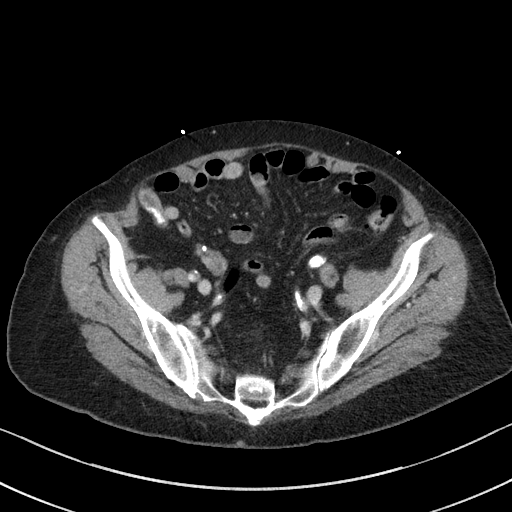
[im 29/69  soft-tissue]
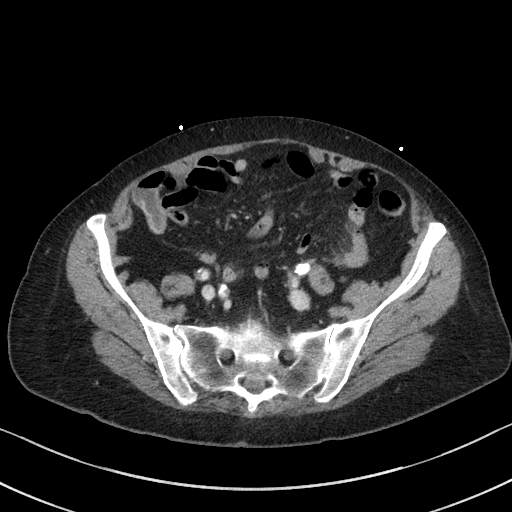
[im 36/69  soft-tissue]
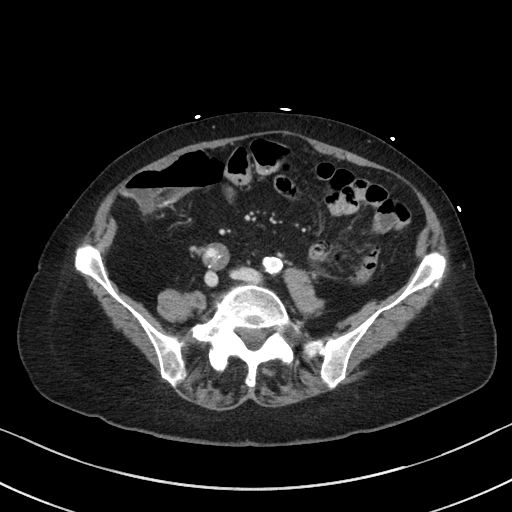
[im 40/69  soft-tissue]
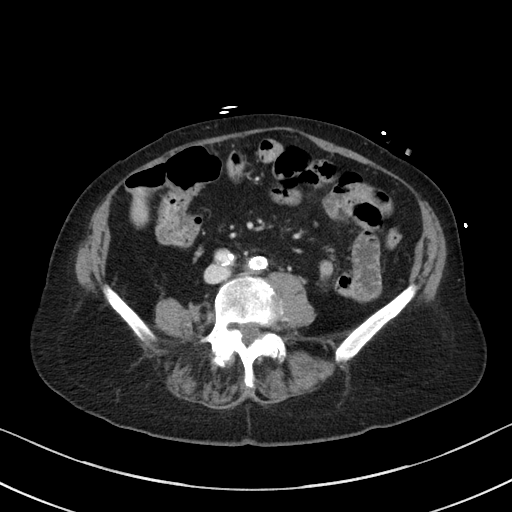
[im 43/69  soft-tissue]
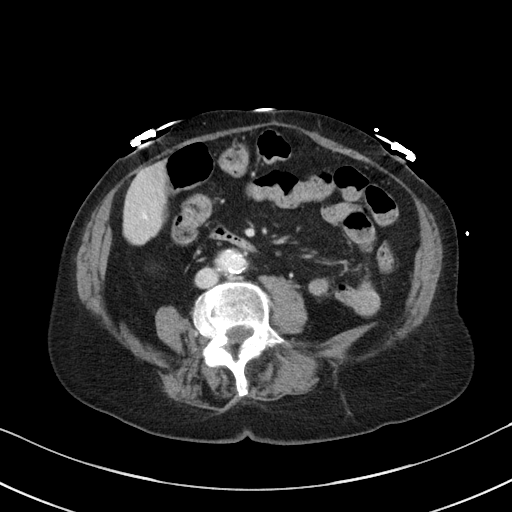
[im 43/69  bone]
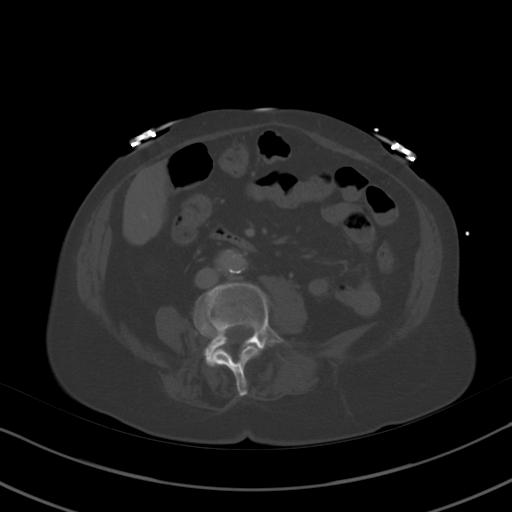
[im 51/69  soft-tissue]
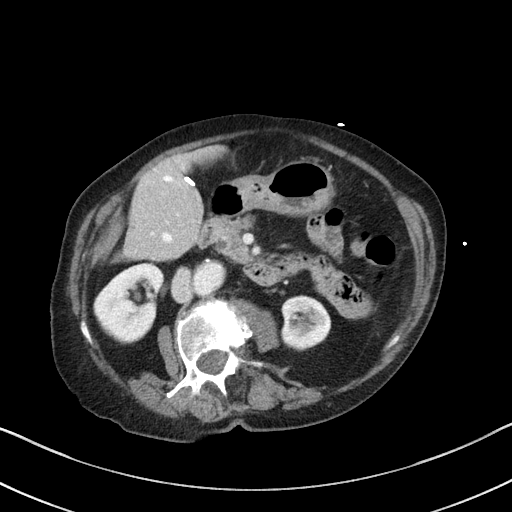
[im 54/69  soft-tissue]
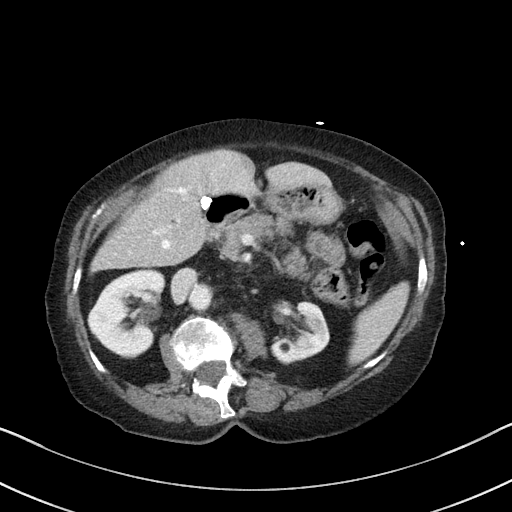
[im 58/69  soft-tissue]
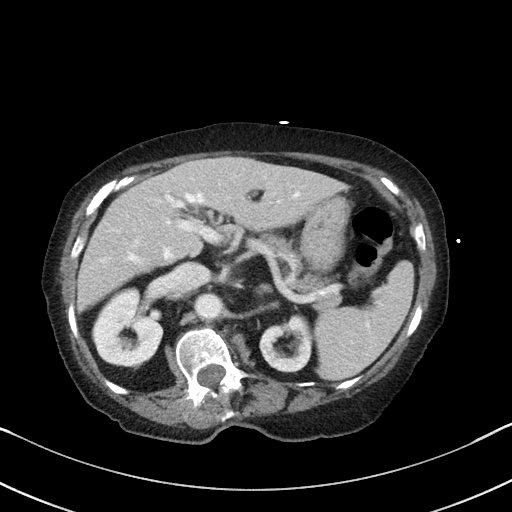
[im 65/69  soft-tissue]
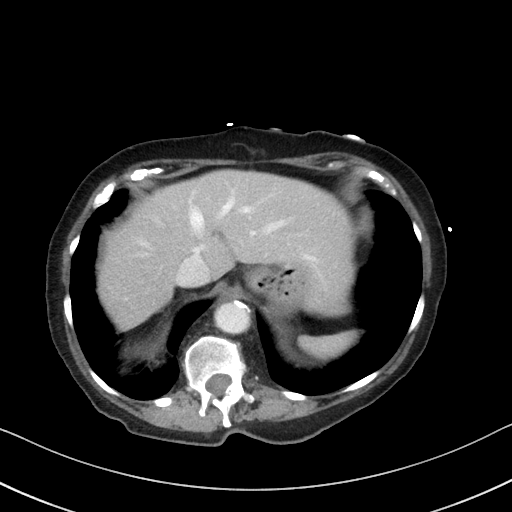

[Series 8: a/p w/ cor · coronal · 0.67mm/px · 3 of 116 slices shown]
[im 39/116  soft-tissue]
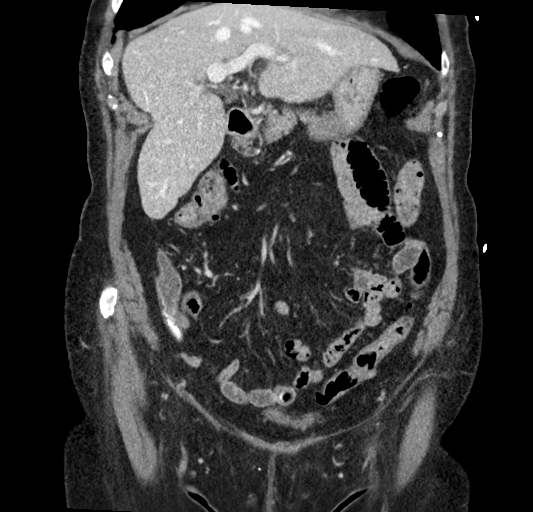
[im 52/116  soft-tissue]
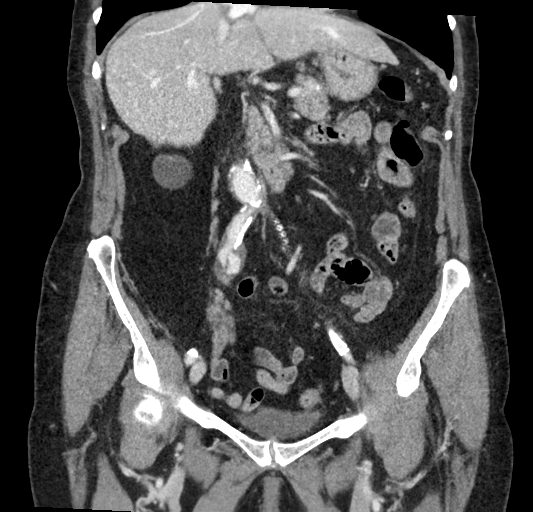
[im 64/116  soft-tissue]
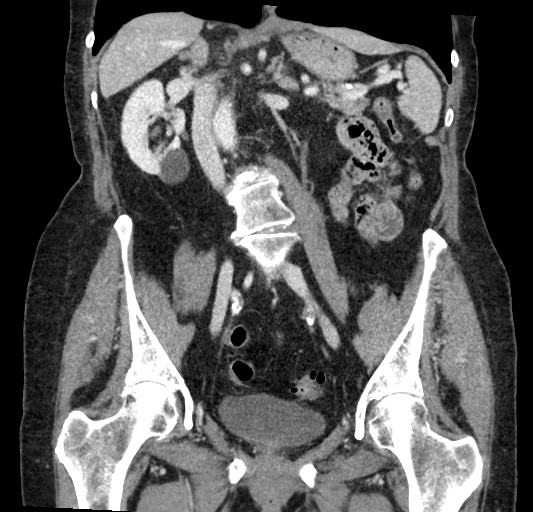

[16 of 46 positions shown; findings below may reference images not displayed]

FINDINGS: Lower chest: No acute abnormality.

Hepatobiliary: No focal liver abnormality is seen. Status post
cholecystectomy. No biliary dilatation.

Pancreas: Unremarkable. No pancreatic ductal dilatation or
surrounding inflammatory changes.

Spleen: Normal in size without focal abnormality.

Adrenals/Urinary Tract: There is mild left renal atrophy, unchanged.
There is no hydronephrosis or perinephric fluid. Bilateral renal
cysts are similar to the prior exam. The largest cyst is in the
inferior pole the right kidney measuring 2.8 cm. The adrenal glands
and bladder are within normal limits.

Stomach/Bowel: Stomach is within normal limits. No evidence of bowel
wall thickening, distention, or inflammatory changes. There is
colonic diverticulosis without evidence for acute diverticulitis.
The appendix is likely surgically absent.

Vascular/Lymphatic: There is no evidence for aortic aneurysm. There
is a new left iliac artery stent in place. There is atherosclerotic
calcification throughout the aorta and iliac arteries. There is
aneurysmal dilatation of the right common iliac artery measuring 18
mm (previously 16 mm). There is a new area of focal dissection
versus ulcerated plaque at this level. There is no surrounding
inflammatory stranding or fluid.

Reproductive: Status post hysterectomy. No adnexal masses.

Other: No abdominal wall hernia or abnormality. No abdominopelvic
ascites.

Musculoskeletal: There is scoliosis and degenerative change
throughout the spine similar to the prior study.
IMPRESSION: 1. Aneurysmal dilatation of the right common iliac artery has mildly
increased in size. There is new focal dissection versus ulcerated
plaque at this level. Recommend clinical correlation and follow-up.
No surrounding hematoma.
2. Colonic diverticulosis without evidence for acute diverticulitis.
3.  Aortic Atherosclerosis ([id number]).

## 2023-11-11 DIAGNOSIS — D6869 Other thrombophilia: Secondary | ICD-10-CM | POA: Diagnosis not present

## 2023-12-09 ENCOUNTER — Emergency Department (HOSPITAL_COMMUNITY): Payer: Medicare Other

## 2023-12-09 ENCOUNTER — Encounter (HOSPITAL_COMMUNITY): Payer: Self-pay

## 2023-12-09 ENCOUNTER — Other Ambulatory Visit: Payer: Self-pay

## 2023-12-09 ENCOUNTER — Emergency Department (HOSPITAL_COMMUNITY)
Admission: EM | Admit: 2023-12-09 | Discharge: 2023-12-09 | Disposition: A | Discharge status: Home / Self Care | Payer: Medicare Other | Attending: Emergency Medicine | Admitting: Emergency Medicine

## 2023-12-09 DIAGNOSIS — Z20822 Contact with and (suspected) exposure to covid-19: Secondary | ICD-10-CM | POA: Diagnosis not present

## 2023-12-09 DIAGNOSIS — R509 Fever, unspecified: Secondary | ICD-10-CM | POA: Diagnosis not present

## 2023-12-09 DIAGNOSIS — R059 Cough, unspecified: Secondary | ICD-10-CM | POA: Diagnosis not present

## 2023-12-09 DIAGNOSIS — R0602 Shortness of breath: Secondary | ICD-10-CM | POA: Diagnosis not present

## 2023-12-09 DIAGNOSIS — J069 Acute upper respiratory infection, unspecified: Secondary | ICD-10-CM | POA: Diagnosis not present

## 2023-12-09 DIAGNOSIS — R079 Chest pain, unspecified: Secondary | ICD-10-CM | POA: Diagnosis not present

## 2023-12-09 DIAGNOSIS — Z7901 Long term (current) use of anticoagulants: Secondary | ICD-10-CM | POA: Diagnosis not present

## 2023-12-09 DIAGNOSIS — J449 Chronic obstructive pulmonary disease, unspecified: Secondary | ICD-10-CM | POA: Diagnosis not present

## 2023-12-09 DIAGNOSIS — I10 Essential (primary) hypertension: Secondary | ICD-10-CM | POA: Insufficient documentation

## 2023-12-09 DIAGNOSIS — Z79899 Other long term (current) drug therapy: Secondary | ICD-10-CM | POA: Diagnosis not present

## 2023-12-09 DIAGNOSIS — R197 Diarrhea, unspecified: Secondary | ICD-10-CM | POA: Diagnosis not present

## 2023-12-09 DIAGNOSIS — R0789 Other chest pain: Secondary | ICD-10-CM | POA: Diagnosis not present

## 2023-12-09 LAB — BASIC METABOLIC PANEL
Anion gap: 12 (ref 5–15)
BUN: 31 mg/dL — ABNORMAL HIGH (ref 8–23)
CO2: 20 mmol/L — ABNORMAL LOW (ref 22–32)
Calcium: 10.3 mg/dL (ref 8.9–10.3)
Chloride: 106 mmol/L (ref 98–111)
Creatinine, Ser: 0.79 mg/dL (ref 0.44–1.00)
GFR, Estimated: >60 mL/min (ref >60)
Glucose, Bld: 104 mg/dL — ABNORMAL HIGH (ref 70–99)
Potassium: 4.3 mmol/L (ref 3.5–5.1)
Sodium: 138 mmol/L (ref 135–145)

## 2023-12-09 LAB — CBC
HCT: 39 % (ref 36.0–46.0)
Hemoglobin: 12.3 g/dL (ref 12.0–15.0)
MCH: 31.8 pg (ref 26.0–34.0)
MCHC: 31.5 g/dL (ref 30.0–36.0)
MCV: 100.8 fL — ABNORMAL HIGH (ref 80.0–100.0)
Platelets: 243 10*3/uL (ref 150–400)
RBC: 3.87 MIL/uL (ref 3.87–5.11)
RDW: 12.7 % (ref 11.5–15.5)
WBC: 9.4 10*3/uL (ref 4.0–10.5)
nRBC: 0 % (ref 0.0–0.2)

## 2023-12-09 LAB — RESP PANEL BY RT-PCR (RSV, FLU A&B, COVID)  RVPGX2
Influenza A by PCR: POSITIVE — AB
Influenza B by PCR: NEGATIVE
Resp Syncytial Virus by PCR: NEGATIVE
SARS Coronavirus 2 by RT PCR: NEGATIVE

## 2023-12-09 LAB — TROPONIN I (HIGH SENSITIVITY): Troponin I (High Sensitivity): 9 ng/L (ref <18)

## 2023-12-09 MED ORDER — BENZONATATE 100 MG PO CAPS: 100.0000 mg | ORAL_CAPSULE | Freq: Three times a day (TID) | ORAL | 0 refills | Status: AC

## 2023-12-09 MED ORDER — ONDANSETRON 8 MG PO TBDP: 8.0000 mg | ORAL_TABLET | Freq: Three times a day (TID) | ORAL | 0 refills | Status: AC | PRN

## 2023-12-09 NOTE — ED Triage Notes (Signed)
 Woke up this morning at 3am with chest pain, congestion and productive cough, worse throughout the day. Went to urgent care and they told her it would be a few hours before they could see her and they would call her when she could be seen. Just kept feeling worse and couldn't wait so called EMS per EMS

## 2023-12-09 NOTE — Discharge Instructions (Signed)
 Take the medications to help with your cough and nausea.  Follow-up with your doctor if you are not feeling better in the next week.  Return to the ED for worsening symptoms

## 2023-12-09 NOTE — ED Provider Notes (Signed)
 [place] EMERGENCY DEPARTMENT AT [facility] Provider Note   CSN: [id number] Arrival date & time: 12/09/23  1356     History  Chief Complaint  Patient presents with   Cough   Chest Pain    Sydney Gross is a 89 y.o. female.   Cough Associated symptoms: chest pain   Chest Pain Associated symptoms: cough      Patient has a history of hyperlipidemia hypertension colitis hiatal hernia shingles peripheral artery disease COPD pulmonary hypertension, paroxysmal atrial fibrillation who presents to the ED with cough congestion.  Patient states she woke up about 3 AM and started having symptoms of cough congestion.  She also noted some discomfort in her chest with coughing.  She did notice some pink-colored sputum.  She tried to go to an urgent care but had to wait for a long period of time and she continued to feel worse so she came to the ED for evaluation  Home Medications Prior to Admission medications   Medication Sig Start Date End Date Taking? Authorizing Provider  benzonatate  (TESSALON ) 100 MG capsule Take 1 capsule (100 mg total) by mouth every 8 (eight) hours. 12/09/23  Yes Randol Simmonds, MD  ondansetron  (ZOFRAN -ODT) 8 MG disintegrating tablet Take 1 tablet (8 mg total) by mouth every 8 (eight) hours as needed for nausea or vomiting. 12/09/23  Yes Randol Simmonds, MD  albuterol  (PROVENTIL ) (2.5 MG/3ML) 0.083% nebulizer solution Take 3 mLs (2.5 mg total) by nebulization every 2 (two) hours as needed for wheezing or shortness of breath. 08/17/22   Vann, Jessica U, DO  albuterol  (VENTOLIN  HFA) 108 (90 Base) MCG/ACT inhaler Inhale 1 puff into the lungs every 6 (six) hours as needed for shortness of breath. 08/11/22   [provider]  apixaban  (ELIQUIS ) 2.5 MG TABS tablet Take 1 tablet by mouth twice daily 07/18/23   Johnson, Kathleen R, PA-C  Budeson-Glycopyrrol-Formoterol  (BREZTRI  AEROSPHERE) 160-9-4.8 MCG/ACT AERO Inhale 2 puffs into the lungs 2 (two) times daily as needed  (shortness of breath/ COPD). 04/14/23   Hunsucker, Donnice SAUNDERS, MD  denosumab  (PROLIA ) 60 MG/ML SOSY injection Inject 60 mg as directed every 6 (six) months.    [provider]  diphenhydramine -acetaminophen  (TYLENOL  PM) 25-500 MG TABS tablet Take 1 tablet by mouth at bedtime.    [provider]  Evolocumab  (REPATHA  SURECLICK) 140 MG/ML SOAJ INJECT 1 PEN INTO THE SKIN EVERY 14 DAYS 07/06/23   Swinyer, Rosaline HERO, NP  ezetimibe  (ZETIA ) 10 MG tablet Take 1 tablet (10 mg total) by mouth daily. 02/03/23   Swinyer, Michelle M, NP  ipratropium-albuterol  (DUONEB) 0.5-2.5 (3) MG/3ML SOLN Take 3 mLs by nebulization every 6 (six) hours as needed. 10/19/22   Hunsucker, Donnice SAUNDERS, MD  lisinopril -hydrochlorothiazide  (ZESTORETIC ) 20-25 MG tablet Take 0.5 tablets by mouth in the morning. Patient taking differently: Take 0.5 tablets by mouth daily. 06/18/22   Johnson, Kathleen R, PA-C  Magnesium  Cl-Calcium  Carbonate (SLOW-MAG PO) Take 1 tablet by mouth 2 (two) times daily.    [provider]  metoprolol  succinate (TOPROL -XL) 25 MG 24 hr tablet Take 1 tablet (25 mg total) by mouth daily. 02/03/23   Swinyer, Rosaline HERO, NP  metoprolol  tartrate (LOPRESSOR ) 25 MG tablet Take one half - one tablet (12.5 mg - 25 mg) as needed for Heart Rate 100 or above. 02/03/23   Swinyer, Rosaline HERO, NP  nitroGLYCERIN  (NITROSTAT ) 0.4 MG SL tablet Place 0.4 mg under the tongue every 5 (five) minutes as needed for chest pain.  [provider]  rosuvastatin  (CRESTOR ) 5 MG tablet Take 1 tablet (5 mg total) by mouth 3 (three) times a week. 03/18/23   Swinyer, Rosaline HERO, NP  tiZANidine  (ZANAFLEX ) 2 MG tablet Take 2 mg by mouth at bedtime. 11/17/22   [provider]      Allergies    Brilinta  [ticagrelor ], Codeine, Darvon [propoxyphene], Lipitor [atorvastatin], Rosuvastatin , Simvastatin, Other, and Tramadol  hcl    Review of Systems   Review of Systems  Respiratory:  Positive for cough.    Cardiovascular:  Positive for chest pain.    Physical Exam Updated Vital Signs BP (!) 166/70 (BP Location: Left Arm)   Pulse (!) 102   Temp 98.6 F (37 C) (Oral)   Resp 19   Ht 1.499 m (4' 11)   Wt 47.2 kg   SpO2 97%   BMI 21.01 kg/m  Physical Exam Vitals and nursing note reviewed.  Constitutional:      General: She is not in acute distress.    Appearance: She is well-developed.  HENT:     Head: Normocephalic and atraumatic.     Right Ear: External ear normal.     Left Ear: External ear normal.     Nose: Congestion present.  Eyes:     General: No scleral icterus.       Right eye: No discharge.        Left eye: No discharge.     Conjunctiva/sclera: Conjunctivae normal.  Neck:     Trachea: No tracheal deviation.  Cardiovascular:     Rate and Rhythm: Normal rate and regular rhythm.  Pulmonary:     Effort: Pulmonary effort is normal. No respiratory distress.     Breath sounds: Normal breath sounds. No stridor. No wheezing or rales.  Abdominal:     General: Bowel sounds are normal. There is no distension.     Palpations: Abdomen is soft.     Tenderness: There is no abdominal tenderness. There is no guarding or rebound.  Musculoskeletal:        General: No tenderness or deformity.     Cervical back: Neck supple.  Skin:    General: Skin is warm and dry.     Findings: No rash.  Neurological:     General: No focal deficit present.     Mental Status: She is alert.     Cranial Nerves: No cranial nerve deficit, dysarthria or facial asymmetry.     Sensory: No sensory deficit.     Motor: No abnormal muscle tone or seizure activity.     Coordination: Coordination normal.  Psychiatric:        Mood and Affect: Mood normal.     ED Results / Procedures / Treatments   Labs (all labs ordered are listed, but only abnormal results are displayed) Labs Reviewed  CBC - Abnormal; Notable for the following components:      Result Value   MCV 100.8 (*)    All other components  within normal limits  BASIC METABOLIC PANEL - Abnormal; Notable for the following components:   CO2 20 (*)    Glucose, Bld 104 (*)    BUN 31 (*)    All other components within normal limits  RESP PANEL BY RT-PCR (RSV, FLU A&B, COVID)  RVPGX2  TROPONIN I (HIGH SENSITIVITY)    EKG EKG Interpretation Date/Time:  Friday December 09 2023 14:14:00 EST Ventricular Rate:  100 PR Interval:  184 QRS Duration:  78 QT Interval:  335 QTC  Calculation: 432 R Axis:   45  Text Interpretation: Sinus tachycardia Since last tracing rate faster PAcs no longer present Confirmed by Randol Simmonds [phone]) on 12/09/2023 2:26:25 PM  Radiology DG Chest 2 View Result Date: 12/09/2023 CLINICAL DATA:  Chest pain.  Shortness of breath. EXAM: CHEST - 2 VIEW COMPARISON:  09/10/2022. FINDINGS: Bilateral lung fields are clear. Bilateral costophrenic angles are clear. Normal cardio-mediastinal silhouette. No acute osseous abnormalities. Note is made of a right humeral head chondrocalcinosis. The soft tissues are within normal limits. IMPRESSION: *No active cardiopulmonary disease. Electronically Signed   By: Ree Molt M.D.   On: 12/09/2023 16:21    Procedures Procedures    Medications Ordered in ED Medications - No data to display  ED Course/ Medical Decision Making/ A&P Clinical Course as of 12/09/23 1713  Fri Dec 09, 2023  1633 CBC is normal.  Metabolic panel is normal. [JK]  1633 Chest x-ray without acute findings [JK]    Clinical Course User Index [JK] Randol Simmonds, MD                                 Medical Decision Making Differential diagnosis includes but not limited to pneumonia, viral illness, pleural effusion, empyema ACS  Problems Addressed: Upper respiratory tract infection, unspecified type: acute illness or injury that poses a threat to life or bodily functions  Amount and/or Complexity of Data Reviewed Labs: ordered. Decision-making details documented in ED Course. Radiology: ordered and  independent interpretation performed.  Risk Prescription drug management.   Patient's ED workup is reassuring.  Her x-ray does not show pneumonia.  Her laboratory tests do not show any signs of leukocytosis or significant electrolyte abnormalities.  Patient's EKG and chest x-ray unremarkable.  I suspect symptoms are related to a viral illness.  Will send off COVID flu RSV testing.  Patient would like to go home.  Will follow up on her rsv covid testing        Final Clinical Impression(s) / ED Diagnoses Final diagnoses:  Upper respiratory tract infection, unspecified type    Rx / DC Orders ED Discharge Orders          Ordered    ondansetron  (ZOFRAN -ODT) 8 MG disintegrating tablet  Every 8 hours PRN        12/09/23 1643    benzonatate  (TESSALON ) 100 MG capsule  Every 8 hours        12/09/23 1643              Randol Simmonds, MD 12/09/23 1713

## 2023-12-09 NOTE — ED Notes (Signed)
Ambulatory to bathroom. Gait steady. No signs of distress.  °

## 2023-12-10 DIAGNOSIS — J069 Acute upper respiratory infection, unspecified: Secondary | ICD-10-CM | POA: Diagnosis not present

## 2023-12-12 ENCOUNTER — Other Ambulatory Visit (HOSPITAL_COMMUNITY): Payer: Self-pay

## 2023-12-12 ENCOUNTER — Other Ambulatory Visit: Payer: Self-pay

## 2023-12-12 MED ORDER — OSELTAMIVIR PHOSPHATE 30 MG PO CAPS
30.0000 mg | ORAL_CAPSULE | Freq: Two times a day (BID) | ORAL | 0 refills | Status: AC
  Filled 2023-12-12: qty 10, 5d supply, fill #0

## 2023-12-14 ENCOUNTER — Telehealth: Payer: Self-pay | Admitting: Nurse Practitioner

## 2023-12-14 DIAGNOSIS — I70219 Atherosclerosis of native arteries of extremities with intermittent claudication, unspecified extremity: Secondary | ICD-10-CM

## 2023-12-14 DIAGNOSIS — E785 Hyperlipidemia, unspecified: Secondary | ICD-10-CM

## 2023-12-14 DIAGNOSIS — I25118 Atherosclerotic heart disease of native coronary artery with other forms of angina pectoris: Secondary | ICD-10-CM

## 2023-12-14 MED ORDER — REPATHA SURECLICK 140 MG/ML ~~LOC~~ SOAJ: SUBCUTANEOUS | 1 refills | Status: AC

## 2023-12-14 NOTE — Telephone Encounter (Signed)
*  STAT* If patient is at the pharmacy, call can be transferred to refill team.   1. Which medications need to be refilled? (please list name of each medication and dose if known)   Evolocumab  (REPATHA  SURECLICK) 140 MG/ML SOAJ   2. Would you like to learn more about the convenience, safety, & potential cost savings by using the [facility]?   3. Are you open to using the [facility] (Type [facility]. ).  4. Which pharmacy/location (including street and city if local pharmacy) is medication to be sent to?  [facility] [address] [place], [place] - [address]   5. Do they need a 30 day or 90 day supply?  90 day  Daughter Levander) stated patient is completely out of this medication.

## 2024-01-03 ENCOUNTER — Other Ambulatory Visit: Payer: Self-pay | Admitting: Cardiology

## 2024-01-03 NOTE — Telephone Encounter (Signed)
Prescription refill request for Eliquis received. Indication:afib Last office visit:2/24 Scr:0.79  1/25 Age: 89 Weight:47.2  kg  Prescription refilled

## 2024-01-12 DIAGNOSIS — E782 Mixed hyperlipidemia: Secondary | ICD-10-CM | POA: Diagnosis not present

## 2024-01-12 DIAGNOSIS — Z0000 Encounter for general adult medical examination without abnormal findings: Secondary | ICD-10-CM | POA: Diagnosis not present

## 2024-01-12 DIAGNOSIS — J449 Chronic obstructive pulmonary disease, unspecified: Secondary | ICD-10-CM | POA: Diagnosis not present

## 2024-01-12 DIAGNOSIS — I10 Essential (primary) hypertension: Secondary | ICD-10-CM | POA: Diagnosis not present

## 2024-01-12 DIAGNOSIS — I739 Peripheral vascular disease, unspecified: Secondary | ICD-10-CM | POA: Diagnosis not present

## 2024-01-12 DIAGNOSIS — I2510 Atherosclerotic heart disease of native coronary artery without angina pectoris: Secondary | ICD-10-CM | POA: Diagnosis not present

## 2024-01-12 LAB — COMPREHENSIVE METABOLIC PANEL: EGFR: 67

## 2024-01-25 DIAGNOSIS — M818 Other osteoporosis without current pathological fracture: Secondary | ICD-10-CM | POA: Diagnosis not present

## 2024-05-07 ENCOUNTER — Other Ambulatory Visit: Payer: Self-pay | Admitting: Nurse Practitioner

## 2024-05-07 DIAGNOSIS — E785 Hyperlipidemia, unspecified: Secondary | ICD-10-CM

## 2024-05-07 DIAGNOSIS — I25118 Atherosclerotic heart disease of native coronary artery with other forms of angina pectoris: Secondary | ICD-10-CM

## 2024-05-12 ENCOUNTER — Other Ambulatory Visit: Payer: Self-pay | Admitting: Nurse Practitioner

## 2024-05-12 DIAGNOSIS — I25118 Atherosclerotic heart disease of native coronary artery with other forms of angina pectoris: Secondary | ICD-10-CM

## 2024-05-12 DIAGNOSIS — E785 Hyperlipidemia, unspecified: Secondary | ICD-10-CM

## 2024-05-31 DIAGNOSIS — K080 Exfoliation of teeth due to systemic causes: Secondary | ICD-10-CM | POA: Diagnosis not present

## 2024-07-17 ENCOUNTER — Other Ambulatory Visit: Payer: Self-pay | Admitting: Cardiology

## 2024-07-17 DIAGNOSIS — I10 Essential (primary) hypertension: Secondary | ICD-10-CM | POA: Diagnosis not present

## 2024-07-17 DIAGNOSIS — I2510 Atherosclerotic heart disease of native coronary artery without angina pectoris: Secondary | ICD-10-CM | POA: Diagnosis not present

## 2024-07-17 DIAGNOSIS — N183 Chronic kidney disease, stage 3 unspecified: Secondary | ICD-10-CM | POA: Diagnosis not present

## 2024-07-17 DIAGNOSIS — M810 Age-related osteoporosis without current pathological fracture: Secondary | ICD-10-CM | POA: Diagnosis not present

## 2024-07-17 DIAGNOSIS — E782 Mixed hyperlipidemia: Secondary | ICD-10-CM | POA: Diagnosis not present

## 2024-07-17 NOTE — Telephone Encounter (Signed)
 Prescription refill request for Eliquis  received. Indication:afib Last office visit:needs appt Scr:na Age: 89 Weight:47.2  kg  Prescription refilled

## 2024-07-30 ENCOUNTER — Other Ambulatory Visit: Payer: Self-pay

## 2024-07-30 ENCOUNTER — Encounter (HOSPITAL_BASED_OUTPATIENT_CLINIC_OR_DEPARTMENT_OTHER): Payer: Self-pay

## 2024-07-30 ENCOUNTER — Emergency Department (HOSPITAL_BASED_OUTPATIENT_CLINIC_OR_DEPARTMENT_OTHER)
Admission: EM | Admit: 2024-07-30 | Discharge: 2024-07-30 | Disposition: A | Discharge status: Home / Self Care | Source: Other Acute Inpatient Hospital

## 2024-07-30 ENCOUNTER — Ambulatory Visit: Payer: Self-pay | Admitting: Pulmonary Disease

## 2024-07-30 ENCOUNTER — Emergency Department (HOSPITAL_BASED_OUTPATIENT_CLINIC_OR_DEPARTMENT_OTHER)

## 2024-07-30 ENCOUNTER — Encounter (HOSPITAL_BASED_OUTPATIENT_CLINIC_OR_DEPARTMENT_OTHER): Payer: Self-pay | Admitting: Adult Health

## 2024-07-30 ENCOUNTER — Ambulatory Visit (HOSPITAL_BASED_OUTPATIENT_CLINIC_OR_DEPARTMENT_OTHER): Admitting: Adult Health

## 2024-07-30 VITALS — BP 109/74 | HR 137 | Ht 59.0 in | Wt 101.0 lb

## 2024-07-30 DIAGNOSIS — N189 Chronic kidney disease, unspecified: Secondary | ICD-10-CM | POA: Diagnosis not present

## 2024-07-30 DIAGNOSIS — I4892 Unspecified atrial flutter: Secondary | ICD-10-CM | POA: Diagnosis not present

## 2024-07-30 DIAGNOSIS — Z7901 Long term (current) use of anticoagulants: Secondary | ICD-10-CM | POA: Diagnosis not present

## 2024-07-30 DIAGNOSIS — I471 Supraventricular tachycardia, unspecified: Secondary | ICD-10-CM | POA: Diagnosis not present

## 2024-07-30 DIAGNOSIS — R0609 Other forms of dyspnea: Secondary | ICD-10-CM | POA: Diagnosis not present

## 2024-07-30 DIAGNOSIS — I480 Paroxysmal atrial fibrillation: Secondary | ICD-10-CM | POA: Diagnosis not present

## 2024-07-30 DIAGNOSIS — J4540 Moderate persistent asthma, uncomplicated: Secondary | ICD-10-CM | POA: Diagnosis not present

## 2024-07-30 DIAGNOSIS — I129 Hypertensive chronic kidney disease with stage 1 through stage 4 chronic kidney disease, or unspecified chronic kidney disease: Secondary | ICD-10-CM | POA: Insufficient documentation

## 2024-07-30 DIAGNOSIS — R0602 Shortness of breath: Secondary | ICD-10-CM | POA: Diagnosis not present

## 2024-07-30 DIAGNOSIS — J449 Chronic obstructive pulmonary disease, unspecified: Secondary | ICD-10-CM | POA: Diagnosis not present

## 2024-07-30 LAB — CBC
HCT: 37.2 % (ref 36.0–46.0)
Hemoglobin: 12 g/dL (ref 12.0–15.0)
MCH: 32.2 pg (ref 26.0–34.0)
MCHC: 32.3 g/dL (ref 30.0–36.0)
MCV: 99.7 fL (ref 80.0–100.0)
Platelets: 226 K/uL (ref 150–400)
RBC: 3.73 MIL/uL — ABNORMAL LOW (ref 3.87–5.11)
RDW: 13.3 % (ref 11.5–15.5)
WBC: 10 K/uL (ref 4.0–10.5)
nRBC: 0 % (ref 0.0–0.2)

## 2024-07-30 LAB — BASIC METABOLIC PANEL WITH GFR
Anion gap: 13 (ref 5–15)
BUN: 25 mg/dL — ABNORMAL HIGH (ref 8–23)
CO2: 22 mmol/L (ref 22–32)
Calcium: 10.8 mg/dL — ABNORMAL HIGH (ref 8.9–10.3)
Chloride: 108 mmol/L (ref 98–111)
Creatinine, Ser: 1.05 mg/dL — ABNORMAL HIGH (ref 0.44–1.00)
GFR, Estimated: 50 mL/min — ABNORMAL LOW (ref >60)
Glucose, Bld: 95 mg/dL (ref 70–99)
Potassium: 4.2 mmol/L (ref 3.5–5.1)
Sodium: 143 mmol/L (ref 135–145)

## 2024-07-30 LAB — RESP PANEL BY RT-PCR (RSV, FLU A&B, COVID)  RVPGX2
Influenza A by PCR: NEGATIVE
Influenza B by PCR: NEGATIVE
Resp Syncytial Virus by PCR: NEGATIVE
SARS Coronavirus 2 by RT PCR: NEGATIVE

## 2024-07-30 LAB — MAGNESIUM: Magnesium: 1.9 mg/dL (ref 1.7–2.4)

## 2024-07-30 MED ORDER — SODIUM CHLORIDE 0.9 % IV BOLUS
250.0000 mL | Freq: Once | INTRAVENOUS | Status: AC
  Administered 2024-07-30: 250 mL via INTRAVENOUS

## 2024-07-30 MED ORDER — ONDANSETRON HCL 4 MG/2ML IJ SOLN
4.0000 mg | Freq: Once | INTRAMUSCULAR | Status: AC
  Administered 2024-07-30: 4 mg via INTRAVENOUS
  Filled 2024-07-30: qty 2

## 2024-07-30 MED ORDER — CLOTRIMAZOLE 10 MG MT TROC: 10.0000 mg | Freq: Every day | OROMUCOSAL | 0 refills | Status: AC

## 2024-07-30 MED ORDER — METOPROLOL TARTRATE 5 MG/5ML IV SOLN
5.0000 mg | Freq: Once | INTRAVENOUS | Status: AC
  Administered 2024-07-30: 5 mg via INTRAVENOUS
  Filled 2024-07-30: qty 5

## 2024-07-30 MED ORDER — ETOMIDATE 2 MG/ML IV SOLN
0.1500 mg/kg | Freq: Once | INTRAVENOUS | Status: AC
  Administered 2024-07-30: 6.88 mg via INTRAVENOUS
  Filled 2024-07-30: qty 10

## 2024-07-30 NOTE — Hospital Course (Signed)
 SABRA

## 2024-07-30 NOTE — ED Provider Notes (Signed)
 [place] EMERGENCY DEPARTMENT AT [facility] Provider Note   CSN: [id number] Arrival date & time: 07/30/24  1429     Patient presents with: Tachycardia   RAEYA MERRITTS is a 89 y.o. female with past medical history significant for hypertension, aortic stenosis, CKD, COPD, peripheral arterial disease, history of A-fib with RVR, paroxysmal in nature, who presents concern for shortness of breath for a few weeks, worse with walking.  She denies any chest pain.  She denies any fever, chills, cough.  She does endorse a runny nose.  Worse over the last 2 days.  She reports that she has been with her Eliquis .   HPI     Prior to Admission medications   Medication Sig Start Date End Date Taking? Authorizing Provider  albuterol  (PROVENTIL ) (2.5 MG/3ML) 0.083% nebulizer solution Take 3 mLs (2.5 mg total) by nebulization every 2 (two) hours as needed for wheezing or shortness of breath. 08/17/22   Vann, Jessica U, DO  albuterol  (VENTOLIN  HFA) 108 (90 Base) MCG/ACT inhaler Inhale 1 puff into the lungs every 6 (six) hours as needed for shortness of breath. 08/11/22   [provider]  apixaban  (ELIQUIS ) 2.5 MG TABS tablet Take 1 tablet (2.5 mg total) by mouth 2 (two) times daily. NEEDS CARDIOLOGY APPT FOR REFILLS, CALL OFFICE [phone].  THANK YOU 07/17/24   Swinyer, Rosaline HERO, NP  benzonatate  (TESSALON ) 100 MG capsule Take 1 capsule (100 mg total) by mouth every 8 (eight) hours. 12/09/23   Randol Simmonds, MD  Budeson-Glycopyrrol-Formoterol  (BREZTRI  AEROSPHERE) 160-9-4.8 MCG/ACT AERO Inhale 2 puffs into the lungs 2 (two) times daily as needed (shortness of breath/ COPD). 04/14/23   Hunsucker, Donnice SAUNDERS, MD  clotrimazole  (MYCELEX ) 10 MG troche Take 1 tablet (10 mg total) by mouth 5 (five) times daily. 07/30/24   Parrett, Madelin RAMAN, NP  denosumab  (PROLIA ) 60 MG/ML SOSY injection Inject 60 mg as directed every 6 (six) months.    [provider]  diphenhydramine -acetaminophen  (TYLENOL  PM)  25-500 MG TABS tablet Take 1 tablet by mouth at bedtime.    [provider]  Evolocumab  (REPATHA  SURECLICK) 140 MG/ML SOAJ INJECT 1 PEN INTO THE SKIN EVERY 14 DAYS 12/14/23   Swinyer, Rosaline HERO, NP  ezetimibe  (ZETIA ) 10 MG tablet Take 1 tablet by mouth once daily 05/15/24   Swinyer, Michelle M, NP  ipratropium-albuterol  (DUONEB) 0.5-2.5 (3) MG/3ML SOLN Take 3 mLs by nebulization every 6 (six) hours as needed. 10/19/22   Hunsucker, Donnice SAUNDERS, MD  lisinopril -hydrochlorothiazide  (ZESTORETIC ) 20-25 MG tablet Take 0.5 tablets by mouth in the morning. Patient taking differently: Take 0.5 tablets by mouth daily. 06/18/22   Johnson, Kathleen R, PA-C  Magnesium  Cl-Calcium  Carbonate (SLOW-MAG PO) Take 1 tablet by mouth 2 (two) times daily.    [provider]  metoprolol  succinate (TOPROL -XL) 25 MG 24 hr tablet Take 1 tablet (25 mg total) by mouth daily. 02/03/23   Swinyer, Rosaline HERO, NP  metoprolol  tartrate (LOPRESSOR ) 25 MG tablet Take one half - one tablet (12.5 mg - 25 mg) as needed for Heart Rate 100 or above. 02/03/23   Swinyer, Rosaline HERO, NP  nitroGLYCERIN  (NITROSTAT ) 0.4 MG SL tablet Place 0.4 mg under the tongue every 5 (five) minutes as needed for chest pain.    [provider]  ondansetron  (ZOFRAN -ODT) 8 MG disintegrating tablet Take 1 tablet (8 mg total) by mouth every 8 (eight) hours as needed for nausea or vomiting. 12/09/23   Randol Simmonds, MD  oseltamivir  (TAMIFLU ) 30 MG  capsule Take 1 capsule (30 mg total) by mouth 2 (two) times daily. 12/12/23     rosuvastatin  (CRESTOR ) 5 MG tablet Take 1 tablet (5 mg total) by mouth 3 (three) times a week. 03/18/23   Swinyer, Rosaline HERO, NP  tiZANidine  (ZANAFLEX ) 2 MG tablet Take 2 mg by mouth at bedtime. 11/17/22   [provider]    Allergies: Brilinta  [ticagrelor ], Codeine, Darvon [propoxyphene], Lipitor [atorvastatin], Rosuvastatin , Simvastatin, Other, and Tramadol  hcl    Review of Systems  All other systems reviewed and are  negative.   Updated Vital Signs BP 135/79   Pulse 85   Temp 97.6 F (36.4 C) (Oral)   Resp 19   SpO2 100%   Physical Exam Vitals and nursing note reviewed.  Constitutional:      General: She is not in acute distress.    Appearance: Normal appearance.  HENT:     Head: Normocephalic and atraumatic.  Eyes:     General:        Right eye: No discharge.        Left eye: No discharge.  Cardiovascular:     Rate and Rhythm: Tachycardia present. Rhythm irregular.     Heart sounds: No murmur heard.    No friction rub. No gallop.  Pulmonary:     Effort: Pulmonary effort is normal.     Breath sounds: Normal breath sounds.  Abdominal:     General: Bowel sounds are normal.     Palpations: Abdomen is soft.  Skin:    General: Skin is warm and dry.     Capillary Refill: Capillary refill takes less than 2 seconds.  Neurological:     Mental Status: She is alert and oriented to person, place, and time.  Psychiatric:        Mood and Affect: Mood normal.        Behavior: Behavior normal.     (all labs ordered are listed, but only abnormal results are displayed) Labs Reviewed  CBC - Abnormal; Notable for the following components:      Result Value   RBC 3.73 (*)    All other components within normal limits  BASIC METABOLIC PANEL WITH GFR - Abnormal; Notable for the following components:   BUN 25 (*)    Creatinine, Ser 1.05 (*)    Calcium  10.8 (*)    GFR, Estimated 50 (*)    All other components within normal limits  RESP PANEL BY RT-PCR (RSV, FLU A&B, COVID)  RVPGX2  MAGNESIUM     EKG: None  Radiology: DG Chest Portable 1 View Result Date: 07/30/2024 CLINICAL DATA:  Shortness of breath. EXAM: PORTABLE CHEST 1 VIEW COMPARISON:  12/09/2023. FINDINGS: Bilateral lung fields are clear. Bilateral costophrenic angles are clear. Normal cardio-mediastinal silhouette. No acute osseous abnormalities. The soft tissues are within normal limits. IMPRESSION: No active disease. Electronically  Signed   By: Ree Molt M.D.   On: 07/30/2024 15:24     Procedures   Medications Ordered in the ED  metoprolol  tartrate (LOPRESSOR ) injection 5 mg (5 mg Intravenous Given 07/30/24 1521)  etomidate  (AMIDATE ) injection 6.88 mg (6.88 mg Intravenous Given 07/30/24 1650)  ondansetron  (ZOFRAN ) injection 4 mg (4 mg Intravenous Given 07/30/24 1642)  sodium chloride  0.9 % bolus 250 mL (0 mLs Intravenous Stopped 07/30/24 1701)  Medical Decision Making Amount and/or Complexity of Data Reviewed Labs: ordered. Radiology: ordered.  Risk Prescription drug management.   This patient is a 89 y.o. female  who presents to the ED for concern of palpitations, shob.   Differential diagnoses prior to evaluation: The emergent differential diagnosis includes, but is not limited to,  unstable tachydysrhythmia, unstable bradycardia, sick sinus syndrome, heart block or other abnormal electrophysiologic abnormality including WPW, LGL, brugada, SVT, afib, a flutter, vs other, asthma exacerbation, COPD exacerbation, acute upper respiratory infection, acute bronchitis, chronic bronchitis, interstitial lung disease, ARDS, PE, pneumonia, atypical ACS, carbon monoxide poisoning, spontaneous pneumothorax, new CHF vs CHF exacerbation, versus other . This is not an exhaustive differential.   Past Medical History / Co-morbidities / Social History: hypertension, aortic stenosis, CKD, COPD, peripheral arterial disease, history of A-fib with RVR, paroxysmal in nature  Additional history: Chart reviewed. Pertinent results include: Reviewed lab work, imaging from previous emergency department visits, outpatient cardiology, pulmonology visits  Physical Exam: Physical exam performed. The pertinent findings include: A-fib with RVR, normotensive, normal oxygen  saturation on room air.  Overall well-appearing, in no wheezing, rhonchi, stridor, rales  Lab Tests/Imaging studies: I personally  interpreted labs/imaging and the pertinent results include: CBC overall unremarkable.  BMP showsmildly elevated creatinine 1.05, BUN 25.  Mildly elevated calcium  at 10.8., magnesium  within normal limits.  RVP negative for COVID, flu, RSV .  I independently interpreted plain film chest x-ray shows no evidence of acute intrathoracic abnormality.I agree with the radiologist interpretation.  Cardiac monitoring: EKG obtained and interpreted by myself and attending physician which shows: A-fib, RVR.   Medications: I ordered medication including IV metoprolol .  I have reviewed the patients home medicines and have made adjustments as needed.   Consults: I spoke with Dr. Santo with cardiology who reports if possible would be beneficial to avoid hospitalization for this patient, okay with plan for possible cardioversion.  Cardioversion performed along with my attending Dr. Jerrol.  Successfully converted to normal sinus rhythm.  Monitored until she awoke from sedation.  Patient tolerated procedure without difficulty.  Disposition: After consideration of the diagnostic results and the patients response to treatment, I feel that patient stable for discharge with close cardiology, PCP follow-up.   emergency department workup does not suggest an emergent condition requiring admission or immediate intervention beyond what has been performed at this time. The plan is: as above. The patient is safe for discharge and has been instructed to return immediately for worsening symptoms, change in symptoms or any other concerns.    Final diagnoses:  Atrial fibrillation with RVR [facility])    ED Discharge Orders     None          Rosan Sherlean VEAR DEVONNA 07/30/24 1718    Jerrol Agent, MD 08/01/24 1154

## 2024-07-30 NOTE — Patient Instructions (Signed)
Transport to ER

## 2024-07-30 NOTE — Telephone Encounter (Signed)
 Pt has acute visit with TP 8/25.

## 2024-07-30 NOTE — ED Triage Notes (Signed)
 Pt c/o SHOB x few weeks, worsening w walking. Seen at pulmonolgy today for same, EKG said afib, so we were sent down here. EKG on file advised SVT at rate of 134  Pt advises chest aching, mostly at night when I go to bed.

## 2024-07-30 NOTE — ED Provider Notes (Signed)
 Physical Exam  BP (!) 139/90   Pulse (!) 126   Temp 97.6 F (36.4 C) (Oral)   Resp (!) 30   SpO2 100%     Procedures  .Cardioversion  Date/Time: 07/30/2024 4:55 PM  Performed by: Jerrol Agent, MD Authorized by: Jerrol Agent, MD   Consent:    Consent obtained:  Written   Consent given by:  Patient   Risks discussed:  Induced arrhythmia, pain, death and cutaneous burn   Alternatives discussed:  Rate-control medication, delayed treatment and observation Pre-procedure details:    Cardioversion basis:  Emergent   Rhythm:  Atrial fibrillation Patient sedated: Yes. Refer to sedation procedure documentation for details of sedation.  Attempt one:    Cardioversion mode:  Synchronous   Waveform:  Monophasic   Shock (Joules):  120   Shock outcome:  Conversion to normal sinus rhythm Post-procedure details:    Patient status:  Awake   Patient tolerance of procedure:  Tolerated well, no immediate complications .Sedation  Date/Time: 07/30/2024 4:56 PM  Performed by: Jerrol Agent, MD Authorized by: Jerrol Agent, MD   Consent:    Consent obtained:  Written   Consent given by:  Patient   Risks discussed:  Allergic reaction, dysrhythmia, inadequate sedation, nausea, vomiting, respiratory compromise necessitating ventilatory assistance and intubation, prolonged sedation necessitating reversal and prolonged hypoxia resulting in organ damage Universal protocol:    Immediately prior to procedure, a time out was called: yes     Patient identity confirmed:  Arm band and verbally with patient Indications:    Procedure performed:  Cardioversion   Procedure necessitating sedation performed by:  Physician performing sedation Pre-sedation assessment:    Time since last food or drink:  8 hrs   ASA classification: class 3 - patient with severe systemic disease     Mouth opening:  3 or more finger widths   Thyromental distance:  3 finger widths   Mallampati score:  I - soft palate,  uvula, fauces, pillars visible   Neck mobility: normal     Pre-sedation assessments completed and reviewed: airway patency, cardiovascular function, hydration status, mental status, nausea/vomiting, pain level, respiratory function and temperature   A pre-sedation assessment was completed prior to the start of the procedure Immediate pre-procedure details:    Verified: bag valve mask available, emergency equipment available, intubation equipment available, IV patency confirmed, oxygen  available, reversal medications available and suction available   Procedure details (see MAR for exact dosages):    Preoxygenation:  Nasal cannula   Sedation:  Etomidate    Intended level of sedation: deep   Analgesia:  None   Intra-procedure monitoring:  Blood pressure monitoring, cardiac monitor, continuous capnometry, continuous pulse oximetry, frequent LOC assessments and frequent vital sign checks   Intra-procedure events: none     Total Provider sedation time (minutes):  10 Post-procedure details:   A post-sedation assessment was completed following the completion of the procedure.   Attendance: Constant attendance by certified staff until patient recovered     Recovery: Patient returned to pre-procedure baseline     Post-sedation assessments completed and reviewed: airway patency, cardiovascular function, hydration status, mental status, nausea/vomiting, pain level, respiratory function and temperature     Patient is stable for discharge or admission: yes     Procedure completion:  Tolerated well, no immediate complications   ED Course / MDM    Medical Decision Making Amount and/or Complexity of Data Reviewed Labs: ordered. Radiology: ordered.  Risk Prescription drug management.  Jerrol Agent, MD 07/30/24 [phone]

## 2024-07-30 NOTE — Discharge Instructions (Addendum)
 We successfully were able to shock you out of your abnormal heart rhythm.  Please follow-up closely with your primary care doctor, cardiologist, continue to take your home medications.  You may experience some chest soreness after the cardioversion that we performed today.  Please return if you have significant worsening shortness of breath, feeling of heart racing.

## 2024-07-30 NOTE — Sedation Documentation (Signed)
 Unable to rate pain during procedure

## 2024-07-30 NOTE — Sedation Documentation (Signed)
 Magdalena Bare - daughter at bedside who will be transporting pt home.

## 2024-07-30 NOTE — Telephone Encounter (Signed)
 FYI Only or Action Required?: FYI only for provider.  Patient is followed in Pulmonology for COPD, last seen on 04/14/2023 by Hunsucker, Donnice SAUNDERS, MD.  Called Nurse Triage reporting Breathing Problem and Mouth Lesions.  Symptoms began a week ago.  Interventions attempted: Maintenance inhaler.  Symptoms are: unchanged.  Triage Disposition: See HCP Within 4 Hours (Or PCP Triage)  Patient/caregiver understands and will follow disposition?: Yes      Copied from CRM #[id number]. Topic: Clinical - Red Word Triage >> Jul 30, 2024 11:02 AM Joesph PARAS wrote: Red Word that prompted transfer to Nurse Triage: trouble breathing even on inhaler - breztri  usage increasing and has created sores in her mouth Reason for Disposition  [1] MILD difficulty breathing (e.g., minimal/no SOB at rest, SOB with walking, pulse < 100) AND [2] NEW-onset or WORSE than normal  Answer Assessment - Initial Assessment Questions E2C2 Pulmonary Triage - Initial Assessment Questions Chief Complaint (e.g., cough, sob, wheezing, fever, chills, sweat or additional symptoms) *Go to specific symptom protocol after initial questions. Mouth sores likely d/t increased breztri  usage SOB - wiped out  How long have symptoms been present? X 1 week  Have you tested for COVID or Flu? Note: If not, ask patient if a home test can be taken. If so, instruct patient to call back for positive results. No  MEDICINES:   Have you used any OTC meds to help with symptoms? No If yes, ask What medications? N/a  Have you used your inhalers/maintenance medication? No If yes, What medications? Budeson-Glycopyrrol-Formoterol  (BREZTRI ) - at least 2x daily, reports using more frequently  If inhaler, ask How many puffs and how often? Note: Review instructions on medication in the chart. See above  OXYGEN : Do you wear supplemental oxygen ? No If yes, How many liters are you supposed to use? N/a  Do you monitor your oxygen   levels? No If yes, What is your reading (oxygen  level) today? N/a  What is your usual oxygen  saturation reading?  (Note: Pulmonary O2 sats should be 90% or greater) N/a     1. RESPIRATORY STATUS: Describe your breathing? (e.g., wheezing, shortness of breath, unable to speak, severe coughing)      See above 2. ONSET: When did this breathing problem begin?      See above 3. PATTERN Does the difficult breathing come and go, or has it been constant since it started?      constant 4. SEVERITY: How bad is your breathing? (e.g., mild, moderate, severe)      Mild-moderate Daughter endorses SOB at rest 5. RECURRENT SYMPTOM: Have you had difficulty breathing before? If Yes, ask: When was the last time? and What happened that time?      denies 6. CARDIAC HISTORY: Do you have any history of heart disease? (e.g., heart attack, angina, bypass surgery, angioplasty)      CHF per chart 7. LUNG HISTORY: Do you have any history of lung disease?  (e.g., pulmonary embolus, asthma, emphysema)     COPD 8. CAUSE: What do you think is causing the breathing problem?      unknown 9. OTHER SYMPTOMS: Do you have any other symptoms? (e.g., chest pain, cough, dizziness, fever, runny nose)     Runny nose, denies others 10. O2 SATURATION MONITOR:  Do you use an oxygen  saturation monitor (pulse oximeter) at home? If Yes, ask: What is your reading (oxygen  level) today? What is your usual oxygen  saturation reading? (e.g., 95%)       denies 11. PREGNANCY:  Is there any chance you are pregnant? When was your last menstrual period?       N/a 12. TRAVEL: Have you traveled out of the country in the last month? (e.g., travel history, exposures)       denies  Protocols used: Breathing Difficulty-A-AH

## 2024-07-30 NOTE — Progress Notes (Signed)
 @Patient  ID: Sydney Gross, female    DOB: May 03, 1930, 89 y.o.   MRN: [id number]  Chief Complaint  Patient presents with   Acute Visit    SOB     Referring provider: Loreli Kins, MD  HPI: 89 year old female never smoker followed for asthma, bronchiectasis Medical history significant for A-fib, mild aortic valve stenosis, mild pulmonary hypertension, pulmonary artery disease, coronary artery disease  TEST/EVENTS :   PFTs November 11, 2022 showed moderate obstruction, reversibility (positive bronchodilator response) FEV1 80%, ratio 69, FVC 83%, 11% bronchodilator change, DLCO 75%  07/30/2024 Acute OV : Asthma /Dyspnea   Discussed the use of AI scribe software for clinical note transcription with the patient, who gave verbal consent to proceed.  History of Present Illness Sydney Gross is a 89 year old female with asthma and atrial fibrillation who presents with worsening shortness of breath.  She has experienced worsening shortness of breath over the past year, with a significant increase in severity recently over last week.  She finds it difficult to walk short distances, such as from her bedroom to her den, without gasping for breath. She uses a Breztri  inhaler twice daily for asthma management and has albuterol  available for rescue; she does not currently have the medicine for her nebulizer. No coughing or wheezing. Has chest tightness and aching in her chest for the last week especially at night.   Her medical history includes atrial fibrillation, for which she takes Eliquis  twice daily and metoprolol  for heart rate control. Endorses compliance.  She has not visited her cardiologist in over two years due to her previous doctor's retirement. She has history of PAD and Mild aortic valve stents. Last chest xray 12/2023 showed clear lungs.   No coughing, wheezing, leg swelling, or calf pain. However, she experiences chest aching at night, which has been persistent. Her  appetite has decreased, and she attributes this to oral thrush, which has affected her eating and drinking habits. Current weight is 101lbs.   Socially, she lives at home with her 37 year old husband, and she both still drive, although she is the primary driver.     Allergies  Allergen Reactions   Brilinta  [Ticagrelor ] Shortness Of Breath   Codeine Nausea And Vomiting   Darvon [Propoxyphene] Nausea Only   Lipitor [Atorvastatin] Other (See Comments)    MYALGIAS   Rosuvastatin  Other (See Comments)    MYALGIAS    Simvastatin Other (See Comments)    MYALGIAS    Other Other (See Comments)   Tramadol  Hcl Nausea Only    nausea (08/2019)    Immunization History  Administered Date(s) Administered   Fluad Quad(high Dose 65+) 09/15/2022   Influenza,inj,Quad PF,6+ Mos 12/11/2013   PFIZER(Purple Top)SARS-COV-2 Vaccination 12/26/2019, 01/13/2020   Tdap 11/08/2022    Past Medical History:  Diagnosis Date   Allergic rhinitis    Arthritis    maybe a little in my knees and back (07/17/2018)   Chronic lower back pain    Colitis 12/2013   never had this before   COPD (chronic obstructive pulmonary disease) (HCC)    questionable/Dr. Claudene (07/17/2018)   Coronary artery disease    GI bleed    H/O calcium  pyrophosphate deposition disease (CPPD)    Hiatal hernia    Hyperlipidemia    Hypertension    Mild mitral regurgitation    On home oxygen  therapy    I've used it 2 times in 2 yrs (07/17/2018)   PAD (peripheral artery disease) (HCC)  PAF (paroxysmal atrial fibrillation) (HCC)    Pulmonary hypertension (HCC)    Shingles     Tobacco History: Social History   Tobacco Use  Smoking Status Never   Passive exposure: Past  Smokeless Tobacco Never   Counseling given: Not Answered   Outpatient Medications Prior to Visit  Medication Sig Dispense Refill   albuterol  (PROVENTIL ) (2.5 MG/3ML) 0.083% nebulizer solution Take 3 mLs (2.5 mg total) by nebulization every 2 (two)  hours as needed for wheezing or shortness of breath. 75 mL 12   albuterol  (VENTOLIN  HFA) 108 (90 Base) MCG/ACT inhaler Inhale 1 puff into the lungs every 6 (six) hours as needed for shortness of breath.     apixaban  (ELIQUIS ) 2.5 MG TABS tablet Take 1 tablet (2.5 mg total) by mouth 2 (two) times daily. NEEDS CARDIOLOGY APPT FOR REFILLS, CALL OFFICE [phone].  THANK YOU 60 tablet 0   benzonatate  (TESSALON ) 100 MG capsule Take 1 capsule (100 mg total) by mouth every 8 (eight) hours. 21 capsule 0   Budeson-Glycopyrrol-Formoterol  (BREZTRI  AEROSPHERE) 160-9-4.8 MCG/ACT AERO Inhale 2 puffs into the lungs 2 (two) times daily as needed (shortness of breath/ COPD). 1 each 11   denosumab  (PROLIA ) 60 MG/ML SOSY injection Inject 60 mg as directed every 6 (six) months.     diphenhydramine -acetaminophen  (TYLENOL  PM) 25-500 MG TABS tablet Take 1 tablet by mouth at bedtime.     Evolocumab  (REPATHA  SURECLICK) 140 MG/ML SOAJ INJECT 1 PEN INTO THE SKIN EVERY 14 DAYS 6 mL 1   ezetimibe  (ZETIA ) 10 MG tablet Take 1 tablet by mouth once daily 90 tablet 2   ipratropium-albuterol  (DUONEB) 0.5-2.5 (3) MG/3ML SOLN Take 3 mLs by nebulization every 6 (six) hours as needed. 360 mL 2   lisinopril -hydrochlorothiazide  (ZESTORETIC ) 20-25 MG tablet Take 0.5 tablets by mouth in the morning. (Patient taking differently: Take 0.5 tablets by mouth daily.)     Magnesium  Cl-Calcium  Carbonate (SLOW-MAG PO) Take 1 tablet by mouth 2 (two) times daily.     metoprolol  succinate (TOPROL -XL) 25 MG 24 hr tablet Take 1 tablet (25 mg total) by mouth daily. 90 tablet 3   metoprolol  tartrate (LOPRESSOR ) 25 MG tablet Take one half - one tablet (12.5 mg - 25 mg) as needed for Heart Rate 100 or above. 30 tablet 6   nitroGLYCERIN  (NITROSTAT ) 0.4 MG SL tablet Place 0.4 mg under the tongue every 5 (five) minutes as needed for chest pain.     ondansetron  (ZOFRAN -ODT) 8 MG disintegrating tablet Take 1 tablet (8 mg total) by mouth every 8 (eight) hours as  needed for nausea or vomiting. 12 tablet 0   oseltamivir  (TAMIFLU ) 30 MG capsule Take 1 capsule (30 mg total) by mouth 2 (two) times daily. 10 capsule 0   rosuvastatin  (CRESTOR ) 5 MG tablet Take 1 tablet (5 mg total) by mouth 3 (three) times a week. 45 tablet 3   tiZANidine  (ZANAFLEX ) 2 MG tablet Take 2 mg by mouth at bedtime.     No facility-administered medications prior to visit.     Review of Systems:   Constitutional:   No  weight loss, night sweats,  Fevers, chills, +fatigue, or  lassitude.  HEENT:   No headaches,  Difficulty swallowing,  Tooth/dental problems, or  Sore throat,                No sneezing, itching, ear ache, nasal congestion, post nasal drip,   CV:  No Orthopnea, PND, swelling in lower extremities, anasarca, dizziness, palpitations, syncope. +chest  tightness   GI  No heartburn, indigestion, abdominal pain, nausea, vomiting, diarrhea, change in bowel habits, loss of appetite, bloody stools.   Resp: No excess mucus, no productive cough,  No non-productive cough,  No coughing up of blood.  No change in color of mucus.  No wheezing.  No chest wall deformity  Skin: no rash or lesions.  GU: no dysuria, change in color of urine, no urgency or frequency.  No flank pain, no hematuria   MS:  No joint pain or swelling.  No decreased range of motion.  No back pain.    Physical Exam  BP 109/74 (BP Location: Left Arm, Patient Position: Sitting, Cuff Size: Normal)   Pulse (!) 137   Ht 4' 11 (1.499 m)   Wt 101 lb (45.8 kg)   SpO2 94%   BMI 20.40 kg/m   GEN: A/Ox3; pleasant , NAD, thin and elderly    HEENT:  [place]/AT,  EACs-clear, TMs-wnl, NOSE-clear, THROAT-clear, no lesions, no postnasal drip or exudate noted. Scattered thrush along inner lips .  NECK:  Supple w/ fair ROM; no JVD; normal carotid impulses w/o bruits; no thyromegaly or nodules palpated; no lymphadenopathy.    RESP  Clear  P & A; w/o, wheezes/ rales/ or rhonchi. no accessory muscle use, no dullness to  percussion  CARD:  Tachycardia , Gr 1 SM   no peripheral edema, pulses intact, no cyanosis or clubbing.  GI:   Soft & nt; nml bowel sounds; no organomegaly or masses detected.   Musco: Warm bil, no deformities or joint swelling noted.   Neuro: alert, no focal deficits noted.    Skin: Warm, no lesions or rashes  EKG: SVT HR: 143   Lab Results:  CBC    Component Value Date/Time   WBC 9.4 12/09/2023 1521   RBC 3.87 12/09/2023 1521   HGB 12.3 12/09/2023 1521   HGB 10.2 (L) 08/14/2021 0000   HCT 39.0 12/09/2023 1521   HCT 31.9 (L) 08/14/2021 0000   PLT 243 12/09/2023 1521   PLT 280 08/14/2021 0000   MCV 100.8 (H) 12/09/2023 1521   MCV 87 08/14/2021 0000   MCH 31.8 12/09/2023 1521   MCHC 31.5 12/09/2023 1521   RDW 12.7 12/09/2023 1521   RDW 15.8 (H) 08/14/2021 0000   LYMPHSABS 4.2 (H) 09/10/2022 1803   MONOABS 1.3 (H) 09/10/2022 1803   EOSABS 0.0 09/10/2022 1803   BASOSABS 0.0 09/10/2022 1803    BMET    Component Value Date/Time   NA 138 12/09/2023 1521   NA 142 08/14/2021 1634   K 4.3 12/09/2023 1521   CL 106 12/09/2023 1521   CO2 20 (L) 12/09/2023 1521   GLUCOSE 104 (H) 12/09/2023 1521   BUN 31 (H) 12/09/2023 1521   BUN 19 08/14/2021 1634   CREATININE 0.79 12/09/2023 1521   CALCIUM  10.3 12/09/2023 1521   GFRNONAA >60 12/09/2023 1521   GFRAA 36 (L) 01/29/2019 2115    BNP    Component Value Date/Time   BNP 70.4 08/16/2022 1637    ProBNP    Component Value Date/Time   PROBNP 645 08/14/2021 1634    Imaging: No results found.  Administration History     None          Latest Ref Rng & Units 11/11/2022    2:56 PM  PFT Results  FVC-Pre L 1.23   FVC-Predicted Pre % 78   FVC-Post L 1.30   FVC-Predicted Post % 83   Pre FEV1/FVC % %  65   Post FEV1/FCV % % 69   FEV1-Pre L 0.80   FEV1-Predicted Pre % 71   FEV1-Post L 0.90   DLCO uncorrected ml/min/mmHg 11.56   DLCO UNC% % 75   DLCO corrected ml/min/mmHg 11.56   DLCO COR %Predicted % 75    DLVA Predicted % 87   TLC L 3.99   TLC % Predicted % 92   RV % Predicted % 105     No results found for: NITRICOXIDE      Assessment & Plan:   No problem-specific Assessment & Plan notes found for this encounter.  Assessment and Plan Assessment & Plan SVT with HR 134: with associated progressive severe dyspnea, chest tightness.  She experiences progressive shortness of breath, chest tightness, and a racing heart, especially at night. There are obvious signs of fluid overload or congestive heart failure, but there is a risk of progression if untreated. Transport to the emergency room for further evaluation and management.   Chest tightness/pain over last week. EKG with SVT - needs further evaluation . Work up in ER to rule out acute cardiac ischemia.   Asthma -This appears more cardiac in nature.  Asthma has worsened over the past year, with recent exacerbation and increased shortness of breath. She uses a Breztri  inhaler twice daily and albuterol  as needed but lacks an albuterol  supply. Refill albuterol  inhaler and nebulizer medication. Advise brushing, rinsing, and gargling after using the Breztri  inhaler to prevent oral candidiasis.  Oral candidiasis (thrush)   Oral candidiasis is present with a white coating in the mouth, contributing to decreased appetite. Prescribe Mycelex   lozenges to be taken five times a day for one week. Advise brushing, rinsing, and gargling after using inhalers. Recommend rinsing with diluted peroxide twice a week and suggest eating yogurt daily.  Hypertension   Hypertension is managed with metoprolol  and lisinopril . . Ensure continuation of current antihypertensive medications.   I spent  42  minutes dedicated to the care of this patient on the date of this encounter to include pre-visit review of records, face-to-face time with the patient discussing conditions above, post visit ordering of testing, clinical documentation with the electronic health  record, making appropriate referrals as documented, and communicating necessary findings to members of the patients care team.    Madelin Stank, NP 07/30/2024

## 2024-07-30 NOTE — ED Notes (Signed)
 RT present for bedside cardioversion. Pt placed on ETCO2 monitoring and 2L oxygen  for procedure. Suction and ambu bag present and set up at pt bedside as well.

## 2024-08-07 ENCOUNTER — Encounter (HOSPITAL_BASED_OUTPATIENT_CLINIC_OR_DEPARTMENT_OTHER): Payer: Self-pay

## 2024-08-09 ENCOUNTER — Ambulatory Visit: Attending: Cardiology | Admitting: Cardiology

## 2024-08-09 ENCOUNTER — Encounter: Payer: Self-pay | Admitting: Cardiology

## 2024-08-09 VITALS — BP 161/62 | HR 75 | Resp 16 | Ht 59.0 in | Wt 103.6 lb

## 2024-08-09 DIAGNOSIS — Z789 Other specified health status: Secondary | ICD-10-CM

## 2024-08-09 DIAGNOSIS — I2510 Atherosclerotic heart disease of native coronary artery without angina pectoris: Secondary | ICD-10-CM | POA: Diagnosis not present

## 2024-08-09 DIAGNOSIS — I4892 Unspecified atrial flutter: Secondary | ICD-10-CM

## 2024-08-09 DIAGNOSIS — Z7901 Long term (current) use of anticoagulants: Secondary | ICD-10-CM

## 2024-08-09 DIAGNOSIS — Z9289 Personal history of other medical treatment: Secondary | ICD-10-CM

## 2024-08-09 DIAGNOSIS — E7800 Pure hypercholesterolemia, unspecified: Secondary | ICD-10-CM

## 2024-08-09 DIAGNOSIS — I739 Peripheral vascular disease, unspecified: Secondary | ICD-10-CM

## 2024-08-09 DIAGNOSIS — I10 Essential (primary) hypertension: Secondary | ICD-10-CM

## 2024-08-09 MED ORDER — APIXABAN 2.5 MG PO TABS: 2.5000 mg | ORAL_TABLET | Freq: Two times a day (BID) | ORAL | 3 refills | Status: AC

## 2024-08-09 MED ORDER — METOPROLOL SUCCINATE ER 50 MG PO TB24: 50.0000 mg | ORAL_TABLET | Freq: Every day | ORAL | 3 refills | Status: AC

## 2024-08-09 NOTE — Progress Notes (Signed)
 Cardiology Office Note:  .   Date:  08/09/2024  ID:  Sydney Gross, DOB 06/02/1930, MRN [id number] PCP:  Loreli Kins, MD  Former Cardiology Providers: Dr. Claudene [facility] Providers Cardiologist:  Madonna Large, DO , [facility] (established care 08/09/24) Electrophysiologist:  None  Click to update primary MD,subspecialty MD or APP then REFRESH:1}    Chief Complaint  Patient presents with   Follow-up    Reestablish care, atrial flutter, status post cardioversion    History of Present Illness: .   Sydney Gross is a 89 y.o. Caucasian female whose past medical history and cardiovascular risk factors includes: CAD with prior stents, HLD with history of statin intolerance, HTN, COPD, PAD with claudication and prior stents, chronic DOE, persistent atrial flutter, status post cardioversion 07/2024.   Formally under the care of Dr. Claudene who last saw Sydney Gross back in 06/2022. I am seeing her for the first time to re-establishing care.   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 Has known flush SFA occlusion bilaterally as well. Most recent cardiac catheterization revealed high-grade obstruction in mid LAD and proximal to mid first diagonal with successful PCI/stent to both the mid LAD and proximal to mid first diagonal.   Admission 7/12-7/14/23 with PAF which spontaneously converted. Because of her age and high CHA2DS2-VASc she was started on Eliquis . Cardiac monitor was ordered at hospital discharge which revealed basic underlying rhythm NSR with average HR 78 bpm, atrial flutter burden 7%.  Patient is accompanied by her daughter Joen at today's office visit.  Patient was recently seen in the ER on 07/30/2024 for symptomatic atrial flutter with rapid ventricular rate.  Since she was already anticoagulated  ED physician performed cardioversion and normal sinus rhythm was restored.  She takes Toprol -XL 25 mg p.o. daily and was given Lopressor  to use on as needed basis.  Patient states that she has not required as needed Lopressor  since hospital discharge.  She does not endorse evidence of bleeding.  Shortness of breath predominant with effort related activity still remains her principal symptom.  Her blood pressure at today's office visit is elevated.  She does not check her blood pressures at home.  She has known history of peripheral vascular disease with prior peripheral interventions but is lost to follow-up with vascular surgery.  She has history of statin intolerance and is currently on Repatha  and Zetia .  Patient states that she is taking both of the medications regularly.  However most recent lipid profile from August 2025 illustrates an LDL of 142 mg/dL.  Upon further questioning patient states that she may be administering her Repatha  incorrectly.  Review of Systems: .   Review of Systems  Cardiovascular:  Positive for dyspnea on exertion. Negative for chest pain, claudication, irregular heartbeat, leg swelling, near-syncope, orthopnea, palpitations, paroxysmal nocturnal dyspnea and syncope.  Hematologic/Lymphatic: Negative for bleeding problem.    Studies Reviewed:   EKG: EKG Interpretation Date/Time:  Thursday August 09 2024 14:56:36 EDT Ventricular Rate:  74 PR Interval:  164 QRS Duration:  68 QT Interval:  388 QTC Calculation: 430 R Axis:   27  Text Interpretation: Normal sinus rhythm Normal ECG When compared with ECG of 30-Jul-2024 16:52, No significant change since last tracing Confirmed by Large Madonna [phone]) on 08/09/2024 3:15:19 PM  Echocardiogram: 09/2022  1. Left ventricular ejection fraction, by  estimation, is 65 to 70%. The  left ventricle has normal function. The left ventricle has no regional  wall motion abnormalities. There is mild left ventricular hypertrophy  of  the basal-septal segment. Left  ventricular diastolic function could not be evaluated.   2. Right ventricular systolic function is normal. The right ventricular  size is normal. There is mildly elevated pulmonary artery systolic  pressure. The estimated right ventricular systolic pressure is 38.0 mmHg.   3. Left atrial size was moderately dilated.   4. The mitral valve is normal in structure. Mild mitral valve  regurgitation. No evidence of mitral stenosis.   5. Tricuspid valve regurgitation is mild to moderate.   6. The aortic valve is tricuspid. There is moderate calcification of the  aortic valve. There is moderate thickening of the aortic valve. Aortic  valve regurgitation is mild. Mild aortic valve stenosis. Aortic  regurgitation PHT measures 584 msec. Aortic  valve area, by VTI measures 1.39 cm. Aortic valve mean gradient measures  16.0 mmHg. Aortic valve Vmax measures 2.87 m/s.   7. The inferior vena cava is normal in size with greater than 50%  respiratory variability, suggesting right atrial pressure of 3 mmHg.   8. A small pericardial effusion is present. The pericardial effusion is  anterior to the right ventricle. There is no evidence of cardiac  tamponade.   Heart catheterization: 2019 Coronary artery disease with high-grade obstruction in the mid LAD and proximal to mid first diagonal. Normal left main. Normal circumflex coronary artery. Dominant RCA with luminal irregularities up to 40% distally. Right heart pressures Ventricular systolic function and LVEDP (14 mmHg). Successful PCI and stent implantation in both the mid LAD and proximal to mid first diagonal.   The LAD 90% stenosis was reduced to 0% with TIMI grade III flow using a 2.5 x 28 Synergy postdilated to 2.75 mm in diameter.  TIMI grade III flow pre-and post stenting. The first diagonal 90% stenosis was reduced to 0% using by 2.25 mm Synergy deployed at 14 atm which is not effective stent size of 2.32 mm.   TIMI grade III flow noted pre-and post PCI.  RADIOLOGY: N/A  Risk Assessment/Calculations:   Click Here to Calculate/Change CHADS2VASc Score The patient's CHADS2-VASc score is 5, indicating a 7.2% annual risk of stroke.   CHF History: No HTN History: Yes Diabetes History: No Stroke History: No Vascular Disease History: Yes   Labs:       Latest Ref Rng & Units 07/30/2024    3:14 PM 12/09/2023    3:21 PM 09/15/2022    7:12 AM  CBC  WBC 4.0 - 10.5 K/uL 10.0  9.4  14.4   Hemoglobin 12.0 - 15.0 g/dL 87.9  87.6  9.9   Hematocrit 36.0 - 46.0 % 37.2  39.0  30.5   Platelets 150 - 400 K/uL 226  243  216        Latest Ref Rng & Units 07/30/2024    3:14 PM 12/09/2023    3:21 PM 09/12/2022    4:05 AM  BMP  Glucose 70 - 99 mg/dL 95  895  866   BUN 8 - 23 mg/dL 25  31  18    Creatinine 0.44 - 1.00 mg/dL 8.94  9.20  9.12   Sodium 135 - 145 mmol/L 143  138  134   Potassium 3.5 - 5.1 mmol/L 4.2  4.3  3.9   Chloride 98 - 111 mmol/L 108  106  105   CO2  22 - 32 mmol/L 22  20  21    Calcium  8.9 - 10.3 mg/dL 89.1  89.6  7.1       Latest Ref Rng & Units 07/30/2024    3:14 PM 12/09/2023    3:21 PM 09/12/2022    4:05 AM  CMP  Glucose 70 - 99 mg/dL 95  895  866   BUN 8 - 23 mg/dL 25  31  18    Creatinine 0.44 - 1.00 mg/dL 8.94  9.20  9.12   Sodium 135 - 145 mmol/L 143  138  134   Potassium 3.5 - 5.1 mmol/L 4.2  4.3  3.9   Chloride 98 - 111 mmol/L 108  106  105   CO2 22 - 32 mmol/L 22  20  21    Calcium  8.9 - 10.3 mg/dL 89.1  89.6  7.1     Lab Results  Component Value Date   CHOL 214 (H) 09/29/2021   HDL 38 (L) 09/29/2021   LDLCALC 140 (H) 09/29/2021   TRIG 170 (H) 10/05/2021   CHOLHDL 5.6 (H) 09/29/2021   No results for input(s): LIPOA in the last 8760 hours. No components found for: NTPROBNP No results for input(s): PROBNP in the last 8760 hours. No results for input(s): TSH in the last 8760 hours.  External Labs: Collected: 07/17/2024 [facility] database Total cholesterol 218, HDL  43, LDL 142, triglycerides 184 Hemoglobin 12. Creatinine 1.05. Potassium 4.2.   Physical Exam:    Today's Vitals   08/09/24 1453  BP: (!) 161/62  Pulse: 75  Resp: 16  SpO2: 99%  Weight: 103 lb 9.6 oz (47 kg)  Height: 4' 11 (1.499 m)   Body mass index is 20.92 kg/m. Wt Readings from Last 3 Encounters:  08/09/24 103 lb 9.6 oz (47 kg)  07/30/24 101 lb (45.8 kg)  12/09/23 104 lb (47.2 kg)    Physical Exam  Constitutional: No distress.  hemodynamically stable  Neck: No JVD present.  Cardiovascular: Normal rate, regular rhythm, S1 normal and S2 normal. Exam reveals no gallop, no S3 and no S4.  No murmur heard. Pulmonary/Chest: Effort normal and breath sounds normal. No stridor. She has no wheezes. She has no rales.  Musculoskeletal:        General: No edema.     Cervical back: Neck supple.  Skin: Skin is warm.     Impression & Recommendation(s):  Impression:   ICD-10-CM   1. Coronary artery disease involving native coronary artery of native heart without angina pectoris  I25.10 EKG 12-Lead    ECHOCARDIOGRAM COMPLETE    2. Persistent atrial flutter (HCC)  I48.92 ECHOCARDIOGRAM COMPLETE    3. History of cardioversion  Z92.89     4. Long term (current) use of anticoagulants  Z79.01 ECHOCARDIOGRAM COMPLETE    5. Pure hypercholesterolemia  E78.00     6. Statin intolerance  Z78.9     7. Essential hypertension  I10     8. PAD (peripheral artery disease) (HCC)  I73.9        Recommendation(s):  Coronary artery disease involving native coronary artery of native heart without angina pectoris Denies anginal chest pain. Continue lipid-lowering agents EKG nonischemic. LDL currently not at goal, recommended goal LDL at least less than 70 mg/dL and if possible close to 55 mg/dL given her history of CAD, prior coronary interventions, PAD/ PVI Echo will be ordered to evaluate for structural heart disease and left ventricular systolic function.  Persistent atrial flutter  (HCC) History of  cardioversion Long term (current) use of anticoagulants Rate control: Toprol -XL. Rhythm control: N/A. Thromboembolic prophylaxis: Eliquis  Does not endorse evidence of bleeding. Risks, benefits, alternatives to anticoagulation discussed. Eliquis  refilled. Labs 07/2024 reviewed. Status post cardioversion 07/30/2024 with ED provider. Will increase Toprol -XL from 25 mg p.o. daily to 50 mg p.o. daily. Discontinue Lopressor  on as needed basis  Pure hypercholesterolemia Statin intolerance Currently on Zetia  and Repatha . Most recent lipid profile from 07/2024 notes an LDL of 142 mg/dL. After further questioning patient states that she may be not getting all the Repatha  injected as some the medication spills out. Her daughter who was present at today's office visit also is on Repatha  and informs me that she will help her to make sure she is administering it correctly.   Recommend that she has her lipids rechecked with PCP in 6 weeks We will remove Crestor  from the medication list as she is intolerant to statin therapy  Essential hypertension Office blood pressures are not controlled, likely contributing to her shortness of breath. Does not check her blood pressures at home. Increase Toprol -XL as discussed above. Currently on lisinopril /hydrochlorothiazide  20/25 half a tablet in the morning. Reemphasized importance of low-salt diet. Given her advanced age we will hold off on multiple medication changes today. However, if SBP is consistently greater than 130 mmHg she is advised to call the PCP for further guidance.  I suspect the dose of lisinopril /hydrochlorothiazide  will need to be increased.  PAD (peripheral artery disease) (HCC) Denies claudication. Has had peripheral vascular interventions in the past. Given her advanced age she is quite independent and would benefit from follow-up with vascular surgery.  Encouraged them to reestablish care.  Total time spent: 43  minutes Seeing the patient for the first time as patient is reestablishing care from Dr. Theressa practice Reviewed the last progress note from [facility], 08/05/2022. Reviewed ER documentation from 07/30/2024 Independently reviewed labs from 07/30/2024 as well as 07/17/2024 EKG ordered and independently reviewed 9//2025 Prescription drug management Management of at least 2 chronic comorbid conditions. Ordered additional diagnostic workup. Plan of care discussed with the patient and daughter at today's visit.  Daughter also provided collateral history.  Orders Placed:  Orders Placed This Encounter  Procedures   EKG 12-Lead   ECHOCARDIOGRAM COMPLETE    Standing Status:   Future    Expected Date:   02/06/2025    Expiration Date:   08/09/2025    Where should this test be performed[facility]    Does the patient weigh less than or greater than 250 lbs?:   Patient weighs less than 250 lbs    Perflutren DEFINITY (image enhancing agent) should be administered unless hypersensitivity or allergy exist:   Administer Perflutren    Reason for exam-Echo:   Other-Full Diagnosis List    Full ICD-10/Reason for Exam:   Cardiac murmur [197301]    Full ICD-10/Reason for Exam:   Coronary artery disease [833149]    Full ICD-10/Reason for Exam:   Mixed hyperlipidemia [272.2.ICD-9-CM]     Final Medication List:    Meds ordered this encounter  Medications   apixaban  (ELIQUIS ) 2.5 MG TABS tablet    Sig: Take 1 tablet (2.5 mg total) by mouth 2 (two) times daily.    Dispense:  180 tablet    Refill:  3   metoprolol  succinate (TOPROL -XL) 50 MG 24 hr tablet    Sig: Take 1 tablet (50 mg total) by mouth daily.    Dispense:  90  tablet    Refill:  3    Medications Discontinued During This Encounter  Medication Reason   metoprolol  tartrate (LOPRESSOR ) 25 MG tablet Discontinued by provider   metoprolol  succinate (TOPROL -XL) 25 MG 24 hr tablet Reorder   apixaban  (ELIQUIS ) 2.5 MG TABS tablet  Reorder     Current Outpatient Medications:    albuterol  (PROVENTIL ) (2.5 MG/3ML) 0.083% nebulizer solution, Take 3 mLs (2.5 mg total) by nebulization every 2 (two) hours as needed for wheezing or shortness of breath., Disp: 75 mL, Rfl: 12   albuterol  (VENTOLIN  HFA) 108 (90 Base) MCG/ACT inhaler, Inhale 1 puff into the lungs every 6 (six) hours as needed for shortness of breath., Disp: , Rfl:    benzonatate  (TESSALON ) 100 MG capsule, Take 1 capsule (100 mg total) by mouth every 8 (eight) hours., Disp: 21 capsule, Rfl: 0   Budeson-Glycopyrrol-Formoterol  (BREZTRI  AEROSPHERE) 160-9-4.8 MCG/ACT AERO, Inhale 2 puffs into the lungs 2 (two) times daily as needed (shortness of breath/ COPD)., Disp: 1 each, Rfl: 11   clotrimazole  (MYCELEX ) 10 MG troche, Take 1 tablet (10 mg total) by mouth 5 (five) times daily., Disp: 35 Troche, Rfl: 0   denosumab  (PROLIA ) 60 MG/ML SOSY injection, Inject 60 mg as directed every 6 (six) months., Disp: , Rfl:    diphenhydramine -acetaminophen  (TYLENOL  PM) 25-500 MG TABS tablet, Take 1 tablet by mouth at bedtime., Disp: , Rfl:    Evolocumab  (REPATHA  SURECLICK) 140 MG/ML SOAJ, INJECT 1 PEN INTO THE SKIN EVERY 14 DAYS, Disp: 6 mL, Rfl: 1   ezetimibe  (ZETIA ) 10 MG tablet, Take 1 tablet by mouth once daily, Disp: 90 tablet, Rfl: 2   ipratropium-albuterol  (DUONEB) 0.5-2.5 (3) MG/3ML SOLN, Take 3 mLs by nebulization every 6 (six) hours as needed., Disp: 360 mL, Rfl: 2   lisinopril -hydrochlorothiazide  (ZESTORETIC ) 20-25 MG tablet, Take 0.5 tablets by mouth in the morning. (Patient taking differently: Take 0.5 tablets by mouth daily.), Disp: , Rfl:    Magnesium  Cl-Calcium  Carbonate (SLOW-MAG PO), Take 1 tablet by mouth 2 (two) times daily., Disp: , Rfl:    nitroGLYCERIN  (NITROSTAT ) 0.4 MG SL tablet, Place 0.4 mg under the tongue every 5 (five) minutes as needed for chest pain., Disp: , Rfl:    ondansetron  (ZOFRAN -ODT) 8 MG disintegrating tablet, Take 1 tablet (8 mg total) by mouth every  8 (eight) hours as needed for nausea or vomiting., Disp: 12 tablet, Rfl: 0   oseltamivir  (TAMIFLU ) 30 MG capsule, Take 1 capsule (30 mg total) by mouth 2 (two) times daily., Disp: 10 capsule, Rfl: 0   rosuvastatin  (CRESTOR ) 5 MG tablet, Take 1 tablet (5 mg total) by mouth 3 (three) times a week., Disp: 45 tablet, Rfl: 3   tiZANidine  (ZANAFLEX ) 2 MG tablet, Take 2 mg by mouth at bedtime., Disp: , Rfl:    apixaban  (ELIQUIS ) 2.5 MG TABS tablet, Take 1 tablet (2.5 mg total) by mouth 2 (two) times daily., Disp: 180 tablet, Rfl: 3   metoprolol  succinate (TOPROL -XL) 50 MG 24 hr tablet, Take 1 tablet (50 mg total) by mouth daily., Disp: 90 tablet, Rfl: 3  Consent:   NA  Disposition:   42-month follow-up sooner if needed  Her questions and concerns were addressed to her satisfaction. She voices understanding of the recommendations provided during this encounter.    Signed, Madonna Michele HAS, [facility]  A Division of [facility] [address] [place], [place] [address]  08/09/2024

## 2024-08-09 NOTE — Patient Instructions (Addendum)
 Medication Instructions:  Your physician has recommended you make the following change in your medication:   -Stop taking metoprolol  tartrate (lopressor ).  -Increase metoprolol  succinate (Toprol -XL) 50mg  once daily.  *If you need a refill on your cardiac medications before your next appointment, please call your pharmacy*   Testing/Procedures: Your physician has requested that you have an echocardiogram. Echocardiography is a painless test that uses sound waves to create images of your heart. It provides your doctor with information about the size and shape of your heart and how well your heart's chambers and valves are working. This procedure takes approximately one hour. There are no restrictions for this procedure. Please do NOT wear cologne, perfume, aftershave, or lotions (deodorant is allowed). Please arrive 15 minutes prior to your appointment time.  Please note: We ask at that you not bring children with you during ultrasound (echo/ vascular) testing. Due to room size and safety concerns, children are not allowed in the ultrasound rooms during exams. Our front office staff cannot provide observation of children in our lobby area while testing is being conducted. An adult accompanying a patient to their appointment will only be allowed in the ultrasound room at the discretion of the ultrasound technician under special circumstances. We apologize for any inconvenience. **To do in March 2026**   Follow-Up: At [facility], you and your health needs are our priority.  As part of our continuing mission to provide you with exceptional heart care, our providers are all part of one team.  This team includes your primary Cardiologist (physician) and Advanced Practice Providers or APPs (Physician Assistants and Nurse Practitioners) who all work together to provide you with the care you need, when you need it.  Your next appointment:   6 month(s)  Provider:   Madonna Large, DO    We  recommend signing up for the patient portal called MyChart.  Sign up information is provided on this After Visit Summary.  MyChart is used to connect with patients for Virtual Visits (Telemedicine).  Patients are able to view lab/test results, encounter notes, upcoming appointments, etc.  Non-urgent messages can be sent to your provider as well.   To learn more about what you can do with MyChart, go to [email or web address].   Other Instructions Please re-establish with [facility]- Dr. Gretta for peripheral artery disease.

## 2024-09-29 ENCOUNTER — Emergency Department (HOSPITAL_COMMUNITY)

## 2024-09-29 ENCOUNTER — Other Ambulatory Visit: Payer: Self-pay

## 2024-09-29 ENCOUNTER — Encounter (HOSPITAL_COMMUNITY): Payer: Self-pay

## 2024-09-29 ENCOUNTER — Emergency Department (HOSPITAL_COMMUNITY)
Admission: EM | Admit: 2024-09-29 | Discharge: 2024-09-29 | Disposition: A | Discharge status: Home / Self Care | Attending: Emergency Medicine | Admitting: Emergency Medicine

## 2024-09-29 DIAGNOSIS — N189 Chronic kidney disease, unspecified: Secondary | ICD-10-CM | POA: Insufficient documentation

## 2024-09-29 DIAGNOSIS — J449 Chronic obstructive pulmonary disease, unspecified: Secondary | ICD-10-CM | POA: Insufficient documentation

## 2024-09-29 DIAGNOSIS — R0602 Shortness of breath: Secondary | ICD-10-CM | POA: Diagnosis not present

## 2024-09-29 DIAGNOSIS — R079 Chest pain, unspecified: Secondary | ICD-10-CM | POA: Diagnosis not present

## 2024-09-29 DIAGNOSIS — I2510 Atherosclerotic heart disease of native coronary artery without angina pectoris: Secondary | ICD-10-CM | POA: Diagnosis not present

## 2024-09-29 DIAGNOSIS — I130 Hypertensive heart and chronic kidney disease with heart failure and stage 1 through stage 4 chronic kidney disease, or unspecified chronic kidney disease: Secondary | ICD-10-CM | POA: Insufficient documentation

## 2024-09-29 DIAGNOSIS — Z7901 Long term (current) use of anticoagulants: Secondary | ICD-10-CM | POA: Diagnosis not present

## 2024-09-29 DIAGNOSIS — R072 Precordial pain: Secondary | ICD-10-CM | POA: Insufficient documentation

## 2024-09-29 DIAGNOSIS — I509 Heart failure, unspecified: Secondary | ICD-10-CM | POA: Insufficient documentation

## 2024-09-29 LAB — CBC
HCT: 36.2 % (ref 36.0–46.0)
Hemoglobin: 11.2 g/dL — ABNORMAL LOW (ref 12.0–15.0)
MCH: 31.5 pg (ref 26.0–34.0)
MCHC: 30.9 g/dL (ref 30.0–36.0)
MCV: 101.7 fL — ABNORMAL HIGH (ref 80.0–100.0)
Platelets: 227 K/uL (ref 150–400)
RBC: 3.56 MIL/uL — ABNORMAL LOW (ref 3.87–5.11)
RDW: 13.3 % (ref 11.5–15.5)
WBC: 8.9 K/uL (ref 4.0–10.5)
nRBC: 0 % (ref 0.0–0.2)

## 2024-09-29 LAB — BASIC METABOLIC PANEL WITH GFR
Anion gap: 7 (ref 5–15)
BUN: 16 mg/dL (ref 8–23)
CO2: 21 mmol/L — ABNORMAL LOW (ref 22–32)
Calcium: 9.1 mg/dL (ref 8.9–10.3)
Chloride: 114 mmol/L — ABNORMAL HIGH (ref 98–111)
Creatinine, Ser: 0.83 mg/dL (ref 0.44–1.00)
GFR, Estimated: >60 mL/min (ref >60)
Glucose, Bld: 101 mg/dL — ABNORMAL HIGH (ref 70–99)
Potassium: 4.1 mmol/L (ref 3.5–5.1)
Sodium: 142 mmol/L (ref 135–145)

## 2024-09-29 LAB — TROPONIN I (HIGH SENSITIVITY)
Troponin I (High Sensitivity): 7 ng/L (ref <18)
Troponin I (High Sensitivity): 7 ng/L (ref <18)

## 2024-09-29 NOTE — ED Provider Notes (Signed)
 [facility] AT [facility] Provider Note  CSN: [id number] Arrival date & time: 09/29/24 1031  Chief Complaint(s) Chest Pain  HPI Sydney Gross is a 89 y.o. female history of COPD, hypertension, lipidemia, atrial fibrillation on Eliquis , CKD, CHF presenting with episode of chest pain.  Patient reports that she developed chest pain this morning.  Reports that she woke up, was getting out of bed when she felt substernal chest pain.  Reports it was a pressure.  Denies any associated symptoms, specifically denies any nausea or vomiting, diaphoresis, shortness of breath, lightheadedness or dizziness, abdominal pain, pain in the arms or legs.  Chest pain did not radiate.  Denies back pain.  Symptoms were mild to moderate.  She took a nitroglycerin  that she had at home and reports symptoms slowly subsided following this.  Reports now she is currently not having any symptoms.   Past Medical History Past Medical History:  Diagnosis Date   Allergic rhinitis    Arthritis    maybe a little in my knees and back (07/17/2018)   Chronic lower back pain    Colitis 12/2013   never had this before   COPD (chronic obstructive pulmonary disease) (HCC)    questionable/Dr. Claudene (07/17/2018)   Coronary artery disease    GI bleed    H/O calcium  pyrophosphate deposition disease (CPPD)    Hiatal hernia    Hyperlipidemia    Hypertension    Mild mitral regurgitation    On home oxygen  therapy    I've used it 2 times in 2 yrs (07/17/2018)   PAD (peripheral artery disease)    PAF (paroxysmal atrial fibrillation) (HCC)    Pulmonary hypertension (HCC)    Shingles    Patient Active Problem List   Diagnosis Date Noted   Hematoma 11/26/2022   Nausea, vomiting and diarrhea 09/12/2022   Elevated troponin 09/11/2022   Hyponatremia 09/11/2022   Vertigo 09/11/2022   COPD with acute exacerbation (HCC) 08/16/2022   Diverticulitis 06/17/2022   Paroxysmal atrial fibrillation  (HCC) 06/16/2022   Atrial fibrillation with rapid ventricular response (HCC) 06/16/2022   Atrial fibrillation with RVR (HCC) 06/16/2022   COVID-19 10/05/2021   Abdominal pain 10/05/2021   Gastric ulcer 04/21/2021   Duodenitis 04/21/2021   Gastrointestinal bleed 04/21/2021   Melena 04/19/2021   Hyperlipidemia 09/14/2019   Statin myopathy 09/14/2019   Chronic diastolic CHF (congestive heart failure) (HCC) 08/02/2018   Coronary artery disease of native artery of native heart with stable angina pectoris    COPD (chronic obstructive pulmonary disease) (HCC) 11/18/2016   PAD (peripheral artery disease) 11/18/2016   Stage 3a chronic kidney disease (CKD) (HCC) 10/29/2015   Bradycardia    Dilated aortic root (HCC) 01/27/2015   Dyspnea on exertion 12/16/2014   Aortic stenosis, supravalvular 12/16/2014   Atherosclerosis of native arteries of extremity with intermittent claudication 08/30/2014   Malnutrition of moderate degree 12/12/2013   Hypertension 12/09/2013   Enteritis due to Clostridium difficile 12/05/2013   Home Medication(s) Prior to Admission medications   Medication Sig Start Date End Date Taking? Authorizing Provider  albuterol  (PROVENTIL ) (2.5 MG/3ML) 0.083% nebulizer solution Take 3 mLs (2.5 mg total) by nebulization every 2 (two) hours as needed for wheezing or shortness of breath. 08/17/22   Vann, Jessica U, DO  albuterol  (VENTOLIN  HFA) 108 (90 Base) MCG/ACT inhaler Inhale 1 puff into the lungs every 6 (six) hours as needed for shortness of breath. 08/11/22   [provider]  apixaban  (ELIQUIS )  2.5 MG TABS tablet Take 1 tablet (2.5 mg total) by mouth 2 (two) times daily. 08/09/24   Tolia, Sunit, DO  benzonatate  (TESSALON ) 100 MG capsule Take 1 capsule (100 mg total) by mouth every 8 (eight) hours. 12/09/23   Randol Simmonds, MD  Budeson-Glycopyrrol-Formoterol  (BREZTRI  AEROSPHERE) 160-9-4.8 MCG/ACT AERO Inhale 2 puffs into the lungs 2 (two) times daily as needed (shortness of breath/  COPD). 04/14/23   Hunsucker, Donnice SAUNDERS, MD  clotrimazole  (MYCELEX ) 10 MG troche Take 1 tablet (10 mg total) by mouth 5 (five) times daily. 07/30/24   Parrett, Madelin RAMAN, NP  denosumab  (PROLIA ) 60 MG/ML SOSY injection Inject 60 mg as directed every 6 (six) months.    [provider]  diphenhydramine -acetaminophen  (TYLENOL  PM) 25-500 MG TABS tablet Take 1 tablet by mouth at bedtime.    [provider]  Evolocumab  (REPATHA  SURECLICK) 140 MG/ML SOAJ INJECT 1 PEN INTO THE SKIN EVERY 14 DAYS 12/14/23   Swinyer, Rosaline HERO, NP  ezetimibe  (ZETIA ) 10 MG tablet Take 1 tablet by mouth once daily 05/15/24   Swinyer, Michelle M, NP  ipratropium-albuterol  (DUONEB) 0.5-2.5 (3) MG/3ML SOLN Take 3 mLs by nebulization every 6 (six) hours as needed. 10/19/22   Hunsucker, Donnice SAUNDERS, MD  lisinopril -hydrochlorothiazide  (ZESTORETIC ) 20-25 MG tablet Take 0.5 tablets by mouth in the morning. Patient taking differently: Take 0.5 tablets by mouth daily. 06/18/22   Vicci Rollo SAUNDERS, PA-C  Magnesium  Cl-Calcium  Carbonate (SLOW-MAG PO) Take 1 tablet by mouth 2 (two) times daily.    [provider]  metoprolol  succinate (TOPROL -XL) 50 MG 24 hr tablet Take 1 tablet (50 mg total) by mouth daily. 08/09/24   Tolia, Sunit, DO  nitroGLYCERIN  (NITROSTAT ) 0.4 MG SL tablet Place 0.4 mg under the tongue every 5 (five) minutes as needed for chest pain.    [provider]  ondansetron  (ZOFRAN -ODT) 8 MG disintegrating tablet Take 1 tablet (8 mg total) by mouth every 8 (eight) hours as needed for nausea or vomiting. 12/09/23   Randol Simmonds, MD  oseltamivir  (TAMIFLU ) 30 MG capsule Take 1 capsule (30 mg total) by mouth 2 (two) times daily. 12/12/23     tiZANidine  (ZANAFLEX ) 2 MG tablet Take 2 mg by mouth at bedtime. 11/17/22   [provider]                                                                                                                                    Past Surgical History Past Surgical History:   Procedure Laterality Date   ABDOMINAL AORTOGRAM W/LOWER EXTREMITY N/A 07/10/2021   Procedure: ABDOMINAL AORTOGRAM W/LOWER EXTREMITY;  Surgeon: Harvey Carlin BRAVO, MD;  Location: [facility] INVASIVE CV LAB;  Service: Cardiovascular;  Laterality: N/A;   ABDOMINAL HYSTERECTOMY  1960's   APPENDECTOMY     BACK SURGERY     BIOPSY  04/20/2021   Procedure: BIOPSY;  Surgeon: Rollin Dover, MD;  Location: [facility] ENDOSCOPY;  Service:  Endoscopy;;   BIOPSY  09/14/2022   Procedure: BIOPSY;  Surgeon: Rollin Dover, MD;  Location: [facility] ENDOSCOPY;  Service: Gastroenterology;;   BLEPHAROPLASTY Bilateral 05/2017   CARDIAC CATHETERIZATION     CATARACT EXTRACTION, BILATERAL Bilateral    CESAREAN SECTION  1957; 1960   CORONARY STENT INTERVENTION N/A 07/17/2018   Procedure: CORONARY STENT INTERVENTION;  Surgeon: Claudene Victory ORN, MD;  Location: [facility] INVASIVE CV LAB;  Service: Cardiovascular;  Laterality: N/A;   DACROCYSTORHINOSTOMY W/ JONES TUBE Bilateral 05/2017   put tear duct in my eyes   ESOPHAGOGASTRODUODENOSCOPY (EGD) WITH PROPOFOL  N/A 04/20/2021   Procedure: ESOPHAGOGASTRODUODENOSCOPY (EGD) WITH PROPOFOL ;  Surgeon: Rollin Dover, MD;  Location: [facility] ENDOSCOPY;  Service: Endoscopy;  Laterality: N/A;   ESOPHAGOGASTRODUODENOSCOPY (EGD) WITH PROPOFOL  N/A 09/14/2022   Procedure: ESOPHAGOGASTRODUODENOSCOPY (EGD) WITH PROPOFOL ;  Surgeon: Rollin Dover, MD;  Location: [facility] ENDOSCOPY;  Service: Gastroenterology;  Laterality: N/A;  CAD, COPD, PAF   EYE SURGERY     LAPAROSCOPIC CHOLECYSTECTOMY  ~ 2009   LUMBAR DISC SURGERY  ?2004   PERIPHERAL VASCULAR INTERVENTION Left 07/10/2021   Procedure: PERIPHERAL VASCULAR INTERVENTION;  Surgeon: Harvey Carlin BRAVO, MD;  Location: [facility] INVASIVE CV LAB;  Service: Cardiovascular;  Laterality: Left;  common, external iliac arteries   RIGHT/LEFT HEART CATH AND CORONARY ANGIOGRAPHY N/A 07/17/2018   Procedure: RIGHT/LEFT HEART CATH AND CORONARY ANGIOGRAPHY;  Surgeon: Claudene Victory ORN, MD;  Location: [facility] INVASIVE  CV LAB;  Service: Cardiovascular;  Laterality: N/A;   TONSILLECTOMY AND ADENOIDECTOMY  ?1940's   Family History Family History  Problem Relation Age of Onset   Emphysema Father 74   CVA Mother    Cancer Brother        ? type    Social History Social History   Tobacco Use   Smoking status: Never    Passive exposure: Past   Smokeless tobacco: Never  Vaping Use   Vaping status: Never Used  Substance Use Topics   Alcohol use: Yes    Alcohol/week: 4.0 standard drinks of alcohol    Types: 4 Glasses of wine per week    Comment: glass of wine every other night   Drug use: Never   Allergies Brilinta  [ticagrelor ], Codeine, Darvon [propoxyphene], Lipitor [atorvastatin], Rosuvastatin , Simvastatin, Other, and Tramadol  hcl  Review of Systems Review of Systems  All other systems reviewed and are negative.   Physical Exam Vital Signs  I have reviewed the triage vital signs BP (!) 159/69   Pulse 61   Temp 97.9 F (36.6 C) (Oral)   Resp 17   Ht 4' 11 (1.499 m)   Wt 44.5 kg   SpO2 100%   BMI 19.79 kg/m  Physical Exam Constitutional:      General: She is not in acute distress.    Appearance: She is well-developed.  HENT:     Head: Normocephalic and atraumatic.     Right Ear: External ear normal.     Left Ear: External ear normal.     Mouth/Throat:     Mouth: Mucous membranes are moist.  Eyes:     Conjunctiva/sclera: Conjunctivae normal.     Pupils: Pupils are equal, round, and reactive to light.  Cardiovascular:     Rate and Rhythm: Normal rate and regular rhythm.     Pulses: Normal pulses.          Carotid pulses are 2+ on the right side and 2+ on the left side.    Heart sounds: No murmur heard. Pulmonary:  Effort: Pulmonary effort is normal. No respiratory distress.     Breath sounds: Normal breath sounds.  Abdominal:     General: Abdomen is flat.     Palpations: Abdomen is soft.     Tenderness: There is no abdominal tenderness.  Musculoskeletal:      Cervical back: No tenderness.  Skin:    General: Skin is warm and dry.     Capillary Refill: Capillary refill takes less than 2 seconds.  Neurological:     General: No focal deficit present.     Mental Status: She is alert. Mental status is at baseline.  Psychiatric:        Mood and Affect: Mood normal.        Behavior: Behavior normal.     ED Results and Treatments Labs (all labs ordered are listed, but only abnormal results are displayed) Labs Reviewed  BASIC METABOLIC PANEL WITH GFR - Abnormal; Notable for the following components:      Result Value   Chloride 114 (*)    CO2 21 (*)    Glucose, Bld 101 (*)    All other components within normal limits  CBC - Abnormal; Notable for the following components:   RBC 3.56 (*)    Hemoglobin 11.2 (*)    MCV 101.7 (*)    All other components within normal limits  TROPONIN I (HIGH SENSITIVITY)  TROPONIN I (HIGH SENSITIVITY)                                                                                                                          Radiology DG Chest Portable 1 View Result Date: 09/29/2024 CLINICAL DATA:  Non radiating chest pain and shortness of breath. EXAM: PORTABLE CHEST 1 VIEW COMPARISON:  July 30, 2024 FINDINGS: The heart size and mediastinal contours are within normal limits. There is marked severity calcification of the aortic arch. No acute infiltrate, pleural effusion or pneumothorax is identified. There is marked severity dextroscoliosis of the lower thoracic and upper lumbar spine. No acute osseous abnormalities are identified. IMPRESSION: No active disease. Electronically Signed   By: Suzen Dials M.D.   On: 09/29/2024 12:07    Pertinent labs & imaging results that were available during my care of the patient were reviewed by me and considered in my medical decision making (see MDM for details).  Medications Ordered in ED Medications - No data to display  Procedures Procedures  (including critical care time)  Medical Decision Making / ED Course   MDM:  89 year old presenting to the emergency department with episode of chest pain.  Patient overall well-appearing, physical examination unremarkable.  Equal pulses.  EKG shows normal sinus rhythm without concerning changes.  Differential includes gastritis, GERD, ACS, pneumothorax, pneumonia, musculoskeletal pain.  Considered process such as dissection but equal pulses, patient no distress, asymptomatic, will check chest x-ray to evaluate cardiomediastinal silhouette for.  Differential includes pulmonary embolism but low concern for this, pain resolved, no pleuritic pain, no hypoxia, and patient is on chronic Eliquis  therapy.  Will check troponin x 2 given symptoms began this morning.  If workup is reassuring, patient continues to be asymptomatic, likely outpatient follow-up with cardiology.  Clinical Course as of 09/29/24 1358  Sat Sep 29, 2024  1355 Patient continues to be asymptomatic.  She has had 2 negative troponins.  Laboratory testing otherwise is reassuring.  Feel at this time patient is stable for discharge with outpatient follow-up.  I placed cardiology referral for expedited appointment. Will discharge patient to home. All questions answered. Patient comfortable with plan of discharge. Return precautions discussed with patient and specified on the after visit summary.  [WS]    Clinical Course User Index [WS] Francesca Elsie CROME, MD     Additional history obtained: -Additional history obtained from family and ems -External records from outside source obtained and reviewed including: Chart review including previous notes, labs, imaging, consultation notes including prior notes    Lab Tests: -I ordered, reviewed, and interpreted labs.   The pertinent results include:   Labs Reviewed  BASIC METABOLIC  PANEL WITH GFR - Abnormal; Notable for the following components:      Result Value   Chloride 114 (*)    CO2 21 (*)    Glucose, Bld 101 (*)    All other components within normal limits  CBC - Abnormal; Notable for the following components:   RBC 3.56 (*)    Hemoglobin 11.2 (*)    MCV 101.7 (*)    All other components within normal limits  TROPONIN I (HIGH SENSITIVITY)  TROPONIN I (HIGH SENSITIVITY)    Notable for normal troponin x2  EKG   EKG Interpretation Date/Time:  Saturday September 29 2024 10:51:05 EDT Ventricular Rate:  89 PR Interval:  166 QRS Duration:  89 QT Interval:  431 QTC Calculation: 455 R Axis:   34  Text Interpretation: Sinus rhythm Artifact in leads I, II,III, aVR, aVL, AVF Confirmed by Francesca Elsie ([id number]) on 09/29/2024 11:28:43 AM      NO stemi. Reviewed paramedic ecg without artifact which reveals normal findings without concerning ST change   Imaging Studies ordered: I ordered imaging studies including CXR On my interpretation imaging demonstrates no acute process I independently visualized and interpreted imaging. I agree with the radiologist interpretation   Medicines ordered and prescription drug management: No orders of the defined types were placed in this encounter.   -I have reviewed the patients home medicines and have made adjustments as needed   Reevaluation: After the interventions noted above, I reevaluated the patient and found that their symptoms have resolved  Co morbidities that complicate the patient evaluation  Past Medical History:  Diagnosis Date   Allergic rhinitis    Arthritis    maybe a little in my knees and back (07/17/2018)   Chronic lower back pain    Colitis 12/2013   never had this before   COPD (chronic  obstructive pulmonary disease) (HCC)    questionable/Dr. Claudene (07/17/2018)   Coronary artery disease    GI bleed    H/O calcium  pyrophosphate deposition disease (CPPD)    Hiatal hernia     Hyperlipidemia    Hypertension    Mild mitral regurgitation    On home oxygen  therapy    I've used it 2 times in 2 yrs (07/17/2018)   PAD (peripheral artery disease)    PAF (paroxysmal atrial fibrillation) (HCC)    Pulmonary hypertension (HCC)    Shingles       Dispostion: Disposition decision including need for hospitalization was considered, and patient discharged from emergency department.    Final Clinical Impression(s) / ED Diagnoses Final diagnoses:  Nonspecific chest pain     This chart was dictated using voice recognition software.  Despite best efforts to proofread,  errors can occur which can change the documentation meaning.    Francesca Elsie CROME, MD 09/29/24 [phone]

## 2024-09-29 NOTE — ED Triage Notes (Addendum)
 C/O non-radiating CP and SHOB that started this morning. Pt took 0.4 nitroglycerin  and 324mg  mg aspirin .  BP 210 systolic. Axox4. Denies n/v

## 2024-09-29 NOTE — Discharge Instructions (Addendum)
 We evaluated you for your chest pain.  We do not know the exact cause of your symptoms.  Your testing including your laboratory tests as well as cardiac enzyme testing, EKG and physical exam were reassuring.  Since your testing is reassuring and your symptoms have resolved, we feel that it is safe to go home.  We would like you to follow-up with your cardiologist.  We have placed a referral to help facilitate an expedited appointment.  Please return if you develop any new or worsening symptoms such as severe worsening chest pain, difficulty breathing, lightheadedness or dizziness, fevers or chills, cough, or any other new or concerning symptoms.

## 2024-09-30 DIAGNOSIS — R079 Chest pain, unspecified: Secondary | ICD-10-CM | POA: Diagnosis not present

## 2024-10-04 ENCOUNTER — Ambulatory Visit
Admission: RE | Admit: 2024-10-04 | Discharge: 2024-10-04 | Disposition: A | Discharge status: Home / Self Care | Source: Ambulatory Visit | Attending: Family Medicine | Admitting: Family Medicine

## 2024-10-04 ENCOUNTER — Other Ambulatory Visit: Payer: Self-pay | Admitting: Family Medicine

## 2024-10-04 DIAGNOSIS — R0781 Pleurodynia: Secondary | ICD-10-CM | POA: Diagnosis not present

## 2024-10-04 DIAGNOSIS — M549 Dorsalgia, unspecified: Secondary | ICD-10-CM | POA: Diagnosis not present

## 2024-10-04 DIAGNOSIS — R071 Chest pain on breathing: Secondary | ICD-10-CM | POA: Diagnosis not present

## 2024-10-04 MED ORDER — IOPAMIDOL (ISOVUE-300) INJECTION 61%
100.0000 mL | Freq: Once | INTRAVENOUS | Status: AC | PRN
  Administered 2024-10-04: 100 mL via INTRAVENOUS

## 2024-10-08 ENCOUNTER — Ambulatory Visit (HOSPITAL_BASED_OUTPATIENT_CLINIC_OR_DEPARTMENT_OTHER)

## 2024-10-08 ENCOUNTER — Ambulatory Visit: Payer: Self-pay

## 2024-10-08 ENCOUNTER — Encounter (HOSPITAL_BASED_OUTPATIENT_CLINIC_OR_DEPARTMENT_OTHER): Payer: Self-pay

## 2024-10-08 VITALS — BP 184/79 | Ht 59.0 in | Wt 103.0 lb

## 2024-10-08 DIAGNOSIS — Z23 Encounter for immunization: Secondary | ICD-10-CM | POA: Diagnosis not present

## 2024-10-08 DIAGNOSIS — I471 Supraventricular tachycardia, unspecified: Secondary | ICD-10-CM

## 2024-10-08 DIAGNOSIS — R0609 Other forms of dyspnea: Secondary | ICD-10-CM

## 2024-10-08 DIAGNOSIS — J4540 Moderate persistent asthma, uncomplicated: Secondary | ICD-10-CM | POA: Diagnosis not present

## 2024-10-08 MED ORDER — ALBUTEROL SULFATE HFA 108 (90 BASE) MCG/ACT IN AERS: 2.0000 | INHALATION_SPRAY | Freq: Four times a day (QID) | RESPIRATORY_TRACT | 5 refills | Status: AC | PRN

## 2024-10-08 MED ORDER — OMEPRAZOLE 20 MG PO CPDR: 20.0000 mg | DELAYED_RELEASE_CAPSULE | Freq: Every day | ORAL | 11 refills | Status: AC

## 2024-10-08 NOTE — Progress Notes (Signed)
 @Patient  ID: Sydney Gross, female    DOB: 09-05-1930, 89 y.o.   MRN: [id number]  No chief complaint on file.   Referring provider: Loreli Kins, MD  HPI: Discussed the use of AI scribe software for clinical note transcription with the patient, who gave verbal consent to proceed.  History of Present Illness [name] is a 89 year old female with COPD and asthma who presents with worsening shortness of breath. She is accompanied by her daughter, Joen.  Over the past two weeks, she has experienced worsening shortness of breath, particularly with exertion such as walking to the bathroom at night. The dyspnea is severe enough that she struggles to return to bed. She has been using Breztri  more frequently than prescribed, approximately four to five times daily instead of the recommended twice daily. Her symptoms worsened following an episode two weeks ago when she was taken to the hospital for suspected cardiac issues. A CT scan and cardiac workup were negative for acute cardiac problems.  She has a history of COPD and asthma, with previous positive response to bronchodilators on PFTs. Currently, she uses Breztri  as her primary inhaler and has not used albuterol  recently, although she has used it in the past. She is on Eliquis  for anticoagulation following a previous episode of atrial flutter or SVT, which required cardioversion. She is not using a nebulizer at present.  Two weeks ago, she experienced right-sided chest pain that worsened with eating and has since resolved with omeprazole. She has not had any chest pain in the last few days after starting the Omeprazole.  Respiratory symptoms include a runny nose and a cough at night, which has improved over the last four to five days. She has been sleeping in a recliner due to discomfort when lying flat but has recently returned to sleeping in her bed. No fever, chills, or significant changes in appetite, although she notes she  'never eats very good anyway.'  Her social history includes exposure to secondhand smoke from her father, who was a heavy smoker. She has never smoked herself. Her family history includes asthma, as her father had asthma but no emphysema.     TEST/EVENTS :  09/29/2024:  ER visit for chest pain; negative PE study and troponins  07/30/2024:  s/p cardioversion for sustained SVT   PFTs November 11, 2022 showed moderate obstruction, reversibility (positive bronchodilator response) FEV1 80%, ratio 69, FVC 83%, 11% bronchodilator change, DLCO 75%  Allergies  Allergen Reactions   Brilinta  [Ticagrelor ] Shortness Of Breath   Codeine Nausea And Vomiting   Darvon [Propoxyphene] Nausea Only   Lipitor [Atorvastatin] Other (See Comments)    MYALGIAS   Rosuvastatin  Other (See Comments)    MYALGIAS    Simvastatin Other (See Comments)    MYALGIAS    Other Other (See Comments)   Tramadol  Hcl Nausea Only    nausea (08/2019)    Immunization History  Administered Date(s) Administered   Fluad Quad(high Dose 65+) 09/15/2022   Influenza,inj,Quad PF,6+ Mos 12/11/2013   PFIZER(Purple Top)SARS-COV-2 Vaccination 12/26/2019, 01/13/2020   Tdap 11/08/2022    Past Medical History:  Diagnosis Date   Allergic rhinitis    Arthritis    maybe a little in my knees and back (07/17/2018)   Chronic lower back pain    Colitis 12/2013   never had this before   COPD (chronic obstructive pulmonary disease) (HCC)    questionable/Dr. Claudene (07/17/2018)   Coronary artery disease    GI bleed  H/O calcium  pyrophosphate deposition disease (CPPD)    Hiatal hernia    Hyperlipidemia    Hypertension    Mild mitral regurgitation    On home oxygen  therapy    I've used it 2 times in 2 yrs (07/17/2018)   PAD (peripheral artery disease)    PAF (paroxysmal atrial fibrillation) (HCC)    Pulmonary hypertension (HCC)    Shingles     Tobacco History: Social History   Tobacco Use  Smoking Status Never    Passive exposure: Past  Smokeless Tobacco Never   Counseling given: Not Answered   Outpatient Medications Prior to Visit  Medication Sig Dispense Refill   apixaban  (ELIQUIS ) 2.5 MG TABS tablet Take 1 tablet (2.5 mg total) by mouth 2 (two) times daily. 180 tablet 3   benzonatate  (TESSALON ) 100 MG capsule Take 1 capsule (100 mg total) by mouth every 8 (eight) hours. 21 capsule 0   Budeson-Glycopyrrol-Formoterol  (BREZTRI  AEROSPHERE) 160-9-4.8 MCG/ACT AERO Inhale 2 puffs into the lungs 2 (two) times daily as needed (shortness of breath/ COPD). 1 each 11   clotrimazole  (MYCELEX ) 10 MG troche Take 1 tablet (10 mg total) by mouth 5 (five) times daily. 35 Troche 0   denosumab  (PROLIA ) 60 MG/ML SOSY injection Inject 60 mg as directed every 6 (six) months.     diphenhydramine -acetaminophen  (TYLENOL  PM) 25-500 MG TABS tablet Take 1 tablet by mouth at bedtime.     Evolocumab  (REPATHA  SURECLICK) 140 MG/ML SOAJ INJECT 1 PEN INTO THE SKIN EVERY 14 DAYS 6 mL 1   ezetimibe  (ZETIA ) 10 MG tablet Take 1 tablet by mouth once daily 90 tablet 2   lisinopril -hydrochlorothiazide  (ZESTORETIC ) 20-25 MG tablet Take 0.5 tablets by mouth in the morning. (Patient taking differently: Take 0.5 tablets by mouth daily.)     Magnesium  Cl-Calcium  Carbonate (SLOW-MAG PO) Take 1 tablet by mouth 2 (two) times daily.     metoprolol  succinate (TOPROL -XL) 50 MG 24 hr tablet Take 1 tablet (50 mg total) by mouth daily. 90 tablet 3   nitroGLYCERIN  (NITROSTAT ) 0.4 MG SL tablet Place 0.4 mg under the tongue every 5 (five) minutes as needed for chest pain.     ondansetron  (ZOFRAN -ODT) 8 MG disintegrating tablet Take 1 tablet (8 mg total) by mouth every 8 (eight) hours as needed for nausea or vomiting. 12 tablet 0   oseltamivir  (TAMIFLU ) 30 MG capsule Take 1 capsule (30 mg total) by mouth 2 (two) times daily. 10 capsule 0   tiZANidine  (ZANAFLEX ) 2 MG tablet Take 2 mg by mouth at bedtime.     albuterol  (PROVENTIL ) (2.5 MG/3ML) 0.083% nebulizer  solution Take 3 mLs (2.5 mg total) by nebulization every 2 (two) hours as needed for wheezing or shortness of breath. 75 mL 12   albuterol  (VENTOLIN  HFA) 108 (90 Base) MCG/ACT inhaler Inhale 1 puff into the lungs every 6 (six) hours as needed for shortness of breath.     ipratropium-albuterol  (DUONEB) 0.5-2.5 (3) MG/3ML SOLN Take 3 mLs by nebulization every 6 (six) hours as needed. 360 mL 2   No facility-administered medications prior to visit.     Review of Systems: as per HPI  Constitutional:   No  weight loss, night sweats,  Fevers, chills, fatigue, or  lassitude.  HEENT:   No headaches,  Difficulty swallowing,  Tooth/dental problems, or  Sore throat,                No sneezing, itching, ear ache, nasal congestion, post nasal drip,   CV:  No chest pain,  Orthopnea, PND, swelling in lower extremities, anasarca, dizziness, palpitations, syncope.   GI  No heartburn, indigestion, abdominal pain, nausea, vomiting, diarrhea, change in bowel habits, loss of appetite, bloody stools.   Resp: No shortness of breath with exertion or at rest.  No excess mucus, no productive cough,  No non-productive cough,  No coughing up of blood.  No change in color of mucus.  No wheezing.  No chest wall deformity  Skin: no rash or lesions.  GU: no dysuria, change in color of urine, no urgency or frequency.  No flank pain, no hematuria   MS:  No joint pain or swelling.  No decreased range of motion.  No back pain.    Physical Exam  BP (!) 184/79   Ht 4' 11 (1.499 m)   Wt 103 lb (46.7 kg)   SpO2 95%   BMI 20.80 kg/m   GEN: A/Ox3; pleasant , NAD, well nourished.  Speaks in full sentences.   HEENT:  [place]/AT,  EACs-clear, TMs-wnl, NOSE-clear, THROAT-clear, no lesions, no postnasal drip or exudate noted.   NECK:  Supple w/ fair ROM; no JVD; normal carotid impulses w/o bruits; no thyromegaly or nodules palpated; no lymphadenopathy.    RESP  Clear  P & A; w/o, wheezes/ rales/ or rhonchi. no accessory  muscle use, no dullness to percussion  CARD:  RRR, 2/6 SEM over LUSB, no peripheral edema, pulses intact, no cyanosis or clubbing.  GI:   Soft & nt; nml bowel sounds; no organomegaly or masses detected.   Musco: Warm bil, no deformities or joint swelling noted.   Neuro: alert, no focal deficits noted.    Skin: Warm, no lesions or rashes    Lab Results:  CBC    Component Value Date/Time   WBC 8.9 09/29/2024 1059   RBC 3.56 (L) 09/29/2024 1059   HGB 11.2 (L) 09/29/2024 1059   HGB 10.2 (L) 08/14/2021 0000   HCT 36.2 09/29/2024 1059   HCT 31.9 (L) 08/14/2021 0000   PLT 227 09/29/2024 1059   PLT 280 08/14/2021 0000   MCV 101.7 (H) 09/29/2024 1059   MCV 87 08/14/2021 0000   MCH 31.5 09/29/2024 1059   MCHC 30.9 09/29/2024 1059   RDW 13.3 09/29/2024 1059   RDW 15.8 (H) 08/14/2021 0000   LYMPHSABS 4.2 (H) 09/10/2022 1803   MONOABS 1.3 (H) 09/10/2022 1803   EOSABS 0.0 09/10/2022 1803   BASOSABS 0.0 09/10/2022 1803    BMET    Component Value Date/Time   NA 142 09/29/2024 1059   NA 142 08/14/2021 1634   K 4.1 09/29/2024 1059   CL 114 (H) 09/29/2024 1059   CO2 21 (L) 09/29/2024 1059   GLUCOSE 101 (H) 09/29/2024 1059   BUN 16 09/29/2024 1059   BUN 19 08/14/2021 1634   CREATININE 0.83 09/29/2024 1059   CALCIUM  9.1 09/29/2024 1059   GFRNONAA >60 09/29/2024 1059   GFRAA 36 (L) 01/29/2019 2115    BNP    Component Value Date/Time   BNP 70.4 08/16/2022 1637    ProBNP    Component Value Date/Time   PROBNP 645 08/14/2021 1634    Imaging: CT Angio Chest Pulmonary Embolism (PE) W or WO Contrast Result Date: 10/04/2024 EXAM: CTA of the Chest without and with contrast for PE 10/04/2024 02:08:14 PM TECHNIQUE: CTA of the chest was performed without and with the administration of 100 mL of iopamidol  (ISOVUE -300) 61% injection. Multiplanar reformatted images are provided for review. MIP images  are provided for review. Automated exposure control, iterative reconstruction,  and/or weight based adjustment of the mA/kV was utilized to reduce the radiation dose to as low as reasonably achievable. COMPARISON: X-ray 09/29/2024 and CT 08/24/2022. CLINICAL HISTORY: FINDINGS: PULMONARY ARTERIES: Pulmonary arteries are adequately opacified for evaluation. No pulmonary embolism. Main pulmonary artery is normal in caliber. MEDIASTINUM: The heart and pericardium demonstrate no acute abnormality. Coronary artery and aortic atherosclerotic calcification. LYMPH NODES: No mediastinal, hilar or axillary lymphadenopathy. LUNGS AND PLEURA: The lungs are without acute process. No focal consolidation or pulmonary edema. No pleural effusion or pneumothorax. 4 mm nodule in the right lower lobe on series 12 image 85 is stable since 2023 and benign. No follow up recommended. UPPER ABDOMEN: Limited images of the upper abdomen are unremarkable. SOFT TISSUES AND BONES: No acute bone or soft tissue abnormality. IMPRESSION: 1. No pulmonary embolism or acute pulmonary abnormality. Electronically signed by: Norman Gatlin MD 10/04/2024 02:29 PM EDT RP Workstation: [id number]   DG Chest Portable 1 View Result Date: 09/29/2024 CLINICAL DATA:  Non radiating chest pain and shortness of breath. EXAM: PORTABLE CHEST 1 VIEW COMPARISON:  July 30, 2024 FINDINGS: The heart size and mediastinal contours are within normal limits. There is marked severity calcification of the aortic arch. No acute infiltrate, pleural effusion or pneumothorax is identified. There is marked severity dextroscoliosis of the lower thoracic and upper lumbar spine. No acute osseous abnormalities are identified. IMPRESSION: No active disease. Electronically Signed   By: Suzen Dials M.D.   On: 09/29/2024 12:07    Administration History     None          Latest Ref Rng & Units 11/11/2022    2:56 PM  PFT Results  FVC-Pre L 1.23   FVC-Predicted Pre % 78   FVC-Post L 1.30   FVC-Predicted Post % 83   Pre FEV1/FVC % % 65   Post  FEV1/FCV % % 69   FEV1-Pre L 0.80   FEV1-Predicted Pre % 71   FEV1-Post L 0.90   DLCO uncorrected ml/min/mmHg 11.56   DLCO UNC% % 75   DLCO corrected ml/min/mmHg 11.56   DLCO COR %Predicted % 75   DLVA Predicted % 87   TLC L 3.99   TLC % Predicted % 92   RV % Predicted % 105     No results found for: NITRICOXIDE   Assessment & Plan:  ELIZETH WEINRICH is a 89 year old female with COPD and asthma who presents with worsening shortness of breath.  Her dyspnea is likely multifactorial in the setting of asthma, reflux, allergic rhinitis, CAD s/p stents, PVD, and aortic stenosis.  Chest pain resolved with addition of PPI indicating active reflux. Assessment & Plan Moderate persistent asthma, unspecified whether complicated  Dyspnea on exertion  SVT (supraventricular tachycardia)  Assessment and Plan Assessment & Plan Asthma with allergic rhinitis and gastroesophageal reflux disease Asthma with reversible airway reactivity, exacerbated by allergic rhinitis and GERD. GERD likely contributing to symptoms.  - Prescribed albuterol  as rescue inhaler, two puffs every 4-6 hours as needed. - Continue Breztri , two puffs twice daily. - Prescribed omeprazole 20 mg once daily. - Recommended OTC Claritin or Allegra.  Aortic valve stenosis Age-related aortic stenosis contributing to shortness of breath. Murmur present. Echocardiogram performed, cardiology follow-up needed. - Continue cardiology follow-up for echocardiogram and management.  History of supraventricular arrhythmia, post-cardioversion. On Eliquis . - Continue Eliquis  as prescribed.  Influenza vaccination, encounter Due for influenza vaccination. Discussed potential side effects. -  Administered influenza vaccination.    Return in about 3 months (around 01/08/2025).  Candis Dandy, PA-C 10/08/2024

## 2024-10-08 NOTE — Telephone Encounter (Signed)
 FYI Only or Action Required?: Action required by provider: request for appointment.  Patient was last seen in primary care on .  Called Nurse Triage reporting No chief complaint on file..  Symptoms began a week ago.  Interventions attempted: Prescription medications:  SABRA  Symptoms are: unchanged.  Triage Disposition: See HCP Within 4 Hours (Or PCP Triage)  Patient/caregiver understands and will follow disposition?: Yes    Copied from CRM [id number]. Topic: Clinical - Red Word Triage >> Oct 08, 2024  9:31 AM Ismael A wrote: Red Word that prompted transfer to Nurse Triage: patient is experiencing chest pain and SOB , states inhaler is not working Reason for Disposition  [1] MILD difficulty breathing (e.g., minimal/no SOB at rest, SOB with walking, pulse < 100) AND [2] NEW-onset or WORSE than normal  Answer Assessment - Initial Assessment Questions 1. RESPIRATORY STATUS: Describe your breathing? (e.g., wheezing, shortness of breath, unable to speak, severe coughing)      SOB, chest pain 2. ONSET: When did this breathing problem begin?      Last week 3. PATTERN Does the difficult breathing come and go, or has it been constant since it started?      Comes and goes 4. SEVERITY: How bad is your breathing? (e.g., mild, moderate, severe)      moderate 5. RECURRENT SYMPTOM: Have you had difficulty breathing before? If Yes, ask: When was the last time? and What happened that time?      yes 6. CARDIAC HISTORY: Do you have any history of heart disease? (e.g., heart attack, angina, bypass surgery, angioplasty)      yes 7. LUNG HISTORY: Do you have any history of lung disease?  (e.g., pulmonary embolus, asthma, emphysema)     Yes 8. CAUSE: What do you think is causing the breathing problem?      Unsure 9. OTHER SYMPTOMS: Do you have any other symptoms? (e.g., chest pain, cough, dizziness, fever, runny nose)     cough 10. O2 SATURATION MONITOR:  Do you use an oxygen   saturation monitor (pulse oximeter) at home? If Yes, ask: What is your reading (oxygen  level) today? What is your usual oxygen  saturation reading? (e.g., 95%)       no 11. PREGNANCY: Is there any chance you are pregnant? When was your last menstrual period?       no 12. TRAVEL: Have you traveled out of the country in the last month? (e.g., travel history, exposures)       no  Protocols used: Breathing Difficulty-A-AH

## 2024-10-08 NOTE — Patient Instructions (Addendum)
 Take Breztri  2 puffs inhaled twice daily.  Take Albuterol  2 puffs inhaled every 4-6 hours as needed for shortness of breath- this is your rescue inhaler.   Start Omeprazole 20mg  daily.  Start Claritin 10mg  daily (may obtain over the counter).  Follow up with Cardiology as recommended.  Follow up in our office in 3 months; return to clinic sooner if new or worsening complaints.  Flu shot given in office today.

## 2024-10-12 ENCOUNTER — Ambulatory Visit: Payer: Self-pay | Admitting: Pulmonary Disease

## 2024-10-12 NOTE — Telephone Encounter (Signed)
 FYI Only or Action Required?: FYI only for provider: ED advised.  Patient is followed in Pulmonology for COPD, last seen on 10/08/2024 by Charley Conger, PA-C.  Called Nurse Triage reporting Shortness of Breath.  Symptoms began 1-2 weeks ago.  Interventions attempted: Maintenance inhaler and Nebulizer treatments.  Symptoms are: worsening.  Triage Disposition: Go to ED Now (Notify PCP)  Patient/caregiver understands and will follow disposition?: Unsure      Copied from CRM [id number]. Topic: Clinical - Red Word Triage >> Oct 12, 2024  2:55 PM Celestine FALCON wrote: Red Word that prompted transfer to Nurse Triage: Pt's daughter Sydney Gross on DPR is calling on her mother's behalf due to the patient's breathing becoming worse.   Pt was seen at the [facility] on 10/08/2024 by PA-C Conger Charley. Conger provided the pt with a nebulizer medication, but the pt's breathing hasn't improved is it worse. Pt is gasping for air in the middle of the night or walking especially at any incline.      Reason for Disposition  [1] MODERATE difficulty breathing (e.g., speaks in phrases, SOB even at rest, pulse 100-120) AND [2] NEW-onset or WORSE than normal  Answer Assessment - Initial Assessment Questions Patient seen on 11/3 and had a nebulizer added to her medication. She states the patient's breathing has been worsening since she was seen.      1. RESPIRATORY STATUS: Describe your breathing? (e.g., wheezing, shortness of breath, unable to speak, severe coughing)      Difficulty breathing 2. ONSET: When did this breathing problem begin?      1-2 weeks ago 3. PATTERN Does the difficult breathing come and go, or has it been constant since it started?      Constant  4. SEVERITY: How bad is your breathing? (e.g., mild, moderate, severe)      Moderate  5. RECURRENT SYMPTOM: Have you had difficulty breathing before? If Yes, ask: When was the last time? and What happened that time?       Yes 6. CARDIAC HISTORY: Do you have any history of heart disease? (e.g., heart attack, angina, bypass surgery, angioplasty)      Yes 7. LUNG HISTORY: Do you have any history of lung disease?  (e.g., pulmonary embolus, asthma, emphysema)     COPD 8. CAUSE: What do you think is causing the breathing problem?      History of asthma  9. OTHER SYMPTOMS: Do you have any other symptoms? (e.g., chest pain, cough, dizziness, fever, runny nose)     No 10. O2 SATURATION MONITOR:  Do you use an oxygen  saturation monitor (pulse oximeter) at home? If Yes, ask: What is your reading (oxygen  level) today? What is your usual oxygen  saturation reading? (e.g., 95%)      Does not check  Protocols used: Breathing Difficulty-A-AH

## 2024-10-15 ENCOUNTER — Encounter: Payer: Self-pay | Admitting: Pulmonary Disease

## 2024-10-15 ENCOUNTER — Ambulatory Visit: Admitting: Pulmonary Disease

## 2024-10-15 VITALS — BP 120/76 | HR 62 | Temp 98.2°F | Ht 60.0 in | Wt 106.0 lb

## 2024-10-15 DIAGNOSIS — J4541 Moderate persistent asthma with (acute) exacerbation: Secondary | ICD-10-CM | POA: Insufficient documentation

## 2024-10-15 DIAGNOSIS — J45901 Unspecified asthma with (acute) exacerbation: Secondary | ICD-10-CM

## 2024-10-15 MED ORDER — BREZTRI AEROSPHERE 160-9-4.8 MCG/ACT IN AERO: 2.0000 | INHALATION_SPRAY | Freq: Two times a day (BID) | RESPIRATORY_TRACT | 11 refills | Status: AC

## 2024-10-15 MED ORDER — BREZTRI AEROSPHERE 160-9-4.8 MCG/ACT IN AERO: 2.0000 | INHALATION_SPRAY | Freq: Two times a day (BID) | RESPIRATORY_TRACT | Status: AC

## 2024-10-15 MED ORDER — IPRATROPIUM BROMIDE 0.02 % IN SOLN: 0.5000 mg | Freq: Four times a day (QID) | RESPIRATORY_TRACT | 4 refills | Status: AC | PRN

## 2024-10-15 MED ORDER — PREDNISONE 20 MG PO TABS: 20.0000 mg | ORAL_TABLET | Freq: Every day | ORAL | 0 refills | Status: AC

## 2024-10-15 NOTE — Patient Instructions (Signed)
 Nice to see you again  Take prednisone  20 mg (1 tablet) once a day for 5 days for wheeze and shortness of breath  Continue Breztri  2 puffs twice a day, I refilled this today  Use Atrovent  and ipratropium nebulizer solution every 6 hours as needed for wheeze or shortness of breath  Do not use albuterol   Return to clinic in 1 month or sooner as needed with Dr. Annella or APP

## 2024-10-15 NOTE — Telephone Encounter (Signed)
 I called and spoke with the pt  She states her breathing eventually improved enough that she did not need to go to ED  She was able to go grocery shopping yesterday and did not have much SOB  She is not wheezing She feels like she is pretty much back to baseline  Dr Annella has several openings this wk I offered ov for f/u and she states will call back to schedule  Nothing further needed at this time

## 2024-10-15 NOTE — Progress Notes (Signed)
 @Patient  ID: Sydney Gross, female    DOB: [birth year], 89 y.o.   MRN: [id number]  Chief Complaint  Patient presents with   Shortness of Breath    Pt complains of difficulty breathing w/ wheezing due to Albuterol .     Referring provider: Loreli Kins, MD  HPI:   89 y.o. woman whom are seeing in follow-up for evaluation of dyspnea on exertion.  Most recent cardiology note reviewed.  Most recent pulmonary note x 2 reviewed.  Most recent ED note x 3 reviewed.  Seen 07/2024 by Madelin George, NP.  For worsening dyspnea on exertion.  At that time she was in to be in tachycardia, SVT and recommended go to the ED.  She received cardioversion.  A flutter.  A.  Worsening shortness of breath.  Advised to try albuterol  as needed.  She was without albuterol  prescription.  She did have albuterol  nebulizer solution at home but did not use that.  With using albuterol  over the weekend things are much worse.  More symptomatic.  They endorse wheeze and shortness of breath.  On exam today her lungs are clear.  No wheezing.  Her heart rate is around 60-70 on my exam.  I am worried and discussed with them that albuterol  use could have precipitated runs of SVT or recurrent a flutter.  Not sure how easy would be to convert out of the A-flutter once started as typically a flutter is quite difficult to control.  Possibly she has developed paroxysmal A-fib.  She received a CT scan at the start of the symptoms on my review no parenchymal causes for her shortness of breath.  No PE as would be expected on someone on chronic apixaban .  Discussed needing cardiology evaluation to see if there are intermittent arrhythmias causing the symptoms.  HPI initial visit: Patient notes onset of cough back in the summer.  She was hospitalized July 2023.  CT scan at that time demonstrates no emphysema, scattered bronchiectasis right middle lobe and right lower lobe, thickened bronchioles, otherwise clear on my review and interpretation.   She was treated for COPD exacerbation with steroids.  Some concern for emphysema given wispy infiltrates felt to be atelectasis on my opinion.  She was given antibiotics.  Her cough gradually improved.  She was hospitalized again 09/2022 with GI bleeding.  Most recent hemoglobin in the nines.  Her cough is much improved.  Nearly gone.  He does have dyspnea exertion.  With inclines or stairs is worse.  She is limited in activity by musculoskeletal pain as well.  No times a day with things are better or worse.  No position make things better or worse.  No seasonal environmental factors she can identify to make things better or worse.  Has been using Breztri  2 puffs twice daily.  She feels like this has helped her symptoms.  Uses albuterol  and DuoNebs with improvement albeit temporary.  No other alleviating or exacerbating factors.  Her most recent TTE 09/2019 through showed dilated left atrium, mild MVR, mild AI, mild AS.  PMH: Atrial fibrillation, MVR, AI, AAS, hyperlipidemia, hypertension, GERD Surgical history: Hysterectomy appendectomy back surgery C-section cataract surgery cholecystectomy Family history: Father with emphysema, mother with CVA Social history: never smoker, lives in [place], lives with husband   Questionaires / Pulmonary Flowsheets:   ACT:      No data to display          MMRC:     No data to display  Epworth:      No data to display          Tests:   FENO:  No results found for: NITRICOXIDE  PFT:    Latest Ref Rng & Units 11/11/2022    2:56 PM  PFT Results  FVC-Pre L 1.23   FVC-Predicted Pre % 78   FVC-Post L 1.30   FVC-Predicted Post % 83   Pre FEV1/FVC % % 65   Post FEV1/FCV % % 69   FEV1-Pre L 0.80   FEV1-Predicted Pre % 71   FEV1-Post L 0.90   DLCO uncorrected ml/min/mmHg 11.56   DLCO UNC% % 75   DLCO corrected ml/min/mmHg 11.56   DLCO COR %Predicted % 75   DLVA Predicted % 87   TLC L 3.99   TLC % Predicted % 92    RV % Predicted % 105   Personally reviewed interpret as normal spirometry, no bronchodilator response, lung volumes within normal notes, DLCO within normal limits.  WALK:      No data to display          Imaging: Personally reviewed and as per EMR discussion of this note CT Angio Chest Pulmonary Embolism (PE) W or WO Contrast Result Date: 10/04/2024 EXAM: CTA of the Chest without and with contrast for PE 10/04/2024 02:08:14 PM TECHNIQUE: CTA of the chest was performed without and with the administration of 100 mL of iopamidol  (ISOVUE -300) 61% injection. Multiplanar reformatted images are provided for review. MIP images are provided for review. Automated exposure control, iterative reconstruction, and/or weight based adjustment of the mA/kV was utilized to reduce the radiation dose to as low as reasonably achievable. COMPARISON: X-ray 09/29/2024 and CT 08/24/2022. CLINICAL HISTORY: FINDINGS: PULMONARY ARTERIES: Pulmonary arteries are adequately opacified for evaluation. No pulmonary embolism. Main pulmonary artery is normal in caliber. MEDIASTINUM: The heart and pericardium demonstrate no acute abnormality. Coronary artery and aortic atherosclerotic calcification. LYMPH NODES: No mediastinal, hilar or axillary lymphadenopathy. LUNGS AND PLEURA: The lungs are without acute process. No focal consolidation or pulmonary edema. No pleural effusion or pneumothorax. 4 mm nodule in the right lower lobe on series 12 image 85 is stable since 2023 and benign. No follow up recommended. UPPER ABDOMEN: Limited images of the upper abdomen are unremarkable. SOFT TISSUES AND BONES: No acute bone or soft tissue abnormality. IMPRESSION: 1. No pulmonary embolism or acute pulmonary abnormality. Electronically signed by: Norman Gatlin MD 10/04/2024 02:29 PM EDT RP Workstation: [id number]   DG Chest Portable 1 View Result Date: 09/29/2024 CLINICAL DATA:  Non radiating chest pain and shortness of breath. EXAM: PORTABLE  CHEST 1 VIEW COMPARISON:  July 30, 2024 FINDINGS: The heart size and mediastinal contours are within normal limits. There is marked severity calcification of the aortic arch. No acute infiltrate, pleural effusion or pneumothorax is identified. There is marked severity dextroscoliosis of the lower thoracic and upper lumbar spine. No acute osseous abnormalities are identified. IMPRESSION: No active disease. Electronically Signed   By: Suzen Dials M.D.   On: 09/29/2024 12:07    Lab Results: Personally reviewed CBC     Component Value Date/Time   WBC 8.9 09/29/2024 1059   RBC 3.56 (L) 09/29/2024 1059   HGB 11.2 (L) 09/29/2024 1059   HGB 10.2 (L) 08/14/2021 0000   HCT 36.2 09/29/2024 1059   HCT 31.9 (L) 08/14/2021 0000   PLT 227 09/29/2024 1059   PLT 280 08/14/2021 0000   MCV 101.7 (H) 09/29/2024 1059   MCV 87 08/14/2021  0000   MCH 31.5 09/29/2024 1059   MCHC 30.9 09/29/2024 1059   RDW 13.3 09/29/2024 1059   RDW 15.8 (H) 08/14/2021 0000   LYMPHSABS 4.2 (H) 09/10/2022 1803   MONOABS 1.3 (H) 09/10/2022 1803   EOSABS 0.0 09/10/2022 1803   BASOSABS 0.0 09/10/2022 1803    BMET    Component Value Date/Time   NA 142 09/29/2024 1059   NA 142 08/14/2021 1634   K 4.1 09/29/2024 1059   CL 114 (H) 09/29/2024 1059   CO2 21 (L) 09/29/2024 1059   GLUCOSE 101 (H) 09/29/2024 1059   BUN 16 09/29/2024 1059   BUN 19 08/14/2021 1634   CREATININE 0.83 09/29/2024 1059   CALCIUM  9.1 09/29/2024 1059   GFRNONAA >60 09/29/2024 1059   GFRAA 36 (L) 01/29/2019 2115    BNP    Component Value Date/Time   BNP 70.4 08/16/2022 1637    ProBNP    Component Value Date/Time   PROBNP 645 08/14/2021 1634    Specialty Problems       Pulmonary Problems   Dyspnea on exertion   COPD (chronic obstructive pulmonary disease) (HCC)   COPD with acute exacerbation (HCC)    Allergies  Allergen Reactions   Albuterol  Shortness Of Breath   Brilinta  [Ticagrelor ] Shortness Of Breath   Codeine Nausea  And Vomiting   Darvon [Propoxyphene] Nausea Only   Lipitor [Atorvastatin] Other (See Comments)    MYALGIAS   Rosuvastatin  Other (See Comments)    MYALGIAS    Simvastatin Other (See Comments)    MYALGIAS    Other Other (See Comments)   Tramadol  Hcl Nausea Only    nausea (08/2019)    Immunization History  Administered Date(s) Administered   Fluad Quad(high Dose 65+) 09/15/2022   INFLUENZA, HIGH DOSE SEASONAL PF 10/08/2024   Influenza,inj,Quad PF,6+ Mos 12/11/2013   PFIZER(Purple Top)SARS-COV-2 Vaccination 12/26/2019, 01/13/2020   Tdap 11/08/2022    Past Medical History:  Diagnosis Date   Allergic rhinitis    Arthritis    maybe a little in my knees and back (07/17/2018)   Chronic lower back pain    Colitis 12/2013   never had this before   COPD (chronic obstructive pulmonary disease) (HCC)    questionable/Dr. Claudene (07/17/2018)   Coronary artery disease    GI bleed    H/O calcium  pyrophosphate deposition disease (CPPD)    Hiatal hernia    Hyperlipidemia    Hypertension    Mild mitral regurgitation    On home oxygen  therapy    I've used it 2 times in 2 yrs (07/17/2018)   PAD (peripheral artery disease)    PAF (paroxysmal atrial fibrillation) (HCC)    Pulmonary hypertension (HCC)    Shingles     Tobacco History: Social History   Tobacco Use  Smoking Status Never   Passive exposure: Past  Smokeless Tobacco Never   Counseling given: Not Answered   Continue to not smoke  Outpatient Encounter Medications as of 10/15/2024  Medication Sig   apixaban  (ELIQUIS ) 2.5 MG TABS tablet Take 1 tablet (2.5 mg total) by mouth 2 (two) times daily.   denosumab  (PROLIA ) 60 MG/ML SOSY injection Inject 60 mg as directed every 6 (six) months.   diphenhydramine -acetaminophen  (TYLENOL  PM) 25-500 MG TABS tablet Take 1 tablet by mouth at bedtime.   Evolocumab  (REPATHA  SURECLICK) 140 MG/ML SOAJ INJECT 1 PEN INTO THE SKIN EVERY 14 DAYS   ezetimibe  (ZETIA ) 10 MG tablet Take 1  tablet by mouth once daily  ipratropium (ATROVENT ) 0.02 % nebulizer solution Take 2.5 mLs (0.5 mg total) by nebulization every 6 (six) hours as needed for wheezing or shortness of breath.   lisinopril -hydrochlorothiazide  (ZESTORETIC ) 20-25 MG tablet Take 0.5 tablets by mouth in the morning.   Magnesium  Cl-Calcium  Carbonate (SLOW-MAG PO) Take 1 tablet by mouth 2 (two) times daily.   metoprolol  succinate (TOPROL -XL) 50 MG 24 hr tablet Take 1 tablet (50 mg total) by mouth daily.   nitroGLYCERIN  (NITROSTAT ) 0.4 MG SL tablet Place 0.4 mg under the tongue every 5 (five) minutes as needed for chest pain.   omeprazole (PRILOSEC) 20 MG capsule Take 1 capsule (20 mg total) by mouth daily.   ondansetron  (ZOFRAN -ODT) 8 MG disintegrating tablet Take 1 tablet (8 mg total) by mouth every 8 (eight) hours as needed for nausea or vomiting.   predniSONE  (DELTASONE ) 20 MG tablet Take 1 tablet (20 mg total) by mouth daily with breakfast for 5 days.   tiZANidine  (ZANAFLEX ) 2 MG tablet Take 2 mg by mouth at bedtime.   [DISCONTINUED] Budeson-Glycopyrrol-Formoterol  (BREZTRI  AEROSPHERE) 160-9-4.8 MCG/ACT AERO Inhale 2 puffs into the lungs 2 (two) times daily as needed (shortness of breath/ COPD).   albuterol  (VENTOLIN  HFA) 108 (90 Base) MCG/ACT inhaler Inhale 2 puffs into the lungs every 6 (six) hours as needed for shortness of breath. (Patient not taking: Reported on 10/15/2024)   benzonatate  (TESSALON ) 100 MG capsule Take 1 capsule (100 mg total) by mouth every 8 (eight) hours. (Patient not taking: Reported on 10/15/2024)   budesonide -glycopyrrolate-formoterol  (BREZTRI  AEROSPHERE) 160-9-4.8 MCG/ACT AERO inhaler Inhale 2 puffs into the lungs 2 (two) times daily.   clotrimazole  (MYCELEX ) 10 MG troche Take 1 tablet (10 mg total) by mouth 5 (five) times daily. (Patient not taking: Reported on 10/15/2024)   oseltamivir  (TAMIFLU ) 30 MG capsule Take 1 capsule (30 mg total) by mouth 2 (two) times daily. (Patient not taking:  Reported on 10/15/2024)   No facility-administered encounter medications on file as of 10/15/2024.     Review of Systems  Review of Systems  N/a Physical Exam  BP 120/76   Pulse 62   Temp 98.2 F (36.8 C)   Ht 5' (1.524 m)   Wt 106 lb (48.1 kg)   SpO2 98%   BMI 20.70 kg/m   Wt Readings from Last 5 Encounters:  10/15/24 106 lb (48.1 kg)  10/08/24 103 lb (46.7 kg)  09/29/24 98 lb (44.5 kg)  08/09/24 103 lb 9.6 oz (47 kg)  07/30/24 101 lb (45.8 kg)    BMI Readings from Last 5 Encounters:  10/15/24 20.70 kg/m  10/08/24 20.80 kg/m  09/29/24 19.79 kg/m  08/09/24 20.92 kg/m  07/30/24 20.40 kg/m     Physical Exam General: Sitting in chair, in no distress Eyes: No icterus Neck: Supple, no JVP Pulmonary: Clear, normal work of breathing Cardiovascular: warm, no edema Abdomen: Non-distended, bowel sounds present MSK: no synovitis, no joint effusion Neuro: Normal gait, no weakness Psych: Normal mood, full affect  Assessment & Plan:   DOE: Chronic dyspnea felt to be multifactorial related to multiple cardiac abnormalities, likely pulmonary hypertension, bronchiectasis, asthma.  More acute symptoms over last 2 weeks.  CT scan clear with no evidence of pulmonary embolism.  No clear pulmonary computer.  They do report wheeze so asthma is considered.  However this worsened with albuterol  use.  Pulmonary exam is clear, no tachycardia on exam today.  Do wonder about underlying intermittent arrhythmias contributing to sensation of dyspnea on exertion especially with her atrial  tachyarrhythmia history.  Will message cardiologist regarding long-term monitor or sooner evaluation.  Treatment of presumed asthma exacerbation and alternative asthma medicines as below.  Asthma/chronic bronchitis with acute exacerbation: Continue Breztri , new prescription for ipratropium/Atrovent  nebulizer.  Reported wheezing and dyspnea on exertion over the weekend and this morning.  No on exam  currently.  Worsening symptoms of dyspnea exertion with albuterol  use, this is placed on her allergy list today.  Bronchiectasis: Very mild, scattered.  Cough not a real issue today.  Consider antibiotics if cough worsens.   Return in about 4 weeks (around 11/12/2024).   Sydney JONELLE Beals, MD 10/15/2024  I spent 41 minutes in the care of the patient including face-to-face visit, review of records, coordination of care.

## 2024-11-05 ENCOUNTER — Encounter: Payer: Self-pay | Admitting: Pulmonary Disease

## 2024-11-05 ENCOUNTER — Ambulatory Visit: Admitting: Pulmonary Disease

## 2024-11-05 VITALS — BP 150/80 | HR 71 | Temp 97.7°F | Ht 60.0 in | Wt 103.0 lb

## 2024-11-05 DIAGNOSIS — J4540 Moderate persistent asthma, uncomplicated: Secondary | ICD-10-CM

## 2024-11-05 LAB — CBC WITH DIFFERENTIAL/PLATELET
Basophils Absolute: 0 K/uL (ref 0.0–0.1)
Basophils Relative: 0.3 % (ref 0.0–3.0)
Eosinophils Absolute: 0.1 K/uL (ref 0.0–0.7)
Eosinophils Relative: 1.6 % (ref 0.0–5.0)
HCT: 35.5 % — ABNORMAL LOW (ref 36.0–46.0)
Hemoglobin: 11.5 g/dL — ABNORMAL LOW (ref 12.0–15.0)
Lymphocytes Relative: 22.9 % (ref 12.0–46.0)
Lymphs Abs: 1.9 K/uL (ref 0.7–4.0)
MCHC: 32.5 g/dL (ref 30.0–36.0)
MCV: 95.9 fl (ref 78.0–100.0)
Monocytes Absolute: 0.6 K/uL (ref 0.1–1.0)
Monocytes Relative: 7 % (ref 3.0–12.0)
Neutro Abs: 5.5 K/uL (ref 1.4–7.7)
Neutrophils Relative %: 68.2 % (ref 43.0–77.0)
Platelets: 248 K/uL (ref 150.0–400.0)
RBC: 3.7 Mil/uL — ABNORMAL LOW (ref 3.87–5.11)
RDW: 14.8 % (ref 11.5–15.5)
WBC: 8.1 K/uL (ref 4.0–10.5)

## 2024-11-05 MED ORDER — PREDNISONE 20 MG PO TABS: 20.0000 mg | ORAL_TABLET | Freq: Every day | ORAL | 0 refills | Status: AC

## 2024-11-05 NOTE — Progress Notes (Signed)
 @Patient  ID: Sydney Gross, female    DOB: Dec 22, 1929, 89 y.o.   MRN: [id number]  Chief Complaint  Patient presents with   Asthma    Breathing is worse with exertion and at HS.  Using rescue inhaler, does not get relief with inhaler.  Needs new nebulizer.    Referring provider: Loreli Kins, MD  HPI:   89 y.o. woman with asthma and mild bronchiectasis whom are seeing in follow-up for evaluation of dyspnea on exertion.    At last visit was given prednisone  for ongoing symptoms.  Concern for intermittent atrial arrhythmia given a flutter was diagnosed in the past.  She continues to be short of breath despite triple inhaler therapy.  Ipratropium was prescribed as albuterol  made her feel worse breathing worse with suspected question of inducing arrhythmia.  Has used ipratropium a little bit.  Not sure it helped much.  She request new nebulizer machine as her old nebulizer is old and not working well per her report.  We discussed biologic therapy but discussed other etiology for symptoms which additional medicines from a pulmonary perspective will not help.  We discussed that maybe no more medicines from a pulmonary perspective even if pulmonary is primary driver could help.  We discussed pulmonary rehab as I do think there is some deconditioning at play with her worsening symptoms over the last several weeks.  HPI initial visit: Patient notes onset of cough back in the summer.  She was hospitalized July 2023.  CT scan at that time demonstrates no emphysema, scattered bronchiectasis right middle lobe and right lower lobe, thickened bronchioles, otherwise clear on my review and interpretation.  She was treated for COPD exacerbation with steroids.  Some concern for emphysema given wispy infiltrates felt to be atelectasis on my opinion.  She was given antibiotics.  Her cough gradually improved.  She was hospitalized again 09/2022 with GI bleeding.  Most recent hemoglobin in the nines.  Her  cough is much improved.  Nearly gone.  He does have dyspnea exertion.  With inclines or stairs is worse.  She is limited in activity by musculoskeletal pain as well.  No times a day with things are better or worse.  No position make things better or worse.  No seasonal environmental factors she can identify to make things better or worse.  Has been using Breztri  2 puffs twice daily.  She feels like this has helped her symptoms.  Uses albuterol  and DuoNebs with improvement albeit temporary.  No other alleviating or exacerbating factors.  Her most recent TTE 09/2019 through showed dilated left atrium, mild MVR, mild AI, mild AS.  PMH: Atrial fibrillation, MVR, AI, AAS, hyperlipidemia, hypertension, GERD Surgical history: Hysterectomy appendectomy back surgery C-section cataract surgery cholecystectomy Family history: Father with emphysema, mother with CVA Social history: never smoker, lives in [place], lives with husband   Questionaires / Pulmonary Flowsheets:   ACT:  Asthma Control Test ACT Total Score  11/05/2024  9:20 AM 14    MMRC:     No data to display          Epworth:      No data to display          Tests:   FENO:  No results found for: NITRICOXIDE  PFT:    Latest Ref Rng & Units 11/11/2022    2:56 PM  PFT Results  FVC-Pre L 1.23   FVC-Predicted Pre % 78   FVC-Post L 1.30   FVC-Predicted Post %  83   Pre FEV1/FVC % % 65   Post FEV1/FCV % % 69   FEV1-Pre L 0.80   FEV1-Predicted Pre % 71   FEV1-Post L 0.90   DLCO uncorrected ml/min/mmHg 11.56   DLCO UNC% % 75   DLCO corrected ml/min/mmHg 11.56   DLCO COR %Predicted % 75   DLVA Predicted % 87   TLC L 3.99   TLC % Predicted % 92   RV % Predicted % 105   Personally reviewed interpret as normal spirometry, no bronchodilator response, lung volumes within normal notes, DLCO within normal limits.  WALK:      No data to display          Imaging: Personally reviewed and as per EMR discussion of  this note No results found.   Lab Results: Personally reviewed CBC     Component Value Date/Time   WBC 8.9 09/29/2024 1059   RBC 3.56 (L) 09/29/2024 1059   HGB 11.2 (L) 09/29/2024 1059   HGB 10.2 (L) 08/14/2021 0000   HCT 36.2 09/29/2024 1059   HCT 31.9 (L) 08/14/2021 0000   PLT 227 09/29/2024 1059   PLT 280 08/14/2021 0000   MCV 101.7 (H) 09/29/2024 1059   MCV 87 08/14/2021 0000   MCH 31.5 09/29/2024 1059   MCHC 30.9 09/29/2024 1059   RDW 13.3 09/29/2024 1059   RDW 15.8 (H) 08/14/2021 0000   LYMPHSABS 4.2 (H) 09/10/2022 1803   MONOABS 1.3 (H) 09/10/2022 1803   EOSABS 0.0 09/10/2022 1803   BASOSABS 0.0 09/10/2022 1803    BMET    Component Value Date/Time   NA 142 09/29/2024 1059   NA 142 08/14/2021 1634   K 4.1 09/29/2024 1059   CL 114 (H) 09/29/2024 1059   CO2 21 (L) 09/29/2024 1059   GLUCOSE 101 (H) 09/29/2024 1059   BUN 16 09/29/2024 1059   BUN 19 08/14/2021 1634   CREATININE 0.83 09/29/2024 1059   CALCIUM  9.1 09/29/2024 1059   GFRNONAA >60 09/29/2024 1059   GFRAA 36 (L) 01/29/2019 2115    BNP    Component Value Date/Time   BNP 70.4 08/16/2022 1637    ProBNP    Component Value Date/Time   PROBNP 645 08/14/2021 1634    Specialty Problems       Pulmonary Problems   Dyspnea on exertion   Moderate persistent asthma with acute exacerbation    Allergies  Allergen Reactions   Albuterol  Shortness Of Breath   Brilinta  [Ticagrelor ] Shortness Of Breath   Codeine Nausea And Vomiting   Darvon [Propoxyphene] Nausea Only   Lipitor [Atorvastatin] Other (See Comments)    MYALGIAS   Rosuvastatin  Other (See Comments)    MYALGIAS    Simvastatin Other (See Comments)    MYALGIAS    Other Other (See Comments)   Tramadol  Hcl Nausea Only    nausea (08/2019)    Immunization History  Administered Date(s) Administered   Fluad Quad(high Dose 65+) 09/15/2022   INFLUENZA, HIGH DOSE SEASONAL PF 10/08/2024   Influenza,inj,Quad PF,6+ Mos 12/11/2013    PFIZER(Purple Top)SARS-COV-2 Vaccination 12/26/2019, 01/13/2020   Tdap 11/08/2022    Past Medical History:  Diagnosis Date   Allergic rhinitis    Arthritis    maybe a little in my knees and back (07/17/2018)   Chronic lower back pain    Colitis 12/2013   never had this before   COPD (chronic obstructive pulmonary disease) (HCC)    questionable/Dr. Claudene (07/17/2018)   Coronary artery disease  GI bleed    H/O calcium  pyrophosphate deposition disease (CPPD)    Hiatal hernia    Hyperlipidemia    Hypertension    Mild mitral regurgitation    On home oxygen  therapy    I've used it 2 times in 2 yrs (07/17/2018)   PAD (peripheral artery disease)    PAF (paroxysmal atrial fibrillation) (HCC)    Pulmonary hypertension (HCC)    Shingles     Tobacco History: Social History   Tobacco Use  Smoking Status Never   Passive exposure: Past  Smokeless Tobacco Never   Counseling given: Not Answered   Continue to not smoke  Outpatient Encounter Medications as of 11/05/2024  Medication Sig   albuterol  (VENTOLIN  HFA) 108 (90 Base) MCG/ACT inhaler Inhale 2 puffs into the lungs every 6 (six) hours as needed for shortness of breath.   apixaban  (ELIQUIS ) 2.5 MG TABS tablet Take 1 tablet (2.5 mg total) by mouth 2 (two) times daily.   benzonatate  (TESSALON ) 100 MG capsule Take 1 capsule (100 mg total) by mouth every 8 (eight) hours.   budesonide -glycopyrrolate-formoterol  (BREZTRI  AEROSPHERE) 160-9-4.8 MCG/ACT AERO inhaler Inhale 2 puffs into the lungs 2 (two) times daily.   budesonide -glycopyrrolate-formoterol  (BREZTRI  AEROSPHERE) 160-9-4.8 MCG/ACT AERO inhaler Inhale 2 puffs into the lungs in the morning and at bedtime.   clotrimazole  (MYCELEX ) 10 MG troche Take 1 tablet (10 mg total) by mouth 5 (five) times daily.   denosumab  (PROLIA ) 60 MG/ML SOSY injection Inject 60 mg as directed every 6 (six) months.   diphenhydramine -acetaminophen  (TYLENOL  PM) 25-500 MG TABS tablet Take 1 tablet by  mouth at bedtime.   Evolocumab  (REPATHA  SURECLICK) 140 MG/ML SOAJ INJECT 1 PEN INTO THE SKIN EVERY 14 DAYS   ezetimibe  (ZETIA ) 10 MG tablet Take 1 tablet by mouth once daily   ipratropium (ATROVENT ) 0.02 % nebulizer solution Take 2.5 mLs (0.5 mg total) by nebulization every 6 (six) hours as needed for wheezing or shortness of breath.   lisinopril -hydrochlorothiazide  (ZESTORETIC ) 20-25 MG tablet Take 0.5 tablets by mouth in the morning.   Magnesium  Cl-Calcium  Carbonate (SLOW-MAG PO) Take 1 tablet by mouth 2 (two) times daily.   metoprolol  succinate (TOPROL -XL) 50 MG 24 hr tablet Take 1 tablet (50 mg total) by mouth daily.   nitroGLYCERIN  (NITROSTAT ) 0.4 MG SL tablet Place 0.4 mg under the tongue every 5 (five) minutes as needed for chest pain.   omeprazole  (PRILOSEC) 20 MG capsule Take 1 capsule (20 mg total) by mouth daily.   ondansetron  (ZOFRAN -ODT) 8 MG disintegrating tablet Take 1 tablet (8 mg total) by mouth every 8 (eight) hours as needed for nausea or vomiting.   oseltamivir  (TAMIFLU ) 30 MG capsule Take 1 capsule (30 mg total) by mouth 2 (two) times daily.   predniSONE  (DELTASONE ) 20 MG tablet Take 1 tablet (20 mg total) by mouth daily with breakfast for 5 days.   tiZANidine  (ZANAFLEX ) 2 MG tablet Take 2 mg by mouth at bedtime.   No facility-administered encounter medications on file as of 11/05/2024.     Review of Systems  Review of Systems  N/a Physical Exam  BP (!) 150/80 (BP Location: Left Arm, Patient Position: Sitting)   Pulse 71   Temp 97.7 F (36.5 C) (Oral)   Ht 5' (1.524 m)   Wt 103 lb (46.7 kg)   SpO2 94% Comment: RA  BMI 20.12 kg/m   Wt Readings from Last 5 Encounters:  11/05/24 103 lb (46.7 kg)  10/15/24 106 lb (48.1 kg)  10/08/24  103 lb (46.7 kg)  09/29/24 98 lb (44.5 kg)  08/09/24 103 lb 9.6 oz (47 kg)    BMI Readings from Last 5 Encounters:  11/05/24 20.12 kg/m  10/15/24 20.70 kg/m  10/08/24 20.80 kg/m  09/29/24 19.79 kg/m  08/09/24 20.92 kg/m      Physical Exam General: Appears younger than stated age, no distress Eyes: EOMI Neck: No JVP Pulmonary: Good air excursion, no work of breathing, clear bilaterally Cardiovascular: No edema noted, regular rate and rhythm Abdomen: Nondistended MSK: no synovitis, no joint effusion  Assessment & Plan:   DOE: Chronic dyspnea felt to be multifactorial related to multiple cardiac abnormalities, likely pulmonary hypertension, bronchiectasis, asthma.  Concern for ongoing asthma flare exacerbation over the last several weeks.  But even with treatment of this, she would like her asthma is better controlled but she is more short of breath.  I think deconditioning certainly can play a role.  We discussed pulmonary rehab, escalating asthma therapies.  Discussed using rescue medication, ipratropium more frequently to see if that improves symptoms etc.  Asthma/chronic bronchitis: Continue Breztri .  Ongoing dyspnea despite triple inhaled therapy.  Encouraged more frequent rescue use, ipratropium use.  Additional prednisone  taper today.  She felt more invigorated, more energy, may be breathing is better as well with prednisone  but hard to say.  We discussed biologic therapy, phenotyping, blood work today.  With plan for making decision on biologic therapy in the next few weeks.  She declined referral to pulmonary rehab today.  Bronchiectasis: Very mild, scattered.  Cough not a real issue today.  Consider antibiotics if cough worsens.   Return in about 3 months (around 02/03/2025) for f/u Dr. Annella.   Donnice JONELLE Annella, MD 11/05/2024  I spent 42 minutes in the care of the patient including face-to-face visit, review of records, coordination of care.

## 2024-11-05 NOTE — Patient Instructions (Signed)
 Nice to see you again  Take prednisone  as prescribed  New order for nebulizer machine, use the ipratropium nebulizer couple times a day to see if this helps  Blood work today to consider new medicines, injection medicines, Biologics, for treatment of asthma that could be causing your shortness of breath.  Consider pulmonary rehab, exercise program to increase endurance and help with breathing.  Return to clinic in 3 months or sooner as needed with Dr. Annella

## 2024-11-06 LAB — IGE: IgE (Immunoglobulin E), Serum: 28 kU/L (ref <=114)

## 2024-11-07 DIAGNOSIS — J4540 Moderate persistent asthma, uncomplicated: Secondary | ICD-10-CM | POA: Diagnosis not present

## 2024-11-08 LAB — ALLERGEN PROFILE, PERENNIAL ALLERGEN IGE
Alternaria Alternata IgE: <0.1 kU/L
Aspergillus Fumigatus IgE: <0.1 kU/L
Aureobasidi Pullulans IgE: <0.1 kU/L
Candida Albicans IgE: <0.1 kU/L
Cat Dander IgE: <0.1 kU/L
Chicken Feathers IgE: <0.1 kU/L
Cladosporium Herbarum IgE: <0.1 kU/L
Cow Dander IgE: <0.1 kU/L
D Farinae IgE: <0.1 kU/L
D Pteronyssinus IgE: 0.12 kU/L — AB
Dog Dander IgE: <0.1 kU/L
Duck Feathers IgE: <0.1 kU/L
Goose Feathers IgE: <0.1 kU/L
Mouse Urine IgE: <0.1 kU/L
Mucor Racemosus IgE: <0.1 kU/L
Penicillium Chrysogen IgE: <0.1 kU/L
Phoma Betae IgE: <0.1 kU/L
Setomelanomma Rostrat: <0.1 kU/L
Stemphylium Herbarum IgE: <0.1 kU/L

## 2025-01-02 ENCOUNTER — Other Ambulatory Visit: Payer: Self-pay

## 2025-01-02 ENCOUNTER — Emergency Department (HOSPITAL_COMMUNITY)

## 2025-01-02 ENCOUNTER — Emergency Department (HOSPITAL_COMMUNITY): Admission: EM | Admit: 2025-01-02 | Discharge: 2025-01-02 | Disposition: A | Discharge status: Home / Self Care

## 2025-01-02 ENCOUNTER — Encounter (HOSPITAL_COMMUNITY): Payer: Self-pay | Admitting: *Deleted

## 2025-01-02 DIAGNOSIS — Z7951 Long term (current) use of inhaled steroids: Secondary | ICD-10-CM | POA: Insufficient documentation

## 2025-01-02 DIAGNOSIS — Z7901 Long term (current) use of anticoagulants: Secondary | ICD-10-CM | POA: Insufficient documentation

## 2025-01-02 DIAGNOSIS — N12 Tubulo-interstitial nephritis, not specified as acute or chronic: Secondary | ICD-10-CM | POA: Insufficient documentation

## 2025-01-02 DIAGNOSIS — I10 Essential (primary) hypertension: Secondary | ICD-10-CM | POA: Diagnosis not present

## 2025-01-02 DIAGNOSIS — I482 Chronic atrial fibrillation, unspecified: Secondary | ICD-10-CM | POA: Diagnosis not present

## 2025-01-02 DIAGNOSIS — R1032 Left lower quadrant pain: Secondary | ICD-10-CM | POA: Diagnosis present

## 2025-01-02 DIAGNOSIS — J449 Chronic obstructive pulmonary disease, unspecified: Secondary | ICD-10-CM | POA: Diagnosis not present

## 2025-01-02 DIAGNOSIS — Z79899 Other long term (current) drug therapy: Secondary | ICD-10-CM | POA: Diagnosis not present

## 2025-01-02 LAB — URINALYSIS, ROUTINE W REFLEX MICROSCOPIC
Bilirubin Urine: NEGATIVE
Glucose, UA: NEGATIVE mg/dL
Hgb urine dipstick: NEGATIVE
Ketones, ur: NEGATIVE mg/dL
Nitrite: POSITIVE — AB
Protein, ur: 100 mg/dL — AB
Specific Gravity, Urine: 1.026 (ref 1.005–1.030)
WBC, UA: >50 WBC/hpf (ref 0–5)
pH: 7 (ref 5.0–8.0)

## 2025-01-02 LAB — CBC
HCT: 42.4 % (ref 36.0–46.0)
Hemoglobin: 12.7 g/dL (ref 12.0–15.0)
MCH: 30.1 pg (ref 26.0–34.0)
MCHC: 30 g/dL (ref 30.0–36.0)
MCV: 100.5 fL — ABNORMAL HIGH (ref 80.0–100.0)
Platelets: 249 10*3/uL (ref 150–400)
RBC: 4.22 MIL/uL (ref 3.87–5.11)
RDW: 14.6 % (ref 11.5–15.5)
WBC: 8.7 10*3/uL (ref 4.0–10.5)
nRBC: 0 % (ref 0.0–0.2)

## 2025-01-02 LAB — COMPREHENSIVE METABOLIC PANEL WITH GFR
ALT: 9 U/L (ref 0–44)
AST: 21 U/L (ref 15–41)
Albumin: 4.4 g/dL (ref 3.5–5.0)
Alkaline Phosphatase: 48 U/L (ref 38–126)
Anion gap: 11 (ref 5–15)
BUN: 16 mg/dL (ref 8–23)
CO2: 24 mmol/L (ref 22–32)
Calcium: 9.7 mg/dL (ref 8.9–10.3)
Chloride: 108 mmol/L (ref 98–111)
Creatinine, Ser: 0.71 mg/dL (ref 0.44–1.00)
GFR, Estimated: >60 mL/min
Glucose, Bld: 94 mg/dL (ref 70–99)
Potassium: 3.8 mmol/L (ref 3.5–5.1)
Sodium: 143 mmol/L (ref 135–145)
Total Bilirubin: 0.5 mg/dL (ref 0.0–1.2)
Total Protein: 6.9 g/dL (ref 6.5–8.1)

## 2025-01-02 LAB — LIPASE, BLOOD: Lipase: 40 U/L (ref 11–51)

## 2025-01-02 MED ORDER — IOHEXOL 300 MG/ML  SOLN
100.0000 mL | Freq: Once | INTRAMUSCULAR | Status: AC | PRN
  Administered 2025-01-02: 100 mL via INTRAVENOUS

## 2025-01-02 MED ORDER — CEFPODOXIME PROXETIL 200 MG PO TABS: 200.0000 mg | ORAL_TABLET | Freq: Two times a day (BID) | ORAL | 0 refills | Status: AC

## 2025-01-02 MED ORDER — SODIUM CHLORIDE 0.9 % IV SOLN
2.0000 g | Freq: Once | INTRAVENOUS | Status: AC
  Administered 2025-01-02: 2 g via INTRAVENOUS
  Filled 2025-01-02: qty 20

## 2025-01-02 NOTE — ED Triage Notes (Signed)
 LLQ abdominal pain for a month.  Pt states that the pain is deep and pt has some burning with urination.

## 2025-01-02 NOTE — Discharge Instructions (Addendum)
 Please take the cefpodoxime  twice daily.  Take the Tylenol  4 times daily and follow-up with your doctor.  Return to the ER for worsening symptoms.

## 2025-01-02 NOTE — ED Provider Notes (Signed)
 " Sydney Gross EMERGENCY DEPARTMENT AT [facility] Provider Note   CSN: [id number] Arrival date & time: 01/02/25  1245     Patient presents with: Abdominal Pain   Sydney Gross is a 89 y.o. female.   89 year old female with past medical history of COPD, hypertension, and atrial fibrillation on Eliquis  presenting to the emergency department today with lower abdominal pain.  The patient states that she has been having some intermittent left lower quadrant abdominal pain over the past few weeks.  Denies any associated nausea or vomiting.  She has had some increased urinary frequency and urgency as well as some dysuria.  She states the pain is in the left lower quadrant and comes and goes.  She states that she has been having relatively normal bowel movements for her.  Denies any blood in her stool or dark stools.   Abdominal Pain      Prior to Admission medications  Medication Sig Start Date End Date Taking? Authorizing Provider  cefpodoxime  (VANTIN ) 200 MG tablet Take 1 tablet (200 mg total) by mouth 2 (two) times daily. 01/02/25  Yes Ula Prentice SAUNDERS, MD  albuterol  (VENTOLIN  HFA) 108 (90 Base) MCG/ACT inhaler Inhale 2 puffs into the lungs every 6 (six) hours as needed for shortness of breath. 10/08/24   Charley Conger, PA-C  apixaban  (ELIQUIS ) 2.5 MG TABS tablet Take 1 tablet (2.5 mg total) by mouth 2 (two) times daily. 08/09/24   Tolia, Sunit, DO  benzonatate  (TESSALON ) 100 MG capsule Take 1 capsule (100 mg total) by mouth every 8 (eight) hours. 12/09/23   Randol Simmonds, MD  budesonide -glycopyrrolate-formoterol  (BREZTRI  AEROSPHERE) 160-9-4.8 MCG/ACT AERO inhaler Inhale 2 puffs into the lungs 2 (two) times daily. 10/15/24   Hunsucker, Donnice SAUNDERS, MD  budesonide -glycopyrrolate-formoterol  (BREZTRI  AEROSPHERE) 160-9-4.8 MCG/ACT AERO inhaler Inhale 2 puffs into the lungs in the morning and at bedtime. 10/15/24   Hunsucker, Donnice SAUNDERS, MD  clotrimazole  (MYCELEX ) 10 MG troche Take 1 tablet (10 mg  total) by mouth 5 (five) times daily. 07/30/24   Parrett, Madelin RAMAN, NP  denosumab  (PROLIA ) 60 MG/ML SOSY injection Inject 60 mg as directed every 6 (six) months.    [provider]  diphenhydramine -acetaminophen  (TYLENOL  PM) 25-500 MG TABS tablet Take 1 tablet by mouth at bedtime.    [provider]  Evolocumab  (REPATHA  SURECLICK) 140 MG/ML SOAJ INJECT 1 PEN INTO THE SKIN EVERY 14 DAYS 12/14/23   Swinyer, Rosaline HERO, NP  ezetimibe  (ZETIA ) 10 MG tablet Take 1 tablet by mouth once daily 05/15/24   Swinyer, Rosaline HERO, NP  ipratropium (ATROVENT ) 0.02 % nebulizer solution Take 2.5 mLs (0.5 mg total) by nebulization every 6 (six) hours as needed for wheezing or shortness of breath. 10/15/24   Hunsucker, Donnice SAUNDERS, MD  lisinopril -hydrochlorothiazide  (ZESTORETIC ) 20-25 MG tablet Take 0.5 tablets by mouth in the morning. 06/18/22   Johnson, Kathleen R, PA-C  Magnesium  Cl-Calcium  Carbonate (SLOW-MAG PO) Take 1 tablet by mouth 2 (two) times daily.    [provider]  metoprolol  succinate (TOPROL -XL) 50 MG 24 hr tablet Take 1 tablet (50 mg total) by mouth daily. 08/09/24   Tolia, Sunit, DO  nitroGLYCERIN  (NITROSTAT ) 0.4 MG SL tablet Place 0.4 mg under the tongue every 5 (five) minutes as needed for chest pain.    [provider]  omeprazole  (PRILOSEC) 20 MG capsule Take 1 capsule (20 mg total) by mouth daily. 10/08/24   Charley Conger, PA-C  ondansetron  (ZOFRAN -ODT) 8 MG disintegrating tablet Take 1 tablet (  8 mg total) by mouth every 8 (eight) hours as needed for nausea or vomiting. 12/09/23   Randol Simmonds, MD  oseltamivir  (TAMIFLU ) 30 MG capsule Take 1 capsule (30 mg total) by mouth 2 (two) times daily. 12/12/23     tiZANidine  (ZANAFLEX ) 2 MG tablet Take 2 mg by mouth at bedtime. 11/17/22   [provider]    Allergies: Albuterol , Brilinta  [ticagrelor ], Codeine, Darvon [propoxyphene], Lipitor [atorvastatin], Rosuvastatin , Simvastatin, Other, and Tramadol  hcl    Review of Systems   Gastrointestinal:  Positive for abdominal pain.  All other systems reviewed and are negative.   Updated Vital Signs BP (!) 158/65 (BP Location: Right Arm)   Pulse 61   Temp 97.9 F (36.6 C) (Oral)   Resp 16   SpO2 96%   Physical Exam Vitals and nursing note reviewed.   Gen: NAD Eyes: PERRL, EOMI HEENT: no oropharyngeal swelling Neck: trachea midline Resp: clear to auscultation bilaterally Card: RRR, no murmurs, rubs, or gallops Abd: Tender over left lower quadrant with no guarding or rebound Extremities: no calf tenderness, no edema Vascular: 2+ radial pulses bilaterally, 2+ DP pulses bilaterally Skin: no rashes Psyc: acting appropriately   (all labs ordered are listed, but only abnormal results are displayed) Labs Reviewed  CBC - Abnormal; Notable for the following components:      Result Value   MCV 100.5 (*)    All other components within normal limits  URINALYSIS, ROUTINE W REFLEX MICROSCOPIC - Abnormal; Notable for the following components:   APPearance CLOUDY (*)    Protein, ur 100 (*)    Nitrite POSITIVE (*)    Leukocytes,Ua LARGE (*)    Bacteria, UA RARE (*)    All other components within normal limits  LIPASE, BLOOD  COMPREHENSIVE METABOLIC PANEL WITH GFR    EKG: None  Radiology: CT ABDOMEN PELVIS W CONTRAST Result Date: 01/02/2025 EXAM: CT ABDOMEN AND PELVIS WITH CONTRAST 01/02/2025 04:30:11 PM TECHNIQUE: Computed tomography of the abdomen and pelvis was performed with the administration of 100 mL of iohexol  (OMNIPAQUE ) 300 MG/ML solution. Multiplanar reformatted images are provided for review. Automated exposure control, iterative reconstruction, and/or weight-based adjustment of the mA/kV was utilized to reduce the radiation dose to as low as reasonably achievable. COMPARISON: 09/10/2022 CLINICAL HISTORY: Acute, nonlocalized abdominal pain, with left lower quadrant abdominal pain. FINDINGS: LOWER CHEST: No acute abnormality. LIVER: Subcentimeter  hypodensity in the lateral left hepatic lobe, too small to definitively characterize, but likely a small cyst or biliary hematoma. GALLBLADDER AND BILE DUCTS: Cholecystectomy. No biliary ductal dilatation. SPLEEN: No acute abnormality. PANCREAS: No acute abnormality. ADRENAL GLANDS: No acute abnormality. KIDNEYS, URETERS AND BLADDER: Mild renal cortical atrophy throughout the left kidney. Unchanged right lower pole cyst measuring 5 cm. 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 No stones in the kidneys or ureters. No hydronephrosis. No perinephric or periureteral stranding. The urinary bladder is partially distended, without focal abnormality. GI AND BOWEL: Stomach demonstrates no acute abnormality. Descending and sigmoid colonic diverticulosis. No changes of acute diverticulitis. The appendix was not visualized. No changes to suggest acute appendicitis. There is no bowel obstruction. PERITONEUM AND RETROPERITONEUM: No ascites. No free air. No free pelvic fluid. VASCULATURE: Aorta is normal in caliber. 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 No adjacent fluid collection or inflammation. Diffuse aortoiliac atherosclerosis. 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 The left common iliac stent  is patent. Peripheral vascular disease also noted in the thighs. LYMPH NODES: No lymphadenopathy. REPRODUCTIVE ORGANS: Hysterectomy. BONES AND SOFT TISSUES: Moderate dextrocurvature of the thoracolumbar spine with multilevel degenerative disc disease. Osteopenia. No acute osseous abnormality. No focal soft tissue abnormality. IMPRESSION: 1. No acute findings in the abdomen or pelvis. 2. Descending and sigmoid colonic diverticulosis. No changes of acute diverticulitis. Electronically signed by: Rogelia Myers MD 01/02/2025 05:43 PM EST RP Workstation: [id number]     Procedures    Medications Ordered in the ED  iohexol  (OMNIPAQUE ) 300 MG/ML solution 100 mL (100 mLs Intravenous Contrast Given 01/02/25 1622)  cefTRIAXone  (ROCEPHIN ) 2 g in sodium chloride  0.9 % 100 mL IVPB (0 g Intravenous Stopped 01/02/25 1730)                                    Medical Decision Making 89 year old female with past medical history of COPD, hypertension, atrial fibrillation on Eliquis  presenting to the emergency department today with abdominal pain.  I will further evaluate the patient here with basic labs including LFTs and a lipase to evaluate for hepatobiliary otology or pancreatitis.  Will obtain a urinalysis to evaluate for UTI/pyelonephritis as well as a CT scan to evaluate for ureterolithiasis or diverticulitis.  The patient denies any pain currently.  Will reevaluate for ultimate disposition.  The patient's work appears reassuring.  Labs are unremarkable.  Urinalysis is positive for UTI.  The patient is given Rocephin .  CT scan does not show any acute findings.  The patient remained stable here.  I think that she is appropriate for discharge with outpatient management.  She is discharged with return precautions.  Amount and/or Complexity of Data Reviewed Labs: ordered. Radiology: ordered.  Risk Prescription drug management.        Final diagnoses:  Pyelonephritis    ED Discharge Orders          Ordered    cefpodoxime  (VANTIN ) 200 MG tablet  2 times daily        01/02/25 1813               Ula Prentice SAUNDERS, MD 01/02/25 1814  "

## 2025-02-04 ENCOUNTER — Ambulatory Visit (HOSPITAL_COMMUNITY)

## 2025-02-05 ENCOUNTER — Ambulatory Visit: Admitting: Pulmonary Disease
